# Patient Record
Sex: Male | Born: 1945 | Race: White | Hispanic: No | Marital: Married | Smoking: Current every day smoker
Health system: Southern US, Community
[De-identification: ages and names within clinical notes are randomized; demographics above are authoritative.]

## PROBLEM LIST (undated history)

## (undated) DIAGNOSIS — M1990 Unspecified osteoarthritis, unspecified site: Secondary | ICD-10-CM

## (undated) DIAGNOSIS — M519 Unspecified thoracic, thoracolumbar and lumbosacral intervertebral disc disorder: Secondary | ICD-10-CM

## (undated) DIAGNOSIS — E7800 Pure hypercholesterolemia, unspecified: Secondary | ICD-10-CM

## (undated) DIAGNOSIS — N529 Male erectile dysfunction, unspecified: Secondary | ICD-10-CM

## (undated) DIAGNOSIS — J449 Chronic obstructive pulmonary disease, unspecified: Secondary | ICD-10-CM

## (undated) DIAGNOSIS — E119 Type 2 diabetes mellitus without complications: Secondary | ICD-10-CM

## (undated) DIAGNOSIS — F329 Major depressive disorder, single episode, unspecified: Secondary | ICD-10-CM

## (undated) DIAGNOSIS — I10 Essential (primary) hypertension: Secondary | ICD-10-CM

## (undated) DIAGNOSIS — F32A Depression, unspecified: Secondary | ICD-10-CM

## (undated) DIAGNOSIS — R0600 Dyspnea, unspecified: Secondary | ICD-10-CM

## (undated) DIAGNOSIS — G629 Polyneuropathy, unspecified: Secondary | ICD-10-CM

## (undated) HISTORY — DX: Polyneuropathy, unspecified: G62.9

## (undated) HISTORY — DX: Unspecified thoracic, thoracolumbar and lumbosacral intervertebral disc disorder: M51.9

## (undated) HISTORY — DX: Pure hypercholesterolemia, unspecified: E78.00

## (undated) HISTORY — PX: KNEE SURGERY: SHX244

## (undated) HISTORY — DX: Essential (primary) hypertension: I10

## (undated) HISTORY — PX: ANTERIOR CERVICAL DECOMP/DISCECTOMY FUSION: SHX1161

## (undated) HISTORY — PX: HEMORRHOID SURGERY: SHX153

## (undated) HISTORY — PX: JOINT REPLACEMENT: SHX530

## (undated) HISTORY — PX: APPENDECTOMY: SHX54

## (undated) HISTORY — PX: TOTAL KNEE ARTHROPLASTY: SHX125

## (undated) HISTORY — DX: Male erectile dysfunction, unspecified: N52.9

---

## 1990-07-22 HISTORY — PX: OTHER SURGICAL HISTORY: SHX169

## 1998-02-10 ENCOUNTER — Inpatient Hospital Stay (HOSPITAL_COMMUNITY): Admission: RE | Admit: 1998-02-10 | Discharge: 1998-02-12 | Payer: Self-pay | Admitting: Specialist

## 1999-01-30 ENCOUNTER — Encounter: Payer: Self-pay | Admitting: Specialist

## 1999-02-02 ENCOUNTER — Inpatient Hospital Stay (HOSPITAL_COMMUNITY): Admission: RE | Admit: 1999-02-02 | Discharge: 1999-02-07 | Payer: Self-pay | Admitting: Specialist

## 1999-02-02 ENCOUNTER — Encounter: Payer: Self-pay | Admitting: Specialist

## 1999-02-09 ENCOUNTER — Ambulatory Visit: Admission: RE | Admit: 1999-02-09 | Discharge: 1999-02-09 | Payer: Self-pay | Admitting: Specialist

## 1999-02-28 ENCOUNTER — Encounter: Admission: RE | Admit: 1999-02-28 | Discharge: 1999-05-29 | Payer: Self-pay | Admitting: Specialist

## 1999-03-05 ENCOUNTER — Ambulatory Visit (HOSPITAL_COMMUNITY): Admission: RE | Admit: 1999-03-05 | Discharge: 1999-03-05 | Payer: Self-pay | Admitting: Specialist

## 2000-01-09 ENCOUNTER — Encounter: Payer: Self-pay | Admitting: Specialist

## 2000-01-09 ENCOUNTER — Ambulatory Visit (HOSPITAL_COMMUNITY): Admission: RE | Admit: 2000-01-09 | Discharge: 2000-01-09 | Payer: Self-pay | Admitting: Specialist

## 2000-01-14 ENCOUNTER — Ambulatory Visit (HOSPITAL_COMMUNITY): Admission: RE | Admit: 2000-01-14 | Discharge: 2000-01-14 | Payer: Self-pay | Admitting: Specialist

## 2000-01-14 ENCOUNTER — Encounter: Payer: Self-pay | Admitting: Specialist

## 2001-01-02 ENCOUNTER — Encounter: Admission: RE | Admit: 2001-01-02 | Discharge: 2001-01-29 | Payer: Self-pay | Admitting: Orthopaedic Surgery

## 2002-11-15 ENCOUNTER — Emergency Department (HOSPITAL_COMMUNITY): Admission: EM | Admit: 2002-11-15 | Discharge: 2002-11-15 | Payer: Self-pay | Admitting: Emergency Medicine

## 2002-11-15 ENCOUNTER — Encounter: Payer: Self-pay | Admitting: Emergency Medicine

## 2002-12-30 ENCOUNTER — Ambulatory Visit (HOSPITAL_COMMUNITY): Admission: RE | Admit: 2002-12-30 | Discharge: 2002-12-30 | Payer: Self-pay | Admitting: General Surgery

## 2002-12-30 ENCOUNTER — Encounter: Payer: Self-pay | Admitting: General Surgery

## 2003-08-24 ENCOUNTER — Ambulatory Visit (HOSPITAL_COMMUNITY): Admission: RE | Admit: 2003-08-24 | Discharge: 2003-08-24 | Payer: Self-pay | Admitting: Neurology

## 2006-05-16 ENCOUNTER — Emergency Department (HOSPITAL_COMMUNITY): Admission: EM | Admit: 2006-05-16 | Discharge: 2006-05-16 | Payer: Self-pay | Admitting: Family Medicine

## 2006-11-23 ENCOUNTER — Inpatient Hospital Stay (HOSPITAL_COMMUNITY): Admission: EM | Admit: 2006-11-23 | Discharge: 2006-11-26 | Payer: Self-pay | Admitting: Emergency Medicine

## 2006-11-24 ENCOUNTER — Encounter (INDEPENDENT_AMBULATORY_CARE_PROVIDER_SITE_OTHER): Payer: Self-pay | Admitting: Specialist

## 2007-08-24 ENCOUNTER — Encounter: Admission: RE | Admit: 2007-08-24 | Discharge: 2007-08-24 | Payer: Self-pay | Admitting: Cardiology

## 2008-07-22 DIAGNOSIS — E119 Type 2 diabetes mellitus without complications: Secondary | ICD-10-CM

## 2008-07-22 HISTORY — DX: Type 2 diabetes mellitus without complications: E11.9

## 2009-12-29 ENCOUNTER — Encounter: Admission: RE | Admit: 2009-12-29 | Discharge: 2009-12-29 | Payer: Self-pay | Admitting: Cardiology

## 2010-01-02 ENCOUNTER — Encounter: Admission: RE | Admit: 2010-01-02 | Discharge: 2010-01-02 | Payer: Self-pay | Admitting: Cardiology

## 2010-01-20 ENCOUNTER — Encounter: Admission: RE | Admit: 2010-01-20 | Discharge: 2010-01-20 | Payer: Self-pay | Admitting: Cardiology

## 2010-04-20 ENCOUNTER — Ambulatory Visit: Payer: Self-pay | Admitting: Cardiology

## 2010-04-30 ENCOUNTER — Ambulatory Visit: Payer: Self-pay | Admitting: Cardiology

## 2010-09-07 ENCOUNTER — Ambulatory Visit (INDEPENDENT_AMBULATORY_CARE_PROVIDER_SITE_OTHER): Payer: Medicare Other | Admitting: Cardiology

## 2010-09-07 DIAGNOSIS — Z79899 Other long term (current) drug therapy: Secondary | ICD-10-CM

## 2010-09-07 DIAGNOSIS — E119 Type 2 diabetes mellitus without complications: Secondary | ICD-10-CM

## 2010-09-07 DIAGNOSIS — E7800 Pure hypercholesterolemia, unspecified: Secondary | ICD-10-CM

## 2010-11-16 ENCOUNTER — Telehealth: Payer: Self-pay | Admitting: Cardiology

## 2010-11-16 NOTE — Telephone Encounter (Signed)
Pt called wants refill Digoxin  Furosemide and Hydrocodone 90 day supply faxed to [facility] [phone]

## 2010-11-20 ENCOUNTER — Other Ambulatory Visit: Payer: Self-pay | Admitting: *Deleted

## 2010-11-20 DIAGNOSIS — R52 Pain, unspecified: Secondary | ICD-10-CM

## 2010-11-20 DIAGNOSIS — R002 Palpitations: Secondary | ICD-10-CM

## 2010-11-20 DIAGNOSIS — I10 Essential (primary) hypertension: Secondary | ICD-10-CM

## 2010-11-20 MED ORDER — FUROSEMIDE 40 MG PO TABS: 40.0000 mg | ORAL_TABLET | Freq: Every day | ORAL | Status: DC | PRN

## 2010-11-20 MED ORDER — DIGOXIN 250 MCG PO TABS: 250.0000 ug | ORAL_TABLET | Freq: Every day | ORAL | Status: DC

## 2010-11-20 MED ORDER — HYDROCODONE-ACETAMINOPHEN 5-500 MG PO TABS: 1.0000 | ORAL_TABLET | Freq: Two times a day (BID) | ORAL | Status: DC | PRN

## 2010-11-20 NOTE — Telephone Encounter (Signed)
Patient called requesting rx's be phone into [facility] for digoxin, furosemide, and hydrocodne.  Faxed rxs to [facility]

## 2010-12-07 NOTE — Op Note (Signed)
NAMELUNDY, COZART             ACCOUNT NO.:  [id number]   MEDICAL RECORD NO.:  [id number]          PATIENT TYPE:  INP   LOCATION:  5727                         FACILITY:  [facility]   PHYSICIAN:  Ardeth Sportsman, MD     DATE OF BIRTH:  [birth year]   DATE OF PROCEDURE:  DATE OF DISCHARGE:                               OPERATIVE REPORT   PRIMARY CARE PHYSICIAN:  Dr. Cassell Clement.   SURGEON:  Dr. Karie Soda.   PREOPERATIVE DIAGNOSIS:  Acute appendicitis.   POSTOPERATIVE DIAGNOSES:  Perforated appendicitis with some gangrene,  fecalith and fecal contamination.   PROCEDURE PERFORMED:  1. Laparoscopic lysis of adhesions x30 minutes.  2. Laparoscopic appendectomy.   SPECIMENS:  Appendix.   DRAINS:  A 15-French Blake drain rests along the right pericolic gutter  into the pelvis.   ESTIMATED BLOOD LOSS:  Less than 20 mL.   COMPLICATIONS:  No major complications.   ANESTHESIA:  1. General anesthesia.  2. Local anesthetic and a field block around all port sites.   INDICATIONS:  The patient is a 65 year old male with a 48-hour history  of worsening abdominal pain and anorexia and nausea with history, exam &  CT scan strongly concerning for appendicitis.   The anatomy and physiology of the digestive tract was explained and the  pathophysiology of appendicitis with its natural history risks was  discussed.  Options were discussed, and the recommendation was made for  diagnostic laparoscopy with appendectomy.  The risks such as stroke,  heart attack, deep venous thrombosis, pulmonary embolism, and death were  discussed.  The risks such as bleeding, need for transfusion, wound  infection, abscess, injury to other organs, leak, enterocutaneous  fistula, other diagnoses, prolonged pain, prolonged ileus, incisional  hernia, and other risks were discussed.  Questions were answered, and he  agreed to proceed.   OPERATIVE FINDINGS:  He had a very thickened, inflamed appendix that  had  a perforation at the tip with necrosis near the base shredded off, but  the lowest base of the cecum seemed to be only mildly inflamed.  He had  purulent fluid collections down in his pelvis and his right lower  quadrant around his appendix and very small bowel interloop abscesses as  well.   DESCRIPTION OF THE PROCEDURE PERFORMED:  Consent was confirmed.  The  patient received IV Zosyn just prior to induction.  He had sequential  compression devices active during the entire case.  He had a Foley  catheter placed.  He was positioned supine with the left arm tucked.  He  underwent general anesthesia without difficulty.  His abdomen was  prepped and draped in a sterile fashion.   Entry was gained in the abdomen.  With the patient in steep reverse  Trendelenburg, right side up, using a 5-mm, 0-degree scope using optical  entry.  Capnoperitoneum 15 mm of mercury for good abdominal  insufflation.  Under direct visualization, a 5-mm port was placed in the  umbilicus and a 12-mm port was placed in the left lower quadrant.   Camera inspection findings consistent with the above with frank  pus in  the right upper quadrant, pelvis, and interloops.  Lysis of adhesions  was done to help break up the small bowel interloop abscesses and help  reduce the small bowel out of the pelvis to better irrigate.  The  appendix was densely adherent onto the right pelvic brim and anterior  abdominal wall.  The proximal right colon and cecum and appendix were  mobilized in a lateral-to-medial fashion using a Harmonic scalpel at the  line of Toldt and doing mostly careful blunt dissection to help mobilize  the cecum, terminal ileum, and appendix off the pelvic brim.  Care was  taken to stay superficial and avoid any injury to the ureter on that  side.  The appendix was densely inflamed with a fold of Aileen Pilot trying to  wall the area off along with some intestine.  Ultimately I was able to  free off the loops  of intestine and get them off the bowel.  A Harmonic  scalpel was used to help isolate and ligate the appendiceal mesentery.  In trying to elevate the appendix, about 3/4 of it evulsed.  I was able  to use the Harmonic to help skeletonize the remaining appendiceal base.  At the very proximal appendix as it exited from the cecum, it actually  did not seem too involved.  I was able to get a laparoscopic stapler  across this with good result with a nice, healthy staple line.   The appendix, appendiceal stump, fat, and some obvious balls of stool  and a probable fecalith were all placed in an EndoCatch bag and removed,  and on first attempt the bag ruptured.  On the second attempt after some  extra dilation, the bag was able to be brought out very easily.  Copious  irrigation of over 4 liters was done between the small bowel loops,  pelvis, right pericolic gutter, as well as Morison pouch in the right  liver to nice, clear return at the end.  Staple line was inspected and  there was no bleeding and no leaking of bile, and the staples seemed  nice and intact.  The 15-French Harrison Mons drain was placed as noted above.  Capnoperitoneum was evacuated.  The facial defect in the left lower  quadrant was closed using a 0-Vicryl in a figure-of-eight fashion with  good result.  Copious irrigation was done at all port sites, especially  in the left lower quadrant where the appendix was removed.  The skin was  reapproximated using 4-0 Monocryl stitch.  A sterile dressing was  applied.  The patient was extubated and sent to the recovery room in  stable condition.   I explained the operative findings to the patient's family.  Questions  were answered and they expressed their understanding and appreciation.      Ardeth Sportsman, MD  Electronically Signed     SCG/MEDQ  D:  11/24/2006  T:  11/24/2006  Job:  [id number]   cc:   Cassell Clement, M.D.

## 2010-12-07 NOTE — H&P (Signed)
NAMEELEX, MAINWARING NO.:  [id number]   MEDICAL RECORD NO.:  [id number]          PATIENT TYPE:  EMS   LOCATION:  MAJO                         FACILITY:  [facility]   PHYSICIAN:  Ardeth Sportsman, MD     DATE OF BIRTH:  January 27, 1946   DATE OF ADMISSION:  DATE OF DISCHARGE:                              HISTORY & PHYSICAL   PRIMARY CARE PHYSICIAN:  Dr. Cassell Clement, MD.   SURGEON:  Ardeth Sportsman, MD.   REASON FOR CONSULTATION:  Probable appendicitis.   HISTORY OF PRESENT ILLNESS:  Mr. Pheasant is a 65 year old gentleman  without any prior history of any abdominal complaints.  He has a 2-day  history of worsening abdominal pain.  He noted it tended to be focused  in the right lower quadrant, and got more intense today.  Somewhat in  the suprapubic region as well.  He has had decreased appetite and some  nausea, but no actual emesis.  No sick contacts or travel history.  He  normally has a bowel movement about every other day, with no bouts of  hematochezia, melena, or hematemesis.  He actually had a colonoscopy  done by [facility] about 3 weeks ago that was completely  normal.  He did not have any problems after that.   There is some question of him having some abdominal discomfort about 4  weeks ago, but it was mild and self-resolving, and it was not related to  eating or any other activity.  He came to the emergency room  tonight,  and he has been having some fevers and chills, and obvious sweats.  He  had a temperature of up around 101 at home.  He has a temperature of  101.1 here in the ER.  Emergency saw him for 10/10 pain, and no surgical  consultation was made, for concern of appendicitis.   PAST MEDICAL HISTORY:  1. Hypertension.  2. Dementia, with some short-term memory loss, improved on Aricept.  3. Hypercholesterolemia.  4. Question of seizure disorder in the past.  5. Colon polyps in the distant past, but he had a colonoscopy done 3   weeks ago.  That is completely normal.   SURGICAL HISTORY:  He has had knee surgery, but no abdominal surgeries.   SOCIAL HISTORY:  Positive for tobacco abuse, probably about a 30 pack-  year history of tobacco.  No alcohol or illicit drug use.  He is here  with his family at the bedside.   ALLERGIES:  CODEINE.   MEDICATION:  1. Aricept 10 mg daily.  2. Depakote extended release.  3. Valium 5 mg daily.  4. Vytorin 10/40 one p.o. daily.  5. Digoxin 250 mcg daily.  6. Hydrochlorothiazide 25 mg daily.  7. Trazodone nightly p.r.n.  Dose is not exactly recalled at this      time.   REVIEW OF SYSTEMS:  As noted per HPI.  GENERAL:  With fevers, chills and  sweats.  PSYCH:  He has had some short-term memory loss, but no  dementia,  psychosis, or paranoia.  NEUROLOGICAL:  No resting or  intention tremors, otherwise negative.  CARDIAC:  He has had some  dyspnea on exertion after walking a couple blocks and up a flight of  stairs, but no exertional chest pain or claudication or significant jaw  or neck pain or soreness with exertion.  GI:  As noted above.  Otherwise  negative.  GU, testicular, hepatic, renal, endocrine, musculoskeletal,  otherwise negative.  Neurologic, heme, lymph and allergic otherwise  negative.   PHYSICAL EXAMINATION:  VITAL SIGNS:  Temperature 101.1, pulse has been  in the 80s, respirations 16.  Systolic blood pressure is 120s-140s.  GENERAL:  He is a well-developed, well-nourished male, sweaty and  uncomfortable but not frankly toxic.  PSYCH:  He is pleasant and interactive.  He has some issues of short-  term memory.  Some evidence of mild dementia, but no evidence of any  psychosis, delirium, or paranoia.  HEENT:  His pupils are equal, round, and reactive to light.  Extraocular  movements are intact.  Sclerae are anicteric or injected.  He is wearing  glasses.  ENT:  He is normocephalic, but no facial asymmetry.  Mucous  membranes are dry, but nasopharynx and  oropharynx are clear.  NECK:  His neck is supple without any masses.  Trachea is midline.  Thyroid appears to be normal.  HEART:  Regular rate and rhythm.  No murmurs, clicks or rubs.  CHEST:  Clear to auscultation bilaterally.  No wheezes, rales, or  rhonchi.  No pain on rib or sternal compression.  ABDOMEN:  The abdomen is overweight, but soft.  He is moderately  distended.  He has exquisite tenderness over McBurney's point in the  right lower quadrant, as well as tenderness to the suprapubic region.  He does not seem particularly tender in his right upper quadrant or left  side of his abdomen.  GENITAL:  Normal external male genitalia.  Testes descended bilaterally.  No evidence of any hernias.  RECTAL:  Deferred, per patient request and especially since it is  negative colostomy, I did not push the issue.  MUSCULOSKELETAL:  Full range of motion of shoulders as well as wrists,  as well as hips, knees and ankles.  EXTREMITIES:  No clubbing or cyanosis.  No significant edema.  VASCULAR:  No carotid bruits.  Normal radial and dorsalis pedis pulses.  No spider angiomata or telangiectasia changes.  SKIN:  No sores or lesions.  LYMPHS:  No head, neck, axillary or groin lymphadenopathy.   STUDIES:  He has a white count of 10.6, with a left shift.  Hemoglobin  of 15, INR is 1, albumin of 3.1.  LFTs are otherwise negative.  Lipase  is normal.   CHEMISTRIES:  His sodium is 133, potassium 4, creatinine is 1.   RADIOLOGY:  Chest x-ray is completely normal.  CT scan of the abdomen  and pelvis shows a thickened blind loop structure leaving the cecum,  consistent with appendicitis.  The cecum appears to be normal, although  it does have some complex material (?stool) in it.  This may be a small  dot of perforation, but no major loculated fluid collection.  He does  have a little bit of  pelvic fluid and some stranding going down the pelvis.  The tip does go down in toward the pelvis.    ASSESSMENT AND PLAN:  41. A 65 year old male with negative colonoscopy and classic history of      physical exam and CT scan concerning for appendicitis.  2. The anatomy and  physiology of the GI tract was explained.      Pathophysiology of appendicitis explained.  Natural history was      explained.  Options were discussed and recommendation was made for      a diagnostic laparoscopy with appendectomy.  3. Risks, such as stroke or heart attack, deep vein thrombosis,      pulmonary embolism and death discussed.  Risk such as bleeding,      need for transfusion, wound infection, abscess, injury to other      organs, prolonged pain, prolonged hospitalization with ileus and      drainage, and other risks were discussed in detail.  Questions      answered.  He and his family agreed to proceed.      Ardeth Sportsman, MD  Electronically Signed     SCG/MEDQ  D:  11/23/2006  T:  11/24/2006  Job:  [id number]

## 2010-12-07 NOTE — Discharge Summary (Signed)
NAMETAKEEM, KROTZER             ACCOUNT NO.:  [id number]   MEDICAL RECORD NO.:  [id number]          PATIENT TYPE:  INP   LOCATION:  5727                         FACILITY:  [facility]   PHYSICIAN:  Leonie Man, M.D.   DATE OF BIRTH:  05/29/1946   DATE OF ADMISSION:  11/23/2006  DATE OF DISCHARGE:  11/26/2006                               DISCHARGE SUMMARY   CHIEF COMPLAINT AND REASON FOR ADMISSION:  Mr. Walth is a 65-year-  old male patient with history of hypertension and dementia.  No prior  abdominal surgeries.  He has developed worsening abdominal pain over the  past 2 days prior to admission, lower abdomen, suprapubic region; now  more focal to the right lower quadrant.  He has had anorexia and nausea  and vomiting.  He had not had a BM by the time he presented to the ER.  He had a normal colonoscopy 3 weeks ago.  In the ER, his initial white  count was elevated at 10,600 with neutrophil count of 86%.  A CT of the  abdomen and pelvis was performed and was consistent with appendicitis  with possible small perf; no definite abscess seen on CT.   The patient was admitted with the following diagnosis:  Acute appendicitis and perforation.   HOSPITAL COURSE:  The patient was taken directly from the emergency  department to the OR where he underwent diagnostic laparoscopy with  laparoscopic appendectomy for perforated acute appendicitis with fecal  contamination and suppuration.  He also had extensive adhesions that  took 30 minutes to achieve enterolysis.  The patient was stable in the  PACU and sent to the general surgical floor to recover.  He was started  on empiric Zosyn for antibiotics.   On postop day #1/2, the patient's white count had normalized to 4500,  hemoglobin was 12.4, platelets had dropped from 147,000 to 108,000.  The  patient's blood sugar was also somewhat elevated at 236.  His blood  pressure was also low for him; his normal blood pressure is high in the  130-160's.  Before normal blood pressure medications, his pressure was  running in the 80's to 90's.  Based on these findings, it was felt that  the patient had SIRS early sepsis and therefore was kept on IV fluid  hydration, IV antibiotics and close monitoring.   In regards to his abdomen, his abdomen was soft and distended with no  bowel sounds, but he was hungry, so therefore he was allowed sips of  clear liquid diet.   By postop day #1, the patient's abdomen was soft, still distended.  He  was passing bowel movements and he had taken in 1320 mL of sips of clear  liquids.  His JP initially had a large volume of serous fluid out 295  mL.  His platelet count had further dropped to 102,000.  His white count  was stable at 6900.  His blood pressure was still low in the 90-100  range and his sugar had come down to 114.  A hemoglobin A1c was checked  and this was slightly elevated at 6.5, but  it is felt that this time  that the patient's elevated glucose was more of a stress response due to  pre-septic condition.   On postop day #1, the patient was started on a full liquid diet with  plans to advance to a regular diet that afternoon if tolerated.   By postop day #2, the patient's condition had improved greatly.  He  continued to be able to ambulate.  His blood pressure at this point had  returned into the normal ranges with systolic blood pressure being 841-  140.  He is tolerating a solid diet.  He only had about 20 mL out of his  JP.  His platelet count had increased up to 156,000.  His hemoglobin was  12.2 and white count was 8500.  His dressings were removed.  JP drain  was left in place.  A left lower quadrant stab wound incision had been  packed in the intraoperative period.  This packing was removed with  removal of the dressing.  A volume of serous fluid was returned without  purulence.  There was no peri-wound redness and after the packing was  removed, I was unable to open the  wound enough to reintroduce packing,  therefore, a plain dressing was applied above the wound.  The patient  otherwise was deemed appropriate for discharge home pending Dr. Danella Penton  evaluation.   Final pathology report demonstrated no malignancy.  He had acute  supparative appendicitis with associated periappendiceal fat necrosis  and an infarcted appendix epiploica.   FINAL DISCHARGE DIAGNOSES:  1. Acute appendicitis with perforation, fecal contamination and      suppuration.  2. Hyperglycemia, thrombocytopenia and hypotension secondary to      systemic infection response syndrome (SIRS), now resolved.  3. Hypertension, controlled.  4. Dementia, stable this hospitalization.   DISCHARGE MEDICATIONS:  1. Augmentin 875 mg b.i.d. for 7 days.  2. Percocet 5/325 one to two tabs every 4 hours as needed for pain.  3. Over-the-counter Motrin and ibuprofen as needed.  4. Aricept 10 mg daily.  5. Depakote 500 mg 1-1/2 tabs daily.  6. Trazodone.  Dose as before to help sleep.  7. Hydrochlorothiazide 25 mg daily.  Hold for the next 2 days unless      the top number on your blood pressure is greater than 170 or your      bottom number is greater than 95.  8. Digoxin 250 mcg daily.  9. Vytorin 10/40 daily.  10.Valium 5 mg daily.   DISCHARGE DIET:  Low sodium, heart healthy.   ACTIVITY:  Increase activity slowly.  May walk up steps.  French bathe  until JP drain is removed, then may shower.  No lifting greater than 15  pounds for the next 2 weeks.  No driving while taking Percocet.   WOUND CARE:  Empty JP drain daily and record amount.  Bring this amount  recorded to followup appointments with physician.  Change the dressing  at the JP drain daily or as needed.  Cover the left abdominal wound with  a dry dressing; change as needed if area continues to drain.   ADDITIONAL INSTRUCTIONS: 1. Call the surgeon of any fever greater than or equal to 101 degrees      Fahrenheit.  2. New or  increased belly pain.  3. Redness or change in drainage from the wounds or the JP drain.  4. Nausea, vomiting or diarrhea.   FOLLOWUP APPOINTMENTS:  You are to see Dr. Michaell Cowing  on May 15 at 2:30 p.m.  Home Health RN will be following the patient at home regarding assisting  the family with management of the JP drain and watching the left lower  quadrant wound which is currently draining serous fluid.      Allison L. Rennis Harding, N.P.      Leonie Man, M.D.  Electronically Signed    ALE/MEDQ  D:  11/26/2006  T:  11/26/2006  Job:  [id number]   cc:   Ardeth Sportsman, MD  Cassell Clement, M.D.  [facility]

## 2011-02-04 ENCOUNTER — Other Ambulatory Visit: Payer: Self-pay | Admitting: Cardiology

## 2011-02-04 DIAGNOSIS — I119 Hypertensive heart disease without heart failure: Secondary | ICD-10-CM

## 2011-02-04 DIAGNOSIS — E785 Hyperlipidemia, unspecified: Secondary | ICD-10-CM

## 2011-02-04 DIAGNOSIS — E119 Type 2 diabetes mellitus without complications: Secondary | ICD-10-CM

## 2011-02-27 ENCOUNTER — Encounter: Payer: Self-pay | Admitting: Cardiology

## 2011-04-09 ENCOUNTER — Other Ambulatory Visit: Payer: Self-pay | Admitting: Cardiology

## 2011-04-09 DIAGNOSIS — E119 Type 2 diabetes mellitus without complications: Secondary | ICD-10-CM

## 2011-04-12 ENCOUNTER — Encounter: Payer: Self-pay | Admitting: Cardiology

## 2011-04-16 ENCOUNTER — Other Ambulatory Visit: Payer: Self-pay | Admitting: Cardiology

## 2011-04-16 DIAGNOSIS — M62838 Other muscle spasm: Secondary | ICD-10-CM

## 2011-04-16 MED ORDER — CYCLOBENZAPRINE HCL 10 MG PO TABS: 10.0000 mg | ORAL_TABLET | Freq: Three times a day (TID) | ORAL | Status: DC | PRN

## 2011-04-16 NOTE — Telephone Encounter (Signed)
done

## 2011-04-16 NOTE — Telephone Encounter (Signed)
Pt wants refill cyclobenzaprine 10mg 

## 2011-04-23 ENCOUNTER — Ambulatory Visit (HOSPITAL_COMMUNITY)
Admission: RE | Admit: 2011-04-23 | Discharge: 2011-04-23 | Disposition: A | Discharge status: Home / Self Care | Payer: Medicare Other | Source: Ambulatory Visit | Attending: Neurological Surgery | Admitting: Neurological Surgery

## 2011-04-23 ENCOUNTER — Other Ambulatory Visit (HOSPITAL_COMMUNITY): Payer: Self-pay | Admitting: Neurological Surgery

## 2011-04-23 ENCOUNTER — Encounter (HOSPITAL_COMMUNITY)
Admission: RE | Admit: 2011-04-23 | Discharge: 2011-04-23 | Disposition: A | Discharge status: Home / Self Care | Payer: Medicare Other | Source: Ambulatory Visit | Attending: Neurological Surgery | Admitting: Neurological Surgery

## 2011-04-23 DIAGNOSIS — Z01818 Encounter for other preprocedural examination: Secondary | ICD-10-CM | POA: Insufficient documentation

## 2011-04-23 DIAGNOSIS — M4712 Other spondylosis with myelopathy, cervical region: Secondary | ICD-10-CM

## 2011-04-23 DIAGNOSIS — Z01810 Encounter for preprocedural cardiovascular examination: Secondary | ICD-10-CM | POA: Insufficient documentation

## 2011-04-23 DIAGNOSIS — Z01812 Encounter for preprocedural laboratory examination: Secondary | ICD-10-CM | POA: Insufficient documentation

## 2011-04-23 LAB — BASIC METABOLIC PANEL
BUN: 7 mg/dL (ref 6–23)
Creatinine, Ser: 0.91 mg/dL (ref 0.50–1.35)
GFR calc Af Amer: >90 mL/min (ref >90)
GFR calc non Af Amer: 86 mL/min — ABNORMAL LOW (ref >90)

## 2011-04-23 LAB — CBC
HCT: 41.6 % (ref 39.0–52.0)
MCHC: 35.8 g/dL (ref 30.0–36.0)
MCV: 94.5 fL (ref 78.0–100.0)
RDW: 12.2 % (ref 11.5–15.5)

## 2011-04-25 ENCOUNTER — Other Ambulatory Visit: Payer: Medicare Other | Admitting: *Deleted

## 2011-04-25 ENCOUNTER — Inpatient Hospital Stay (HOSPITAL_COMMUNITY)
Admission: RE | Admit: 2011-04-25 | Discharge: 2011-04-26 | DRG: 473 | Disposition: A | Discharge status: Home / Self Care | Payer: Medicare Other | Source: Ambulatory Visit | Attending: Neurological Surgery | Admitting: Neurological Surgery

## 2011-04-25 ENCOUNTER — Observation Stay (HOSPITAL_COMMUNITY): Payer: Medicare Other

## 2011-04-25 DIAGNOSIS — F17200 Nicotine dependence, unspecified, uncomplicated: Secondary | ICD-10-CM | POA: Diagnosis present

## 2011-04-25 DIAGNOSIS — F0390 Unspecified dementia without behavioral disturbance: Secondary | ICD-10-CM | POA: Diagnosis present

## 2011-04-25 DIAGNOSIS — Z01810 Encounter for preprocedural cardiovascular examination: Secondary | ICD-10-CM

## 2011-04-25 DIAGNOSIS — E119 Type 2 diabetes mellitus without complications: Secondary | ICD-10-CM | POA: Diagnosis present

## 2011-04-25 DIAGNOSIS — I10 Essential (primary) hypertension: Secondary | ICD-10-CM | POA: Diagnosis present

## 2011-04-25 DIAGNOSIS — M4712 Other spondylosis with myelopathy, cervical region: Principal | ICD-10-CM | POA: Diagnosis present

## 2011-04-25 DIAGNOSIS — Z01812 Encounter for preprocedural laboratory examination: Secondary | ICD-10-CM

## 2011-04-25 DIAGNOSIS — Z01818 Encounter for other preprocedural examination: Secondary | ICD-10-CM

## 2011-04-25 DIAGNOSIS — Z981 Arthrodesis status: Secondary | ICD-10-CM

## 2011-04-25 LAB — GLUCOSE, CAPILLARY

## 2011-04-26 ENCOUNTER — Ambulatory Visit: Payer: Medicare Other | Admitting: Cardiology

## 2011-04-26 LAB — GLUCOSE, CAPILLARY
Glucose-Capillary: 106 mg/dL — ABNORMAL HIGH (ref 70–99)
Glucose-Capillary: 233 mg/dL — ABNORMAL HIGH (ref 70–99)

## 2011-05-22 NOTE — Op Note (Signed)
NAMEWESTLEE, DEVITA             ACCOUNT NO.:  [id number]  MEDICAL RECORD NO.:  [id number]  LOCATION:  3535                         FACILITY:  [facility]  PHYSICIAN:  Stefani Dama, M.D.  DATE OF BIRTH:  04-20-1946  DATE OF PROCEDURE:  04/25/2011 DATE OF DISCHARGE:                              OPERATIVE REPORT   PREOPERATIVE DIAGNOSES: 1. 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 2. Status post anterior cervical decompression arthrodesis C5-C6 in     1995.  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  OPERATIONS:  Anterior cervical decompression C3-4 and C4-5, arthrodesis with structural allograft C3-4 and C4-5, Alphatec plate fixation with Trestle plate Q6-V7.  SURGEON:  Stefani Dama, MD  FIRST ASSISTANT:  Cristi Loron, MD  ANESTHESIA:  General endotracheal.  INDICATIONS:  Alexander Booth is a 65 year old individual who has had progressive problems with neck, shoulder, and left arm pain.  He notes that the pain in his neck will radiate into his left shoulder and notes it radiates across the trapezius region of the supraspinatus muscle into the proximal aspect of the arm.  Does not have any dysesthesias, numbness, tingling, or burning into the hand.  He has been dealing with this problem, it has been unrelenting.  He had his shoulder evaluated, it was felt that it might be a rotator cuff tear, but this was not worn out or found to be the case.  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  He is now taken to the operating room to undergo surgical decompression.  PROCEDURE IN DETAIL:  The patient was brought to the operating room, placed on a table in supine position after smooth induction of  general endotracheal anesthesia, he was placed 5 pounds of halter traction. Neck was prepped with alcohol and DuraPrep and draped in a sterile fashion.  Transverse incision was created in the neck and this was carried down through the platysma plane between sternocleidomastoid and strap muscles was dissected bluntly until the prevertebral space was reached.  The first identifiable disk space was noted to be C4-C5. Large ventral osteophytes were then cleared.  The longus coli muscle was stripped off either side of midline and self-retaining Caspar-type retractor was placed in the wound.  The C4-5 disk space was then opened widely.  It was removed with a combination of Kerrison rongeurs removing a significant quantity of severely degenerating desiccated disk material from within the space.  As the region of posterior longitudinal ligament was reached, there was noted to be a substantial osteophyte from the inferior margin body of C4.  This was drilled down and the dissection was carried into the large uncinate process spurs.  We then drilled out and removed.  This allowed for good decompression to the region of C5 nerve roots which were amply freed on both sides.  Hemostasis was then established with some bipolar cautery and some small pledgets of Gelfoam soaked in thrombin.  A 7-mm transgraft was then  sized and shaped to the appropriate configuration, placed into the interspace after being filled with demineralized bone matrix.  Attention was then turned to the C3-4 where a similar process was carried out, here large osteophytic overgrowth particularly on left side were encountered.  These were drilled down and allowed for good decompression of the common dural tube in the left C4 nerve root.  Right C4 nerve root was decompressed in a similar fashion.  A bone graft was placed into the individual bone graft spaces between C3-4 and C4-5 under fluoroscopic guidance and then a 34- mm standard  size Alphatec plate was applied to the ventral aspect of the vertebral bodies and secured with 4 locking 4 x 14 mm screws.  The surgical construct looked good on x-ray and the wound was irrigated copiously with antibiotic irrigating solution.  Once dryness and surgical site was established, no bleeding was noted.  Then the platysma was closed with 3-0 Vicryl in interrupted fashion, 3-0 Vicryl using subcuticular tissues.  Dermabond was placed on the skin.  The patient was taken to recovery room for further postoperative care.     Stefani Dama, M.D.     Merla Riches  D:  04/25/2011  T:  04/26/2011  Job:  [id number]  Electronically Signed by Barnett Abu M.D. on 05/22/2011 07:03:48 AM

## 2011-05-23 ENCOUNTER — Other Ambulatory Visit: Payer: Self-pay | Admitting: *Deleted

## 2011-05-23 DIAGNOSIS — M62838 Other muscle spasm: Secondary | ICD-10-CM

## 2011-05-23 MED ORDER — CYCLOBENZAPRINE HCL 10 MG PO TABS: 10.0000 mg | ORAL_TABLET | Freq: Three times a day (TID) | ORAL | Status: DC | PRN

## 2011-06-12 ENCOUNTER — Other Ambulatory Visit (INDEPENDENT_AMBULATORY_CARE_PROVIDER_SITE_OTHER): Payer: Medicare Other | Admitting: *Deleted

## 2011-06-12 ENCOUNTER — Other Ambulatory Visit: Payer: Self-pay | Admitting: Cardiology

## 2011-06-12 DIAGNOSIS — E119 Type 2 diabetes mellitus without complications: Secondary | ICD-10-CM

## 2011-06-12 DIAGNOSIS — E785 Hyperlipidemia, unspecified: Secondary | ICD-10-CM

## 2011-06-12 DIAGNOSIS — I119 Hypertensive heart disease without heart failure: Secondary | ICD-10-CM

## 2011-06-12 DIAGNOSIS — Z125 Encounter for screening for malignant neoplasm of prostate: Secondary | ICD-10-CM

## 2011-06-12 LAB — HEPATIC FUNCTION PANEL
AST: 16 U/L (ref 0–37)
Alkaline Phosphatase: 66 U/L (ref 39–117)
Bilirubin, Direct: 0.1 mg/dL (ref 0.0–0.3)
Total Bilirubin: 0.6 mg/dL (ref 0.3–1.2)

## 2011-06-12 LAB — LIPID PANEL
Cholesterol: 153 mg/dL (ref 0–200)
Total CHOL/HDL Ratio: 5

## 2011-06-12 LAB — BASIC METABOLIC PANEL
BUN: 7 mg/dL (ref 6–23)
CO2: 30 mEq/L (ref 19–32)
Calcium: 9.3 mg/dL (ref 8.4–10.5)
Creatinine, Ser: 0.8 mg/dL (ref 0.4–1.5)
Glucose, Bld: 94 mg/dL (ref 70–99)

## 2011-06-18 ENCOUNTER — Ambulatory Visit (INDEPENDENT_AMBULATORY_CARE_PROVIDER_SITE_OTHER): Payer: Medicare Other | Admitting: Cardiology

## 2011-06-18 ENCOUNTER — Encounter: Payer: Self-pay | Admitting: Cardiology

## 2011-06-18 VITALS — BP 127/76 | HR 68 | Ht 72.0 in | Wt 192.4 lb

## 2011-06-18 DIAGNOSIS — M62838 Other muscle spasm: Secondary | ICD-10-CM

## 2011-06-18 DIAGNOSIS — E119 Type 2 diabetes mellitus without complications: Secondary | ICD-10-CM

## 2011-06-18 DIAGNOSIS — R52 Pain, unspecified: Secondary | ICD-10-CM

## 2011-06-18 DIAGNOSIS — F0390 Unspecified dementia without behavioral disturbance: Secondary | ICD-10-CM

## 2011-06-18 DIAGNOSIS — N529 Male erectile dysfunction, unspecified: Secondary | ICD-10-CM

## 2011-06-18 DIAGNOSIS — B356 Tinea cruris: Secondary | ICD-10-CM

## 2011-06-18 DIAGNOSIS — E7800 Pure hypercholesterolemia, unspecified: Secondary | ICD-10-CM | POA: Insufficient documentation

## 2011-06-18 DIAGNOSIS — E118 Type 2 diabetes mellitus with unspecified complications: Secondary | ICD-10-CM | POA: Insufficient documentation

## 2011-06-18 DIAGNOSIS — I119 Hypertensive heart disease without heart failure: Secondary | ICD-10-CM

## 2011-06-18 DIAGNOSIS — M1990 Unspecified osteoarthritis, unspecified site: Secondary | ICD-10-CM

## 2011-06-18 MED ORDER — SIMVASTATIN 80 MG PO TABS: 80.0000 mg | ORAL_TABLET | Freq: Every day | ORAL | Status: DC

## 2011-06-18 MED ORDER — HYDROCODONE-ACETAMINOPHEN 5-500 MG PO TABS: 1.0000 | ORAL_TABLET | Freq: Two times a day (BID) | ORAL | Status: DC | PRN

## 2011-06-18 MED ORDER — SILDENAFIL CITRATE 50 MG PO TABS: 50.0000 mg | ORAL_TABLET | ORAL | Status: DC | PRN

## 2011-06-18 MED ORDER — CYCLOBENZAPRINE HCL 10 MG PO TABS: 10.0000 mg | ORAL_TABLET | Freq: Three times a day (TID) | ORAL | Status: DC | PRN

## 2011-06-18 MED ORDER — ECONAZOLE NITRATE 1 % EX CREA: TOPICAL_CREAM | Freq: Every day | CUTANEOUS | Status: AC

## 2011-06-18 NOTE — Patient Instructions (Signed)
Your physician recommends that you continue on your current medications as directed. Please refer to the Current Medication list given to you today.  Your physician recommends that you schedule a follow-up appointment in: 4 months with fasting labs (lp/bmet/hfp)  

## 2011-06-18 NOTE — Assessment & Plan Note (Signed)
The patient denies any recent chest pain or shortness of breath.  No symptoms of congestive heart failure.  No dizzy spells or syncope.

## 2011-06-18 NOTE — Progress Notes (Signed)
Alexander Booth Date of Birth:  1945/08/26 [facility] / [facility] [address]   [address] [place], [place]  [address] [phone]           Fax   [phone]  History of Present Illness: This pleasant 65 year old gentleman is seen for a scheduled followup office visit.  He has a past history of essential hypertension and a history of high cholesterol.  He also has presenile dementia.  He has had a lot of orthopedic problems.  Since we last saw him he has had cervical disc surgery by Dr. Danielle Dess.  The surgery has improved his symptoms but not limited been entirely.  Patient is not expressing any chest pain or shortness of breath.  Current Outpatient Prescriptions  Medication Sig Dispense Refill  . cyclobenzaprine (FLEXERIL) 10 MG tablet Take 1 tablet (10 mg total) by mouth 3 (three) times daily as needed for muscle spasms.  30 tablet  1  . digoxin (LANOXIN) 0.25 MG tablet Take 1 tablet (250 mcg total) by mouth daily.  90 tablet  3  . divalproex (DEPAKOTE) 500 MG EC tablet Take 500 mg by mouth at bedtime.        . donepezil (ARICEPT) 10 MG tablet Take 10 mg by mouth at bedtime.        . furosemide (LASIX) 40 MG tablet Take 1 tablet (40 mg total) by mouth daily as needed.  90 tablet  3  . hydrochlorothiazide (HYDRODIURIL) 25 MG tablet Take 12.5 mg by mouth daily.        Marland Kitchen HYDROcodone-acetaminophen (VICODIN) 5-500 MG per tablet Take 1 tablet by mouth 2 (two) times daily as needed for pain.  180 tablet  1  . metFORMIN (GLUCOPHAGE) 500 MG tablet TAKE 2 TABLETS BY MOUTH TWICE DAILY  120 tablet  5  . nitroGLYCERIN (NITROSTAT) 0.4 MG SL tablet Place 0.4 mg under the tongue as needed.        . sildenafil (VIAGRA) 50 MG tablet Take 1 tablet (50 mg total) by mouth as needed.  5 tablet  3  . TRAZODONE HCL PO Take by mouth at bedtime.        Marland Kitchen econazole nitrate 1 % cream Apply topically daily.  15 g  11  . simvastatin (ZOCOR) 80 MG tablet Take 1 tablet (80 mg total) by mouth at  bedtime.  90 tablet  3    Allergies  Allergen Reactions  . Codeine     Itch     Patient Active Problem List  Diagnoses  . Benign hypertensive heart disease without heart failure  . Diabetes mellitus without mention of complication  . Pure hypercholesterolemia  . Osteoarthritis  . Dementia, presenile    History  Smoking status  . Current Everyday Smoker  Smokeless tobacco  . Not on file    History  Alcohol Use No    Family History  Problem Relation Age of Onset  . Multiple myeloma Mother     Review of Systems: Constitutional: no fever chills diaphoresis or fatigue or change in weight.  Head and neck: no hearing loss, no epistaxis, no photophobia or visual disturbance. Respiratory: No cough, shortness of breath or wheezing. Cardiovascular: No chest pain peripheral edema, palpitations. Gastrointestinal: No abdominal distention, no abdominal pain, no change in bowel habits hematochezia or melena. Genitourinary: No dysuria, no frequency, no urgency, no nocturia. Musculoskeletal:No arthralgias, no back pain, no gait disturbance or myalgias. Neurological: No dizziness, no headaches, no numbness, no seizures, no syncope,  no weakness, no tremors. Hematologic: No lymphadenopathy, no easy bruising. Psychiatric: No confusion, no hallucinations, no sleep disturbance.    Physical Exam: Filed Vitals:   06/18/11 0935  BP: 127/76  Pulse: 68   The general appearance reveals a well-developed well-nourished gentleman in no distress.The head and neck exam reveals pupils equal and reactive.  Extraocular movements are full.  There is no scleral icterus.  The mouth and pharynx are normal.  The neck is supple.  The carotids reveal no bruits.  The jugular venous pressure is normal.  The  thyroid is not enlarged.  There is no lymphadenopathy.  The chest is clear to percussion and auscultation.  There are no rales or rhonchi.  Expansion of the chest is symmetrical.  The precordium is quiet.   The first heart sound is normal.  The second heart sound is physiologically split.  There is no murmur gallop rub or click.  There is no abnormal lift or heave.  The abdomen is soft and nontender.  The bowel sounds are normal.  The liver and spleen are not enlarged.  There are no abdominal masses.  There are no abdominal bruits.  Extremities reveal good pedal pulses.  There is no phlebitis or edema.  There is no cyanosis or clubbing.  Strength is normal and symmetrical in all extremities.  There is no lateralizing weakness.  There are no sensory deficits.  The skin is warm and dry.  There is no rash.    Assessment / Plan: Continue same medication the refill some of his medicines including Viagra and also a fungal cream that he uses for inguinal rash.  Recheck in 4 months for followup office visit in fasting lab.

## 2011-06-18 NOTE — Assessment & Plan Note (Signed)
The patient has had high cholesterol.  He is on simvastatin.  Is having no side effects from the statin therapy.

## 2011-06-18 NOTE — Assessment & Plan Note (Signed)
The patient has not been having any hypoglycemic spells relative to his diabetes

## 2011-07-04 ENCOUNTER — Telehealth: Payer: Self-pay | Admitting: Cardiology

## 2011-07-04 NOTE — Telephone Encounter (Signed)
New Msg: Pharmacy calling wanting to check if Dr. Patty Sermons is approving request of refills for flexiril, zocor, viagra, hydrocodone, and econazole nitrate. Please call back to discuss further.  Ref # [id number]

## 2011-07-04 NOTE — Telephone Encounter (Signed)
Spoke with pharmacist and verified Rx's came from this office

## 2011-07-08 ENCOUNTER — Telehealth: Payer: Self-pay | Admitting: Cardiology

## 2011-07-08 NOTE — Telephone Encounter (Signed)
Spoke with the pharmacy and the FDA recommends not starting at this dose and they had no record of him taking it.  Spoke with wife and he has been taking, for more than the six months they require calling on.  Advised ok to fill since he has been taking more than 6 months

## 2011-07-08 NOTE — Telephone Encounter (Signed)
Fu call Pt was calling back about simvastatin please call pt's wife back

## 2011-07-08 NOTE — Telephone Encounter (Signed)
New message:  Please call phar regarding the medication Simvastatin 80 mg.  FDA recommends 5-40 qd. Ref number is [id number].

## 2011-07-09 NOTE — Telephone Encounter (Signed)
Agree 

## 2011-10-15 ENCOUNTER — Encounter: Payer: Self-pay | Admitting: Cardiology

## 2011-10-15 ENCOUNTER — Ambulatory Visit (INDEPENDENT_AMBULATORY_CARE_PROVIDER_SITE_OTHER): Payer: Medicare Other | Admitting: Cardiology

## 2011-10-15 ENCOUNTER — Other Ambulatory Visit: Payer: Medicare Other

## 2011-10-15 VITALS — BP 128/70 | HR 64 | Ht 73.0 in | Wt 194.0 lb

## 2011-10-15 DIAGNOSIS — M1990 Unspecified osteoarthritis, unspecified site: Secondary | ICD-10-CM

## 2011-10-15 DIAGNOSIS — E7800 Pure hypercholesterolemia, unspecified: Secondary | ICD-10-CM

## 2011-10-15 DIAGNOSIS — I119 Hypertensive heart disease without heart failure: Secondary | ICD-10-CM

## 2011-10-15 DIAGNOSIS — E119 Type 2 diabetes mellitus without complications: Secondary | ICD-10-CM

## 2011-10-15 LAB — LIPID PANEL
HDL: 36.6 mg/dL — ABNORMAL LOW (ref >39.00)
Total CHOL/HDL Ratio: 4
VLDL: 50.4 mg/dL — ABNORMAL HIGH (ref 0.0–40.0)

## 2011-10-15 LAB — HEPATIC FUNCTION PANEL
Alkaline Phosphatase: 52 U/L (ref 39–117)
Bilirubin, Direct: 0.1 mg/dL (ref 0.0–0.3)

## 2011-10-15 LAB — BASIC METABOLIC PANEL
BUN: 7 mg/dL (ref 6–23)
CO2: 28 mEq/L (ref 19–32)
Chloride: 101 mEq/L (ref 96–112)
Creatinine, Ser: 0.8 mg/dL (ref 0.4–1.5)
Potassium: 3.9 mEq/L (ref 3.5–5.1)

## 2011-10-15 NOTE — Progress Notes (Signed)
Alexander Booth Date of Birth:  [birth year] [facility] [address] [place], [place]  [address] [phone]         Fax   [phone]  History of Present Illness: This pleasant 66 year old gentleman is seen for a scheduled four-month followup office visit.  He has a history of hypertension and hypercholesterolemia.  The patient also has a history of dementia and a history of diabetes mellitus.  His last visit he has not had any new cardiac symptoms.  He denies any chest pain or shortness of breath.  Patient has had occasional dizzy spells.  He sometimes gets lightheaded if he stands up quickly.  We checked his pressure today sitting and standing and found no orthostatic change.  Current Outpatient Prescriptions  Medication Sig Dispense Refill  . cyclobenzaprine (FLEXERIL) 10 MG tablet Take 1 tablet (10 mg total) by mouth 3 (three) times daily as needed for muscle spasms.  60 tablet  1  . digoxin (LANOXIN) 0.25 MG tablet Take 1 tablet (250 mcg total) by mouth daily.  90 tablet  3  . divalproex (DEPAKOTE) 500 MG EC tablet Take 500 mg by mouth at bedtime.        . donepezil (ARICEPT) 10 MG tablet Take 10 mg by mouth at bedtime.        Marland Kitchen econazole nitrate 1 % cream Apply topically daily.  15 g  11  . furosemide (LASIX) 40 MG tablet Take 1 tablet (40 mg total) by mouth daily as needed.  90 tablet  3  . HYDROcodone-acetaminophen (VICODIN) 5-500 MG per tablet Take 1 tablet by mouth 2 (two) times daily as needed for pain.  180 tablet  1  . metFORMIN (GLUCOPHAGE) 500 MG tablet TAKE 2 TABLETS BY MOUTH TWICE DAILY  120 tablet  5  . simvastatin (ZOCOR) 80 MG tablet Take 1 tablet (80 mg total) by mouth at bedtime.  90 tablet  3  . TRAZODONE HCL PO Take by mouth at bedtime.        . hydrochlorothiazide (HYDRODIURIL) 25 MG tablet Take 12.5 mg by mouth daily.        . nitroGLYCERIN (NITROSTAT) 0.4 MG SL tablet Place 0.4 mg under the tongue as needed.          Allergies    Allergen Reactions  . Codeine     Itch     Patient Active Problem List  Diagnoses  . Benign hypertensive heart disease without heart failure  . Diabetes mellitus without mention of complication  . Pure hypercholesterolemia  . Osteoarthritis  . Dementia, presenile    History  Smoking status  . Current Everyday Smoker  Smokeless tobacco  . Not on file    History  Alcohol Use No    Family History  Problem Relation Age of Onset  . Multiple myeloma Mother     Review of Systems: Constitutional: no fever chills diaphoresis or fatigue or change in weight.  Head and neck: no hearing loss, no epistaxis, no photophobia or visual disturbance. Respiratory: No cough, shortness of breath or wheezing. Cardiovascular: No chest pain peripheral edema, palpitations. Gastrointestinal: No abdominal distention, no abdominal pain, no change in bowel habits hematochezia or melena. Genitourinary: No dysuria, no frequency, no urgency, no nocturia. Musculoskeletal:No arthralgias, no back pain, no gait disturbance or myalgias. Neurological: No dizziness, no headaches, no numbness, no seizures, no syncope, no weakness, no tremors. Hematologic: No lymphadenopathy, no easy bruising. Psychiatric: No confusion, no hallucinations, no sleep disturbance.  Physical Exam: Filed Vitals:   10/15/11 0843  BP: 128/70  Pulse: 64   the general appearance reveals a well-developed well-nourished elderly gentleman in no distress.Pupils equal and reactive.   Extraocular Movements are full.  There is no scleral icterus.  The mouth and pharynx are normal.  The neck is supple.  The carotids reveal no bruits.  The jugular venous pressure is normal.  The thyroid is not enlarged.  There is no lymphadenopathy.  The chest is clear to percussion and auscultation. There are no rales or rhonchi. Expansion of the chest is symmetrical.  The precordium is quiet.  The first heart sound is normal.  The second heart sound is  physiologically split.  There is no murmur gallop rub or click.  There is no abnormal lift or heave.  The abdomen is soft and nontender. Bowel sounds are normal. The liver and spleen are not enlarged. There Are no abdominal masses. There are no bruits.  The pedal pulses are good.  There is no phlebitis or edema.  There is no cyanosis or clubbing. Strength is normal and symmetrical in all extremities.  There is no lateralizing weakness.  There are no sensory deficits.  The patient does have evidence of decreased memory and dementia.     Assessment / Plan: The patient is to continue same medication.  Recheck in 4 months for followup office visit lipid panel hepatic function panel nasal metabolic panel and hemoglobin N8G.  I have encouraged patient to try to do more walking exercise.  He also needs to lose weight.

## 2011-10-15 NOTE — Progress Notes (Signed)
Quick Note:  Please report to patient. The recent labs are stable. Continue same medication and careful diet.TGs too high. Watch sweets ______

## 2011-10-15 NOTE — Assessment & Plan Note (Signed)
The patient has not been expressing any hypoglycemic episodes. 

## 2011-10-15 NOTE — Assessment & Plan Note (Signed)
The patient continues to have a lot of problems with arthritis.  He has chronic pain in his back.  He has had previous back surgery.  He also has a lot of neck pain

## 2011-10-15 NOTE — Patient Instructions (Signed)
Will obtain labs today and call you with the results (lp,bmet.hfp)  Your physician recommends that you continue on your current medications as directed. Please refer to the Current Medication list given to you today.  Your physician wants you to follow-up in: 4 months You will receive a reminder letter in the mail two months in advance. If you don't receive a letter, please call our office to schedule the follow-up appointment.

## 2011-10-15 NOTE — Assessment & Plan Note (Signed)
The patient previously had been on Vytorin.  Because of the cost he is now on plain simvastatin 80 mg daily.  Her checking lab work today.  The patient is not having any myalgias from his simvastatin.

## 2011-10-17 ENCOUNTER — Telehealth: Payer: Self-pay | Admitting: *Deleted

## 2011-10-17 NOTE — Telephone Encounter (Signed)
Message copied by Burnell Blanks on Thu Oct 17, 2011  4:57 PM ------      Message from: Cassell Clement      Created: Tue Oct 15, 2011  9:03 PM       Please report to patient.  The recent labs are stable. Continue same medication and careful diet.TGs too high.  Watch sweets

## 2011-10-17 NOTE — Telephone Encounter (Signed)
Advised wife of labs 

## 2011-10-29 ENCOUNTER — Other Ambulatory Visit: Payer: Self-pay | Admitting: Cardiology

## 2011-11-04 ENCOUNTER — Other Ambulatory Visit: Payer: Self-pay | Admitting: Cardiology

## 2011-11-04 DIAGNOSIS — M1990 Unspecified osteoarthritis, unspecified site: Secondary | ICD-10-CM

## 2011-11-04 DIAGNOSIS — R52 Pain, unspecified: Secondary | ICD-10-CM

## 2011-11-04 DIAGNOSIS — M62838 Other muscle spasm: Secondary | ICD-10-CM

## 2011-11-04 DIAGNOSIS — R002 Palpitations: Secondary | ICD-10-CM

## 2011-11-04 MED ORDER — CYCLOBENZAPRINE HCL 10 MG PO TABS: 10.0000 mg | ORAL_TABLET | Freq: Three times a day (TID) | ORAL | Status: DC | PRN

## 2011-11-04 MED ORDER — SIMVASTATIN 80 MG PO TABS: 80.0000 mg | ORAL_TABLET | Freq: Every day | ORAL | Status: DC

## 2011-11-04 MED ORDER — HYDROCODONE-ACETAMINOPHEN 5-500 MG PO TABS: 1.0000 | ORAL_TABLET | Freq: Two times a day (BID) | ORAL | Status: DC | PRN

## 2011-11-04 MED ORDER — DIGOXIN 250 MCG PO TABS: 250.0000 ug | ORAL_TABLET | Freq: Every day | ORAL | Status: DC

## 2011-11-04 NOTE — Telephone Encounter (Signed)
Pt was in last month, simvastatin, cycloenzaprine, digoxin, and hydrocodone were to be called into [facility] during office visit end of last month, and as of today still not there, needs refilled asap

## 2011-11-11 ENCOUNTER — Other Ambulatory Visit: Payer: Self-pay | Admitting: *Deleted

## 2011-11-11 MED ORDER — TIZANIDINE HCL 2 MG PO CAPS: 2.0000 mg | ORAL_CAPSULE | Freq: Three times a day (TID) | ORAL | Status: DC

## 2011-11-11 NOTE — Telephone Encounter (Signed)
[facility] called spoke Barnabas Harries a pharmacist with [facility] to changed Flexeril 10 mg t.i.d  to Tizanidine 2 or 4 mg due to cost of the Flexeril.  Spoke with Dr. Patty Sermons he approved the Tizanidine 2 mg -- t.i.d, filled 90 day supply with 1 refill.

## 2011-11-11 NOTE — Telephone Encounter (Signed)
Agree with switching medication as noted.

## 2011-11-15 ENCOUNTER — Telehealth: Payer: Self-pay | Admitting: Cardiology

## 2011-11-15 DIAGNOSIS — R002 Palpitations: Secondary | ICD-10-CM

## 2011-11-15 MED ORDER — DIGOXIN 250 MCG PO TABS: 250.0000 ug | ORAL_TABLET | Freq: Every day | ORAL | Status: DC

## 2011-11-15 MED ORDER — NITROGLYCERIN 0.4 MG SL SUBL: 0.4000 mg | SUBLINGUAL_TABLET | SUBLINGUAL | Status: DC | PRN

## 2011-11-15 MED ORDER — SIMVASTATIN 80 MG PO TABS: 80.0000 mg | ORAL_TABLET | Freq: Every day | ORAL | Status: DC

## 2011-11-15 NOTE — Telephone Encounter (Signed)
Patient has 2 infected teeth and unable to get in with dentist who will pull until Monday.  Per daughter a dentist told him infected but wouldn't pull teeth.  Patient wanted to know if  Dr. Patty Sermons would Rx an antibiotic until he could get this taken care of on Monday. Will forward to  Dr. Patty Sermons for reveiw

## 2011-11-15 NOTE — Telephone Encounter (Signed)
Pt's dtr calling re question on meds

## 2011-11-15 NOTE — Telephone Encounter (Signed)
Advised daughter 

## 2011-11-15 NOTE — Telephone Encounter (Signed)
No I cannot do that.  There is an emergency dentist for the town that he should contact.  Otherwise go to urgent care.

## 2011-11-22 ENCOUNTER — Other Ambulatory Visit: Payer: Self-pay | Admitting: Cardiology

## 2011-11-22 MED ORDER — SIMVASTATIN 80 MG PO TABS: 80.0000 mg | ORAL_TABLET | Freq: Every day | ORAL | Status: DC

## 2011-11-22 NOTE — Telephone Encounter (Signed)
New msg Pt said request for simvastatin did not get to [facility] please send to fax number [phone]

## 2011-11-22 NOTE — Telephone Encounter (Signed)
Refilled simvastatin and notified patient

## 2011-11-25 ENCOUNTER — Telehealth: Payer: Self-pay

## 2011-11-25 NOTE — Telephone Encounter (Signed)
Received phone call from [facility] wanting to clarify simvastatin prescription.Pharmacist was told vytorin 10/80 was stopped due to cost and simvastatin 80 mg prescribed.

## 2011-12-25 ENCOUNTER — Other Ambulatory Visit: Payer: Self-pay | Admitting: *Deleted

## 2011-12-25 DIAGNOSIS — E119 Type 2 diabetes mellitus without complications: Secondary | ICD-10-CM

## 2011-12-25 MED ORDER — METFORMIN HCL 500 MG PO TABS: ORAL_TABLET | ORAL | Status: DC

## 2012-04-08 ENCOUNTER — Telehealth: Payer: Self-pay | Admitting: Cardiology

## 2012-04-08 NOTE — Telephone Encounter (Signed)
Pt calling requesting written rx for orthopedic shoes , call when ready

## 2012-04-08 NOTE — Telephone Encounter (Signed)
Per wife patient having a lot of burning in feet from his diabetes and  Dr. Patty Sermons is his PCP.  Advised wife would check with  Dr. Patty Sermons next week when he is back to see if ok to write Rx

## 2012-04-12 NOTE — Telephone Encounter (Signed)
Okay to write Rx for orthopedic shoes for his neuropathy.

## 2012-04-13 NOTE — Telephone Encounter (Signed)
Advised wife Rx up front

## 2012-05-06 ENCOUNTER — Telehealth: Payer: Self-pay | Admitting: *Deleted

## 2012-05-06 NOTE — Telephone Encounter (Signed)
Agree with plan 

## 2012-05-06 NOTE — Telephone Encounter (Signed)
Left message to call back about appointment

## 2012-05-08 NOTE — Telephone Encounter (Signed)
Left message to call back  

## 2012-05-08 NOTE — Telephone Encounter (Signed)
Patient called and received message, will be here next week

## 2012-05-14 ENCOUNTER — Encounter: Payer: Self-pay | Admitting: Cardiology

## 2012-05-14 ENCOUNTER — Telehealth: Payer: Self-pay | Admitting: *Deleted

## 2012-05-14 ENCOUNTER — Ambulatory Visit (INDEPENDENT_AMBULATORY_CARE_PROVIDER_SITE_OTHER)
Admission: RE | Admit: 2012-05-14 | Discharge: 2012-05-14 | Disposition: A | Discharge status: Home / Self Care | Payer: Medicare Other | Source: Ambulatory Visit | Attending: Cardiology | Admitting: Cardiology

## 2012-05-14 ENCOUNTER — Ambulatory Visit (INDEPENDENT_AMBULATORY_CARE_PROVIDER_SITE_OTHER): Payer: Medicare Other | Admitting: Cardiology

## 2012-05-14 VITALS — BP 118/74 | HR 56 | Ht 73.0 in | Wt 173.6 lb

## 2012-05-14 DIAGNOSIS — E13610 Other specified diabetes mellitus with diabetic neuropathic arthropathy: Secondary | ICD-10-CM

## 2012-05-14 DIAGNOSIS — R634 Abnormal weight loss: Secondary | ICD-10-CM | POA: Insufficient documentation

## 2012-05-14 DIAGNOSIS — E1349 Other specified diabetes mellitus with other diabetic neurological complication: Secondary | ICD-10-CM

## 2012-05-14 DIAGNOSIS — E119 Type 2 diabetes mellitus without complications: Secondary | ICD-10-CM

## 2012-05-14 DIAGNOSIS — E7800 Pure hypercholesterolemia, unspecified: Secondary | ICD-10-CM

## 2012-05-14 DIAGNOSIS — F0390 Unspecified dementia without behavioral disturbance: Secondary | ICD-10-CM

## 2012-05-14 DIAGNOSIS — I119 Hypertensive heart disease without heart failure: Secondary | ICD-10-CM

## 2012-05-14 NOTE — Assessment & Plan Note (Signed)
He denies any change in bowel habits.  Not aware of any hematochezia or melena.  No cough or sputum production.  His weight is down 21 pounds since March.  He states that Dr. love recently drew a lot of blood work on him.  He has not had a chest x-ray in the last year and we will get one to look for causes of weight loss

## 2012-05-14 NOTE — Assessment & Plan Note (Signed)
No chest pain or shortness of breath. 

## 2012-05-14 NOTE — Telephone Encounter (Signed)
Left message if results and asked that patient call back to let us know received

## 2012-05-14 NOTE — Telephone Encounter (Signed)
Message copied by Burnell Blanks on Thu May 14, 2012  3:24 PM ------      Message from: Cassell Clement      Created: Thu May 14, 2012  2:14 PM       Please report.  Chest x-ray is okay.  Okay to get shingles vaccine.

## 2012-05-14 NOTE — Assessment & Plan Note (Signed)
His dementia is worse and he is no longer able to drive because he gets lost.  His wife drove him to the visit today

## 2012-05-14 NOTE — Patient Instructions (Signed)
Will have you go for a chest xray and call with the results ([facility] across from [facility])  Your physician recommends that you continue on your current medications as directed. Please refer to the Current Medication list given to you today.  Your physician recommends that you schedule a follow-up appointment in: 4 months with fasting labs (lp/bmet/hfp/a1v)

## 2012-05-14 NOTE — Progress Notes (Signed)
Alexander Booth Date of Birth:  1945/10/22 [facility] [address] [place], [place]  [address] [phone]         Fax   [phone]  History of Present Illness: This pleasant 66 year old gentleman is seen for a scheduled four-month followup office visit.  He came in particularly today to discuss his problem with diabetic neuropathy and painful feet.  We are filling out a form for him to be able to get diabetic shoes.  He has diabetes with diabetic neuropathy and painful calluses. He has a history of hypertension and hypercholesterolemia. The patient also has a history of dementia and a history of diabetes mellitus. His last visit he has not had any new cardiac symptoms. He denies any chest pain or shortness of breath. Patient has had occasional dizzy spells. He sometimes gets lightheaded if he stands up quickly. We checked his pressure today sitting and standing and found no orthostatic change.  Since last visit he has also lost a lot of weight.  He is not eating much.  He has a poor appetite.  Weight is down 21 pounds since March   Current Outpatient Prescriptions  Medication Sig Dispense Refill  . digoxin (LANOXIN) 0.25 MG tablet Take 1 tablet (250 mcg total) by mouth daily.  90 tablet  3  . divalproex (DEPAKOTE) 500 MG EC tablet Take 500 mg by mouth at bedtime.        . donepezil (ARICEPT) 10 MG tablet Take 10 mg by mouth at bedtime.        Marland Kitchen econazole nitrate 1 % cream Apply topically daily.  15 g  11  . FLUZONE injection       . hydrochlorothiazide (HYDRODIURIL) 25 MG tablet Take 12.5 mg by mouth daily.        Marland Kitchen HYDROcodone-acetaminophen (VICODIN) 5-500 MG per tablet Take 1 tablet by mouth 2 (two) times daily as needed for pain.  180 tablet  1  . metFORMIN (GLUCOPHAGE) 500 MG tablet 2 tablets by mouth twice a day  120 tablet  5  . nitroGLYCERIN (NITROSTAT) 0.4 MG SL tablet Place 1 tablet (0.4 mg total) under the tongue as needed.  100 tablet  3  . simvastatin  (ZOCOR) 80 MG tablet Take 1 tablet (80 mg total) by mouth at bedtime.  90 tablet  3  . tizanidine (ZANAFLEX) 2 MG capsule Take 1 capsule (2 mg total) by mouth 3 (three) times daily.  270 capsule  1  . TRAZODONE HCL PO Take by mouth at bedtime.        Marland Kitchen DISCONTD: sildenafil (VIAGRA) 50 MG tablet Take 1 tablet (50 mg total) by mouth as needed.  5 tablet  3    Allergies  Allergen Reactions  . Codeine     Itch     Patient Active Problem List  Diagnosis  . Benign hypertensive heart disease without heart failure  . Diabetes mellitus without mention of complication  . Pure hypercholesterolemia  . Osteoarthritis  . Dementia, presenile  . Weight loss, unintentional  . 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 Current Every Day Smoker  Smokeless tobacco  . Not on file    History  Alcohol Use No    Family History  Problem Relation Age of Onset  . Multiple myeloma Mother     Review of Systems: Constitutional: no fever chills diaphoresis or fatigue or change in weight.  Head and neck: no hearing  loss, no epistaxis, no photophobia or visual disturbance. Respiratory: No cough, shortness of breath or wheezing. Cardiovascular: No chest pain peripheral edema, palpitations. Gastrointestinal: No abdominal distention, no abdominal pain, no change in bowel habits hematochezia or melena. Genitourinary: No dysuria, no frequency, no urgency, no nocturia. Musculoskeletal:No arthralgias, no back pain, no gait disturbance or myalgias. Neurological: No dizziness, no headaches, no numbness, no seizures, no syncope, no weakness, no tremors. Hematologic: No lymphadenopathy, no easy bruising. Psychiatric: No confusion, no hallucinations, no sleep disturbance.    Physical Exam: Filed Vitals:   05/14/12 1051  BP: 118/74  Pulse: 56   the general appearance reveals a thin tall gentleman in no distress.The head and neck exam reveals pupils equal and reactive.   Extraocular movements are full.  There is no scleral icterus.  The mouth and pharynx are normal.  The neck is supple.  The carotids reveal no bruits.  The jugular venous pressure is normal.  The  thyroid is not enlarged.  There is no lymphadenopathy.  The chest is clear to percussion and auscultation.  There are no rales or rhonchi.  Expansion of the chest is symmetrical.  The precordium is quiet.  The first heart sound is normal.  The second heart sound is physiologically split.  There is no murmur gallop rub or click.  There is no abnormal lift or heave.  The abdomen is soft and nontender.  The bowel sounds are normal.  The liver and spleen are not enlarged.  There are no abdominal masses.  There are no abdominal bruits.  Extremities reveal weak pedal pulses.  There is no phlebitis or edema.  There is no cyanosis or clubbing.  Strength is normal and symmetrical in all extremities.  There is no lateralizing weakness.  There is decreased sensation over the feet.  There are calluses on the bottom of his feet.  The skin is warm and dry.  There is no rash.     Assessment / Plan: Continue present medication.  Get chest x-ray.  If chest x-ray is okay I told him that he could get the shingles vaccine and I gave him a prescription for that. Recheck here in 4 months for followup office visit lipid panel basal metabolic panel hepatic function panel and A1c

## 2012-06-08 ENCOUNTER — Other Ambulatory Visit: Payer: Self-pay | Admitting: Cardiology

## 2012-06-08 DIAGNOSIS — M1990 Unspecified osteoarthritis, unspecified site: Secondary | ICD-10-CM

## 2012-06-08 DIAGNOSIS — R52 Pain, unspecified: Secondary | ICD-10-CM

## 2012-06-08 NOTE — Telephone Encounter (Signed)
Pt needs refill asap

## 2012-06-16 MED ORDER — HYDROCODONE-ACETAMINOPHEN 5-500 MG PO TABS: 1.0000 | ORAL_TABLET | Freq: Two times a day (BID) | ORAL | Status: DC | PRN

## 2012-06-16 NOTE — Telephone Encounter (Signed)
Will fax to [facility]

## 2012-07-16 ENCOUNTER — Other Ambulatory Visit: Payer: Self-pay

## 2012-07-16 DIAGNOSIS — M549 Dorsalgia, unspecified: Secondary | ICD-10-CM

## 2012-07-16 MED ORDER — TIZANIDINE HCL 2 MG PO CAPS: 2.0000 mg | ORAL_CAPSULE | Freq: Three times a day (TID) | ORAL | Status: DC

## 2012-08-27 ENCOUNTER — Other Ambulatory Visit: Payer: Self-pay | Admitting: Cardiology

## 2012-08-27 DIAGNOSIS — R52 Pain, unspecified: Secondary | ICD-10-CM

## 2012-08-27 DIAGNOSIS — M1990 Unspecified osteoarthritis, unspecified site: Secondary | ICD-10-CM

## 2012-08-27 NOTE — Telephone Encounter (Signed)
New Problem   Refill Request Pharmacy called stating Hydrocodone-acetaminophen 5/500 mg is discontinued and to call her for the alternative medication.

## 2012-08-28 MED ORDER — HYDROCODONE-ACETAMINOPHEN 5-325 MG PO TABS: 1.0000 | ORAL_TABLET | Freq: Two times a day (BID) | ORAL | Status: DC | PRN

## 2012-08-28 NOTE — Telephone Encounter (Signed)
Will forward to  Dr. Brackbill for review 

## 2012-08-28 NOTE — Telephone Encounter (Signed)
Called to mail order pharmacy

## 2012-08-28 NOTE — Telephone Encounter (Signed)
Switch to hydrocodone 5/325

## 2012-09-14 ENCOUNTER — Encounter: Payer: Self-pay | Admitting: Cardiology

## 2012-09-14 ENCOUNTER — Ambulatory Visit (INDEPENDENT_AMBULATORY_CARE_PROVIDER_SITE_OTHER): Payer: Medicare Other | Admitting: Cardiology

## 2012-09-14 ENCOUNTER — Ambulatory Visit (INDEPENDENT_AMBULATORY_CARE_PROVIDER_SITE_OTHER): Payer: Medicare Other | Admitting: *Deleted

## 2012-09-14 VITALS — BP 126/72 | HR 54 | Ht 72.0 in | Wt 179.6 lb

## 2012-09-14 DIAGNOSIS — E7800 Pure hypercholesterolemia, unspecified: Secondary | ICD-10-CM

## 2012-09-14 DIAGNOSIS — N401 Enlarged prostate with lower urinary tract symptoms: Secondary | ICD-10-CM

## 2012-09-14 DIAGNOSIS — M1990 Unspecified osteoarthritis, unspecified site: Secondary | ICD-10-CM

## 2012-09-14 DIAGNOSIS — I119 Hypertensive heart disease without heart failure: Secondary | ICD-10-CM

## 2012-09-14 DIAGNOSIS — F0390 Unspecified dementia without behavioral disturbance: Secondary | ICD-10-CM

## 2012-09-14 DIAGNOSIS — E119 Type 2 diabetes mellitus without complications: Secondary | ICD-10-CM

## 2012-09-14 LAB — LIPID PANEL
HDL: 34.6 mg/dL — ABNORMAL LOW (ref >39.00)
LDL Cholesterol: 55 mg/dL (ref 0–99)
Total CHOL/HDL Ratio: 3
Triglycerides: 141 mg/dL (ref 0.0–149.0)

## 2012-09-14 LAB — HEPATIC FUNCTION PANEL
Bilirubin, Direct: 0.1 mg/dL (ref 0.0–0.3)
Total Bilirubin: 0.6 mg/dL (ref 0.3–1.2)

## 2012-09-14 LAB — HEMOGLOBIN A1C: Hgb A1c MFr Bld: 5.7 % (ref 4.6–6.5)

## 2012-09-14 LAB — PSA: PSA: 0.45 ng/mL (ref 0.10–4.00)

## 2012-09-14 LAB — BASIC METABOLIC PANEL
BUN: 14 mg/dL (ref 6–23)
Calcium: 9.3 mg/dL (ref 8.4–10.5)
Creatinine, Ser: 0.9 mg/dL (ref 0.4–1.5)
GFR: 93.74 mL/min (ref >60.00)

## 2012-09-14 NOTE — Assessment & Plan Note (Signed)
Patient continues to have disabling back pain.  He tends to lose his balance easily.  He states that he is unable to walk much for exercise because of his foot pain and his back pain.

## 2012-09-14 NOTE — Assessment & Plan Note (Signed)
No chest pain or increased shortness of breath.  He does not get regular exercise.  He continues to smoke a half a pack of cigarettes a day against advice.

## 2012-09-14 NOTE — Progress Notes (Signed)
Alexander Booth Date of Birth:  07/05/1946 [facility] [address] [place], [place]  [address] [phone]         Fax   [phone]  History of Present Illness: This pleasant 67 year old gentleman is seen for a scheduled four-month followup office visit. Marland Kitchen He has a history of hypertension and hypercholesterolemia. The patient also has a history of dementia and a history of diabetes mellitus. His last visit he has not had any new cardiac symptoms. He denies any chest pain or shortness of breath. Patient has had occasional dizzy spells. He sometimes gets lightheaded if he stands up quickly.  The patient previously had had problems with unexplained weight loss but since last visit has actually been eating better and his weight is up 6 pounds.   Current Outpatient Prescriptions  Medication Sig Dispense Refill  . digoxin (LANOXIN) 0.25 MG tablet Take 1 tablet (250 mcg total) by mouth daily.  90 tablet  3  . divalproex (DEPAKOTE) 500 MG EC tablet Take 500 mg by mouth at bedtime.        . donepezil (ARICEPT) 10 MG tablet Take 10 mg by mouth at bedtime.        Marland Kitchen FLUoxetine (PROZAC) 10 MG capsule Take 10 mg by mouth daily.      Marland Kitchen FLUZONE injection       . hydrochlorothiazide (HYDRODIURIL) 25 MG tablet Take 12.5 mg by mouth daily.        Marland Kitchen HYDROcodone-acetaminophen (NORCO/VICODIN) 5-325 MG per tablet Take 1 tablet by mouth 2 (two) times daily as needed for pain.  180 tablet  1  . metFORMIN (GLUCOPHAGE) 500 MG tablet 2 tablets by mouth twice a day  120 tablet  5  . nitroGLYCERIN (NITROSTAT) 0.4 MG SL tablet Place 1 tablet (0.4 mg total) under the tongue as needed.  100 tablet  3  . simvastatin (ZOCOR) 80 MG tablet Take 1 tablet (80 mg total) by mouth at bedtime.  90 tablet  3  . tizanidine (ZANAFLEX) 2 MG capsule Take 1 capsule (2 mg total) by mouth 3 (three) times daily.  270 capsule  1  . TRAZODONE HCL PO Take by mouth at bedtime.        . [DISCONTINUED] sildenafil  (VIAGRA) 50 MG tablet Take 1 tablet (50 mg total) by mouth as needed.  5 tablet  3   No current facility-administered medications for this visit.    Allergies  Allergen Reactions  . Codeine     Itch     Patient Active Problem List  Diagnosis  . Benign hypertensive heart disease without heart failure  . Diabetes mellitus without mention of complication  . Pure hypercholesterolemia  . Osteoarthritis  . Dementia, presenile  . Weight loss, unintentional  . 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 Current Every Day Smoker  Smokeless tobacco  . Not on file    History  Alcohol Use No    Family History  Problem Relation Age of Onset  . Multiple myeloma Mother     Review of Systems: Constitutional: no fever chills diaphoresis or fatigue or change in weight.  Head and neck: no hearing loss, no epistaxis, no photophobia or visual disturbance. Respiratory: No cough, shortness of breath or wheezing. Cardiovascular: No chest pain peripheral edema, palpitations. Gastrointestinal: No abdominal distention, no abdominal pain, no change in bowel habits hematochezia or melena. Genitourinary: No dysuria, no frequency, no urgency, no nocturia. Musculoskeletal:No  arthralgias, no back pain, no gait disturbance or myalgias. Neurological: No dizziness, no headaches, no numbness, no seizures, no syncope, no weakness, no tremors. Hematologic: No lymphadenopathy, no easy bruising. Psychiatric: No confusion, no hallucinations, no sleep disturbance.    Physical Exam: Filed Vitals:   09/14/12 0856  BP: 126/72  Pulse: 54   the general appearance reveals a tall well-developed gentleman in no distress.The head and neck exam reveals pupils equal and reactive.  Extraocular movements are full.  There is no scleral icterus.  The mouth and pharynx are normal.  The neck is supple.  The carotids reveal no bruits.  The jugular venous pressure is normal.  The   thyroid is not enlarged.  There is no lymphadenopathy.  The chest is clear to percussion and auscultation.  There are no rales or rhonchi.  Expansion of the chest is symmetrical.  The precordium is quiet.  The first heart sound is normal.  The second heart sound is physiologically split.  There is no murmur gallop rub or click.  There is no abnormal lift or heave.  The abdomen is soft and nontender.  The bowel sounds are normal.  The liver and spleen are not enlarged.  There are no abdominal masses.  There are no abdominal bruits.  Extremities reveal weak pedal pulses.  There is no phlebitis or edema.  There is no cyanosis or clubbing.  Strength is normal and symmetrical in all extremities.  There is no lateralizing weakness.  There are no sensory deficits.  The skin is warm and dry.  There is no rash.  EKG shows sinus bradycardia and no acute changes and is unchanged from previous EKG of 04/23/11  Assessment / Plan: Continue same medication.  I urged him to quit smoking.  He has problems with decreased hearing as well as tinnitus and he will call his ENT physician Dr. Haroldine Laws. Recheck here in 4 months for followup office visit and lab work cleaning A1c

## 2012-09-14 NOTE — Assessment & Plan Note (Signed)
The patient has not been having any hypoglycemic episodes 

## 2012-09-14 NOTE — Progress Notes (Signed)
Quick Note:  Please report to patient. The recent labs are stable. Continue same medication and careful diet. A1C better. ______

## 2012-09-14 NOTE — Assessment & Plan Note (Signed)
His dementia is unchanged

## 2012-09-14 NOTE — Patient Instructions (Addendum)
Will obtain labs today and call you with the results (lp./bmet/hfp/a1c)  Your physician recommends that you continue on your current medications as directed. Please refer to the Current Medication list given to you today.  Your physician recommends that you schedule a follow-up appointment in: 4 months with fasting labs (lp/bmet/hfp/a1c)

## 2012-09-16 ENCOUNTER — Ambulatory Visit: Payer: Medicare Other | Attending: Otolaryngology | Admitting: Audiology

## 2012-09-16 ENCOUNTER — Telehealth: Payer: Self-pay | Admitting: *Deleted

## 2012-09-16 DIAGNOSIS — H918X9 Other specified hearing loss, unspecified ear: Secondary | ICD-10-CM | POA: Insufficient documentation

## 2012-09-16 DIAGNOSIS — H905 Unspecified sensorineural hearing loss: Secondary | ICD-10-CM | POA: Insufficient documentation

## 2012-09-16 NOTE — Telephone Encounter (Signed)
Message copied by Burnell Blanks on Wed Sep 16, 2012  9:53 AM ------      Message from: Cassell Clement      Created: Mon Sep 14, 2012  8:05 PM       PSA still normal. ------

## 2012-09-16 NOTE — Telephone Encounter (Signed)
Advised patient of lab results  

## 2012-09-16 NOTE — Telephone Encounter (Signed)
Message copied by Burnell Blanks on Wed Sep 16, 2012  9:53 AM ------      Message from: Cassell Clement      Created: Mon Sep 14, 2012  8:05 PM       Please report to patient.  The recent labs are stable. Continue same medication and careful diet. A1C better. ------

## 2012-10-13 ENCOUNTER — Telehealth: Payer: Self-pay | Admitting: Cardiology

## 2012-10-13 MED ORDER — AZITHROMYCIN 250 MG PO TABS: ORAL_TABLET | ORAL | Status: DC

## 2012-10-13 NOTE — Telephone Encounter (Signed)
Sent to pharmacy as requested 

## 2012-10-13 NOTE — Telephone Encounter (Signed)
Sore throat and coughing. Has been using Mucinex and is getting no better. Would like Rx sent to pharmacy. Will forward to  Dr. Patty Sermons for review

## 2012-10-13 NOTE — Telephone Encounter (Signed)
New problem   Pt wants to know if he can have dr Patty Sermons to call him in a zpk

## 2012-10-13 NOTE — Telephone Encounter (Signed)
Add a Z-Pak as directed

## 2012-11-24 ENCOUNTER — Other Ambulatory Visit: Payer: Self-pay | Admitting: Neurology

## 2012-11-24 DIAGNOSIS — R9409 Abnormal results of other function studies of central nervous system: Secondary | ICD-10-CM

## 2012-11-24 DIAGNOSIS — F329 Major depressive disorder, single episode, unspecified: Secondary | ICD-10-CM

## 2012-11-24 DIAGNOSIS — R269 Unspecified abnormalities of gait and mobility: Secondary | ICD-10-CM

## 2012-11-24 DIAGNOSIS — M79609 Pain in unspecified limb: Secondary | ICD-10-CM

## 2012-11-24 DIAGNOSIS — F0280 Dementia in other diseases classified elsewhere without behavioral disturbance: Secondary | ICD-10-CM

## 2012-11-24 DIAGNOSIS — E1142 Type 2 diabetes mellitus with diabetic polyneuropathy: Secondary | ICD-10-CM

## 2012-11-24 DIAGNOSIS — F3289 Other specified depressive episodes: Secondary | ICD-10-CM

## 2012-12-03 ENCOUNTER — Ambulatory Visit
Admission: RE | Admit: 2012-12-03 | Discharge: 2012-12-03 | Disposition: A | Discharge status: Home / Self Care | Payer: Medicare Other | Source: Ambulatory Visit | Attending: Neurology | Admitting: Neurology

## 2012-12-03 DIAGNOSIS — R9409 Abnormal results of other function studies of central nervous system: Secondary | ICD-10-CM

## 2012-12-03 DIAGNOSIS — R269 Unspecified abnormalities of gait and mobility: Secondary | ICD-10-CM

## 2012-12-03 DIAGNOSIS — M79609 Pain in unspecified limb: Secondary | ICD-10-CM

## 2012-12-03 DIAGNOSIS — E1142 Type 2 diabetes mellitus with diabetic polyneuropathy: Secondary | ICD-10-CM

## 2012-12-03 DIAGNOSIS — F0280 Dementia in other diseases classified elsewhere without behavioral disturbance: Secondary | ICD-10-CM

## 2012-12-03 DIAGNOSIS — F329 Major depressive disorder, single episode, unspecified: Secondary | ICD-10-CM

## 2012-12-03 MED ORDER — GADOBENATE DIMEGLUMINE 529 MG/ML IV SOLN
15.0000 mL | Freq: Once | INTRAVENOUS | Status: AC | PRN
  Administered 2012-12-03: 15 mL via INTRAVENOUS

## 2012-12-04 ENCOUNTER — Other Ambulatory Visit: Payer: Self-pay | Admitting: Neurological Surgery

## 2012-12-04 ENCOUNTER — Ambulatory Visit
Admission: RE | Admit: 2012-12-04 | Discharge: 2012-12-04 | Disposition: A | Discharge status: Home / Self Care | Payer: Medicare Other | Source: Ambulatory Visit | Attending: Neurological Surgery | Admitting: Neurological Surgery

## 2012-12-07 ENCOUNTER — Telehealth: Payer: Self-pay | Admitting: *Deleted

## 2012-12-07 NOTE — Progress Notes (Signed)
Quick Note:  Please call and advise the patient that the recent scan we did was without acute findings. We did a brain MRI with and without contrast, which showed a normal structure of the brain and no significant volume loss which we call atrophy. There were changes in the deeper structures of the brain, which we call white matter changes or microvascular changes. These are tiny white spots, that occur with time and are seen in a variety of conditions, including with normal aging, chronic hypertension, chronic headaches, chronic diabetes, chronic hyperlipidemia. These are not strokes and no mass or lesion or contrast enhancement was seen which is reassuring. Again, there were no acute findings, such as a stroke, or mass or blood products. No further action is required on this test at this time, other than re-enforcing the importance of good blood pressure control, good cholesterol control, good blood sugar control, and weight management. Please remind patient to keep any upcoming appointments or tests and to call us with any interim questions, concerns, problems or updates. Thanks,  Huston Foley, MD, PhD   ______

## 2012-12-07 NOTE — Telephone Encounter (Signed)
Message copied by Avie Echevaria on Mon Dec 07, 2012  4:38 PM ------      Message from: Huston Foley      Created: Mon Dec 07, 2012 11:35 AM       Please call and advise the patient that the recent scan we did was without acute findings. We did a brain MRI with and without contrast, which showed a normal structure of the brain and no significant volume loss which we call atrophy. There were changes in the deeper structures of the brain, which we call white matter changes or microvascular changes. These are tiny white spots, that occur with time and are seen in a variety of conditions, including with normal aging, chronic hypertension, chronic headaches, chronic diabetes, chronic hyperlipidemia. These are not strokes and no mass or lesion or contrast enhancement was seen which is reassuring. Again, there were no acute findings, such as a stroke, or mass or blood products. No further action is required on this test at this time, other than re-enforcing the importance of good blood pressure control, good cholesterol control, good blood sugar control, and weight management. Please remind patient to keep any upcoming appointments      or tests and to call us with any interim questions, concerns, problems or updates. Thanks,       Huston Foley, MD, PhD             ------

## 2012-12-07 NOTE — Telephone Encounter (Signed)
Busy signal

## 2012-12-08 ENCOUNTER — Encounter (HOSPITAL_COMMUNITY): Payer: Self-pay | Admitting: Pharmacy Technician

## 2012-12-08 ENCOUNTER — Other Ambulatory Visit: Payer: Self-pay | Admitting: Neurological Surgery

## 2012-12-09 ENCOUNTER — Encounter (HOSPITAL_COMMUNITY): Payer: Self-pay

## 2012-12-09 ENCOUNTER — Encounter (HOSPITAL_COMMUNITY)
Admission: RE | Admit: 2012-12-09 | Discharge: 2012-12-09 | Disposition: A | Discharge status: Home / Self Care | Payer: Medicare Other | Source: Ambulatory Visit | Attending: Neurological Surgery | Admitting: Neurological Surgery

## 2012-12-09 HISTORY — DX: Major depressive disorder, single episode, unspecified: F32.9

## 2012-12-09 HISTORY — DX: Depression, unspecified: F32.A

## 2012-12-09 HISTORY — DX: Type 2 diabetes mellitus without complications: E11.9

## 2012-12-09 LAB — CBC
MCH: 33.6 pg (ref 26.0–34.0)
MCV: 94.8 fL (ref 78.0–100.0)
Platelets: 131 10*3/uL — ABNORMAL LOW (ref 150–400)
RBC: 4.02 MIL/uL — ABNORMAL LOW (ref 4.22–5.81)

## 2012-12-09 LAB — BASIC METABOLIC PANEL
CO2: 28 mEq/L (ref 19–32)
Calcium: 9.1 mg/dL (ref 8.4–10.5)
Glucose, Bld: 87 mg/dL (ref 70–99)
Sodium: 137 mEq/L (ref 135–145)

## 2012-12-09 MED ORDER — CEFAZOLIN SODIUM-DEXTROSE 2-3 GM-% IV SOLR
2.0000 g | INTRAVENOUS | Status: AC
  Administered 2012-12-10: 2 g via INTRAVENOUS
  Filled 2012-12-09: qty 50

## 2012-12-09 NOTE — Pre-Procedure Instructions (Signed)
Alexander Booth [facility]  12/09/2012   Your procedure is scheduled on:  Thursday, May 22  Report to [facility] at 1:00 pM.  Call this number if you have problems the morning of surgery: [phone]   Remember:   Do not eat food or drink liquids after midnight. tonight   Take these medicines the morning of surgery with A SIP OF WATER: Digoxin,Aricept,Fluoxetine,Tizanidine, Pain medication if needed   Do not wear jewelry, make-up or nail polish.  Do not wear lotions, powders, or perfumes. You may wear deodorant.  Do not shave 48 hours prior to surgery. Men may shave face and neck.  Do not bring valuables to the hospital.  Contacts, dentures or bridgework may not be worn into surgery.  Leave suitcase in the car. After surgery it may be brought to your room.  For patients admitted to the hospital, checkout time is 11:00 AM the day of  discharge.   Patients discharged the day of surgery will not be allowed to drive  home.  Name and phone number of your driver:   Special Instructions: Shower using CHG 2 nights before surgery and the night before surgery.  If you shower the day of surgery use CHG.  Use special wash - you have one bottle of CHG for all showers.  You should use approximately 1/3 of the bottle for each shower.   Please read over the following fact sheets that you were given: Pain Booklet, Coughing and Deep Breathing and Surgical Site Infection Prevention

## 2012-12-10 ENCOUNTER — Encounter (HOSPITAL_COMMUNITY): Payer: Self-pay | Admitting: Anesthesiology

## 2012-12-10 ENCOUNTER — Ambulatory Visit (HOSPITAL_COMMUNITY): Payer: Medicare Other

## 2012-12-10 ENCOUNTER — Ambulatory Visit (HOSPITAL_COMMUNITY): Payer: Medicare Other | Admitting: Anesthesiology

## 2012-12-10 ENCOUNTER — Encounter (HOSPITAL_COMMUNITY)
Admission: RE | Disposition: A | Discharge status: Home / Self Care | Payer: Self-pay | Source: Ambulatory Visit | Attending: Neurological Surgery

## 2012-12-10 ENCOUNTER — Ambulatory Visit (HOSPITAL_COMMUNITY)
Admission: RE | Admit: 2012-12-10 | Discharge: 2012-12-10 | Disposition: A | Discharge status: Home / Self Care | Payer: Medicare Other | Source: Ambulatory Visit | Attending: Neurological Surgery | Admitting: Neurological Surgery

## 2012-12-10 DIAGNOSIS — F0393 Unspecified dementia, unspecified severity, with mood disturbance: Secondary | ICD-10-CM | POA: Insufficient documentation

## 2012-12-10 DIAGNOSIS — W11XXXA Fall on and from ladder, initial encounter: Secondary | ICD-10-CM | POA: Insufficient documentation

## 2012-12-10 DIAGNOSIS — I10 Essential (primary) hypertension: Secondary | ICD-10-CM | POA: Insufficient documentation

## 2012-12-10 DIAGNOSIS — E7800 Pure hypercholesterolemia, unspecified: Secondary | ICD-10-CM | POA: Insufficient documentation

## 2012-12-10 DIAGNOSIS — F0390 Unspecified dementia without behavioral disturbance: Secondary | ICD-10-CM | POA: Insufficient documentation

## 2012-12-10 DIAGNOSIS — S32019A Unspecified fracture of first lumbar vertebra, initial encounter for closed fracture: Secondary | ICD-10-CM

## 2012-12-10 DIAGNOSIS — S32009A Unspecified fracture of unspecified lumbar vertebra, initial encounter for closed fracture: Secondary | ICD-10-CM | POA: Insufficient documentation

## 2012-12-10 DIAGNOSIS — E119 Type 2 diabetes mellitus without complications: Secondary | ICD-10-CM | POA: Insufficient documentation

## 2012-12-10 HISTORY — PX: KYPHOPLASTY: SHX5884

## 2012-12-10 LAB — GLUCOSE, CAPILLARY: Glucose-Capillary: 71 mg/dL (ref 70–99)

## 2012-12-10 SURGERY — KYPHOPLASTY
Anesthesia: General | Site: Back | Wound class: Clean

## 2012-12-10 MED ORDER — OXYCODONE HCL 5 MG PO TABS
ORAL_TABLET | ORAL | Status: AC
  Administered 2012-12-10: 5 mg
  Filled 2012-12-10: qty 1

## 2012-12-10 MED ORDER — BUPIVACAINE HCL (PF) 0.25 % IJ SOLN
INTRAMUSCULAR | Status: DC | PRN
  Administered 2012-12-10: 1 mL

## 2012-12-10 MED ORDER — PROPOFOL 10 MG/ML IV BOLUS
INTRAVENOUS | Status: DC | PRN
  Administered 2012-12-10: 50 mg via INTRAVENOUS

## 2012-12-10 MED ORDER — ARTIFICIAL TEARS OP OINT
TOPICAL_OINTMENT | OPHTHALMIC | Status: DC | PRN
  Administered 2012-12-10: 1 via OPHTHALMIC

## 2012-12-10 MED ORDER — ACETAMINOPHEN 10 MG/ML IV SOLN
INTRAVENOUS | Status: AC
  Administered 2012-12-10: 1000 mg
  Filled 2012-12-10: qty 100

## 2012-12-10 MED ORDER — DEXTROSE 5 % IV SOLN
INTRAVENOUS | Status: DC | PRN
  Administered 2012-12-10: 17:00:00 via INTRAVENOUS

## 2012-12-10 MED ORDER — ROCURONIUM BROMIDE 100 MG/10ML IV SOLN
INTRAVENOUS | Status: DC | PRN
  Administered 2012-12-10: 25 mg via INTRAVENOUS
  Administered 2012-12-10: 15 mg via INTRAVENOUS

## 2012-12-10 MED ORDER — FENTANYL CITRATE 0.05 MG/ML IJ SOLN
25.0000 ug | INTRAMUSCULAR | Status: DC | PRN
  Administered 2012-12-10: 50 ug via INTRAVENOUS
  Administered 2012-12-10: 25 ug via INTRAVENOUS
  Administered 2012-12-10: 50 ug via INTRAVENOUS
  Administered 2012-12-10: 25 ug via INTRAVENOUS

## 2012-12-10 MED ORDER — FENTANYL CITRATE 0.05 MG/ML IJ SOLN
INTRAMUSCULAR | Status: DC | PRN
  Administered 2012-12-10 (×3): 100 ug via INTRAVENOUS

## 2012-12-10 MED ORDER — FENTANYL CITRATE 0.05 MG/ML IJ SOLN
INTRAMUSCULAR | Status: AC
  Filled 2012-12-10: qty 2

## 2012-12-10 MED ORDER — LIDOCAINE-EPINEPHRINE 1 %-1:100000 IJ SOLN
INTRAMUSCULAR | Status: DC | PRN
  Administered 2012-12-10: 2 mL
  Administered 2012-12-10: 1 mL

## 2012-12-10 MED ORDER — GLYCOPYRROLATE 0.2 MG/ML IJ SOLN
INTRAMUSCULAR | Status: DC | PRN
  Administered 2012-12-10: 0.4 mg via INTRAVENOUS
  Administered 2012-12-10: 0.6 mg via INTRAVENOUS

## 2012-12-10 MED ORDER — OXYCODONE HCL 5 MG/5ML PO SOLN: 5.0000 mg | Freq: Once | ORAL | Status: DC | PRN

## 2012-12-10 MED ORDER — LIDOCAINE HCL (CARDIAC) 20 MG/ML IV SOLN
INTRAVENOUS | Status: DC | PRN
  Administered 2012-12-10: 80 mg via INTRAVENOUS
  Administered 2012-12-10: 20 mg via INTRAVENOUS

## 2012-12-10 MED ORDER — ONDANSETRON HCL 4 MG/2ML IJ SOLN: 4.0000 mg | Freq: Four times a day (QID) | INTRAMUSCULAR | Status: DC | PRN

## 2012-12-10 MED ORDER — IOHEXOL 300 MG/ML  SOLN
INTRAMUSCULAR | Status: DC | PRN
  Administered 2012-12-10: 50 mL

## 2012-12-10 MED ORDER — LACTATED RINGERS IV SOLN
INTRAVENOUS | Status: DC | PRN
  Administered 2012-12-10 (×2): via INTRAVENOUS

## 2012-12-10 MED ORDER — KETOROLAC TROMETHAMINE 30 MG/ML IJ SOLN
INTRAMUSCULAR | Status: AC
  Administered 2012-12-10: 15 mg
  Filled 2012-12-10: qty 1

## 2012-12-10 MED ORDER — DEXAMETHASONE SODIUM PHOSPHATE 4 MG/ML IJ SOLN
INTRAMUSCULAR | Status: DC | PRN
  Administered 2012-12-10: 8 mg via INTRAVENOUS

## 2012-12-10 MED ORDER — OXYCODONE HCL 5 MG PO TABS: 5.0000 mg | ORAL_TABLET | Freq: Once | ORAL | Status: DC | PRN

## 2012-12-10 MED ORDER — ONDANSETRON HCL 4 MG/2ML IJ SOLN
INTRAMUSCULAR | Status: DC | PRN
  Administered 2012-12-10: 4 mg via INTRAVENOUS

## 2012-12-10 MED ORDER — KETOROLAC TROMETHAMINE 15 MG/ML IJ SOLN: 15.0000 mg | Freq: Four times a day (QID) | INTRAMUSCULAR | Status: DC

## 2012-12-10 MED ORDER — NEOSTIGMINE METHYLSULFATE 1 MG/ML IJ SOLN
INTRAMUSCULAR | Status: DC | PRN
  Administered 2012-12-10: 5 mg via INTRAVENOUS

## 2012-12-10 SURGICAL SUPPLY — 48 items
ADH SKN CLS APL DERMABOND .7 (GAUZE/BANDAGES/DRESSINGS) ×1
BANDAGE ADHESIVE 1X3 (GAUZE/BANDAGES/DRESSINGS) ×2 IMPLANT
BLADE SURG 11 STRL SS (BLADE) ×2 IMPLANT
BLADE SURG ROTATE 9660 (MISCELLANEOUS) IMPLANT
CEMENT BONE KYPHX HV R (Orthopedic Implant) ×1 IMPLANT
CEMENT KYPHON C01A KIT/MIXER (Cement) ×1 IMPLANT
CLOTH BEACON ORANGE TIMEOUT ST (SAFETY) ×2 IMPLANT
CONT SPEC 4OZ CLIKSEAL STRL BL (MISCELLANEOUS) ×3 IMPLANT
DECANTER SPIKE VIAL GLASS SM (MISCELLANEOUS) ×2 IMPLANT
DERMABOND ADVANCED (GAUZE/BANDAGES/DRESSINGS) ×1
DERMABOND ADVANCED .7 DNX12 (GAUZE/BANDAGES/DRESSINGS) IMPLANT
DRAPE C-ARM 42X72 X-RAY (DRAPES) ×2 IMPLANT
DRAPE INCISE IOBAN 66X45 STRL (DRAPES) ×2 IMPLANT
DRAPE LAPAROTOMY 100X72X124 (DRAPES) ×2 IMPLANT
DRAPE PROXIMA HALF (DRAPES) ×1 IMPLANT
DURAPREP 26ML APPLICATOR (WOUND CARE) ×2 IMPLANT
GAUZE SPONGE 4X4 16PLY XRAY LF (GAUZE/BANDAGES/DRESSINGS) ×2 IMPLANT
GLOVE BIO SURGEON STRL SZ 6.5 (GLOVE) ×4 IMPLANT
GLOVE BIO SURGEON STRL SZ7.5 (GLOVE) IMPLANT
GLOVE BIOGEL PI IND STRL 6.5 (GLOVE) IMPLANT
GLOVE BIOGEL PI IND STRL 7.5 (GLOVE) IMPLANT
GLOVE BIOGEL PI IND STRL 8.5 (GLOVE) ×1 IMPLANT
GLOVE BIOGEL PI INDICATOR 6.5 (GLOVE) ×3
GLOVE BIOGEL PI INDICATOR 7.5 (GLOVE)
GLOVE BIOGEL PI INDICATOR 8.5 (GLOVE) ×1
GLOVE ECLIPSE 8.5 STRL (GLOVE) ×2 IMPLANT
GLOVE EXAM NITRILE LRG STRL (GLOVE) IMPLANT
GLOVE EXAM NITRILE MD LF STRL (GLOVE) IMPLANT
GLOVE EXAM NITRILE XL STR (GLOVE) IMPLANT
GLOVE EXAM NITRILE XS STR PU (GLOVE) IMPLANT
GOWN BRE IMP SLV AUR LG STRL (GOWN DISPOSABLE) ×2 IMPLANT
GOWN BRE IMP SLV AUR XL STRL (GOWN DISPOSABLE) ×1 IMPLANT
GOWN STRL REIN 2XL LVL4 (GOWN DISPOSABLE) ×2 IMPLANT
KIT BASIN OR (CUSTOM PROCEDURE TRAY) ×2 IMPLANT
KIT ROOM TURNOVER OR (KITS) ×2 IMPLANT
NDL HYPO 25X1 1.5 SAFETY (NEEDLE) ×1 IMPLANT
NEEDLE HYPO 25X1 1.5 SAFETY (NEEDLE) ×2 IMPLANT
NS IRRIG 1000ML POUR BTL (IV SOLUTION) ×2 IMPLANT
PACK SURGICAL SETUP 50X90 (CUSTOM PROCEDURE TRAY) ×2 IMPLANT
PAD ARMBOARD 7.5X6 YLW CONV (MISCELLANEOUS) ×6 IMPLANT
SPECIMEN JAR SMALL (MISCELLANEOUS) IMPLANT
SUT VIC AB 3-0 SH 8-18 (SUTURE) ×2 IMPLANT
SUT VIC AB 4-0 P-3 18X BRD (SUTURE) IMPLANT
SUT VIC AB 4-0 P3 18 (SUTURE)
SYR CONTROL 10ML LL (SYRINGE) ×3 IMPLANT
TOWEL OR 17X24 6PK STRL BLUE (TOWEL DISPOSABLE) ×2 IMPLANT
TOWEL OR 17X26 10 PK STRL BLUE (TOWEL DISPOSABLE) ×2 IMPLANT
TRAY KYPHOPAK 20/3 ONESTEP 1ST (MISCELLANEOUS) ×2 IMPLANT

## 2012-12-10 NOTE — Anesthesia Postprocedure Evaluation (Signed)
  Anesthesia Post-op Note  Patient: Alexander Booth [facility]  Procedure(s) Performed: Procedure(s) with comments: Lumbar one Kyphoplasty (N/A) - Lumbar One Kyphoplasty  Patient Location: PACU  Anesthesia Type:General  Level of Consciousness: awake, alert  and oriented  Airway and Oxygen Therapy: Patient Spontanous Breathing  Post-op Pain: none  Post-op Assessment: Post-op Vital signs reviewed, Patient's Cardiovascular Status Stable, Respiratory Function Stable, Patent Airway and Pain level controlled  Post-op Vital Signs: stable  Complications: No apparent anesthesia complications

## 2012-12-10 NOTE — Discharge Summary (Signed)
Physician Discharge Summary  Patient ID: Alexander Booth MRN: [id number] DOB/AGE: 1946/07/03 67 y.o.  Admit date: 12/10/2012 Discharge date: 12/10/2012  Admission Diagnoses: L1 vertebral fracture closed Discharge Diagnoses: L1 vertebral fracture, closed Principal Problem:   L1 vertebral fracture   Discharged Condition: good  Hospital Course: Patient was admitted to undergo acrylic balloon kyphoplasty which he tolerated well. The procedure was done as an outpatient.  Consults: None  Significant Diagnostic Studies: None  Treatments: surgery: Acrylic balloon kyphoplasty L1 vertebrae  Discharge Exam: Blood pressure 172/68, pulse 52, temperature 97 F (36.1 C), temperature source Oral, resp. rate 15, SpO2 99.00%. Incision clean and dry motor function intact  Disposition: 01-Home or Self Care  Discharge Orders   Future Appointments Provider Department Dept Phone   12/22/2012 3:30 PM Huston Foley, MD [facility] [phone]   01/05/2013 9:00 AM Cassell Clement, MD [facility] Main Office [place]) [phone]   01/05/2013 9:15 AM [facility] Main Office [place]) [phone]   Future Orders Complete By Expires     Call MD for:  redness, tenderness, or signs of infection (pain, swelling, redness, odor or green/yellow discharge around incision site)  As directed     Call MD for:  severe uncontrolled pain  As directed     Call MD for:  temperature >100.4  As directed     Diet - low sodium heart healthy  As directed     Increase activity slowly  As directed         Medication List    TAKE these medications       digoxin 0.25 MG tablet  Commonly known as:  LANOXIN  Take 0.25 mg by mouth daily.     divalproex 500 MG DR tablet  Commonly known as:  DEPAKOTE  Take 1,000 mg by mouth at bedtime.     donepezil 10 MG tablet  Commonly known as:  ARICEPT  Take 10 mg by mouth every morning.     FLUoxetine 10 MG capsule  Commonly known  as:  PROZAC  Take 10 mg by mouth daily.     hydrochlorothiazide 25 MG tablet  Commonly known as:  HYDRODIURIL  Take 12.5 mg by mouth daily.     HYDROcodone-acetaminophen 5-325 MG per tablet  Commonly known as:  NORCO/VICODIN  Take 1 tablet by mouth 2 (two) times daily as needed for pain.     metFORMIN 500 MG tablet  Commonly known as:  GLUCOPHAGE  Take 1,000 mg by mouth 2 (two) times daily with a meal.     nitroGLYCERIN 0.4 MG SL tablet  Commonly known as:  NITROSTAT  Place 0.4 mg under the tongue every 5 (five) minutes as needed for chest pain.     PARoxetine 20 MG tablet  Commonly known as:  PAXIL  Take 20 mg by mouth at bedtime.     simvastatin 80 MG tablet  Commonly known as:  ZOCOR  Take 80 mg by mouth every morning.     tizanidine 2 MG capsule  Commonly known as:  ZANAFLEX  Take 1 capsule (2 mg total) by mouth 3 (three) times daily.     traZODone 100 MG tablet  Commonly known as:  DESYREL  Take 200 mg by mouth at bedtime.         SignedStefani Dama 12/10/2012, 7:48 PM

## 2012-12-10 NOTE — Transfer of Care (Signed)
Immediate Anesthesia Transfer of Care Note  Patient: Alexander Booth [facility]  Procedure(s) Performed: Procedure(s) with comments: Lumbar one Kyphoplasty (N/A) - Lumbar One Kyphoplasty  Patient Location: PACU  Anesthesia Type:General  Level of Consciousness: sedated, patient cooperative and responds to stimulation  Airway & Oxygen Therapy: Patient Spontanous Breathing and Patient connected to nasal cannula oxygen  Post-op Assessment: Report given to PACU RN, Post -op Vital signs reviewed and stable, Patient moving all extremities and Patient moving all extremities X 4  Post vital signs: Reviewed and stable  Complications: No apparent anesthesia complications

## 2012-12-10 NOTE — Progress Notes (Signed)
Dr Noreene Larsson here to see pt. Aware SBP still in 190's, pain better per pt after IV Ofirmev. Will cont to monitor.

## 2012-12-10 NOTE — H&P (Signed)
Alexander Booth is an 67 y.o. male.   Chief Complaint: Back pain HPI: Patient is a 67 year old individual's had a fall from a ladder approximately 4 feet in height. Plan of his buttocks and side. He had immediate and severe centralized back pain. Plain x-rays demonstrated that he has evidence of significant spondylitic changes in lumbar spine but there is a superior endplate fracture of L1. Is not tolerated pain management very well over the past week and had a period of time. He denies regarding acrylic balloon kyphoplasty.  Past Medical History  Diagnosis Date  . Hypertension     Essential  . Presenile dementia   . Hypercholesterolemia   . Lumbosacral disc disease     with chronic low back pain  . Peripheral neuropathy   . Bronchitis     Subacute and hx of ongoing cigarette smoking one half pack daily  . Diabetes mellitus     He's also had a hx of high cholesterol   . Erectile dysfunction     He's had a hx of   . FHx: heart disease     Positive for   . Dementia     He has a hx of the   . Dyslipidemia     He also has a past hx of   . Peripheral neuropathy     He also has   . Depression   . Type 2 diabetes mellitus 2010    takes oral meds  . Fracture of lumbar vertebra 11/25/2012  . Hard of hearing     very little hearing on left side;better in right ear    Past Surgical History  Procedure Laterality Date  . Knee surgery      Previous left total replacement  . Hemorrhoid surgery      x2  . Other surgical history  1992    Bone Fusion in Right Foot  . Anterior cervical decomp/discectomy fusion    . Appendectomy    . Joint replacement    . Total knee arthroplasty Left     Family History  Problem Relation Age of Onset  . Multiple myeloma Mother    Social History:  reports that he has been smoking.  He has never used smokeless tobacco. He reports that he does not drink alcohol or use illicit drugs.  Allergies:  Allergies  Allergen Reactions  . Codeine     Itch      Medications Prior to Admission  Medication Sig Dispense Refill  . digoxin (LANOXIN) 0.25 MG tablet Take 0.25 mg by mouth daily.      . divalproex (DEPAKOTE) 500 MG EC tablet Take 1,000 mg by mouth at bedtime.       . donepezil (ARICEPT) 10 MG tablet Take 10 mg by mouth every morning.       Marland Kitchen FLUoxetine (PROZAC) 10 MG capsule Take 10 mg by mouth daily.      . hydrochlorothiazide (HYDRODIURIL) 25 MG tablet Take 12.5 mg by mouth daily.        Marland Kitchen HYDROcodone-acetaminophen (NORCO/VICODIN) 5-325 MG per tablet Take 1 tablet by mouth 2 (two) times daily as needed for pain.      . metFORMIN (GLUCOPHAGE) 500 MG tablet Take 1,000 mg by mouth 2 (two) times daily with a meal.      . nitroGLYCERIN (NITROSTAT) 0.4 MG SL tablet Place 0.4 mg under the tongue every 5 (five) minutes as needed for chest pain.      Marland Kitchen PARoxetine (PAXIL) 20  MG tablet Take 20 mg by mouth at bedtime.      . simvastatin (ZOCOR) 80 MG tablet Take 80 mg by mouth every morning.      . tizanidine (ZANAFLEX) 2 MG capsule Take 1 capsule (2 mg total) by mouth 3 (three) times daily.  270 capsule  1  . traZODone (DESYREL) 100 MG tablet Take 200 mg by mouth at bedtime.        Results for orders placed during the hospital encounter of 12/09/12 (from the past 48 hour(s))  SURGICAL PCR SCREEN     Status: None   Collection Time    12/09/12 11:51 AM      Result Value Range   MRSA, PCR NEGATIVE  NEGATIVE   Staphylococcus aureus NEGATIVE  NEGATIVE   Comment:            The Xpert SA Assay (FDA     approved for NASAL specimens     in patients over 77 years of age),     is one component of     a comprehensive surveillance     program.  Test performance has     been validated by [employer] for patients greater     than or equal to 18 year old.     It is not intended     to diagnose infection nor to     guide or monitor treatment.  BASIC METABOLIC PANEL     Status: Abnormal   Collection Time    12/09/12 11:53 AM      Result Value  Range   Sodium 137  135 - 145 mEq/L   Potassium 4.5  3.5 - 5.1 mEq/L   Chloride 99  96 - 112 mEq/L   CO2 28  19 - 32 mEq/L   Glucose, Bld 87  70 - 99 mg/dL   BUN 10  6 - 23 mg/dL   Creatinine, Ser 1.61  0.50 - 1.35 mg/dL   Calcium 9.1  8.4 - 09.6 mg/dL   GFR calc non Af Amer 86 (*) >90 mL/min   GFR calc Af Amer >90  >90 mL/min   Comment:            The eGFR has been calculated     using the CKD EPI equation.     This calculation has not been     validated in all clinical     situations.     eGFR's persistently     <90 mL/min signify     possible Chronic Kidney Disease.  CBC     Status: Abnormal   Collection Time    12/09/12 11:53 AM      Result Value Range   WBC 5.6  4.0 - 10.5 K/uL   RBC 4.02 (*) 4.22 - 5.81 MIL/uL   Hemoglobin 13.5  13.0 - 17.0 g/dL   HCT 04.5 (*) 40.9 - 81.1 %   MCV 94.8  78.0 - 100.0 fL   MCH 33.6  26.0 - 34.0 pg   MCHC 35.4  30.0 - 36.0 g/dL   RDW 91.4  78.2 - 95.6 %   Platelets 131 (*) 150 - 400 K/uL   No results found.  Review of Systems  HENT: Negative.   Eyes: Negative.   Respiratory: Negative.   Cardiovascular: Negative.   Gastrointestinal: Negative.   Genitourinary: Negative.   Musculoskeletal: Positive for back pain.  Neurological: Positive for sensory change, focal weakness and weakness.  Endo/Heme/Allergies:  Negative.   Psychiatric/Behavioral: Negative.     There were no vitals taken for this visit. Physical Exam  Constitutional: He is oriented to person, place, and time. He appears well-developed and well-nourished.  HENT:  Head: Normocephalic and atraumatic.  Eyes: Conjunctivae and EOM are normal. Pupils are equal, round, and reactive to light.  Neck: Neck supple.  Cardiovascular: Normal rate and regular rhythm.   Respiratory: Effort normal and breath sounds normal.  GI: Bowel sounds are normal.  Musculoskeletal: Normal range of motion.  Neurological: He is alert and oriented to person, place, and time.  Motor function is  normal both lower extremities. Stationing gait is normal. Patient wants with slight forward stooped. He has difficulty standing. Palpation and percussion across lumbar spine reproduces central eyes low back pain.  Skin: Skin is warm and dry.  Psychiatric: He has a normal mood and affect. His behavior is normal. Judgment and thought content normal.     Assessment/Plan L1 traumatic and pathologic fracture.  Acrylic balloon kyphoplasty L1  Kari Kerth J 12/10/2012, 1:04 PM

## 2012-12-10 NOTE — Anesthesia Preprocedure Evaluation (Signed)
Anesthesia Evaluation  Patient identified by MRN, date of birth, ID band Patient awake    Reviewed: Allergy & Precautions, H&P , NPO status , Patient's Chart, lab work & pertinent test results  Airway Mallampati: II  Neck ROM: full    Dental   Pulmonary Current Smoker,          Cardiovascular hypertension,     Neuro/Psych Depression  Neuromuscular disease    GI/Hepatic   Endo/Other  diabetes, Type 2  Renal/GU      Musculoskeletal   Abdominal   Peds  Hematology   Anesthesia Other Findings   Reproductive/Obstetrics                           Anesthesia Physical Anesthesia Plan  ASA: III  Anesthesia Plan: General   Post-op Pain Management:    Induction: Intravenous  Airway Management Planned: Oral ETT  Additional Equipment:   Intra-op Plan:   Post-operative Plan: Extubation in OR  Informed Consent: I have reviewed the patients History and Physical, chart, labs and discussed the procedure including the risks, benefits and alternatives for the proposed anesthesia with the patient or authorized representative who has indicated his/her understanding and acceptance.     Plan Discussed with: CRNA, Anesthesiologist and Surgeon  Anesthesia Plan Comments:         Anesthesia Quick Evaluation

## 2012-12-10 NOTE — Transfer of Care (Signed)
Immediate Anesthesia Transfer of Care Note  Patient: Alexander Booth [facility]  Procedure(s) Performed: Procedure(s) with comments: Lumbar one Kyphoplasty (N/A) - Lumbar One Kyphoplasty  Patient Location: PACU  Anesthesia Type:General  Level of Consciousness: awake, oriented, patient cooperative and responds to stimulation  Airway & Oxygen Therapy: Patient Spontanous Breathing and Patient connected to nasal cannula oxygen  Post-op Assessment: Report given to PACU RN, Post -op Vital signs reviewed and stable, Patient moving all extremities and Patient moving all extremities X 4  Post vital signs: Reviewed and stable  Complications: No apparent anesthesia complications

## 2012-12-10 NOTE — Anesthesia Procedure Notes (Signed)
Procedure Name: Intubation Date/Time: 12/10/2012 5:22 PM Performed by: Wray Kearns A Pre-anesthesia Checklist: Patient identified, Emergency Drugs available, Suction available, Patient being monitored and Timeout performed Patient Re-evaluated:Patient Re-evaluated prior to inductionOxygen Delivery Method: Circle system utilized Preoxygenation: Pre-oxygenation with 100% oxygen Intubation Type: IV induction and Cricoid Pressure applied Ventilation: Mask ventilation without difficulty and Oral airway inserted - appropriate to patient size Laryngoscope Size: Mac and 4 Grade View: Grade I Tube type: Oral Tube size: 8.0 mm Number of attempts: 1 Airway Equipment and Method: Stylet Placement Confirmation: ETT inserted through vocal cords under direct vision,  positive ETCO2 and breath sounds checked- equal and bilateral Secured at: 23 cm Tube secured with: Tape Dental Injury: Teeth and Oropharynx as per pre-operative assessment

## 2012-12-10 NOTE — Op Note (Signed)
Date of surgery: 12/10/2012 Preoperative diagnosis: Traumatic vertebral fracture L1 Post operative diagnosis: Same Procedure: L1 acrylic balloon kyphoplasty Surgeon: Sherilyn Cooter Harry Shuck,MD Indications: Patient is a 67 year old individual who had a 4 foot fall from a ladder he landed onto his buttocks and his side he has an L1 compression fracture pain has been intractable.  Procedure: The patient was brought to the operating room supine on a stretcher. After the smooth induction of general endotracheal anesthesia the patient was carefully positioned in the prone position on the operating table with the bony prominences appropriately padded and protected. Overalls were used under the patient's chest back was prepped with alcohol and DuraPrep and draped in a sterile fashion and then biplane fluoroscopy was used to isolate the L1 vertebrae. A pedicle entry site for this her vertebrae was chosen 4 cm lateral to the pedicle at the 9:00 position. A Jamshidi needle was inserted into the pedicle via the transfer radicular transvertebral approach. Biplane fluoroscopy was used during this procedure. Then the cannula was in the vertebral body the inner cannula was removed and a bone drill was used to create a space for the balloon. Balloon was then inflated 250 mmHg and was noted to deteriorate to 120 millimeters of pressure. 3 Cc of dye was injected into the balloon. The med was mixed to appropriate consistency and then injected into the vertebral body under biplane fluoroscopy until complete filling was achieved. A total of 4-1/2 cc of cement was injected.  Final fluoroscopic images were obtained the Jamshidi needle was removed the incision was closed with a singular 3-0 Vicryl stitch and the patient was then returned to the recovery room in stable condition.

## 2012-12-10 NOTE — Progress Notes (Signed)
After 150 mcg Fentanyl & po OXY IR, pt rates his pain 7/10. SBP running 190's  to 209. Dr Noreene Larsson notified. IV Tylenol infusing as ord. Will cont to monitor.

## 2012-12-10 NOTE — Preoperative (Signed)
Beta Blockers   Reason not to administer Beta Blockers:Not Applicable 

## 2012-12-11 ENCOUNTER — Encounter (HOSPITAL_COMMUNITY): Payer: Self-pay | Admitting: Neurological Surgery

## 2012-12-11 NOTE — Progress Notes (Signed)
Quick Note:  Left a message on the pt's home voice mail (vm was in the patient's voice and he stated his name) regarding his recent MRI being within normal limits. Contact information was given so that he may call with any questions or concerns.   ______

## 2012-12-16 ENCOUNTER — Telehealth: Payer: Self-pay | Admitting: *Deleted

## 2012-12-16 NOTE — Telephone Encounter (Signed)
error 

## 2012-12-16 NOTE — Telephone Encounter (Signed)
Spoke with patient and relayed the results of their MRI as well as Dr Athar's advise or recommendations.  The patient was also reminded of any future appointments.  The patient understood and had no questions.  

## 2012-12-22 ENCOUNTER — Encounter: Payer: Self-pay | Admitting: Neurology

## 2012-12-22 ENCOUNTER — Ambulatory Visit (INDEPENDENT_AMBULATORY_CARE_PROVIDER_SITE_OTHER): Payer: Medicare Other | Admitting: Neurology

## 2012-12-22 VITALS — BP 128/69 | HR 59 | Temp 97.4°F | Ht 73.0 in | Wt 174.0 lb

## 2012-12-22 DIAGNOSIS — S32010S Wedge compression fracture of first lumbar vertebra, sequela: Secondary | ICD-10-CM

## 2012-12-22 DIAGNOSIS — F0391 Unspecified dementia with behavioral disturbance: Secondary | ICD-10-CM

## 2012-12-22 DIAGNOSIS — R269 Unspecified abnormalities of gait and mobility: Secondary | ICD-10-CM

## 2012-12-22 DIAGNOSIS — G609 Hereditary and idiopathic neuropathy, unspecified: Secondary | ICD-10-CM

## 2012-12-22 DIAGNOSIS — IMO0002 Reserved for concepts with insufficient information to code with codable children: Secondary | ICD-10-CM

## 2012-12-22 MED ORDER — FLUOXETINE HCL 10 MG PO CAPS: 10.0000 mg | ORAL_CAPSULE | Freq: Every day | ORAL | Status: DC

## 2012-12-22 MED ORDER — TRAZODONE HCL 100 MG PO TABS: 200.0000 mg | ORAL_TABLET | Freq: Every day | ORAL | Status: DC

## 2012-12-22 MED ORDER — DIVALPROEX SODIUM 500 MG PO DR TAB: 1000.0000 mg | DELAYED_RELEASE_TABLET | Freq: Every day | ORAL | Status: DC

## 2012-12-22 MED ORDER — DONEPEZIL HCL 10 MG PO TABS: 10.0000 mg | ORAL_TABLET | Freq: Every morning | ORAL | Status: DC

## 2012-12-22 NOTE — Progress Notes (Signed)
Subjective:    Patient ID: Alexander Booth is a 67 y.o. male.  HPI  Interim history:   Mr. Puthoff is a very pleasant 67 year old gentleman who presents for followup consultation of his Memory loss and gait disorder. He is accompanied by his wife and daughter today. He has followed with Dr. Avie Echevaria since 2004. I first met him on 09/29/2012 at which time I kept him on the same medications. He was advised to get a repeat brain MRI. He has an underlying medical history of hypertension, diabetes, hyperlipidemia and has progressive memory loss with some interval changes. Neuropsychological testing was done in December 2004 which showed full-scale IQ of 80, verbal IQ of 80, a performance IQ of 78. MMSE was in the 28 range. MRI brain in 2004 was normal a PET scan was positive for Alzheimer's, with normal TSH, RPR, HIV, B12 and folate. He did have a borderline low B12. He had a normal methylmalonic acid level. Sleep study showed mild to moderate OSA but he could not tolerate CPAP. He has been on Aricept since March 2005. He has had burning foot pain for which gabapentin and Lyrica were tried without help. He had neck surgery in October 2012 with anterior cervical discectomy and fusion. In March 2013 his MMSE was 24, clock drawing was 4, and normal fluency was 11. In January his MMSE was 23, clock drawing was 4, animal fluency was 9. His recent brain MRI showed a nonspecific posterior right parietal lobe lesion without contrast uptake or edema. A followup MRI was recommended. He had a brain MRI with and without contrast on 12/03/12, which showed nonspecific WM changes.   He fell in May 2014 through the roof of his home and fell on his back and sustained a compression Fx of L1 for which he recently had kyphoplasty under Dr. Danielle Dess. We had specifically talked about not climbing on ladders or be at heights and I had asked him to not use heavy equipment. He still has considerable pain and has been using a cane.  He has been driving, although his wife and daughter feel he should not be driving. I reviewed the L spine MRI report from 12/03/12.   His Past Medical History Is Significant For: Past Medical History  Diagnosis Date  . Hypertension     Essential  . Presenile dementia   . Hypercholesterolemia   . Lumbosacral disc disease     with chronic low back pain  . Peripheral neuropathy   . Bronchitis     Subacute and hx of ongoing cigarette smoking one half pack daily  . Diabetes mellitus     He's also had a hx of high cholesterol   . Erectile dysfunction     He's had a hx of   . FHx: heart disease     Positive for   . Dementia     He has a hx of the   . Dyslipidemia     He also has a past hx of   . Peripheral neuropathy     He also has   . Depression   . Type 2 diabetes mellitus 2010    takes oral meds  . Fracture of lumbar vertebra 11/25/2012  . Hard of hearing     very little hearing on left side;better in right ear    His Past Surgical History Is Significant For: Past Surgical History  Procedure Laterality Date  . Knee surgery      Previous left total  replacement  . Hemorrhoid surgery      x2  . Other surgical history  1992    Bone Fusion in Right Foot  . Anterior cervical decomp/discectomy fusion    . Appendectomy    . Joint replacement    . Total knee arthroplasty Left   . Kyphoplasty N/A 12/10/2012    Procedure: Lumbar one Kyphoplasty;  Surgeon: Barnett Abu, MD;  Location: [facility];  Service: Neurosurgery;  Laterality: N/A;  Lumbar One Kyphoplasty    His Family History Is Significant For: Family History  Problem Relation Age of Onset  . Multiple myeloma Mother     His Social History Is Significant For: History   Social History  . Marital Status: Married    Spouse Name: N/A    Number of Children: N/A  . Years of Education: N/A   Social History Main Topics  . Smoking status: Current Every Day Smoker -- 1.00 packs/day for 60 years  . Smokeless tobacco:  Never Used  . Alcohol Use: No  . Drug Use: No  . Sexually Active: None   Other Topics Concern  . None   Social History Narrative  . None    His Allergies Are:  Allergies  Allergen Reactions  . Codeine     Itch   :   His Current Medications Are:  Outpatient Encounter Prescriptions as of 12/22/2012  Medication Sig Dispense Refill  . digoxin (LANOXIN) 0.25 MG tablet Take 0.25 mg by mouth daily.      . divalproex (DEPAKOTE) 500 MG DR tablet Take 2 tablets (1,000 mg total) by mouth at bedtime.  180 tablet  3  . donepezil (ARICEPT) 10 MG tablet Take 1 tablet (10 mg total) by mouth every morning.  90 tablet  3  . FLUoxetine (PROZAC) 10 MG capsule Take 1 capsule (10 mg total) by mouth daily.  90 capsule  3  . hydrochlorothiazide (HYDRODIURIL) 25 MG tablet Take 12.5 mg by mouth daily.        Marland Kitchen HYDROcodone-acetaminophen (NORCO/VICODIN) 5-325 MG per tablet Take 1 tablet by mouth 2 (two) times daily as needed for pain.      . meloxicam (MOBIC) 7.5 MG tablet       . metFORMIN (GLUCOPHAGE) 500 MG tablet Take 1,000 mg by mouth 2 (two) times daily with a meal.      . nitroGLYCERIN (NITROSTAT) 0.4 MG SL tablet Place 0.4 mg under the tongue every 5 (five) minutes as needed for chest pain.      Marland Kitchen oxyCODONE-acetaminophen (PERCOCET) 10-325 MG per tablet       . simvastatin (ZOCOR) 80 MG tablet Take 80 mg by mouth every morning.      . tizanidine (ZANAFLEX) 2 MG capsule Take 1 capsule (2 mg total) by mouth 3 (three) times daily.  270 capsule  1  . traZODone (DESYREL) 100 MG tablet Take 2 tablets (200 mg total) by mouth at bedtime.  180 tablet  3  . [DISCONTINUED] divalproex (DEPAKOTE) 500 MG EC tablet Take 1,000 mg by mouth at bedtime.       . [DISCONTINUED] donepezil (ARICEPT) 10 MG tablet Take 10 mg by mouth every morning.       . [DISCONTINUED] FLUoxetine (PROZAC) 10 MG capsule Take 10 mg by mouth daily.      . [DISCONTINUED] PARoxetine (PAXIL) 20 MG tablet Take 20 mg by mouth at bedtime.      .  [DISCONTINUED] traZODone (DESYREL) 100 MG tablet Take 200 mg by  mouth at bedtime.       No facility-administered encounter medications on file as of 12/22/2012.    Review of Systems  HENT: Positive for hearing loss and tinnitus.   Neurological:       Memory loss    Objective:  Neurologic Exam  Physical Exam Physical Examination:   Filed Vitals:   12/22/12 1548  BP: 128/69  Pulse: 59  Temp: 97.4 F (36.3 C)    General Examination: The patient is a very pleasant 67 y.o. male in no acute distress. He appears well-developed and well-nourished and adequately groomed.   HEENT exam: Normocephalic, atraumatic, pupils are equal, round and reactive to light and accommodation. Extraocular tracking is mildly impaired. He is hard of hearing. Neck is supple but there is decrease in range of motion passively. He is status post 2 neck surgeries. He has no facial masking and speech is clear but scant. Oropharynx exam shows mild mouth dryness. Tongue protrudes centrally and palate elevates symmetrically.  Chest is clear to auscultation without wheezing, rhonchi or crackles noted. Heart sounds are normal without murmurs, rubs or gallops. Abdomen is soft, nontender with normal bowel sounds appreciated. Skin is dry. He does not have any obvious joint deformities but complains of lower back pain. He had left knee replacement surgery. Neurologically: Mental status: The patient is awake, alert and oriented to self, circumstance. I did not repeat the MMSE today. His last score was 23/30 in January. Cranial nerves are as described above. Motor exam reveals normal bulk and tone. He has no drift, tremor or rebound. Reflexes are 1+ in the upper extremities 2+ in the right knee and absent in the left knee. He has absent ankle jerks. Cerebellar testing shows no dysmetria or intention tremor. Sensory exam is intact to light touch, but decrease to pinprick and the upper extremities distally. He also has decreased sensation  to pinprick, vibration and temperature in the distal lower extremities to mid shin areas bilaterally. Romberg testing shows significant swaying. His gait shows decreased stride length and pace and decrease in arm swing. He turns in 3 steps and is a little wobbly while turning. His balance is mildly impaired. He walks with a single prong cane and has LBP.    Assessment and Plan:   In summary, Mr. Mcclenahan is a very pleasant 67 year old gentleman with a history of degenerative joint disease, neuropathy, chronic back pain and dementia most likely of the Alzheimer's type but also possible vascular dementia. His recent brain MRI and L spine MRI were discussed. He has chronic WM changes and we talked about prevention of cardiovascular d/s. He has stopped his metformin about 2-3 weeks ago on his own, but has not checked his CBG recently at home. He is advised to continue to track his CBG as he was labeled diabetic before. He has a gait disorder with balance problems most likely due to a combination of things namely chronic back pain, neuropathy, and atherosclerotic changes. Unfortunately, he had a fall with injury recently, while working on his roof, despite my advice to not climb on the roof, when I met him last time. I had specifically asked him to refrain from being at heights, climbing on ladders, using heavy machinery and re-iterated these cautions today. He is advised that he is at risk for falling and should be using a cane. He has significant hearing loss for which he has seen an ENT specialist and is not a candidate for hearing aids. His exam is stable and  renewed his VPA, donepezil, Prozac and Trazodone today. I will see him back in 3 months and we will talk about doing a round of outpt PT in about a month from now; his wife is advised to call. He may need to see Dr. Danielle Dess back as well. They were in agreement. He was advised to stay well-hydrated. He was advised to use a cane and not climb on any ladders  or use heavy equipment or dangerous yard equipment. They were advised to call with any interim questions, concerns or problems.

## 2012-12-22 NOTE — Patient Instructions (Addendum)
Please do not drive. No climbing on ladders or handling heavy machinery or yard equipment.   Please call in about 1 month, so we can initiate PT for gait and balance.   You may need to see Dr. Danielle Dess if the pain is not considerably better in one month.   Follow up in 3 months.

## 2013-01-01 ENCOUNTER — Other Ambulatory Visit: Payer: Self-pay | Admitting: Cardiology

## 2013-01-01 ENCOUNTER — Other Ambulatory Visit: Payer: Self-pay

## 2013-01-01 MED ORDER — DIVALPROEX SODIUM ER 500 MG PO TB24: 1000.0000 mg | ORAL_TABLET | Freq: Every day | ORAL | Status: DC

## 2013-01-01 NOTE — Telephone Encounter (Signed)
Pharmacy called questioning the Rx for Depakote.  Patient has always been on ER (per [facility]).  It appears Rx was entered as DR in [employer].  I have updated and resent the corrected Rx.

## 2013-01-05 ENCOUNTER — Other Ambulatory Visit: Payer: Medicare Other

## 2013-01-05 ENCOUNTER — Ambulatory Visit: Payer: Medicare Other | Admitting: Cardiology

## 2013-02-05 ENCOUNTER — Telehealth: Payer: Self-pay | Admitting: *Deleted

## 2013-02-05 DIAGNOSIS — F32A Depression, unspecified: Secondary | ICD-10-CM

## 2013-02-05 DIAGNOSIS — F329 Major depressive disorder, single episode, unspecified: Secondary | ICD-10-CM

## 2013-02-05 MED ORDER — FLUOXETINE HCL 20 MG PO CAPS: 20.0000 mg | ORAL_CAPSULE | Freq: Every day | ORAL | Status: DC

## 2013-02-05 NOTE — Telephone Encounter (Signed)
Patient wife called and stated her husband script was sent in for Prozac 10MG   But he needs a 20 mg prescription. This what he has always been on since dr. Sandria Manly has  giving him Prozac. Patient wife states she will be out of this med soon please advise .

## 2013-02-05 NOTE — Telephone Encounter (Signed)
[facility]

## 2013-02-08 NOTE — Telephone Encounter (Signed)
Dr. Frances Furbish has corrected the RX

## 2013-02-23 ENCOUNTER — Other Ambulatory Visit: Payer: Self-pay | Admitting: Cardiology

## 2013-02-23 NOTE — Telephone Encounter (Signed)
Rx for metformin

## 2013-02-24 ENCOUNTER — Other Ambulatory Visit: Payer: Self-pay

## 2013-02-25 NOTE — Telephone Encounter (Signed)
Called patient's home to schedule an appointment.  Patient states I need to talk to his wife about an appointment and she is not there; patient took message for his wife to call me back.

## 2013-03-09 ENCOUNTER — Telehealth: Payer: Self-pay | Admitting: *Deleted

## 2013-03-09 NOTE — Telephone Encounter (Signed)
Walk in pt. Pt is aware that Dr. Yevonne Pax nurse will be in tomorrow 03/10/13 she will able to refill metformin 500 mg twice a day. Pt verbalized understanding.

## 2013-03-10 NOTE — Telephone Encounter (Signed)
Called local to [facility] and [facility]

## 2013-03-23 ENCOUNTER — Other Ambulatory Visit: Payer: Self-pay | Admitting: Neurological Surgery

## 2013-03-23 DIAGNOSIS — M545 Low back pain, unspecified: Secondary | ICD-10-CM

## 2013-03-23 DIAGNOSIS — M546 Pain in thoracic spine: Secondary | ICD-10-CM

## 2013-03-26 ENCOUNTER — Encounter: Payer: Self-pay | Admitting: Cardiology

## 2013-03-26 ENCOUNTER — Ambulatory Visit (INDEPENDENT_AMBULATORY_CARE_PROVIDER_SITE_OTHER): Payer: Medicare Other | Admitting: Cardiology

## 2013-03-26 VITALS — BP 140/58 | HR 60 | Ht 73.0 in | Wt 169.0 lb

## 2013-03-26 DIAGNOSIS — E7800 Pure hypercholesterolemia, unspecified: Secondary | ICD-10-CM

## 2013-03-26 DIAGNOSIS — M545 Low back pain, unspecified: Secondary | ICD-10-CM

## 2013-03-26 DIAGNOSIS — R634 Abnormal weight loss: Secondary | ICD-10-CM

## 2013-03-26 DIAGNOSIS — I119 Hypertensive heart disease without heart failure: Secondary | ICD-10-CM

## 2013-03-26 MED ORDER — HYDROCHLOROTHIAZIDE 25 MG PO TABS: 12.5000 mg | ORAL_TABLET | Freq: Every day | ORAL | Status: DC

## 2013-03-26 MED ORDER — CYCLOBENZAPRINE HCL 5 MG PO TABS: 5.0000 mg | ORAL_TABLET | Freq: Three times a day (TID) | ORAL | Status: DC | PRN

## 2013-03-26 NOTE — Assessment & Plan Note (Signed)
The patient continues to smoke about one pack a day.  This is a contributing factor in his weight loss and poor appetite for food.  I emphasized the importance of smoking cessation particularly if he is anticipating possible future additional back surgery.

## 2013-03-26 NOTE — Assessment & Plan Note (Signed)
Blood pressure was remaining stable on current therapy.  No syncope.  No palpitations.

## 2013-03-26 NOTE — Assessment & Plan Note (Signed)
The patient has a history of hypercholesterolemia.  He presently is on simvastatin 80 mg daily.  Based on the recommendations we will decrease his dose to 40 mg at bedtime.  We are checking lab work today which will serve as a baseline and then we will recheck him again in 4 months

## 2013-03-26 NOTE — Progress Notes (Signed)
Alexander Booth Date of Birth:  07-10-1946 [facility] [address] [place], [place]  [address] [phone]  Fax   [phone]  HPI: This pleasant 67 year old gentleman is seen for a scheduled four-month followup office visit. Marland Kitchen He has a history of hypertension and hypercholesterolemia. The patient also has a history of dementia and a history of diabetes mellitus. His last visit he has not had any new cardiac symptoms. He denies any chest pain or shortness of breath. Patient has had occasional dizzy spells. He sometimes gets lightheaded if he stands up quickly.  The patient has had a poor appetite and his weight is down 10 pounds since last visit.  He continues to have intractable low back pain 24/7 and continues to followup with his neurosurgeon Dr. Danielle Dess.  He is scheduled to have an MRI of his back in the near future.   Current Outpatient Prescriptions  Medication Sig Dispense Refill  . DIGOX 250 MCG tablet TAKE 1 TABLET (250 MCG TOTAL) DAILY  90 tablet  2  . divalproex (DEPAKOTE ER) 500 MG 24 hr tablet Take 2 tablets (1,000 mg total) by mouth daily.  180 tablet  3  . donepezil (ARICEPT) 10 MG tablet Take 1 tablet (10 mg total) by mouth every morning.  90 tablet  3  . FLUoxetine (PROZAC) 20 MG capsule Take 1 capsule (20 mg total) by mouth daily.  90 capsule  3  . hydrochlorothiazide (HYDRODIURIL) 25 MG tablet Take 0.5 tablets (12.5 mg total) by mouth daily.  45 tablet  3  . HYDROcodone-acetaminophen (NORCO/VICODIN) 5-325 MG per tablet Take 1 tablet by mouth 2 (two) times daily as needed for pain.      . meloxicam (MOBIC) 7.5 MG tablet       . metFORMIN (GLUCOPHAGE) 500 MG tablet TAKE 2 TABLETS TWICE A DAY  120 tablet  4  . nitroGLYCERIN (NITROSTAT) 0.4 MG SL tablet Place 0.4 mg under the tongue every 5 (five) minutes as needed for chest pain.      . simvastatin (ZOCOR) 80 MG tablet       . traZODone (DESYREL) 100 MG tablet Take 2 tablets (200 mg total) by mouth  at bedtime.  180 tablet  3  . cyclobenzaprine (FLEXERIL) 5 MG tablet Take 1 tablet (5 mg total) by mouth 3 (three) times daily as needed for muscle spasms.  60 tablet  3  . [DISCONTINUED] sildenafil (VIAGRA) 50 MG tablet Take 1 tablet (50 mg total) by mouth as needed.  5 tablet  3   No current facility-administered medications for this visit.    Allergies  Allergen Reactions  . Codeine     Itch     Patient Active Problem List   Diagnosis Date Noted  . L1 vertebral fracture 12/10/2012  . Weight loss, unintentional 05/14/2012  . 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 Benign hypertensive heart disease without heart failure 06/18/2011  . Diabetes mellitus without mention of complication 06/18/2011  . Pure hypercholesterolemia 06/18/2011  . Osteoarthritis 06/18/2011  . 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 Current Every Day Smoker -- 1.00 packs/day for 60 years  Smokeless tobacco  . Never Used    History  Alcohol Use No    Family History  Problem Relation Age of Onset  . Multiple myeloma Mother     Review of Systems: The patient denies any heat or cold intolerance.  No weight gain or weight loss.  The patient denies headaches or blurry vision.  There is no cough or sputum production.  The patient denies dizziness.  There is no hematuria or hematochezia.  The patient denies any muscle aches or arthritis.  The patient denies any rash.  The patient denies frequent falling or instability.  There is no history of depression or anxiety.  All other systems were reviewed and are negative.   Physical Exam: Filed Vitals:   03/26/13 1535  BP: 140/58  Pulse: 60   the general appearance reveals a chronically ill-appearing gentleman.The head and neck exam reveals pupils equal and reactive.  Extraocular movements are full.  There is no scleral icterus.  The mouth and pharynx are normal.  The neck is supple.  The carotids reveal no bruits.   The jugular venous pressure is normal.  The  thyroid is not enlarged.  There is no lymphadenopathy.  The chest is clear to percussion and auscultation.  There are no rales or rhonchi.  Expansion of the chest is symmetrical.  The precordium is quiet.  The first heart sound is normal.  The second heart sound is physiologically split.  There is no murmur gallop rub or click.  There is no abnormal lift or heave.  The abdomen is soft and nontender.  The bowel sounds are normal.  The liver and spleen are not enlarged.  There are no abdominal masses.  There are no abdominal bruits.  Extremities reveal good pedal pulses.  There is no phlebitis or edema.  There is no cyanosis or clubbing.  Strength is normal and symmetrical in all extremities.  There is no lateralizing weakness.  There are no sensory deficits.  The skin is warm and dry.  There is no rash.      Assessment / Plan: Continue same medication except decrease simvastatin to 40 mg at bedtime. Also he is requesting a change in his muscle relaxant.  He had been on Zanaflex But prefers generic Flexeril.  We've prescribed Flexeril generic 5 mg 3 times a day when necessary. Recheck in 4 months for followup office visit lipid panel hepatic function panel and nasal metabolic panel

## 2013-03-26 NOTE — Patient Instructions (Addendum)
Will obtain labs today and call you with the results (LP/BMET/HFP)  STOP ZANAFLEX  START FLEXERIL 5 MG THREE TIMES A DAY AS NEEDED, [facility]  DECREASE SIMVASTATIN TO 40 MG (1/2 80 MG) DAILY  Your physician wants you to follow-up in: 4 months with fasting labs (lp/bmet/hfp)  You will receive a reminder letter in the mail two months in advance. If you don't receive a letter, please call our office to schedule the follow-up appointment.

## 2013-03-27 LAB — HEPATIC FUNCTION PANEL
AST: 16 U/L (ref 0–37)
Albumin: 4 g/dL (ref 3.5–5.2)
Alkaline Phosphatase: 34 U/L — ABNORMAL LOW (ref 39–117)
Total Bilirubin: 0.4 mg/dL (ref 0.3–1.2)
Total Protein: 6.4 g/dL (ref 6.0–8.3)

## 2013-03-27 LAB — LIPID PANEL
HDL: 31 mg/dL — ABNORMAL LOW (ref >39)
Total CHOL/HDL Ratio: 3.9 Ratio
Triglycerides: 259 mg/dL — ABNORMAL HIGH (ref <150)

## 2013-03-27 LAB — BASIC METABOLIC PANEL
Calcium: 9.4 mg/dL (ref 8.4–10.5)
Creat: 0.72 mg/dL (ref 0.50–1.35)
Sodium: 138 mEq/L (ref 135–145)

## 2013-03-28 NOTE — Progress Notes (Signed)
Quick Note:  Please report to patient. The recent labs are stable. Continue same medication and careful diet. ______ 

## 2013-03-29 ENCOUNTER — Other Ambulatory Visit: Payer: Self-pay | Admitting: *Deleted

## 2013-03-29 ENCOUNTER — Telehealth: Payer: Self-pay | Admitting: *Deleted

## 2013-03-29 DIAGNOSIS — E119 Type 2 diabetes mellitus without complications: Secondary | ICD-10-CM

## 2013-03-29 NOTE — Telephone Encounter (Signed)
Message copied by Burnell Blanks on Mon Mar 29, 2013  4:58 PM ------      Message from: Cassell Clement      Created: Sun Mar 28, 2013  2:46 PM       Please report to patient.  The recent labs are stable. Continue same medication and careful diet. ------

## 2013-03-29 NOTE — Telephone Encounter (Signed)
Advised wife of labs. Wife states she checks patients blood sugars at home at times and blood sugars are usually in 70's before medication. Per wife patient does have times where he feels dizzy, she was wondering if she should decrease Metformin. She will bring patient by tomorrow for a Hgb A1C.

## 2013-03-30 ENCOUNTER — Other Ambulatory Visit (INDEPENDENT_AMBULATORY_CARE_PROVIDER_SITE_OTHER): Payer: Medicare Other

## 2013-03-30 ENCOUNTER — Encounter: Payer: Self-pay | Admitting: Neurology

## 2013-03-30 ENCOUNTER — Ambulatory Visit (INDEPENDENT_AMBULATORY_CARE_PROVIDER_SITE_OTHER): Payer: Medicare Other | Admitting: Neurology

## 2013-03-30 VITALS — BP 144/73 | HR 59 | Temp 97.9°F | Ht 73.0 in | Wt 170.0 lb

## 2013-03-30 DIAGNOSIS — E119 Type 2 diabetes mellitus without complications: Secondary | ICD-10-CM

## 2013-03-30 DIAGNOSIS — S32010S Wedge compression fracture of first lumbar vertebra, sequela: Secondary | ICD-10-CM

## 2013-03-30 DIAGNOSIS — IMO0002 Reserved for concepts with insufficient information to code with codable children: Secondary | ICD-10-CM

## 2013-03-30 DIAGNOSIS — R251 Tremor, unspecified: Secondary | ICD-10-CM

## 2013-03-30 DIAGNOSIS — M545 Low back pain, unspecified: Secondary | ICD-10-CM

## 2013-03-30 DIAGNOSIS — R269 Unspecified abnormalities of gait and mobility: Secondary | ICD-10-CM

## 2013-03-30 DIAGNOSIS — G609 Hereditary and idiopathic neuropathy, unspecified: Secondary | ICD-10-CM

## 2013-03-30 DIAGNOSIS — F0391 Unspecified dementia with behavioral disturbance: Secondary | ICD-10-CM

## 2013-03-30 DIAGNOSIS — G250 Essential tremor: Secondary | ICD-10-CM

## 2013-03-30 LAB — HEMOGLOBIN A1C: Hgb A1c MFr Bld: 5.7 % (ref 4.6–6.5)

## 2013-03-30 NOTE — Patient Instructions (Addendum)
I think overall you are doing fairly well but I do want to suggest a few things today:  Remember to drink plenty of fluid, eat healthy meals and do not skip any meals. Try to eat protein with a every meal and eat a healthy snack such as fruit or nuts in between meals. Try to keep a regular sleep-wake schedule and try to exercise daily, particularly in the form of walking, 20-30 minutes a day, if you can.   As far as your medications are concerned, I would like to suggest no changes, I will prescribe a 2 wheeled walker to you. Talk to Dr. Danielle Dess on Thursday about doing PT.  As far as diagnostic testing: no test from my side, keep MRI appointments tomorrow.  I would like to see you back in 4 months, sooner if we need to. Please call us with any interim questions, concerns, problems, updates or refill requests.  Brett Canales is my clinical assistant and will answer any of your questions and relay your messages to me and also relay most of my messages to you.  Our phone number is [phone]. We also have an after hours call service for urgent matters and there is a physician on-call for urgent questions. For any emergencies you know to call 911 or go to the nearest emergency room.

## 2013-03-30 NOTE — Progress Notes (Signed)
Subjective:    Patient ID: Alexander Booth is a 67 y.o. male.  HPI  Interim history:  Alexander Booth is a very pleasant 67 year old gentleman who presents for followup consultation of his Memory loss and gait disorder. He is accompanied by his wife and daughter again today. 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 I discussed his recent brain MRI and lumbar spine MRI results at the time. He has evidence of chronic white matter changes and we talked about prevention of cardiovascular disease at the time. He had stopped taking his metformin about 2-3 weeks prior to his last visit with me. He was advised to continue to measure his capillary blood glucose levels. I felt that his gait disorder and balance problems for most likely multifactorial in nature, due to chronic back pain, neuropathy and atherosclerotic sclerotic changes. He had a fall with injury recently and had climbed on the roof despite my advice not to climb on any heights. He was reeducated in that regard and also advised not to use any heavy machinery. He also has a history of significant hearing loss. I felt that his exam was stable. He was about to start another round of physical therapy. He has followed with Dr. Avie Echevaria since 2004. I first met him on 09/29/2012 at which time I kept him on the same medications. He was advised to get a repeat brain MRI. He has an underlying medical history of hypertension, diabetes, hyperlipidemia and has progressive memory loss with some interval changes. Neuropsychological testing was done in December 2004 which showed full-scale IQ of 80, verbal IQ of 83, a performance IQ of 78. MMSE was in the 28 range. MRI brain in 2004 was normal a PET scan was positive for Alzheimer's, with normal TSH, RPR, HIV, B12 and folate. He did have a borderline low B12. He had a normal methylmalonic acid level. Sleep study showed mild to  moderate OSA but he could not tolerate CPAP. He has been on Aricept since March 2005. He has had burning foot pain for which gabapentin and Lyrica were tried without help. He had neck surgery in October 2012 with anterior cervical discectomy and fusion. In March 2013 his MMSE was 24, clock drawing was 4, and normal fluency was 11. In January his MMSE was 23, clock drawing was 4, animal fluency was 9. His recent brain MRI showed a nonspecific posterior right parietal lobe lesion without contrast uptake or edema. A followup MRI was recommended. He had a brain MRI with and without contrast on 12/03/12, which showed nonspecific WM changes.  He has ongoing back pain, radiating to the back of both legs, and is more unstable walking, he uses his single prong cane and while he has not fallen, his wife and daughter are worried, that he will fall. He is taking Norco 1 pill bid, and has had 2 epidural injections, which did not help. He is scheduled for a T- and L-spine MRI tomorrow. He is also on Flexeril. He saw Dr. Patty Sermons for routine check up on 03/26/13. He needs to get A1C checked today.  His Past Medical History Is Significant For: Past Medical History  Diagnosis Date  . Hypertension     Essential  . Presenile dementia   . Hypercholesterolemia   . Lumbosacral disc disease     with chronic low back pain  . Peripheral neuropathy   . Bronchitis  Subacute and hx of ongoing cigarette smoking one half pack daily  . Diabetes mellitus     He's also had a hx of high cholesterol   . Erectile dysfunction     He's had a hx of   . FHx: heart disease     Positive for   . Dementia     He has a hx of the   . Dyslipidemia     He also has a past hx of   . Peripheral neuropathy     He also has   . Depression   . Type 2 diabetes mellitus 2010    takes oral meds  . Fracture of lumbar vertebra 11/25/2012  . Hard of hearing     very little hearing on left side;better in right ear    His Past Surgical History  Is Significant For: Past Surgical History  Procedure Laterality Date  . Knee surgery      Previous left total replacement  . Hemorrhoid surgery      x2  . Other surgical history  1992    Bone Fusion in Right Foot  . Anterior cervical decomp/discectomy fusion    . Appendectomy    . Joint replacement    . Total knee arthroplasty Left   . Kyphoplasty N/A 12/10/2012    Procedure: Lumbar one Kyphoplasty;  Surgeon: Barnett Abu, MD;  Location: [facility];  Service: Neurosurgery;  Laterality: N/A;  Lumbar One Kyphoplasty    His Family History Is Significant For: Family History  Problem Relation Age of Onset  . Multiple myeloma Mother     His Social History Is Significant For: History   Social History  . Marital Status: Married    Spouse Name: N/A    Number of Children: N/A  . Years of Education: N/A   Social History Main Topics  . Smoking status: Current Every Day Smoker -- 1.00 packs/day for 60 years  . Smokeless tobacco: Never Used  . Alcohol Use: No  . Drug Use: No  . Sexual Activity: None   Other Topics Concern  . None   Social History Narrative  . None    His Allergies Are:  Allergies  Allergen Reactions  . Codeine     Itch   :   His Current Medications Are:  Outpatient Encounter Prescriptions as of 03/30/2013  Medication Sig Dispense Refill  . cyclobenzaprine (FLEXERIL) 5 MG tablet Take 1 tablet (5 mg total) by mouth 3 (three) times daily as needed for muscle spasms.  60 tablet  3  . DIGOX 250 MCG tablet TAKE 1 TABLET (250 MCG TOTAL) DAILY  90 tablet  2  . divalproex (DEPAKOTE ER) 500 MG 24 hr tablet Take 2 tablets (1,000 mg total) by mouth daily.  180 tablet  3  . donepezil (ARICEPT) 10 MG tablet Take 1 tablet (10 mg total) by mouth every morning.  90 tablet  3  . FLUoxetine (PROZAC) 20 MG capsule Take 1 capsule (20 mg total) by mouth daily.  90 capsule  3  . hydrochlorothiazide (HYDRODIURIL) 25 MG tablet Take 0.5 tablets (12.5 mg total) by mouth daily.  45  tablet  3  . HYDROcodone-acetaminophen (NORCO/VICODIN) 5-325 MG per tablet Take 1 tablet by mouth 2 (two) times daily as needed for pain.      . meloxicam (MOBIC) 7.5 MG tablet       . metFORMIN (GLUCOPHAGE) 500 MG tablet TAKE 2 TABLETS TWICE A DAY  120 tablet  4  .  nitroGLYCERIN (NITROSTAT) 0.4 MG SL tablet Place 0.4 mg under the tongue every 5 (five) minutes as needed for chest pain.      . simvastatin (ZOCOR) 40 MG tablet Take 40 mg by mouth every evening.      . traZODone (DESYREL) 100 MG tablet Take 2 tablets (200 mg total) by mouth at bedtime.  180 tablet  3  . [DISCONTINUED] simvastatin (ZOCOR) 80 MG tablet        No facility-administered encounter medications on file as of 03/30/2013.   Review of Systems  Constitutional: Positive for unexpected weight change.  HENT: Positive for hearing loss and tinnitus.   Endocrine: Positive for cold intolerance.  Musculoskeletal: Positive for myalgias and arthralgias.  Neurological: Positive for dizziness.    Objective:  Neurologic Exam  Physical Exam Physical Examination:   Filed Vitals:   03/30/13 1128  BP: 144/73  Pulse: 59  Temp: 97.9 F (36.6 C)    General Examination: The patient is a very pleasant 67 y.o. male in no acute distress. He appears frail and thinner than before and is adequately groomed. He is somewhat anxious appearing today.  HEENT exam: Normocephalic, atraumatic, pupils are equal, round and reactive to light and accommodation. Extraocular tracking is mildly impaired. He is hard of hearing. Neck is supple but there is decrease in range of motion passively. He is status post 2 neck surgeries. He has no facial masking and speech is clear but scant. He has a slight head tremor. Oropharynx exam shows mild mouth dryness. Tongue protrudes centrally and palate elevates symmetrically.  Chest is clear to auscultation without wheezing, rhonchi or crackles noted. Heart sounds are normal without murmurs, rubs or gallops. Abdomen  is soft, nontender with normal bowel sounds appreciated. Skin is dry. He does not have any obvious joint deformities but complains of lower back pain. He had left knee replacement surgery. Neurologically: Mental status: The patient is awake, alert and oriented to self, circumstance. I did not repeat the MMSE today. His last score was 23/30 in January. Cranial nerves are as described above. Motor exam reveals normal bulk and tone. He has no drift, or rebound. He is a very mild postural and action tremor in both upper extremities. Reflexes are 1+ in the upper extremities 2+ in the right knee and absent in the left knee. He has absent ankle jerks. Cerebellar testing shows no dysmetria or intention tremor. Sensory exam is intact to light touch, but decrease to pinprick and the upper extremities, but he has decreased sensation to pinprick, vibration and temperature in the distal lower extremities to mid shin areas bilaterally. Romberg testing shows significant swaying. His gait shows decreased stride length and pace and decrease in arm swing and he is more insecure with his gait today, also c/o radiating LBP while standing and walking. His posture is more stooped today. He turns in 3 steps and is a little wobbly while turning. His balance is mildly impaired. He walks with a single prong cane.   Assessment and Plan:   In summary, Alexander Booth is a very pleasant 67 year old gentleman with a history of degenerative joint disease, neuropathy, chronic back pain and dementia most likely of the Alzheimer's type but also possible vascular dementia. His brain MRI in May of this year showed chronic WM changes and we talked about prevention of cardiovascular d/s. He has stopped his metformin about 2-3 weeks ago on his own, but has not checked his CBG recently at home. He will get a hemoglobin  A1c checked today per his cardiologist. He has a recent diagnosis of L1 compression fracture and had surgery under Dr. Danielle Dess for this.  He has ongoing lower back pain which has caused him to have a worse posture and he walks more insecurely today. He certainly had a setback in his gait disorder and balance problems since his recent fall. Unfortunately, he had a fall with injury recently, while working on his roof, despite my advice to not climb on the roof, when I met him last time. I had specifically asked him to refrain from being at heights, climbing on ladders, using heavy machinery and re-iterated these cautions very clearly to him last time. Today he is advised to start using a 2 wheeled rolling walker with sliders in the back because of risk for falling and worsening gait. He is scheduled for MRI of his lumbar and thoracic spine tomorrow per Dr. Danielle Dess. He is going to see Dr. Danielle Dess back the day after his MRIs. Unless he has a structural lesion he may be a good candidate for physical therapy and I've advised him to bring this up with his neurosurgeon. I did not make any changes to his medications. I advised his family that history of her is most likely a combination of drug-induced tremor and physiological and hands tremor do to being in pain and being nervous. Depakote can cause tremors. I will see him back in 4 months.

## 2013-03-31 ENCOUNTER — Ambulatory Visit
Admission: RE | Admit: 2013-03-31 | Discharge: 2013-03-31 | Disposition: A | Discharge status: Home / Self Care | Payer: Medicare Other | Source: Ambulatory Visit | Attending: Neurological Surgery | Admitting: Neurological Surgery

## 2013-03-31 DIAGNOSIS — M545 Low back pain, unspecified: Secondary | ICD-10-CM

## 2013-03-31 DIAGNOSIS — M546 Pain in thoracic spine: Secondary | ICD-10-CM

## 2013-03-31 MED ORDER — GADOBENATE DIMEGLUMINE 529 MG/ML IV SOLN
15.0000 mL | Freq: Once | INTRAVENOUS | Status: AC | PRN
  Administered 2013-03-31: 15 mL via INTRAVENOUS

## 2013-03-31 NOTE — Progress Notes (Signed)
Quick Note:  Please report to patient. The recent labs are stable. Continue same medication and careful diet. A1C 5.7 ______

## 2013-04-02 ENCOUNTER — Telehealth: Payer: Self-pay | Admitting: *Deleted

## 2013-04-02 ENCOUNTER — Other Ambulatory Visit: Payer: Self-pay | Admitting: Neurological Surgery

## 2013-04-02 NOTE — Telephone Encounter (Signed)
Message copied by Burnell Blanks on Fri Apr 02, 2013  5:33 PM ------      Message from: Alexander Booth      Created: Wed Mar 31, 2013  5:24 AM       Please report to patient.  The recent labs are stable. Continue same medication and careful diet. A1C 5.7 ------

## 2013-04-02 NOTE — Telephone Encounter (Signed)
Discussed with Dr. Patty Sermons decreasing the Metformin to 1 daily secondary to feeling dizzy and lightheaded at times. Advised wife and she will start checking blood sugars more frequently

## 2013-04-09 ENCOUNTER — Encounter (HOSPITAL_COMMUNITY): Payer: Self-pay | Admitting: Pharmacist

## 2013-04-14 ENCOUNTER — Encounter (HOSPITAL_COMMUNITY): Payer: Self-pay

## 2013-04-14 ENCOUNTER — Encounter (HOSPITAL_COMMUNITY)
Admission: RE | Admit: 2013-04-14 | Discharge: 2013-04-14 | Disposition: A | Discharge status: Home / Self Care | Payer: Medicare Other | Source: Ambulatory Visit | Attending: Neurological Surgery | Admitting: Neurological Surgery

## 2013-04-14 DIAGNOSIS — Z01818 Encounter for other preprocedural examination: Secondary | ICD-10-CM | POA: Insufficient documentation

## 2013-04-14 DIAGNOSIS — Z01812 Encounter for preprocedural laboratory examination: Secondary | ICD-10-CM | POA: Insufficient documentation

## 2013-04-14 LAB — CBC
HCT: 42 % (ref 39.0–52.0)
Hemoglobin: 14.7 g/dL (ref 13.0–17.0)
MCH: 34.1 pg — ABNORMAL HIGH (ref 26.0–34.0)
Platelets: 143 10*3/uL — ABNORMAL LOW (ref 150–400)
RBC: 4.31 MIL/uL (ref 4.22–5.81)
WBC: 7.8 10*3/uL (ref 4.0–10.5)

## 2013-04-14 LAB — TYPE AND SCREEN
ABO/RH(D): A POS
Antibody Screen: NEGATIVE

## 2013-04-14 LAB — BASIC METABOLIC PANEL
CO2: 30 mEq/L (ref 19–32)
Calcium: 9.4 mg/dL (ref 8.4–10.5)
Glucose, Bld: 94 mg/dL (ref 70–99)
Potassium: 4 mEq/L (ref 3.5–5.1)
Sodium: 137 mEq/L (ref 135–145)

## 2013-04-14 LAB — SURGICAL PCR SCREEN: Staphylococcus aureus: NEGATIVE

## 2013-04-14 NOTE — Pre-Procedure Instructions (Signed)
Alexander Booth [facility]  04/14/2013   Your procedure is scheduled on:  Tuesday  04/20/13   Report to [facility]  2 * 3 at 845  AM.  Call this number if you have problems the morning of surgery: [phone]   Remember:   Do not eat food or drink liquids after midnight.   Take these medicines the morning of surgery with A SIP OF WATER:  DIGOXIN(LANOXIN), DEPAKOTE, FLUOXETINE(PROZAC), HYDROCODONE   Do not wear jewelry, make-up or nail polish.  Do not wear lotions, powders, or perfumes. You may wear deodorant.  Do not shave 48 hours prior to surgery. Men may shave face and neck.  Do not bring valuables to the hospital.  [facility] is not responsible                  for any belongings or valuables.               Contacts, dentures or bridgework may not be worn into surgery.  Leave suitcase in the car. After surgery it may be brought to your room.  For patients admitted to the hospital, discharge time is determined by your                treatment team.               Patients discharged the day of surgery will not be allowed to drive  home.  Name and phone number of your driver:   Special Instructions: Shower using CHG 2 nights before surgery and the night before surgery.  If you shower the day of surgery use CHG.  Use special wash - you have one bottle of CHG for all showers.  You should use approximately 1/3 of the bottle for each shower.   Please read over the following fact sheets that you were given: Pain Booklet, Coughing and Deep Breathing, Blood Transfusion Information, MRSA Information and Surgical Site Infection Prevention

## 2013-04-15 ENCOUNTER — Telehealth: Payer: Self-pay | Admitting: *Deleted

## 2013-04-15 NOTE — Progress Notes (Signed)
Anesthesia Chart Review:  Patient is a 67 year old male scheduled for L1-2 PLIF on 04/20/13 by Dr. Danielle Dess.  History includes HTN, hypercholesterolemia, dementia, DM2 with peripheral neuropathy, hard of hearing, ED, smoking, C3-5 ACDF '12, left TKA, L1 kyphoplasty 12/10/12.  PCP/Cardiologist is Dr. Patty Sermons, last visit 03/26/13.  EKG on 09/14/12 showed SB, septal infarct (age undetermined).  Dr Patty Sermons felt it was stable since a prior 2012 EKG.  CXR on 05/14/12 showed no active disease. Mild hyperinflation.  Preoperative labs noted.  Anticipate that he can proceed as planned.  Velna Ochs [facility]/Anesthesiology Phone [phone] 04/15/2013 11:44 AM

## 2013-04-15 NOTE — Telephone Encounter (Signed)
Please call me today after 4:00. My  dad has to have surgery on Tuesday, and I have some to concerns to address. [phone]

## 2013-04-19 MED ORDER — CEFAZOLIN SODIUM-DEXTROSE 2-3 GM-% IV SOLR
2.0000 g | INTRAVENOUS | Status: AC
  Administered 2013-04-20: 2 g via INTRAVENOUS
  Filled 2013-04-19: qty 50

## 2013-04-20 ENCOUNTER — Inpatient Hospital Stay (HOSPITAL_COMMUNITY): Payer: Medicare Other

## 2013-04-20 ENCOUNTER — Inpatient Hospital Stay (HOSPITAL_COMMUNITY): Payer: Medicare Other | Admitting: Anesthesiology

## 2013-04-20 ENCOUNTER — Encounter (HOSPITAL_COMMUNITY): Payer: Self-pay | Admitting: Vascular Surgery

## 2013-04-20 ENCOUNTER — Encounter (HOSPITAL_COMMUNITY)
Admission: RE | Disposition: A | Discharge status: Home / Self Care | Payer: Medicare Other | Source: Ambulatory Visit | Attending: Neurological Surgery

## 2013-04-20 ENCOUNTER — Encounter (HOSPITAL_COMMUNITY): Payer: Self-pay | Admitting: Anesthesiology

## 2013-04-20 ENCOUNTER — Inpatient Hospital Stay (HOSPITAL_COMMUNITY)
Admission: RE | Admit: 2013-04-20 | Discharge: 2013-04-22 | DRG: 460 | Disposition: A | Discharge status: Home / Self Care | Payer: Medicare Other | Source: Ambulatory Visit | Attending: Neurological Surgery | Admitting: Neurological Surgery

## 2013-04-20 DIAGNOSIS — M5137 Other intervertebral disc degeneration, lumbosacral region: Secondary | ICD-10-CM | POA: Diagnosis present

## 2013-04-20 DIAGNOSIS — H9190 Unspecified hearing loss, unspecified ear: Secondary | ICD-10-CM | POA: Diagnosis present

## 2013-04-20 DIAGNOSIS — Z981 Arthrodesis status: Secondary | ICD-10-CM

## 2013-04-20 DIAGNOSIS — E785 Hyperlipidemia, unspecified: Secondary | ICD-10-CM | POA: Diagnosis present

## 2013-04-20 DIAGNOSIS — M51379 Other intervertebral disc degeneration, lumbosacral region without mention of lumbar back pain or lower extremity pain: Secondary | ICD-10-CM | POA: Diagnosis present

## 2013-04-20 DIAGNOSIS — Z96659 Presence of unspecified artificial knee joint: Secondary | ICD-10-CM

## 2013-04-20 DIAGNOSIS — G609 Hereditary and idiopathic neuropathy, unspecified: Secondary | ICD-10-CM | POA: Diagnosis present

## 2013-04-20 DIAGNOSIS — Z79899 Other long term (current) drug therapy: Secondary | ICD-10-CM

## 2013-04-20 DIAGNOSIS — M47817 Spondylosis without myelopathy or radiculopathy, lumbosacral region: Secondary | ICD-10-CM | POA: Diagnosis present

## 2013-04-20 DIAGNOSIS — M5126 Other intervertebral disc displacement, lumbar region: Principal | ICD-10-CM | POA: Diagnosis present

## 2013-04-20 DIAGNOSIS — E119 Type 2 diabetes mellitus without complications: Secondary | ICD-10-CM | POA: Diagnosis present

## 2013-04-20 DIAGNOSIS — F329 Major depressive disorder, single episode, unspecified: Secondary | ICD-10-CM | POA: Diagnosis present

## 2013-04-20 DIAGNOSIS — I10 Essential (primary) hypertension: Secondary | ICD-10-CM | POA: Diagnosis present

## 2013-04-20 DIAGNOSIS — F0390 Unspecified dementia without behavioral disturbance: Secondary | ICD-10-CM | POA: Diagnosis present

## 2013-04-20 DIAGNOSIS — F17200 Nicotine dependence, unspecified, uncomplicated: Secondary | ICD-10-CM | POA: Diagnosis present

## 2013-04-20 DIAGNOSIS — F3289 Other specified depressive episodes: Secondary | ICD-10-CM | POA: Diagnosis present

## 2013-04-20 LAB — GLUCOSE, CAPILLARY
Glucose-Capillary: 93 mg/dL (ref 70–99)
Glucose-Capillary: 106 mg/dL — ABNORMAL HIGH (ref 70–99)

## 2013-04-20 SURGERY — POSTERIOR LUMBAR FUSION 1 LEVEL
Anesthesia: General | Site: Back | Wound class: Clean

## 2013-04-20 MED ORDER — ALBUMIN HUMAN 5 % IV SOLN
INTRAVENOUS | Status: DC | PRN
  Administered 2013-04-20: 15:00:00 via INTRAVENOUS

## 2013-04-20 MED ORDER — SODIUM CHLORIDE 0.9 % IV SOLN: 250.0000 mL | INTRAVENOUS | Status: DC

## 2013-04-20 MED ORDER — MENTHOL 3 MG MT LOZG: 1.0000 | LOZENGE | OROMUCOSAL | Status: DC | PRN

## 2013-04-20 MED ORDER — ARTIFICIAL TEARS OP OINT
TOPICAL_OINTMENT | OPHTHALMIC | Status: DC | PRN
  Administered 2013-04-20: 1 via OPHTHALMIC

## 2013-04-20 MED ORDER — DOCUSATE SODIUM 100 MG PO CAPS
100.0000 mg | ORAL_CAPSULE | Freq: Two times a day (BID) | ORAL | Status: DC
  Administered 2013-04-20 – 2013-04-21 (×3): 100 mg via ORAL
  Filled 2013-04-20 (×3): qty 1

## 2013-04-20 MED ORDER — LACTATED RINGERS IV SOLN
INTRAVENOUS | Status: DC
  Administered 2013-04-20: 50 mL/h via INTRAVENOUS

## 2013-04-20 MED ORDER — LIDOCAINE HCL (CARDIAC) 20 MG/ML IV SOLN
INTRAVENOUS | Status: DC | PRN
  Administered 2013-04-20: 100 mg via INTRAVENOUS

## 2013-04-20 MED ORDER — PHENOL 1.4 % MT LIQD: 1.0000 | OROMUCOSAL | Status: DC | PRN

## 2013-04-20 MED ORDER — GLYCOPYRROLATE 0.2 MG/ML IJ SOLN
INTRAMUSCULAR | Status: DC | PRN
  Administered 2013-04-20: .6 mg via INTRAVENOUS

## 2013-04-20 MED ORDER — ACETAMINOPHEN 325 MG PO TABS: 650.0000 mg | ORAL_TABLET | ORAL | Status: DC | PRN

## 2013-04-20 MED ORDER — ROCURONIUM BROMIDE 100 MG/10ML IV SOLN
INTRAVENOUS | Status: DC | PRN
  Administered 2013-04-20: 50 mg via INTRAVENOUS

## 2013-04-20 MED ORDER — OXYCODONE-ACETAMINOPHEN 5-325 MG PO TABS
ORAL_TABLET | ORAL | Status: AC
  Filled 2013-04-20: qty 2

## 2013-04-20 MED ORDER — LIDOCAINE-EPINEPHRINE 1 %-1:100000 IJ SOLN
INTRAMUSCULAR | Status: DC | PRN
  Administered 2013-04-20: 4 mL

## 2013-04-20 MED ORDER — ONDANSETRON HCL 4 MG/2ML IJ SOLN: 4.0000 mg | INTRAMUSCULAR | Status: DC | PRN

## 2013-04-20 MED ORDER — 0.9 % SODIUM CHLORIDE (POUR BTL) OPTIME
TOPICAL | Status: DC | PRN
  Administered 2013-04-20: 1000 mL

## 2013-04-20 MED ORDER — HYDROMORPHONE HCL PF 1 MG/ML IJ SOLN
0.2500 mg | INTRAMUSCULAR | Status: DC | PRN
  Administered 2013-04-20 (×4): 0.5 mg via INTRAVENOUS

## 2013-04-20 MED ORDER — NITROGLYCERIN 0.4 MG SL SUBL: 0.4000 mg | SUBLINGUAL_TABLET | SUBLINGUAL | Status: DC | PRN

## 2013-04-20 MED ORDER — SENNA 8.6 MG PO TABS
1.0000 | ORAL_TABLET | Freq: Two times a day (BID) | ORAL | Status: DC
  Administered 2013-04-20 – 2013-04-21 (×3): 8.6 mg via ORAL
  Filled 2013-04-20 (×6): qty 1

## 2013-04-20 MED ORDER — ONDANSETRON HCL 4 MG/2ML IJ SOLN
INTRAMUSCULAR | Status: DC | PRN
  Administered 2013-04-20: 4 mg via INTRAVENOUS

## 2013-04-20 MED ORDER — METHOCARBAMOL 500 MG PO TABS
500.0000 mg | ORAL_TABLET | Freq: Four times a day (QID) | ORAL | Status: DC | PRN
  Administered 2013-04-20 – 2013-04-21 (×2): 500 mg via ORAL
  Filled 2013-04-20: qty 1

## 2013-04-20 MED ORDER — HYDROCHLOROTHIAZIDE 25 MG PO TABS
12.5000 mg | ORAL_TABLET | Freq: Every day | ORAL | Status: DC | PRN
  Filled 2013-04-20: qty 0.5

## 2013-04-20 MED ORDER — HYDROMORPHONE HCL PF 1 MG/ML IJ SOLN
INTRAMUSCULAR | Status: AC
  Filled 2013-04-20: qty 1

## 2013-04-20 MED ORDER — METFORMIN HCL 500 MG PO TABS
500.0000 mg | ORAL_TABLET | Freq: Every day | ORAL | Status: DC
  Administered 2013-04-21: 500 mg via ORAL
  Filled 2013-04-20 (×4): qty 1

## 2013-04-20 MED ORDER — DIGOXIN 250 MCG PO TABS
0.2500 mg | ORAL_TABLET | Freq: Every day | ORAL | Status: DC
  Administered 2013-04-21: 0.25 mg via ORAL
  Filled 2013-04-20 (×2): qty 1

## 2013-04-20 MED ORDER — SIMVASTATIN 40 MG PO TABS
40.0000 mg | ORAL_TABLET | Freq: Every day | ORAL | Status: DC
  Administered 2013-04-20 – 2013-04-21 (×2): 40 mg via ORAL
  Filled 2013-04-20 (×3): qty 1

## 2013-04-20 MED ORDER — THROMBIN 20000 UNITS EX SOLR
CUTANEOUS | Status: DC | PRN
  Administered 2013-04-20: 14:00:00 via TOPICAL

## 2013-04-20 MED ORDER — BISACODYL 10 MG RE SUPP: 10.0000 mg | Freq: Every day | RECTAL | Status: DC | PRN

## 2013-04-20 MED ORDER — FENTANYL CITRATE 0.05 MG/ML IJ SOLN
INTRAMUSCULAR | Status: DC | PRN
  Administered 2013-04-20 (×3): 50 ug via INTRAVENOUS
  Administered 2013-04-20: 100 ug via INTRAVENOUS
  Administered 2013-04-20 (×5): 50 ug via INTRAVENOUS

## 2013-04-20 MED ORDER — OXYCODONE HCL 5 MG/5ML PO SOLN: 5.0000 mg | Freq: Once | ORAL | Status: DC | PRN

## 2013-04-20 MED ORDER — ACETAMINOPHEN 650 MG RE SUPP: 650.0000 mg | RECTAL | Status: DC | PRN

## 2013-04-20 MED ORDER — OXYCODONE HCL 5 MG PO TABS: 5.0000 mg | ORAL_TABLET | Freq: Once | ORAL | Status: DC | PRN

## 2013-04-20 MED ORDER — DIVALPROEX SODIUM ER 500 MG PO TB24
1000.0000 mg | ORAL_TABLET | Freq: Every day | ORAL | Status: DC
  Administered 2013-04-20: 1000 mg via ORAL
  Filled 2013-04-20 (×3): qty 2

## 2013-04-20 MED ORDER — HYDROMORPHONE HCL PF 1 MG/ML IJ SOLN
0.5000 mg | INTRAMUSCULAR | Status: DC | PRN
  Administered 2013-04-20: 1 mg via INTRAVENOUS
  Filled 2013-04-20: qty 1

## 2013-04-20 MED ORDER — ALUM & MAG HYDROXIDE-SIMETH 200-200-20 MG/5ML PO SUSP: 30.0000 mL | Freq: Four times a day (QID) | ORAL | Status: DC | PRN

## 2013-04-20 MED ORDER — HYDROCODONE-ACETAMINOPHEN 5-325 MG PO TABS
1.0000 | ORAL_TABLET | Freq: Two times a day (BID) | ORAL | Status: DC | PRN
  Administered 2013-04-21 (×2): 2 via ORAL
  Filled 2013-04-20 (×2): qty 2

## 2013-04-20 MED ORDER — DONEPEZIL HCL 10 MG PO TABS
10.0000 mg | ORAL_TABLET | Freq: Every day | ORAL | Status: DC
  Administered 2013-04-21: 10 mg via ORAL
  Filled 2013-04-20 (×2): qty 1

## 2013-04-20 MED ORDER — MELOXICAM 7.5 MG PO TABS
7.5000 mg | ORAL_TABLET | Freq: Every day | ORAL | Status: DC
  Administered 2013-04-20 – 2013-04-21 (×2): 7.5 mg via ORAL
  Filled 2013-04-20 (×3): qty 1

## 2013-04-20 MED ORDER — FLUOXETINE HCL 20 MG PO CAPS
20.0000 mg | ORAL_CAPSULE | Freq: Every day | ORAL | Status: DC
  Administered 2013-04-20 – 2013-04-21 (×2): 20 mg via ORAL
  Filled 2013-04-20 (×3): qty 1

## 2013-04-20 MED ORDER — BUPIVACAINE HCL (PF) 0.5 % IJ SOLN
INTRAMUSCULAR | Status: DC | PRN
  Administered 2013-04-20: 4 mL
  Administered 2013-04-20: 20 mL

## 2013-04-20 MED ORDER — VECURONIUM BROMIDE 10 MG IV SOLR
INTRAVENOUS | Status: DC | PRN
  Administered 2013-04-20 (×3): 2 mg via INTRAVENOUS

## 2013-04-20 MED ORDER — METOCLOPRAMIDE HCL 5 MG/ML IJ SOLN: 10.0000 mg | Freq: Once | INTRAMUSCULAR | Status: DC | PRN

## 2013-04-20 MED ORDER — SODIUM CHLORIDE 0.9 % IR SOLN
Status: DC | PRN
  Administered 2013-04-20: 14:00:00

## 2013-04-20 MED ORDER — NEOSTIGMINE METHYLSULFATE 1 MG/ML IJ SOLN
INTRAMUSCULAR | Status: DC | PRN
  Administered 2013-04-20: 4 mg via INTRAVENOUS

## 2013-04-20 MED ORDER — SODIUM CHLORIDE 0.9 % IJ SOLN: 3.0000 mL | INTRAMUSCULAR | Status: DC | PRN

## 2013-04-20 MED ORDER — METHOCARBAMOL 100 MG/ML IJ SOLN
500.0000 mg | Freq: Four times a day (QID) | INTRAVENOUS | Status: DC | PRN
  Filled 2013-04-20: qty 5

## 2013-04-20 MED ORDER — SODIUM CHLORIDE 0.9 % IJ SOLN
3.0000 mL | Freq: Two times a day (BID) | INTRAMUSCULAR | Status: DC
  Administered 2013-04-20 – 2013-04-21 (×3): 3 mL via INTRAVENOUS

## 2013-04-20 MED ORDER — METHOCARBAMOL 500 MG PO TABS
ORAL_TABLET | ORAL | Status: AC
  Filled 2013-04-20: qty 1

## 2013-04-20 MED ORDER — POLYETHYLENE GLYCOL 3350 17 G PO PACK
17.0000 g | PACK | Freq: Every day | ORAL | Status: DC | PRN
  Filled 2013-04-20: qty 1

## 2013-04-20 MED ORDER — LACTATED RINGERS IV SOLN
INTRAVENOUS | Status: DC | PRN
  Administered 2013-04-20 (×2): via INTRAVENOUS

## 2013-04-20 MED ORDER — OXYCODONE-ACETAMINOPHEN 5-325 MG PO TABS
1.0000 | ORAL_TABLET | ORAL | Status: DC | PRN
  Administered 2013-04-20 – 2013-04-22 (×7): 2 via ORAL
  Filled 2013-04-20 (×6): qty 2

## 2013-04-20 MED ORDER — SODIUM CHLORIDE 0.9 % IV SOLN
INTRAVENOUS | Status: DC
  Administered 2013-04-20: 100 mL/h via INTRAVENOUS

## 2013-04-20 MED ORDER — TRAZODONE HCL 100 MG PO TABS
200.0000 mg | ORAL_TABLET | Freq: Every day | ORAL | Status: DC
  Administered 2013-04-20 – 2013-04-21 (×2): 200 mg via ORAL
  Filled 2013-04-20 (×3): qty 2

## 2013-04-20 MED ORDER — CEFAZOLIN SODIUM 1-5 GM-% IV SOLN
1.0000 g | Freq: Three times a day (TID) | INTRAVENOUS | Status: AC
  Administered 2013-04-20 – 2013-04-21 (×2): 1 g via INTRAVENOUS
  Filled 2013-04-20 (×2): qty 50

## 2013-04-20 MED ORDER — FLEET ENEMA 7-19 GM/118ML RE ENEM: 1.0000 | ENEMA | Freq: Once | RECTAL | Status: AC | PRN

## 2013-04-20 MED ORDER — PROPOFOL 10 MG/ML IV BOLUS
INTRAVENOUS | Status: DC | PRN
  Administered 2013-04-20: 200 mg via INTRAVENOUS

## 2013-04-20 SURGICAL SUPPLY — 68 items
ADH SKN CLS APL DERMABOND .7 (GAUZE/BANDAGES/DRESSINGS) ×1
ADH SKN CLS LQ APL DERMABOND (GAUZE/BANDAGES/DRESSINGS) ×1
BAG DECANTER FOR FLEXI CONT (MISCELLANEOUS) ×2 IMPLANT
BLADE SURG ROTATE 9660 (MISCELLANEOUS) ×1 IMPLANT
BUR MATCHSTICK NEURO 3.0 LAGG (BURR) ×2 IMPLANT
CANISTER SUCTION 2500CC (MISCELLANEOUS) ×2 IMPLANT
CLOTH BEACON ORANGE TIMEOUT ST (SAFETY) ×2 IMPLANT
CONT SPEC 4OZ CLIKSEAL STRL BL (MISCELLANEOUS) ×4 IMPLANT
COVER BACK TABLE 24X17X13 BIG (DRAPES) IMPLANT
COVER TABLE BACK 60X90 (DRAPES) ×2 IMPLANT
DECANTER SPIKE VIAL GLASS SM (MISCELLANEOUS) ×2 IMPLANT
DERMABOND ADHESIVE PROPEN (GAUZE/BANDAGES/DRESSINGS) ×1
DERMABOND ADVANCED (GAUZE/BANDAGES/DRESSINGS) ×1
DERMABOND ADVANCED .7 DNX12 (GAUZE/BANDAGES/DRESSINGS) ×1 IMPLANT
DERMABOND ADVANCED .7 DNX6 (GAUZE/BANDAGES/DRESSINGS) IMPLANT
DRAPE C-ARM 42X72 X-RAY (DRAPES) ×4 IMPLANT
DRAPE LAPAROTOMY 100X72X124 (DRAPES) ×2 IMPLANT
DRAPE POUCH INSTRU U-SHP 10X18 (DRAPES) ×2 IMPLANT
DRAPE PROXIMA HALF (DRAPES) IMPLANT
DRSG OPSITE POSTOP 4X6 (GAUZE/BANDAGES/DRESSINGS) ×1 IMPLANT
DRSG OPSITE POSTOP 4X8 (GAUZE/BANDAGES/DRESSINGS) ×2 IMPLANT
DURAPREP 26ML APPLICATOR (WOUND CARE) ×2 IMPLANT
ELECT REM PT RETURN 9FT ADLT (ELECTROSURGICAL) ×2
ELECTRODE REM PT RTRN 9FT ADLT (ELECTROSURGICAL) ×1 IMPLANT
GAUZE SPONGE 4X4 16PLY XRAY LF (GAUZE/BANDAGES/DRESSINGS) IMPLANT
GLOVE BIOGEL PI IND STRL 7.5 (GLOVE) IMPLANT
GLOVE BIOGEL PI IND STRL 8.5 (GLOVE) ×2 IMPLANT
GLOVE BIOGEL PI INDICATOR 7.5 (GLOVE) ×1
GLOVE BIOGEL PI INDICATOR 8.5 (GLOVE) ×2
GLOVE ECLIPSE 8.5 STRL (GLOVE) ×4 IMPLANT
GLOVE EXAM NITRILE LRG STRL (GLOVE) IMPLANT
GLOVE EXAM NITRILE MD LF STRL (GLOVE) ×1 IMPLANT
GLOVE EXAM NITRILE XL STR (GLOVE) IMPLANT
GLOVE EXAM NITRILE XS STR PU (GLOVE) IMPLANT
GLOVE SURG SS PI 7.0 STRL IVOR (GLOVE) ×2 IMPLANT
GOWN BRE IMP SLV AUR LG STRL (GOWN DISPOSABLE) IMPLANT
GOWN BRE IMP SLV AUR XL STRL (GOWN DISPOSABLE) ×1 IMPLANT
GOWN STRL REIN 2XL LVL4 (GOWN DISPOSABLE) ×4 IMPLANT
HEMOSTAT POWDER KIT SURGIFOAM (HEMOSTASIS) IMPLANT
KIT BASIN OR (CUSTOM PROCEDURE TRAY) ×2 IMPLANT
KIT ROOM TURNOVER OR (KITS) ×2 IMPLANT
NDL SPNL 18GX3.5 QUINCKE PK (NEEDLE) IMPLANT
NEEDLE HYPO 22GX1.5 SAFETY (NEEDLE) ×2 IMPLANT
NEEDLE SPNL 18GX3.5 QUINCKE PK (NEEDLE) ×2 IMPLANT
NS IRRIG 1000ML POUR BTL (IV SOLUTION) ×2 IMPLANT
PACK FOAM VITOSS 10CC (Orthopedic Implant) ×1 IMPLANT
PACK LAMINECTOMY NEURO (CUSTOM PROCEDURE TRAY) ×2 IMPLANT
PAD ARMBOARD 7.5X6 YLW CONV (MISCELLANEOUS) ×6 IMPLANT
PATTIES SURGICAL .5 X1 (DISPOSABLE) ×2 IMPLANT
PEEK PLIF NOVEL 9X25X12 (Peek) ×2 IMPLANT
ROD 35MM (Rod) ×2 IMPLANT
SCREW 40MM (Screw) ×4 IMPLANT
SCREW SET SPINAL STD HEXALOBE (Screw) ×4 IMPLANT
SPONGE GAUZE 4X4 12PLY (GAUZE/BANDAGES/DRESSINGS) ×2 IMPLANT
SPONGE LAP 4X18 X RAY DECT (DISPOSABLE) IMPLANT
SPONGE SURGIFOAM ABS GEL 100 (HEMOSTASIS) ×2 IMPLANT
SUT VIC AB 1 CT1 18XBRD ANBCTR (SUTURE) ×1 IMPLANT
SUT VIC AB 1 CT1 8-18 (SUTURE) ×4
SUT VIC AB 2-0 CP2 18 (SUTURE) ×2 IMPLANT
SUT VIC AB 3-0 SH 8-18 (SUTURE) ×3 IMPLANT
SYR 20ML ECCENTRIC (SYRINGE) ×2 IMPLANT
SYR 3ML LL SCALE MARK (SYRINGE) ×8 IMPLANT
TOWEL OR 17X24 6PK STRL BLUE (TOWEL DISPOSABLE) ×2 IMPLANT
TOWEL OR 17X26 10 PK STRL BLUE (TOWEL DISPOSABLE) ×2 IMPLANT
TRAP SPECIMEN MUCOUS 40CC (MISCELLANEOUS) ×2 IMPLANT
TRAY FOLEY CATH 14FRSI W/METER (CATHETERS) ×1 IMPLANT
TRAY FOLEY CATH 16FRSI W/METER (SET/KITS/TRAYS/PACK) ×2 IMPLANT
WATER STERILE IRR 1000ML POUR (IV SOLUTION) ×2 IMPLANT

## 2013-04-20 NOTE — Anesthesia Procedure Notes (Signed)
Procedure Name: Intubation Date/Time: 04/20/2013 1:08 PM Performed by: Luster Landsberg Pre-anesthesia Checklist: Patient identified, Emergency Drugs available, Suction available and Patient being monitored Patient Re-evaluated:Patient Re-evaluated prior to inductionOxygen Delivery Method: Circle system utilized Preoxygenation: Pre-oxygenation with 100% oxygen Intubation Type: IV induction Ventilation: Mask ventilation without difficulty and Oral airway inserted - appropriate to patient size Laryngoscope Size: Mac and 3 Grade View: Grade I Tube type: Oral Tube size: 7.5 mm Number of attempts: 1 Airway Equipment and Method: Stylet Placement Confirmation: ETT inserted through vocal cords under direct vision and breath sounds checked- equal and bilateral Secured at: 21 cm Tube secured with: Tape Dental Injury: Teeth and Oropharynx as per pre-operative assessment

## 2013-04-20 NOTE — Preoperative (Signed)
Beta Blockers   Reason not to administer Beta Blockers:Not Applicable 

## 2013-04-20 NOTE — Transfer of Care (Signed)
Immediate Anesthesia Transfer of Care Note  Patient: Alexander Booth  Procedure(s) Performed: Procedure(s) with comments: Posterior Lumbar One-Two Interbody and Fusion (N/A) - Posterior Lumbar One-Two Interbody and Fusion  Patient Location: PACU  Anesthesia Type:General  Level of Consciousness: awake, alert , patient cooperative and confused  Airway & Oxygen Therapy: Patient Spontanous Breathing and Patient connected to nasal cannula oxygen  Post-op Assessment: Report given to PACU RN, Post -op Vital signs reviewed and stable and Patient moving all extremities X 4  Post vital signs: Reviewed and stable  Complications: No apparent anesthesia complications

## 2013-04-20 NOTE — Anesthesia Preprocedure Evaluation (Addendum)
Anesthesia Evaluation  Patient identified by MRN, date of birth, ID band Patient awake    Reviewed: Allergy & Precautions, H&P , NPO status , Patient's Chart, lab work & pertinent test results, reviewed documented beta blocker date and time   Airway Mallampati: II TM Distance: >3 FB Neck ROM: full    Dental  (+) Dental Advisory Given   Pulmonary neg pulmonary ROS,  breath sounds clear to auscultation        Cardiovascular hypertension, On Medications Rhythm:regular     Neuro/Psych PSYCHIATRIC DISORDERS  Neuromuscular disease    GI/Hepatic negative GI ROS, Neg liver ROS,   Endo/Other  negative endocrine ROSdiabetes  Renal/GU negative Renal ROS  negative genitourinary   Musculoskeletal   Abdominal   Peds  Hematology negative hematology ROS (+)   Anesthesia Other Findings See surgeon's H&P   Reproductive/Obstetrics negative OB ROS                          Anesthesia Physical Anesthesia Plan  ASA: III  Anesthesia Plan: General   Post-op Pain Management:    Induction: Intravenous  Airway Management Planned: Oral ETT  Additional Equipment:   Intra-op Plan:   Post-operative Plan: Extubation in OR  Informed Consent: I have reviewed the patients History and Physical, chart, labs and discussed the procedure including the risks, benefits and alternatives for the proposed anesthesia with the patient or authorized representative who has indicated his/her understanding and acceptance.   Dental Advisory Given  Plan Discussed with: CRNA and Surgeon  Anesthesia Plan Comments:         Anesthesia Quick Evaluation

## 2013-04-20 NOTE — Op Note (Signed)
Date of surgery: 04/20/2013 Preoperative diagnosis: Herniated nucleus pulposus and degenerative disc disease with stenosis L1-L2, status post L1 compression fracture with kyphoplasty Postoperative diagnosis: Herniated nucleus pulposus and degenerative disc disease with stenosis L1-L2, status post L1 compression fracture with kyphoplasty Procedure: L1-L2 discectomy on the left with decompression of central canal and L1 and L2 nerve roots bilaterally for stenosis with work greater than require for simple posterior interbody arthrodesis, posterior lumbar interbody arthrodesis using peek spacers local autograft and allograft. Pedicle screw fixation L1-L2, posterior lateral arthrodesis L1-L2 with local autograft and allograft.  Surgeon: Barnett Abu Assistant: Lelon Perla Anesthesia: General endotracheal Indications: Alexander Booth is a 67 year old individual who sustained an L1 compression fracture about 8 months ago. He underwent an acrylic balloon kyphoplasty however after surgery he continued to have significant back pain with bilateral leg pain worse on left than on the right. Ultimately an MRI demonstrated a presence of a herniated nucleus pulposus in addition to significant degenerative changes with stenosis at L1-L2 is advised regarding the need for surgical decompression and stabilization.  Procedure: Patient was brought to the operating room supine on a stretcher. After the smooth induction of general endotracheal anesthesia, he was turned prone and the bony prominences were properly padded and protected. A midline incision was created over the region of L1 and L2 which was localized radiographically. The dissection was carried out over the transverse processes to expose the intertransverse space between L1 and L2. When hemostasis in these regions were secured the transverse processes were decorticated and packed away for later use as grafting. A laminotomy was then created removing the inferior  margin lamina of L1 out to and including the entirety of the facet at the L1-L2 complex. Redundant ligament in this region was then taken up and this left for good decompression of the central canal. We then decompressed the L1 nerve root a significant stenosis and lateral recess. This was done with 2 and 3 mm Kerrison punch a high-speed drill. The L II nerve root was then decompressed inferiorly using a similar technique. The disc space was explored. Underlying, neural tube at the area just below the disc space at L2 there was found to be several large fragments of disc material causing compression of the central canal. These were resected. Then by mobilizing the common dural tube medially I examined the disc space and noted that there was a significant tear from the inferior margin of the disc space at the L1-L2 space. This tear was opened with a 15 blade. A combination of curettes and rongeurs was then used to evacuate a significant quantity of severely degenerated and desiccated disc material. Gross total discectomy was then performed using a combination of some disc space shavers to remove endplate material into the depths of the disc space. The dissection was carried both medially and laterally. The disc space was completely evacuated. A similar dissection was then carried out on the opposite side. Again the L1 and the L2 nerve roots were prepped laterally secondary to some lateral recess stenosis. These were decompressed. A complete discectomy was then carried out on the side.  The interbody space was incised and distracted to allow it to stand in neutral position and it was felt that 12 mm spacers would fit well into the interspace. A 10 mm box cutter was used to square the ends of the endplates and to allow for good decortication and the place where the spacers were going to be placed. Once the endplates were decorticated with  the use of a toothed curette which was used both medially and laterally on both  sides to decorticate the endplates we packed autologous bone graft that was harvested from laminectomies into the interspace. A total of 12 cc of bone that was harvested was placed into the interspace. The 2 cages were then placed also. With the interspace incompletely filled. Then turned our attention to placing pedicle screws  Fluoroscopic guidance was used to place 6.5 x 40 mm pedicle screws in L1 and 6.5 x 40 mm pedicle screws in L2 each hole was first localized radiographically then performed with him all then tapped sounded and then the screw placed in each of the holes. 35mm precontoured rods were then placed between the screw heads. Prior to doing this the intertransverse spaces which had been previously decorticated were packed with the remainder of autologous bone graft in addition to some PTOT strips. The 35 mm screws were placed and locked in a neutral position. Final radiographs were obtained.  Next the wound is inspected carefully for good decompression of both the L1 and L2 nerve roots the placement of bone graft and when hemostasis and the soft tissues was verified the lumbar dorsal fascia was closed with #1 Vicryl in interrupted fashion 2-0 Vicryl was used in the subcutaneous tissues and 3-0 Vicryl was used to close the subtalar skin prior to closure of the subcutaneous tissues 20 cc of half percent Marcaine was injected into the paraspinous musculature. Dermabond was placed on the skin and then a dry sterile dressing was placed over that. She tolerated procedure well was returned to recovery room in stable condition. Blood loss was estimated at 250 cc.

## 2013-04-20 NOTE — H&P (Signed)
Alexander Booth is an 67 y.o. male.   Chief Complaint: Back pain bilateral lower extremity pain status post L1 compression fracture HPI: Patient is a 67 year old individual is had significant back pain and bilateral lower extremity pain he had an acrylic balloon kyphoplasty for L1 compression fracture. This was about 6 months ago. He's had persistent back pain an MRI now demonstrates that he is herniated the disc at L1-L2. He is failed efforts at conservative management including epidural steroid injections. Is being admitted to undergo surgical decompression and stabilization at L1-L2.  Past Medical History  Diagnosis Date  . Hypertension     Essential  . Hypercholesterolemia   . Lumbosacral disc disease     with chronic low back pain  . Bronchitis     Subacute and hx of ongoing cigarette smoking one half pack daily  . Diabetes mellitus     He's also had a hx of high cholesterol   . FHx: heart disease     Positive for   . Dyslipidemia     He also has a past hx of   . Type 2 diabetes mellitus 2010    takes oral meds  . Fracture of lumbar vertebra 11/25/2012  . Hard of hearing     very little hearing on left side;better in right ear  . Erectile dysfunction     He's had a hx of   . Depression   . Presenile dementia   . Dementia     He has a hx of the   . Peripheral neuropathy   . Peripheral neuropathy     He also has     Past Surgical History  Procedure Laterality Date  . Knee surgery      Previous left total replacement  . Hemorrhoid surgery      x2  . Other surgical history  1992    Bone Fusion in Right Foot  . Anterior cervical decomp/discectomy fusion    . Appendectomy    . Joint replacement    . Total knee arthroplasty Left   . Kyphoplasty N/A 12/10/2012    Procedure: Lumbar one Kyphoplasty;  Surgeon: Barnett Abu, MD;  Location: [facility];  Service: Neurosurgery;  Laterality: N/A;  Lumbar One Kyphoplasty    Family History  Problem Relation Age of Onset  .  Multiple myeloma Mother    Social History:  reports that he has been smoking.  He has never used smokeless tobacco. He reports that he does not drink alcohol or use illicit drugs.  Allergies:  Allergies  Allergen Reactions  . Codeine     Itch     Medications Prior to Admission  Medication Sig Dispense Refill  . digoxin (LANOXIN) 0.25 MG tablet Take 0.25 mg by mouth daily.      . divalproex (DEPAKOTE ER) 500 MG 24 hr tablet Take 2 tablets (1,000 mg total) by mouth daily.  180 tablet  3  . donepezil (ARICEPT) 10 MG tablet Take 1 tablet (10 mg total) by mouth every morning.  90 tablet  3  . FLUoxetine (PROZAC) 20 MG capsule Take 1 capsule (20 mg total) by mouth daily.  90 capsule  3  . HYDROcodone-acetaminophen (NORCO/VICODIN) 5-325 MG per tablet Take 1 tablet by mouth 2 (two) times daily as needed for pain.      . meloxicam (MOBIC) 7.5 MG tablet Take 7.5 mg by mouth daily.       . metFORMIN (GLUCOPHAGE) 500 MG tablet Take 500 mg  by mouth daily.       . nitroGLYCERIN (NITROSTAT) 0.4 MG SL tablet Place 0.4 mg under the tongue every 5 (five) minutes as needed for chest pain.      . simvastatin (ZOCOR) 40 MG tablet Take 40 mg by mouth every evening.      . traZODone (DESYREL) 100 MG tablet Take 2 tablets (200 mg total) by mouth at bedtime.  180 tablet  3  . hydrochlorothiazide (HYDRODIURIL) 25 MG tablet Take 12.5 mg by mouth daily as needed (fluid).        Results for orders placed during the hospital encounter of 04/20/13 (from the past 48 hour(s))  GLUCOSE, CAPILLARY     Status: None   Collection Time    04/20/13  9:12 AM      Result Value Range   Glucose-Capillary 96  70 - 99 mg/dL  GLUCOSE, CAPILLARY     Status: None   Collection Time    04/20/13 11:16 AM      Result Value Range   Glucose-Capillary 93  70 - 99 mg/dL   No results found.  Review of Systems  Constitutional: Negative.   HENT: Negative.   Eyes: Negative.   Respiratory: Negative.   Cardiovascular: Negative.    Gastrointestinal: Negative.   Genitourinary: Negative.   Musculoskeletal: Positive for back pain.  Skin: Negative.   Neurological: Negative.   Endo/Heme/Allergies: Negative.   Psychiatric/Behavioral: Negative.     Blood pressure 133/63, pulse 74, temperature 97.1 F (36.2 C), temperature source Oral, resp. rate 18, SpO2 98.00%. Physical Exam  Constitutional: He is oriented to person, place, and time. He appears well-developed and well-nourished.  HENT:  Head: Normocephalic and atraumatic.  Eyes: EOM are normal. Pupils are equal, round, and reactive to light.  Neck: Normal range of motion. Neck supple.  GI: Soft. Bowel sounds are normal.  Musculoskeletal:  Centralized the rectal lumbar back pain with radiation to both lower extremity  Neurological: He is alert and oriented to person, place, and time. He has normal reflexes.  Skin: Skin is warm and dry.  Psychiatric: He has a normal mood and affect. His behavior is normal. Judgment and thought content normal.     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  Decompression and arthrodesis L1-L2 with pedicle screw fixation and posterior lateral arthrodesis with local autograft and allograft.  Alexander Booth J 04/20/2013, 12:52 PM

## 2013-04-20 NOTE — Progress Notes (Signed)
Patient ID: Alexander Booth, male   DOB: 07-02-1946, 67 y.o.   MRN: [id number] Vital signs are stable. Patient feels comfortable with back.  motor function is intact in both lower extremities. Dressing is clean and dry.  They will postop

## 2013-04-21 NOTE — Progress Notes (Signed)
Subjective: Patient reports No generalized complaints back feels better than expected.  Objective: Vital signs in last 24 hours: Temp:  [97.5 F (36.4 C)-98.2 F (36.8 C)] 98 F (36.7 C) (10/01 1447) Pulse Rate:  [64-85] 80 (10/01 1447) Resp:  [17-18] 17 (10/01 1447) BP: (117-154)/(48-56) 127/48 mmHg (10/01 1447) SpO2:  [95 %-100 %] 95 % (10/01 1447)  Intake/Output from previous day: 09/30 0701 - 10/01 0700 In: 2050 [I.V.:1800; IV Piggyback:250] Out: 1540 [Urine:1340; Blood:200] Intake/Output this shift: Total I/O In: 320 [P.O.:320] Out: 300 [Urine:300]  Incision is clean and dry. Motor function is intact in lower extremities. Station and gait are intact.  Lab Results: No results found for this basename: WBC, HGB, HCT, PLT,  in the last 72 hours BMET No results found for this basename: NA, K, CL, CO2, GLUCOSE, BUN, CREATININE, CALCIUM,  in the last 72 hours  Studies/Results: Dg Lumbar Spine 2-3 Views  04/20/2013   CLINICAL DATA:  L1-2 PLIF in patient with prior history of L1 vertebroplasty.  EXAM: OPERATIVE LUMBAR SPINE 2 VIEW(S)  COMPARISON:  Intraoperative localizing lumbar spine x-rays earlier same day.  FINDINGS: 2 spot images from the C-arm fluoroscopic device, AP and lateral views of the upper lumbar spine, were submitted for interpretation post-operatively. Posterolateral fusion with hardware and interbody fusion with plugs at L1-2. Prior L1 vertebroplasty. Anatomic alignment on the lateral image. No acute complicating features.  IMPRESSION: Anatomic alignment post L1-2 PLIF and interbody fusion with plugs.   Electronically Signed   By: Hulan Saas   On: 04/20/2013 17:14   Dg Lumbar Spine 2-3 Views  04/20/2013   CLINICAL DATA:  Herniated lumbar disc.  EXAM: LUMBAR SPINE - 2-3 VIEW  COMPARISON:  MRI dated 03/31/2013  FINDINGS: Radiograph 1 demonstrates a needle at the L2-3 level. Radiograph 2 demonstrates a needle at L1-2. Radiograph 3 demonstrates an instrument at the  L1-2 level.  IMPRESSION: Instrument at L1-2.   Electronically Signed   By: Geanie Cooley   On: 04/20/2013 16:02   Dg C-arm 1-60 Min  04/20/2013   CLINICAL DATA:  L1-2 PLIF in patient with prior history of L1 vertebroplasty.  EXAM: OPERATIVE LUMBAR SPINE 2 VIEW(S)  COMPARISON:  Intraoperative localizing lumbar spine x-rays earlier same day.  FINDINGS: 2 spot images from the C-arm fluoroscopic device, AP and lateral views of the upper lumbar spine, were submitted for interpretation post-operatively. Posterolateral fusion with hardware and interbody fusion with plugs at L1-2. Prior L1 vertebroplasty. Anatomic alignment on the lateral image. No acute complicating features.  IMPRESSION: Anatomic alignment post L1-2 PLIF and interbody fusion with plugs.   Electronically Signed   By: Hulan Saas   On: 04/20/2013 17:14    Assessment/Plan: Stable postop day 1  LOS: 1 day  Mobilize as tolerated.   Almina Schul J 04/21/2013, 6:27 PM

## 2013-04-21 NOTE — Clinical Social Work Note (Signed)
CSW received consult for SNF placement. At this time pt does not have PT/OT recommendations. CSW signing off. ° °Please reconsult CSW if needed. ° °Emily [place], MSW, LCSWA °Clinical Social Work °[phone] °

## 2013-04-21 NOTE — Evaluation (Signed)
Physical Therapy Evaluation Patient Details Name: Alexander Booth MRN: [id number] DOB: 1946-04-01 Today's Date: 04/21/2013 Time: 1125-1201 PT Time Calculation (min): 36 min  PT Assessment / Plan / Recommendation History of Present Illness    67 yo presented 04/20/2013 with Herniated nucleus pulposus and degenerative disc disease with stenosis L1-L2, status post L1 compression fracture with kyphoplasty for L1-L2 discectomy on the left with decompression of central canal and L1 and L2 nerve roots bilaterally, posterior lumbar interbody arthrodesis, pedicle screw fixation L1-L2, posterior lateral arthrodesis L1-L2 with local autograft and allograft.   Clinical Impression  Pt presents at supervision level or better for all functional mobility, has good grasp of post op care and has family assist for d/c.  Ambulating without difficulty using cane with no leg pain and min-mod surgical pain (4/10).  Education completed, no indication for continued PT acutely.  Pt has baseline balance difficulty compensated for with cane and advised to discuss OPPT with surgeon to address balance function once back surgery is healed.  No other needs at this time.    PT Assessment  Patient needs continued PT services;All further PT needs can be met in the next venue of care    Follow Up Recommendations  No PT follow up (pursue OPPT for balance once MD clears postoperatively)    Does the patient have the potential to tolerate intense rehabilitation      Barriers to Discharge        Equipment Recommendations  None recommended by PT    Recommendations for Other Services     Frequency      Precautions / Restrictions Precautions Precautions: Back Precaution Booklet Issued: Yes (comment) Precaution Comments: handout for activity, movemnt precautions Required Braces or Orthoses: Spinal Brace Spinal Brace: Lumbar corset;Applied in sitting position   Pertinent Vitals/Pain 4/10 pain in back      Mobility  Bed Mobility Bed Mobility: Right Sidelying to Sit;Sit to Sidelying Right Right Sidelying to Sit: 4: Min assist;HOB flat;With rails Sit to Sidelying Right: 4: Min guard;HOB flat Details for Bed Mobility Assistance: cue for technique especially back to supine, pt able to perform without assistance once understands technique Transfers Transfers: Stand to Sit;Sit to Stand Sit to Stand: 5: Supervision Stand to Sit: 5: Supervision Details for Transfer Assistance: standby for safety Ambulation/Gait Ambulation/Gait Assistance: 5: Supervision Ambulation Distance (Feet): 100 Feet Assistive device: Straight cane Ambulation/Gait Assistance Details: standby for safety with no overt LOB noted Gait Pattern: Step-through pattern Stairs: Yes Stairs Assistance: 5: Supervision Stairs Assistance Details (indicate cue type and reason): standby, verbal cues for descent leading leg for safety due to eccentric weakness Stair Management Technique: One rail Right;With cane Number of Stairs: 5 Wheelchair Mobility Wheelchair Mobility: No    Exercises     PT Diagnosis: Difficulty walking  PT Problem List: Decreased balance;Decreased mobility;Pain PT Treatment Interventions: Balance training;Therapeutic activities;Functional mobility training     PT Goals(Current goals can be found in the care plan section) Acute Rehab PT Goals Patient Stated Goal: did not PT Goal Formulation: No goals set, d/c therapy  Visit Information  Last PT Received On: 04/21/13 Assistance Needed: +1       Prior Functioning  Home Living Family/patient expects to be discharged to:: Private residence Living Arrangements: Spouse/significant other Available Help at Discharge: Family Type of Home: House Home Access: Stairs to enter Secretary/administrator of Steps: 4 Entrance Stairs-Rails: Can reach both Home Layout: One level Home Equipment: Cane - single point Prior Function Level of Independence: Independent  with assistive  device(s) Comments: cane, history of balance problems  Communication Communication: HOH Dominant Hand: Right    Cognition  Cognition Arousal/Alertness: Awake/alert Behavior During Therapy: WFL for tasks assessed/performed Overall Cognitive Status: Within Functional Limits for tasks assessed    Extremity/Trunk Assessment Upper Extremity Assessment Upper Extremity Assessment: Overall WFL for tasks assessed Lower Extremity Assessment Lower Extremity Assessment: Overall WFL for tasks assessed   Balance Balance Balance Assessed: No High Level Balance High Level Balance Comments: Pt reports balance issues, states 'MD's been following me".  Discussed w/ pt and daughter the role and benefit for PT to address balance and defer to MD to clear for OPPT to begin rehabilitation.    End of Session PT - End of Session Equipment Utilized During Treatment: Back brace Activity Tolerance: Patient tolerated treatment well Patient left: in bed;with call bell/phone within reach;with family/visitor present Nurse Communication: Mobility status  GP     Dennis Bast 04/21/2013, 12:04 PM

## 2013-04-21 NOTE — Progress Notes (Signed)
Foley removed early this morning by HS nurse. Pt voided around 1130a today. Output = . No complaints or discomfort reported. Will continue to monitor.

## 2013-04-21 NOTE — Progress Notes (Signed)
Orthopedic Tech Progress Note Patient Details:  Rodricus Candelaria [facility] 14-Jul-1946 [id number]  Patient ID: Anne Shutter, male   DOB: March 16, 1946, 67 y.o.   MRN: [id number]   Shawnie Pons 04/21/2013, 8:46 [facility] bio-tech for lumbar fusion brace.

## 2013-04-21 NOTE — Anesthesia Postprocedure Evaluation (Signed)
Anesthesia Post Note  Patient: Alexander Booth [facility]  Procedure(s) Performed: Procedure(s) (LRB): Posterior Lumbar One-Two Interbody and Fusion (N/A)  Anesthesia type: General  Patient location: PACU  Post pain: Pain level controlled  Post assessment: Patient's Cardiovascular Status Stable  Last Vitals:  Filed Vitals:   04/21/13 0537  BP: 130/56  Pulse: 82  Temp: 36.8 C  Resp: 18    Post vital signs: Reviewed and stable  Level of consciousness: alert  Complications: No apparent anesthesia complications

## 2013-04-21 NOTE — Evaluation (Signed)
Occupational Therapy Evaluation Patient Details Name: Alexander Booth MRN: [id number] DOB: [birth year] Today's Date: 04/21/2013 Time: 1191-4782 OT Time Calculation (min): 30 min  OT Assessment / Plan / Recommendation History of present illness 67 yo male s/p L1-2 PLIF with back brace   Clinical Impression   Patient evaluated by Occupational Therapy with no further acute OT needs identified. All education has been completed and the patient has no further questions. See below for any follow-up Occupational Therapy or equipment needs. OT to sign off. Thank you for referral.      OT Assessment  Patient does not need any further OT services    Follow Up Recommendations  No OT follow up    Barriers to Discharge      Equipment Recommendations  None recommended by OT    Recommendations for Other Services    Frequency       Precautions / Restrictions Precautions Precautions: Back Precaution Booklet Issued: Yes (comment) Precaution Comments: handout provided and reviewed. Pt recalling 2 out 3 precautions Required Braces or Orthoses: Spinal Brace Spinal Brace: Lumbar corset;Applied in sitting position   Pertinent Vitals/Pain No pain reported fatigued    ADL  Eating/Feeding: Modified independent Where Assessed - Eating/Feeding: Wheelchair Lower Body Dressing: Modified independent Where Assessed - Lower Body Dressing: Unsupported sit to stand (pt fully dressed on arrival) Toilet Transfer: Modified independent Toilet Transfer Method: Sit to Barista: Regular height toilet (educated on twisting and bending with regular toilet) Transfers/Ambulation Related to ADLs: pt observed with daughter assistance to ambulate to bed and complete bed mobiity supervision level ADL Comments: Pt with ADL education with back precautions. PT and daughter educated on: positioning in car, car transfer, change of position every 45 minutes, medication schedule, positioning with pillows  for bed mobility, lb dressing to avoid precautions, and hygiene . Pt encouraged to purchase wet wipes for hygiene and avoid twisting. Pt without further questions and all education complete    OT Diagnosis:    OT Problem List:   OT Treatment Interventions:     OT Goals(Current goals can be found in the care plan section) Acute Rehab OT Goals Patient Stated Goal: to return to fishing- educated patient okay to cane pole fish soon  Visit Information  Last OT Received On: 04/21/13 Assistance Needed: +1 History of Present Illness: 67 yo male s/p L1-2 PLIF with back brace       Prior Functioning     Home Living Family/patient expects to be discharged to:: Private residence Living Arrangements: Spouse/significant other Available Help at Discharge: Family Type of Home: House Home Access: Stairs to enter Secretary/administrator of Steps: 4 Entrance Stairs-Rails: Can reach both Home Layout: One level Home Equipment: Cane - single point Prior Function Level of Independence: Independent with assistive device(s) Comments: cane, history of balance problems  Communication Communication: HOH Dominant Hand: Right         Vision/Perception Vision - History Baseline Vision: Wears glasses all the time   Cognition  Cognition Arousal/Alertness: Awake/alert Behavior During Therapy: WFL for tasks assessed/performed Overall Cognitive Status: Within Functional Limits for tasks assessed    Extremity/Trunk Assessment Upper Extremity Assessment Upper Extremity Assessment: Overall WFL for tasks assessed Lower Extremity Assessment Lower Extremity Assessment: Defer to PT evaluation Cervical / Trunk Assessment Cervical / Trunk Assessment: Normal     Mobility Bed Mobility Bed Mobility: Sit to Supine Sit to Supine: 5: Supervision;With rail Details for Bed Mobility Assistance: cues for safety with back precautions avoid twisting  Transfers Sit to Stand: 6: Modified independent  (Device/Increase time) Stand to Sit: 6: Modified independent (Device/Increase time) Details for Transfer Assistance: standby for safety     Exercise     Balance Balance Balance Assessed: No   End of Session OT - End of Session Activity Tolerance: Patient tolerated treatment well Patient left: in bed;with call bell/phone within reach Nurse Communication: Mobility status;Precautions  GO     Harolyn Rutherford 04/21/2013, 3:03 PM Pager: [phone]

## 2013-04-21 NOTE — Progress Notes (Signed)
Pt ambulated 45 ft with brace and cane and 1 assist from staff. Gait steady. Foley d/ced at this time. Will continue to monitor.

## 2013-04-22 MED ORDER — METHOCARBAMOL 500 MG PO TABS: 500.0000 mg | ORAL_TABLET | Freq: Four times a day (QID) | ORAL | Status: DC | PRN

## 2013-04-22 MED ORDER — HYDROCODONE-ACETAMINOPHEN 5-325 MG PO TABS: 1.0000 | ORAL_TABLET | Freq: Four times a day (QID) | ORAL | Status: DC | PRN

## 2013-04-22 MED FILL — Heparin Sodium (Porcine) Inj 1000 Unit/ML: INTRAMUSCULAR | Qty: 30 | Status: AC

## 2013-04-22 MED FILL — Sodium Chloride IV Soln 0.9%: INTRAVENOUS | Qty: 1000 | Status: AC

## 2013-04-22 NOTE — Plan of Care (Signed)
D/c instructions given to pt. Med script given to pt. Pt is stable to be d/c. 

## 2013-04-22 NOTE — Discharge Summary (Signed)
Physician Discharge Summary  Patient ID: Alexander Booth MRN: [id number] DOB/AGE: 69-Dec-1947 69 y.o.  Admit date: 04/20/2013 Discharge date: 04/22/2013  Admission Diagnoses: Herniated nucleus pulposus L1-L2, chronic disc degeneration with stenosis L1-L2, lumbar radiculopathy. Status post compression fracture L1 with kyphoplasty  Discharge Diagnoses: Nucleus pulposus L1-L2, chronic disc degeneration with stenosis L1-L2, lumbar radiculopathy status post compression fracture L1 with kyphoplasty. Active Problems:   * No active hospital problems. *   Discharged Condition: good  Hospital Course: Patient was admitted to undergo surgical decompression at L1-L2. He tolerated the surgery well. His incision is clean and dry. He is neurologically intact.  Consults: None  Significant Diagnostic Studies: None  Treatments: surgery: Decompression L1-L2 with total discectomy decompression of L1 and L2 nerve roots posterior interbody arthrodesis with peek spacers local autograft and allograft pedicle screw fixation L1-L2.  Discharge Exam: Blood pressure 135/80, pulse 83, temperature 97.9 F (36.6 C), temperature source Oral, resp. rate 20, height 6' (1.829 m), weight 78.38 kg (172 lb 12.7 oz), SpO2 92.00%. Incision is clean and dry station and gait are normal.  Disposition: 01-Home or Self Care  Discharge Orders   Future Appointments Provider Department Dept Phone   07/27/2013 9:30 AM Huston Foley, MD [facility] [phone]   Future Orders Complete By Expires   Call MD for:  redness, tenderness, or signs of infection (pain, swelling, redness, odor or green/yellow discharge around incision site)  As directed    Call MD for:  severe uncontrolled pain  As directed    Call MD for:  temperature >100.4  As directed    Diet - low sodium heart healthy  As directed    Discharge instructions  As directed    Comments:     Okay to shower. Do not apply salves or appointments to incision.  No heavy lifting with the upper extremities greater than 15 pounds. May resume driving when not requiring pain medication and patient feels comfortable with doing so.   Increase activity slowly  As directed        Medication List         digoxin 0.25 MG tablet  Commonly known as:  LANOXIN  Take 0.25 mg by mouth daily.     divalproex 500 MG 24 hr tablet  Commonly known as:  DEPAKOTE ER  Take 2 tablets (1,000 mg total) by mouth daily.     donepezil 10 MG tablet  Commonly known as:  ARICEPT  Take 1 tablet (10 mg total) by mouth every morning.     FLUoxetine 20 MG capsule  Commonly known as:  PROZAC  Take 1 capsule (20 mg total) by mouth daily.     hydrochlorothiazide 25 MG tablet  Commonly known as:  HYDRODIURIL  Take 12.5 mg by mouth daily as needed (fluid).     HYDROcodone-acetaminophen 5-325 MG per tablet  Commonly known as:  NORCO/VICODIN  Take 1 tablet by mouth 2 (two) times daily as needed for pain.     HYDROcodone-acetaminophen 5-325 MG per tablet  Commonly known as:  NORCO/VICODIN  Take 1-2 tablets by mouth every 6 (six) hours as needed for pain.     meloxicam 7.5 MG tablet  Commonly known as:  MOBIC  Take 7.5 mg by mouth daily.     metFORMIN 500 MG tablet  Commonly known as:  GLUCOPHAGE  Take 500 mg by mouth daily.     methocarbamol 500 MG tablet  Commonly known as:  ROBAXIN  Take 1 tablet (500 mg  total) by mouth every 6 (six) hours as needed.     nitroGLYCERIN 0.4 MG SL tablet  Commonly known as:  NITROSTAT  Place 0.4 mg under the tongue every 5 (five) minutes as needed for chest pain.     simvastatin 40 MG tablet  Commonly known as:  ZOCOR  Take 40 mg by mouth every evening.     traZODone 100 MG tablet  Commonly known as:  DESYREL  Take 2 tablets (200 mg total) by mouth at bedtime.         SignedStefani Dama 04/22/2013, 9:20 AM

## 2013-05-20 ENCOUNTER — Telehealth: Payer: Self-pay | Admitting: Cardiology

## 2013-05-20 MED ORDER — AZITHROMYCIN 250 MG PO TABS: ORAL_TABLET | ORAL | Status: DC

## 2013-05-20 NOTE — Telephone Encounter (Signed)
Will forward to  Dr. Brackbill for review 

## 2013-05-20 NOTE — Telephone Encounter (Signed)
Sent to pharmacy, tried to call patient no answer

## 2013-05-20 NOTE — Telephone Encounter (Signed)
New message    C/o sore throat, ears stopped up, coughing and chills..the patient want a Zpack called in.   [facility].  He said Dr Emmie Niemann was his primary care dr.

## 2013-05-20 NOTE — Telephone Encounter (Signed)
Okay to call in a Z-Pak

## 2013-05-21 NOTE — Telephone Encounter (Signed)
Left message Rx sent in. 

## 2013-05-27 ENCOUNTER — Other Ambulatory Visit: Payer: Self-pay

## 2013-06-13 ENCOUNTER — Other Ambulatory Visit: Payer: Self-pay

## 2013-06-13 MED ORDER — DONEPEZIL HCL 10 MG PO TABS: 10.0000 mg | ORAL_TABLET | Freq: Every morning | ORAL | Status: DC

## 2013-07-13 ENCOUNTER — Other Ambulatory Visit: Payer: Self-pay | Admitting: Neurological Surgery

## 2013-07-13 DIAGNOSIS — M545 Low back pain, unspecified: Secondary | ICD-10-CM

## 2013-07-17 ENCOUNTER — Ambulatory Visit
Admission: RE | Admit: 2013-07-17 | Discharge: 2013-07-17 | Disposition: A | Discharge status: Home / Self Care | Payer: Medicare Other | Source: Ambulatory Visit | Attending: Neurological Surgery | Admitting: Neurological Surgery

## 2013-07-17 DIAGNOSIS — M545 Low back pain, unspecified: Secondary | ICD-10-CM

## 2013-07-27 ENCOUNTER — Encounter: Payer: Self-pay | Admitting: Neurology

## 2013-07-27 ENCOUNTER — Ambulatory Visit (INDEPENDENT_AMBULATORY_CARE_PROVIDER_SITE_OTHER): Payer: Medicare PPO | Admitting: Neurology

## 2013-07-27 VITALS — BP 143/72 | HR 64 | Temp 98.1°F | Ht 73.0 in | Wt 175.0 lb

## 2013-07-27 DIAGNOSIS — S32010S Wedge compression fracture of first lumbar vertebra, sequela: Secondary | ICD-10-CM

## 2013-07-27 DIAGNOSIS — F0391 Unspecified dementia with behavioral disturbance: Secondary | ICD-10-CM

## 2013-07-27 DIAGNOSIS — F329 Major depressive disorder, single episode, unspecified: Secondary | ICD-10-CM

## 2013-07-27 DIAGNOSIS — G4700 Insomnia, unspecified: Secondary | ICD-10-CM

## 2013-07-27 DIAGNOSIS — F3289 Other specified depressive episodes: Secondary | ICD-10-CM

## 2013-07-27 DIAGNOSIS — M545 Low back pain, unspecified: Secondary | ICD-10-CM

## 2013-07-27 DIAGNOSIS — R269 Unspecified abnormalities of gait and mobility: Secondary | ICD-10-CM

## 2013-07-27 DIAGNOSIS — F03918 Unspecified dementia, unspecified severity, with other behavioral disturbance: Secondary | ICD-10-CM

## 2013-07-27 DIAGNOSIS — G609 Hereditary and idiopathic neuropathy, unspecified: Secondary | ICD-10-CM

## 2013-07-27 DIAGNOSIS — F32A Depression, unspecified: Secondary | ICD-10-CM

## 2013-07-27 MED ORDER — FLUOXETINE HCL 20 MG PO CAPS: 20.0000 mg | ORAL_CAPSULE | Freq: Every day | ORAL | Status: DC

## 2013-07-27 MED ORDER — DONEPEZIL HCL 10 MG PO TABS: 10.0000 mg | ORAL_TABLET | Freq: Every morning | ORAL | Status: DC

## 2013-07-27 MED ORDER — DIVALPROEX SODIUM ER 500 MG PO TB24: 1000.0000 mg | ORAL_TABLET | Freq: Every day | ORAL | Status: DC

## 2013-07-27 MED ORDER — TRAZODONE HCL 100 MG PO TABS: 200.0000 mg | ORAL_TABLET | Freq: Every day | ORAL | Status: DC

## 2013-07-27 NOTE — Patient Instructions (Addendum)
I think overall you are doing fairly well but I do want to suggest a few things today:  Remember to drink plenty of fluid, eat healthy meals and do not skip any meals. Try to eat protein with a every meal and eat a healthy snack such as fruit or nuts in between meals. Try to keep a regular sleep-wake schedule and try to exercise daily, particularly in the form of walking, even as little as 5-10 minutes at a time. Use your cane at all times. Do not use the tub for showering or bathing, just use the shower with a seat. Try to get a bed rail for your bed.   Engage in social activities in your community and with your family and try to keep up with current events by reading the newspaper or watching the news.   As far as your medications are concerned, I would like to suggest reducing sedation medications: please reduce your sleep medicine, trazodone 100 mg pills to 1 1/2 pills each night from 2 pills. Also, please reduce your Depakote ER from 2 pills to 1 pill each night. Please call if there is more agitation or behavioral changes, in which case we may go back up on those meds. Regarding your back pain, please discuss with Dr. Ellene Route further options for pain control.    As far as diagnostic testing: no new test from my end of things.   I would like to see you back in 3 months, sooner if we need to. Please call us with any interim questions, concerns, problems, updates or refill requests.  Our nursing staff will answer any of your questions and relay your messages to me and also relay most of my messages to you.  Our phone number is [phone]. We also have an after hours call service for urgent matters and there is a physician on-call for urgent questions. For any emergencies you know to call 911 or go to the nearest emergency room.

## 2013-07-27 NOTE — Progress Notes (Signed)
Subjective:    Patient ID: Alexander Booth is a 68 y.o. male.  HPI  Interim history:   Alexander Booth is a very pleasant 68 year old gentleman who presents for followup consultation of his memory loss and gait disorder. He is accompanied by his daughter, [name], again today. 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 He had a recent L1 compression fracture in had to have surgery for this. He had a fall with injury recently while working on his roof despite my advice not to climb on roofs or climb on ladders. I advised him to start using a rolling walker with sliders in the back because of risk for falling and worsening of his gait which certainly took a setback after his fall.  Today, his daughter reports that he fell 2 times about 2 months ago. At one time he fell in the bathtub and landed on his back and another time he fell out of bed while asleep. He has been taking trazodone 200 mg strength for a long time as well as Depakote 1000 mg each night. He is also on Prozac 20 mg once daily and continues to take the Aricept generic once daily in the morning 10 mg strength. He ran out of his Vicodin. Since his 2 falls his back pain has increased REM sleep. Thankfully he has an appointment with his neurosurgeon in 2 days from now. He had a repeat lumbar spine MRI on 07/17/2013 and I reviewed the test results with him and his daughter today: Interval L1-2 fusion. The central canal and foramina are open. No evidence of complication from surgery. The patient's examination is otherwise unchanged. No change in extra foraminal disc bulging on the left at L4-5 contacting the exited left L4 root.  No change in a right lateral recess narrowing at L5-S1 with mild encroachment on the descending right S1 root. There is also mild  right foraminal narrowing at this level. I personally reviewed the images  through the PACS system; thankfully, there seemed to be no complications after the surgery and after his recent falls. Nevertheless, he is in tremendous amount of back pain and cannot sit still or stand or walk for any period of time. He sleeps a lot during the day and he finds most relief while lying down. His memory and his walking is stable otherwise. He walks with a single prong cane.  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 I discussed his recent brain MRI and lumbar spine MRI results at the time. He had evidence of chronic white matter changes and we talked about prevention of cardiovascular disease at the time. He had stopped taking his metformin about 2-3 weeks prior to his June visit with me. He was advised to continue to measure his capillary blood glucose levels. I felt that his gait disorder and balance problems for most likely multifactorial in nature, due to chronic back pain, neuropathy and atherosclerotic changes. He had a fall with injury when he had climbed on the roof despite my advice not to climb on any heights. He was re-educated in that regard and also advised not to use any heavy machinery. He also has a history of significant hearing loss. He previously followed with Dr. Morene Antu since 2004. I first met him on 09/29/2012 at which time I kept him  on the same medications. He was advised to get a repeat brain MRI.  He has an underlying medical history of hypertension, diabetes, hyperlipidemia and has progressive memory loss with some interval changes. Neuropsychological testing was done in December 2004 which showed full-scale IQ of 29, verbal IQ of 17, a performance IQ of 6. MMSE was in the 28 range. MRI brain in 2004 was normal a PET scan was positive for Alzheimer's, with normal TSH, RPR, HIV, B12 and folate. He did have a borderline low B12. He had a normal methylmalonic acid level. Sleep study  showed mild to moderate OSA but he could not tolerate CPAP. He has been on Aricept since March 2005. He has had burning foot pain for which gabapentin and Lyrica were tried without help. He had neck surgery in October 2012 with anterior cervical discectomy and fusion. In March 2013 his MMSE was 24, clock drawing was 4, and normal fluency was 11. In January his MMSE was 23, clock drawing was 4, animal fluency was 9. His recent brain MRI showed a nonspecific posterior right parietal lobe lesion without contrast uptake or edema. A followup MRI was recommended. He had a brain MRI with and without contrast on 12/03/12, which showed nonspecific WM changes.    His Past Medical History Is Significant For: Past Medical History  Diagnosis Date  . Hypertension     Essential  . Hypercholesterolemia   . Lumbosacral disc disease     with chronic low back pain  . Bronchitis     Subacute and hx of ongoing cigarette smoking one half pack daily  . Diabetes mellitus     He's also had a hx of high cholesterol   . FHx: heart disease     Positive for   . Dyslipidemia     He also has a past hx of   . Type 2 diabetes mellitus 2010    takes oral meds  . Fracture of lumbar vertebra 11/25/2012  . Hard of hearing     very little hearing on left side;better in right ear  . Erectile dysfunction     He's had a hx of   . Depression   . Presenile dementia   . Dementia     He has a hx of the   . Peripheral neuropathy   . Peripheral neuropathy     He also has     His Past Surgical History Is Significant For: Past Surgical History  Procedure Laterality Date  . Knee surgery      Previous left total replacement  . Hemorrhoid surgery      x2  . Other surgical history  1992    Bone Fusion in Right Foot  . Anterior cervical decomp/discectomy fusion    . Appendectomy    . Joint replacement    . Total knee arthroplasty Left   . Kyphoplasty N/A 12/10/2012    Procedure: Lumbar one Kyphoplasty;  Surgeon: Kristeen Miss, MD;  Location: [place] [facility];  Service: Neurosurgery;  Laterality: N/A;  Lumbar One Kyphoplasty    His Family History Is Significant For: Family History  Problem Relation Age of Onset  . Multiple myeloma Mother     His Social History Is Significant For: History   Social History  . Marital Status: Married    Spouse Name: N/A    Number of Children: N/A  . Years of Education: N/A   Social History Main Topics  . Smoking status: Current Every Day Smoker --  1.00 packs/day for 60 years  . Smokeless tobacco: Never Used  . Alcohol Use: No  . Drug Use: No  . Sexual Activity: None   Other Topics Concern  . None   Social History Narrative  . None    His Allergies Are:  Allergies  Allergen Reactions  . Codeine     Itch   :   His Current Medications Are:  Outpatient Encounter Prescriptions as of 07/27/2013  Medication Sig  . azithromycin (ZITHROMAX) 250 MG tablet 2 tablets on day 1 and then 1 daily until finished  . digoxin (LANOXIN) 0.25 MG tablet Take 0.25 mg by mouth daily.  . divalproex (DEPAKOTE ER) 500 MG 24 hr tablet Take 2 tablets (1,000 mg total) by mouth daily.  Marland Kitchen donepezil (ARICEPT) 10 MG tablet Take 1 tablet (10 mg total) by mouth every morning.  Marland Kitchen FLUoxetine (PROZAC) 20 MG capsule Take 1 capsule (20 mg total) by mouth daily.  . hydrochlorothiazide (HYDRODIURIL) 25 MG tablet Take 12.5 mg by mouth daily as needed (fluid).  Marland Kitchen HYDROcodone-acetaminophen (NORCO/VICODIN) 5-325 MG per tablet Take 1 tablet by mouth 2 (two) times daily as needed for pain.  Marland Kitchen HYDROcodone-acetaminophen (NORCO/VICODIN) 5-325 MG per tablet Take 1-2 tablets by mouth every 6 (six) hours as needed for pain.  . meloxicam (MOBIC) 7.5 MG tablet Take 7.5 mg by mouth daily.   . metFORMIN (GLUCOPHAGE) 500 MG tablet Take 500 mg by mouth daily.   . methocarbamol (ROBAXIN) 500 MG tablet Take 1 tablet (500 mg total) by mouth every 6 (six) hours as needed.  . nitroGLYCERIN (NITROSTAT) 0.4 MG SL tablet  Place 0.4 mg under the tongue every 5 (five) minutes as needed for chest pain.  . simvastatin (ZOCOR) 40 MG tablet Take 40 mg by mouth every evening.  . traZODone (DESYREL) 100 MG tablet Take 2 tablets (200 mg total) by mouth at bedtime.  :  Review of Systems:  Out of a complete 14 point review of systems, all are reviewed and negative with the exception of these symptoms as listed below:   Review of Systems  Constitutional: Positive for chills, diaphoresis, activity change, appetite change and fatigue.  HENT: Positive for hearing loss and tinnitus.   Eyes: Positive for pain and itching.  Respiratory: Negative.   Cardiovascular: Negative.   Gastrointestinal: Positive for abdominal pain.  Musculoskeletal: Positive for arthralgias, back pain, gait problem, myalgias and neck pain.  Skin: Negative.   Allergic/Immunologic: Negative.   Neurological: Negative.   Hematological: Negative.   Psychiatric/Behavioral: Positive for suicidal ideas, confusion, sleep disturbance (waking at 2 AM; going to bed at 8 or 9.) and agitation. The patient is nervous/anxious.     Objective:  Neurologic Exam  Physical Exam Physical Examination:   Filed Vitals:   07/27/13 0916  BP: 143/72  Pulse: 64  Temp: 98.1 F (36.7 C)    General Examination: The patient is a very pleasant 68 y.o. male in no acute distress. He appears frail and thinner than before and is adequately groomed. He is somewhat anxious appearing today.  HEENT exam: Normocephalic, atraumatic, pupils are equal, round and reactive to light and accommodation. Extraocular tracking is mildly impaired. He is hard of hearing, which is unchanged. Funduscopic exam is unremarkable. Neck is supple but there is decrease in range of motion passively. He is status post 2 neck surgeries. He has no facial masking and speech is clear but scant. He has a slight head tremor. Oropharynx exam shows mild to moderate mouth  dryness. Tongue protrudes centrally and  palate elevates symmetrically.  Chest is clear to auscultation without wheezing, rhonchi or crackles noted. Heart sounds are normal without murmurs, rubs or gallops. Abdomen is soft, nontender with normal bowel sounds appreciated. Skin is dry. He does not have any obvious joint deformities but complains of lower back pain. He had left knee replacement surgery. Neurologically: Mental status: The patient is awake, alert and oriented to self, circumstance. I did not repeat the MMSE today. His last score was 23/30 in January. Cranial nerves are as described above. Motor exam reveals normal bulk and tone. He has no drift, or rebound. He is a very mild postural and action tremor in both upper extremities. Reflexes are 1+ in the upper extremities 2+ in the right knee and absent in the left knee. He has absent ankle jerks. Cerebellar testing shows no dysmetria or intention tremor. Sensory exam is intact to light touch, but decrease to pinprick and the upper extremities, but he has decreased sensation to pinprick, vibration and temperature in the distal lower extremities to mid shin areas bilaterally, unchanged. Romberg testing shows significant swaying. His gait shows decreased stride length and pace and decrease in arm swing and he is more insecure with his gait today, also c/o radiating LBP while standing and walking. His posture is more stooped today. He turns in 3 steps and is a little wobbly while turning. His balance is mildly impaired. He walks with a single prong cane, and maneuvers it well.  Assessment and Plan:   In summary, Mr. Harrower is a very pleasant 68 year old gentleman with a history of degenerative joint disease, neuropathy, chronic back pain and dementia most likely of the Alzheimer's type but also possible superimposed vascular dementia with behavioral changes in the past. His brain MRI in May of this year showed chronic white matter changes and we talked about prevention of cardiovascular d/s  and modifying risk factors before. He is back on his metformin and his recent CBGs are in the 70s to 100s per daughter. He complains of significant back pain and that is his  Major complaint at this time had surgery for L1 compression fracture and has an appointment with Dr. Ellene Route this week.  I had a long chat with the patient and his daughter again today. I have advised him not to use the top for bathing or showering and just use the stand and shower but use a seat. He has an inbuilt seat in the shower. He is advised to get a bed rail which should be short enough to give a physical resistance but not too long to make sure he can still swing his legs around to get out of bed. I worry about his sedating medications especially since he is very sleepy during the day at times and sleeps a lot according to the daughter. I would like to scale back on his trazodone to 1-1/2 pills for a total of 150 mg as well as his Depakote ER from 1000 mg to 500 mg. If he has escalation in agitation or behavioral changes we can go back on the Depakote but I really would like to stay below or at 150 with the trazodone if possible. In particular, if he gets another prescription for Norco or Vicodin he may be more sedated and off-balance because of medication side effect. I certainly understand that he is in significant amount of pain and they will discuss this with Dr. Ellene Route this week. Perhaps he is a candidate for  another round of physical therapy and may be a candidate for a TENS unit, I wonder. I  renewed his prescriptions for Aricept, Prozac, trazodone and Depakote today for 90 day supply with 3 refills. He is advised to return in 3 months, sooner if the need arises. They were in agreement. Most of my 30 minute visit today was spent in counseling and coordination of care, reviewing test results and reviewing medication.

## 2013-09-01 ENCOUNTER — Other Ambulatory Visit: Payer: Self-pay | Admitting: Cardiology

## 2013-09-01 DIAGNOSIS — E119 Type 2 diabetes mellitus without complications: Secondary | ICD-10-CM

## 2013-09-13 ENCOUNTER — Other Ambulatory Visit: Payer: Self-pay | Admitting: *Deleted

## 2013-09-13 MED ORDER — DIGOXIN 250 MCG PO TABS: 0.2500 mg | ORAL_TABLET | Freq: Every day | ORAL | Status: DC

## 2013-09-13 MED ORDER — SIMVASTATIN 40 MG PO TABS: 40.0000 mg | ORAL_TABLET | Freq: Every evening | ORAL | Status: DC

## 2013-09-15 ENCOUNTER — Ambulatory Visit: Payer: Medicare Other | Admitting: Cardiology

## 2013-10-26 ENCOUNTER — Ambulatory Visit (INDEPENDENT_AMBULATORY_CARE_PROVIDER_SITE_OTHER): Payer: Commercial Managed Care - HMO | Admitting: Internal Medicine

## 2013-10-26 ENCOUNTER — Telehealth: Payer: Self-pay | Admitting: Internal Medicine

## 2013-10-26 ENCOUNTER — Encounter: Payer: Self-pay | Admitting: Internal Medicine

## 2013-10-26 VITALS — BP 106/62 | HR 58 | Temp 97.9°F | Ht 72.5 in | Wt 176.2 lb

## 2013-10-26 DIAGNOSIS — E785 Hyperlipidemia, unspecified: Secondary | ICD-10-CM

## 2013-10-26 DIAGNOSIS — G8929 Other chronic pain: Secondary | ICD-10-CM | POA: Insufficient documentation

## 2013-10-26 DIAGNOSIS — Z125 Encounter for screening for malignant neoplasm of prostate: Secondary | ICD-10-CM

## 2013-10-26 DIAGNOSIS — F0390 Unspecified dementia without behavioral disturbance: Secondary | ICD-10-CM

## 2013-10-26 DIAGNOSIS — M549 Dorsalgia, unspecified: Secondary | ICD-10-CM

## 2013-10-26 DIAGNOSIS — I10 Essential (primary) hypertension: Secondary | ICD-10-CM

## 2013-10-26 DIAGNOSIS — E119 Type 2 diabetes mellitus without complications: Secondary | ICD-10-CM

## 2013-10-26 LAB — LIPID PANEL
CHOL/HDL RATIO: 5
Cholesterol: 157 mg/dL (ref 0–200)
HDL: 32.7 mg/dL — AB (ref >39.00)
LDL Cholesterol: 66 mg/dL (ref 0–99)
Triglycerides: 292 mg/dL — ABNORMAL HIGH (ref 0.0–149.0)
VLDL: 58.4 mg/dL — AB (ref 0.0–40.0)

## 2013-10-26 LAB — COMPREHENSIVE METABOLIC PANEL
ALK PHOS: 46 U/L (ref 39–117)
ALT: 9 U/L (ref 0–53)
AST: 15 U/L (ref 0–37)
Albumin: 3.9 g/dL (ref 3.5–5.2)
BILIRUBIN TOTAL: 0.4 mg/dL (ref 0.3–1.2)
BUN: 11 mg/dL (ref 6–23)
CO2: 31 meq/L (ref 19–32)
CREATININE: 0.9 mg/dL (ref 0.4–1.5)
Calcium: 9.7 mg/dL (ref 8.4–10.5)
Chloride: 102 mEq/L (ref 96–112)
GFR: 87.53 mL/min (ref >60.00)
Glucose, Bld: 84 mg/dL (ref 70–99)
Potassium: 4.7 mEq/L (ref 3.5–5.1)
SODIUM: 139 meq/L (ref 135–145)
TOTAL PROTEIN: 6.4 g/dL (ref 6.0–8.3)

## 2013-10-26 LAB — CBC
HEMATOCRIT: 41.9 % (ref 39.0–52.0)
HEMOGLOBIN: 14.7 g/dL (ref 13.0–17.0)
MCHC: 35 g/dL (ref 30.0–36.0)
MCV: 97.4 fl (ref 78.0–100.0)
Platelets: 141 10*3/uL — ABNORMAL LOW (ref 150.0–400.0)
RBC: 4.3 Mil/uL (ref 4.22–5.81)
RDW: 12.6 % (ref 11.5–14.6)
WBC: 6.6 10*3/uL (ref 4.5–10.5)

## 2013-10-26 LAB — PSA, MEDICARE: PSA: 0.61 ng/ml (ref 0.10–4.00)

## 2013-10-26 LAB — HEMOGLOBIN A1C: Hgb A1c MFr Bld: 5.9 % (ref 4.6–6.5)

## 2013-10-26 NOTE — Assessment & Plan Note (Signed)
Will recheck lipid profile and LFT's today He does meet with Dr. Mare Ferrari next week

## 2013-10-26 NOTE — Telephone Encounter (Signed)
Relevant patient education assigned to patient using Emmi. ° °

## 2013-10-26 NOTE — Patient Instructions (Addendum)

## 2013-10-26 NOTE — Assessment & Plan Note (Signed)
On aricept No major concerns at this time

## 2013-10-26 NOTE — Progress Notes (Signed)
Pre visit review using our clinic review tool, if applicable. No additional management support is needed unless otherwise documented below in the visit note. 

## 2013-10-26 NOTE — Assessment & Plan Note (Signed)
Will recheck A1C today Will continue Metformin at this time

## 2013-10-26 NOTE — Assessment & Plan Note (Signed)
Well controlled Will follow up with Dr. Mare Ferrari

## 2013-10-26 NOTE — Progress Notes (Signed)
HPI Pt presents to the clinic today to establish care. He does not have a PCP. He does seen cardiology and neurology on a regular basis. He has no concerns today.  Flu: 2014 Tetanus: 2014 Zostovax: 2014 Pneumovax: 2014 Eye Doctor: yearly Dentist: biannually Colonsocopy: 2013 PSA screening: yearly  Past Medical History  Diagnosis Date  . Hypertension     Essential  . Hypercholesterolemia   . Lumbosacral disc disease     with chronic low back pain  . Bronchitis     Subacute and hx of ongoing cigarette smoking one half pack daily  . Diabetes mellitus     He's also had a hx of high cholesterol   . FHx: heart disease     Positive for   . Dyslipidemia     He also has a past hx of   . Type 2 diabetes mellitus 2010    takes oral meds  . Fracture of lumbar vertebra 11/25/2012  . Hard of hearing     very little hearing on left side;better in right ear  . Erectile dysfunction     He's had a hx of   . Depression   . Presenile dementia   . Dementia     He has a hx of the   . Peripheral neuropathy   . Peripheral neuropathy     He also has     Current Outpatient Prescriptions  Medication Sig Dispense Refill  . digoxin (LANOXIN) 0.25 MG tablet Take 1 tablet (0.25 mg total) by mouth daily.  90 tablet  0  . divalproex (DEPAKOTE ER) 500 MG 24 hr tablet Take 2 tablets (1,000 mg total) by mouth daily.  180 tablet  3  . donepezil (ARICEPT) 10 MG tablet Take 1 tablet (10 mg total) by mouth every morning.  90 tablet  3  . FLUoxetine (PROZAC) 20 MG capsule Take 1 capsule (20 mg total) by mouth daily.  90 capsule  3  . meloxicam (MOBIC) 7.5 MG tablet Take 7.5 mg by mouth daily.       . metFORMIN (GLUCOPHAGE) 500 MG tablet TAKE 1 TABLETS BY MOUTH TWICE DAILY      . methocarbamol (ROBAXIN) 500 MG tablet Take 1 tablet (500 mg total) by mouth every 6 (six) hours as needed.  60 tablet  3  . nitroGLYCERIN (NITROSTAT) 0.4 MG SL tablet Place 0.4 mg under the tongue every 5 (five) minutes as needed  for chest pain.      . simvastatin (ZOCOR) 40 MG tablet Take 1 tablet (40 mg total) by mouth every evening.  90 tablet  0  . traZODone (DESYREL) 100 MG tablet Take 2 tablets (200 mg total) by mouth at bedtime.  180 tablet  3  . [DISCONTINUED] sildenafil (VIAGRA) 50 MG tablet Take 1 tablet (50 mg total) by mouth as needed.  5 tablet  3   No current facility-administered medications for this visit.    Allergies  Allergen Reactions  . Codeine     Itch     Family History  Problem Relation Age of Onset  . Multiple myeloma Mother   . Diabetes Mother   . Cancer Sister     lymphoma  . Diabetes Sister   . Stroke Neg Hx     History   Social History  . Marital Status: Married    Spouse Name: N/A    Number of Children: N/A  . Years of Education: N/A   Occupational History  . Not on  file.   Social History Main Topics  . Smoking status: Current Every Day Smoker -- 1.00 packs/day for 60 years  . Smokeless tobacco: Never Used  . Alcohol Use: No  . Drug Use: No  . Sexual Activity: Not on file   Other Topics Concern  . Not on file   Social History Narrative  . No narrative on file    ROS:  Constitutional: Denies fever, malaise, fatigue, headache or abrupt weight changes.  Respiratory: Denies difficulty breathing, shortness of breath, cough or sputum production.   Cardiovascular: Denies chest pain, chest tightness, palpitations or swelling in the hands or feet.   No other specific complaints in a complete review of systems (except as listed in HPI above).  PE:  BP 106/62  Pulse 58  Temp(Src) 97.9 F (36.6 C) (Oral)  Ht 6' 0.5" (1.842 m)  Wt 176 lb 4 oz (79.946 kg)  BMI 23.56 kg/m2  SpO2 98% Wt Readings from Last 3 Encounters:  10/26/13 176 lb 4 oz (79.946 kg)  07/27/13 175 lb (79.379 kg)  04/21/13 172 lb 12.7 oz (78.38 kg)    General: Appears his stated age, chronically ill appearing in NAD. Cardiovascular: Normal rate and rhythm. S1,S2 noted.  No murmur, rubs  or gallops noted. No JVD or BLE edema. No carotid bruits noted. Pulmonary/Chest: Normal effort and positive vesicular breath sounds. No respiratory distress. No wheezes, rales or ronchi noted.   BMET    Component Value Date/Time   NA 137 04/14/2013 1450   K 4.0 04/14/2013 1450   CL 101 04/14/2013 1450   CO2 30 04/14/2013 1450   GLUCOSE 94 04/14/2013 1450   BUN 6 04/14/2013 1450   CREATININE 0.78 04/14/2013 1450   CREATININE 0.72 03/26/2013 1608   CALCIUM 9.4 04/14/2013 1450   GFRNONAA >90 04/14/2013 1450   GFRAA >90 04/14/2013 1450    Lipid Panel     Component Value Date/Time   CHOL 122 03/26/2013 1608   TRIG 259* 03/26/2013 1608   HDL 31* 03/26/2013 1608   CHOLHDL 3.9 03/26/2013 1608   VLDL 52* 03/26/2013 1608   LDLCALC 39 03/26/2013 1608    CBC    Component Value Date/Time   WBC 7.8 04/14/2013 1450   RBC 4.31 04/14/2013 1450   HGB 14.7 04/14/2013 1450   HCT 42.0 04/14/2013 1450   PLT 143* 04/14/2013 1450   MCV 97.4 04/14/2013 1450   MCH 34.1* 04/14/2013 1450   MCHC 35.0 04/14/2013 1450   RDW 12.2 04/14/2013 1450    Hgb A1C Lab Results  Component Value Date   HGBA1C 5.7 03/30/2013     Assessment and Plan:

## 2013-10-27 ENCOUNTER — Telehealth: Payer: Self-pay | Admitting: Internal Medicine

## 2013-10-27 NOTE — Telephone Encounter (Signed)
Relevant patient education assigned to patient using Emmi. ° °

## 2013-11-08 ENCOUNTER — Telehealth: Payer: Self-pay

## 2013-11-08 NOTE — Telephone Encounter (Signed)
Relevant patient education assigned to patient using Emmi. ° °

## 2013-11-09 DIAGNOSIS — M5136 Other intervertebral disc degeneration, lumbar region: Secondary | ICD-10-CM | POA: Insufficient documentation

## 2013-11-15 ENCOUNTER — Ambulatory Visit (INDEPENDENT_AMBULATORY_CARE_PROVIDER_SITE_OTHER): Payer: Commercial Managed Care - HMO | Admitting: Cardiology

## 2013-11-15 ENCOUNTER — Encounter: Payer: Self-pay | Admitting: Cardiology

## 2013-11-15 VITALS — BP 136/75 | HR 60 | Ht 72.0 in | Wt 179.4 lb

## 2013-11-15 DIAGNOSIS — E13610 Other specified diabetes mellitus with diabetic neuropathic arthropathy: Secondary | ICD-10-CM

## 2013-11-15 DIAGNOSIS — E1349 Other specified diabetes mellitus with other diabetic neurological complication: Secondary | ICD-10-CM

## 2013-11-15 DIAGNOSIS — F0390 Unspecified dementia without behavioral disturbance: Secondary | ICD-10-CM

## 2013-11-15 DIAGNOSIS — I119 Hypertensive heart disease without heart failure: Secondary | ICD-10-CM

## 2013-11-15 DIAGNOSIS — M545 Low back pain, unspecified: Secondary | ICD-10-CM

## 2013-11-15 DIAGNOSIS — E7800 Pure hypercholesterolemia, unspecified: Secondary | ICD-10-CM

## 2013-11-15 DIAGNOSIS — E119 Type 2 diabetes mellitus without complications: Secondary | ICD-10-CM

## 2013-11-15 MED ORDER — NITROGLYCERIN 0.4 MG SL SUBL: 0.4000 mg | SUBLINGUAL_TABLET | SUBLINGUAL | Status: DC | PRN

## 2013-11-15 MED ORDER — DIGOXIN 125 MCG PO TABS: 0.1250 mg | ORAL_TABLET | Freq: Every day | ORAL | Status: DC

## 2013-11-15 NOTE — Assessment & Plan Note (Signed)
His dementia appears to be worse.  He lives with his wife.  She is his caregiver as is his daughter. The patient continues to smoke about a pack of cigarettes a day against advice.

## 2013-11-15 NOTE — Progress Notes (Signed)
[place] Date of Birth:  09/02/1945 [address] [place], [place]  [address] [phone]  Fax   [phone]  HPI: This pleasant 68 year old gentleman is seen for a scheduled four-month followup office visit. Marland Kitchen He has a history of hypertension and hypercholesterolemia. The patient also has a history of dementia and a history of diabetes mellitus. His last visit he has not had any new cardiac symptoms. He denies any chest pain or shortness of breath. Patient has had occasional dizzy spells. He sometimes gets lightheaded if he stands up quickly.  The patient has had an improved appetite and his weight is up 10 pounds since last visit.  He continues to have intractable low back pain 24/7 and continues to followup with his neurosurgeon Dr. Ellene Route and his pain clinic.  The patient is now starting water therapy at the [facility] for his back.   Current Outpatient Prescriptions  Medication Sig Dispense Refill  . digoxin (LANOXIN) 0.125 MG tablet Take 1 tablet (0.125 mg total) by mouth daily.  90 tablet  3  . divalproex (DEPAKOTE ER) 500 MG 24 hr tablet Take 2 tablets (1,000 mg total) by mouth daily.  180 tablet  3  . donepezil (ARICEPT) 10 MG tablet Take 1 tablet (10 mg total) by mouth every morning.  90 tablet  3  . FLUoxetine (PROZAC) 20 MG capsule Take 1 capsule (20 mg total) by mouth daily.  90 capsule  3  . meloxicam (MOBIC) 7.5 MG tablet Take 7.5 mg by mouth daily.       . metFORMIN (GLUCOPHAGE) 500 MG tablet TAKE 1 TABLETS BY MOUTH TWICE DAILY      . methocarbamol (ROBAXIN) 500 MG tablet Take 1 tablet (500 mg total) by mouth every 6 (six) hours as needed.  60 tablet  3  . nitroGLYCERIN (NITROSTAT) 0.4 MG SL tablet Place 1 tablet (0.4 mg total) under the tongue every 5 (five) minutes as needed for chest pain.  25 tablet  prn  . simvastatin (ZOCOR) 40 MG tablet Take 1 tablet (40 mg total) by mouth every evening.  90 tablet  0  . traZODone (DESYREL) 100 MG tablet Take 2  tablets (200 mg total) by mouth at bedtime.  180 tablet  3  . [DISCONTINUED] sildenafil (VIAGRA) 50 MG tablet Take 1 tablet (50 mg total) by mouth as needed.  5 tablet  3   No current facility-administered medications for this visit.    Allergies  Allergen Reactions  . Codeine     Itch     Patient Active Problem List   Diagnosis Date Noted  . Chronic back pain 10/26/2013  . L1 vertebral fracture 12/10/2012  . Weight loss, unintentional 05/14/2012  . 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 Benign hypertensive heart disease without heart failure 06/18/2011  . DM2 (diabetes mellitus, type 2) 06/18/2011  . Pure hypercholesterolemia 06/18/2011  . Osteoarthritis 06/18/2011  . 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 Current Every Day Smoker -- 1.00 packs/day for 60 years  Smokeless tobacco  . Never Used    History  Alcohol Use No    Family History  Problem Relation Age of Onset  . Multiple myeloma Mother   . Diabetes Mother   . Cancer Sister     lymphoma  . Diabetes Sister   . Stroke Neg Hx     Review of Systems: The patient denies any heat or cold intolerance.  No weight  gain or weight loss.  The patient denies headaches or blurry vision.  There is no cough or sputum production.  The patient denies dizziness.  There is no hematuria or hematochezia.  The patient denies any muscle aches or arthritis.  The patient denies any rash.  The patient denies frequent falling or instability.  There is no history of depression or anxiety.  All other systems were reviewed and are negative.   Physical Exam: Filed Vitals:   11/15/13 0903  BP: 136/75  Pulse: 60   the general appearance reveals a chronically ill-appearing gentleman.The head and neck exam reveals pupils equal and reactive.  Extraocular movements are full.  There is no scleral icterus.  The mouth and pharynx are normal.  The neck is supple.  The carotids reveal no bruits.  The  jugular venous pressure is normal.  The  thyroid is not enlarged.  There is no lymphadenopathy.  The chest is clear to percussion and auscultation.  There are no rales or rhonchi.  Expansion of the chest is symmetrical.  The precordium is quiet.  The first heart sound is normal.  The second heart sound is physiologically split.  There is no murmur gallop rub or click.  There is no abnormal lift or heave.  The abdomen is soft and nontender.  The bowel sounds are normal.  The liver and spleen are not enlarged.  There are no abdominal masses.  There are no abdominal bruits.  Extremities reveal good pedal pulses.  There is no phlebitis or edema.  There is no cyanosis or clubbing.  Strength is normal and symmetrical in all extremities.  There is no lateralizing weakness.  There are no sensory deficits.  The skin is warm and dry.  There is no rash.      Assessment / Plan: Continue on same medication except decrease digoxin to 0 0.125 mg daily. We refilled his sublingual nitroglycerin. He was again urged to quit smoking.  This might slow down his dementia as well. Continue under the care of Dr. Ellene Route office for his back pain. Recheck in 6 months for office visit EKG lipid panel hepatic function panel and basal metabolic panel. He now has a PCP Webb Silversmith, NP

## 2013-11-15 NOTE — Assessment & Plan Note (Signed)
No chest pain.  No shortness of breath.  No palpitations.  We are cutting back on his digoxin to just 0.125 mg daily which he previously was on for his symptomatic PVCs

## 2013-11-15 NOTE — Patient Instructions (Signed)
DECREASE YOUR LANOXIN (DIGOXIN) TO 0.125 MG DAILY, new Rx sent to pharmacy   Your physician wants you to follow-up in: 6 months with fasting labs (lp/bmet/hfp) and ekg  You will receive a reminder letter in the mail two months in advance. If you don't receive a letter, please call our office to schedule the follow-up appointment.

## 2013-11-15 NOTE — Assessment & Plan Note (Signed)
The patient has very poor balance.  He has constant pain in his back and his legs.

## 2013-11-15 NOTE — Assessment & Plan Note (Signed)
The patient does not appear to be having any myalgias from his simvastatin.  He had recent labs with his PCP.

## 2013-12-09 ENCOUNTER — Telehealth: Payer: Self-pay | Admitting: Internal Medicine

## 2013-12-09 ENCOUNTER — Ambulatory Visit (INDEPENDENT_AMBULATORY_CARE_PROVIDER_SITE_OTHER): Payer: Medicare PPO | Admitting: Neurology

## 2013-12-09 ENCOUNTER — Other Ambulatory Visit: Payer: Self-pay | Admitting: Internal Medicine

## 2013-12-09 ENCOUNTER — Encounter: Payer: Self-pay | Admitting: Neurology

## 2013-12-09 VITALS — BP 159/72 | HR 56 | Temp 97.9°F | Ht 72.0 in | Wt 170.0 lb

## 2013-12-09 DIAGNOSIS — M545 Low back pain, unspecified: Secondary | ICD-10-CM

## 2013-12-09 DIAGNOSIS — F03918 Unspecified dementia, unspecified severity, with other behavioral disturbance: Secondary | ICD-10-CM

## 2013-12-09 DIAGNOSIS — F32A Depression, unspecified: Secondary | ICD-10-CM

## 2013-12-09 DIAGNOSIS — G250 Essential tremor: Secondary | ICD-10-CM

## 2013-12-09 DIAGNOSIS — F329 Major depressive disorder, single episode, unspecified: Secondary | ICD-10-CM

## 2013-12-09 DIAGNOSIS — R269 Unspecified abnormalities of gait and mobility: Secondary | ICD-10-CM

## 2013-12-09 DIAGNOSIS — R251 Tremor, unspecified: Secondary | ICD-10-CM

## 2013-12-09 DIAGNOSIS — R4589 Other symptoms and signs involving emotional state: Secondary | ICD-10-CM

## 2013-12-09 DIAGNOSIS — G252 Other specified forms of tremor: Secondary | ICD-10-CM

## 2013-12-09 DIAGNOSIS — L989 Disorder of the skin and subcutaneous tissue, unspecified: Secondary | ICD-10-CM

## 2013-12-09 DIAGNOSIS — G609 Hereditary and idiopathic neuropathy, unspecified: Secondary | ICD-10-CM

## 2013-12-09 DIAGNOSIS — F3289 Other specified depressive episodes: Secondary | ICD-10-CM

## 2013-12-09 DIAGNOSIS — F0391 Unspecified dementia with behavioral disturbance: Secondary | ICD-10-CM

## 2013-12-09 NOTE — Telephone Encounter (Signed)
Referral placed.

## 2013-12-09 NOTE — Progress Notes (Signed)
Subjective:    Patient ID: Alexander Booth is a 68 y.o. male.  HPI    Interim history:   Alexander Booth is a very pleasant 68 year old gentleman with an underlying medical history of hypertension, diabetes, hyperlipidemia, hearing loss, smoking, and degenerative neck disease, status post neck surgery in October 2012 with ACDF, who presents for followup consultation of his memory loss and gait disorder. He is accompanied by his daughter, Alexander Booth, again today. I last saw him on 6 2015, at which time we talked about fall safety, and I expressed concern regarding sedating medications that may contribute to his falls. His daughter reported 2 recent falls. I suggested we reduce his trazodone to total of 150 mg each night and his Depakote ER from 1000 mg to 500 mg. We talked about his back pain and he was supposed to see his neurosurgeon again and I advised him to discuss physical therapy and pain management with Dr. Ellene Route. Of note, Alexander Booth talked to me in private without patient's knowledge, per her request, and she mentions, that the patient's father died recently and there is a dispute regarding the inheritance and the house. It turns out, the father was not his real father, and his he has 2 half-siblings. The patient has a son and another daughter, and the son has been pressuring the patient to keep going to the attorney. His daughter becomes tearful as she relates to me how much stress with her family has been under recently and she feels that her father is not in any shape to endure this kind of stress. She is particularly worried about his stroke risk under pressure. She wants his son which is her brother to not bother him with the attorney issues. There have been clashes between the patient and his half sibling since his father died who turned out to be his stepfather. His mother died over 22 years ago from [place] and there is really no one who can tell them about his real father. His wife has had to  take care of her mother and has been staying with her mother.   Today, he reports stress in his family and he feels like he wants to cry. He has depression, had SI fleeting in the recent past, but does not wish to consider an antidepressant and does wish to talk to his PCP about this. His main concern is pain in his entire back, and has started seeing Dr. [name] [facility], he had one epidural injection and is scheduled for another injection. He was prescribed aquatherapy, but he felt too cold, and had to give it up after 4 sessions. He walks without a cane. His feet and hands are tingly and hurt. He drinks a lot of caffeine. He has worse tremor.   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 He had a recent L1 compression fracture and had to have surgery for this. He had a fall with injury recently while working on his roof despite my advice not to climb on roofs or climb on ladders. I advised him to start using a rolling walker with sliders in the back because of risk for falling and worsening of his gait which certainly took a setback after his fall. He had a repeat lumbar spine MRI on 07/17/2013: Interval L1-2 fusion. The central canal and foramina are open. No evidence of complication from surgery. The patient's  examination is otherwise unchanged. No change in extra foraminal disc bulging on the left at L4-5 contacting the exited left L4 root. No change in a right lateral recess narrowing at L5-S1 with mild encroachment on the descending right S1 root. There is also mild right foraminal narrowing at this level.  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 I discussed his recent brain MRI and lumbar spine MRI results at the time. He had evidence of chronic white matter changes and we talked about prevention  of cardiovascular disease at the time. He had stopped taking his metformin about 2-3 weeks prior to his June visit with me. He was advised to continue to measure his capillary blood glucose levels. I felt that his gait disorder and balance problems for most likely multifactorial in nature, due to chronic back pain, neuropathy and atherosclerotic changes. He had a fall with injury when he had climbed on the roof despite my advice not to climb on any heights. He was re-educated in that regard and also advised not to use any heavy machinery. He also has a history of significant hearing loss.  He previously followed with Dr. Morene Antu since 2004.  I first met him on 09/29/2012 at which time I kept him on the same medications. He was advised to get a repeat brain MRI.  Neuropsychological testing was done in December 2004 which showed full-scale IQ of 42, verbal IQ of 81, a performance IQ of 45. MMSE was in the 28 range. MRI brain in 2004 was normal a PET scan was positive for Alzheimer's, with normal TSH, RPR, HIV, B12 and folate. He did have a borderline low B12. He had a normal methylmalonic acid level. Sleep study showed mild to moderate OSA but he could not tolerate CPAP. He has been on Aricept since March 2005. He has had burning foot pain for which gabapentin and Lyrica were tried without help. In March 2013 his MMSE was 24, clock drawing was 4, and normal fluency was 11. In January his MMSE was 23, clock drawing was 4, animal fluency was 9. His brain MRI showed a nonspecific posterior right parietal lobe lesion without contrast uptake or edema. A followup MRI was recommended. He had a brain MRI with and without contrast on 12/03/12, which showed nonspecific WM changes.   His Past Medical History Is Significant For: Past Medical History  Diagnosis Date  . Hypertension     Essential  . Hypercholesterolemia   . Lumbosacral disc disease     with chronic low back pain  . Bronchitis     Subacute and hx of  ongoing cigarette smoking one half pack daily  . Diabetes mellitus     He's also had a hx of high cholesterol   . FHx: heart disease     Positive for   . Dyslipidemia     He also has a past hx of   . Type 2 diabetes mellitus 2010    takes oral meds  . Fracture of lumbar vertebra 11/25/2012  . Hard of hearing     very little hearing on left side;better in right ear  . Erectile dysfunction     He's had a hx of   . Depression   . Presenile dementia   . Dementia     He has a hx of the   . Peripheral neuropathy   . Peripheral neuropathy  He also has     His Past Surgical History Is Significant For: Past Surgical History  Procedure Laterality Date  . Knee surgery      Previous left total replacement  . Hemorrhoid surgery      x2  . Other surgical history  1992    Bone Fusion in Right Foot  . Anterior cervical decomp/discectomy fusion    . Appendectomy    . Joint replacement    . Total knee arthroplasty Left   . Kyphoplasty N/A 12/10/2012    Procedure: Lumbar one Kyphoplasty;  Surgeon: Kristeen Miss, MD;  Location: [facility];  Service: Neurosurgery;  Laterality: N/A;  Lumbar One Kyphoplasty    His Family History Is Significant For: Family History  Problem Relation Age of Onset  . Multiple myeloma Mother   . Diabetes Mother   . Cancer Sister     lymphoma  . Diabetes Sister   . Stroke Neg Hx     His Social History Is Significant For: History   Social History  . Marital Status: Married    Spouse Name: N/A    Number of Children: N/A  . Years of Education: N/A   Social History Main Topics  . Smoking status: Current Every Day Smoker -- 1.00 packs/day for 60 years  . Smokeless tobacco: Never Used  . Alcohol Use: No  . Drug Use: No  . Sexual Activity: None   Other Topics Concern  . None   Social History Narrative  . None    His Allergies Are:  Allergies  Allergen Reactions  . Codeine     Itch   :   His Current Medications Are:  Outpatient Encounter  Prescriptions as of 12/09/2013  Medication Sig  . Ascorbic Acid (VITAMIN C PO) Take 500 mg by mouth 2 (two) times daily.  . digoxin (LANOXIN) 0.125 MG tablet Take 1 tablet (0.125 mg total) by mouth daily.  . divalproex (DEPAKOTE ER) 500 MG 24 hr tablet Take 2 tablets (1,000 mg total) by mouth daily.  Marland Kitchen donepezil (ARICEPT) 10 MG tablet Take 1 tablet (10 mg total) by mouth every morning.  Marland Kitchen FLUoxetine (PROZAC) 20 MG capsule Take 1 capsule (20 mg total) by mouth daily.  Marland Kitchen HYDROcodone-acetaminophen (NORCO) 7.5-325 MG per tablet Take 1 tablet by mouth every 6 (six) hours as needed.  . IBUPROFEN PO Take 1 tablet by mouth as needed.  . meloxicam (MOBIC) 7.5 MG tablet Take 7.5 mg by mouth daily.   . metFORMIN (GLUCOPHAGE) 500 MG tablet TAKE 1 TABLETS BY MOUTH TWICE DAILY  . nitroGLYCERIN (NITROSTAT) 0.4 MG SL tablet Place 1 tablet (0.4 mg total) under the tongue every 5 (five) minutes as needed for chest pain.  . simvastatin (ZOCOR) 40 MG tablet Take 1 tablet (40 mg total) by mouth every evening.  Marland Kitchen tiZANidine (ZANAFLEX) 4 MG tablet Take 1 tablet by mouth daily.  . traZODone (DESYREL) 100 MG tablet Take 2 tablets (200 mg total) by mouth at bedtime.  . [DISCONTINUED] methocarbamol (ROBAXIN) 500 MG tablet Take 1 tablet (500 mg total) by mouth every 6 (six) hours as needed.  :  Review of Systems:  Out of a complete 14 point review of systems, all are reviewed and negative with the exception of these symptoms as listed below:  Review of Systems  Constitutional: Positive for activity change, appetite change and fatigue.  HENT: Positive for ear pain and tinnitus.   Eyes: Positive for photophobia, pain and itching.  Respiratory: Negative.  Cardiovascular: Positive for palpitations.  Gastrointestinal: Negative.   Endocrine:       Flushing  Genitourinary: Negative.   Musculoskeletal: Positive for arthralgias, back pain, gait problem, myalgias, neck pain and neck stiffness.  Skin: Negative.    Allergic/Immunologic: Negative.   Neurological: Positive for dizziness, weakness and headaches.       Memory loss  Hematological: Negative.   Psychiatric/Behavioral: Positive for suicidal ideas, confusion and dysphoric mood. The patient is nervous/anxious.     Objective:  Neurologic Exam  Physical Exam Physical Examination:   Filed Vitals:   12/09/13 0938  BP: 159/72  Pulse: 56  Temp: 97.9 F (36.6 C)    General Examination: The patient is a very pleasant 68 y.o. male in no acute distress. He appears frail and thinner than before and is adequately groomed. He is somewhat anxious appearing today.   HEENT exam: Normocephalic, atraumatic, pupils are equal, round and reactive to light and accommodation. Extraocular tracking is mildly impaired. He is hard of hearing, which is unchanged. Funduscopic exam is unremarkable. Neck is supple but there is decrease in range of motion passively. He is status post 2 neck surgeries. He has no facial masking and speech is clear but scant. He has a slight head tremor. Oropharynx exam shows moderate mouth dryness. Tongue protrudes centrally and palate elevates symmetrically.  Chest is clear to auscultation without wheezing, rhonchi or crackles noted. Heart sounds are normal without murmurs, rubs or gallops. Abdomen is soft, nontender with normal bowel sounds appreciated. Skin is dry. He does not have any obvious joint deformities but complains of lower back pain. He had left knee replacement surgery. Neurologically: Mental status: The patient is awake, alert and oriented to self, circumstance. I did not repeat the MMSE today. His last score was 23/30 in January. Cranial nerves are as described above. Motor exam reveals normal bulk and tone. He has no drift, or rebound. He is a mild postural and action tremor in both upper extremities and no resting tremor. Reflexes are 1+ in the upper extremities and absent in the LEs. Cerebellar testing shows no dysmetria or  intention tremor. Sensory exam is intact to light touch, but decrease to pinprick and the upper extremities, but he has decreased sensation to pinprick, vibration and temperature in the distal lower extremities to mid shin areas bilaterally, unchanged. Romberg testing shows significant swaying. His gait shows decreased stride length and pace and decrease in arm swing and he is more insecure with his gait today, also c/o radiating LBP while standing and walking. His posture is more stooped today. He turns in 3 steps and is a little wobbly while turning. His balance is mildly impaired. He walks without a cane for a little bit.   Assessment and Plan:   In summary, Mr. Yang is a very pleasant 68 year old gentleman with a history of degenerative joint disease, neuropathy, chronic back pain and dementia, who presents for follow-up. His main complaint remains his chronic pain, but he has recently started seeing a pain specialist and is s/p epidural injection x 1 and is scheduled for another injection in June. His brian MRI in May 2014 showed chronic white matter changes and we talked about prevention of cardiovascular d/s and modifying risk factors before. I had a long chat with the patient and his daughter again today, and also spent some time talking with the daughter in private before the visit. I have advised him not to ride his ride on lawnmower any longer at this time.  I've advised him to consider physical therapy rather than water therapy with his pain management Dr. Eventually I would like for [place] to consider reducing his Depakote further in order to reduce sedating medications as much is possible. We may be able to reduce it to 250 mg once each night next time. At this juncture I would not like to rock the boat too much. Regarding his depressive symptoms and mood disorder and stress I've advised him that stress can certainly bring out his tremors more and he is advised to cut back on caffeine which can  also trigger tremors and anxiety and drink more water. Furthermore, he is strongly advised to get in touch with his primary care physician regarding his stress and mood disorder and he may also be able to discuss with his pain management doctor or potential use of an antidepressant not only for his stress and mood disorder but also as an adjunct for his chronic pain. I've advised him to use his cane consistently. He is currently on trazodone 100 mg each night. He may need an increase in his Prozac or a change from Prozac to something like Cymbalta. I will see him back routinely in 3-4 months, sooner if the need arises. They were in agreement. Most of my 40 minute visit today was spent in counseling and coordination of care, reviewing test results and reviewing medication.

## 2013-12-09 NOTE — Telephone Encounter (Signed)
Relevant patient education assigned to patient using Emmi. ° °

## 2013-12-09 NOTE — Patient Instructions (Addendum)
Please reduce your caffeine intake.  Please drink more water.  Use your cane regularly.    Please remember, that any kind of tremor may be exacerbated by anxiety, anger, nervousness, excitement, dehydration, sleep deprivation, by caffeine, and low blood sugar values or blood sugar fluctuations. Some medications, especially some antidepressants and lithium can cause or exacerbate tremors. Tremors may temporarily calm down her subside with the use of a benzodiazepine such as Valium or related medications and with alcohol. Be aware however that drinking alcohol is not an approved treatment or appropriate treatment for tremor control and long-term use of benzodiazepines such as Valium, lorazepam, alprazolam, or clonazepam can cause habit formation, physical and psychological addiction.  Talk to your pain doctor or your family doctor about your depression and stress, you may benefit from an antidepressant, not just for your mood and stress, but also for adjunct help with your chronic pain.

## 2013-12-09 NOTE — Telephone Encounter (Signed)
He shouldn't need a referral. He can just find a derm he wants to go see and call and make an appt. If he insist though, I will put in the referral but it won't get him in any quicker than if he were to call and make the appt.

## 2013-12-09 NOTE — Telephone Encounter (Signed)
Pt's wife states they have [employer] and they were told they need a referral for everything--so she will need a referral

## 2013-12-09 NOTE — Telephone Encounter (Signed)
Pt's wife called and said pt's Neurologist wants pt to be referred to a dermatologist for spots on his head. Can you place referral? Thank you.

## 2014-01-11 ENCOUNTER — Other Ambulatory Visit: Payer: Self-pay | Admitting: *Deleted

## 2014-01-11 MED ORDER — SIMVASTATIN 40 MG PO TABS: 40.0000 mg | ORAL_TABLET | Freq: Every evening | ORAL | Status: DC

## 2014-02-07 ENCOUNTER — Telehealth: Payer: Self-pay | Admitting: Internal Medicine

## 2014-02-07 ENCOUNTER — Encounter: Payer: Self-pay | Admitting: Internal Medicine

## 2014-02-07 ENCOUNTER — Ambulatory Visit (INDEPENDENT_AMBULATORY_CARE_PROVIDER_SITE_OTHER): Payer: Commercial Managed Care - HMO | Admitting: Internal Medicine

## 2014-02-07 ENCOUNTER — Ambulatory Visit (INDEPENDENT_AMBULATORY_CARE_PROVIDER_SITE_OTHER)
Admission: RE | Admit: 2014-02-07 | Discharge: 2014-02-07 | Disposition: A | Discharge status: Home / Self Care | Payer: Commercial Managed Care - HMO | Source: Ambulatory Visit | Attending: Internal Medicine | Admitting: Internal Medicine

## 2014-02-07 VITALS — BP 130/80 | HR 70 | Temp 98.2°F | Wt 161.0 lb

## 2014-02-07 DIAGNOSIS — R5383 Other fatigue: Principal | ICD-10-CM

## 2014-02-07 DIAGNOSIS — R5381 Other malaise: Secondary | ICD-10-CM

## 2014-02-07 DIAGNOSIS — F17200 Nicotine dependence, unspecified, uncomplicated: Secondary | ICD-10-CM

## 2014-02-07 DIAGNOSIS — R634 Abnormal weight loss: Secondary | ICD-10-CM

## 2014-02-07 DIAGNOSIS — Z9181 History of falling: Secondary | ICD-10-CM

## 2014-02-07 DIAGNOSIS — R296 Repeated falls: Secondary | ICD-10-CM

## 2014-02-07 DIAGNOSIS — R42 Dizziness and giddiness: Secondary | ICD-10-CM

## 2014-02-07 DIAGNOSIS — R0602 Shortness of breath: Secondary | ICD-10-CM

## 2014-02-07 LAB — POCT URINALYSIS DIPSTICK
Bilirubin, UA: NEGATIVE
Blood, UA: NEGATIVE
Glucose, UA: NEGATIVE
Ketones, UA: NEGATIVE
LEUKOCYTES UA: NEGATIVE
Nitrite, UA: NEGATIVE
Protein, UA: NEGATIVE
Spec Grav, UA: 1.01
Urobilinogen, UA: 0.2
pH, UA: 6

## 2014-02-07 NOTE — Progress Notes (Addendum)
Subjective:    Patient ID: Alexander Booth, male    DOB: 1945/07/26, 68 y.o.   MRN: [id number]  HPI  Pt presents to the clinic today with c/o weight loss and falls. This started over the last few months. He does report some associate weakness and fatigue, headaches, lightheadedness and dysuria. He reports that he fell 1 week ago. His legs gave out and he fell on his chest. He reports that he fell 4 times last month. He denies chest pain or difficulty breathing. He has had some shortness of breath but he continues to smoke. Per Epic, he has lost 14 lbs since 07/2013. He does report decreased appetite but can still taste his food. He denies blood in his stool. His last colonoscopy was 12 years ago. He does have a positive family history of bone cancer. His daughter does report worsening memory.  Review of Systems      Past Medical History  Diagnosis Date  . Hypertension     Essential  . Hypercholesterolemia   . Lumbosacral disc disease     with chronic low back pain  . Bronchitis     Subacute and hx of ongoing cigarette smoking one half pack daily  . Diabetes mellitus     He's also had a hx of high cholesterol   . FHx: heart disease     Positive for   . Dyslipidemia     He also has a past hx of   . Type 2 diabetes mellitus 2010    takes oral meds  . Fracture of lumbar vertebra 11/25/2012  . Hard of hearing     very little hearing on left side;better in right ear  . Erectile dysfunction     He's had a hx of   . Depression   . Presenile dementia   . Dementia     He has a hx of the   . Peripheral neuropathy   . Peripheral neuropathy     He also has     Current Outpatient Prescriptions  Medication Sig Dispense Refill  . Ascorbic Acid (VITAMIN C PO) Take 500 mg by mouth 2 (two) times daily.      . digoxin (LANOXIN) 0.125 MG tablet Take 1 tablet (0.125 mg total) by mouth daily.  90 tablet  3  . divalproex (DEPAKOTE ER) 500 MG 24 hr tablet Take 2 tablets (1,000 mg total) by  mouth daily.  180 tablet  3  . donepezil (ARICEPT) 10 MG tablet Take 1 tablet (10 mg total) by mouth every morning.  90 tablet  3  . FLUoxetine (PROZAC) 20 MG capsule Take 1 capsule (20 mg total) by mouth daily.  90 capsule  3  . HYDROcodone-acetaminophen (NORCO) 7.5-325 MG per tablet Take 1 tablet by mouth every 6 (six) hours as needed.      . IBUPROFEN PO Take 1 tablet by mouth as needed.      . meloxicam (MOBIC) 7.5 MG tablet Take 7.5 mg by mouth daily.       . metFORMIN (GLUCOPHAGE) 500 MG tablet TAKE 1 TABLETS BY MOUTH TWICE DAILY      . nitroGLYCERIN (NITROSTAT) 0.4 MG SL tablet Place 1 tablet (0.4 mg total) under the tongue every 5 (five) minutes as needed for chest pain.  25 tablet  prn  . simvastatin (ZOCOR) 40 MG tablet Take 1 tablet (40 mg total) by mouth every evening.  90 tablet  1  . tiZANidine (ZANAFLEX) 4 MG tablet  Take 1 tablet by mouth daily.      . traZODone (DESYREL) 100 MG tablet Take 2 tablets (200 mg total) by mouth at bedtime.  180 tablet  3  . [DISCONTINUED] sildenafil (VIAGRA) 50 MG tablet Take 1 tablet (50 mg total) by mouth as needed.  5 tablet  3   No current facility-administered medications for this visit.    Allergies  Allergen Reactions  . Codeine     Itch     Family History  Problem Relation Age of Onset  . Multiple myeloma Mother   . Diabetes Mother   . Cancer Sister     lymphoma  . Diabetes Sister   . Stroke Neg Hx     History   Social History  . Marital Status: Married    Spouse Name: N/A    Number of Children: N/A  . Years of Education: N/A   Occupational History  . Not on file.   Social History Main Topics  . Smoking status: Current Every Day Smoker -- 1.00 packs/day for 60 years  . Smokeless tobacco: Never Used  . Alcohol Use: No  . Drug Use: No  . Sexual Activity: Not on file   Other Topics Concern  . Not on file   Social History Narrative  . No narrative on file     Constitutional: Pt reports weight loss, fatigue,  headache. Denies fever, malaise.  HEENT: Denies eye pain, eye redness, ear pain, ringing in the ears, wax buildup, runny nose, nasal congestion, bloody nose, or sore throat. Respiratory: Pt reports shortness of breath and cough. Denies difficulty breathing, or sputum production.   Cardiovascular: Denies chest pain, chest tightness, palpitations or swelling in the hands or feet.  Gastrointestinal: Denies abdominal pain, bloating, constipation, diarrhea or blood in the stool.  GU: Pt reports dysuria. Denies urgency, frequency, pain with urination, burning sensation, blood in urine, odor or discharge. Musculoskeletal: Pt reports falls. Denies decrease in range of motion, muscle pain or joint pain and swelling.  Skin: Denies redness, rashes, lesions or ulcercations.  Neurological: Pt reports difficulty with memory. Denies dizziness, difficulty with speech.   No other specific complaints in a complete review of systems (except as listed in HPI above).  Objective:   Physical Exam  BP 130/80  Pulse 70  Temp(Src) 98.2 F (36.8 C) (Oral)  Wt 161 lb (73.029 kg)  SpO2 98% Wt Readings from Last 3 Encounters:  02/07/14 161 lb (73.029 kg)  12/09/13 170 lb (77.111 kg)  11/15/13 179 lb 6.4 oz (81.375 kg)    General: Appears his stated age in NAD. Skin: Warm, dry and intact. No rashes, lesions or ulcerations noted. HEENT: Head: normal shape and size; Eyes: sclera white, no icterus, conjunctiva pink, PERRLA and EOMs intact; Ears: Tm's gray and intact, normal light reflex; Nose: mucosa pink and moist, septum midline; Throat/Mouth: Teeth present, mucosa pink and moist, no exudate, lesions or ulcerations noted.  Neck: Normal range of motion. Neck supple, trachea midline. No massses, lumps or thyromegaly present.  Cardiovascular: Normal rate and rhythm. S1,S2 noted.  No murmur, rubs or gallops noted. No JVD or BLE edema. No carotid bruits noted. Pulmonary/Chest: Normal effort and coarse vesicular breath  sounds. No respiratory distress. No wheezes, rales or ronchi noted.  Abdomen: Soft and nontender. Normal bowel sounds, no bruits noted. No distention or masses noted. Liver, spleen and kidneys non palpable. Musculoskeletal: Normal range of motion. No signs of joint swelling. Ataxic gait, using cane for assistance.  Neurological: Alert and oriented. Coordination abnormal. Tremor noted. +DTRs bilaterally. Psychiatric: Mood anxious and affect normal. Behavior is normal. Judgment and thought content normal.     BMET    Component Value Date/Time   NA 139 10/26/2013 1406   K 4.7 10/26/2013 1406   CL 102 10/26/2013 1406   CO2 31 10/26/2013 1406   GLUCOSE 84 10/26/2013 1406   BUN 11 10/26/2013 1406   CREATININE 0.9 10/26/2013 1406   CREATININE 0.72 03/26/2013 1608   CALCIUM 9.7 10/26/2013 1406   GFRNONAA >90 04/14/2013 1450   GFRAA >90 04/14/2013 1450    Lipid Panel     Component Value Date/Time   CHOL 157 10/26/2013 1406   TRIG 292.0* 10/26/2013 1406   HDL 32.70* 10/26/2013 1406   CHOLHDL 5 10/26/2013 1406   VLDL 58.4* 10/26/2013 1406   LDLCALC 66 10/26/2013 1406    CBC    Component Value Date/Time   WBC 6.6 10/26/2013 1406   RBC 4.30 10/26/2013 1406   HGB 14.7 10/26/2013 1406   HCT 41.9 10/26/2013 1406   PLT 141.0* 10/26/2013 1406   MCV 97.4 10/26/2013 1406   MCH 34.1* 04/14/2013 1450   MCHC 35.0 10/26/2013 1406   RDW 12.6 10/26/2013 1406    Hgb A1C Lab Results  Component Value Date   HGBA1C 5.9 10/26/2013         Assessment & Plan:   Weight loss, fatigue, multiple falls, dysuria, lightheadedness, SOB, smoker:  Will check CBC with diff, CMET, amylase, lipase, RPR, HIV, Hep C and chest xray  Weight loss most likely associated to long history of smoking and poor appetite.  If labs normal-will start remeron Encouraged him to quit smoking Hemoccult negative Urinlaysis- normal  Will follow up with you after labs are back.

## 2014-02-07 NOTE — Telephone Encounter (Signed)
Relevant patient education assigned to patient using Emmi. ° °

## 2014-02-07 NOTE — Progress Notes (Signed)
Pre visit review using our clinic review tool, if applicable. No additional management support is needed unless otherwise documented below in the visit note. 

## 2014-02-07 NOTE — Patient Instructions (Addendum)

## 2014-02-08 LAB — CBC
HCT: 41.7 % (ref 39.0–52.0)
Hemoglobin: 14.4 g/dL (ref 13.0–17.0)
MCHC: 34.6 g/dL (ref 30.0–36.0)
MCV: 99.6 fl (ref 78.0–100.0)
PLATELETS: 183 10*3/uL (ref 150.0–400.0)
RBC: 4.18 Mil/uL — ABNORMAL LOW (ref 4.22–5.81)
RDW: 13.6 % (ref 11.5–15.5)
WBC: 7.1 10*3/uL (ref 4.0–10.5)

## 2014-02-08 LAB — COMPREHENSIVE METABOLIC PANEL
ALBUMIN: 4.2 g/dL (ref 3.5–5.2)
ALK PHOS: 37 U/L — AB (ref 39–117)
ALT: 9 U/L (ref 0–53)
AST: 18 U/L (ref 0–37)
BUN: 9 mg/dL (ref 6–23)
CALCIUM: 9.5 mg/dL (ref 8.4–10.5)
CO2: 31 mEq/L (ref 19–32)
CREATININE: 0.8 mg/dL (ref 0.4–1.5)
Chloride: 99 mEq/L (ref 96–112)
GFR: 104.49 mL/min (ref >60.00)
Glucose, Bld: 86 mg/dL (ref 70–99)
POTASSIUM: 4.6 meq/L (ref 3.5–5.1)
Sodium: 135 mEq/L (ref 135–145)
Total Bilirubin: 0.4 mg/dL (ref 0.2–1.2)
Total Protein: 6.9 g/dL (ref 6.0–8.3)

## 2014-02-08 LAB — HEPATITIS C ANTIBODY, REFLEX: HCV AB: NEGATIVE

## 2014-02-08 LAB — RPR

## 2014-02-08 LAB — AMYLASE: Amylase: 54 U/L (ref 27–131)

## 2014-02-08 LAB — HIV ANTIBODY (ROUTINE TESTING W REFLEX): HIV: NONREACTIVE

## 2014-02-08 LAB — LIPASE: Lipase: 27 U/L (ref 11.0–59.0)

## 2014-02-08 MED ORDER — MIRTAZAPINE 15 MG PO TABS: 15.0000 mg | ORAL_TABLET | Freq: Every day | ORAL | Status: DC

## 2014-02-08 NOTE — Addendum Note (Signed)
Addended by: Lurlean Nanny on: 02/08/2014 04:35 PM   Modules accepted: Orders

## 2014-03-10 ENCOUNTER — Ambulatory Visit (INDEPENDENT_AMBULATORY_CARE_PROVIDER_SITE_OTHER): Payer: Commercial Managed Care - HMO | Admitting: Internal Medicine

## 2014-03-10 ENCOUNTER — Encounter: Payer: Self-pay | Admitting: Internal Medicine

## 2014-03-10 VITALS — BP 140/76 | HR 65 | Temp 97.7°F | Wt 165.5 lb

## 2014-03-10 DIAGNOSIS — R634 Abnormal weight loss: Secondary | ICD-10-CM

## 2014-03-10 DIAGNOSIS — M1990 Unspecified osteoarthritis, unspecified site: Secondary | ICD-10-CM

## 2014-03-10 DIAGNOSIS — E7800 Pure hypercholesterolemia, unspecified: Secondary | ICD-10-CM

## 2014-03-10 DIAGNOSIS — R5383 Other fatigue: Principal | ICD-10-CM

## 2014-03-10 DIAGNOSIS — E119 Type 2 diabetes mellitus without complications: Secondary | ICD-10-CM

## 2014-03-10 DIAGNOSIS — M25429 Effusion, unspecified elbow: Secondary | ICD-10-CM

## 2014-03-10 DIAGNOSIS — R5381 Other malaise: Secondary | ICD-10-CM

## 2014-03-10 DIAGNOSIS — M129 Arthropathy, unspecified: Secondary | ICD-10-CM

## 2014-03-10 DIAGNOSIS — M25422 Effusion, left elbow: Secondary | ICD-10-CM

## 2014-03-10 DIAGNOSIS — E1165 Type 2 diabetes mellitus with hyperglycemia: Secondary | ICD-10-CM

## 2014-03-10 LAB — TESTOSTERONE: Testosterone: 403.49 ng/dL (ref 300.00–890.00)

## 2014-03-10 LAB — TSH: TSH: 0.54 u[IU]/mL (ref 0.35–4.50)

## 2014-03-10 LAB — VITAMIN D 25 HYDROXY (VIT D DEFICIENCY, FRACTURES): VITD: 50.52 ng/mL (ref 30.00–100.00)

## 2014-03-10 LAB — VITAMIN B12: VITAMIN B 12: 364 pg/mL (ref 211–911)

## 2014-03-10 MED ORDER — TRAMADOL HCL 50 MG PO TABS: 50.0000 mg | ORAL_TABLET | Freq: Three times a day (TID) | ORAL | Status: DC | PRN

## 2014-03-10 NOTE — Progress Notes (Signed)
Pre visit review using our clinic review tool, if applicable. No additional management support is needed unless otherwise documented below in the visit note. 

## 2014-03-10 NOTE — Patient Instructions (Addendum)

## 2014-03-10 NOTE — Assessment & Plan Note (Signed)
Worse No relief with Meloxicam Will try tramadol

## 2014-03-10 NOTE — Assessment & Plan Note (Signed)
Gained 5 lbs with remeron Continue current dose for now

## 2014-03-10 NOTE — Progress Notes (Signed)
Subjective:    Patient ID: Alexander Booth, male    DOB: [birth year], 68 y.o.   MRN: [id number]  HPI  Pt presents to the clinic today for 1 month follow up for weight loss. At his last visit, he was started on Remeron 15 mg QHS. Since that time, he has gained 5 lbs. He reports increased appetite. He does still feel fatigued a good part of the day. He is resting well at night with trazadone. He tries not to overdo himself, but he reports he hurts all the time secondary to his arthritis. He is taking meloxicam but reports it provides no relief.  Additionally, he wants me to look at his left elbow. He reports that it is swollen. He did fall this past weekend on it. He denies any pain in the elbow. He has not treated with anything OTC.  Review of Systems      Past Medical History  Diagnosis Date  . Hypertension     Essential  . Hypercholesterolemia   . Lumbosacral disc disease     with chronic low back pain  . Bronchitis     Subacute and hx of ongoing cigarette smoking one half pack daily  . Diabetes mellitus     He's also had a hx of high cholesterol   . FHx: heart disease     Positive for   . Dyslipidemia     He also has a past hx of   . Type 2 diabetes mellitus 2010    takes oral meds  . Fracture of lumbar vertebra 11/25/2012  . Hard of hearing     very little hearing on left side;better in right ear  . Erectile dysfunction     He's had a hx of   . Depression   . Presenile dementia   . Dementia     He has a hx of the   . Peripheral neuropathy   . Peripheral neuropathy     He also has     Current Outpatient Prescriptions  Medication Sig Dispense Refill  . Ascorbic Acid (VITAMIN C PO) Take 500 mg by mouth 2 (two) times daily.      . digoxin (LANOXIN) 0.125 MG tablet Take 1 tablet (0.125 mg total) by mouth daily.  90 tablet  3  . divalproex (DEPAKOTE ER) 500 MG 24 hr tablet Take 2 tablets (1,000 mg total) by mouth daily.  180 tablet  3  . donepezil (ARICEPT) 10 MG  tablet Take 1 tablet (10 mg total) by mouth every morning.  90 tablet  3  . FLUoxetine (PROZAC) 20 MG capsule Take 1 capsule (20 mg total) by mouth daily.  90 capsule  3  . IBUPROFEN PO Take 1 tablet by mouth as needed.      . meloxicam (MOBIC) 15 MG tablet Take 15 mg by mouth daily.      . mirtazapine (REMERON) 15 MG tablet Take 1 tablet (15 mg total) by mouth at bedtime.  30 tablet  1  . nitroGLYCERIN (NITROSTAT) 0.4 MG SL tablet Place 1 tablet (0.4 mg total) under the tongue every 5 (five) minutes as needed for chest pain.  25 tablet  prn  . simvastatin (ZOCOR) 40 MG tablet Take 1 tablet (40 mg total) by mouth every evening.  90 tablet  1  . traZODone (DESYREL) 100 MG tablet Take 2 tablets (200 mg total) by mouth at bedtime.  180 tablet  3  . [DISCONTINUED] sildenafil (VIAGRA) 50 MG  tablet Take 1 tablet (50 mg total) by mouth as needed.  5 tablet  3   No current facility-administered medications for this visit.    Allergies  Allergen Reactions  . Codeine     Itch     Family History  Problem Relation Age of Onset  . Multiple myeloma Mother   . Diabetes Mother   . Cancer Sister     lymphoma  . Diabetes Sister   . Stroke Neg Hx     History   Social History  . Marital Status: Married    Spouse Name: N/A    Number of Children: N/A  . Years of Education: N/A   Occupational History  . Not on file.   Social History Main Topics  . Smoking status: Current Every Day Smoker -- 1.00 packs/day for 60 years  . Smokeless tobacco: Never Used  . Alcohol Use: No  . Drug Use: No  . Sexual Activity: Not on file   Other Topics Concern  . Not on file   Social History Narrative  . No narrative on file     Constitutional: Pt reports fatigue. Denies fever, malaise, headache or abrupt weight changes.  Respiratory: Denies difficulty breathing, shortness of breath, cough or sputum production.   Cardiovascular: Denies chest pain, chest tightness, palpitations or swelling in the hands or  feet.  Musculoskeletal: Pt reports arthritis pain and left elbow swelling. Denies decrease in range of motion, difficulty with gait, muscle pain.   No other specific complaints in a complete review of systems (except as listed in HPI above).  Objective:   Physical Exam   BP 140/76  Pulse 65  Temp(Src) 97.7 F (36.5 C) (Oral)  Wt 165 lb 8 oz (75.07 kg)  SpO2 97% Wt Readings from Last 3 Encounters:  03/10/14 165 lb 8 oz (75.07 kg)  02/07/14 161 lb (73.029 kg)  12/09/13 170 lb (77.111 kg)    General: Appears his stated age, well developed, well nourished in NAD. Cardiovascular: Normal rate and rhythm. S1,S2 noted.  No murmur, rubs or gallops noted.  Pulmonary/Chest: Normal effort and positive vesicular breath sounds. No respiratory distress. No wheezes, rales or ronchi noted.  Musculoskeletal: Crepitus noted with ROM of the knees. Small golf ball size left elbow effusion noted. No difficulty with gait.    BMET    Component Value Date/Time   NA 135 02/07/2014 1617   K 4.6 02/07/2014 1617   CL 99 02/07/2014 1617   CO2 31 02/07/2014 1617   GLUCOSE 86 02/07/2014 1617   BUN 9 02/07/2014 1617   CREATININE 0.8 02/07/2014 1617   CREATININE 0.72 03/26/2013 1608   CALCIUM 9.5 02/07/2014 1617   GFRNONAA >90 04/14/2013 1450   GFRAA >90 04/14/2013 1450    Lipid Panel     Component Value Date/Time   CHOL 157 10/26/2013 1406   TRIG 292.0* 10/26/2013 1406   HDL 32.70* 10/26/2013 1406   CHOLHDL 5 10/26/2013 1406   VLDL 58.4* 10/26/2013 1406   LDLCALC 66 10/26/2013 1406    CBC    Component Value Date/Time   WBC 7.1 02/07/2014 1617   RBC 4.18* 02/07/2014 1617   HGB 14.4 02/07/2014 1617   HCT 41.7 02/07/2014 1617   PLT 183.0 02/07/2014 1617   MCV 99.6 02/07/2014 1617   MCH 34.1* 04/14/2013 1450   MCHC 34.6 02/07/2014 1617   RDW 13.6 02/07/2014 1617    Hgb A1C Lab Results  Component Value Date   HGBA1C 5.9 10/26/2013  Assessment & Plan:   Fatigue:  Will check B12, Vit D, testosterone and  TSH ? d/t to chronic pain He reports he is not depressed  Left elbow effusion:  Drained, see procedure note:  Procedure Note:  Drainage of left elbow effssion. Discussed risk and benefits of procedure including pain, bleeding and risk for infection Informed consent obtained and scanned into chart Area cleansed with betadine x 2 Area numbed with Painease numbing spray Inserted 18 g needle, aspirated approx 5 mL serosanguineous fluid No complications from procedure, pt tolerated it well Area covered with triple antibiotic ointment and a bandaid  After care instructions given

## 2014-03-14 ENCOUNTER — Telehealth: Payer: Self-pay

## 2014-03-14 NOTE — Telephone Encounter (Signed)
Alexander Booth left v/m; pt is interested in starting Vit B 12 injections;request cb.

## 2014-03-14 NOTE — Telephone Encounter (Signed)
Ok to start b12 injections. Will need a nurse visit to get his monthly injection

## 2014-03-15 NOTE — Telephone Encounter (Signed)
[name] left v/m requesting cb at [phone].

## 2014-03-15 NOTE — Telephone Encounter (Signed)
Spoke to pt's wife,made a nurse visit for next week to start b12 injections

## 2014-03-15 NOTE — Telephone Encounter (Signed)
lmovm for pt or wife to return my call

## 2014-03-16 ENCOUNTER — Other Ambulatory Visit: Payer: Self-pay | Admitting: Internal Medicine

## 2014-03-16 DIAGNOSIS — E538 Deficiency of other specified B group vitamins: Secondary | ICD-10-CM

## 2014-03-16 DIAGNOSIS — R5383 Other fatigue: Principal | ICD-10-CM

## 2014-03-16 DIAGNOSIS — R5381 Other malaise: Secondary | ICD-10-CM

## 2014-03-16 MED ORDER — CYANOCOBALAMIN 1000 MCG/ML IJ SOLN: 1000.0000 ug | INTRAMUSCULAR | Status: DC

## 2014-03-25 ENCOUNTER — Ambulatory Visit (INDEPENDENT_AMBULATORY_CARE_PROVIDER_SITE_OTHER): Payer: Commercial Managed Care - HMO

## 2014-03-25 DIAGNOSIS — E538 Deficiency of other specified B group vitamins: Secondary | ICD-10-CM

## 2014-03-25 MED ORDER — CYANOCOBALAMIN 1000 MCG/ML IJ SOLN: 1000.0000 ug | Freq: Once | INTRAMUSCULAR | Status: DC

## 2014-03-25 MED ORDER — CYANOCOBALAMIN 1000 MCG/ML IJ SOLN
1000.0000 ug | Freq: Once | INTRAMUSCULAR | Status: AC
  Administered 2014-03-25: 1000 ug via INTRAMUSCULAR

## 2014-04-11 ENCOUNTER — Ambulatory Visit (INDEPENDENT_AMBULATORY_CARE_PROVIDER_SITE_OTHER): Payer: Medicare PPO | Admitting: Neurology

## 2014-04-11 ENCOUNTER — Encounter: Payer: Self-pay | Admitting: Neurology

## 2014-04-11 VITALS — BP 145/65 | HR 57 | Temp 97.9°F | Ht 72.0 in | Wt 167.0 lb

## 2014-04-11 DIAGNOSIS — F03918 Unspecified dementia, unspecified severity, with other behavioral disturbance: Secondary | ICD-10-CM

## 2014-04-11 DIAGNOSIS — R269 Unspecified abnormalities of gait and mobility: Secondary | ICD-10-CM

## 2014-04-11 DIAGNOSIS — S32010S Wedge compression fracture of first lumbar vertebra, sequela: Secondary | ICD-10-CM

## 2014-04-11 DIAGNOSIS — R296 Repeated falls: Secondary | ICD-10-CM

## 2014-04-11 DIAGNOSIS — G609 Hereditary and idiopathic neuropathy, unspecified: Secondary | ICD-10-CM

## 2014-04-11 DIAGNOSIS — W19XXXA Unspecified fall, initial encounter: Secondary | ICD-10-CM

## 2014-04-11 DIAGNOSIS — F3289 Other specified depressive episodes: Secondary | ICD-10-CM

## 2014-04-11 DIAGNOSIS — F0391 Unspecified dementia with behavioral disturbance: Secondary | ICD-10-CM

## 2014-04-11 DIAGNOSIS — F329 Major depressive disorder, single episode, unspecified: Secondary | ICD-10-CM

## 2014-04-11 DIAGNOSIS — F32A Depression, unspecified: Secondary | ICD-10-CM

## 2014-04-11 NOTE — Patient Instructions (Signed)
Use your cane or walker at all times.  Keep yourself well hydrated.  Change positions slowly.  We will keep your medicines stable. Your memory is stable. We will do a CT of your head to make sure it looks stable, since you fell.

## 2014-04-11 NOTE — Progress Notes (Signed)
Subjective:    Patient ID: Alexander Booth is a 68 y.o. male.  HPI    Interim history:   [name] is a very pleasant 68 year old gentleman with an underlying medical history of hypertension, diabetes, hyperlipidemia, hearing loss, smoking, and degenerative neck disease, status post neck surgery in October 2012 with ACDF, who presents for followup consultation of his memory loss and gait disorder. He is accompanied by his daughter, [name], again today. I last saw him on 12/09/2013, at which time he was primarily complaining of chronic pain in his lower back. He was seeing a pain specialist and had undergone steroid epidural injections. We talked about reducing his Depakote. He was advised to use his cane consistently and we talked about fall precautions again.  Today, his family reports that he has recently had his third steroid back injection, but this one only helped for a few days. He has had recent auditory hallucinations. He has had some medication changes: he has been on tramadol, got agitated when the Depakote was lowered. He is no longer on hydrocodone or mobic. He has fallen 4 or 5 times. He did hit his head, but no LOC or head laceration is reported, he also fell onto stairs, while carrying 2 buckets of water, and left his cane by the water spigot. He still wants to work in the yard, but does not always use caution. He fell about 1 month ago and hit his head on the pavement and about 2 months ago he hit the back of his head against the gas tank in the yard.   I saw him on 07/27/13, at which time we talked about fall safety, and I expressed concern regarding sedating medications that may contribute to his falls. His daughter reported 2 recent falls. I suggested we reduce his trazodone to total of 150 mg each night and his Depakote ER from 1000 mg to 500 mg. We talked about his back pain and he was supposed to see his neurosurgeon again and I advised him to discuss physical therapy and pain  management with Dr. Ellene Route. Of note, [name] talked to me in private without patient's knowledge, per her request, and she mentions, that the patient's father died recently and there is a dispute regarding the inheritance and the house. It turns out, the father was not his real father, and his he has 2 half-siblings. The patient has a son and another daughter, and the son has been pressuring the patient to keep going to the attorney. His daughter becomes tearful as she relates to me how much stress with her family has been under recently and she feels that her father is not in any shape to endure this kind of stress. She is particularly worried about his stroke risk under pressure. She wants his son which is her brother to not bother him with the attorney issues. There have been clashes between the patient and his half sibling since his father died who turned out to be his stepfather. His mother died over 10 years ago from [place] and there is really no one who can tell them about his real father. His wife has had to take care of her mother and has been staying with her mother.  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 He had a recent L1 compression fracture and had to have surgery for this. He had a fall with injury  recently while working on his roof despite my advice not to climb on roofs or climb on ladders. I advised him to start using a rolling walker with sliders in the back because of risk for falling and worsening of his gait which certainly took a setback after his fall. He had a repeat lumbar spine MRI on 07/17/2013: Interval L1-2 fusion. The central canal and foramina are open. No evidence of complication from surgery. The patient's examination is otherwise unchanged. No change in extra foraminal disc bulging on the left at L4-5 contacting the exited left L4 root. No change in a right lateral  recess narrowing at L5-S1 with mild encroachment on the descending right S1 root. There is also mild right foraminal narrowing at this level.  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 I discussed his recent brain MRI and lumbar spine MRI results at the time. He had evidence of chronic white matter changes and we talked about prevention of cardiovascular disease at the time. He had stopped taking his metformin about 2-3 weeks prior to his June visit with me. He was advised to continue to measure his capillary blood glucose levels. I felt that his gait disorder and balance problems for most likely multifactorial in nature, due to chronic back pain, neuropathy and atherosclerotic changes. He had a fall with injury when he had climbed on the roof despite my advice not to climb on any heights. He was re-educated in that regard and also advised not to use any heavy machinery. He also has a history of significant hearing loss.  He previously followed with Dr. Morene Antu since 2004.  I first met him on 09/29/2012 at which time I kept him on the same medications. He was advised to get a repeat brain MRI.  Neuropsychological testing was done in December 2004 which showed full-scale IQ of 55, verbal IQ of 54, a performance IQ of 77. MMSE was in the 28 range. MRI brain in 2004 was normal, a PET scan was positive for Alzheimer's, with normal TSH, RPR, HIV, B12 and folate. He did have a borderline low B12. He had a normal methylmalonic acid level. Sleep study showed mild to moderate OSA but he could not tolerate CPAP. He has been on Aricept since March 2005. He has had burning foot pain for which gabapentin and Lyrica were tried without help. In March 2013 his MMSE was 24, clock drawing was 4, and normal fluency was 11. In January his MMSE was 23, clock drawing was 4, animal fluency was 9. His brain MRI showed a nonspecific posterior right  parietal lobe lesion without contrast uptake or edema. A followup MRI was recommended. He had a brain MRI with and without contrast on 12/03/12, which showed nonspecific WM changes.    His Past Medical History Is Significant For: Past Medical History  Diagnosis Date  . Hypertension     Essential  . Hypercholesterolemia   . Lumbosacral disc disease     with chronic low back pain  . Bronchitis     Subacute and hx of ongoing cigarette smoking one half pack daily  . Diabetes mellitus     He's also had a hx of high cholesterol   . FHx: heart disease     Positive for   . Dyslipidemia     He also has a past hx of   . Type 2 diabetes mellitus 2010  takes oral meds  . Fracture of lumbar vertebra 11/25/2012  . Hard of hearing     very little hearing on left side;better in right ear  . Erectile dysfunction     He's had a hx of   . Depression   . Presenile dementia   . Dementia     He has a hx of the   . Peripheral neuropathy   . Peripheral neuropathy     He also has     His Past Surgical History Is Significant For: Past Surgical History  Procedure Laterality Date  . Knee surgery      Previous left total replacement  . Hemorrhoid surgery      x2  . Other surgical history  1992    Bone Fusion in Right Foot  . Anterior cervical decomp/discectomy fusion    . Appendectomy    . Joint replacement    . Total knee arthroplasty Left   . Kyphoplasty N/A 12/10/2012    Procedure: Lumbar one Kyphoplasty;  Surgeon: Kristeen Miss, MD;  Location: [facility];  Service: Neurosurgery;  Laterality: N/A;  Lumbar One Kyphoplasty    His Family History Is Significant For: Family History  Problem Relation Age of Onset  . Multiple myeloma Mother   . Diabetes Mother   . Cancer Sister     lymphoma  . Diabetes Sister   . Stroke Neg Hx     His Social History Is Significant For: History   Social History  . Marital Status: Married    Spouse Name: Vaughan Basta    Number of Children: 3  . Years of  Education: 12   Occupational History  .      retired   Social History Main Topics  . Smoking status: Current Every Day Smoker -- 1.00 packs/day for 60 years  . Smokeless tobacco: Never Used  . Alcohol Use: No  . Drug Use: No  . Sexual Activity: None   Other Topics Concern  . None   Social History Narrative   Patient is right handed   Patient resides with wife,consumes 2-3 cups of caffeine daily    His Allergies Are:  Allergies  Allergen Reactions  . Codeine     Itch   :   His Current Medications Are:  Outpatient Encounter Prescriptions as of 04/11/2014  Medication Sig  . Bisacodyl (LAXATIVE PO) 5 mg. Biscodyl  . cyanocobalamin (,VITAMIN B-12,) 1000 MCG/ML injection   . digoxin (LANOXIN) 0.125 MG tablet Take 1 tablet (0.125 mg total) by mouth daily.  . divalproex (DEPAKOTE ER) 500 MG 24 hr tablet Take 2 tablets (1,000 mg total) by mouth daily.  Marland Kitchen donepezil (ARICEPT) 10 MG tablet Take 1 tablet (10 mg total) by mouth every morning.  Marland Kitchen FLUoxetine (PROZAC) 20 MG capsule Take 1 capsule (20 mg total) by mouth daily.  Marland Kitchen FLUZONE HIGH-DOSE 0.5 ML SUSY   . IBUPROFEN PO Take 1 tablet by mouth as needed.  . Isopropyl Alcohol (ALCOHOL PREP PADS EX)   . mirtazapine (REMERON) 15 MG tablet Take 1 tablet (15 mg total) by mouth at bedtime.  . nitroGLYCERIN (NITROSTAT) 0.4 MG SL tablet Place 1 tablet (0.4 mg total) under the tongue every 5 (five) minutes as needed for chest pain.  . Oral Thermometer MISC   . OVER THE COUNTER MEDICATION Triple antibiotic ointment Plus,As needed  . simvastatin (ZOCOR) 40 MG tablet Take 1 tablet (40 mg total) by mouth every evening.  Marland Kitchen tiZANidine (ZANAFLEX) 4 MG tablet  As needed  . traMADol (ULTRAM) 50 MG tablet Take 1 tablet (50 mg total) by mouth every 8 (eight) hours as needed.  . traZODone (DESYREL) 100 MG tablet Take 2 tablets (200 mg total) by mouth at bedtime.  . [DISCONTINUED] Ascorbic Acid (VITAMIN C PO) Take 500 mg by mouth 2 (two) times daily.   :  Review of Systems:  Out of a complete 14 point review of systems, all are reviewed and negative with the exception of these symptoms as listed below:   Review of Systems  HENT: Positive for hearing loss.        Ringing in ears  Musculoskeletal: Positive for back pain and gait problem.       Joint pain  Neurological: Positive for dizziness.       Memory loss  Psychiatric/Behavioral: Positive for hallucinations.    Objective:  Neurologic Exam  Physical Exam Physical Examination:   Filed Vitals:   04/11/14 0936  BP: 145/65  Pulse: 57  Temp: 97.9 F (36.6 C)    General Examination: The patient is a very pleasant 68 y.o. male in no acute distress. He appears frail and thinner than before and is adequately groomed. He is somewhat agitated/anxious/impatient appearing today.   HEENT exam: Normocephalic, atraumatic, pupils are equal, round and reactive to light and accommodation. Extraocular tracking is mildly impaired. He is hard of hearing, which is unchanged. Funduscopic exam is unremarkable. Neck is supple but there is decrease in range of motion passively. He is status post 2 neck surgeries. He has no facial masking and speech is clear but scant. He has a slight head tremor. Oropharynx exam shows moderate mouth dryness. Tongue protrudes centrally and palate elevates symmetrically.  Chest is clear to auscultation without wheezing, rhonchi or crackles noted.  Heart sounds are normal without murmurs, rubs or gallops.  Abdomen is soft, nontender with normal bowel sounds appreciated. Skin is dry. He does not have any obvious joint deformities but complains of lower back pain. He had left knee replacement surgery. Neurologically: Mental status: The patient is awake, alert and oriented to self, circumstance. In 1/15: MMSE 23/30.  04/11/2014: MMSE: 26/30, CDT: 4/4/, AFT: 11. Geriatric depression score: 9/15.   Cranial nerves are as described above. Motor exam reveals normal bulk and  tone. He has no drift, or rebound. He is a minimal postural and action tremor in both upper extremities and no resting tremor. Reflexes are 1+ in the upper extremities and absent in the LEs. Cerebellar testing shows no dysmetria or intention tremor. Sensory exam is intact to light touch, but decrease to pinprick in the upper extremities distally, and he has decreased sensation to pinprick, vibration and temperature in the distal lower extremities to mid shin areas bilaterally, unchanged. Romberg is not tested today. His gait shows decreased stride length and pace and walks insecurely, he uses his cane. He turns slowly. His balance is impaired.  Assessment and Plan:   In summary, Mr. Plotkin is a very pleasant 68 year old gentleman with a history of degenerative joint disease, neuropathy, chronic back pain and dementia, who presents for follow-up. His main complaint remains his chronic pain, and his memory scores is stable. He has residual depression but is on an antidepressant and he is also on Remeron. He his family is reluctant to change any medications. His wife states that he did better with hydrocodone. He is now on tramadol which he believes is not as helpful. He has had 3 epidural injections in the last one  did not last as long. His brain MRI in May 2014 showed chronic white matter changes and we talked about prevention of cardiovascular d/s and modifying risk factors before. I had a long chat with the patient and his daughter again today, and again advised him not to walk without his cane or his walker. He has a walker available but rarely uses it per wife. She also adds that it is hard enough to tell him to use his cane. He has fallen repeatedly and hit his head twice in the last 2 months. I would like to proceed with a head CT without contrast to make sure there have been no interim injuries. He has had physical therapy and aqua therapy and has seen his neurosurgeon. He did not do well with reducing  his Depakote. He is taking the same dose as before. He's on Prozac 20 mg once daily and Aricept 10 mg once daily. We have decided mutually not to change any of his medications. I have advised him to be better hydrated. I have asked him to change positions slowly and he is advised not to carry any heavy load on either side but also not to carry to things in both hands at once for fear of falling. He has to use at least his cane when walking. He has had difficulty complying with this in the past but unfortunately has fallen repeatedly. We will call his daughter Clarene Critchley with his head CT results. I would like for him to see our nurse practitioner, Charlott Holler in 3 months and I will see him back after that. They were in agreement. I answered all the questions today.

## 2014-04-19 ENCOUNTER — Ambulatory Visit
Admission: RE | Admit: 2014-04-19 | Discharge: 2014-04-19 | Disposition: A | Discharge status: Home / Self Care | Payer: Commercial Managed Care - HMO | Source: Ambulatory Visit | Attending: Neurology | Admitting: Neurology

## 2014-04-19 DIAGNOSIS — G609 Hereditary and idiopathic neuropathy, unspecified: Secondary | ICD-10-CM

## 2014-04-19 DIAGNOSIS — S32010S Wedge compression fracture of first lumbar vertebra, sequela: Secondary | ICD-10-CM

## 2014-04-19 DIAGNOSIS — F03918 Unspecified dementia, unspecified severity, with other behavioral disturbance: Secondary | ICD-10-CM

## 2014-04-19 DIAGNOSIS — F0391 Unspecified dementia with behavioral disturbance: Secondary | ICD-10-CM

## 2014-04-19 DIAGNOSIS — R296 Repeated falls: Secondary | ICD-10-CM

## 2014-04-19 DIAGNOSIS — R269 Unspecified abnormalities of gait and mobility: Secondary | ICD-10-CM

## 2014-04-19 DIAGNOSIS — F32A Depression, unspecified: Secondary | ICD-10-CM

## 2014-04-19 DIAGNOSIS — W19XXXA Unspecified fall, initial encounter: Secondary | ICD-10-CM

## 2014-04-19 DIAGNOSIS — F329 Major depressive disorder, single episode, unspecified: Secondary | ICD-10-CM

## 2014-04-20 ENCOUNTER — Telehealth: Payer: Self-pay | Admitting: Neurology

## 2014-04-20 NOTE — Progress Notes (Signed)
Quick Note:  Please call patient's wife or daughter about his CT head result. No acute changes were found. In fact, no significant changes from May of last year which is reassuring. Star Age, MD, PhD [facility] ([facility]  ______

## 2014-04-20 NOTE — Telephone Encounter (Signed)
Patient's wife calling on behalf of patient requesting CT scan results, please return call and advise.

## 2014-04-21 NOTE — Telephone Encounter (Signed)
IMPRESSION:  Mildly abnormal CT head (without) demonstrating:  1. Mild chronic small vessel ischemic disease, in the right caudate, right frontal juxtacortical, and left frontal periventricular regions.  2. No acute findings.  3. No change from MRI on 12/03/12.   Patient wife notified of CT head findings.

## 2014-04-25 NOTE — Progress Notes (Signed)
Quick Note:  Shared CT head results with daughter, she verbalized understanding but had some questions concerning patient diagnosis, is it dementia or alzheimer? Will discuss with Dr Rexene Alberts at patient's next visit ______

## 2014-04-26 ENCOUNTER — Ambulatory Visit (INDEPENDENT_AMBULATORY_CARE_PROVIDER_SITE_OTHER): Payer: Commercial Managed Care - HMO

## 2014-04-26 DIAGNOSIS — E538 Deficiency of other specified B group vitamins: Secondary | ICD-10-CM

## 2014-04-26 MED ORDER — CYANOCOBALAMIN 1000 MCG/ML IJ SOLN
1000.0000 ug | Freq: Once | INTRAMUSCULAR | Status: AC
  Administered 2014-04-26: 1000 ug via INTRAMUSCULAR

## 2014-07-05 ENCOUNTER — Other Ambulatory Visit: Payer: Self-pay | Admitting: Cardiology

## 2014-07-11 ENCOUNTER — Ambulatory Visit: Payer: Medicare PPO | Admitting: Adult Health

## 2014-07-12 DIAGNOSIS — M542 Cervicalgia: Secondary | ICD-10-CM | POA: Insufficient documentation

## 2014-07-12 DIAGNOSIS — M25519 Pain in unspecified shoulder: Secondary | ICD-10-CM | POA: Insufficient documentation

## 2014-07-30 DIAGNOSIS — M545 Low back pain: Secondary | ICD-10-CM | POA: Diagnosis not present

## 2014-07-30 DIAGNOSIS — M542 Cervicalgia: Secondary | ICD-10-CM | POA: Diagnosis not present

## 2014-07-30 DIAGNOSIS — M4806 Spinal stenosis, lumbar region: Secondary | ICD-10-CM | POA: Diagnosis not present

## 2014-07-30 DIAGNOSIS — M4804 Spinal stenosis, thoracic region: Secondary | ICD-10-CM | POA: Diagnosis not present

## 2014-07-30 DIAGNOSIS — M4802 Spinal stenosis, cervical region: Secondary | ICD-10-CM | POA: Diagnosis not present

## 2014-07-30 DIAGNOSIS — S32009A Unspecified fracture of unspecified lumbar vertebra, initial encounter for closed fracture: Secondary | ICD-10-CM | POA: Diagnosis not present

## 2014-08-16 DIAGNOSIS — F4542 Pain disorder with related psychological factors: Secondary | ICD-10-CM | POA: Diagnosis not present

## 2014-08-22 DIAGNOSIS — F4542 Pain disorder with related psychological factors: Secondary | ICD-10-CM | POA: Diagnosis not present

## 2014-09-03 ENCOUNTER — Other Ambulatory Visit: Payer: Self-pay | Admitting: Cardiology

## 2014-09-08 DIAGNOSIS — M5136 Other intervertebral disc degeneration, lumbar region: Secondary | ICD-10-CM | POA: Diagnosis not present

## 2014-09-08 DIAGNOSIS — M545 Low back pain: Secondary | ICD-10-CM | POA: Diagnosis not present

## 2014-09-15 DIAGNOSIS — M545 Low back pain: Secondary | ICD-10-CM | POA: Diagnosis not present

## 2014-09-15 DIAGNOSIS — M542 Cervicalgia: Secondary | ICD-10-CM | POA: Diagnosis not present

## 2014-09-15 DIAGNOSIS — M5136 Other intervertebral disc degeneration, lumbar region: Secondary | ICD-10-CM | POA: Diagnosis not present

## 2014-09-15 DIAGNOSIS — M546 Pain in thoracic spine: Secondary | ICD-10-CM | POA: Diagnosis not present

## 2014-09-15 DIAGNOSIS — M5416 Radiculopathy, lumbar region: Secondary | ICD-10-CM | POA: Diagnosis not present

## 2014-09-15 DIAGNOSIS — M5412 Radiculopathy, cervical region: Secondary | ICD-10-CM | POA: Diagnosis not present

## 2014-09-16 DIAGNOSIS — M7582 Other shoulder lesions, left shoulder: Secondary | ICD-10-CM | POA: Diagnosis not present

## 2014-09-22 ENCOUNTER — Telehealth: Payer: Self-pay | Admitting: Neurology

## 2014-09-22 NOTE — Telephone Encounter (Signed)
Patient has rescheduled and confirmed new appointment time change on 10/11/14

## 2014-10-11 ENCOUNTER — Encounter: Payer: Self-pay | Admitting: Neurology

## 2014-10-11 ENCOUNTER — Ambulatory Visit (INDEPENDENT_AMBULATORY_CARE_PROVIDER_SITE_OTHER): Payer: Commercial Managed Care - HMO | Admitting: Neurology

## 2014-10-11 VITALS — BP 139/53 | HR 64 | Resp 18 | Ht 72.0 in | Wt 181.0 lb

## 2014-10-11 DIAGNOSIS — R296 Repeated falls: Secondary | ICD-10-CM

## 2014-10-11 DIAGNOSIS — R269 Unspecified abnormalities of gait and mobility: Secondary | ICD-10-CM

## 2014-10-11 DIAGNOSIS — G4700 Insomnia, unspecified: Secondary | ICD-10-CM | POA: Diagnosis not present

## 2014-10-11 DIAGNOSIS — M5440 Lumbago with sciatica, unspecified side: Secondary | ICD-10-CM

## 2014-10-11 DIAGNOSIS — F329 Major depressive disorder, single episode, unspecified: Secondary | ICD-10-CM

## 2014-10-11 DIAGNOSIS — F0391 Unspecified dementia with behavioral disturbance: Secondary | ICD-10-CM

## 2014-10-11 DIAGNOSIS — Z9181 History of falling: Secondary | ICD-10-CM | POA: Diagnosis not present

## 2014-10-11 DIAGNOSIS — F03918 Unspecified dementia, unspecified severity, with other behavioral disturbance: Secondary | ICD-10-CM

## 2014-10-11 DIAGNOSIS — F32A Depression, unspecified: Secondary | ICD-10-CM

## 2014-10-11 DIAGNOSIS — M5430 Sciatica, unspecified side: Secondary | ICD-10-CM | POA: Diagnosis not present

## 2014-10-11 MED ORDER — DIVALPROEX SODIUM ER 500 MG PO TB24
1000.0000 mg | ORAL_TABLET | Freq: Every day | ORAL | Status: DC
Start: 2014-10-11 — End: 2015-08-23

## 2014-10-11 MED ORDER — TRAZODONE HCL 100 MG PO TABS: 200.0000 mg | ORAL_TABLET | Freq: Every day | ORAL | Status: DC

## 2014-10-11 MED ORDER — DONEPEZIL HCL 10 MG PO TABS: 10.0000 mg | ORAL_TABLET | Freq: Every morning | ORAL | Status: DC

## 2014-10-11 NOTE — Patient Instructions (Addendum)
Taking Mobic daily can be harsh on the stomach. Please take over the counter pepcid in the least or ask Dr. Garnette Gunner about how to protect your stomach lining.  Drink more water and reduce your tea and soda intake! Use your cane at all times. Do not work on the curbs or edges, as you can fall of the curb.  We will continue with the current meds from my end: Trazodone 200 mg each night is HIGH dose and my goal is to be able to reduce it some.  We will continue with Aricept 10 mg daily - your memory is thankfully stable. We will continue with Depakote 1000 mg each night, again a higher dose, and we will revisit this in the near future. You did not do well with dose reduction in the recent past.   We will do blood work today and call [name] with the results.

## 2014-10-11 NOTE — Progress Notes (Signed)
Subjective:    Patient ID: Alexander Booth is a 69 y.o. male.  HPI     Interim history:  Alexander Booth is a very pleasant 69 year old Alexander Booth with an underlying medical history of hypertension, diabetes, hyperlipidemia, hearing loss, smoking, and degenerative neck disease, status post neck surgery in October 2012 with ACDF, who presents for followup consultation of his memory loss and gait disorder. Alexander Booth is accompanied by his daughter, Alexander Booth, again today. I last saw him on 04/11/2014, at which time Alexander Booth had recently undergone a steroid injection into his lower back which helped for a few days. Alexander Booth had some medication changes. Alexander Booth had been on tramadol, and got agitated when his Depakote was reduced. Alexander Booth was no longer on hydrocodone or mobic. Alexander Booth had fallen 4 or 5 times. No loss of consciousness or head laceration was reported. Alexander Booth was carrying buckets of water and was still trying to be active in the yard. Most of his falls occurred outside. I ordered a Loop wo contrast due to recurrent falls. Alexander Booth had this on 04/19/2014: Mildly abnormal CT head (without) demonstrating:  1. Mild chronic small vessel ischemic disease, in the right caudate, right frontal juxtacortical, and left frontal periventricular regions. 2. No acute findings. 3. No change from MRI on 12/03/12. In addition, personally reviewed the images through the PACS system. We called his family with his test results.  Today, 10/11/2014: Alexander Booth provides some additional history. She states that patient fell about 2-3 weeks ago when Alexander Booth was using the agger and Alexander Booth was working around a curb and slipped off a curb. Alexander Booth did not hit his head. Alexander Booth had a trial of a spinal cord stimulator in February and it helped his low back pain but did not help his upper back pain. Alexander Booth has not fallen otherwise. Alexander Booth is more depressed. In the past, we tried to reduce his Depakote but it did not go well. Alexander Booth is not taking Remeron as I understand. Alexander Booth is still on Aricept 10 mg once  daily, Depakote thousand milligrams once daily, trazodone 200 mg at night. I have expressed my concern repeatedly in the past that Alexander Booth is on high-dose trazodone but Alexander Booth is not able to sleep otherwise. Chronic pain and severe arthritis pain is still the biggest issue for him. Alexander Booth is on hydrocodone, 7.5 mg twice daily. Alexander Booth is back on Mobic 15 mg daily. Alexander Booth says his stomach hurts when Alexander Booth eats. Alexander Booth is not taking anything for acid reduction or PPI. Alexander Booth has not seen his primary care physician since April last year. No appointment is pending. Alexander Booth had some blood work in April which I reviewed and also some blood work in August 2015 which I reviewed. Alexander Booth has not had blood work in the last 6 months. Alexander Booth has an appointment with Dr. Vertell Limber in neurosurgery to talk about permanent spinal cord stimulator placement. Alexander Booth does not drink enough water, perhaps half a cup per day and usually drinks sodas and sweet tea.    Previously:   I saw him on 12/09/2013, at which time Alexander Booth was primarily complaining of chronic pain in his lower back. Alexander Booth was seeing a pain specialist and had undergone steroid epidural injections. We talked about reducing his Depakote. Alexander Booth was advised to use his cane consistently and we talked about fall precautions again.  I saw him on 07/27/13, at which time we talked about fall safety, and I expressed concern regarding sedating medications that may contribute to his falls. His daughter reported 2 recent  falls. I suggested we reduce his trazodone to total of 150 mg each night and his Depakote ER from 1000 mg to 500 mg. We talked about his back pain and Alexander Booth was supposed to see his neurosurgeon again and I advised him to discuss physical therapy and pain management with Dr. Ellene Route. Of note, Alexander Booth talked to me in private without patient's knowledge, per her request, and she mentions, that the patient's father died recently and there is a dispute regarding the inheritance and the house. It turns out, the father was not his real  father, and his Alexander Booth has 2 half-siblings. The patient has a son and another daughter, and the son has been pressuring the patient to keep going to the attorney. His daughter becomes tearful as she relates to me how much stress with her family has been under recently and she feels that her father is not in any shape to endure this kind of stress. She is particularly worried about his stroke risk under pressure. She wants his son which is her brother to not bother him with the attorney issues. There have been clashes between the patient and his half sibling since his father died who turned out to be his stepfather. His mother died over 47 years ago from [place] and there is really no one who can tell them about his real father. His wife has had to take care of her mother and has been staying with her mother.    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 Alexander Booth had a recent L1 compression fracture and had to have surgery for this. Alexander Booth had a fall with injury recently while working on his roof despite my advice not to climb on roofs or climb on ladders. I advised him to start using a rolling walker with sliders in the back because of risk for falling and worsening of his gait which certainly took a setback after his fall. Alexander Booth had a repeat lumbar spine MRI on 07/17/2013: Interval L1-2 fusion. The central canal and foramina are open. No evidence of complication from surgery. The patient's examination is otherwise unchanged. No change in extra foraminal disc bulging on the left at L4-5 contacting the exited left L4 root. No change in a right lateral recess narrowing at L5-S1 with mild encroachment on the descending right S1 root. There is also mild right foraminal narrowing at this level.   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 I  discussed his recent brain MRI and lumbar spine MRI results at the time. Alexander Booth had evidence of chronic white matter changes and we talked about prevention of cardiovascular disease at the time. Alexander Booth had stopped taking his metformin about 2-3 weeks prior to his June visit with me. Alexander Booth was advised to continue to measure his capillary blood glucose levels. I felt that his gait disorder and balance problems for most likely multifactorial in nature, due to chronic back pain, neuropathy and atherosclerotic changes. Alexander Booth had a fall with injury when Alexander Booth had climbed on the roof despite my advice not to climb on any heights. Alexander Booth was re-educated in that regard and also advised not to use any heavy machinery. Alexander Booth also has a history of significant hearing loss.   Alexander Booth previously followed with Dr. Morene Antu since 2004.    I first met him on  09/29/2012 at which time I kept him on the same medications. Alexander Booth was advised to get a repeat brain MRI.   Neuropsychological testing was done in December 2004 which showed full-scale IQ of 2, verbal IQ of 44, a performance IQ of 49. MMSE was in the 28 range. MRI brain in 2004 was normal, a PET scan was positive for Alzheimer's, with normal TSH, RPR, HIV, B12 and folate. Alexander Booth did have a borderline low B12. Alexander Booth had a normal methylmalonic acid level. Sleep study showed mild to moderate OSA but Alexander Booth could not tolerate CPAP. Alexander Booth has been on Aricept since March 2005. Alexander Booth has had burning foot pain for which gabapentin and Lyrica were tried without help. In March 2013 his MMSE was 24, clock drawing was 4, and normal fluency was 11. In January his MMSE was 23, clock drawing was 4, animal fluency was 9. His brain MRI showed a nonspecific posterior right parietal lobe lesion without contrast uptake or edema. A followup MRI was recommended. Alexander Booth had a brain MRI with and without contrast on 12/03/12, which showed nonspecific WM changes.    His Past Medical History Is Significant For: Past Medical History  Diagnosis Date   . Hypertension     Essential  . Hypercholesterolemia   . Lumbosacral disc disease     with chronic low back pain  . Bronchitis     Subacute and hx of ongoing cigarette smoking one half pack daily  . Diabetes mellitus     Alexander Booth's also had a hx of high cholesterol   . FHx: heart disease     Positive for   . Dyslipidemia     Alexander Booth also has a past hx of   . Type 2 diabetes mellitus 2010    takes oral meds  . Fracture of lumbar vertebra 11/25/2012  . Hard of hearing     very little hearing on left side;better in right ear  . Erectile dysfunction     Alexander Booth's had a hx of   . Depression   . Presenile dementia   . Dementia     Alexander Booth has a hx of the   . Peripheral neuropathy   . Peripheral neuropathy     Alexander Booth also has     His Past Surgical History Is Significant For: Past Surgical History  Procedure Laterality Date  . Knee surgery      Previous left total replacement  . Hemorrhoid surgery      x2  . Other surgical history  1992    Bone Fusion in Right Foot  . Anterior cervical decomp/discectomy fusion    . Appendectomy    . Joint replacement    . Total knee arthroplasty Left   . Kyphoplasty N/A 12/10/2012    Procedure: Lumbar one Kyphoplasty;  Surgeon: Kristeen Miss, MD;  Location: [facility];  Service: Neurosurgery;  Laterality: N/A;  Lumbar One Kyphoplasty    His Family History Is Significant For: Family History  Problem Relation Age of Onset  . Multiple myeloma Mother   . Diabetes Mother   . Cancer Sister     lymphoma  . Diabetes Sister   . Stroke Neg Hx     His Social History Is Significant For: History   Social History  . Marital Status: Married    Spouse Name: Vaughan Basta  . Number of Children: 3  . Years of Education: 12   Occupational History  .      retired   Social History Main  Topics  . Smoking status: Current Every Day Smoker -- 1.00 packs/day for 60 years  . Smokeless tobacco: Never Used  . Alcohol Use: No  . Drug Use: No  . Sexual Activity: Not on file    Other Topics Concern  . None   Social History Narrative   Patient is right handed   Patient resides with wife,consumes 2-3 cups of caffeine daily    His Allergies Are:  Allergies  Allergen Reactions  . Codeine     Itch   :   His Current Medications Are:  Outpatient Encounter Prescriptions as of 10/11/2014  Medication Sig  . Bisacodyl (LAXATIVE PO) 5 mg. Biscodyl  . cyanocobalamin (,VITAMIN B-12,) 1000 MCG/ML injection   . digoxin (LANOXIN) 0.125 MG tablet Take 1 tablet (0.125 mg total) by mouth daily.  . divalproex (DEPAKOTE ER) 500 MG 24 hr tablet Take 2 tablets (1,000 mg total) by mouth daily.  Marland Kitchen donepezil (ARICEPT) 10 MG tablet Take 1 tablet (10 mg total) by mouth every morning.  Marland Kitchen FLUoxetine (PROZAC) 20 MG capsule Take 1 capsule (20 mg total) by mouth daily.  Marland Kitchen FLUZONE HIGH-DOSE 0.5 ML SUSY   . IBUPROFEN PO Take 1 tablet by mouth as needed.  . Isopropyl Alcohol (ALCOHOL PREP PADS EX)   . mirtazapine (REMERON) 15 MG tablet Take 1 tablet (15 mg total) by mouth at bedtime.  . nitroGLYCERIN (NITROSTAT) 0.4 MG SL tablet Place 1 tablet (0.4 mg total) under the tongue every 5 (five) minutes as needed for chest pain.  . Oral Thermometer MISC   . OVER THE COUNTER MEDICATION Triple antibiotic ointment Plus,As needed  . simvastatin (ZOCOR) 40 MG tablet TAKE 1 TABLET EVERY EVENING (NEED APPOINTMENT)  . tiZANidine (ZANAFLEX) 4 MG tablet As needed  . traMADol (ULTRAM) 50 MG tablet Take 1 tablet (50 mg total) by mouth every 8 (eight) hours as needed.  . traZODone (DESYREL) 100 MG tablet Take 2 tablets (200 mg total) by mouth at bedtime.  :  Review of Systems:  Out of a complete 14 point review of systems, all are reviewed and negative with the exception of these symptoms as listed below:   Review of Systems  Constitutional: Positive for fatigue.    Objective:  Neurologic Exam  Physical Exam Physical Examination:   Filed Vitals:   10/11/14 0958  BP: 139/53  Pulse: 64   Resp: 18    General Examination: The patient is a very pleasant 69 y.o. male in no acute distress. Alexander Booth appears frail and thinner than before and is adequately groomed. Alexander Booth is somewhat agitated/anxious/impatient appearing today.   HEENT exam: Normocephalic, atraumatic, pupils are equal, round and reactive to light and accommodation. Extraocular tracking is mildly impaired. Alexander Booth is hard of hearing, which is unchanged. Funduscopic exam is unremarkable. Neck is supple but there is decrease in range of motion passively. Alexander Booth is status post 2 neck surgeries. Alexander Booth has no facial masking and speech is clear but scant. Alexander Booth has a slight head tremor. Oropharynx exam shows moderate mouth dryness. Tongue protrudes centrally and palate elevates symmetrically.  Chest is clear to auscultation without wheezing, rhonchi or crackles noted.  Heart sounds are normal without murmurs, rubs or gallops.  Abdomen is soft, nontender with normal bowel sounds appreciated. Skin is dry. Alexander Booth does not have any obvious joint deformities but complains of lower back pain. Alexander Booth had left knee replacement surgery. Neurologically: Mental status: The patient is awake, alert and oriented to self, circumstance.  In 1/15: MMSE  23/30.   On 04/11/2014: MMSE: 26/30, CDT: 4/4/, AFT: 11. Geriatric depression score: 9/15.    On 10/11/2014: MMSE: 26/30, CDT: 4/4, AFT: 12, GDS: 12/15.  Cranial nerves are as described above. Motor exam reveals normal bulk and tone. Alexander Booth has no drift, or rebound. Alexander Booth is a minimal postural and action tremor in both upper extremities and no resting tremor. Reflexes are 1+ in the upper extremities and absent in the LEs. Cerebellar testing shows no dysmetria or intention tremor. Sensory exam is intact to light touch, but decrease to pinprick in the upper extremities distally, and Alexander Booth has decreased sensation to pinprick, vibration and temperature in the distal lower extremities to mid shin areas bilaterally, unchanged. Romberg is not tested  today. His gait shows decreased stride length and pace and walks insecurely, Alexander Booth uses his cane. Alexander Booth turns slowly. His balance is impaired.  Assessment and Plan:   In summary, Mr. Raudenbush is a very pleasant 69 year old Alexander Booth with a history of degenerative joint disease, neuropathy, chronic back pain and dementia, who presents for follow-up of his gait d/o and memory loss. His memory has been stable, which is reassuring. Alexander Booth has chronic back pain, andis being evaluated for spinal cord stimulator placement. Alexander Booth had a trial placement in February 2016. Alexander Booth has residual depression. Alexander Booth has been on generic Prozac. Alexander Booth is no longer on Remeron as I understand. Alexander Booth is back on hydrocodone. Alexander Booth still on Mobile every day. I worry about mopey and his stomach pain. Alexander Booth is advised to at least start a Pepcid over-the-counter. Alexander Booth is advised to talk to his primary care provider about depression and stomach protection. Alexander Booth is advised to drink more water and reduces soda and tea intake. We will do blood work to monitor his kidney, liver function, CBC and Depakote level. We will keep him on Aricept for his memory, Depakote for his mood stabilization, and I will maintain trazodone 200 mg at night for now. Hopefully, in the near future, if his pain is under better control after the pain stimulator placement, my hope is that Alexander Booth will start sleeping a little bit better and we can start thinking about medication reductions far as the Depakote and trazodone go. Alexander Booth is not on tramadol at this time. I will see him back routinely in 6 months. I have advised him to use his cane at all times and no longer work at the curbs with an Metallurgist as Alexander Booth can slip off the curb. Alexander Booth is advised about his recent CT head results.  I have asked him to change positions slowly and Alexander Booth is advised not to carry any heavy load on either side but also not to carry to things in both hands at once for fear of falling. Alexander Booth has to use at least his cane when walking. Alexander Booth has had  difficulty complying with this in the past and has unfortunately fallen repeatedly. We will call his daughter Alexander Booth with his blood test results. I answered all their questions today and the patient and his daughter were in agreement.  I spent 30 minutes in total face-to-face time with the patient, more than 50% of which was spent in counseling and coordination of care, reviewing test results, reviewing medication and discussing or reviewing the diagnosis of gait disorder, chronic pain, memory loss, insomnia, the prognosis and treatment options interconnection of all of his symptoms.

## 2014-10-12 LAB — COMPREHENSIVE METABOLIC PANEL
ALK PHOS: 43 IU/L (ref 39–117)
ALT: 11 IU/L (ref 0–44)
AST: 16 IU/L (ref 0–40)
Albumin/Globulin Ratio: 2.1 (ref 1.1–2.5)
Albumin: 4.1 g/dL (ref 3.5–4.8)
BUN/Creatinine Ratio: 13 (ref 10–22)
BUN: 11 mg/dL (ref 8–27)
Bilirubin Total: 0.4 mg/dL (ref 0.0–1.2)
CALCIUM: 9.4 mg/dL (ref 8.6–10.2)
CHLORIDE: 101 mmol/L (ref 97–108)
CO2: 27 mmol/L (ref 18–29)
CREATININE: 0.82 mg/dL (ref 0.76–1.27)
GFR calc Af Amer: 104 mL/min/{1.73_m2} (ref >59)
GFR calc non Af Amer: 90 mL/min/{1.73_m2} (ref >59)
GLOBULIN, TOTAL: 2 g/dL (ref 1.5–4.5)
Glucose: 86 mg/dL (ref 65–99)
POTASSIUM: 4.7 mmol/L (ref 3.5–5.2)
SODIUM: 141 mmol/L (ref 134–144)
Total Protein: 6.1 g/dL (ref 6.0–8.5)

## 2014-10-12 LAB — CBC WITH DIFFERENTIAL/PLATELET
Basophils Absolute: 0 10*3/uL (ref 0.0–0.2)
Basos: 1 %
Eos: 1 %
Eosinophils Absolute: 0 10*3/uL (ref 0.0–0.4)
HEMATOCRIT: 42.2 % (ref 37.5–51.0)
Hemoglobin: 14.6 g/dL (ref 12.6–17.7)
Immature Grans (Abs): 0 10*3/uL (ref 0.0–0.1)
Immature Granulocytes: 0 %
Lymphocytes Absolute: 1.3 10*3/uL (ref 0.7–3.1)
Lymphs: 21 %
MCH: 33.2 pg — ABNORMAL HIGH (ref 26.6–33.0)
MCHC: 34.6 g/dL (ref 31.5–35.7)
MCV: 96 fL (ref 79–97)
MONOCYTES: 7 %
Monocytes Absolute: 0.4 10*3/uL (ref 0.1–0.9)
Neutrophils Absolute: 4.4 10*3/uL (ref 1.4–7.0)
Neutrophils Relative %: 70 %
Platelets: 126 10*3/uL — ABNORMAL LOW (ref 150–379)
RBC: 4.4 x10E6/uL (ref 4.14–5.80)
RDW: 13.5 % (ref 12.3–15.4)
WBC: 6.2 10*3/uL (ref 3.4–10.8)

## 2014-10-12 LAB — VALPROIC ACID LEVEL: Valproic Acid Lvl: 61 ug/mL (ref 50–100)

## 2014-10-12 NOTE — Progress Notes (Signed)
Quick Note:  Please call patient or probably better to call his daughter who was here with him yesterday, [name]. Blood work is fine with the exception of low platelets. These are not critically low by any means and he has been having low platelets all along. In fact 3 years ago they were lower than what they are now. He should probably be followed for this at least every 6 months with a CBC. Primary care provider can monitor blood work. I cannot explain the low platelets. It may be related to Depakote but in essence just needs to be followed with blood tests. No further action is required at this time. I do want to remind his daughter to find out if he is on Remeron or not. Star Age, MD, PhD [facility] ([facility]  ______

## 2014-10-25 ENCOUNTER — Ambulatory Visit (INDEPENDENT_AMBULATORY_CARE_PROVIDER_SITE_OTHER): Payer: Commercial Managed Care - HMO | Admitting: Internal Medicine

## 2014-10-25 ENCOUNTER — Encounter: Payer: Self-pay | Admitting: Internal Medicine

## 2014-10-25 VITALS — BP 134/66 | HR 64 | Temp 97.8°F | Ht 72.0 in | Wt 182.0 lb

## 2014-10-25 DIAGNOSIS — Z0000 Encounter for general adult medical examination without abnormal findings: Secondary | ICD-10-CM | POA: Diagnosis not present

## 2014-10-25 DIAGNOSIS — N508 Other specified disorders of male genital organs: Secondary | ICD-10-CM

## 2014-10-25 DIAGNOSIS — Z125 Encounter for screening for malignant neoplasm of prostate: Secondary | ICD-10-CM

## 2014-10-25 DIAGNOSIS — I10 Essential (primary) hypertension: Secondary | ICD-10-CM | POA: Diagnosis not present

## 2014-10-25 DIAGNOSIS — E1140 Type 2 diabetes mellitus with diabetic neuropathy, unspecified: Secondary | ICD-10-CM

## 2014-10-25 DIAGNOSIS — M549 Dorsalgia, unspecified: Secondary | ICD-10-CM

## 2014-10-25 DIAGNOSIS — E785 Hyperlipidemia, unspecified: Secondary | ICD-10-CM

## 2014-10-25 DIAGNOSIS — F0391 Unspecified dementia with behavioral disturbance: Secondary | ICD-10-CM

## 2014-10-25 DIAGNOSIS — J438 Other emphysema: Secondary | ICD-10-CM

## 2014-10-25 DIAGNOSIS — G479 Sleep disorder, unspecified: Secondary | ICD-10-CM | POA: Diagnosis not present

## 2014-10-25 DIAGNOSIS — N50819 Testicular pain, unspecified: Secondary | ICD-10-CM

## 2014-10-25 DIAGNOSIS — M1990 Unspecified osteoarthritis, unspecified site: Secondary | ICD-10-CM

## 2014-10-25 DIAGNOSIS — J439 Emphysema, unspecified: Secondary | ICD-10-CM

## 2014-10-25 DIAGNOSIS — F03918 Unspecified dementia, unspecified severity, with other behavioral disturbance: Secondary | ICD-10-CM

## 2014-10-25 DIAGNOSIS — J449 Chronic obstructive pulmonary disease, unspecified: Secondary | ICD-10-CM | POA: Insufficient documentation

## 2014-10-25 DIAGNOSIS — G8929 Other chronic pain: Secondary | ICD-10-CM

## 2014-10-25 LAB — COMPREHENSIVE METABOLIC PANEL
ALK PHOS: 42 U/L (ref 39–117)
ALT: 11 U/L (ref 0–53)
AST: 15 U/L (ref 0–37)
Albumin: 3.9 g/dL (ref 3.5–5.2)
BILIRUBIN TOTAL: 0.5 mg/dL (ref 0.2–1.2)
BUN: 14 mg/dL (ref 6–23)
CALCIUM: 9.7 mg/dL (ref 8.4–10.5)
CHLORIDE: 100 meq/L (ref 96–112)
CO2: 34 mEq/L — ABNORMAL HIGH (ref 19–32)
Creatinine, Ser: 0.95 mg/dL (ref 0.40–1.50)
GFR: 83.06 mL/min (ref >60.00)
Glucose, Bld: 170 mg/dL — ABNORMAL HIGH (ref 70–99)
POTASSIUM: 4.5 meq/L (ref 3.5–5.1)
Sodium: 135 mEq/L (ref 135–145)
Total Protein: 6.2 g/dL (ref 6.0–8.3)

## 2014-10-25 LAB — CBC
HEMATOCRIT: 40.5 % (ref 39.0–52.0)
HEMOGLOBIN: 14.1 g/dL (ref 13.0–17.0)
MCHC: 34.8 g/dL (ref 30.0–36.0)
MCV: 96.7 fl (ref 78.0–100.0)
PLATELETS: 135 10*3/uL — AB (ref 150.0–400.0)
RBC: 4.2 Mil/uL — AB (ref 4.22–5.81)
RDW: 13.4 % (ref 11.5–15.5)
WBC: 6.9 10*3/uL (ref 4.0–10.5)

## 2014-10-25 LAB — LIPID PANEL
Cholesterol: 164 mg/dL (ref 0–200)
HDL: 31.9 mg/dL — ABNORMAL LOW (ref >39.00)
NonHDL: 132.1
TRIGLYCERIDES: 226 mg/dL — AB (ref 0.0–149.0)
Total CHOL/HDL Ratio: 5
VLDL: 45.2 mg/dL — AB (ref 0.0–40.0)

## 2014-10-25 LAB — PSA, MEDICARE: PSA: 0.68 ng/ml (ref 0.10–4.00)

## 2014-10-25 LAB — MICROALBUMIN / CREATININE URINE RATIO
Creatinine,U: 46.4 mg/dL
Microalb Creat Ratio: 1.5 mg/g (ref 0.0–30.0)
Microalb, Ur: <0.7 mg/dL (ref 0.0–1.9)

## 2014-10-25 LAB — HEMOGLOBIN A1C: Hgb A1c MFr Bld: 5.8 % (ref 4.6–6.5)

## 2014-10-25 LAB — LDL CHOLESTEROL, DIRECT: LDL DIRECT: 85 mg/dL

## 2014-10-25 NOTE — Assessment & Plan Note (Signed)
Not medicated Foot exam today Will check A1C and microalbumin today

## 2014-10-25 NOTE — Assessment & Plan Note (Signed)
Discussed smoking cessation He is not interested in quitting at this time

## 2014-10-25 NOTE — Progress Notes (Signed)
HPI:  Pt presents to the clinic today for his medicare wellness exam. He is also due for follow up of chronic conditions, see separate note.  Past Medical History  Diagnosis Date  . Hypertension     Essential  . Hypercholesterolemia   . Lumbosacral disc disease     with chronic low back pain  . Bronchitis     Subacute and hx of ongoing cigarette smoking one half pack daily  . Diabetes mellitus     He's also had a hx of high cholesterol   . FHx: heart disease     Positive for   . Dyslipidemia     He also has a past hx of   . Type 2 diabetes mellitus 2010    takes oral meds  . Fracture of lumbar vertebra 11/25/2012  . Hard of hearing     very little hearing on left side;better in right ear  . Erectile dysfunction     He's had a hx of   . Depression   . Presenile dementia   . Dementia     He has a hx of the   . Peripheral neuropathy   . Peripheral neuropathy     He also has     Current Outpatient Prescriptions  Medication Sig Dispense Refill  . Bisacodyl (LAXATIVE PO) 5 mg. Biscodyl    . cyanocobalamin (,VITAMIN B-12,) 1000 MCG/ML injection     . digoxin (LANOXIN) 0.125 MG tablet Take 1 tablet (0.125 mg total) by mouth daily. 90 tablet 3  . divalproex (DEPAKOTE ER) 500 MG 24 hr tablet Take 2 tablets (1,000 mg total) by mouth daily. 180 tablet 3  . donepezil (ARICEPT) 10 MG tablet Take 1 tablet (10 mg total) by mouth every morning. 90 tablet 3  . FLUoxetine (PROZAC) 20 MG capsule Take 1 capsule (20 mg total) by mouth daily. 90 capsule 3  . FLUZONE HIGH-DOSE 0.5 ML SUSY     . IBUPROFEN PO Take 1 tablet by mouth as needed.    . Isopropyl Alcohol (ALCOHOL PREP PADS EX)     . mirtazapine (REMERON) 15 MG tablet Take 1 tablet (15 mg total) by mouth at bedtime. 30 tablet 1  . nitroGLYCERIN (NITROSTAT) 0.4 MG SL tablet Place 1 tablet (0.4 mg total) under the tongue every 5 (five) minutes as needed for chest pain. 25 tablet prn  . Oral Thermometer MISC     . OVER THE COUNTER  MEDICATION Triple antibiotic ointment Plus,As needed    . simvastatin (ZOCOR) 40 MG tablet TAKE 1 TABLET EVERY EVENING (NEED APPOINTMENT) 90 tablet 0  . tiZANidine (ZANAFLEX) 4 MG tablet As needed    . traMADol (ULTRAM) 50 MG tablet Take 1 tablet (50 mg total) by mouth every 8 (eight) hours as needed. 90 tablet 0  . traZODone (DESYREL) 100 MG tablet Take 2 tablets (200 mg total) by mouth at bedtime. 180 tablet 3  . [DISCONTINUED] sildenafil (VIAGRA) 50 MG tablet Take 1 tablet (50 mg total) by mouth as needed. 5 tablet 3   No current facility-administered medications for this visit.    Allergies  Allergen Reactions  . Codeine     Itch     Family History  Problem Relation Age of Onset  . Multiple myeloma Mother   . Diabetes Mother   . Cancer Sister     lymphoma  . Diabetes Sister   . Stroke Neg Hx     History   Social History  .  Marital Status: Married    Spouse Name: Vaughan Basta  . Number of Children: 3  . Years of Education: 12   Occupational History  .      retired   Social History Main Topics  . Smoking status: Current Every Day Smoker -- 1.00 packs/day for 60 years  . Smokeless tobacco: Never Used  . Alcohol Use: No  . Drug Use: No  . Sexual Activity: Not on file   Other Topics Concern  . Not on file   Social History Narrative   Patient is right handed   Patient resides with wife,consumes 2-3 cups of caffeine daily    Hospitiliaztions: None  Health Maintenance:    Flu: 03/2014  Tetanus: 2014  Pneumovax: 2014  Prevnar: unsure  Zostavax: never  PSA Screening: 10/26/2013  Colonoscopy: many years ago, no longer want to screen  Eye Doctor: 04/2014?  Dental Exam: biannually  Providers:    PCP: Webb Silversmith, NP-C  Neurology: Dr. Rexene Alberts  Cardiology: Dr. Mare Ferrari  Neurosurgeon: Dr. [name]  I have personally reviewed and have noted:  1. The patient's medical and social history 2. Their use of alcohol, tobacco or illicit drugs 3. Their current medications  and supplements 4. The patient's functional ability including ADL's, fall risks, home  safety risks and hearing or visual impairment. 5. Diet and physical activities 6. Evidence for depression or mood disorder  Subjective:   Review of Systems:   Constitutional: Pt reports fatigue. Denies fever, malaise, headache or abrupt weight changes.  HEENT: Denies eye pain, eye redness, ear pain, ringing in the ears, wax buildup, runny nose, nasal congestion, bloody nose, or sore throat. Respiratory: Denies difficulty breathing, shortness of breath, cough or sputum production.   Cardiovascular: Denies chest pain, chest tightness, palpitations or swelling in the hands or feet.  Gastrointestinal: Denies abdominal pain, bloating, constipation, diarrhea or blood in the stool.  GU: Pt reports testicle pain. Denies urgency, frequency, pain with urination, burning sensation, blood in urine, odor or discharge. Musculoskeletal: Pt reports back pain. Denies muscle pain or joint pain and swelling.  Skin: Pt reports skin lesions on face. Denies redness, rashes, or ulcercations.  Neurological: Pt reports difficulty with memory and trouble with balance. Denies dizziness, difficulty with speech. Psych: Pt reports anxiety, depression, SI/HI.  No other specific complaints in a complete review of systems (except as listed in HPI above).  Objective:  PE:   BP 134/66 mmHg  Pulse 64  Temp(Src) 97.8 F (36.6 C) (Oral)  Ht 6' (1.829 m)  Wt 182 lb (82.555 kg)  BMI 24.68 kg/m2  SpO2 98%  Wt Readings from Last 3 Encounters:  10/11/14 181 lb (82.101 kg)  04/11/14 167 lb (75.751 kg)  03/10/14 165 lb 8 oz (75.07 kg)    General: Appears his stated age, well developed, well nourished in NAD. Cardiovascular: Normal rate and rhythm. S1,S2 noted.  No murmur, rubs or gallops noted. No JVD or BLE edema. No carotid bruits noted. Pulmonary/Chest: Normal effort and positive vesicular breath sounds. No respiratory distress.  No wheezes, rales or ronchi noted.  Abdomen: Soft and nontender. Normal bowel sounds, no bruits noted. No distention or masses noted. Liver, spleen and kidneys non palpable.   BMET    Component Value Date/Time   NA 141 10/11/2014 1122   NA 135 02/07/2014 1617   K 4.7 10/11/2014 1122   CL 101 10/11/2014 1122   CO2 27 10/11/2014 1122   GLUCOSE 86 10/11/2014 1122   GLUCOSE 86 02/07/2014 1617  BUN 11 10/11/2014 1122   BUN 9 02/07/2014 1617   CREATININE 0.82 10/11/2014 1122   CREATININE 0.72 03/26/2013 1608   CALCIUM 9.4 10/11/2014 1122   GFRNONAA 90 10/11/2014 1122   GFRAA 104 10/11/2014 1122    Lipid Panel     Component Value Date/Time   CHOL 157 10/26/2013 1406   TRIG 292.0* 10/26/2013 1406   HDL 32.70* 10/26/2013 1406   CHOLHDL 5 10/26/2013 1406   VLDL 58.4* 10/26/2013 1406   LDLCALC 66 10/26/2013 1406    CBC    Component Value Date/Time   WBC 6.2 10/11/2014 1122   WBC 7.1 02/07/2014 1617   RBC 4.40 10/11/2014 1122   RBC 4.18* 02/07/2014 1617   HGB 14.6 10/11/2014 1122   HCT 42.2 10/11/2014 1122   PLT 126* 10/11/2014 1122   MCV 96 10/11/2014 1122   MCH 33.2* 10/11/2014 1122   MCH 34.1* 04/14/2013 1450   MCHC 34.6 10/11/2014 1122   MCHC 34.6 02/07/2014 1617   RDW 13.5 10/11/2014 1122   RDW 13.6 02/07/2014 1617   LYMPHSABS 1.3 10/11/2014 1122   EOSABS 0.0 10/11/2014 1122   BASOSABS 0.0 10/11/2014 1122    Hgb A1C Lab Results  Component Value Date   HGBA1C 5.9 10/26/2013      Assessment and Plan:   Medicare Annual Wellness Visit:  Diet: He does not adhere to any specific diet Physical activity: Sedentary, he does work in the yard and [employer]. Depression/mood screen: Negative Hearing: Decreased Visual acuity: Grossly normal, performs annual eye exam  ADLs: Capable Fall risk: High Home safety: Good Cognitive evaluation: Intact to orientation, naming, recall and repetition EOL planning: Adv directives, DNR/I agree  Preventative Medicine: He is not  interested in getting the Prevnar or screening for colon cancer. Will check PSA today.  Next appointment: 6 month follow up of chronic conditions    HPI:  Pt presents to the clinic today for 6 month follow up of chronic conditions.  HTN: BP has been stable. He is following with Dr. Mare Ferrari. He is on Digoxin but does not know why. He denies history of heart failure or arrhythmia.  HLD: he denies myalgias on Zocor. He does try to consume a low fat diet.  Dementia with behavioral disturbance: Seems slightly worse. He does follow with neurology for this. He is taking Aricept, Prozac and Depakote. He no longer drives. He is capable of all ADL's at this time.  Chronic back pain: He reports he is about to get a spinal cord stimulator. He is being referred to Dr. Donald Pore to discuss having this done. He reports his back hurts him all the time. He takes Ibuprofen and Zanaflex for pain.  DM 2 with Neuropathy: He is not checking his sugars. He reports he does feel shaky throughout the day, but eats candy and feels better. He is getting yearly eye exam. Flu and pneumonia shots are UTD. He does have issues with neuropathy in his feet but is not currently taking any medication for it.  OA: Mostly in his back. Has chronic back pain. Takes Ibuprofen and Zanaflex.  Sleep Disturbance: Controlled with trazadone at night.  Emphysema: He continues to smoke. He denies cough or shortness of breath.   Review of Systems:  Past Medical History  Diagnosis Date  . Hypertension     Essential  . Hypercholesterolemia   . Lumbosacral disc disease     with chronic low back pain  . Bronchitis     Subacute and hx  of ongoing cigarette smoking one half pack daily  . Diabetes mellitus     He's also had a hx of high cholesterol   . FHx: heart disease     Positive for   . Dyslipidemia     He also has a past hx of   . Type 2 diabetes mellitus 2010    takes oral meds  . Fracture of lumbar vertebra 11/25/2012  .  Hard of hearing     very little hearing on left side;better in right ear  . Erectile dysfunction     He's had a hx of   . Depression   . Presenile dementia   . Dementia     He has a hx of the   . Peripheral neuropathy   . Peripheral neuropathy     He also has     Current Outpatient Prescriptions  Medication Sig Dispense Refill  . Bisacodyl (LAXATIVE PO) 5 mg. Biscodyl    . cyanocobalamin (,VITAMIN B-12,) 1000 MCG/ML injection     . digoxin (LANOXIN) 0.125 MG tablet Take 1 tablet (0.125 mg total) by mouth daily. 90 tablet 3  . divalproex (DEPAKOTE ER) 500 MG 24 hr tablet Take 2 tablets (1,000 mg total) by mouth daily. 180 tablet 3  . donepezil (ARICEPT) 10 MG tablet Take 1 tablet (10 mg total) by mouth every morning. 90 tablet 3  . FLUoxetine (PROZAC) 20 MG capsule Take 1 capsule (20 mg total) by mouth daily. 90 capsule 3  . FLUZONE HIGH-DOSE 0.5 ML SUSY     . IBUPROFEN PO Take 1 tablet by mouth as needed.    . Isopropyl Alcohol (ALCOHOL PREP PADS EX)     . mirtazapine (REMERON) 15 MG tablet Take 1 tablet (15 mg total) by mouth at bedtime. 30 tablet 1  . nitroGLYCERIN (NITROSTAT) 0.4 MG SL tablet Place 1 tablet (0.4 mg total) under the tongue every 5 (five) minutes as needed for chest pain. 25 tablet prn  . Oral Thermometer MISC     . OVER THE COUNTER MEDICATION Triple antibiotic ointment Plus,As needed    . simvastatin (ZOCOR) 40 MG tablet TAKE 1 TABLET EVERY EVENING (NEED APPOINTMENT) 90 tablet 0  . tiZANidine (ZANAFLEX) 4 MG tablet As needed    . traMADol (ULTRAM) 50 MG tablet Take 1 tablet (50 mg total) by mouth every 8 (eight) hours as needed. 90 tablet 0  . traZODone (DESYREL) 100 MG tablet Take 2 tablets (200 mg total) by mouth at bedtime. 180 tablet 3  . [DISCONTINUED] sildenafil (VIAGRA) 50 MG tablet Take 1 tablet (50 mg total) by mouth as needed. 5 tablet 3   No current facility-administered medications for this visit.    Allergies  Allergen Reactions  . Codeine      Itch     Family History  Problem Relation Age of Onset  . Multiple myeloma Mother   . Diabetes Mother   . Cancer Sister     lymphoma  . Diabetes Sister   . Stroke Neg Hx     History   Social History  . Marital Status: Married    Spouse Name: Vaughan Basta  . Number of Children: 3  . Years of Education: 12   Occupational History  .      retired   Social History Main Topics  . Smoking status: Current Every Day Smoker -- 1.00 packs/day for 60 years  . Smokeless tobacco: Never Used  . Alcohol Use: No  . Drug Use:  No  . Sexual Activity: Not on file   Other Topics Concern  . Not on file   Social History Narrative   Patient is right handed   Patient resides with wife,consumes 2-3 cups of caffeine daily     Constitutional: Pt reports fatigue. Denies fever, malaise, headache or abrupt weight changes.  HEENT: Denies eye pain, eye redness, ear pain, ringing in the ears, wax buildup, runny nose, nasal congestion, bloody nose, or sore throat. Respiratory: Denies difficulty breathing, shortness of breath, cough or sputum production.   Cardiovascular: Denies chest pain, chest tightness, palpitations or swelling in the hands or feet.  Gastrointestinal: Denies abdominal pain, bloating, constipation, diarrhea or blood in the stool.  GU: Pt reports testicle pain. Denies urgency, frequency, pain with urination, burning sensation, blood in urine, odor or discharge. Musculoskeletal: Pt reports back pain. Denies muscle pain or joint pain and swelling.  Skin: Pt reports skin lesions on face. Denies redness, rashes, or ulcercations.  Neurological: Pt reports difficulty with memory and trouble with balance. Denies dizziness, difficulty with speech. Psych: Pt reports anxiety, depression, SI/HI.  No other specific complaints in a complete review of systems (except as listed in HPI above).  Objective:  BP 134/66 mmHg  Pulse 64  Temp(Src) 97.8 F (36.6 C) (Oral)  Ht 6' (1.829 m)  Wt 182 lb  (82.555 kg)  BMI 24.68 kg/m2  SpO2 98%  Wt Readings from Last 3 Encounters:  10/11/14 181 lb (82.101 kg)  04/11/14 167 lb (75.751 kg)  03/10/14 165 lb 8 oz (75.07 kg)    General: Appears his stated age, chronically ill appearing, in NAD. Skin: Warm, dry and intact. 2 distinct scaly lesions noted on forehead. Scattered seborrheic keratosis noted on back. HEENT: Head: normal shape and size; Eyes: sclera white, no icterus, conjunctiva pink, PERRLA and EOMs intact; Ears: Tm's gray and intact, normal light reflex, mild wax buildup; Throat/Mouth: Teeth absent, mucosa pink and moist, no exudate, lesions or ulcerations noted.  Neck: Neck supple, trachea midline. No massses, lumps or thyromegaly present.  Cardiovascular: Normal rate and rhythm. S1,S2 noted.  No murmur, rubs or gallops noted. No JVD or BLE edema. No carotid bruits noted. Pulmonary/Chest: Normal effort and coarse vesicular breath sounds. No respiratory distress. No wheezes, rales or ronchi noted.  GU: Normal male anatomy, uncircumcised. Testicles without swelling or mass. No tenderness with palpation. Abdomen: Soft and nontender. Normal bowel sounds, no bruits noted.  Musculoskeletal: Strength 5/5 BUE/BLE. Mildly ataxic gait.  Neurological: Alert and oriented. Coordination normal, but slow and with a slight tremor.    BMET    Component Value Date/Time   NA 141 10/11/2014 1122   NA 135 02/07/2014 1617   K 4.7 10/11/2014 1122   CL 101 10/11/2014 1122   CO2 27 10/11/2014 1122   GLUCOSE 86 10/11/2014 1122   GLUCOSE 86 02/07/2014 1617   BUN 11 10/11/2014 1122   BUN 9 02/07/2014 1617   CREATININE 0.82 10/11/2014 1122   CREATININE 0.72 03/26/2013 1608   CALCIUM 9.4 10/11/2014 1122   GFRNONAA 90 10/11/2014 1122   GFRAA 104 10/11/2014 1122    Lipid Panel     Component Value Date/Time   CHOL 157 10/26/2013 1406   TRIG 292.0* 10/26/2013 1406   HDL 32.70* 10/26/2013 1406   CHOLHDL 5 10/26/2013 1406   VLDL 58.4* 10/26/2013  1406   LDLCALC 66 10/26/2013 1406    CBC    Component Value Date/Time   WBC 6.2 10/11/2014 1122  WBC 7.1 02/07/2014 1617   RBC 4.40 10/11/2014 1122   RBC 4.18* 02/07/2014 1617   HGB 14.6 10/11/2014 1122   HCT 42.2 10/11/2014 1122   PLT 126* 10/11/2014 1122   MCV 96 10/11/2014 1122   MCH 33.2* 10/11/2014 1122   MCH 34.1* 04/14/2013 1450   MCHC 34.6 10/11/2014 1122   MCHC 34.6 02/07/2014 1617   RDW 13.5 10/11/2014 1122   RDW 13.6 02/07/2014 1617   LYMPHSABS 1.3 10/11/2014 1122   EOSABS 0.0 10/11/2014 1122   BASOSABS 0.0 10/11/2014 1122    Hgb A1C Lab Results  Component Value Date   HGBA1C 5.9 10/26/2013   Assessment and Plan:

## 2014-10-25 NOTE — Patient Instructions (Signed)

## 2014-10-25 NOTE — Assessment & Plan Note (Signed)
Is about to get a referral to neurosurgeon to discuss spinal cord stimulator

## 2014-10-25 NOTE — Assessment & Plan Note (Signed)
Seems slightly worse to me Continue current medications Will check CMET today Continue to follow with neurology

## 2014-10-25 NOTE — Assessment & Plan Note (Signed)
Will repeat lipid profile and CMET today Continue to consume a low fat diet

## 2014-10-25 NOTE — Assessment & Plan Note (Signed)
Controlled with trazadone

## 2014-10-25 NOTE — Assessment & Plan Note (Signed)
Mainly in back Continue ibuprofen and zanaflex

## 2014-10-25 NOTE — Assessment & Plan Note (Addendum)
Well controlled Continue current medications for now Continue to follow with Dr. Mare Ferrari Will check CBC and CMET today

## 2014-10-25 NOTE — Progress Notes (Signed)
Pre visit review using our clinic review tool, if applicable. No additional management support is needed unless otherwise documented below in the visit note. 

## 2014-11-01 ENCOUNTER — Other Ambulatory Visit: Payer: Self-pay | Admitting: Adult Health

## 2014-11-07 DIAGNOSIS — M546 Pain in thoracic spine: Secondary | ICD-10-CM | POA: Diagnosis not present

## 2014-11-07 DIAGNOSIS — M5416 Radiculopathy, lumbar region: Secondary | ICD-10-CM | POA: Diagnosis not present

## 2014-11-07 DIAGNOSIS — M25551 Pain in right hip: Secondary | ICD-10-CM | POA: Diagnosis not present

## 2014-11-07 DIAGNOSIS — M545 Low back pain: Secondary | ICD-10-CM | POA: Diagnosis not present

## 2014-11-10 ENCOUNTER — Other Ambulatory Visit: Payer: Self-pay | Admitting: Neurosurgery

## 2014-11-14 ENCOUNTER — Ambulatory Visit (INDEPENDENT_AMBULATORY_CARE_PROVIDER_SITE_OTHER): Payer: Medicare HMO | Admitting: Internal Medicine

## 2014-11-14 ENCOUNTER — Encounter: Payer: Self-pay | Admitting: Internal Medicine

## 2014-11-14 VITALS — BP 126/82 | HR 76 | Temp 97.9°F | Wt 177.0 lb

## 2014-11-14 DIAGNOSIS — J029 Acute pharyngitis, unspecified: Secondary | ICD-10-CM

## 2014-11-14 MED ORDER — LIDOCAINE VISCOUS 2 % MT SOLN: 20.0000 mL | OROMUCOSAL | Status: DC | PRN

## 2014-11-14 NOTE — Progress Notes (Signed)
Pre visit review using our clinic review tool, if applicable. No additional management support is needed unless otherwise documented below in the visit note. 

## 2014-11-14 NOTE — Patient Instructions (Signed)

## 2014-11-14 NOTE — Progress Notes (Signed)
Subjective:    Patient ID: Alexander Booth, male    DOB: 1946-04-12, 69 y.o.   MRN: [id number]  HPI  Pt presents to the clinic today with c/o sore throat and ear pressure. He reports this started 2 weeks ago. He also feels like his glands are swollen. He denies difficulty swallowing. He did go to the dentist right before his symptoms started, and had 8 teeth pulled. He was not sure if this is related, but at his follow up appt, his dentist advised him it was not related. He denies runny nose, nasal congestion, cough, fever, chills or body aches. He has not taken anything OTC.  Review of Systems      Past Medical History  Diagnosis Date  . Hypertension     Essential  . Hypercholesterolemia   . Lumbosacral disc disease     with chronic low back pain  . Bronchitis     Subacute and hx of ongoing cigarette smoking one half pack daily  . Diabetes mellitus     He's also had a hx of high cholesterol   . FHx: heart disease     Positive for   . Dyslipidemia     He also has a past hx of   . Type 2 diabetes mellitus 2010    takes oral meds  . Fracture of lumbar vertebra 11/25/2012  . Hard of hearing     very little hearing on left side;better in right ear  . Erectile dysfunction     He's had a hx of   . Depression   . Presenile dementia   . Dementia     He has a hx of the   . Peripheral neuropathy   . Peripheral neuropathy     He also has     Current Outpatient Prescriptions  Medication Sig Dispense Refill  . Bisacodyl (LAXATIVE PO) 5 mg. Biscodyl    . digoxin (LANOXIN) 0.125 MG tablet Take 1 tablet (0.125 mg total) by mouth daily. 90 tablet 3  . divalproex (DEPAKOTE ER) 500 MG 24 hr tablet Take 2 tablets (1,000 mg total) by mouth daily. 180 tablet 3  . donepezil (ARICEPT) 10 MG tablet Take 1 tablet (10 mg total) by mouth every morning. 90 tablet 3  . FLUoxetine (PROZAC) 20 MG capsule Take 1 capsule (20 mg total) by mouth daily. 90 capsule 3  . FLUZONE HIGH-DOSE 0.5 ML SUSY      . IBUPROFEN PO Take 1 tablet by mouth as needed.    . Isopropyl Alcohol (ALCOHOL PREP PADS EX)     . nitroGLYCERIN (NITROSTAT) 0.4 MG SL tablet Place 1 tablet (0.4 mg total) under the tongue every 5 (five) minutes as needed for chest pain. 25 tablet prn  . Oral Thermometer MISC     . OVER THE COUNTER MEDICATION Triple antibiotic ointment Plus,As needed    . simvastatin (ZOCOR) 40 MG tablet TAKE 1 TABLET EVERY EVENING (NEED APPOINTMENT) 30 tablet 0  . tiZANidine (ZANAFLEX) 4 MG tablet As needed    . traZODone (DESYREL) 100 MG tablet Take 2 tablets (200 mg total) by mouth at bedtime. 180 tablet 3  . [DISCONTINUED] sildenafil (VIAGRA) 50 MG tablet Take 1 tablet (50 mg total) by mouth as needed. 5 tablet 3   No current facility-administered medications for this visit.    Allergies  Allergen Reactions  . Codeine     Itch     Family History  Problem Relation Age of Onset  .  Multiple myeloma Mother   . Diabetes Mother   . Cancer Sister     lymphoma  . Diabetes Sister   . Stroke Neg Hx     History   Social History  . Marital Status: Married    Spouse Name: Vaughan Basta  . Number of Children: 3  . Years of Education: 12   Occupational History  .      retired   Social History Main Topics  . Smoking status: Current Every Day Smoker -- 1.00 packs/day for 60 years  . Smokeless tobacco: Never Used  . Alcohol Use: No  . Drug Use: No  . Sexual Activity: Not on file   Other Topics Concern  . Not on file   Social History Narrative   Patient is right handed   Patient resides with wife,consumes 2-3 cups of caffeine daily     Constitutional: Denies fever, malaise, fatigue, headache or abrupt weight changes.  HEENT: Pt reports ear fullness and sore throat. Denies eye pain, eye redness, ear pain, ringing in the ears, wax buildup, runny nose, nasal congestion, bloody nose. Respiratory: Denies difficulty breathing, shortness of breath, cough or sputum production.   Cardiovascular:  Denies chest pain, chest tightness, palpitations or swelling in the hands or feet.   No other specific complaints in a complete review of systems (except as listed in HPI above).  Objective:   Physical Exam  BP 126/82 mmHg  Pulse 76  Temp(Src) 97.9 F (36.6 C) (Oral)  Wt 177 lb (80.287 kg)  SpO2 98% Wt Readings from Last 3 Encounters:  11/14/14 177 lb (80.287 kg)  10/25/14 182 lb (82.555 kg)  10/11/14 181 lb (82.101 kg)    General: Appears his stated age, well developed, well nourished in NAD. HEENT: Head: normal shape and size; Eyes: sclera white, no icterus, conjunctiva pink, PERRLA and EOMs intact; Ears: Tm's gray and intact, normal light reflex, mild cerumen impaction; Throat/Mouth: mucosa mildly erythematous and moist, no exudate, lesions or ulcerations noted.  Neck: Adenopathy noted R>L. Cardiovascular: Normal rate and rhythm. S1,S2 noted.  No murmur, rubs or gallops noted.  Pulmonary/Chest: Normal effort and positive vesicular breath sounds. No respiratory distress. No wheezes, rales or ronchi noted.  Neurological: Alert and oriented.   BMET    Component Value Date/Time   NA 135 10/25/2014 1515   NA 141 10/11/2014 1122   K 4.5 10/25/2014 1515   CL 100 10/25/2014 1515   CO2 34* 10/25/2014 1515   GLUCOSE 170* 10/25/2014 1515   GLUCOSE 86 10/11/2014 1122   BUN 14 10/25/2014 1515   BUN 11 10/11/2014 1122   CREATININE 0.95 10/25/2014 1515   CREATININE 0.72 03/26/2013 1608   CALCIUM 9.7 10/25/2014 1515   GFRNONAA 90 10/11/2014 1122   GFRAA 104 10/11/2014 1122    Lipid Panel     Component Value Date/Time   CHOL 164 10/25/2014 1515   TRIG 226.0* 10/25/2014 1515   HDL 31.90* 10/25/2014 1515   CHOLHDL 5 10/25/2014 1515   VLDL 45.2* 10/25/2014 1515   LDLCALC 66 10/26/2013 1406    CBC    Component Value Date/Time   WBC 6.9 10/25/2014 1515   WBC 6.2 10/11/2014 1122   RBC 4.20* 10/25/2014 1515   RBC 4.40 10/11/2014 1122   HGB 14.1 10/25/2014 1515   HCT 40.5  10/25/2014 1515   PLT 135.0* 10/25/2014 1515   MCV 96.7 10/25/2014 1515   MCH 33.2* 10/11/2014 1122   MCH 34.1* 04/14/2013 1450   MCHC 34.8  10/25/2014 1515   MCHC 34.6 10/11/2014 1122   RDW 13.4 10/25/2014 1515   RDW 13.5 10/11/2014 1122   LYMPHSABS 1.3 10/11/2014 1122   EOSABS 0.0 10/11/2014 1122   BASOSABS 0.0 10/11/2014 1122    Hgb A1C Lab Results  Component Value Date   HGBA1C 5.8 10/25/2014         Assessment & Plan:   Sore throat:  No exudates or fever, will not do RST Does not appear to be allergies RX for viscous lidocaine He will monitor symptoms and let me know if no improvement by the end of the week Ibuprofen as needed for inflammation  RTC as needed or if symptoms persist or worsen

## 2014-11-22 NOTE — Pre-Procedure Instructions (Signed)
Austan Nicholl [facility]  11/22/2014   Your procedure is scheduled on:  Thursday, may 12.  Report to [facility] Admitting at 11:45 AM.   Call this number if you have problems the morning of surgery: [phone]               For any other questions, please call [phone], Monday - Friday 8 AM - 4 PM.   Remember:   Do not eat food or drink liquids after midnight Wednesday, May 11.   Take these medicines the morning of surgery with A SIP OF WATER: digoxin (LANOXIN),  divalproex (DEPAKOTE ER),  donepezil (ARICEPT), FLUoxetine (PROZAC).               Take if needed:tiZANidine (ZANAFLEX), nitroGLYCERIN (NITROSTAT).               Stop taking Vitamins, Ibuprofen.   Do not wear jewelry, make-up or nail polish.  Do not wear lotions, powders, or perfumes.   Men may shave face and neck.  Do not bring valuables to the hospital.  [facility] is not responsible for any belongings or valuables.               Contacts, dentures or bridgework may not be worn into surgery.  Leave suitcase in the car. After surgery it may be brought to your room.  For patients admitted to the hospital, discharge time is determined by your treatment team.               Patients discharged the day of surgery will not be allowed to drive home.  Name and phone number of your driver: -   Special Instructions: Review  [place] - Preparing For Surgery.   Please read over the following fact sheets that you were given: Pain Booklet, Coughing and Deep Breathing and Surgical Site Infection Prevention

## 2014-11-23 ENCOUNTER — Encounter (HOSPITAL_COMMUNITY): Payer: Self-pay

## 2014-11-23 ENCOUNTER — Encounter (HOSPITAL_COMMUNITY)
Admission: RE | Admit: 2014-11-23 | Discharge: 2014-11-23 | Disposition: A | Discharge status: Home / Self Care | Payer: Commercial Managed Care - HMO | Source: Ambulatory Visit | Attending: Neurosurgery | Admitting: Neurosurgery

## 2014-11-23 DIAGNOSIS — Z9889 Other specified postprocedural states: Secondary | ICD-10-CM | POA: Diagnosis not present

## 2014-11-23 DIAGNOSIS — Z981 Arthrodesis status: Secondary | ICD-10-CM | POA: Diagnosis not present

## 2014-11-23 DIAGNOSIS — M5414 Radiculopathy, thoracic region: Secondary | ICD-10-CM | POA: Diagnosis not present

## 2014-11-23 DIAGNOSIS — I119 Hypertensive heart disease without heart failure: Secondary | ICD-10-CM | POA: Diagnosis not present

## 2014-11-23 DIAGNOSIS — F0390 Unspecified dementia without behavioral disturbance: Secondary | ICD-10-CM | POA: Diagnosis not present

## 2014-11-23 DIAGNOSIS — E780 Pure hypercholesterolemia: Secondary | ICD-10-CM | POA: Diagnosis not present

## 2014-11-23 DIAGNOSIS — M549 Dorsalgia, unspecified: Secondary | ICD-10-CM | POA: Diagnosis present

## 2014-11-23 DIAGNOSIS — M5416 Radiculopathy, lumbar region: Secondary | ICD-10-CM | POA: Diagnosis not present

## 2014-11-23 DIAGNOSIS — G8929 Other chronic pain: Secondary | ICD-10-CM | POA: Diagnosis not present

## 2014-11-23 DIAGNOSIS — Z885 Allergy status to narcotic agent status: Secondary | ICD-10-CM | POA: Diagnosis not present

## 2014-11-23 LAB — CBC
HCT: 40.9 % (ref 39.0–52.0)
Hemoglobin: 13.7 g/dL (ref 13.0–17.0)
MCH: 33 pg (ref 26.0–34.0)
MCHC: 33.5 g/dL (ref 30.0–36.0)
MCV: 98.6 fL (ref 78.0–100.0)
Platelets: 147 10*3/uL — ABNORMAL LOW (ref 150–400)
RBC: 4.15 MIL/uL — ABNORMAL LOW (ref 4.22–5.81)
RDW: 12.4 % (ref 11.5–15.5)
WBC: 6.1 10*3/uL (ref 4.0–10.5)

## 2014-11-23 LAB — SURGICAL PCR SCREEN
MRSA, PCR: NEGATIVE
Staphylococcus aureus: NEGATIVE

## 2014-11-23 LAB — BASIC METABOLIC PANEL
ANION GAP: 8 (ref 5–15)
BUN: 8 mg/dL (ref 6–20)
CO2: 29 mmol/L (ref 22–32)
Calcium: 9.3 mg/dL (ref 8.9–10.3)
Chloride: 102 mmol/L (ref 101–111)
Creatinine, Ser: 0.92 mg/dL (ref 0.61–1.24)
GFR calc non Af Amer: >60 mL/min (ref >60)
Glucose, Bld: 162 mg/dL — ABNORMAL HIGH (ref 70–99)
Potassium: 4.1 mmol/L (ref 3.5–5.1)
Sodium: 139 mmol/L (ref 135–145)

## 2014-11-23 NOTE — Progress Notes (Signed)
Alexander Booth has history of DM, not on medication, does not check CBG's, last A1C was 5.8 drawn 45/16.  Alexander Booth reports that he has experienced some dull chest pain, "not in a while" (could not narrow down 'a while'). Patient points to mid sternum.  Reports that pain last 10- 15 mins, does not experience shortness of breath or diaphoresis.  (has history of lightheadedness).  Patient does not notice any thing that he is doing when pain begins. Patient does not take NTG or any medication for the pain

## 2014-11-24 NOTE — Progress Notes (Addendum)
Anesthesia Chart Review:  Pt is 69 year old male scheduled for lumbar spinal cord stimulator with laminectomy on 12/01/2014 with Dr. Vertell Limber.   Cardiologist is Dr. Mare Ferrari, last office visit 11/15/2013.   PMH includes: HTN, DM, dyslipidemia, dementia (followed by neurology), hard of hearing. Current smoker. BMI 24. S/p PLIF 01/18/13. S/p L1 kyphoplasty 12/10/12.   Medications include: digoxin, depakote, aricept.   Preoperative labs reviewed.    Chest x-ray 02/07/2014 reviewed. Stable. Emphysema without acute cardiopulmonary findings.   EKG: Sinus bradycardia with PACs.   Pt reported chest pain in PAT but was vague about when last sx occurred. Discussed with Dr. Linna Caprice. Pt will need cardiac clearance prior to surgery. Notified Jessica in Dr. Melven Sartorius office.   Willeen Cass, FNP-BC [facility]/Anesthesiology Phone: [phone] 11/24/2014 4:56 PM  Addendum: Patient seen by Dr. Mare Ferrari on 12/01/14 for preoperative evaluation. He cleared patient for planned procedure.  George Hugh [facility]/Anesthesiology Phone [phone] 12/01/2014 10:52 AM

## 2014-11-29 ENCOUNTER — Other Ambulatory Visit: Payer: Self-pay | Admitting: Cardiology

## 2014-11-30 ENCOUNTER — Telehealth: Payer: Self-pay | Admitting: *Deleted

## 2014-11-30 NOTE — Telephone Encounter (Signed)
Received surgical clearance from Neurosurgery [facility] Spine, Dr Vertell Limber  Dr. Mare Ferrari reviewed information and patient needs to be seen prior  Scheduled ov for 12/01/14 am, daughter aware

## 2014-12-01 ENCOUNTER — Ambulatory Visit (INDEPENDENT_AMBULATORY_CARE_PROVIDER_SITE_OTHER): Payer: Commercial Managed Care - HMO | Admitting: Cardiology

## 2014-12-01 ENCOUNTER — Encounter: Payer: Self-pay | Admitting: Cardiology

## 2014-12-01 VITALS — BP 144/78 | HR 52 | Ht 73.0 in | Wt 179.8 lb

## 2014-12-01 DIAGNOSIS — E7800 Pure hypercholesterolemia, unspecified: Secondary | ICD-10-CM

## 2014-12-01 DIAGNOSIS — F0390 Unspecified dementia without behavioral disturbance: Secondary | ICD-10-CM

## 2014-12-01 DIAGNOSIS — I119 Hypertensive heart disease without heart failure: Secondary | ICD-10-CM

## 2014-12-01 MED ORDER — CEFAZOLIN SODIUM-DEXTROSE 2-3 GM-% IV SOLR
2.0000 g | INTRAVENOUS | Status: AC
  Administered 2014-12-02: 2 g via INTRAVENOUS
  Filled 2014-12-01: qty 50

## 2014-12-01 NOTE — Patient Instructions (Signed)
Medication Instructions:  Your physician recommends that you continue on your current medications as directed. Please refer to the Current Medication list given to you today.  Labwork: none  Testing/Procedures: none  Follow-Up: Your physician wants you to follow-up in: 6 month ov You will receive a reminder letter in the mail two months in advance. If you don't receive a letter, please call our office to schedule the follow-up appointment.   Any Other Special Instructions Will Be Listed Below (If Applicable). YOU ARE CLEARED FROM PROCEDURE TOMORROW

## 2014-12-01 NOTE — Progress Notes (Signed)
Cardiology Office Note   Date:  12/01/2014   ID:  [name], [name] [birth year], MRN [id number]  PCP:  Webb Silversmith, NP  Cardiologist: Darlin Coco MD  No chief complaint on file.     History of Present Illness: Alexander Booth is a 69 y.o. male who presents for preoperative clearance.  He is scheduled to have a spinal cord stimulator implant tomorrow by Dr. Vertell Limber.  This pleasant 69 year old gentleman has a history of hypertension and hypercholesterolemia. The patient also has a history of dementia and a history of diabetes mellitus. His last visit he has not had any new cardiac symptoms. He denies any chest pain or shortness of breath.  He has a history of COPD.  He continues to smoke a pack of cigarettes a day.  Past Medical History  Diagnosis Date  . Hypertension     Essential  . Hypercholesterolemia   . Lumbosacral disc disease     with chronic low back pain  . Bronchitis     Subacute and hx of ongoing cigarette smoking one half pack daily  . Diabetes mellitus     He's also had a hx of high cholesterol   . FHx: heart disease     Positive for   . Dyslipidemia     He also has a past hx of   . Type 2 diabetes mellitus 2010    takes oral meds  . Fracture of lumbar vertebra 11/25/2012  . Hard of hearing     very little hearing on left side;better in right ear  . Erectile dysfunction     He's had a hx of   . Depression   . Presenile dementia   . Dementia     He has a hx of the   . Peripheral neuropathy   . Peripheral neuropathy     He also has   . Numbness and tingling in hands     and feet  . Anginal pain     Past Surgical History  Procedure Laterality Date  . Knee surgery      Previous left total replacement  . Hemorrhoid surgery      x2  . Other surgical history  1992    Bone Fusion in Right Foot  . Anterior cervical decomp/discectomy fusion    . Appendectomy    . Joint replacement Left     2nd one was to change siZe  . Total knee  arthroplasty Left   . Kyphoplasty N/A 12/10/2012    Procedure: Lumbar one Kyphoplasty;  Surgeon: Kristeen Miss, MD;  Location: [place] [facility];  Service: Neurosurgery;  Laterality: N/A;  Lumbar One Kyphoplasty     Current Outpatient Prescriptions  Medication Sig Dispense Refill  . Bisacodyl (LAXATIVE PO) Take 5 mg by mouth daily as needed (constipation). Biscodyl    . digoxin (LANOXIN) 0.125 MG tablet Take 1 tablet (0.125 mg total) by mouth daily. 90 tablet 3  . divalproex (DEPAKOTE ER) 500 MG 24 hr tablet Take 2 tablets (1,000 mg total) by mouth daily. 180 tablet 3  . donepezil (ARICEPT) 10 MG tablet Take 1 tablet (10 mg total) by mouth every morning. 90 tablet 3  . FLUoxetine (PROZAC) 20 MG capsule Take 1 capsule (20 mg total) by mouth daily. 90 capsule 3  . HYDROcodone-acetaminophen (NORCO) 7.5-325 MG per tablet Take 1 tablet by mouth every 6 (six) hours as needed for moderate pain.    Marland Kitchen neomycin-bacitracin-polymyxin (NEOSPORIN) [phone] ointment Apply 1  application topically as needed (wound).    . nitroGLYCERIN (NITROSTAT) 0.4 MG SL tablet Place 1 tablet (0.4 mg total) under the tongue every 5 (five) minutes as needed for chest pain. 25 tablet prn  . simvastatin (ZOCOR) 40 MG tablet Take 1 tablet (40 mg total) by mouth daily at 6 PM. 30 tablet 6  . tiZANidine (ZANAFLEX) 4 MG tablet Take 4 mg by mouth every 6 (six) hours as needed for muscle spasms. As needed    . traZODone (DESYREL) 100 MG tablet Take 2 tablets (200 mg total) by mouth at bedtime. 180 tablet 3  . [DISCONTINUED] sildenafil (VIAGRA) 50 MG tablet Take 1 tablet (50 mg total) by mouth as needed. 5 tablet 3   No current facility-administered medications for this visit.    Allergies:   Codeine    Social History:  The patient  reports that he has been smoking.  He has never used smokeless tobacco. He reports that he does not drink alcohol or use illicit drugs.   Family History:  The patient's family history includes Cancer in  his sister; Diabetes in his mother and sister; Multiple myeloma in his mother. There is no history of Stroke.    ROS:  Please see the history of present illness.   Otherwise, review of systems are positive for none.   All other systems are reviewed and negative.    PHYSICAL EXAM: VS:  BP 144/78 mmHg  Pulse 52  Ht '6\' 1"'  (1.854 m)  Wt 179 lb 12.8 oz (81.557 kg)  BMI 23.73 kg/m2 , BMI Body mass index is 23.73 kg/(m^2). GEN: Well nourished, well developed, in no acute distress HEENT: normal Neck: no JVD, carotid bruits, or masses Cardiac: RRR; no murmurs, rubs, or gallops,no edema  Respiratory:  clear to auscultation bilaterally, normal work of breathing GI: soft, nontender, nondistended, + BS MS: no deformity or atrophy Skin: warm and dry, no rash Neuro:  Strength and sensation are intact Psych: euthymic mood, full affect   EKG:  EKG is ordered today. The ekg ordered today demonstrates sinus bradycardia.  Heart rate 52 bpm.  Otherwise normal EKG.  No ischemic changes.   Recent Labs: 03/10/2014: TSH 0.54 10/25/2014: ALT 11 11/23/2014: BUN 8; Creatinine 0.92; Hemoglobin 13.7; Platelets 147*; Potassium 4.1; Sodium 139    Lipid Panel    Component Value Date/Time   CHOL 164 10/25/2014 1515   TRIG 226.0* 10/25/2014 1515   HDL 31.90* 10/25/2014 1515   CHOLHDL 5 10/25/2014 1515   VLDL 45.2* 10/25/2014 1515   LDLCALC 66 10/26/2013 1406   LDLDIRECT 85.0 10/25/2014 1515      Wt Readings from Last 3 Encounters:  12/01/14 179 lb 12.8 oz (81.557 kg)  11/14/14 177 lb (80.287 kg)  10/25/14 182 lb (82.555 kg)        ASSESSMENT AND PLAN:  1.  Essential hypertension 2.  Hypercholesterolemia followed by his PCP 3.  COPD and ongoing tobacco abuse 4.  Dementia 5.  Intractable low back pain  Plan: The patient is cleared for lumbar spinal cord stimulator insertion via laminectomy on 12/01/14 by Dr. Vertell Limber. The patient will continue current medication.  He was again urged to quit  smoking cigarettes.  We looked at his recent chest x-ray dated 67/20/15 together.  It does show a normal heart size with hyperinflation of the lungs consistent with COPD.   Current medicines are reviewed at length with the patient today.  The patient does not have concerns regarding medicines.  The  following changes have been made:  no change  Labs/ tests ordered today include: None   Orders Placed This Encounter  Procedures  . EKG 12-Lead     Plan: Proceed with surgery.  Recheck here in 6 months for follow-up office visit.  Berna Spare MD 12/01/2014 10:34 AM    [address], [place], [place]  [address] Phone: [phone]; Fax: [phone]

## 2014-12-02 ENCOUNTER — Encounter (HOSPITAL_COMMUNITY)
Admission: RE | Disposition: A | Discharge status: Home / Self Care | Payer: Self-pay | Source: Ambulatory Visit | Attending: Neurosurgery

## 2014-12-02 ENCOUNTER — Ambulatory Visit (HOSPITAL_COMMUNITY): Payer: Commercial Managed Care - HMO | Admitting: Certified Registered Nurse Anesthetist

## 2014-12-02 ENCOUNTER — Encounter (HOSPITAL_COMMUNITY): Payer: Self-pay | Admitting: *Deleted

## 2014-12-02 ENCOUNTER — Ambulatory Visit (HOSPITAL_COMMUNITY): Payer: Commercial Managed Care - HMO | Admitting: Emergency Medicine

## 2014-12-02 ENCOUNTER — Inpatient Hospital Stay (HOSPITAL_COMMUNITY)
Admission: RE | Admit: 2014-12-02 | Discharge: 2014-12-03 | DRG: 518 | Disposition: A | Discharge status: Home / Self Care | Payer: Commercial Managed Care - HMO | Source: Ambulatory Visit | Attending: Neurosurgery | Admitting: Neurosurgery

## 2014-12-02 ENCOUNTER — Ambulatory Visit (HOSPITAL_COMMUNITY): Payer: Commercial Managed Care - HMO

## 2014-12-02 DIAGNOSIS — Z981 Arthrodesis status: Secondary | ICD-10-CM

## 2014-12-02 DIAGNOSIS — G8929 Other chronic pain: Secondary | ICD-10-CM | POA: Diagnosis present

## 2014-12-02 DIAGNOSIS — M5416 Radiculopathy, lumbar region: Secondary | ICD-10-CM | POA: Diagnosis present

## 2014-12-02 DIAGNOSIS — Z885 Allergy status to narcotic agent status: Secondary | ICD-10-CM

## 2014-12-02 DIAGNOSIS — M549 Dorsalgia, unspecified: Secondary | ICD-10-CM | POA: Diagnosis present

## 2014-12-02 DIAGNOSIS — Z9889 Other specified postprocedural states: Secondary | ICD-10-CM | POA: Diagnosis not present

## 2014-12-02 HISTORY — PX: SPINAL CORD STIMULATOR INSERTION: SHX5378

## 2014-12-02 LAB — GLUCOSE, CAPILLARY: Glucose-Capillary: 98 mg/dL (ref 65–99)

## 2014-12-02 SURGERY — INSERTION, SPINAL CORD STIMULATOR, LUMBAR
Anesthesia: General | Site: Spine Thoracic

## 2014-12-02 MED ORDER — HYDROMORPHONE HCL 1 MG/ML IJ SOLN
INTRAMUSCULAR | Status: AC
  Filled 2014-12-02: qty 1

## 2014-12-02 MED ORDER — ROCURONIUM BROMIDE 100 MG/10ML IV SOLN
INTRAVENOUS | Status: DC | PRN
  Administered 2014-12-02: 40 mg via INTRAVENOUS

## 2014-12-02 MED ORDER — BISACODYL 10 MG RE SUPP: 10.0000 mg | Freq: Every day | RECTAL | Status: DC | PRN

## 2014-12-02 MED ORDER — DOCUSATE SODIUM 100 MG PO CAPS
100.0000 mg | ORAL_CAPSULE | Freq: Two times a day (BID) | ORAL | Status: DC
  Administered 2014-12-02: 100 mg via ORAL
  Filled 2014-12-02: qty 1

## 2014-12-02 MED ORDER — ONDANSETRON HCL 4 MG/2ML IJ SOLN
INTRAMUSCULAR | Status: DC | PRN
  Administered 2014-12-02: 4 mg via INTRAVENOUS

## 2014-12-02 MED ORDER — THROMBIN 5000 UNITS EX SOLR
CUTANEOUS | Status: DC | PRN
  Administered 2014-12-02 (×2): 5000 [IU] via TOPICAL

## 2014-12-02 MED ORDER — METHOCARBAMOL 500 MG PO TABS
500.0000 mg | ORAL_TABLET | Freq: Four times a day (QID) | ORAL | Status: DC | PRN
  Administered 2014-12-02: 500 mg via ORAL

## 2014-12-02 MED ORDER — HEMOSTATIC AGENTS (NO CHARGE) OPTIME
TOPICAL | Status: DC | PRN
  Administered 2014-12-02: 1 via TOPICAL

## 2014-12-02 MED ORDER — ONDANSETRON HCL 4 MG/2ML IJ SOLN
INTRAMUSCULAR | Status: AC
  Filled 2014-12-02: qty 2

## 2014-12-02 MED ORDER — PROPOFOL 10 MG/ML IV BOLUS
INTRAVENOUS | Status: DC | PRN
  Administered 2014-12-02: 100 mg via INTRAVENOUS

## 2014-12-02 MED ORDER — EPHEDRINE SULFATE 50 MG/ML IJ SOLN
INTRAMUSCULAR | Status: DC | PRN
  Administered 2014-12-02 (×2): 5 mg via INTRAVENOUS

## 2014-12-02 MED ORDER — MEPERIDINE HCL 25 MG/ML IJ SOLN: 6.2500 mg | INTRAMUSCULAR | Status: DC | PRN

## 2014-12-02 MED ORDER — LIDOCAINE HCL (CARDIAC) 20 MG/ML IV SOLN
INTRAVENOUS | Status: AC
  Filled 2014-12-02: qty 5

## 2014-12-02 MED ORDER — FLUOXETINE HCL 20 MG PO CAPS
20.0000 mg | ORAL_CAPSULE | Freq: Every day | ORAL | Status: DC
  Administered 2014-12-02: 20 mg via ORAL
  Filled 2014-12-02: qty 1

## 2014-12-02 MED ORDER — ACETAMINOPHEN 325 MG PO TABS: 650.0000 mg | ORAL_TABLET | ORAL | Status: DC | PRN

## 2014-12-02 MED ORDER — ZOLPIDEM TARTRATE 5 MG PO TABS
5.0000 mg | ORAL_TABLET | Freq: Every evening | ORAL | Status: DC | PRN
  Administered 2014-12-03: 5 mg via ORAL
  Filled 2014-12-02: qty 1

## 2014-12-02 MED ORDER — HYDROMORPHONE HCL 1 MG/ML IJ SOLN: 0.2500 mg | INTRAMUSCULAR | Status: DC | PRN

## 2014-12-02 MED ORDER — PANTOPRAZOLE SODIUM 40 MG IV SOLR
40.0000 mg | Freq: Every day | INTRAVENOUS | Status: DC
  Administered 2014-12-02: 40 mg via INTRAVENOUS
  Filled 2014-12-02: qty 40

## 2014-12-02 MED ORDER — FENTANYL CITRATE (PF) 250 MCG/5ML IJ SOLN
INTRAMUSCULAR | Status: AC
  Filled 2014-12-02: qty 5

## 2014-12-02 MED ORDER — LACTATED RINGERS IV SOLN
INTRAVENOUS | Status: DC
  Administered 2014-12-02: 12:00:00 via INTRAVENOUS

## 2014-12-02 MED ORDER — BISACODYL 5 MG PO TBEC: 5.0000 mg | DELAYED_RELEASE_TABLET | Freq: Every day | ORAL | Status: DC | PRN

## 2014-12-02 MED ORDER — ROCURONIUM BROMIDE 50 MG/5ML IV SOLN
INTRAVENOUS | Status: AC
  Filled 2014-12-02: qty 1

## 2014-12-02 MED ORDER — CEFAZOLIN SODIUM 1-5 GM-% IV SOLN
1.0000 g | Freq: Three times a day (TID) | INTRAVENOUS | Status: AC
  Administered 2014-12-02 – 2014-12-03 (×2): 1 g via INTRAVENOUS
  Filled 2014-12-02 (×2): qty 50

## 2014-12-02 MED ORDER — FENTANYL CITRATE (PF) 100 MCG/2ML IJ SOLN
INTRAMUSCULAR | Status: DC | PRN
  Administered 2014-12-02: 50 ug via INTRAVENOUS
  Administered 2014-12-02: 150 ug via INTRAVENOUS

## 2014-12-02 MED ORDER — ACETAMINOPHEN 650 MG RE SUPP: 650.0000 mg | RECTAL | Status: DC | PRN

## 2014-12-02 MED ORDER — PROPOFOL 10 MG/ML IV BOLUS
INTRAVENOUS | Status: AC
  Filled 2014-12-02: qty 20

## 2014-12-02 MED ORDER — METHOCARBAMOL 500 MG PO TABS
ORAL_TABLET | ORAL | Status: AC
  Filled 2014-12-02: qty 1

## 2014-12-02 MED ORDER — GLYCOPYRROLATE 0.2 MG/ML IJ SOLN
INTRAMUSCULAR | Status: DC | PRN
  Administered 2014-12-02: 0.2 mg via INTRAVENOUS
  Administered 2014-12-02: 0.4 mg via INTRAVENOUS

## 2014-12-02 MED ORDER — 0.9 % SODIUM CHLORIDE (POUR BTL) OPTIME
TOPICAL | Status: DC | PRN
  Administered 2014-12-02: 1000 mL

## 2014-12-02 MED ORDER — ARTIFICIAL TEARS OP OINT
TOPICAL_OINTMENT | OPHTHALMIC | Status: DC | PRN
Start: 2014-12-02 — End: 2014-12-02
  Administered 2014-12-02: 1 via OPHTHALMIC

## 2014-12-02 MED ORDER — TRAZODONE HCL 100 MG PO TABS
200.0000 mg | ORAL_TABLET | Freq: Every day | ORAL | Status: DC
Start: 2014-12-02 — End: 2014-12-03
  Administered 2014-12-02: 200 mg via ORAL
  Filled 2014-12-02: qty 2

## 2014-12-02 MED ORDER — NITROGLYCERIN 0.4 MG SL SUBL: 0.4000 mg | SUBLINGUAL_TABLET | SUBLINGUAL | Status: DC | PRN

## 2014-12-02 MED ORDER — GLYCOPYRROLATE 0.2 MG/ML IJ SOLN
INTRAMUSCULAR | Status: AC
  Filled 2014-12-02: qty 2

## 2014-12-02 MED ORDER — HYDROCODONE-ACETAMINOPHEN 7.5-325 MG PO TABS: 1.0000 | ORAL_TABLET | Freq: Four times a day (QID) | ORAL | Status: DC | PRN

## 2014-12-02 MED ORDER — NEOSTIGMINE METHYLSULFATE 10 MG/10ML IV SOLN
INTRAVENOUS | Status: AC
  Filled 2014-12-02: qty 1

## 2014-12-02 MED ORDER — BUPIVACAINE HCL (PF) 0.5 % IJ SOLN
INTRAMUSCULAR | Status: DC | PRN
  Administered 2014-12-02: 5 mL

## 2014-12-02 MED ORDER — DONEPEZIL HCL 10 MG PO TABS: 10.0000 mg | ORAL_TABLET | Freq: Every morning | ORAL | Status: DC

## 2014-12-02 MED ORDER — DEXTROSE 5 % IV SOLN
500.0000 mg | Freq: Four times a day (QID) | INTRAVENOUS | Status: DC | PRN
  Filled 2014-12-02: qty 5

## 2014-12-02 MED ORDER — HYDROMORPHONE HCL 1 MG/ML IJ SOLN
0.5000 mg | INTRAMUSCULAR | Status: DC | PRN
  Filled 2014-12-02: qty 1

## 2014-12-02 MED ORDER — KCL IN DEXTROSE-NACL 20-5-0.45 MEQ/L-%-% IV SOLN
INTRAVENOUS | Status: DC
  Administered 2014-12-02: 19:00:00 via INTRAVENOUS
  Filled 2014-12-02: qty 1000

## 2014-12-02 MED ORDER — EPHEDRINE SULFATE 50 MG/ML IJ SOLN
INTRAMUSCULAR | Status: AC
  Filled 2014-12-02: qty 1

## 2014-12-02 MED ORDER — OXYCODONE-ACETAMINOPHEN 5-325 MG PO TABS
1.0000 | ORAL_TABLET | ORAL | Status: DC | PRN
  Administered 2014-12-02: 2 via ORAL

## 2014-12-02 MED ORDER — LACTATED RINGERS IV SOLN
INTRAVENOUS | Status: DC | PRN
  Administered 2014-12-02 (×2): via INTRAVENOUS

## 2014-12-02 MED ORDER — MIDAZOLAM HCL 2 MG/2ML IJ SOLN: 0.5000 mg | Freq: Once | INTRAMUSCULAR | Status: DC | PRN

## 2014-12-02 MED ORDER — MENTHOL 3 MG MT LOZG: 1.0000 | LOZENGE | OROMUCOSAL | Status: DC | PRN

## 2014-12-02 MED ORDER — SODIUM CHLORIDE 0.9 % IV SOLN: 250.0000 mL | INTRAVENOUS | Status: DC

## 2014-12-02 MED ORDER — TIZANIDINE HCL 4 MG PO TABS
4.0000 mg | ORAL_TABLET | Freq: Four times a day (QID) | ORAL | Status: DC | PRN
  Filled 2014-12-02: qty 1

## 2014-12-02 MED ORDER — DIGOXIN 125 MCG PO TABS: 0.1250 mg | ORAL_TABLET | Freq: Every day | ORAL | Status: DC

## 2014-12-02 MED ORDER — SODIUM CHLORIDE 0.9 % IJ SOLN: 3.0000 mL | Freq: Two times a day (BID) | INTRAMUSCULAR | Status: DC

## 2014-12-02 MED ORDER — FLEET ENEMA 7-19 GM/118ML RE ENEM: 1.0000 | ENEMA | Freq: Once | RECTAL | Status: AC | PRN

## 2014-12-02 MED ORDER — DIVALPROEX SODIUM ER 500 MG PO TB24
1000.0000 mg | ORAL_TABLET | Freq: Every day | ORAL | Status: DC
  Administered 2014-12-02: 1000 mg via ORAL
  Filled 2014-12-02 (×3): qty 2

## 2014-12-02 MED ORDER — ALUM & MAG HYDROXIDE-SIMETH 200-200-20 MG/5ML PO SUSP
30.0000 mL | Freq: Four times a day (QID) | ORAL | Status: DC | PRN
Start: 2014-12-02 — End: 2014-12-03

## 2014-12-02 MED ORDER — NEOSTIGMINE METHYLSULFATE 10 MG/10ML IV SOLN
INTRAVENOUS | Status: DC | PRN
  Administered 2014-12-02: 3 mg via INTRAVENOUS

## 2014-12-02 MED ORDER — ONDANSETRON HCL 4 MG/2ML IJ SOLN: 4.0000 mg | INTRAMUSCULAR | Status: DC | PRN

## 2014-12-02 MED ORDER — OXYCODONE-ACETAMINOPHEN 5-325 MG PO TABS
ORAL_TABLET | ORAL | Status: AC
  Filled 2014-12-02: qty 2

## 2014-12-02 MED ORDER — SODIUM CHLORIDE 0.9 % IJ SOLN: 3.0000 mL | INTRAMUSCULAR | Status: DC | PRN

## 2014-12-02 MED ORDER — HYDROCODONE-ACETAMINOPHEN 5-325 MG PO TABS
1.0000 | ORAL_TABLET | ORAL | Status: DC | PRN
  Administered 2014-12-02 – 2014-12-03 (×3): 2 via ORAL
  Filled 2014-12-02 (×3): qty 2

## 2014-12-02 MED ORDER — GLYCOPYRROLATE 0.2 MG/ML IJ SOLN
INTRAMUSCULAR | Status: AC
  Filled 2014-12-02: qty 1

## 2014-12-02 MED ORDER — SODIUM CHLORIDE 0.9 % IJ SOLN
INTRAMUSCULAR | Status: AC
  Filled 2014-12-02: qty 10

## 2014-12-02 MED ORDER — PROMETHAZINE HCL 25 MG/ML IJ SOLN: 6.2500 mg | INTRAMUSCULAR | Status: DC | PRN

## 2014-12-02 MED ORDER — LIDOCAINE HCL (CARDIAC) 20 MG/ML IV SOLN
INTRAVENOUS | Status: DC | PRN
  Administered 2014-12-02: 20 mg via INTRAVENOUS

## 2014-12-02 MED ORDER — PHENOL 1.4 % MT LIQD: 1.0000 | OROMUCOSAL | Status: DC | PRN

## 2014-12-02 MED ORDER — SIMVASTATIN 40 MG PO TABS
40.0000 mg | ORAL_TABLET | Freq: Every day | ORAL | Status: DC
  Administered 2014-12-02: 40 mg via ORAL
  Filled 2014-12-02: qty 1

## 2014-12-02 MED ORDER — STERILE WATER FOR INJECTION IJ SOLN
INTRAMUSCULAR | Status: AC
  Filled 2014-12-02: qty 20

## 2014-12-02 MED ORDER — LIDOCAINE-EPINEPHRINE 1 %-1:100000 IJ SOLN
INTRAMUSCULAR | Status: DC | PRN
  Administered 2014-12-02: 5 mL

## 2014-12-02 MED ORDER — POLYETHYLENE GLYCOL 3350 17 G PO PACK: 17.0000 g | PACK | Freq: Every day | ORAL | Status: DC | PRN

## 2014-12-02 SURGICAL SUPPLY — 66 items
ADH SKN CLS APL DERMABOND .7 (GAUZE/BANDAGES/DRESSINGS) ×2
APL SKNCLS STERI-STRIP NONHPOA (GAUZE/BANDAGES/DRESSINGS)
BENZOIN TINCTURE PRP APPL 2/3 (GAUZE/BANDAGES/DRESSINGS) IMPLANT
BLADE CLIPPER SURG (BLADE) IMPLANT
BRUSH SCRUB EZ PLAIN DRY (MISCELLANEOUS) ×2 IMPLANT
BUR MATCHSTICK NEURO 3.0 LAGG (BURR) IMPLANT
BUR PRECISION FLUTE 5.0 (BURR) ×2 IMPLANT
CONT SPEC 4OZ CLIKSEAL STRL BL (MISCELLANEOUS) ×2 IMPLANT
DECANTER SPIKE VIAL GLASS SM (MISCELLANEOUS) ×2 IMPLANT
DERMABOND ADVANCED (GAUZE/BANDAGES/DRESSINGS) ×2
DERMABOND ADVANCED .7 DNX12 (GAUZE/BANDAGES/DRESSINGS) IMPLANT
DRAPE C-ARM 42X72 X-RAY (DRAPES) ×2 IMPLANT
DRAPE INCISE IOBAN 85X60 (DRAPES) IMPLANT
DRAPE LAPAROTOMY 100X72X124 (DRAPES) ×2 IMPLANT
DRAPE POUCH INSTRU U-SHP 10X18 (DRAPES) ×2 IMPLANT
DRAPE SURG 17X23 STRL (DRAPES) ×2 IMPLANT
DRSG OPSITE POSTOP 4X6 (GAUZE/BANDAGES/DRESSINGS) ×2 IMPLANT
DRSG TELFA 3X8 NADH (GAUZE/BANDAGES/DRESSINGS) ×2 IMPLANT
ELECT REM PT RETURN 9FT ADLT (ELECTROSURGICAL) ×2
ELECTRODE REM PT RTRN 9FT ADLT (ELECTROSURGICAL) ×1 IMPLANT
ELEVATER PASSER (SPINAL CORD STIMULATOR) ×2
GAUZE SPONGE 4X4 16PLY XRAY LF (GAUZE/BANDAGES/DRESSINGS) IMPLANT
GLOVE BIO SURGEON STRL SZ8 (GLOVE) ×2 IMPLANT
GLOVE BIOGEL PI IND STRL 8 (GLOVE) ×1 IMPLANT
GLOVE BIOGEL PI IND STRL 8.5 (GLOVE) ×1 IMPLANT
GLOVE BIOGEL PI INDICATOR 8 (GLOVE) ×1
GLOVE BIOGEL PI INDICATOR 8.5 (GLOVE) ×1
GLOVE ECLIPSE 7.5 STRL STRAW (GLOVE) ×2 IMPLANT
GLOVE EXAM NITRILE LRG STRL (GLOVE) IMPLANT
GLOVE EXAM NITRILE MD LF STRL (GLOVE) IMPLANT
GLOVE EXAM NITRILE XL STR (GLOVE) IMPLANT
GLOVE EXAM NITRILE XS STR PU (GLOVE) IMPLANT
GOWN STRL REUS W/ TWL LRG LVL3 (GOWN DISPOSABLE) IMPLANT
GOWN STRL REUS W/ TWL XL LVL3 (GOWN DISPOSABLE) IMPLANT
GOWN STRL REUS W/TWL 2XL LVL3 (GOWN DISPOSABLE) IMPLANT
GOWN STRL REUS W/TWL LRG LVL3 (GOWN DISPOSABLE)
GOWN STRL REUS W/TWL XL LVL3 (GOWN DISPOSABLE)
IPG PRECISION SPECTRA (Stimulator) ×2 IMPLANT
KIT BASIN OR (CUSTOM PROCEDURE TRAY) ×2 IMPLANT
KIT CHARGING (KITS) ×1
KIT CHARGING PRECISION NEURO (KITS) IMPLANT
KIT REMOTE CONTROL PRECISION (KITS) ×1 IMPLANT
KIT ROOM TURNOVER OR (KITS) ×2 IMPLANT
NDL HYPO 25X1 1.5 SAFETY (NEEDLE) ×1 IMPLANT
NEEDLE HYPO 25X1 1.5 SAFETY (NEEDLE) ×2 IMPLANT
NS IRRIG 1000ML POUR BTL (IV SOLUTION) ×2 IMPLANT
PACK LAMINECTOMY NEURO (CUSTOM PROCEDURE TRAY) ×2 IMPLANT
PAD ARMBOARD 7.5X6 YLW CONV (MISCELLANEOUS) ×2 IMPLANT
PADDLE BLANK (MISCELLANEOUS) ×2 IMPLANT
PADDLE LEAD IPG (Lead) ×2 IMPLANT
PASSER ELEVATOR (SPINAL CORD STIMULATOR) ×1 IMPLANT
SPONGE LAP 4X18 X RAY DECT (DISPOSABLE) IMPLANT
SPONGE SURGIFOAM ABS GEL SZ50 (HEMOSTASIS) ×2 IMPLANT
STAPLER SKIN PROX WIDE 3.9 (STAPLE) ×2 IMPLANT
STRIP CLOSURE SKIN 1/2X4 (GAUZE/BANDAGES/DRESSINGS) IMPLANT
SUT SILK 0 TIES 10X30 (SUTURE) IMPLANT
SUT SILK 2 0 FS (SUTURE) ×6 IMPLANT
SUT SILK 2 0 TIES 10X30 (SUTURE) ×2 IMPLANT
SUT VIC AB 0 CT1 18XCR BRD8 (SUTURE) ×1 IMPLANT
SUT VIC AB 0 CT1 8-18 (SUTURE) ×2
SUT VIC AB 2-0 CP2 18 (SUTURE) ×2 IMPLANT
SUT VIC AB 3-0 SH 8-18 (SUTURE) ×2 IMPLANT
TOOL LONG TUNNEL (SPINAL CORD STIMULATOR) ×1 IMPLANT
TOWEL OR 17X24 6PK STRL BLUE (TOWEL DISPOSABLE) ×2 IMPLANT
TOWEL OR 17X26 10 PK STRL BLUE (TOWEL DISPOSABLE) ×2 IMPLANT
WATER STERILE IRR 1000ML POUR (IV SOLUTION) ×2 IMPLANT

## 2014-12-02 NOTE — H&P (Signed)
Patient ID:   [id number] Patient: Alexander Booth  Date of Birth: [birth year] Visit Type: Office Visit   Date: 11/07/2014 11:15 AM Provider: Marchia Meiers. Vertell Limber MD   This 69 year old male presents for back pain.  History of Present Illness: 1.  back pain  Patient visits to discuss his options for spinal cord stimulator implant.  I met with the patient and later met with Dr. [name] and together we discussed his situation.  Mr. Cogdell is complaining of pain radiating into the parascapular region which is very intense.  Dr. [name] was not able to get a lead high enough to cover that area.  He wishes for me to place a 16 contact lead with the bottom of the lead at T8 and going up as high as possible into his thoracic spine.  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  We will tentatively plan on proceeding with surgery on 12 May.  Risks and benefits were discussed in detail with the patient.  His examination reveals full strength in both lower extremities.      Medical/Surgical/Interim History Reviewed, no change.   PAST MEDICAL HISTORY, SURGICAL HISTORY, FAMILY HISTORY, SOCIAL HISTORY AND REVIEW OF SYSTEMS I have reviewed the patient's past medical, surgical, family and social history as well as the comprehensive review of systems as included on the [facility] history form dated 11/09/2013, which I have signed.  Family History: Reviewed, no changes.    Social History: Tobacco use reviewed. Reviewed, no changes.     MEDICATIONS(added, continued or stopped this visit): Started Medication Directions Instruction Stopped   Aricept 10 mg tablet      divalproex ER 500 mg tablet,extended release 24 hr      fluoxetine 20 mg capsule     10/11/2014 MELOXICAM 15 MG TABLET TAKE 1 TABLET BY MOUTH EVERY DAY     metformin 500 mg tablet     09/08/2014 Norco 7.5 mg-325 mg tablet take 1 tablet by  oral route  every 6 hours as needed for pain, DNF 09/10/14     simvastatin 40 mg tablet     07/12/2014 tizanidine 4 mg capsule take 1 capsule by oral route 4 times every day    10/18/2014 TIZANIDINE HCL 4 MG TABLET TAKE 1 TABLET BY MOUTH 4 TIMES EVERY DAY     trazodone 100 mg tablet        ALLERGIES: Ingredient Reaction Medication Name Comment  CODEINE Rash     Reviewed, no changes.    Vitals Date Temp F BP Pulse Ht In Wt Lb BMI BSA Pain Score  11/07/2014  136/75 60 72 176 23.87  6/10     DIAGNOSTIC RESULTS Thoracic MRI demonstrates a capacious spinal canal without compressive pathology apparent.    IMPRESSION Proceed with 16 contact spinal cord stimulator lead placement with the bottom of the contacts at the T8 level  Assessment/Plan # Detail Type Description   1. Assessment Radiculopathy, lumbar region (M54.16).       2. Assessment Pain in thoracic spine (M54.6).       3. Assessment Chronic low back pain (M54.5).       4. Assessment Bilateral hip pain (M25.551).         Pain Assessment/Treatment Pain Scale: 6/10. Method: Numeric Pain Intensity Scale. Location: back. Onset: 11/06/2013. Duration: varies. Quality: discomforting. Pain Assessment/Treatment follow-up plan of care: Patient is taking medications as prescribed.Marland Kitchen  Fall Risk Plan The patient has not fallen in the last year.  Surgery planned for mid May 2016.             Provider:  Marchia Meiers. Vertell Limber MD  11/07/2014 12:02 PM Dictation edited by: Marchia Meiers. Vertell Limber    CC Providers: [facility] [address] [place], [place] [address]-              Electronically signed by Marchia Meiers. Vertell Limber MD on 11/07/2014 12:03 PM

## 2014-12-02 NOTE — Brief Op Note (Signed)
12/02/2014  5:15 PM  PATIENT:  Alexander Booth  69 y.o. male  PRE-OPERATIVE DIAGNOSIS:  Radiculopathy, Lumbar region with chronic pain  POST-OPERATIVE DIAGNOSIS:  Radiculopathy, Lumbar region with chronic pain   PROCEDURE:  Procedure(s) with comments: THORACIC SPINAL CORD STIMULATOR INSERTION INFINION [employer] 16 LEAD WITH LAMINECTOMY (N/A) - THORACIC SPINAL CORD STIMULATOR INSERTION INFINION [employer] 16 LEAD WITH LAMINECTOMY  SURGEON:  Surgeon(s) and Role:    * Erline Levine, MD - Primary  PHYSICIAN ASSISTANT:   ASSISTANTS: Poteat, RN   ANESTHESIA:   general  EBL:  Total I/O In: 1400 [I.V.:1400] Out: 50 [Blood:50]  BLOOD ADMINISTERED:none  DRAINS: none   LOCAL MEDICATIONS USED:  LIDOCAINE   SPECIMEN:  No Specimen  DISPOSITION OF SPECIMEN:  N/A  COUNTS:  YES  TOURNIQUET:  * No tourniquets in log *  DICTATION: Patient is 69 year old man with previous  fusion surgery of the L 12spine who has chronic pain. Dr. [name] attempted to pass percutaneous leads, but was unable to do so.  Therefore, the presents for laminectomy and placement of spinal cord stimulator.  Procedure: Following smooth and uncomplicated induction of general endotracheal anesthesia the patient was placed in a prone position on chest rolls. C-arm was used to mark the T9 level. His right flank was also marked for placement of implantable pulse generator. His back was then prepped and draped in the usual sterile fashion with Betadine scrub and Duraprep. Area of planned incision was infiltrated with local lidocaine. An incision was made carried to the thoracic fascia which was incised on the left side of midline and a subperiosteal dissection was performed exposing the T9 10 interlaminar space. After confirming correct orientation with C-arm a thoracic laminectomy was performed a high-speed drill and Kerrison rongeurs exposing the spinal cord dura. A 4 x8 [employer] paddle electrode was  then inserted in the epidural space and was found to be well positioned within the midline up to the superior aspect of T7. Anchors were then placed in the fascia was closed. A subcutaneous pocket was created to the right of midline. The tunneler was used to create a subcutaneous tract for the electrodes and these were then passed, anchored to the implantable pulse generator and connections were torqued appropriately. Redundant electrode was circularized beneath the Precision Spectra IPG and a strain release loop was placed in the laminectomy incision site. The wounds were irrigated and closed with 20 and 3-0 Vicryl stitches and the wounds were dressed with Dermabond. Impedances were assessed and found to be correct. Final radiograph demonstrated that the lead was in the midline from the upper half of T7 to the superior aspect of T 9.  Patient was extubated and was taken to recovery having tolerated procedure well counts were correct at the end of the case.   PLAN OF CARE: Admit for overnight observation  PATIENT DISPOSITION:  PACU - hemodynamically stable.   Delay start of Pharmacological VTE agent (>24hrs) due to surgical blood loss or risk of bleeding: yes

## 2014-12-02 NOTE — Op Note (Signed)
12/02/2014  5:15 PM  PATIENT:  Alexander Booth  69 y.o. male  PRE-OPERATIVE DIAGNOSIS:  Radiculopathy, Lumbar region with chronic pain  POST-OPERATIVE DIAGNOSIS:  Radiculopathy, Lumbar region with chronic pain   PROCEDURE:  Procedure(s) with comments: THORACIC SPINAL CORD STIMULATOR INSERTION INFINION [employer] 16 LEAD WITH LAMINECTOMY (N/A) - THORACIC SPINAL CORD STIMULATOR INSERTION INFINION [employer] 16 LEAD WITH LAMINECTOMY  SURGEON:  Surgeon(s) and Role:    * Erline Levine, MD - Primary  PHYSICIAN ASSISTANT:   ASSISTANTS: Poteat, RN   ANESTHESIA:   general  EBL:  Total I/O In: 1400 [I.V.:1400] Out: 50 [Blood:50]  BLOOD ADMINISTERED:none  DRAINS: none   LOCAL MEDICATIONS USED:  LIDOCAINE   SPECIMEN:  No Specimen  DISPOSITION OF SPECIMEN:  N/A  COUNTS:  YES  TOURNIQUET:  * No tourniquets in log *  DICTATION: Patient is 69 year old man with previous  fusion surgery of the L 12spine who has chronic pain. Dr. [name] attempted to pass percutaneous leads, but was unable to do so.  Therefore, the presents for laminectomy and placement of spinal cord stimulator.  Procedure: Following smooth and uncomplicated induction of general endotracheal anesthesia the patient was placed in a prone position on chest rolls. C-arm was used to mark the T9 level. His right flank was also marked for placement of implantable pulse generator. His back was then prepped and draped in the usual sterile fashion with Betadine scrub and Duraprep. Area of planned incision was infiltrated with local lidocaine. An incision was made carried to the thoracic fascia which was incised on the left side of midline and a subperiosteal dissection was performed exposing the T9 10 interlaminar space. After confirming correct orientation with C-arm a thoracic laminectomy was performed a high-speed drill and Kerrison rongeurs exposing the spinal cord dura. A 4 x8 [employer] paddle electrode was  then inserted in the epidural space and was found to be well positioned within the midline up to the superior aspect of T7. Anchors were then placed in the fascia was closed. A subcutaneous pocket was created to the right of midline. The tunneler was used to create a subcutaneous tract for the electrodes and these were then passed, anchored to the implantable pulse generator and connections were torqued appropriately. Redundant electrode was circularized beneath the Precision Spectra IPG and a strain release loop was placed in the laminectomy incision site. The wounds were irrigated and closed with 20 and 3-0 Vicryl stitches and the wounds were dressed with Dermabond. Impedances were assessed and found to be correct. Final radiograph demonstrated that the lead was in the midline from the upper half of T7 to the superior aspect of T 9.  Patient was extubated and was taken to recovery having tolerated procedure well counts were correct at the end of the case.   PLAN OF CARE: Admit for overnight observation  PATIENT DISPOSITION:  PACU - hemodynamically stable.   Delay start of Pharmacological VTE agent (>24hrs) due to surgical blood loss or risk of bleeding: yes

## 2014-12-02 NOTE — Anesthesia Procedure Notes (Signed)
Procedure Name: Intubation Date/Time: 12/02/2014 3:39 PM Performed by: Garrison Columbus T Pre-anesthesia Checklist: Patient identified, Emergency Drugs available, Patient being monitored and Suction available Patient Re-evaluated:Patient Re-evaluated prior to inductionOxygen Delivery Method: Circle system utilized Preoxygenation: Pre-oxygenation with 100% oxygen Intubation Type: IV induction Ventilation: Mask ventilation without difficulty Laryngoscope Size: Miller and 2 Grade View: Grade I Tube type: Oral Tube size: 7.5 mm Number of attempts: 1 Airway Equipment and Method: Stylet Placement Confirmation: ETT inserted through vocal cords under direct vision,  positive ETCO2 and breath sounds checked- equal and bilateral Secured at: 23 cm Tube secured with: Tape Dental Injury: Teeth and Oropharynx as per pre-operative assessment

## 2014-12-02 NOTE — Progress Notes (Signed)
Patient admitted via PACU. Patient alert and oriented x 3. Patient oriented to room and made comfortable. Will continue to monitor.

## 2014-12-02 NOTE — Anesthesia Preprocedure Evaluation (Addendum)
Anesthesia Evaluation  Patient identified by MRN, date of birth, ID band Patient awake    Reviewed: Allergy & Precautions, NPO status , Patient's Chart, lab work & pertinent test results  History of Anesthesia Complications Negative for: history of anesthetic complications  Airway Mallampati: II  TM Distance: >3 FB Neck ROM: Full    Dental  (+) Edentulous Upper, Edentulous Lower   Pulmonary COPDCurrent Smoker,  breath sounds clear to auscultation        Cardiovascular hypertension, Pt. on medications - anginaRhythm:Regular Rate:Normal     Neuro/Psych PSYCHIATRIC DISORDERS Depression Chronic back pain    GI/Hepatic negative GI ROS, Neg liver ROS,   Endo/Other  diabetes (glu 98, diet conrtrolled)  Renal/GU      Musculoskeletal  (+) Arthritis -,   Abdominal   Peds  Hematology negative hematology ROS (+)   Anesthesia Other Findings   Reproductive/Obstetrics                          Anesthesia Physical Anesthesia Plan  ASA: III  Anesthesia Plan: General   Post-op Pain Management:    Induction: Intravenous  Airway Management Planned: Oral ETT  Additional Equipment:   Intra-op Plan:   Post-operative Plan: Extubation in OR  Informed Consent: I have reviewed the patients History and Physical, chart, labs and discussed the procedure including the risks, benefits and alternatives for the proposed anesthesia with the patient or authorized representative who has indicated his/her understanding and acceptance.   Dental advisory given  Plan Discussed with: CRNA, Anesthesiologist and Surgeon  Anesthesia Plan Comments: (Plan routine monitors, GETA)       Anesthesia Quick Evaluation

## 2014-12-02 NOTE — Progress Notes (Signed)
Awake, alert, conversant.  MAEW.  Doing well.  

## 2014-12-02 NOTE — Interval H&P Note (Signed)
History and Physical Interval Note:  12/02/2014 3:02 PM  [place]  has presented today for surgery, with the diagnosis of Radiculopathy, Lumbar region  The various methods of treatment have been discussed with the patient and family. After consideration of risks, benefits and other options for treatment, the patient has consented to  Procedure(s) with comments: LUMBAR SPINAL CORD STIMULATOR INSERTION INFINION [employer] 16 LEAD WITH LAMINECTOMY (N/A) - LUMBAR SPINAL CORD STIMULATOR INSERTION INFINION [employer] 16 LEAD WITH LAMINECTOMY as a surgical intervention .  The patient's history has been reviewed, patient examined, no change in status, stable for surgery.  I have reviewed the patient's chart and labs.  Questions were answered to the patient's satisfaction.     Janiyla Long D

## 2014-12-02 NOTE — Transfer of Care (Signed)
Immediate Anesthesia Transfer of Care Note  Patient: Alexander Booth [facility]  Procedure(s) Performed: Procedure(s) with comments: THORACIC SPINAL CORD STIMULATOR INSERTION INFINION [employer] 16 LEAD WITH LAMINECTOMY (N/A) - THORACIC SPINAL CORD STIMULATOR INSERTION INFINION [employer] 16 LEAD WITH LAMINECTOMY  Patient Location: PACU  Anesthesia Type:General  Level of Consciousness: awake, alert  and oriented  Airway & Oxygen Therapy: Patient Spontanous Breathing and Patient connected to nasal cannula oxygen  Post-op Assessment: Report given to RN, Post -op Vital signs reviewed and stable and Patient moving all extremities X 4  Post vital signs: Reviewed and stable  Last Vitals:  Filed Vitals:   12/02/14 1159  BP: 144/63  Pulse: 58  Temp: 36.6 C  Resp: 18    Complications: No apparent anesthesia complications

## 2014-12-02 NOTE — Anesthesia Postprocedure Evaluation (Signed)
  Anesthesia Post-op Note  Patient: Alexander Booth [facility]  Procedure(s) Performed: Procedure(s) with comments: THORACIC SPINAL CORD STIMULATOR INSERTION INF[employer] 16 LEAD WITH LAMINECTOMY (N/A) - THORACIC SPINAL CORD STIMULATOR INSERTION IN[employer] 16 LEAD WITH LAMINECTOMY  Patient Location: PACU  Anesthesia Type:General  Level of Consciousness: awake, alert , oriented and patient cooperative  Airway and Oxygen Therapy: Patient Spontanous Breathing  Post-op Pain: none  Post-op Assessment: Post-op Vital signs reviewed, Patient's Cardiovascular Status Stable, Respiratory Function Stable, Patent Airway, No signs of Nausea or vomiting and Pain level controlled  Post-op Vital Signs: Reviewed and stable  Last Vitals:  Filed Vitals:   12/02/14 1736  BP: 147/59  Pulse: 68  Temp:   Resp: 12    Complications: No apparent anesthesia complications

## 2014-12-03 MED ORDER — HYDROCODONE-ACETAMINOPHEN 7.5-325 MG PO TABS: 1.0000 | ORAL_TABLET | Freq: Four times a day (QID) | ORAL | Status: DC | PRN

## 2014-12-03 NOTE — Evaluation (Signed)
Occupational Therapy Evaluation Patient Details Name: Alexander Booth MRN: [id number] DOB: Aug 07, 1945 Today's Date: 12/03/2014    History of Present Illness 69 y.o. male s/p Spinal cord stimulator placement.   Clinical Impression   Pt presents with above diagnosis and seen for acute OT evaluation. Pt completed ADLs as detailed below. ADL and home setup education provided to pt and family. Educated on back precautions. No further OT needs indicated at this time.   Follow Up Recommendations  Supervision/Assistance - 24 hour;No OT follow up    Equipment Recommendations  None recommended by OT    Recommendations for Other Services       Precautions / Restrictions Precautions Precautions: Back Precaution Booklet Issued: Yes (comment) Precaution Comments: reviewed back precautions Restrictions Weight Bearing Restrictions: No      Mobility Bed Mobility   General bed mobility comments: Pt practiced with PT earlier. Pt and family indicated they feel comfortable with logrolling technique.  Transfers Overall transfer level: Needs assistance Equipment used: None Transfers: Sit to/from Stand Sit to Stand: Supervision         General transfer comment: supervision for safety. Cues for no bending during sit<>stand toilet transfer.     Balance Overall balance assessment: Needs assistance Sitting-balance support: Feet supported Sitting balance-Leahy Scale: Fair     Standing balance support: During functional activity;No upper extremity supported Standing balance-Leahy Scale: Fair Standing balance comment: uses cane at home. family indicated he may use rw during recovery.                            ADL Overall ADL's : Needs assistance/impaired Eating/Feeding: Set up;Sitting   Grooming: Set up;Sitting;Standing   Upper Body Bathing: Sitting;Set up;Minimal assitance;With adaptive equipment   Lower Body Bathing: Min guard;With adaptive equipment;Sit to/from  stand   Upper Body Dressing : Set up;Minimal assistance;Sitting   Lower Body Dressing: Min guard;With adaptive equipment;Sit to/from stand   Toilet Transfer: Supervision/safety;Ambulation;Regular Toilet;Grab bars   Toileting- Clothing Manipulation and Hygiene: Min guard;Sit to/from stand   Tub/ Shower Transfer: Min guard;Ambulation;Shower seat   Functional mobility during ADLs: Supervision/safety General ADL Comments: Educated pt and family on compensatory techniques and AE for ADLs with back precautions. Discussed using sink next to toilet at home along with cane to steady self during toilet transfers. Pt's daughter indicated pt may switch to rw at home during recovery. Cued pt on upright posture during toillet transfer to maintain no bending precaution - educated family on this as well. Discussed safety with home setup.      Vision     Perception     Praxis      Pertinent Vitals/Pain Pain Assessment: 0-10 Pain Score: 8  Pain Location: back Pain Descriptors / Indicators:  (hurts) Pain Intervention(s): Limited activity within patient's tolerance;Monitored during session;Repositioned;Other (comment) (Pt declined nofitifying nurse for pain meds.)     Hand Dominance Right   Extremity/Trunk Assessment Upper Extremity Assessment Upper Extremity Assessment: Overall WFL for tasks assessed   Lower Extremity Assessment Lower Extremity Assessment: Defer to PT evaluation       Communication Communication Communication: HOH   Cognition Arousal/Alertness: Awake/alert Behavior During Therapy: WFL for tasks assessed/performed Overall Cognitive Status: Within Functional Limits for tasks assessed                     General Comments       Exercises       Shoulder Instructions  Home Living Family/patient expects to be discharged to:: Private residence Living Arrangements: Spouse/significant other Available Help at Discharge: Family;Available 24 hours/day Type  of Home: House Home Access: Stairs to enter [employer] of Steps: 4 Entrance Stairs-Rails: None Home Layout: One level     Bathroom Shower/Tub: Occupational psychologist: Handicapped height     Home Equipment: [place] - single point;Walker - 2 wheels;Shower seat - built in;Grab bars - toilet;Adaptive equipment Adaptive Equipment: Reacher        Prior Functioning/Environment Level of Independence: Independent with assistive device(s)        Comments: cane for mobility    OT Diagnosis: Acute pain   OT Problem List:     OT Treatment/Interventions:      OT Goals(Current goals can be found in the care plan section) Acute Rehab OT Goals Patient Stated Goal: Sleep  OT Frequency:     Barriers to D/C:            Co-evaluation              End of Session    Activity Tolerance: Patient tolerated treatment well Patient left: in bed;with call bell/phone within reach;with family/visitor present   Time: 6440-3474 OT Time Calculation (min): 9 min Charges:  OT General Charges $OT Visit: 1 Procedure OT Evaluation $Initial OT Evaluation Tier I: 1 Procedure G-Codes: OT G-codes **NOT FOR INPATIENT CLASS** Functional Assessment Tool Used: clinical judgement Functional Limitation: Self care Self Care Current Status (Q5956): At least 1 percent but less than 20 percent impaired, limited or restricted Self Care Goal Status (L8756): At least 1 percent but less than 20 percent impaired, limited or restricted Self Care Discharge Status [phone]): At least 1 percent but less than 20 percent impaired, limited or restricted  Alexander Booth 12/03/2014, 10:14 AM

## 2014-12-03 NOTE — Discharge Summary (Signed)
Physician Discharge Summary  Patient ID: Alexander Booth MRN: [id number] DOB/AGE: 25-Nov-1945 69 y.o.  Admit date: 12/02/2014 Discharge date: 12/03/2014  Admission Diagnoses: Chronic back pain   Discharge Diagnoses: Same   Discharged Condition: good  Hospital Course: The patient was admitted on 12/02/2014 and taken to the operating room where the patient underwent spinal cord stimulator placement. The patient tolerated the procedure well and was taken to the recovery room and then to the floor in stable condition. The hospital course was routine. There were no complications. The wound remained clean dry and intact. Pt had appropriate back soreness. No complaints of leg pain or new N/T/W. The patient remained afebrile with stable vital signs, and tolerated a regular diet. The patient continued to increase activities, and pain was well controlled with oral pain medications.   Consults: None  Significant Diagnostic Studies:  Results for orders placed or performed during the hospital encounter of 12/02/14  Glucose, capillary  Result Value Ref Range   Glucose-Capillary 98 65 - 99 mg/dL    Dg Thoracic Spine 1 View  12/02/2014   CLINICAL DATA:  Spinal stimulator placed that T9.  EXAM: DG C-ARM 61-120 MIN; THORACIC SPINE - 1 VIEW  FLUOROSCOPY TIME:  Radiation Exposure Index (as provided by the fluoroscopic device):  If the device does not provide the exposure index:  Fluoroscopy Time (in minutes and seconds):  0 minutes 16 seconds  Number of Acquired Images:  2  COMPARISON:  04/20/2013 and MRI 07/30/2014  FINDINGS: Examination demonstrates evidence of patient's spinal stimulator device with inferior extent at the T9 level extending superiorly off the field-of-view. There is evidence of patient's posterior fusion hardware at the L1-2 level with previous kyphoplasty L1. Mild degenerate change of the spine.  IMPRESSION: Evidence of patient's spinal stimulator device with the inferior extent at the T9  level.   Electronically Signed   By: Marin Olp M.D.   On: 12/02/2014 17:37   Dg C-arm 1-60 Min  12/02/2014   CLINICAL DATA:  Spinal stimulator placed that T9.  EXAM: DG C-ARM 61-120 MIN; THORACIC SPINE - 1 VIEW  FLUOROSCOPY TIME:  Radiation Exposure Index (as provided by the fluoroscopic device):  If the device does not provide the exposure index:  Fluoroscopy Time (in minutes and seconds):  0 minutes 16 seconds  Number of Acquired Images:  2  COMPARISON:  04/20/2013 and MRI 07/30/2014  FINDINGS: Examination demonstrates evidence of patient's spinal stimulator device with inferior extent at the T9 level extending superiorly off the field-of-view. There is evidence of patient's posterior fusion hardware at the L1-2 level with previous kyphoplasty L1. Mild degenerate change of the spine.  IMPRESSION: Evidence of patient's spinal stimulator device with the inferior extent at the T9 level.   Electronically Signed   By: Marin Olp M.D.   On: 12/02/2014 17:37    Antibiotics:  Anti-infectives    Start     Dose/Rate Route Frequency Ordered Stop   12/02/14 2300  ceFAZolin (ANCEF) IVPB 1 g/50 mL premix     1 g 100 mL/hr over 30 Minutes Intravenous Every 8 hours 12/02/14 1815 12/03/14 0635   12/02/14 0600  ceFAZolin (ANCEF) IVPB 2 g/50 mL premix     2 g 100 mL/hr over 30 Minutes Intravenous On call to O.R. 12/01/14 1312 12/02/14 1545      Discharge Exam: Blood pressure 124/59, pulse 51, temperature 98.1 F (36.7 C), temperature source Oral, resp. rate 16, height 6\' 1"  (1.854 m), weight 183 lb  1.6 oz (83.054 kg), SpO2 95 %. Neurologic: Grossly normal Dressings dry  Discharge Medications:     Medication List    TAKE these medications        digoxin 0.125 MG tablet  Commonly known as:  LANOXIN  Take 1 tablet (0.125 mg total) by mouth daily.     divalproex 500 MG 24 hr tablet  Commonly known as:  DEPAKOTE ER  Take 2 tablets (1,000 mg total) by mouth daily.     donepezil 10 MG tablet   Commonly known as:  ARICEPT  Take 1 tablet (10 mg total) by mouth every morning.     FLUoxetine 20 MG capsule  Commonly known as:  PROZAC  Take 1 capsule (20 mg total) by mouth daily.     HYDROcodone-acetaminophen 7.5-325 MG per tablet  Commonly known as:  NORCO  Take 1 tablet by mouth every 6 (six) hours as needed for moderate pain.     LAXATIVE PO  Take 5 mg by mouth daily as needed (constipation). Biscodyl     neomycin-bacitracin-polymyxin [phone] ointment  Apply 1 application topically as needed (wound).     nitroGLYCERIN 0.4 MG SL tablet  Commonly known as:  NITROSTAT  Place 1 tablet (0.4 mg total) under the tongue every 5 (five) minutes as needed for chest pain.     simvastatin 40 MG tablet  Commonly known as:  ZOCOR  Take 1 tablet (40 mg total) by mouth daily at 6 PM.     tiZANidine 4 MG tablet  Commonly known as:  ZANAFLEX  Take 4 mg by mouth every 6 (six) hours as needed for muscle spasms. As needed     traZODone 100 MG tablet  Commonly known as:  DESYREL  Take 2 tablets (200 mg total) by mouth at bedtime.        Disposition: Home   Final Dx: Spinal cord stimulator placement      Discharge Instructions     Remove dressing in 72 hours    Complete by:  As directed      Call MD for:  difficulty breathing, headache or visual disturbances    Complete by:  As directed      Call MD for:  persistant nausea and vomiting    Complete by:  As directed      Call MD for:  redness, tenderness, or signs of infection (pain, swelling, redness, odor or green/yellow discharge around incision site)    Complete by:  As directed      Call MD for:  severe uncontrolled pain    Complete by:  As directed      Call MD for:  temperature >100.4    Complete by:  As directed      Diet - low sodium heart healthy    Complete by:  As directed      Discharge instructions    Complete by:  As directed   No driving, no strenuous activity, no bending twisting or lifting     Increase  activity slowly    Complete by:  As directed            Follow-up Information    Follow up with STERN,JOSEPH D, MD In 2 weeks.   Specialty:  Neurosurgery   Contact information:   [address] [place] [address] [place] [address] [phone]        Signed: Eustace Moore 12/03/2014, 7:58 AM

## 2014-12-03 NOTE — Progress Notes (Signed)
Patient is discharged from room 4N20 at this time. Alert and in stable condition. IV site d/c'd and instructions read to patient and family with understanding verbalized. Left unit via wheelchair with all belongings at side.

## 2014-12-03 NOTE — Evaluation (Addendum)
Physical Therapy Evaluation and Discharge Patient Details Name: Alexander Booth MRN: [id number] DOB: [birth year] Today's Date: 12/03/2014   History of Present Illness  69 y.o. male s/p Spinal cord stimulator placement.  Clinical Impression  Patient evaluated by Physical Therapy with no further acute PT needs identified. All education has been completed and the patient has no further questions. Ambulates generally well, recommend cane use at home which pt uses at baseline. Safely completed stair training with min/hand held assist. 2/4 dyspnea on exertion, obviously deconditioned stating he rarely gets out of his house due to back pain. SpO2 92-94% on room air, HR in 50s. Outpatient PT for balance and conditioning when cleared by MD. Good family support at home 24/7. See below for any follow-up Physial Therapy or equipment needs. PT is signing off. Thank you for this referral.     Follow Up Recommendations Outpatient PT (after follow-up appointment with MD)    Equipment Recommendations  None recommended by PT    Recommendations for Other Services       Precautions / Restrictions Precautions Precautions: Back Precaution Booklet Issued: Yes (comment) Precaution Comments: reviewed back precautions Restrictions Weight Bearing Restrictions: No      Mobility  Bed Mobility Overal bed mobility: Needs Assistance Bed Mobility: Rolling;Sidelying to Sit;Sit to Sidelying Rolling: Supervision Sidelying to sit: Supervision     Sit to sidelying: Supervision General bed mobility comments: Educated on log roll technique. VC for back precautions. use of rail but not physical assist.  Transfers Overall transfer level: Needs assistance Equipment used: None Transfers: Sit to/from Stand Sit to Stand: Supervision         General transfer comment: supervision for safety. VC for neutral back alignment to rise and hand placement. No assist needed.  Ambulation/Gait Ambulation/Gait assistance:  Supervision Ambulation Distance (Feet): 125 Feet Assistive device: None Gait Pattern/deviations: Step-through pattern;Decreased stride length;Drifts right/left Gait velocity: decreased   General Gait Details: Rigid gait,slow with minor drift left and right at times but no loss of balance requiring assist. VC for stability awareness. Reaches for rail in hallway at times. 2/4 dyspnea with SpO2 in lower to mid 90s on room air. Obviously deconditioned.  Stairs Stairs: Yes Stairs assistance: Min assist Stair Management: No rails;Step to pattern;Alternating pattern;Forwards Number of Stairs: 8 General stair comments: Min hand held assist for balance. Instructed for step-to pattern as pt was slightly more unstable when he initially attempted alternating steps. Reports mild dizziness.  Wheelchair Mobility    Modified Rankin (Stroke Patients Only)       Balance Overall balance assessment: Needs assistance Sitting-balance support: No upper extremity supported;Feet supported Sitting balance-Leahy Scale: Fair     Standing balance support: No upper extremity supported Standing balance-Leahy Scale: Fair                               Pertinent Vitals/Pain Pain Assessment: 0-10 Pain Score: 7  Pain Location: back Pain Descriptors / Indicators:  (hurts) Pain Intervention(s): Monitored during session;Repositioned    Home Living Family/patient expects to be discharged to:: Private residence Living Arrangements: Spouse/significant other Available Help at Discharge: Family;Available 24 hours/day Type of Home: House Home Access: Stairs to enter Entrance Stairs-Rails: None Entrance Stairs-Number of Steps: 4 Home Layout: One level Home Equipment: Cane - single point;Walker - 2 wheels      Prior Function Level of Independence: Independent with assistive device(s)         Comments: cane for  mobility     Hand Dominance   Dominant Hand: Right    Extremity/Trunk  Assessment   Upper Extremity Assessment: Defer to OT evaluation           Lower Extremity Assessment: Overall WFL for tasks assessed         Communication   Communication: HOH  Cognition Arousal/Alertness: Awake/alert Behavior During Therapy: WFL for tasks assessed/performed Overall Cognitive Status: Within Functional Limits for tasks assessed                      General Comments General comments (skin integrity, edema, etc.): Reports dizziness at times. SpO2 92-94% on room air. HR in 50s.    Exercises        Assessment/Plan    PT Assessment Patent does not need any further PT services  PT Diagnosis Abnormality of gait;Acute pain   PT Problem List    PT Treatment Interventions     PT Goals (Current goals can be found in the Care Plan section) Acute Rehab PT Goals Patient Stated Goal: Sleep PT Goal Formulation: All assessment and education complete, DC therapy    Frequency     Barriers to discharge        Co-evaluation               End of Session   Activity Tolerance: Patient tolerated treatment well Patient left: in chair;with call bell/phone within reach;with family/visitor present Nurse Communication: Mobility status    Functional Assessment Tool Used: clinical observation Functional Limitation: Mobility: Walking and moving around Mobility: Walking and Moving Around Current Status (P3825): At least 1 percent but less than 20 percent impaired, limited or restricted Mobility: Walking and Moving Around Goal Status [phone]): At least 1 percent but less than 20 percent impaired, limited or restricted Mobility: Walking and Moving Around Discharge Status [phone]): At least 1 percent but less than 20 percent impaired, limited or restricted    Time: 0843-0901 PT Time Calculation (min) (ACUTE ONLY): 18 min   Charges:   PT Evaluation $Initial PT Evaluation Tier I: 1 Procedure     PT G Codes:   PT G-Codes **NOT FOR INPATIENT CLASS** Functional  Assessment Tool Used: clinical observation Functional Limitation: Mobility: Walking and moving around Mobility: Walking and Moving Around Current Status (P3790): At least 1 percent but less than 20 percent impaired, limited or restricted Mobility: Walking and Moving Around Goal Status [phone]): At least 1 percent but less than 20 percent impaired, limited or restricted Mobility: Walking and Moving Around Discharge Status [phone]): At least 1 percent but less than 20 percent impaired, limited or restricted    Ellouise Newer 12/03/2014, 9:33 AM Elayne Snare, [place]

## 2014-12-05 ENCOUNTER — Encounter (HOSPITAL_COMMUNITY): Payer: Self-pay | Admitting: Neurosurgery

## 2014-12-05 NOTE — Progress Notes (Signed)
RETRO UR COMPLETED

## 2015-01-16 ENCOUNTER — Other Ambulatory Visit: Payer: Self-pay

## 2015-02-01 DIAGNOSIS — E1142 Type 2 diabetes mellitus with diabetic polyneuropathy: Secondary | ICD-10-CM | POA: Insufficient documentation

## 2015-02-01 DIAGNOSIS — M545 Low back pain: Secondary | ICD-10-CM | POA: Diagnosis not present

## 2015-02-06 DIAGNOSIS — M7582 Other shoulder lesions, left shoulder: Secondary | ICD-10-CM | POA: Diagnosis not present

## 2015-02-10 DIAGNOSIS — M7582 Other shoulder lesions, left shoulder: Secondary | ICD-10-CM | POA: Diagnosis not present

## 2015-02-10 DIAGNOSIS — M25512 Pain in left shoulder: Secondary | ICD-10-CM | POA: Diagnosis not present

## 2015-02-15 DIAGNOSIS — M25512 Pain in left shoulder: Secondary | ICD-10-CM | POA: Diagnosis not present

## 2015-02-21 ENCOUNTER — Other Ambulatory Visit: Payer: Self-pay | Admitting: Cardiology

## 2015-03-13 ENCOUNTER — Other Ambulatory Visit: Payer: Self-pay | Admitting: Gastroenterology

## 2015-03-13 DIAGNOSIS — K625 Hemorrhage of anus and rectum: Secondary | ICD-10-CM | POA: Diagnosis not present

## 2015-03-13 DIAGNOSIS — D126 Benign neoplasm of colon, unspecified: Secondary | ICD-10-CM | POA: Diagnosis not present

## 2015-03-13 DIAGNOSIS — D123 Benign neoplasm of transverse colon: Secondary | ICD-10-CM | POA: Diagnosis not present

## 2015-03-13 DIAGNOSIS — D125 Benign neoplasm of sigmoid colon: Secondary | ICD-10-CM | POA: Diagnosis not present

## 2015-03-13 DIAGNOSIS — Z09 Encounter for follow-up examination after completed treatment for conditions other than malignant neoplasm: Secondary | ICD-10-CM | POA: Diagnosis not present

## 2015-03-13 DIAGNOSIS — K5730 Diverticulosis of large intestine without perforation or abscess without bleeding: Secondary | ICD-10-CM | POA: Diagnosis not present

## 2015-03-13 DIAGNOSIS — K635 Polyp of colon: Secondary | ICD-10-CM | POA: Diagnosis not present

## 2015-03-13 DIAGNOSIS — Z86010 Personal history of colonic polyps: Secondary | ICD-10-CM | POA: Diagnosis not present

## 2015-03-14 ENCOUNTER — Telehealth: Payer: Self-pay

## 2015-03-14 NOTE — Telephone Encounter (Signed)
pts wife said pt dropped his glucose meter and it is broken; pt is out of test strips as well and [name] does not know name of meter. [name] will ck with ins co to see which meter, test strips and lancets are covered and will cb with info for request to be sent to [facility] mail order pharmacy.

## 2015-03-15 MED ORDER — ACCU-CHEK FASTCLIX LANCETS MISC: Status: DC

## 2015-03-15 MED ORDER — ACCU-CHEK NANO SMARTVIEW W/DEVICE KIT: PACK | Status: DC

## 2015-03-15 MED ORDER — GLUCOSE BLOOD VI STRP: ORAL_STRIP | Status: DC

## 2015-03-15 NOTE — Addendum Note (Signed)
Addended by: Helene Shoe on: 03/15/2015 08:21 AM   Modules accepted: Orders, Medications

## 2015-03-15 NOTE — Telephone Encounter (Addendum)
Mrs Hokenson left v/m; pt needs accuchek nano meter, accuchek test strips  And fast click lancets to [facility] order pharmacy. Advised pt done.

## 2015-03-21 DIAGNOSIS — M546 Pain in thoracic spine: Secondary | ICD-10-CM | POA: Diagnosis not present

## 2015-03-21 DIAGNOSIS — E1142 Type 2 diabetes mellitus with diabetic polyneuropathy: Secondary | ICD-10-CM | POA: Diagnosis not present

## 2015-03-21 DIAGNOSIS — M545 Low back pain: Secondary | ICD-10-CM | POA: Diagnosis not present

## 2015-04-13 ENCOUNTER — Encounter: Payer: Self-pay | Admitting: Neurology

## 2015-04-13 ENCOUNTER — Ambulatory Visit (INDEPENDENT_AMBULATORY_CARE_PROVIDER_SITE_OTHER): Payer: Commercial Managed Care - HMO | Admitting: Neurology

## 2015-04-13 VITALS — BP 120/62 | HR 62 | Resp 18 | Ht 73.0 in | Wt 178.0 lb

## 2015-04-13 DIAGNOSIS — M25562 Pain in left knee: Secondary | ICD-10-CM

## 2015-04-13 DIAGNOSIS — M25561 Pain in right knee: Secondary | ICD-10-CM

## 2015-04-13 DIAGNOSIS — R296 Repeated falls: Secondary | ICD-10-CM

## 2015-04-13 DIAGNOSIS — F329 Major depressive disorder, single episode, unspecified: Secondary | ICD-10-CM | POA: Diagnosis not present

## 2015-04-13 DIAGNOSIS — F0391 Unspecified dementia with behavioral disturbance: Secondary | ICD-10-CM

## 2015-04-13 DIAGNOSIS — F03918 Unspecified dementia, unspecified severity, with other behavioral disturbance: Secondary | ICD-10-CM

## 2015-04-13 DIAGNOSIS — Z9689 Presence of other specified functional implants: Secondary | ICD-10-CM

## 2015-04-13 DIAGNOSIS — F32A Depression, unspecified: Secondary | ICD-10-CM

## 2015-04-13 DIAGNOSIS — Z9889 Other specified postprocedural states: Secondary | ICD-10-CM

## 2015-04-13 NOTE — Patient Instructions (Addendum)
We will continue with current medications. Try to drink enough water, 6-8 glasses per day and mobilize in the form of walking if you can regularly. You may be able to use a stationary pedaling device.

## 2015-04-13 NOTE — Progress Notes (Signed)
Subjective:    Patient ID: Alexander Booth is a 69 y.o. male.  HPI     Interim history:   Alexander Booth is a very pleasant 68 year old gentleman with an underlying medical history of hypertension, diabetes, hyperlipidemia, hearing loss, smoking, and degenerative neck disease, status post neck surgery in October 2012 with ACDF, who presents for followup consultation of his memory loss and gait disorder. He is accompanied by his daughter, Alexander Booth, and his wife again today. I last saw him on 10/11/2014, at which time he reported a recent fall outside, he slipped off a curb. He did not hit his head. He had a spinal cord stimulator trial in February 2016 and felt it was helpful for his low back pain but not in his upper back pain. He had more depression. We had tried to reduce his Depakote in the past but he had some problems with it. He was on Aricept generic 10 mg daily. He was still on trazodone 200 mg daily. Chronic pain and severe arthritis pain were still the biggest issue for him. He was on hydrocodone, 7.5 mg twice daily and Mobic 15 mg daily. He had stomach pain when eating.   Today, 04/13/2015: He reports pain in both legs. He has pain in both feet. He has pain in the upper back. He had a spinal cord stimulator placed under Dr. Vertell Limber on 12/02/2014. I reviewed the operative note and discharge summary. he reports muscle spasms in his hamstrings. He uses a cane around the house and usually also outside. He has a walker available. He has lost some weight. He tries to drink enough water. He takes trazodone 100 mg at night now. He ran out of his Zanaflex. His wife reports that he takes less hydrocodone. He is off of meloxicam.  Previously:   I saw him on 04/11/2014, at which time he had recently undergone a steroid injection into his lower back which helped for a few days. He had some medication changes. He had been on tramadol, and got agitated when his Depakote was reduced. He was no longer on  hydrocodone or mobic. He had fallen 4 or 5 times. No loss of consciousness or head laceration was reported. He was carrying buckets of water and was still trying to be active in the yard. Most of his falls occurred outside. I ordered a [place] wo contrast due to recurrent falls. He had this on 04/19/2014: Mildly abnormal CT head (without) demonstrating:  1. Mild chronic small vessel ischemic disease, in the right caudate, right frontal juxtacortical, and left frontal periventricular regions. 2. No acute findings. 3. No change from MRI on 12/03/12. In addition, personally reviewed the images through the PACS system. We called his family with his test results.  I saw him on 12/09/2013, at which time he was primarily complaining of chronic pain in his lower back. He was seeing a pain specialist and had undergone steroid epidural injections. We talked about reducing his Depakote. He was advised to use his cane consistently and we talked about fall precautions again.  I saw him on 07/27/13, at which time we talked about fall safety, and I expressed concern regarding sedating medications that may contribute to his falls. His daughter reported 2 recent falls. I suggested we reduce his trazodone to total of 150 mg each night and his Depakote ER from 1000 mg to 500 mg. We talked about his back pain and he was supposed to see his neurosurgeon again and I advised him to  discuss physical therapy and pain management with Dr. Ellene Route. Of note, Alexander Booth talked to me in private without patient's knowledge, per her request, and she mentions, that the patient's father died recently and there is a dispute regarding the inheritance and the house. It turns out, the father was not his real father, and his he has 2 half-siblings. The patient has a son and another daughter, and the son has been pressuring the patient to keep going to the attorney. His daughter becomes tearful as she relates to me how much stress with her family has been  under recently and she feels that her father is not in any shape to endure this kind of stress. She is particularly worried about his stroke risk under pressure. She wants his son which is her brother to not bother him with the attorney issues. There have been clashes between the patient and his half sibling since his father died who turned out to be his stepfather. His mother died over 6 years ago from [place] and there is really no one who can tell them about his real father. His wife has had to take care of her mother and has been staying with her mother.    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 He had a recent L1 compression fracture and had to have surgery for this. He had a fall with injury recently while working on his roof despite my advice not to climb on roofs or climb on ladders. I advised him to start using a rolling walker with sliders in the back because of risk for falling and worsening of his gait which certainly took a setback after his fall. He had a repeat lumbar spine MRI on 07/17/2013: Interval L1-2 fusion. The central canal and foramina are open. No evidence of complication from surgery. The patient's examination is otherwise unchanged. No change in extra foraminal disc bulging on the left at L4-5 contacting the exited left L4 root. No change in a right lateral recess narrowing at L5-S1 with mild encroachment on the descending right S1 root. There is also mild right foraminal narrowing at this level.   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 I discussed his recent brain MRI and lumbar spine MRI results at the time. He had evidence of chronic white matter changes and we talked about prevention of cardiovascular disease at the time. He had stopped taking his metformin about 2-3 weeks prior to  his June visit with me. He was advised to continue to measure his capillary blood glucose levels. I felt that his gait disorder and balance problems for most likely multifactorial in nature, due to chronic back pain, neuropathy and atherosclerotic changes. He had a fall with injury when he had climbed on the roof despite my advice not to climb on any heights. He was re-educated in that regard and also advised not to use any heavy machinery. He also has a history of significant hearing loss.   He previously followed with Dr. Morene Antu since 2004.    I first met him on 09/29/2012 at which time I kept him on the same medications. He was advised to get a repeat brain MRI.   Neuropsychological testing was done in December 2004 which showed full-scale IQ of 46, verbal IQ of 64, a performance IQ of 70.  MMSE was in the 28 range. MRI brain in 2004 was normal, a PET scan was positive for Alzheimer's, with normal TSH, RPR, HIV, B12 and folate. He did have a borderline low B12. He had a normal methylmalonic acid level. Sleep study showed mild to moderate OSA but he could not tolerate CPAP. He has been on Aricept since March 2005. He has had burning foot pain for which gabapentin and Lyrica were tried without help. In March 2013 his MMSE was 24, clock drawing was 4, and normal fluency was 11. In January his MMSE was 23, clock drawing was 4, animal fluency was 9. His brain MRI showed a nonspecific posterior right parietal lobe lesion without contrast uptake or edema. A followup MRI was recommended. He had a brain MRI with and without contrast on 12/03/12, which showed nonspecific WM changes.    His Past Medical History Is Significant For: Past Medical History  Diagnosis Date  . Hypertension     Essential  . Hypercholesterolemia   . Lumbosacral disc disease     with chronic low back pain  . Bronchitis     Subacute and hx of ongoing cigarette smoking one half pack daily  . Diabetes mellitus     He's also had a hx  of high cholesterol   . FHx: heart disease     Positive for   . Dyslipidemia     He also has a past hx of   . Type 2 diabetes mellitus 2010    takes oral meds  . Fracture of lumbar vertebra 11/25/2012  . Hard of hearing     very little hearing on left side;better in right ear  . Erectile dysfunction     He's had a hx of   . Depression   . Presenile dementia   . Dementia     He has a hx of the   . Peripheral neuropathy   . Peripheral neuropathy     He also has   . Numbness and tingling in hands     and feet  . Anginal pain     His Past Surgical History Is Significant For: Past Surgical History  Procedure Laterality Date  . Knee surgery      Previous left total replacement  . Hemorrhoid surgery      x2  . Other surgical history  1992    Bone Fusion in Right Foot  . Anterior cervical decomp/discectomy fusion    . Appendectomy    . Joint replacement Left     2nd one was to change siZe  . Total knee arthroplasty Left   . Kyphoplasty N/A 12/10/2012    Procedure: Lumbar one Kyphoplasty;  Surgeon: Kristeen Miss, MD;  Location: [facility];  Service: Neurosurgery;  Laterality: N/A;  Lumbar One Kyphoplasty  . Spinal cord stimulator insertion N/A 12/02/2014    Procedure: THORACIC SPINAL CORD STIMULATOR INSERTION INFINION BOSTON SCIENTIFIC 16 LEAD WITH LAMINECTOMY;  Surgeon: Erline Levine, MD;  Location: [facility];  Service: Neurosurgery;  Laterality: N/A;  THORACIC SPINAL CORD STIMULATOR INSERTION INFINION BOSTON SCIENTIFIC 16 LEAD WITH LAMINECTOMY    His Family History Is Significant For: Family History  Problem Relation Age of Onset  . Multiple myeloma Mother   . Diabetes Mother   . Cancer Sister     lymphoma  . Diabetes Sister   . Stroke Neg Hx     His Social History Is Significant For: Social History   Social History  . Marital  Status: Married    Spouse Name: Vaughan Basta  . Number of Children: 3  . Years of Education: 12   Occupational History  .      retired    Social History Main Topics  . Smoking status: Current Every Day Smoker -- 1.00 packs/day for 60 years  . Smokeless tobacco: Never Used  . Alcohol Use: No  . Drug Use: No  . Sexual Activity: Not Asked   Other Topics Concern  . None   Social History Narrative   Patient is right handed   Patient resides with wife,consumes 2-3 cups of caffeine daily    His Allergies Are:  Allergies  Allergen Reactions  . Codeine     Itch   :   His Current Medications Are:  Outpatient Encounter Prescriptions as of 04/13/2015  Medication Sig  . ACCU-CHEK FASTCLIX LANCETS MISC Check blood sugar once daily and as directed. Dx E11.9  . Bisacodyl (LAXATIVE PO) Take 5 mg by mouth daily as needed (constipation). Biscodyl  . Blood Glucose Monitoring Suppl (ACCU-CHEK NANO SMARTVIEW) W/DEVICE KIT Check blood sugar once daily and as directed. Dx E11.9  . digoxin (LANOXIN) 0.125 MG tablet TAKE 1 TABLET (0.125 MG TOTAL) BY MOUTH DAILY.  . divalproex (DEPAKOTE ER) 500 MG 24 hr tablet Take 2 tablets (1,000 mg total) by mouth daily.  Marland Kitchen donepezil (ARICEPT) 10 MG tablet Take 1 tablet (10 mg total) by mouth every morning.  Marland Kitchen FLUoxetine (PROZAC) 20 MG capsule Take 1 capsule (20 mg total) by mouth daily.  Marland Kitchen glucose blood (ACCU-CHEK SMARTVIEW) test strip Check blood sugar once daily and as directed. Dx E11.9  . HYDROcodone-acetaminophen (NORCO) 7.5-325 MG per tablet Take 1 tablet by mouth every 6 (six) hours as needed for moderate pain.  Marland Kitchen neomycin-bacitracin-polymyxin (NEOSPORIN) [phone] ointment Apply 1 application topically as needed (wound).  . nitroGLYCERIN (NITROSTAT) 0.4 MG SL tablet Place 1 tablet (0.4 mg total) under the tongue every 5 (five) minutes as needed for chest pain.  . simvastatin (ZOCOR) 40 MG tablet Take 1 tablet (40 mg total) by mouth daily at 6 PM.  . tiZANidine (ZANAFLEX) 4 MG tablet Take 4 mg by mouth every 6 (six) hours as needed for muscle spasms. As needed  . traZODone (DESYREL) 100 MG  tablet Take 2 tablets (200 mg total) by mouth at bedtime. (Patient taking differently: Take 100 mg by mouth at bedtime. )   No facility-administered encounter medications on file as of 04/13/2015.  :  Review of Systems:  Out of a complete 14 point review of systems, all are reviewed and negative with the exception of these symptoms as listed below:  Review of Systems  Neurological: Positive for headaches.       Patient reports having more headaches. Wife is concerned about patient being more unsteady and "stumbling around".     Objective:  Neurologic Exam  Physical Exam Physical Examination:   Filed Vitals:   04/13/15 1116  BP: 120/62  Pulse: 62  Resp: 18    General Examination: The patient is a very pleasant 69 y.o. male in no acute distress. He appears frail and thinner than before and is adequately groomed. He is pleasant and conversant, less frustrated appearing today.  HEENT exam: Normocephalic, atraumatic, pupils are equal, round and reactive to light and accommodation. Extraocular tracking is mildly impaired. He is hard of hearing, which is unchanged. Funduscopic exam is unremarkable. Neck is supple but there is decrease in range of motion passively. He is status  post 2 neck surgeries. He has no facial masking and speech is clear but scant. He has a slight head tremor. Oropharynx exam shows moderate mouth dryness. Tongue protrudes centrally and palate elevates symmetrically.  Chest is clear to auscultation without wheezing, rhonchi or crackles noted.  Heart sounds are normal without murmurs, rubs or gallops.  Abdomen is soft, nontender with normal bowel sounds appreciated. Skin is dry.  He does not have any obvious joint deformities but complains of  mid back pain and bilateral foot pain. He has a bony prominence across the right outer metatarsals. He says he had an injury in the past. He had left knee replacement surgery.  Neurologically: Mental status: The patient is awake,  alert and oriented to self, circumstance.   In 1/15: MMSE 23/30.   On 04/11/2014: MMSE: 26/30, CDT: 4/4/, AFT: 11. Geriatric depression score: 9/15.    On 10/11/2014: MMSE: 26/30, CDT: 4/4, AFT: 12, GDS: 12/15.  On 04/13/2015: MMSE: 25/30, CDT: 4/4, AFT: 11/min.  Cranial nerves are as described above. Motor exam reveals thinner global bulk and normal tone. He has no drift, or rebound. He is a minimal postural and action tremor in both upper extremities and no resting tremor. Reflexes are 1+ in the upper extremities and absent in the LEs. Cerebellar testing shows no dysmetria or intention tremor. Sensory exam is intact to light touch, but decrease to pinprick in the upper extremities distally, and he has decreased sensation to pinprick, vibration and temperature in the distal lower extremities to mid shin areas bilaterally, unchanged. Romberg is not tested today. His gait shows decreased stride length and pace and walks insecurely, he uses his cane. He turns slowly. His balance is impaired.  Assessment and Plan:   In summary, Mr. Sawin is a very pleasant 69 year old gentleman with a history of degenerative joint disease, neuropathy, chronic back pain and dementia, who presents for follow-up of his gait d/o and memory loss. His memory has been stable, which is reassuring. He has chronic back pain, and now is s/p spinal cord stimulator placement. He had a trial placement in February 2016. He has residual depression. He has been on generic Prozac. He is no longer on Remeron  and no longer on meloxicam. He is on less hydrocodone. He is on trazodone for sleep at night, now 100 mg each night as opposed to 200 mg each night. He still is on Depakote 1 g each night. We will continue with Aricept 10 mg daily generic. He did not need prescription refills today. I asked him to stay active and perhaps he could try a stationary pedaling device when sitting. He is advised to keep well hydrated. I answered all their  questions today and the patient and his family were in agreement.  I spent 20 minutes in total face-to-face time with the patient, more than 50% of which was spent in counseling and coordination of care, reviewing test results, reviewing medication and discussing or reviewing the diagnosis of gait disorder, chronic pain, memory loss, insomnia, the prognosis and treatment options interconnection of all of his symptoms.

## 2015-04-17 DIAGNOSIS — H26492 Other secondary cataract, left eye: Secondary | ICD-10-CM | POA: Diagnosis not present

## 2015-04-17 DIAGNOSIS — Z961 Presence of intraocular lens: Secondary | ICD-10-CM | POA: Diagnosis not present

## 2015-04-19 DIAGNOSIS — M546 Pain in thoracic spine: Secondary | ICD-10-CM | POA: Diagnosis not present

## 2015-04-19 DIAGNOSIS — E1142 Type 2 diabetes mellitus with diabetic polyneuropathy: Secondary | ICD-10-CM | POA: Diagnosis not present

## 2015-04-19 DIAGNOSIS — M545 Low back pain: Secondary | ICD-10-CM | POA: Diagnosis not present

## 2015-04-24 ENCOUNTER — Telehealth: Payer: Self-pay | Admitting: Internal Medicine

## 2015-04-24 NOTE — Telephone Encounter (Signed)
Yes he can get Prevnar

## 2015-04-24 NOTE — Telephone Encounter (Signed)
Spouse called to make flu shot appointment 04/25/15 and wanted to know if he could get new pneumonia shot

## 2015-04-25 ENCOUNTER — Ambulatory Visit (INDEPENDENT_AMBULATORY_CARE_PROVIDER_SITE_OTHER): Payer: Commercial Managed Care - HMO

## 2015-04-25 DIAGNOSIS — Z23 Encounter for immunization: Secondary | ICD-10-CM | POA: Diagnosis not present

## 2015-04-25 NOTE — Telephone Encounter (Signed)
Left detailed msg on VM per HIPAA letting them know a nurse visit can be made to get Prevnar

## 2015-04-26 DIAGNOSIS — H26492 Other secondary cataract, left eye: Secondary | ICD-10-CM | POA: Diagnosis not present

## 2015-04-26 DIAGNOSIS — H2640 Unspecified secondary cataract: Secondary | ICD-10-CM | POA: Diagnosis not present

## 2015-05-10 ENCOUNTER — Other Ambulatory Visit: Payer: Self-pay | Admitting: Internal Medicine

## 2015-05-17 DIAGNOSIS — Z6825 Body mass index (BMI) 25.0-25.9, adult: Secondary | ICD-10-CM | POA: Diagnosis not present

## 2015-05-17 DIAGNOSIS — M546 Pain in thoracic spine: Secondary | ICD-10-CM | POA: Diagnosis not present

## 2015-05-17 DIAGNOSIS — M545 Low back pain: Secondary | ICD-10-CM | POA: Diagnosis not present

## 2015-05-17 DIAGNOSIS — M542 Cervicalgia: Secondary | ICD-10-CM | POA: Diagnosis not present

## 2015-05-21 ENCOUNTER — Other Ambulatory Visit: Payer: Self-pay | Admitting: Cardiology

## 2015-06-09 ENCOUNTER — Encounter: Payer: Self-pay | Admitting: Cardiology

## 2015-06-17 ENCOUNTER — Other Ambulatory Visit: Payer: Self-pay | Admitting: Neurology

## 2015-06-19 DIAGNOSIS — Z85828 Personal history of other malignant neoplasm of skin: Secondary | ICD-10-CM | POA: Diagnosis not present

## 2015-06-19 DIAGNOSIS — H61002 Unspecified perichondritis of left external ear: Secondary | ICD-10-CM | POA: Diagnosis not present

## 2015-06-19 DIAGNOSIS — L820 Inflamed seborrheic keratosis: Secondary | ICD-10-CM | POA: Diagnosis not present

## 2015-06-19 DIAGNOSIS — L218 Other seborrheic dermatitis: Secondary | ICD-10-CM | POA: Diagnosis not present

## 2015-06-19 DIAGNOSIS — L219 Seborrheic dermatitis, unspecified: Secondary | ICD-10-CM | POA: Diagnosis not present

## 2015-06-29 ENCOUNTER — Ambulatory Visit: Payer: Commercial Managed Care - HMO | Admitting: Cardiology

## 2015-07-24 ENCOUNTER — Other Ambulatory Visit: Payer: Self-pay | Admitting: Internal Medicine

## 2015-08-09 DIAGNOSIS — M546 Pain in thoracic spine: Secondary | ICD-10-CM | POA: Diagnosis not present

## 2015-08-09 DIAGNOSIS — M545 Low back pain: Secondary | ICD-10-CM | POA: Diagnosis not present

## 2015-08-09 DIAGNOSIS — M542 Cervicalgia: Secondary | ICD-10-CM | POA: Diagnosis not present

## 2015-08-18 ENCOUNTER — Encounter: Payer: Self-pay | Admitting: Cardiology

## 2015-08-18 ENCOUNTER — Ambulatory Visit (INDEPENDENT_AMBULATORY_CARE_PROVIDER_SITE_OTHER): Payer: Commercial Managed Care - HMO | Admitting: Cardiology

## 2015-08-18 VITALS — BP 130/70 | HR 70 | Ht 72.0 in | Wt 180.4 lb

## 2015-08-18 DIAGNOSIS — E7800 Pure hypercholesterolemia, unspecified: Secondary | ICD-10-CM | POA: Diagnosis not present

## 2015-08-18 DIAGNOSIS — F0390 Unspecified dementia without behavioral disturbance: Secondary | ICD-10-CM

## 2015-08-18 NOTE — Patient Instructions (Addendum)
Medication Instructions:    STOP TAKING DIGOXIN   If you need a refill on your cardiac medications before your next appointment, please call your pharmacy.  Labwork:  NONE ORDER TODAY    Testing/Procedures:  NONE ORDER TODAY    Follow-Up:  AS NEEDED FOR  ANY CARDIAC RELATED SYMPTOMS    Any Other Special Instructions Will Be Listed Below (If Applicable).

## 2015-08-18 NOTE — Progress Notes (Signed)
Cardiology Office Note   Date:  08/18/2015   ID:  Alexander Booth, Alexander Booth [birth year], MRN [id number]  PCP:  Webb Silversmith, NP  Cardiologist: Darlin Coco MD  Chief Complaint  Patient presents with  . 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 male who presents for scheduled follow-up visit  This pleasant 70 year old gentleman has a history of hypertension and hypercholesterolemia. The patient also has a history of dementia and a history of diabetes mellitus. His last visit he has not had any new cardiac symptoms. He denies any chest pain or shortness of breath. He has a history of COPD. He continues to smoke a pack of cigarettes a day.  He has chronic back pain.  He has had an implanted spinal nerve stimulator from Dr. Vertell Limber.  However he states that the stimulator has not really helped his pain much.  He goes to a pain clinic.  He is on hydrocodone. The patient complains of urinary dribbling and incontinence and weak stream.  He has not seen a urologist.  I suggested that he be checked by a urologist. The patient has a history of hypercholesterolemia.  His lipids are followed by his primary care provider. He has been on long-term digoxin 0.125 mg daily for many years.  I believe it was initially given because of symptomatic PVCs and palpitations.  There is no clear cut indication to continue the digoxin and we will stop it at this time.   Past Medical History  Diagnosis Date  . Hypertension     Essential  . Hypercholesterolemia   . Lumbosacral disc disease     with chronic low back pain  . Bronchitis     Subacute and hx of ongoing cigarette smoking one half pack daily  . Diabetes mellitus     He's also had a hx of high cholesterol   . FHx: heart disease     Positive for   . Dyslipidemia     He also has a past hx of   . Type 2 diabetes mellitus ([place]) 2010    takes  oral meds  . Fracture of lumbar vertebra ([place]) 11/25/2012  . Hard of hearing     very little hearing on left side;better in right ear  . Erectile dysfunction     He's had a hx of   . Depression   . Presenile dementia   . Dementia     He has a hx of the   . Peripheral neuropathy ([place])   . Peripheral neuropathy [facility])     He also has   . Numbness and tingling in hands     and feet  . Anginal pain [facility])     Past Surgical History  Procedure Laterality Date  . Knee surgery      Previous left total replacement  . Hemorrhoid surgery      x2  . Other surgical history  1992    Bone Fusion in Right Foot  . Anterior cervical decomp/discectomy fusion    . Appendectomy    . Joint replacement Left     2nd one was to change siZe  . Total knee arthroplasty Left   . Kyphoplasty N/A 12/10/2012    Procedure: Lumbar one Kyphoplasty;  Surgeon: Kristeen Miss, MD;  Location: [facility];  Service: Neurosurgery;  Laterality: N/A;  Lumbar One Kyphoplasty  .  Spinal cord stimulator insertion N/A 12/02/2014    Procedure: THORACIC SPINAL CORD STIMULATOR INSERTION INFINION BOSTON [employer] 16 LEAD WITH LAMINECTOMY;  Surgeon: Erline Levine, MD;  Location: [facility];  Service: Neurosurgery;  Laterality: N/A;  THORACIC SPINAL CORD STIMULATOR INSERTION INFINION BOSTON [employer]ENT[employer] 16 LEAD WITH LAMINECTOMY     Current Outpatient Prescriptions  Medication Sig Dispense Refill  . ACCU-CHEK FASTCLIX LANCETS MISC CHECK BLOOD SUGAR ONCE DAILY AND AS DIRECTED 102 each 5  . ACCU-CHEK SMARTVIEW test strip CHECK BLOOD SUGAR ONCE DAILY AND AS DIRECTED 50 each 5  . bisacodyl (BISACODYL) 5 MG EC tablet Take 5 mg by mouth daily as needed for mild constipation or moderate constipation.    . Bisacodyl (LAXATIVE PO) Take 5 mg by mouth daily as needed (constipation). Biscodyl    . Blood Glucose Monitoring Suppl (ACCU-CHEK NANO SMARTVIEW) W/DEVICE KIT CHECK BLOOD SUGAR ONCE DAILY AND AS DIRECTED. 1 kit 0  . celecoxib (CELEBREX)  200 MG capsule Take 200 mg by mouth daily as needed. Back pain  2  . divalproex (DEPAKOTE ER) 500 MG 24 hr tablet Take 2 tablets (1,000 mg total) by mouth daily. 180 tablet 3  . donepezil (ARICEPT) 10 MG tablet Take 1 tablet (10 mg total) by mouth every morning. 90 tablet 3  . FLUoxetine (PROZAC) 20 MG capsule TAKE 1 CAPSULE EVERY DAY 90 capsule 1  . HYDROcodone-acetaminophen (NORCO) 7.5-325 MG per tablet Take 1 tablet by mouth every 6 (six) hours as needed for moderate pain. 30 tablet 0  . ketoconazole (NIZORAL) 2 % shampoo Apply 1 application topically 2 (two) times a week. scalp  2  . neomycin-bacitracin-polymyxin (NEOSPORIN) [phone] ointment Apply 1 application topically as needed (wound).    . nitroGLYCERIN (NITROSTAT) 0.4 MG SL tablet Place 1 tablet (0.4 mg total) under the tongue every 5 (five) minutes as needed for chest pain. 25 tablet prn  . simvastatin (ZOCOR) 40 MG tablet Take 1 tablet (40 mg total) by mouth daily at 6 PM. 30 tablet 6  . tiZANidine (ZANAFLEX) 4 MG tablet Take 4 mg by mouth every 6 (six) hours as needed for muscle spasms. As needed    . traZODone (DESYREL) 100 MG tablet Take 2 tablets (200 mg total) by mouth at bedtime. (Patient taking differently: Take 100 mg by mouth at bedtime. ) 180 tablet 3  . [DISCONTINUED] sildenafil (VIAGRA) 50 MG tablet Take 1 tablet (50 mg total) by mouth as needed. 5 tablet 3   No current facility-administered medications for this visit.    Allergies:   Codeine    Social History:  The patient  reports that he has been smoking.  He has never used smokeless tobacco. He reports that he does not drink alcohol or use illicit drugs.   Family History:  The patient's family history includes Cancer in his sister; Diabetes in his mother and sister; Multiple myeloma in his mother. There is no history of Stroke.    ROS:  Please see the history of present illness.   Otherwise, review of systems are positive for none.   All other systems are  reviewed and negative.    PHYSICAL EXAM: VS:  BP 130/70 mmHg  Pulse 70  Ht 6' (1.829 m)  Wt 180 lb 6.4 oz (81.829 kg)  BMI 24.46 kg/m2 , BMI Body mass index is 24.46 kg/(m^2). GEN: Well nourished, well developed, in no acute distress HEENT: normal Neck: no JVD, carotid bruits, or masses Cardiac: RRR; no murmurs, rubs, or gallops,no  edema  Respiratory:  clear to auscultation bilaterally, normal work of breathing GI: soft, nontender, nondistended, + BS MS: no deformity or atrophy Skin: warm and dry, no rash Neuro:  Strength and sensation are intact Psych: euthymic mood, full affect   EKG:  EKG is not ordered today.    Recent Labs: 10/25/2014: ALT 11 11/23/2014: BUN 8; Creatinine, Ser 0.92; Hemoglobin 13.7; Platelets 147*; Potassium 4.1; Sodium 139    Lipid Panel    Component Value Date/Time   CHOL 164 10/25/2014 1515   TRIG 226.0* 10/25/2014 1515   HDL 31.90* 10/25/2014 1515   CHOLHDL 5 10/25/2014 1515   VLDL 45.2* 10/25/2014 1515   LDLCALC 66 10/26/2013 1406   LDLDIRECT 85.0 10/25/2014 1515      Wt Readings from Last 3 Encounters:  08/18/15 180 lb 6.4 oz (81.829 kg)  04/13/15 178 lb (80.74 kg)  12/02/14 183 lb 1.6 oz (83.054 kg)         ASSESSMENT AND PLAN:  1. Essential hypertension 2. Hypercholesterolemia followed by his PCP 3. COPD and ongoing tobacco abuse 4. Dementia 5. Intractable low back pain 6.  Urinary incontinence.  He will consult neurology  Current medicines are reviewed at length with the patient today.  The patient does not have concerns regarding medicines.  The following changes have been made:  Stop digoxin  Labs/ tests ordered today include:  No orders of the defined types were placed in this encounter.     Disposition:   Stop digoxin. Following my retirement he will not need to be followed by cardiology.  Continue follow-up for his medical problems with his PCP.  Return here when necessary.  Berna Spare  MD 08/18/2015 5:01 PM    [facility] [address], [place], [place]  [address] Phone: [phone]; Fax: [phone]

## 2015-08-23 ENCOUNTER — Other Ambulatory Visit: Payer: Self-pay | Admitting: Neurology

## 2015-10-10 ENCOUNTER — Encounter: Payer: Self-pay | Admitting: Neurology

## 2015-10-10 ENCOUNTER — Ambulatory Visit (INDEPENDENT_AMBULATORY_CARE_PROVIDER_SITE_OTHER): Payer: Commercial Managed Care - HMO | Admitting: Neurology

## 2015-10-10 VITALS — BP 158/70 | HR 65 | Resp 22 | Ht 73.0 in | Wt 174.0 lb

## 2015-10-10 DIAGNOSIS — F0391 Unspecified dementia with behavioral disturbance: Secondary | ICD-10-CM | POA: Diagnosis not present

## 2015-10-10 DIAGNOSIS — G8929 Other chronic pain: Secondary | ICD-10-CM | POA: Diagnosis not present

## 2015-10-10 DIAGNOSIS — F419 Anxiety disorder, unspecified: Secondary | ICD-10-CM

## 2015-10-10 DIAGNOSIS — G4700 Insomnia, unspecified: Secondary | ICD-10-CM

## 2015-10-10 DIAGNOSIS — F03918 Unspecified dementia, unspecified severity, with other behavioral disturbance: Secondary | ICD-10-CM

## 2015-10-10 DIAGNOSIS — Z658 Other specified problems related to psychosocial circumstances: Secondary | ICD-10-CM

## 2015-10-10 DIAGNOSIS — F32A Depression, unspecified: Secondary | ICD-10-CM

## 2015-10-10 DIAGNOSIS — F439 Reaction to severe stress, unspecified: Secondary | ICD-10-CM

## 2015-10-10 DIAGNOSIS — F329 Major depressive disorder, single episode, unspecified: Secondary | ICD-10-CM

## 2015-10-10 MED ORDER — DONEPEZIL HCL 10 MG PO TABS: 10.0000 mg | ORAL_TABLET | Freq: Every morning | ORAL | Status: DC

## 2015-10-10 MED ORDER — FLUOXETINE HCL 20 MG PO CAPS: 20.0000 mg | ORAL_CAPSULE | Freq: Every day | ORAL | Status: DC

## 2015-10-10 MED ORDER — TRAZODONE HCL 100 MG PO TABS: 100.0000 mg | ORAL_TABLET | Freq: Every day | ORAL | Status: DC

## 2015-10-10 MED ORDER — MEMANTINE HCL ER 7 MG PO CP24: 7.0000 mg | ORAL_CAPSULE | Freq: Every morning | ORAL | Status: DC

## 2015-10-10 NOTE — Patient Instructions (Signed)
I would like to suggest starting you on a second memory medication, Namenda XR, starting at 7 mg once daily with gradual build up. Side effects include: nausea, confusion, hallucination, personality changes. If you are having mild side effects, try to stick with the treatment as these initial side effects may go away after the first 10-14 days.     We will continue with the donepezil.  We will continue with the Prozac and the Depakote, and trazodone.  I will make a referral to psychiatry.   Try over the counter ear drops to loosen the wax on the right side.

## 2015-10-10 NOTE — Progress Notes (Signed)
Subjective:    Patient ID: Alexander Booth is a 70 y.o. male.  HPI     Interim history:   Mr. Alexander Booth is a very pleasant 70 year old gentleman with an underlying medical history of hypertension, diabetes, hyperlipidemia, hearing loss, smoking, and degenerative neck disease, status post neck surgery in October 2012 with ACDF, who presents for followup consultation of his memory loss and gait disorder. He is accompanied by his daughter, Alexander Booth, today. I last saw him on 04/13/2015, at which time he reported bilateral leg pain and foot pain.Marland Kitchen He also had upper back and low back pain. He had a spinal cord stimulator placed under Dr. Vertell Limber on 12/02/2014. I had reviewed his operative note and discharge summary from that admission. He also complained of muscle spasms in his hamstrings. He was using a cane around the house and sometimes a walker outside but mostly cane outside. He had lost some weight. He was trying to drink enough water. Trazodone was added 100 mg each night. He was off of meloxicam and had been able to reduce his hydrocodone. His MMSE was 25/30, CDT: 4/4, AFT: 11/min at the time, I suggested we continue with Aricept generic 10 mg daily.  Today, 10/10/2015: He reports more stress, anxiety, depression, is tearful. His wife was involved in a car accident some 3 weeks ago and needs a brain scan. According to the daughter, since his wife's accident, he has not been eating and drinking well, he is not resting, he is not sleeping well. He reports hurting all over. He feels like nobody is able to help his pain. He has seen Dr. Vertell Limber, he also follows with pain management, Dr. [name]. Years ago, he has seen a psychiatrist.  Previously:   I saw him on 10/11/2014, at which time he reported a recent fall outside, he slipped off a curb. He did not hit his head. He had a spinal cord stimulator trial in February 2016 and felt it was helpful for his low back pain but not in his upper back pain. He had  more depression. We had tried to reduce his Depakote in the past but he had some problems with it. He was on Aricept generic 10 mg daily. He was still on trazodone 200 mg daily. Chronic pain and severe arthritis pain were still the biggest issue for him. He was on hydrocodone, 7.5 mg twice daily and Mobic 15 mg daily. He had stomach pain when eating.   I saw him on 04/11/2014, at which time he had recently undergone a steroid injection into his lower back which helped for a few days. He had some medication changes. He had been on tramadol, and got agitated when his Depakote was reduced. He was no longer on hydrocodone or mobic. He had fallen 4 or 5 times. No loss of consciousness or head laceration was reported. He was carrying buckets of water and was still trying to be active in the yard. Most of his falls occurred outside. I ordered a [place] wo contrast due to recurrent falls. He had this on 04/19/2014: Mildly abnormal CT head (without) demonstrating:  1. Mild chronic small vessel ischemic disease, in the right caudate, right frontal juxtacortical, and left frontal periventricular regions. 2. No acute findings. 3. No change from MRI on 12/03/12. In addition, personally reviewed the images through the PACS system. We called his family with his test results.  I saw him on 12/09/2013, at which time he was primarily complaining of chronic pain in his lower  back. He was seeing a pain specialist and had undergone steroid epidural injections. We talked about reducing his Depakote. He was advised to use his cane consistently and we talked about fall precautions again.  I saw him on 07/27/13, at which time we talked about fall safety, and I expressed concern regarding sedating medications that may contribute to his falls. His daughter reported 2 recent falls. I suggested we reduce his trazodone to total of 150 mg each night and his Depakote ER from 1000 mg to 500 mg. We talked about his back pain and he was supposed  to see his neurosurgeon again and I advised him to discuss physical therapy and pain management with Dr. Ellene Route. Of note, Alexander Booth talked to me in private without patient's knowledge, per her request, and she mentions, that the patient's father died recently and there is a dispute regarding the inheritance and the house. It turns out, the father was not his real father, and his he has 2 half-siblings. The patient has a son and another daughter, and the son has been pressuring the patient to keep going to the attorney. His daughter becomes tearful as she relates to me how much stress with her family has been under recently and she feels that her father is not in any shape to endure this kind of stress. She is particularly worried about his stroke risk under pressure. She wants his son which is her brother to not bother him with the attorney issues. There have been clashes between the patient and his half sibling since his father died who turned out to be his stepfather. His mother died over 70 years ago from [place] and there is really no one who can tell them about his real father. His wife has had to take care of her mother and has been staying with her mother.    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 He had a recent L1 compression fracture and had to have surgery for this. He had a fall with injury recently while working on his roof despite my advice not to climb on roofs or climb on ladders. I advised him to start using a rolling walker with sliders in the back because of risk for falling and worsening of his gait which certainly took a setback after his fall. He had a repeat lumbar spine MRI on 07/17/2013: Interval L1-2 fusion. The central canal and foramina are open. No evidence of complication from surgery. The patient's examination is otherwise unchanged. No change in extra foraminal disc bulging  on the left at L4-5 contacting the exited left L4 root. No change in a right lateral recess narrowing at L5-S1 with mild encroachment on the descending right S1 root. There is also mild right foraminal narrowing at this level.   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 I discussed his recent brain MRI and lumbar spine MRI results at the time. He had evidence of chronic white matter changes and we talked about prevention of cardiovascular disease at the time. He had stopped taking his metformin about 2-3 weeks prior to his June visit with me. He was advised to continue to measure his capillary blood glucose levels. I felt that his gait disorder and balance problems for most likely multifactorial in nature, due to chronic back pain, neuropathy and atherosclerotic  changes. He had a fall with injury when he had climbed on the roof despite my advice not to climb on any heights. He was re-educated in that regard and also advised not to use any heavy machinery. He also has a history of significant hearing loss.   He previously followed with Dr. Morene Antu since 2004.    I first met him on 09/29/2012 at which time I kept him on the same medications. He was advised to get a repeat brain MRI.   Neuropsychological testing was done in December 2004 which showed full-scale IQ of 37, verbal IQ of 62, a performance IQ of 17. MMSE was in the 28 range. MRI brain in 2004 was normal, a PET scan was positive for Alzheimer's, with normal TSH, RPR, HIV, B12 and folate. He did have a borderline low B12. He had a normal methylmalonic acid level. Sleep study showed mild to moderate OSA but he could not tolerate CPAP. He has been on Aricept since March 2005. He has had burning foot pain for which gabapentin and Lyrica were tried without help. In March 2013 his MMSE was 24, clock drawing was 4, and normal fluency was 11. In January his MMSE was 23,  clock drawing was 4, animal fluency was 9. His brain MRI showed a nonspecific posterior right parietal lobe lesion without contrast uptake or edema. A followup MRI was recommended. He had a brain MRI with and without contrast on 12/03/12, which showed nonspecific WM changes.    His Past Medical History Is Significant For: Past Medical History  Diagnosis Date  . Hypertension     Essential  . Hypercholesterolemia   . Lumbosacral disc disease     with chronic low back pain  . Bronchitis     Subacute and hx of ongoing cigarette smoking one half pack daily  . Diabetes mellitus     He's also had a hx of high cholesterol   . FHx: heart disease     Positive for   . Dyslipidemia     He also has a past hx of   . Type 2 diabetes mellitus ([place]) 2010    takes oral meds  . Fracture of lumbar vertebra ([place]) 11/25/2012  . Hard of hearing     very little hearing on left side;better in right ear  . Erectile dysfunction     He's had a hx of   . Depression   . Presenile dementia   . Dementia     He has a hx of the   . Peripheral neuropathy ([place])   . Peripheral neuropathy [facility])     He also has   . Numbness and tingling in hands     and feet  . Anginal pain ([place])     His Past Surgical History Is Significant For: Past Surgical History  Procedure Laterality Date  . Knee surgery      Previous left total replacement  . Hemorrhoid surgery      x2  . Other surgical history  1992    Bone Fusion in Right Foot  . Anterior cervical decomp/discectomy fusion    . Appendectomy    . Joint replacement Left     2nd one was to change siZe  . Total knee arthroplasty Left   . Kyphoplasty N/A 12/10/2012    Procedure: Lumbar one Kyphoplasty;  Surgeon: Kristeen Miss, MD;  Location: [facility];  Service: Neurosurgery;  Laterality: N/A;  Lumbar One Kyphoplasty  . Spinal cord  stimulator insertion N/A 12/02/2014    Procedure: THORACIC SPINAL CORD STIMULATOR INSERTION INFINION BOSTON SCIENTIFIC 16 LEAD WITH  LAMINECTOMY;  Surgeon: Erline Levine, MD;  Location: [facility];  Service: Neurosurgery;  Laterality: N/A;  THORACIC SPINAL CORD STIMULATOR INSERTION INFINION BOSTON SCIENTIFIC 16 LEAD WITH LAMINECTOMY    His Family History Is Significant For: Family History  Problem Relation Age of Onset  . Multiple myeloma Mother   . Diabetes Mother   . Cancer Sister     lymphoma  . Diabetes Sister   . Stroke Neg Hx     His Social History Is Significant For: Social History   Social History  . Marital Status: Married    Spouse Name: Vaughan Basta  . Number of Children: 3  . Years of Education: 12   Occupational History  .      retired   Social History Main Topics  . Smoking status: Current Every Day Smoker -- 1.00 packs/day for 60 years  . Smokeless tobacco: Never Used  . Alcohol Use: No  . Drug Use: No  . Sexual Activity: Not Asked   Other Topics Concern  . None   Social History Narrative   Patient is right handed   Patient resides with wife,consumes 2-3 cups of caffeine daily    His Allergies Are:  Allergies  Allergen Reactions  . Codeine     Itch   :   His Current Medications Are:  Outpatient Encounter Prescriptions as of 10/10/2015  Medication Sig  . ACCU-CHEK FASTCLIX LANCETS MISC CHECK BLOOD SUGAR ONCE DAILY AND AS DIRECTED  . ACCU-CHEK SMARTVIEW test strip CHECK BLOOD SUGAR ONCE DAILY AND AS DIRECTED  . bisacodyl (BISACODYL) 5 MG EC tablet Take 5 mg by mouth daily as needed for mild constipation or moderate constipation.  . Bisacodyl (LAXATIVE PO) Take 5 mg by mouth daily as needed (constipation). Biscodyl  . Blood Glucose Monitoring Suppl (ACCU-CHEK NANO SMARTVIEW) W/DEVICE KIT CHECK BLOOD SUGAR ONCE DAILY AND AS DIRECTED.  Marland Kitchen celecoxib (CELEBREX) 200 MG capsule Take 200 mg by mouth daily as needed. Back pain  . divalproex (DEPAKOTE ER) 500 MG 24 hr tablet TAKE 2 TABLETS EVERY DAY  . donepezil (ARICEPT) 10 MG tablet Take 1 tablet (10 mg total) by mouth every morning.  Marland Kitchen  FLUoxetine (PROZAC) 20 MG capsule TAKE 1 CAPSULE EVERY DAY  . HYDROcodone-acetaminophen (NORCO) 7.5-325 MG per tablet Take 1 tablet by mouth every 6 (six) hours as needed for moderate pain.  Marland Kitchen ketoconazole (NIZORAL) 2 % shampoo Apply 1 application topically 2 (two) times a week. scalp  . metFORMIN (GLUCOPHAGE) 500 MG tablet Take by mouth 2 (two) times daily with a meal.  . neomycin-bacitracin-polymyxin (NEOSPORIN) [phone] ointment Apply 1 application topically as needed (wound).  . nitroGLYCERIN (NITROSTAT) 0.4 MG SL tablet Place 1 tablet (0.4 mg total) under the tongue every 5 (five) minutes as needed for chest pain.  . simvastatin (ZOCOR) 40 MG tablet Take 1 tablet (40 mg total) by mouth daily at 6 PM.  . tiZANidine (ZANAFLEX) 4 MG tablet Take 4 mg by mouth every 6 (six) hours as needed for muscle spasms. As needed  . topiramate (TOPAMAX) 50 MG tablet Take 50 mg by mouth 2 (two) times daily.  . traZODone (DESYREL) 100 MG tablet Take 2 tablets (200 mg total) by mouth at bedtime. (Patient taking differently: Take 100 mg by mouth at bedtime. )   No facility-administered encounter medications on file as of 10/10/2015.  :  Review  of Systems:  Out of a complete 14 point review of systems, all are reviewed and negative with the exception of these symptoms as listed below:   Review of Systems  HENT:       Patient reports that he has had some ear pain.   Neurological:       Daughter reports that patient has been more anxious lately.  Needs refill of Prozac and Trazodone.     Objective:  Neurologic Exam  Physical Exam Physical Examination:   Filed Vitals:   10/10/15 0953  BP: 158/70  Pulse: 65  Resp: 22    General Examination: The patient is a very pleasant 70 y.o. male in no acute distress. He appears frail and thinner than before and is adequately groomed. He is tearful today, noise breathing, almost to the point of hyperventilating at times, better towards the end of appointment.  He complains of hurting all over. He complains that nobody can help his pain.   HEENT exam: Normocephalic, atraumatic, pupils are equal, round and reactive to light and accommodation. Extraocular tracking is mildly impaired. He is hard of hearing, which is unchanged. Funduscopic exam is unremarkable. Neck is supple but there is decrease in range of motion passively. He is status post 2 neck surgeries. He has no facial masking and speech is clear but scant. He has a slight head tremor. Oropharynx exam shows moderate mouth dryness. Tongue protrudes centrally and palate elevates symmetrically.   Chest is clear to auscultation without wheezing, rhonchi or crackles noted.   Heart sounds are normal without murmurs, rubs or gallops.   Abdomen is soft, nontender with normal bowel sounds appreciated. Skin is dry.   He does not have any obvious joint deformities but complains of  mid back pain and bilateral foot pain, neck pain. He has a bony prominence across the right outer metatarsals. He says he had an injury in the past. He had left knee replacement surgery.   Neurologically: Mental status: The patient is awake, alert and oriented to self, circumstance.   In 1/15: MMSE 23/30.   On 04/11/2014: MMSE: 26/30, CDT: 4/4/, AFT: 11. Geriatric depression score: 9/15.    On 10/11/2014: MMSE: 26/30, CDT: 4/4, AFT: 12, GDS: 12/15.  On 04/13/2015: MMSE: 25/30, CDT: 4/4, AFT: 11/min.  On 10/10/2015: MMSE: 18/30, CDT: 3/4, AFT: 8/min. He appears easily frustrated, not making full effort.  Cranial nerves are as described above. Motor exam reveals thinner global bulk and normal tone. He has no drift, or rebound. He is a minimal postural and action tremor in both upper extremities and no resting tremor. Reflexes are 1+ in the upper extremities and absent in the LEs. Cerebellar testing shows no dysmetria or intention tremor. Sensory exam is intact to light touch, but decrease to pinprick in the upper extremities  distally, and he has decreased sensation to pinprick, vibration and temperature in the distal lower extremities to mid shin areas bilaterally, unchanged. Romberg is not testable as he cannot stand unsupported. He stands up slowly has to push himself up. His posture is mildly stooped. He walks with a single-point cane, insecurely, turns slowly, balance is impaired. His balance is impaired.  Assessment and Plan:   In summary, Mr. Ballengee is a very pleasant 70 year old gentleman with a history of degenerative joint disease, neuropathy, chronic back pain and Memory loss, who presents for follow-up consultation of his gait disorder and dementia with prior history of behavioral disturbance. He has had issues with his sleep, and mood  related issues including depression and anxiety. He also has chronic back pain, now status post spinal cord stimulator after a trial in February 2016. He has been on Prozac for depression. However, his anxiety and depression have flared up, he is tearful today. he has had more stress. His wife was involved in a car accident was rear-ended, had some loss of consciousness according to the daughter and needs further workup, including brain scan. He worries a lot, he is under a lot of stress and has not been eating and drinking very well because of all of this.  He's no longer on Remeron. He has been able to reduce trazodone to 100 mg each night. He is still on Depakote, thousand milligrams each night. His memory scores have been stable for the past couple of visits, worse today, but he seems more frustrated and distracted and tearful today and am not sure that this is an actual decline in his memory versus lack of effort and more frustration, overshadowed by depression and anxiety. I renewed his prescription for trazodone, Prozac and Aricept today. However, at this juncture, I suggested we involved geriatric psychiatry for further management of his mood disorder. I discussed this with the  patient and his daughter and they're agreeable. To that end, I will make a referral. We will continue with generic donepezil 10 mg once daily for his dementia, but I think at this juncture, we should initiate a second medication as it does take time to be titrated, I would like for him to start Namenda long-acting 7 mg once daily.  we will titrate slowly as he is somewhat sensitive to medications.  I asked him to stay active physically, of course within his physical limitations. He is also advised to keep well-hydrated and well-nourished. I would like to see him back in 3 months, sooner if the need arises. I answered all their questions today and the patient and his daughter were in agreement.  I spent 40 minutes in total face-to-face time with the patient, more than 50% of which was spent in counseling and coordination of care, reviewing test results, reviewing medication and discussing or reviewing the diagnosis of gait disorder, chronic pain, memory loss, insomnia, the prognosis and treatment options interconnection of all of his symptoms.

## 2015-10-19 ENCOUNTER — Encounter: Payer: Self-pay | Admitting: Internal Medicine

## 2015-10-19 ENCOUNTER — Ambulatory Visit (INDEPENDENT_AMBULATORY_CARE_PROVIDER_SITE_OTHER): Payer: Commercial Managed Care - HMO | Admitting: Internal Medicine

## 2015-10-19 VITALS — BP 138/68 | HR 81 | Temp 98.2°F | Wt 175.0 lb

## 2015-10-19 DIAGNOSIS — H6121 Impacted cerumen, right ear: Secondary | ICD-10-CM

## 2015-10-19 DIAGNOSIS — Z23 Encounter for immunization: Secondary | ICD-10-CM | POA: Diagnosis not present

## 2015-10-19 DIAGNOSIS — J438 Other emphysema: Secondary | ICD-10-CM

## 2015-10-19 DIAGNOSIS — J309 Allergic rhinitis, unspecified: Secondary | ICD-10-CM

## 2015-10-19 DIAGNOSIS — M549 Dorsalgia, unspecified: Secondary | ICD-10-CM

## 2015-10-19 DIAGNOSIS — I10 Essential (primary) hypertension: Secondary | ICD-10-CM

## 2015-10-19 DIAGNOSIS — G479 Sleep disorder, unspecified: Secondary | ICD-10-CM

## 2015-10-19 DIAGNOSIS — E1142 Type 2 diabetes mellitus with diabetic polyneuropathy: Secondary | ICD-10-CM | POA: Diagnosis not present

## 2015-10-19 DIAGNOSIS — F0390 Unspecified dementia without behavioral disturbance: Secondary | ICD-10-CM

## 2015-10-19 DIAGNOSIS — G8929 Other chronic pain: Secondary | ICD-10-CM

## 2015-10-19 DIAGNOSIS — N50819 Testicular pain, unspecified: Secondary | ICD-10-CM

## 2015-10-19 DIAGNOSIS — M1990 Unspecified osteoarthritis, unspecified site: Secondary | ICD-10-CM

## 2015-10-19 DIAGNOSIS — E785 Hyperlipidemia, unspecified: Secondary | ICD-10-CM | POA: Diagnosis not present

## 2015-10-19 DIAGNOSIS — E7800 Pure hypercholesterolemia, unspecified: Secondary | ICD-10-CM

## 2015-10-19 LAB — COMPREHENSIVE METABOLIC PANEL
ALT: 15 U/L (ref 0–53)
AST: 18 U/L (ref 0–37)
Albumin: 4.1 g/dL (ref 3.5–5.2)
Alkaline Phosphatase: 42 U/L (ref 39–117)
BUN: 12 mg/dL (ref 6–23)
CHLORIDE: 105 meq/L (ref 96–112)
CO2: 29 meq/L (ref 19–32)
Calcium: 9.5 mg/dL (ref 8.4–10.5)
Creatinine, Ser: 0.98 mg/dL (ref 0.40–1.50)
GFR: 79.91 mL/min (ref >60.00)
GLUCOSE: 108 mg/dL — AB (ref 70–99)
POTASSIUM: 4.3 meq/L (ref 3.5–5.1)
Sodium: 139 mEq/L (ref 135–145)
Total Bilirubin: 0.4 mg/dL (ref 0.2–1.2)
Total Protein: 6.6 g/dL (ref 6.0–8.3)

## 2015-10-19 LAB — LIPID PANEL
CHOL/HDL RATIO: 5
Cholesterol: 155 mg/dL (ref 0–200)
HDL: 34 mg/dL — AB (ref >39.00)
LDL Cholesterol: 83 mg/dL (ref 0–99)
NONHDL: 121.25
Triglycerides: 190 mg/dL — ABNORMAL HIGH (ref 0.0–149.0)
VLDL: 38 mg/dL (ref 0.0–40.0)

## 2015-10-19 LAB — MICROALBUMIN / CREATININE URINE RATIO
CREATININE, U: 100.8 mg/dL
Microalb Creat Ratio: 0.7 mg/g (ref 0.0–30.0)

## 2015-10-19 LAB — CBC
HEMATOCRIT: 40.7 % (ref 39.0–52.0)
HEMOGLOBIN: 14 g/dL (ref 13.0–17.0)
MCHC: 34.4 g/dL (ref 30.0–36.0)
MCV: 97.4 fl (ref 78.0–100.0)
Platelets: 139 10*3/uL — ABNORMAL LOW (ref 150.0–400.0)
RBC: 4.18 Mil/uL — ABNORMAL LOW (ref 4.22–5.81)
RDW: 13.2 % (ref 11.5–15.5)
WBC: 6.2 10*3/uL (ref 4.0–10.5)

## 2015-10-19 LAB — HEMOGLOBIN A1C: Hgb A1c MFr Bld: 6.1 % (ref 4.6–6.5)

## 2015-10-19 NOTE — Assessment & Plan Note (Signed)
Will check CMET and Lipid Profile today Encouraged him to consume a low fat diet Continue Zocor Discuss taking baby ASA daily

## 2015-10-19 NOTE — Assessment & Plan Note (Signed)
Encouraged smoking cessation He declines at this time Consider LDCT scan of lungs, discuss at [facility]

## 2015-10-19 NOTE — Progress Notes (Signed)
Pre visit review using our clinic review tool, if applicable. No additional management support is needed unless otherwise documented below in the visit note. 

## 2015-10-19 NOTE — Assessment & Plan Note (Signed)
He will continue to follow with neurology Will let them know that he is not taking the Namenda Continue Aricept, Depakote and Prozac He is awaiting appt with Geriatric Psych

## 2015-10-19 NOTE — Assessment & Plan Note (Signed)
Continue to follow with Dr. Lovenia Shuck Continue current pain regimen at this time

## 2015-10-19 NOTE — Assessment & Plan Note (Signed)
Continue Trazadone as needed

## 2015-10-19 NOTE — Assessment & Plan Note (Signed)
Controlled off meds He will no longer follow with cardiology Will continue to monitor

## 2015-10-19 NOTE — Patient Instructions (Signed)
Allergic Rhinitis Allergic rhinitis is when the mucous membranes in the nose respond to allergens. Allergens are particles in the air that cause your body to have an allergic reaction. This causes you to release allergic antibodies. Through a chain of events, these eventually cause you to release histamine into the blood stream. Although meant to protect the body, it is this release of histamine that causes your discomfort, such as frequent sneezing, congestion, and an itchy, runny nose.  CAUSES Seasonal allergic rhinitis (hay fever) is caused by pollen allergens that may come from grasses, trees, and weeds. Year-round allergic rhinitis (perennial allergic rhinitis) is caused by allergens such as house dust mites, pet dander, and mold spores. SYMPTOMS  Nasal stuffiness (congestion).  Itchy, runny nose with sneezing and tearing of the eyes. DIAGNOSIS Your health care provider can help you determine the allergen or allergens that trigger your symptoms. If you and your health care provider are unable to determine the allergen, skin or blood testing may be used. Your health care provider will diagnose your condition after taking your health history and performing a physical exam. Your health care provider may assess you for other related conditions, such as asthma, pink eye, or an ear infection. TREATMENT Allergic rhinitis does not have a cure, but it can be controlled by:  Medicines that block allergy symptoms. These may include allergy shots, nasal sprays, and oral antihistamines.  Avoiding the allergen. Hay fever may often be treated with antihistamines in pill or nasal spray forms. Antihistamines block the effects of histamine. There are over-the-counter medicines that may help with nasal congestion and swelling around the eyes. Check with your health care provider before taking or giving this medicine. If avoiding the allergen or the medicine prescribed do not work, there are many new medicines  your health care provider can prescribe. Stronger medicine may be used if initial measures are ineffective. Desensitizing injections can be used if medicine and avoidance does not work. Desensitization is when a patient is given ongoing shots until the body becomes less sensitive to the allergen. Make sure you follow up with your health care provider if problems continue. HOME CARE INSTRUCTIONS It is not possible to completely avoid allergens, but you can reduce your symptoms by taking steps to limit your exposure to them. It helps to know exactly what you are allergic to so that you can avoid your specific triggers. SEEK MEDICAL CARE IF:  You have a fever.  You develop a cough that does not stop easily (persistent).  You have shortness of breath.  You start wheezing.  Symptoms interfere with normal daily activities.   This information is not intended to replace advice given to you by your health care provider. Make sure you discuss any questions you have with your health care provider.   Document Released: 04/02/2001 Document Revised: 07/29/2014 Document Reviewed: 03/15/2013 Elsevier Interactive Patient Education 2016 [employer]  

## 2015-10-19 NOTE — Progress Notes (Signed)
HPI:  Pt presents to the clinic today for follow up of chronic conditions.  HTN: BP today is 138/68. He was following with Dr. Mare Ferrari, who recently retired. Note from 07/2015 reviewed- Digoxin was stopped. ECG from 11/2014 reviewed.  HLD: His last LDL was 85. He denies myalgias on Zocor. He does try to consume a low fat diet.  Dementia without behavioral disturbance: He follows with Dr. Rexene Alberts- note from 10/10/15 He is taking Aricept, Prozac and Depakote. Namenda was added to his regimen, but he reports he is not taking it because he does not think he needs it. He was also referred to geriatric psychiatry for worsening anxiety and depression. He no longer drives. He is capable of all ADL's at this time.  Chronic back pain, secondary to OA: He got a spinal cord stimulator place by Dr. Vertell Limber 11/2014. He reports his back hurts him all the time. He follows with Dr.Harkins. He takes Celebrex, Norco and Zanaflex for pain.  DM 2 with Neuropathy: His last A1C was 5.8%. He is not checking his sugars. He is taking Metformin as prescribed.  He is getting yearly eye exam. Flu and pneumonia shots are UTD. He does have issues with neuropathy in his feet but is not currently taking any medication for it.  Sleep Disturbance: Controlled with Trazadone at night.  Emphysema: He continues to smoke. He denies cough but does have shortness of breath with exertion.   He also c/o headache, facial pressure, runny nose, ear pain and sore throat. This started 1 week ago. He is blowing clear mucous out of his nose. He has a slight cough but non productive. He denies fever, chills or body aches. He has not tried anything OTC for this. He has not had sick contacts that he is aware of.  He also c/o testicular pain. He reports this started 1 year ago. He describes the pain as sore and achy. He denies dysuria, urgency or frequency. He does have some dribbling. He denies redness or swelling of the testicles. He denies any injury to  the area. He has not tried anything OTC for this.  Past Medical History  Diagnosis Date  . Hypertension     Essential  . Hypercholesterolemia   . Lumbosacral disc disease     with chronic low back pain  . Bronchitis     Subacute and hx of ongoing cigarette smoking one half pack daily  . Diabetes mellitus     He's also had a hx of high cholesterol   . FHx: heart disease     Positive for   . Dyslipidemia     He also has a past hx of   . Type 2 diabetes mellitus ([place]) 2010    takes oral meds  . Fracture of lumbar vertebra ([place]) 11/25/2012  . Hard of hearing     very little hearing on left side;better in right ear  . Erectile dysfunction     He's had a hx of   . Depression   . Presenile dementia   . Dementia     He has a hx of the   . Peripheral neuropathy ([place])   . Peripheral neuropathy [facility])     He also has   . Numbness and tingling in hands     and feet  . Anginal pain [facility])     Current Outpatient Prescriptions  Medication Sig Dispense Refill  . ACCU-CHEK FASTCLIX LANCETS MISC CHECK BLOOD SUGAR ONCE DAILY AND AS DIRECTED 102  each 5  . ACCU-CHEK SMARTVIEW test strip CHECK BLOOD SUGAR ONCE DAILY AND AS DIRECTED 50 each 5  . bisacodyl (BISACODYL) 5 MG EC tablet Take 5 mg by mouth daily as needed for mild constipation or moderate constipation.    . Bisacodyl (LAXATIVE PO) Take 5 mg by mouth daily as needed (constipation). Biscodyl    . Blood Glucose Monitoring Suppl (ACCU-CHEK NANO SMARTVIEW) W/DEVICE KIT CHECK BLOOD SUGAR ONCE DAILY AND AS DIRECTED. 1 kit 0  . celecoxib (CELEBREX) 200 MG capsule Take 200 mg by mouth daily as needed. Back pain  2  . divalproex (DEPAKOTE ER) 500 MG 24 hr tablet TAKE 2 TABLETS EVERY DAY 180 tablet 3  . donepezil (ARICEPT) 10 MG tablet Take 1 tablet (10 mg total) by mouth every morning. 90 tablet 3  . FLUoxetine (PROZAC) 20 MG capsule Take 1 capsule (20 mg total) by mouth daily. 90 capsule 3  . HYDROcodone-acetaminophen (NORCO) 7.5-325 MG per  tablet Take 1 tablet by mouth every 6 (six) hours as needed for moderate pain. 30 tablet 0  . ketoconazole (NIZORAL) 2 % shampoo Apply 1 application topically 2 (two) times a week. scalp  2  . memantine (NAMENDA XR) 7 MG CP24 24 hr capsule Take 1 capsule (7 mg total) by mouth every morning. 30 capsule 5  . metFORMIN (GLUCOPHAGE) 500 MG tablet Take by mouth 2 (two) times daily with a meal.    . neomycin-bacitracin-polymyxin (NEOSPORIN) [phone] ointment Apply 1 application topically as needed (wound).    . nitroGLYCERIN (NITROSTAT) 0.4 MG SL tablet Place 1 tablet (0.4 mg total) under the tongue every 5 (five) minutes as needed for chest pain. 25 tablet prn  . simvastatin (ZOCOR) 40 MG tablet Take 1 tablet (40 mg total) by mouth daily at 6 PM. 30 tablet 6  . tiZANidine (ZANAFLEX) 4 MG tablet Take 4 mg by mouth every 6 (six) hours as needed for muscle spasms. As needed    . topiramate (TOPAMAX) 50 MG tablet Take 50 mg by mouth 2 (two) times daily.    . traZODone (DESYREL) 100 MG tablet Take 1 tablet (100 mg total) by mouth at bedtime. 90 tablet 3  . [DISCONTINUED] sildenafil (VIAGRA) 50 MG tablet Take 1 tablet (50 mg total) by mouth as needed. 5 tablet 3   No current facility-administered medications for this visit.    Allergies  Allergen Reactions  . Codeine     Itch     Family History  Problem Relation Age of Onset  . Multiple myeloma Mother   . Diabetes Mother   . Cancer Sister     lymphoma  . Diabetes Sister   . Stroke Neg Hx     Social History   Social History  . Marital Status: Married    Spouse Name: Vaughan Basta  . Number of Children: 3  . Years of Education: 12   Occupational History  .      retired   Social History Main Topics  . Smoking status: Current Every Day Smoker -- 1.00 packs/day for 60 years  . Smokeless tobacco: Never Used  . Alcohol Use: No  . Drug Use: No  . Sexual Activity: Not on file   Other Topics Concern  . Not on file   Social History Narrative    Patient is right handed   Patient resides with wife,consumes 2-3 cups of caffeine daily    Subjective:   Review of Systems:   Constitutional: Pt reports fatigue. Denies fever,  malaise, headache or abrupt weight changes.  HEENT: Pt reports runny nose and sore throat. Denies eye pain, eye redness, ear pain, ringing in the ears, wax buildup, nasal congestion, bloody nose. Respiratory: Pt reports shortness of breath. Denies difficulty breathing, cough or sputum production.   Cardiovascular: Denies chest pain, chest tightness, palpitations or swelling in the hands or feet.  Gastrointestinal: Denies abdominal pain, bloating, constipation, diarrhea or blood in the stool.  GU: Pt reports testicle pain. Denies urgency, frequency, pain with urination, burning sensation, blood in urine, odor or discharge. Musculoskeletal: Pt reports back pain. Denies muscle pain or joint pain and swelling.  Skin: Denies redness, rashes, or ulcercations.  Neurological: Pt reports difficulty with memory and trouble with balance. Denies dizziness, difficulty with speech. Psych: Pt reports anxiety and depression. Pt reports SI/HI.  No other specific complaints in a complete review of systems (except as listed in HPI above).  Objective:  PE:    BP 138/68 mmHg  Pulse 81  Temp(Src) 98.2 F (36.8 C) (Oral)  Wt 175 lb (79.379 kg)  SpO2 99% Wt Readings from Last 3 Encounters:  10/19/15 175 lb (79.379 kg)  10/10/15 174 lb (78.926 kg)  08/18/15 180 lb 6.4 oz (81.829 kg)    General: Appears his stated age, chronically in NAD. Skin: Warm, dry and intact. Fungal infection noted of bilateral thumbnail. HEENT: Head: normal shape and size, no sinus tenderness noted; Eyes: sclera white, no icterus, conjunctiva pink; Left Ear: Tm's gray and intact, normal light reflex; Right Ear: cerumen buildup; Nose: mucosa boggy and moist, septum midline; Throat/Mouth: Teeth present, mucosa pink and moist, no exudate, lesions or  ulcerations noted.  Neck:  Neck supple, trachea midline. No masses, lumps or thyromegaly present.  Cardiovascular: Normal rate and rhythm. S1,S2 noted.  No murmur, rubs or gallops noted. No JVD or BLE edema. No carotid bruits noted. Pulmonary/Chest: Normal effort and positive vesicular breath sounds. No respiratory distress. No wheezes, rales or ronchi noted.  Abdomen: Soft and nontender.  Scrotum: No pain with palpation of the testes. Normal spermatic cord. No mass noted. Musculoskeletal: Gait slow but steady. Neurological: Alert and oriented. Very HOH. Psychiatric: Mood and affect normal. Behavior is normal. Judgment and thought content normal.     BMET    Component Value Date/Time   NA 139 11/23/2014 0850   NA 141 10/11/2014 1122   K 4.1 11/23/2014 0850   CL 102 11/23/2014 0850   CO2 29 11/23/2014 0850   GLUCOSE 162* 11/23/2014 0850   GLUCOSE 86 10/11/2014 1122   BUN 8 11/23/2014 0850   BUN 11 10/11/2014 1122   CREATININE 0.92 11/23/2014 0850   CREATININE 0.72 03/26/2013 1608   CALCIUM 9.3 11/23/2014 0850   GFRNONAA >60 11/23/2014 0850   GFRAA >60 11/23/2014 0850    Lipid Panel     Component Value Date/Time   CHOL 164 10/25/2014 1515   TRIG 226.0* 10/25/2014 1515   HDL 31.90* 10/25/2014 1515   CHOLHDL 5 10/25/2014 1515   VLDL 45.2* 10/25/2014 1515   LDLCALC 66 10/26/2013 1406    CBC    Component Value Date/Time   WBC 6.1 11/23/2014 0850   WBC 6.2 10/11/2014 1122   RBC 4.15* 11/23/2014 0850   RBC 4.40 10/11/2014 1122   HGB 13.7 11/23/2014 0850   HCT 40.9 11/23/2014 0850   PLT 147* 11/23/2014 0850   MCV 98.6 11/23/2014 0850   MCH 33.0 11/23/2014 0850   MCH 33.2* 10/11/2014 1122   MCHC 33.5 11/23/2014  0850   MCHC 34.6 10/11/2014 1122   RDW 12.4 11/23/2014 0850   RDW 13.5 10/11/2014 1122   LYMPHSABS 1.3 10/11/2014 1122   EOSABS 0.0 10/11/2014 1122   BASOSABS 0.0 10/11/2014 1122    Hgb A1C Lab Results  Component Value Date   HGBA1C 5.8 10/25/2014      Assessment and Plan:    Allergic Rhinitis:  Try Flonase OTC daily x 1 week  Cerumen Impaction, right ear:  Manual lavage by CMA  Testicular Pain:  Exam benign Offered referral to urology, he declines  RTC in 6 months for Medicare Wellness Exam

## 2015-10-19 NOTE — Assessment & Plan Note (Signed)
Controlled Will check A1C and Microalbumin today Will check CMET today Continue Metformin Continue yearly eye exams Flu and Pneumovax UTD Prevnar today Foot exam today He is not interested in treatment for neuropathy at this time

## 2015-10-19 NOTE — Addendum Note (Signed)
Addended by: Lurlean Nanny on: 10/19/2015 11:11 AM   Modules accepted: Orders

## 2015-10-25 ENCOUNTER — Telehealth: Payer: Self-pay

## 2015-10-25 NOTE — Telephone Encounter (Signed)
This is not infectious. He needs to take Claritin and Flonase as discussed in the office visit.

## 2015-10-25 NOTE — Telephone Encounter (Signed)
Pt left v/m; pt was seen 10/19/15 with S/T and throat is no better and pt request zpak sent to [place]. Pt request cb.

## 2015-10-26 NOTE — Telephone Encounter (Signed)
Left detailed msg on VM per HIPAA  

## 2015-10-31 DIAGNOSIS — M542 Cervicalgia: Secondary | ICD-10-CM | POA: Diagnosis not present

## 2015-10-31 DIAGNOSIS — M545 Low back pain: Secondary | ICD-10-CM | POA: Diagnosis not present

## 2015-12-12 ENCOUNTER — Telehealth: Payer: Self-pay

## 2015-12-12 NOTE — Telephone Encounter (Signed)
Spoke to pt's daughter and pt has appt 12/14/15 for med eval

## 2015-12-12 NOTE — Telephone Encounter (Signed)
This is not an issue him and I have discussed. He needs to make an appt to discuss it and the treatment options. We do not send in these kinds of medications without seeing the pt

## 2015-12-12 NOTE — Telephone Encounter (Signed)
[name] pts daughter (DPR signed) left v/m requesting med for nerves; pt is very nervous due to being in pain all the time with his back; pt goes to pain mgt but not helping with pain. Pt does not want to come in for appt. [name] said needs med to "chill him out a little", [name] request cb from Avie Echevaria NP. [facility].pt last seen 10/19/15.

## 2015-12-14 ENCOUNTER — Ambulatory Visit (INDEPENDENT_AMBULATORY_CARE_PROVIDER_SITE_OTHER): Payer: Commercial Managed Care - HMO | Admitting: Internal Medicine

## 2015-12-14 ENCOUNTER — Encounter: Payer: Self-pay | Admitting: Internal Medicine

## 2015-12-14 VITALS — BP 122/70 | HR 76 | Temp 97.8°F | Wt 172.5 lb

## 2015-12-14 DIAGNOSIS — G629 Polyneuropathy, unspecified: Secondary | ICD-10-CM

## 2015-12-14 DIAGNOSIS — F419 Anxiety disorder, unspecified: Secondary | ICD-10-CM | POA: Diagnosis not present

## 2015-12-14 DIAGNOSIS — E118 Type 2 diabetes mellitus with unspecified complications: Secondary | ICD-10-CM

## 2015-12-14 DIAGNOSIS — F0391 Unspecified dementia with behavioral disturbance: Secondary | ICD-10-CM

## 2015-12-14 DIAGNOSIS — G4700 Insomnia, unspecified: Secondary | ICD-10-CM | POA: Diagnosis not present

## 2015-12-14 DIAGNOSIS — B356 Tinea cruris: Secondary | ICD-10-CM

## 2015-12-14 DIAGNOSIS — F329 Major depressive disorder, single episode, unspecified: Secondary | ICD-10-CM | POA: Diagnosis not present

## 2015-12-14 LAB — VITAMIN B12: Vitamin B-12: 492 pg/mL (ref 211–911)

## 2015-12-14 MED ORDER — ECONAZOLE NITRATE 1 % EX CREA: 1.0000 "application " | TOPICAL_CREAM | Freq: Every day | CUTANEOUS | Status: DC

## 2015-12-14 MED ORDER — GABAPENTIN 100 MG PO CAPS: 100.0000 mg | ORAL_CAPSULE | Freq: Three times a day (TID) | ORAL | Status: DC

## 2015-12-14 MED ORDER — TRAZODONE HCL 100 MG PO TABS: 150.0000 mg | ORAL_TABLET | Freq: Every day | ORAL | Status: DC

## 2015-12-14 NOTE — Progress Notes (Signed)
Pre visit review using our clinic review tool, if applicable. No additional management support is needed unless otherwise documented below in the visit note. 

## 2015-12-14 NOTE — Progress Notes (Signed)
Subjective:    Patient ID: Alexander Booth, male    DOB: 1945-10-25, 70 y.o.   MRN: [id number]  HPI  Pt is brought to the clinic today by his daughter, who is concerned about anxiety. She reports he is anxious all the time, because he is in so much pain. She is not able to get him to leave the house. He feesl like he worries all the time. He has trouble sleeping. He has no documented history of anxiety or depression, but he is on Prozac daily, and takes Trazadone at night to help him sleep.  He is also concerned about numbness in his feet and hands. This is constant and has been going on for years. Nothing seems to make it better or worse. He has not taken anything for this. He does have a history of DM 2, last A1C 6.1%.   He also wants a refill of his Econazole cream. He frequently gets jock itch, none at this time, but would like a refill in case he needs it.  Review of Systems      Past Medical History  Diagnosis Date  . Hypertension     Essential  . Hypercholesterolemia   . Lumbosacral disc disease     with chronic low back pain  . Bronchitis     Subacute and hx of ongoing cigarette smoking one half pack daily  . Diabetes mellitus     He's also had a hx of high cholesterol   . FHx: heart disease     Positive for   . Dyslipidemia     He also has a past hx of   . Type 2 diabetes mellitus ([place]) 2010    takes oral meds  . Fracture of lumbar vertebra ([place]) 11/25/2012  . Hard of hearing     very little hearing on left side;better in right ear  . Erectile dysfunction     He's had a hx of   . Depression   . Presenile dementia   . Dementia     He has a hx of the   . Peripheral neuropathy ([place])   . Peripheral neuropathy [facility])     He also has   . Numbness and tingling in hands     and feet  . Anginal pain [facility])     Current Outpatient Prescriptions  Medication Sig Dispense Refill  . ACCU-CHEK FASTCLIX LANCETS MISC CHECK BLOOD SUGAR ONCE DAILY AND AS DIRECTED 102 each 5    . ACCU-CHEK SMARTVIEW test strip CHECK BLOOD SUGAR ONCE DAILY AND AS DIRECTED 50 each 5  . bisacodyl (BISACODYL) 5 MG EC tablet Take 5 mg by mouth daily as needed for mild constipation or moderate constipation.    . Bisacodyl (LAXATIVE PO) Take 5 mg by mouth daily as needed (constipation). Biscodyl    . Blood Glucose Monitoring Suppl (ACCU-CHEK NANO SMARTVIEW) W/DEVICE KIT CHECK BLOOD SUGAR ONCE DAILY AND AS DIRECTED. 1 kit 0  . celecoxib (CELEBREX) 200 MG capsule Take 200 mg by mouth daily as needed. Back pain  2  . divalproex (DEPAKOTE ER) 500 MG 24 hr tablet TAKE 2 TABLETS EVERY DAY 180 tablet 3  . donepezil (ARICEPT) 10 MG tablet Take 1 tablet (10 mg total) by mouth every morning. 90 tablet 3  . FLUoxetine (PROZAC) 20 MG capsule Take 1 capsule (20 mg total) by mouth daily. 90 capsule 3  . HYDROcodone-acetaminophen (NORCO) 7.5-325 MG per tablet Take 1 tablet by mouth every 6 (six)  hours as needed for moderate pain. 30 tablet 0  . ketoconazole (NIZORAL) 2 % shampoo Apply 1 application topically 2 (two) times a week. scalp  2  . memantine (NAMENDA XR) 7 MG CP24 24 hr capsule Take 1 capsule (7 mg total) by mouth every morning. 30 capsule 5  . metFORMIN (GLUCOPHAGE) 500 MG tablet Take by mouth 2 (two) times daily with a meal.    . neomycin-bacitracin-polymyxin (NEOSPORIN) [phone] ointment Apply 1 application topically as needed (wound).    . nitroGLYCERIN (NITROSTAT) 0.4 MG SL tablet Place 1 tablet (0.4 mg total) under the tongue every 5 (five) minutes as needed for chest pain. 25 tablet prn  . simvastatin (ZOCOR) 40 MG tablet Take 1 tablet (40 mg total) by mouth daily at 6 PM. 30 tablet 6  . tiZANidine (ZANAFLEX) 4 MG tablet Take 4 mg by mouth every 6 (six) hours as needed for muscle spasms. As needed    . topiramate (TOPAMAX) 50 MG tablet Take 50 mg by mouth 2 (two) times daily.    . traZODone (DESYREL) 100 MG tablet Take 1 tablet (100 mg total) by mouth at bedtime. 90 tablet 3  .  [DISCONTINUED] sildenafil (VIAGRA) 50 MG tablet Take 1 tablet (50 mg total) by mouth as needed. 5 tablet 3   No current facility-administered medications for this visit.    Allergies  Allergen Reactions  . Codeine     Itch     Family History  Problem Relation Age of Onset  . Multiple myeloma Mother   . Diabetes Mother   . Cancer Sister     lymphoma  . Diabetes Sister   . Stroke Neg Hx     Social History   Social History  . Marital Status: Married    Spouse Name: Alexander Booth  . Number of Children: 3  . Years of Education: 12   Occupational History  .      retired   Social History Main Topics  . Smoking status: Current Every Day Smoker -- 1.00 packs/day for 60 years  . Smokeless tobacco: Never Used  . Alcohol Use: No  . Drug Use: No  . Sexual Activity: Not on file   Other Topics Concern  . Not on file   Social History Narrative   Patient is right handed   Patient resides with wife,consumes 2-3 cups of caffeine daily     Constitutional: Denies fever, malaise, fatigue, headache or abrupt weight changes.  Respiratory: Denies difficulty breathing, shortness of breath, cough or sputum production.   Cardiovascular: Denies chest pain, chest tightness, palpitations or swelling in the hands or feet.  Musculoskeletal: Pt reports chronic back pain. Denies difficulty with gait, muscle pain or joint swelling.  Neurological: Pt reports numbness in hands or feet. Denies dizziness, difficulty with speech or problems with balance and coordination.  Psych: Positive for anxiety. Denies depression, SI/HI.  No other specific complaints in a complete review of systems (except as listed in HPI above).  Objective:   Physical Exam   BP 122/70 mmHg  Pulse 76  Temp(Src) 97.8 F (36.6 C) (Oral)  Wt 172 lb 8 oz (78.245 kg)  SpO2 96% Wt Readings from Last 3 Encounters:  12/14/15 172 lb 8 oz (78.245 kg)  10/19/15 175 lb (79.379 kg)  10/10/15 174 lb (78.926 kg)    General: Appears  his stated age, in NAD. Cardiovascular: Normal rate and rhythm. Radial and pedal pulses 2+ bilaterally. Pulmonary/Chest: Normal effort and positive vesicular breath sounds. No  respiratory distress. No wheezes, rales or ronchi noted.  Musculoskeletal: No difficulty with gait.  Neurological: Alert and oriented.  Psychiatric: Mood and affect mildly flat. He does engage and make eye contact, but is not very talkative. He lets his daughter speak for him.  BMET    Component Value Date/Time   NA 139 10/19/2015 0914   NA 141 10/11/2014 1122   K 4.3 10/19/2015 0914   CL 105 10/19/2015 0914   CO2 29 10/19/2015 0914   GLUCOSE 108* 10/19/2015 0914   GLUCOSE 86 10/11/2014 1122   BUN 12 10/19/2015 0914   BUN 11 10/11/2014 1122   CREATININE 0.98 10/19/2015 0914   CREATININE 0.72 03/26/2013 1608   CALCIUM 9.5 10/19/2015 0914   GFRNONAA >60 11/23/2014 0850   GFRAA >60 11/23/2014 0850    Lipid Panel     Component Value Date/Time   CHOL 155 10/19/2015 0914   TRIG 190.0* 10/19/2015 0914   HDL 34.00* 10/19/2015 0914   CHOLHDL 5 10/19/2015 0914   VLDL 38.0 10/19/2015 0914   LDLCALC 83 10/19/2015 0914    CBC    Component Value Date/Time   WBC 6.2 10/19/2015 0914   WBC 6.2 10/11/2014 1122   RBC 4.18* 10/19/2015 0914   RBC 4.40 10/11/2014 1122   HGB 14.0 10/19/2015 0914   HCT 40.7 10/19/2015 0914   PLT 139.0* 10/19/2015 0914   MCV 97.4 10/19/2015 0914   MCH 33.0 11/23/2014 0850   MCH 33.2* 10/11/2014 1122   MCHC 34.4 10/19/2015 0914   MCHC 34.6 10/11/2014 1122   RDW 13.2 10/19/2015 0914   RDW 13.5 10/11/2014 1122   LYMPHSABS 1.3 10/11/2014 1122   EOSABS 0.0 10/11/2014 1122   BASOSABS 0.0 10/11/2014 1122    Hgb A1C Lab Results  Component Value Date   HGBA1C 6.1 10/19/2015        Assessment & Plan:   Jock itch:  Econazole cream refilled today  Anxiety and Insomnia:  His daughter does not want to go up on the Prozac Will increase Trazadone to 150 mg QHS She is  requesting RX for Xanax- advised I am hesitant to do this due to the fact that he is on narcotics and Trazadone and I don't want to increase his risk for falls  Neuropathic Pain:  Will check B12 today DM 2 is controlled- continue Metformin Neurontin 100 mg TID  Daughter will update me in 6 weeks via mychart to let me know how he is doing.  Webb Silversmith, NP

## 2015-12-14 NOTE — Patient Instructions (Signed)
Generalized Anxiety Disorder Generalized anxiety disorder (GAD) is a mental disorder. It interferes with life functions, including relationships, work, and school. GAD is different from normal anxiety, which everyone experiences at some point in their lives in response to specific life events and activities. Normal anxiety actually helps us prepare for and get through these life events and activities. Normal anxiety goes away after the event or activity is over.  GAD causes anxiety that is not necessarily related to specific events or activities. It also causes excess anxiety in proportion to specific events or activities. The anxiety associated with GAD is also difficult to control. GAD can vary from mild to severe. People with severe GAD can have intense waves of anxiety with physical symptoms (panic attacks).  SYMPTOMS The anxiety and worry associated with GAD are difficult to control. This anxiety and worry are related to many life events and activities and also occur more days than not for 6 months or longer. People with GAD also have three or more of the following symptoms (one or more in children):  Restlessness.   Fatigue.  Difficulty concentrating.   Irritability.  Muscle tension.  Difficulty sleeping or unsatisfying sleep. DIAGNOSIS GAD is diagnosed through an assessment by your health care provider. Your health care provider will ask you questions aboutyour mood,physical symptoms, and events in your life. Your health care provider may ask you about your medical history and use of alcohol or drugs, including prescription medicines. Your health care provider may also do a physical exam and blood tests. Certain medical conditions and the use of certain substances can cause symptoms similar to those associated with GAD. Your health care provider may refer you to a mental health specialist for further evaluation. TREATMENT The following therapies are usually used to treat GAD:    Medication. Antidepressant medication usually is prescribed for long-term daily control. Antianxiety medicines may be added in severe cases, especially when panic attacks occur.   Talk therapy (psychotherapy). Certain types of talk therapy can be helpful in treating GAD by providing support, education, and guidance. A form of talk therapy called cognitive behavioral therapy can teach you healthy ways to think about and react to daily life events and activities.  Stress managementtechniques. These include yoga, meditation, and exercise and can be very helpful when they are practiced regularly. A mental health specialist can help determine which treatment is best for you. Some people see improvement with one therapy. However, other people require a combination of therapies.   This information is not intended to replace advice given to you by your health care provider. Make sure you discuss any questions you have with your health care provider.   Document Released: 11/02/2012 Document Revised: 07/29/2014 Document Reviewed: 11/02/2012 Elsevier Interactive Patient Education 2016 [employer]  

## 2016-01-04 ENCOUNTER — Telehealth: Payer: Self-pay | Admitting: Neurology

## 2016-01-04 NOTE — Telephone Encounter (Signed)
I spoke to daughter and gave her information below. They never saw Dr. Casimiro Needle. I gave her the number to their office, stating that we sent referral to them in March and referral should still be good. I advised her why it is important that patient sees Dr. Casimiro Needle, daughter did not know why he should see him. I also advised that he may need to see PCP if she feels that patient needs to be seen sooner.

## 2016-01-04 NOTE — Telephone Encounter (Signed)
Error

## 2016-01-04 NOTE — Telephone Encounter (Addendum)
Daughter [name] called to advise, "for the past week and a half, patient is not shutting down, mind is constantly racing, stays on the go, was sleeping 12,14,16 hours a day now lucky if he sleeps 4 hours in a 24 hour period. States patient is on traZODone (DESYREL) 100 MG tablet and they have been giving 2 at night and it's not even touching it, she took him off of Gabapentin 1 week ago today, states after a day or 2 of taking the Gabapentin is when this all started." Please call [phone] or [phone].

## 2016-01-04 NOTE — Telephone Encounter (Signed)
[name] states that since starting the Gabapentin patient has been anxious, not sleeping, and wife and daughter are unable manage him. She states that her and her mother are "exhausted" trying to care for the patient. [name] seemed tearful and asked for something to "help slow the patient down". She stopped the patient's Gabapentin on 12/28/15 but there has been no change. He has an appt on 6/21 but is asking for help sooner.

## 2016-01-04 NOTE — Telephone Encounter (Signed)
I'm not sure who started the gabapentin. I don't think I prescribed this. Has he seen psychiatry yet? I made a referral last time. Please inquire and talk to daughter. We had similar issues last time during visit, he was anxious, depressed, tearful. It will be difficult to manage his issues and it will be hard for his caretakers. Please see that we get records from his psychiatrist as well.

## 2016-01-08 ENCOUNTER — Telehealth: Payer: Self-pay

## 2016-01-08 NOTE — Telephone Encounter (Signed)
Why is he off the gabapentin?

## 2016-01-08 NOTE — Telephone Encounter (Signed)
[name] pts daughter (DPR signed) left v/m dementia doctor referred pt to psychiatrist and psychiatrist cannot see pt until 02/2016. Pt last seen 12/14/15 by Avie Echevaria NP. [name] request cb. I spoke with [name]; for 2 weeks pts mind has been constantly racing; pt cannot sleep at night; usually sleeps 2 - 4 hours at night. [name] said dementia doctor did psychiatrist referral for pt to get med to calm pts mind down so he can sleep at night.  [name] scheduled appt with psychiatrist in 02/2016. Pt is taking trazodone 100 mg; over weekend [name] gave pt 3 trazodone but pt did not sleep more than one hour. [facility] [place]. Pt has been off gabapentin for 7-10 days. [name] request cb from Webb Silversmith NP.

## 2016-01-08 NOTE — Telephone Encounter (Signed)
Alexander Booth reports when pt started the Gabapentin took x 7 days---increase in anxiety Sx and not getting sleep---going outside in the middle of the night... Just will not settle down-- was advised to call PCP as pt does not have appt until 02/2016...daughter stated that she even tried 150 mg with no change in sleeping patterns---CB Alexander Booth [phone] advise

## 2016-01-09 ENCOUNTER — Emergency Department (HOSPITAL_COMMUNITY): Payer: Commercial Managed Care - HMO

## 2016-01-09 ENCOUNTER — Other Ambulatory Visit: Payer: Self-pay

## 2016-01-09 ENCOUNTER — Telehealth: Payer: Self-pay | Admitting: Internal Medicine

## 2016-01-09 ENCOUNTER — Telehealth: Payer: Self-pay | Admitting: Neurology

## 2016-01-09 ENCOUNTER — Encounter (HOSPITAL_COMMUNITY): Payer: Self-pay | Admitting: *Deleted

## 2016-01-09 ENCOUNTER — Emergency Department (HOSPITAL_COMMUNITY)
Admission: EM | Admit: 2016-01-09 | Discharge: 2016-01-12 | Disposition: A | Discharge status: Other Acute Inpatient Hospital | Payer: Commercial Managed Care - HMO | Attending: Emergency Medicine | Admitting: Emergency Medicine

## 2016-01-09 DIAGNOSIS — G4700 Insomnia, unspecified: Secondary | ICD-10-CM | POA: Diagnosis not present

## 2016-01-09 DIAGNOSIS — F17200 Nicotine dependence, unspecified, uncomplicated: Secondary | ICD-10-CM | POA: Diagnosis not present

## 2016-01-09 DIAGNOSIS — E1140 Type 2 diabetes mellitus with diabetic neuropathy, unspecified: Secondary | ICD-10-CM | POA: Insufficient documentation

## 2016-01-09 DIAGNOSIS — Y990 Civilian activity done for income or pay: Secondary | ICD-10-CM | POA: Insufficient documentation

## 2016-01-09 DIAGNOSIS — F419 Anxiety disorder, unspecified: Secondary | ICD-10-CM | POA: Diagnosis not present

## 2016-01-09 DIAGNOSIS — F0391 Unspecified dementia with behavioral disturbance: Secondary | ICD-10-CM

## 2016-01-09 DIAGNOSIS — F03918 Unspecified dementia, unspecified severity, with other behavioral disturbance: Secondary | ICD-10-CM | POA: Diagnosis present

## 2016-01-09 DIAGNOSIS — Z96652 Presence of left artificial knee joint: Secondary | ICD-10-CM | POA: Insufficient documentation

## 2016-01-09 DIAGNOSIS — W228XXA Striking against or struck by other objects, initial encounter: Secondary | ICD-10-CM | POA: Insufficient documentation

## 2016-01-09 DIAGNOSIS — F329 Major depressive disorder, single episode, unspecified: Secondary | ICD-10-CM | POA: Insufficient documentation

## 2016-01-09 DIAGNOSIS — Y939 Activity, unspecified: Secondary | ICD-10-CM | POA: Diagnosis not present

## 2016-01-09 DIAGNOSIS — S299XXA Unspecified injury of thorax, initial encounter: Secondary | ICD-10-CM | POA: Diagnosis not present

## 2016-01-09 DIAGNOSIS — S39012A Strain of muscle, fascia and tendon of lower back, initial encounter: Secondary | ICD-10-CM | POA: Diagnosis not present

## 2016-01-09 DIAGNOSIS — S199XXA Unspecified injury of neck, initial encounter: Secondary | ICD-10-CM | POA: Diagnosis not present

## 2016-01-09 DIAGNOSIS — S161XXA Strain of muscle, fascia and tendon at neck level, initial encounter: Secondary | ICD-10-CM

## 2016-01-09 DIAGNOSIS — Z049 Encounter for examination and observation for unspecified reason: Secondary | ICD-10-CM

## 2016-01-09 DIAGNOSIS — I10 Essential (primary) hypertension: Secondary | ICD-10-CM | POA: Diagnosis not present

## 2016-01-09 DIAGNOSIS — Y9259 Other trade areas as the place of occurrence of the external cause: Secondary | ICD-10-CM | POA: Diagnosis not present

## 2016-01-09 DIAGNOSIS — Z7984 Long term (current) use of oral hypoglycemic drugs: Secondary | ICD-10-CM | POA: Insufficient documentation

## 2016-01-09 DIAGNOSIS — W19XXXA Unspecified fall, initial encounter: Secondary | ICD-10-CM

## 2016-01-09 DIAGNOSIS — R296 Repeated falls: Secondary | ICD-10-CM | POA: Diagnosis not present

## 2016-01-09 DIAGNOSIS — S060X0A Concussion without loss of consciousness, initial encounter: Secondary | ICD-10-CM | POA: Insufficient documentation

## 2016-01-09 DIAGNOSIS — Z79899 Other long term (current) drug therapy: Secondary | ICD-10-CM | POA: Insufficient documentation

## 2016-01-09 DIAGNOSIS — S0990XA Unspecified injury of head, initial encounter: Secondary | ICD-10-CM | POA: Diagnosis not present

## 2016-01-09 LAB — URINALYSIS, ROUTINE W REFLEX MICROSCOPIC
BILIRUBIN URINE: NEGATIVE
Glucose, UA: NEGATIVE mg/dL
Hgb urine dipstick: NEGATIVE
Ketones, ur: NEGATIVE mg/dL
Leukocytes, UA: NEGATIVE
NITRITE: NEGATIVE
PH: 7 (ref 5.0–8.0)
Protein, ur: NEGATIVE mg/dL
SPECIFIC GRAVITY, URINE: 1.007 (ref 1.005–1.030)

## 2016-01-09 LAB — COMPREHENSIVE METABOLIC PANEL
ALK PHOS: 45 U/L (ref 38–126)
ALT: 16 U/L — AB (ref 17–63)
AST: 22 U/L (ref 15–41)
Albumin: 4.1 g/dL (ref 3.5–5.0)
Anion gap: 5 (ref 5–15)
BILIRUBIN TOTAL: 0.6 mg/dL (ref 0.3–1.2)
BUN: 9 mg/dL (ref 6–20)
CALCIUM: 9.1 mg/dL (ref 8.9–10.3)
CO2: 30 mmol/L (ref 22–32)
CREATININE: 0.91 mg/dL (ref 0.61–1.24)
Chloride: 104 mmol/L (ref 101–111)
GFR calc Af Amer: >60 mL/min (ref >60)
Glucose, Bld: 93 mg/dL (ref 65–99)
POTASSIUM: 3.9 mmol/L (ref 3.5–5.1)
Sodium: 139 mmol/L (ref 135–145)
TOTAL PROTEIN: 6.7 g/dL (ref 6.5–8.1)

## 2016-01-09 LAB — RAPID URINE DRUG SCREEN, HOSP PERFORMED
AMPHETAMINES: NOT DETECTED
Barbiturates: NOT DETECTED
Benzodiazepines: POSITIVE — AB
COCAINE: NOT DETECTED
OPIATES: POSITIVE — AB
Tetrahydrocannabinol: NOT DETECTED

## 2016-01-09 LAB — CBC
HCT: 39.4 % (ref 39.0–52.0)
Hemoglobin: 13.8 g/dL (ref 13.0–17.0)
MCH: 33.9 pg (ref 26.0–34.0)
MCHC: 35 g/dL (ref 30.0–36.0)
MCV: 96.8 fL (ref 78.0–100.0)
PLATELETS: 127 10*3/uL — AB (ref 150–400)
RBC: 4.07 MIL/uL — ABNORMAL LOW (ref 4.22–5.81)
RDW: 12.4 % (ref 11.5–15.5)
WBC: 6 10*3/uL (ref 4.0–10.5)

## 2016-01-09 LAB — VALPROIC ACID LEVEL: Valproic Acid Lvl: 55 ug/mL (ref 50.0–100.0)

## 2016-01-09 LAB — ETHANOL

## 2016-01-09 MED ORDER — DIVALPROEX SODIUM ER 500 MG PO TB24
1000.0000 mg | ORAL_TABLET | Freq: Every day | ORAL | Status: DC
  Administered 2016-01-09: 1000 mg via ORAL
  Filled 2016-01-09 (×2): qty 2

## 2016-01-09 MED ORDER — LORAZEPAM 1 MG PO TABS
1.0000 mg | ORAL_TABLET | Freq: Once | ORAL | Status: AC
  Administered 2016-01-09: 1 mg via ORAL
  Filled 2016-01-09: qty 1

## 2016-01-09 MED ORDER — LORAZEPAM 1 MG PO TABS
2.0000 mg | ORAL_TABLET | Freq: Once | ORAL | Status: AC
  Administered 2016-01-09: 2 mg via ORAL
  Filled 2016-01-09: qty 2

## 2016-01-09 MED ORDER — METFORMIN HCL 500 MG PO TABS: 500.0000 mg | ORAL_TABLET | Freq: Two times a day (BID) | ORAL | Status: DC

## 2016-01-09 MED ORDER — FLUOXETINE HCL 20 MG PO CAPS
20.0000 mg | ORAL_CAPSULE | Freq: Every day | ORAL | Status: DC
  Administered 2016-01-09 – 2016-01-12 (×4): 20 mg via ORAL
  Filled 2016-01-09 (×4): qty 1

## 2016-01-09 MED ORDER — TIZANIDINE HCL 4 MG PO TABS
4.0000 mg | ORAL_TABLET | Freq: Four times a day (QID) | ORAL | Status: DC | PRN
  Filled 2016-01-09: qty 1

## 2016-01-09 MED ORDER — HYDROCODONE-ACETAMINOPHEN 7.5-325 MG PO TABS
1.0000 | ORAL_TABLET | Freq: Four times a day (QID) | ORAL | Status: DC | PRN
  Administered 2016-01-10 (×2): 1 via ORAL
  Filled 2016-01-09 (×2): qty 1

## 2016-01-09 MED ORDER — LORAZEPAM 1 MG PO TABS: 1.0000 mg | ORAL_TABLET | ORAL | Status: DC | PRN

## 2016-01-09 MED ORDER — TRAZODONE HCL 50 MG PO TABS
150.0000 mg | ORAL_TABLET | Freq: Every day | ORAL | Status: DC
  Administered 2016-01-09 – 2016-01-11 (×3): 150 mg via ORAL
  Filled 2016-01-09 (×3): qty 1

## 2016-01-09 MED ORDER — ONDANSETRON HCL 4 MG PO TABS: 4.0000 mg | ORAL_TABLET | Freq: Three times a day (TID) | ORAL | Status: DC | PRN

## 2016-01-09 MED ORDER — HYDROCODONE-ACETAMINOPHEN 5-325 MG PO TABS
1.0000 | ORAL_TABLET | Freq: Once | ORAL | Status: AC
  Administered 2016-01-09: 1 via ORAL
  Filled 2016-01-09: qty 1

## 2016-01-09 MED ORDER — ALUM & MAG HYDROXIDE-SIMETH 200-200-20 MG/5ML PO SUSP: 30.0000 mL | ORAL | Status: DC | PRN

## 2016-01-09 MED ORDER — TOPIRAMATE 25 MG PO TABS
50.0000 mg | ORAL_TABLET | Freq: Two times a day (BID) | ORAL | Status: DC
  Administered 2016-01-09 – 2016-01-12 (×5): 50 mg via ORAL
  Filled 2016-01-09 (×6): qty 2

## 2016-01-09 MED ORDER — DONEPEZIL HCL 5 MG PO TABS
10.0000 mg | ORAL_TABLET | Freq: Every morning | ORAL | Status: DC
  Administered 2016-01-10 – 2016-01-12 (×3): 10 mg via ORAL
  Filled 2016-01-09 (×3): qty 2

## 2016-01-09 NOTE — Progress Notes (Signed)
Spouse is patient's POA.

## 2016-01-09 NOTE — Telephone Encounter (Addendum)
[name] left v/m requesting cb. Recording wireless customer not available and no v/m. Unable to contact on other phone lines. Per chart review tab pt is at [facility] ED.

## 2016-01-09 NOTE — Telephone Encounter (Signed)
Patient was taken to ED today.

## 2016-01-09 NOTE — ED Notes (Signed)
MD at bedside. 

## 2016-01-09 NOTE — Progress Notes (Signed)
This writer completed a chart review for disposition.    Vinita Prentiss, MSW, LCSW, LCAS [facility] Triage Specialist [phone] 

## 2016-01-09 NOTE — ED Notes (Signed)
Pt changed into scrubs and wanded by security  

## 2016-01-09 NOTE — ED Notes (Signed)
Pt's daughter and wife reports for the past 2 weeks, pt has had several falls, last fall was last night while working in the garage from 2300 last night until 0500 this am.  Daughter reports pt has Dementia and has not been sleeping for the past 2 weeks.  She reports pt would only sleep 2 hours at night and constantly do stuff that would cause him to fall.  Pt reports L rib and back pain.  Small bruising noted in his L mid-back and L rib area.  Pt is alert, calm and cooperative at this time.  His daughter is concerned that when a counselor assesses the pt that "he would fool them and not tell them what's going on."  She wants to be at bedside when psychiatrist or counselor evaluates pt.

## 2016-01-09 NOTE — Telephone Encounter (Signed)
[name] said that pt had fallen several times last night and wanted to bring pt to see Avie Echevaria NP today by car and then have pt taken to hospital by ambulance from [facility]; [name] said pt had agreed to see Avie Echevaria NP. Avie Echevaria NP said to tell pt she advised to take pt directly to ED by ambulance. [name] will advise pt but she is afraid he will not do that and [name] will cb if needed. FYI to Avie Echevaria NP.

## 2016-01-09 NOTE — Telephone Encounter (Signed)
We can stop Trazadone and start Restoril. Let me know if she wants to try this. Also, she needs to see if psychiatrist appt can be moved up.

## 2016-01-09 NOTE — Telephone Encounter (Signed)
See other phone note--called lmovm

## 2016-01-09 NOTE — Telephone Encounter (Addendum)
Daughter [name] [phone] called requesting to speak with Dr. Rexene Alberts regarding issues with Father, states Dr. Rexene Alberts referred to Psychiatrist, next available appointment is in August, states Father "may need to be put in hospital, major emotional issues". Wife Vaughan Basta came to the phone and advised that patient is staying up 24 hours and not sleeping, is hitting her and pushing her around.

## 2016-01-09 NOTE — Telephone Encounter (Signed)
Patient's daughter,[name],is asking for [name] to call her first thing this morning about patient.

## 2016-01-09 NOTE — Telephone Encounter (Signed)
Left message on voicemail.

## 2016-01-09 NOTE — Telephone Encounter (Signed)
If there is concern regarding safety, including patient's own safety or spouse's safety, the best they can do is to take him to the ER or call 911. I made the psych referral in March and he could not get in till August??? Please call [name] back.

## 2016-01-09 NOTE — Telephone Encounter (Signed)
Mel- You have a phone call already about this.

## 2016-01-09 NOTE — ED Provider Notes (Signed)
CSN: [id number]     Arrival date & time 01/09/16  1310 History   First MD Initiated Contact with Patient 01/09/16 1508     Chief Complaint  Patient presents with  . Medical Clearance  . Fall     Patient is a 70 y.o. male presenting with fall. The history is provided by the patient, a relative and the spouse.  Fall This is a new problem. The problem occurs constantly. The problem has been gradually worsening. Associated symptoms include headaches. Pertinent negatives include no abdominal pain. The symptoms are aggravated by walking. The symptoms are relieved by rest.   Patient presents after fall Apparently he was in his shed last night at 2am on a ladder and fell hitting his head and injuring his back/chest He reports continued HA and chest wall and back pain  He reports he has not been sleeping well recently He reports he is up most hours of the nights  He denies any complaints Past Medical History  Diagnosis Date  . Hypertension     Essential  . Hypercholesterolemia   . Lumbosacral disc disease     with chronic low back pain  . Bronchitis     Subacute and hx of ongoing cigarette smoking one half pack daily  . Diabetes mellitus     He's also had a hx of high cholesterol   . FHx: heart disease     Positive for   . Dyslipidemia     He also has a past hx of   . Type 2 diabetes mellitus ([place]) 2010    takes oral meds  . Fracture of lumbar vertebra ([place]) 11/25/2012  . Hard of hearing     very little hearing on left side;better in right ear  . Erectile dysfunction     He's had a hx of   . Depression   . Presenile dementia   . Dementia     He has a hx of the   . Peripheral neuropathy ([place])   . Peripheral neuropathy [facility])     He also has   . Numbness and tingling in hands     and feet  . Anginal pain [facility])    Past Surgical History  Procedure Laterality Date  . Knee surgery      Previous left total replacement  . Hemorrhoid surgery      x2  . Other surgical history   1992    Bone Fusion in Right Foot  . Anterior cervical decomp/discectomy fusion    . Appendectomy    . Joint replacement Left     2nd one was to change siZe  . Total knee arthroplasty Left   . Kyphoplasty N/A 12/10/2012    Procedure: Lumbar one Kyphoplasty;  Surgeon: Kristeen Miss, MD;  Location: [place] NEURO ORS;  Service: Neurosurgery;  Laterality: N/A;  Lumbar One Kyphoplasty  . Spinal cord stimulator insertion N/A 12/02/2014    Procedure: THORACIC SPINAL CORD STIMULATOR INSERTION INFINION BOSTON SCIENTIFIC 16 LEAD WITH LAMINECTOMY;  Surgeon: Erline Levine, MD;  Location: [place] NEURO ORS;  Service: Neurosurgery;  Laterality: N/A;  THORACIC SPINAL CORD STIMULATOR INSERTION INFINION BOSTON SCIENTIFIC 16 LEAD WITH LAMINECTOMY   Family History  Problem Relation Age of Onset  . Multiple myeloma Mother   . Diabetes Mother   . Cancer Sister     lymphoma  . Diabetes Sister   . Stroke Neg Hx    Social History  Substance Use Topics  . 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 Smokeless tobacco: Never Used  . Alcohol Use: No    Review of Systems  Constitutional: Positive for fatigue. Negative for fever.  Gastrointestinal: Negative for abdominal pain.  Musculoskeletal: Positive for back pain, arthralgias and neck pain.  Neurological: Positive for headaches.  Psychiatric/Behavioral: The patient is nervous/anxious.   All other systems reviewed and are negative.     Allergies  Codeine  Home Medications   Prior to Admission medications   Medication Sig Start Date End Date Taking? Authorizing Provider  ACCU-CHEK FASTCLIX LANCETS MISC CHECK BLOOD SUGAR ONCE DAILY AND AS DIRECTED 07/25/15  Yes [name], NP  ACCU-CHEK SMARTVIEW test strip CHECK BLOOD SUGAR ONCE DAILY AND AS DIRECTED 07/25/15  Yes [name], NP  bisacodyl (BISACODYL) 5 MG EC tablet Take 5 mg by mouth daily as needed for mild constipation or moderate constipation.   Yes Historical Provider,  MD  Blood Glucose Monitoring Suppl (ACCU-CHEK NANO SMARTVIEW) W/DEVICE KIT CHECK BLOOD SUGAR ONCE DAILY AND AS DIRECTED. 05/10/15  Yes [name], NP  divalproex (DEPAKOTE ER) 500 MG 24 hr tablet TAKE 2 TABLETS EVERY DAY Patient taking differently: TAKE 2 TABLETS every night at bedtime 08/24/15  Yes Star Age, MD  donepezil (ARICEPT) 10 MG tablet Take 1 tablet (10 mg total) by mouth every morning. 10/10/15  Yes Star Age, MD  FLUoxetine (PROZAC) 20 MG capsule Take 1 capsule (20 mg total) by mouth daily. 10/10/15  Yes Star Age, MD  HYDROcodone-acetaminophen (NORCO) 7.5-325 MG per tablet Take 1 tablet by mouth every 6 (six) hours as needed for moderate pain. 12/03/14  Yes Eustace Moore, MD  ketoconazole (NIZORAL) 2 % shampoo Apply 1 application topically 2 (two) times a week. scalp 06/19/15  Yes Historical Provider, MD  metFORMIN (GLUCOPHAGE) 500 MG tablet Take by mouth 2 (two) times daily with a meal.   Yes Historical Provider, MD  nitroGLYCERIN (NITROSTAT) 0.4 MG SL tablet Place 1 tablet (0.4 mg total) under the tongue every 5 (five) minutes as needed for chest pain. 11/15/13  Yes Darlin Coco, MD  simvastatin (ZOCOR) 40 MG tablet Take 1 tablet (40 mg total) by mouth daily at 6 PM. Patient taking differently: Take 40 mg by mouth every other day.  12/01/14  Yes Darlin Coco, MD  tiZANidine (ZANAFLEX) 4 MG tablet Take 4 mg by mouth every 6 (six) hours as needed for muscle spasms.  03/21/14  Yes Historical Provider, MD  topiramate (TOPAMAX) 50 MG tablet Take 50 mg by mouth 2 (two) times daily.   Yes Historical Provider, MD  traZODone (DESYREL) 100 MG tablet Take 1.5 tablets (150 mg total) by mouth at bedtime. Patient taking differently: Take 200 mg by mouth at bedtime.  12/14/15  Yes [name], NP  econazole nitrate 1 % cream Apply 1 application topically daily. Patient not taking: Reported on 01/09/2016 12/14/15   [name], NP  gabapentin (NEURONTIN) 100 MG capsule Take 1 capsule  (100 mg total) by mouth 3 (three) times daily. Patient not taking: Reported on 01/09/2016 12/14/15   [name], NP  memantine (NAMENDA XR) 7 MG CP24 24 hr capsule Take 1 capsule (7 mg total) by mouth every morning. Patient not taking: Reported on 01/09/2016 10/10/15   Star Age, MD   BP 149/69 mmHg  Pulse 62  Temp(Src) 97.4 F (36.3 C) (Oral)  Resp 16  SpO2 97% Physical Exam CONSTITUTIONAL: Elderly, frail, anxious HEAD: Normocephalic/atraumatic.  Diffuse tenderness to scalp EYES: EOMI/PERRL ENMT: Mucous membranes moist, no signs of trauma NECK: supple no meningeal signs SPINE/BACK:diffuse CTL tenderness noted CV: S1/S2 noted, no murmurs/rubs/gallops noted LUNGS: Lungs are clear to auscultation bilaterally, no apparent distress Chest - diffuse chest wall tenderness no crepitus noted ABDOMEN: soft, nontender NEURO: Pt is awake/alert/appropriate, moves all extremitiesx4.  No facial droop.  Steady gait noted.  Mild tremor noted to extremities EXTREMITIES: pulses normal/equal, full ROM, All extremities/joints palpated/ranged and nontender SKIN: warm, color normal PSYCH: anxious appearing  ED Course  Procedures   4:06 PM Pt with h/o chronic pain, sees pain specialist, h/o dementia presents with fall Apparently he does not sleep and fell from ladder last night Family is concerned about his insomnia - no sleep over past 2 weeks, he is very anxious and more combative She reports he is had manic episodes before but no diagnosis of bipolar No signs of acute injury by imaging  However he appears anxious, has not slept in 2 weeks and may have mania If all labs unremarkable he will need psych consult If he tries to leave he may need IVC Signed out to dr Dayna Barker to f/u on EKG/urinalysis/depakote level If negative will need psych consult   Labs Review Labs Reviewed  COMPREHENSIVE METABOLIC PANEL - Abnormal; Notable for the following:    ALT 16 (*)    All other components within  normal limits  CBC - Abnormal; Notable for the following:    RBC 4.07 (*)    Platelets 127 (*)    All other components within normal limits  ETHANOL  URINE RAPID DRUG SCREEN, HOSP PERFORMED  URINALYSIS, ROUTINE W REFLEX MICROSCOPIC (NOT AT [facility])  VALPROIC ACID LEVEL    Imaging Review Dg Ribs Unilateral W/chest Left  01/09/2016  CLINICAL DATA:  Recurrent falls over the past 2 weeks. Left rib and low back pain. Initial encounter. EXAM: LEFT RIBS AND CHEST - 3+ VIEW COMPARISON:  PA and lateral chest 02/07/2014. FINDINGS: The lungs are clear. Heart size is normal. No pneumothorax or pleural effusion. No fracture is identified. IMPRESSION: Negative exam. Electronically Signed   By: Inge Rise M.D.   On: 01/09/2016 14:27   Dg Lumbar Spine Complete  01/09/2016  CLINICAL DATA:  Falls. EXAM: LUMBAR SPINE - COMPLETE 4+ VIEW COMPARISON:  MRI 07/30/2014 . FINDINGS: Mild scoliosis concave left. Diffuse multilevel degenerative change. L1-L2 posterior interbody fusion. Prior L1 vertebroplasty. Neurostimulator.Aortoiliac atherosclerotic vascular calcification. IMPRESSION: 1.  L1-L2 posterior interbody fusion.  L1 vertebroplasty. 2. Lumbar spine scoliosis concave left. Diffuse degenerative change lumbar spine . 3.  Aortoiliac atherosclerotic vascular disease. Electronically Signed   By: Marcello Moores  Register   On: 01/09/2016 14:26   Ct Head Wo Contrast  01/09/2016  CLINICAL DATA:  Recurrent falls with the most recent occurring last night. Initial encounter. EXAM: CT HEAD WITHOUT CONTRAST CT CERVICAL SPINE WITHOUT CONTRAST TECHNIQUE: Multidetector CT imaging of the head and cervical spine was performed following the standard protocol without intravenous contrast. Multiplanar CT image reconstructions of the cervical spine were also generated. COMPARISON:  Head CT scan 04/19/2014. MRI cervical spine 07/30/2014. FINDINGS: CT HEAD FINDINGS Remote lacunar infarction right caudate head is identified. There is some  chronic microvascular ischemic change in the deep white matter structures. No evidence of acute intracranial abnormality including hemorrhage, infarct, mass lesion, mass effect, midline shift or abnormal extra-axial fluid collection. The calvarium is intact. No hydrocephalus or pneumocephalus. Imaged paranasal sinuses and mastoid air cells are clear. CT CERVICAL SPINE  FINDINGS The patient is status post C3-6 fusion. No fracture or malalignment is identified. The patient is status post C3-6 fusion. The levels are solidly fused and fusion hardware is intact. Lung apices demonstrate emphysematous change. IMPRESSION: No acute abnormality head or cervical spine. Chronic microvascular ischemic change and small remote lacunar infarction right caudate head. Status post cervical fusion. Electronically Signed   By: Inge Rise M.D.   On: 01/09/2016 15:58   Ct Cervical Spine Wo Contrast  01/09/2016  CLINICAL DATA:  Recurrent falls with the most recent occurring last night. Initial encounter. EXAM: CT HEAD WITHOUT CONTRAST CT CERVICAL SPINE WITHOUT CONTRAST TECHNIQUE: Multidetector CT imaging of the head and cervical spine was performed following the standard protocol without intravenous contrast. Multiplanar CT image reconstructions of the cervical spine were also generated. COMPARISON:  Head CT scan 04/19/2014. MRI cervical spine 07/30/2014. FINDINGS: CT HEAD FINDINGS Remote lacunar infarction right caudate head is identified. There is some chronic microvascular ischemic change in the deep white matter structures. No evidence of acute intracranial abnormality including hemorrhage, infarct, mass lesion, mass effect, midline shift or abnormal extra-axial fluid collection. The calvarium is intact. No hydrocephalus or pneumocephalus. Imaged paranasal sinuses and mastoid air cells are clear. CT CERVICAL SPINE FINDINGS The patient is status post C3-6 fusion. No fracture or malalignment is identified. The patient is status  post C3-6 fusion. The levels are solidly fused and fusion hardware is intact. Lung apices demonstrate emphysematous change. IMPRESSION: No acute abnormality head or cervical spine. Chronic microvascular ischemic change and small remote lacunar infarction right caudate head. Status post cervical fusion. Electronically Signed   By: Inge Rise M.D.   On: 01/09/2016 15:58   I have personally reviewed and evaluated these images and lab results as part of my medical decision-making.   EKG Interpretation None      MDM   Final diagnoses:  Fall, initial encounter  Concussion, without loss of consciousness, initial encounter  Cervical strain, initial encounter  Back strain, initial encounter  Anxiety  Insomnia  Nursing notes including past medical history and social history reviewed and considered in documentation xrays/imaging reviewed by myself and considered during evaluation Labs/vital reviewed myself and considered during evaluation     Ripley Fraise, MD 01/09/16 1621
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
## 2016-01-09 NOTE — Telephone Encounter (Signed)
Looks like patient has appt with you tomorrow. I also see that PCP has d/c'd trazodone and started Temazepam

## 2016-01-09 NOTE — Progress Notes (Signed)
Attempted to Secure placement at the following facilities:   Faxed To:  Bartholomew, MSW, Darlyn Read [facility] Triage Specialist [phone]

## 2016-01-09 NOTE — ED Notes (Signed)
EKG given to EDP,Jacubowitz,MD., for review. 

## 2016-01-10 ENCOUNTER — Ambulatory Visit: Payer: Commercial Managed Care - HMO | Admitting: Neurology

## 2016-01-10 ENCOUNTER — Telehealth: Payer: Self-pay

## 2016-01-10 DIAGNOSIS — F0391 Unspecified dementia with behavioral disturbance: Secondary | ICD-10-CM | POA: Diagnosis not present

## 2016-01-10 DIAGNOSIS — F03918 Unspecified dementia, unspecified severity, with other behavioral disturbance: Secondary | ICD-10-CM | POA: Diagnosis present

## 2016-01-10 MED ORDER — GABAPENTIN 300 MG PO CAPS
300.0000 mg | ORAL_CAPSULE | Freq: Three times a day (TID) | ORAL | Status: DC
  Administered 2016-01-10 – 2016-01-12 (×5): 300 mg via ORAL
  Filled 2016-01-10 (×6): qty 1

## 2016-01-10 MED ORDER — LORAZEPAM 0.5 MG PO TABS
0.5000 mg | ORAL_TABLET | Freq: Two times a day (BID) | ORAL | Status: DC
  Administered 2016-01-10 – 2016-01-12 (×5): 0.5 mg via ORAL
  Filled 2016-01-10 (×5): qty 1
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
## 2016-01-10 NOTE — Telephone Encounter (Signed)
Patient is currently in hospital, unable to come to appt today.

## 2016-01-10 NOTE — Telephone Encounter (Signed)
Dr. Rexene Alberts has been advised.

## 2016-01-10 NOTE — ED Notes (Signed)
Daughter requests that MD or PA call her to discuss her father's case. [phone] [name]) [phone] (cell-[name]), or son, Lesly Rubenstein [phone].

## 2016-01-10 NOTE — Telephone Encounter (Signed)
Daughter [name] [phone] called 8:14:36am to advise patient will not be here for 8:30am appointment this morning, patient fell several times and is currently in the hospital. [name] also states they may have to admit him under psychiatric care.

## 2016-01-10 NOTE — BH Assessment (Addendum)
[place] Assessment Progress Note  Per Corena Pilgrim, MD, this pt requires psychiatric hospitalization at this time.  The following facilities have been contacted to seek placement for this pt, with results as noted:  Beds available, information sent, decision pending:  [facility]:  [facility] (dementia is exclusionary) [facility] (dementia is exclusionary) [facility] (dementia is exclusionary) [facility] (not on pt's insurance panel)   At capacity:  [facility]   At 15:57 [facility] calls from [place].  Pt is under consideration there, but would need to be observed in the ED for 24 hours with a repeat head CT thereafter.   Jalene Mullet, [place] Triage Specialist [phone]

## 2016-01-11 ENCOUNTER — Encounter: Payer: Self-pay | Admitting: Neurology

## 2016-01-11 ENCOUNTER — Emergency Department (HOSPITAL_COMMUNITY): Payer: Commercial Managed Care - HMO

## 2016-01-11 DIAGNOSIS — F0391 Unspecified dementia with behavioral disturbance: Secondary | ICD-10-CM | POA: Diagnosis not present

## 2016-01-11 DIAGNOSIS — S0990XA Unspecified injury of head, initial encounter: Secondary | ICD-10-CM | POA: Diagnosis not present
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
## 2016-01-11 NOTE — Progress Notes (Signed)
Wife is aware that pt has been accepted to [facility].  Willette Brace [id number] ED CSW 01/11/2016 7:07 PM
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
## 2016-01-11 NOTE — Progress Notes (Signed)
IVC paperwork was faxed to magistrate. [place] office states that they have received document and it will be served. They state to put [place] in log book.  CSW put original IVC paper in IVC log book.  Willette Brace [id number] ED CSW 01/11/2016 6:59 PM

## 2016-01-11 NOTE — Progress Notes (Signed)
Shirlee Limerick of [place] states pt is welcomed to come to facility as soon as his IVC papers have been served. Also, she states nurse report can be called. CSW made nurse aware.  Nurse Report Number: [id number]  Willette Brace [id number] ED CSW 01/11/2016 7:10 PM

## 2016-01-11 NOTE — Progress Notes (Signed)
Alexander Booth, admissions of Alexander Booth states that pt has been accepted to facility. She states she just needs IVC paperwork.  Accepting physician: Dr.[place] Nurse Report Number: [phone]  Alexander Booth [phone] ED CSW 01/11/2016 5:54 PM

## 2016-01-12 DIAGNOSIS — F0281 Dementia in other diseases classified elsewhere with behavioral disturbance: Secondary | ICD-10-CM | POA: Diagnosis not present

## 2016-01-12 DIAGNOSIS — G4700 Insomnia, unspecified: Secondary | ICD-10-CM | POA: Diagnosis not present

## 2016-01-12 DIAGNOSIS — Z9183 Wandering in diseases classified elsewhere: Secondary | ICD-10-CM | POA: Diagnosis not present

## 2016-01-12 DIAGNOSIS — F419 Anxiety disorder, unspecified: Secondary | ICD-10-CM | POA: Diagnosis not present

## 2016-01-12 DIAGNOSIS — S161XXA Strain of muscle, fascia and tendon at neck level, initial encounter: Secondary | ICD-10-CM | POA: Diagnosis not present

## 2016-01-12 DIAGNOSIS — J439 Emphysema, unspecified: Secondary | ICD-10-CM | POA: Diagnosis not present

## 2016-01-12 DIAGNOSIS — M549 Dorsalgia, unspecified: Secondary | ICD-10-CM | POA: Diagnosis not present

## 2016-01-12 DIAGNOSIS — S060X0A Concussion without loss of consciousness, initial encounter: Secondary | ICD-10-CM | POA: Diagnosis not present

## 2016-01-12 DIAGNOSIS — I10 Essential (primary) hypertension: Secondary | ICD-10-CM | POA: Diagnosis not present

## 2016-01-12 DIAGNOSIS — F319 Bipolar disorder, unspecified: Secondary | ICD-10-CM | POA: Diagnosis not present

## 2016-01-12 DIAGNOSIS — S39012A Strain of muscle, fascia and tendon of lower back, initial encounter: Secondary | ICD-10-CM | POA: Diagnosis not present

## 2016-01-12 DIAGNOSIS — E7800 Pure hypercholesterolemia, unspecified: Secondary | ICD-10-CM | POA: Diagnosis not present

## 2016-01-12 DIAGNOSIS — G301 Alzheimer's disease with late onset: Secondary | ICD-10-CM | POA: Diagnosis not present

## 2016-01-12 DIAGNOSIS — G8929 Other chronic pain: Secondary | ICD-10-CM | POA: Diagnosis not present

## 2016-01-12 DIAGNOSIS — E1140 Type 2 diabetes mellitus with diabetic neuropathy, unspecified: Secondary | ICD-10-CM | POA: Diagnosis not present

## 2016-01-12 DIAGNOSIS — F329 Major depressive disorder, single episode, unspecified: Secondary | ICD-10-CM | POA: Diagnosis not present

## 2016-01-12 DIAGNOSIS — F0391 Unspecified dementia with behavioral disturbance: Secondary | ICD-10-CM | POA: Diagnosis not present

## 2016-01-12 DIAGNOSIS — F17200 Nicotine dependence, unspecified, uncomplicated: Secondary | ICD-10-CM | POA: Diagnosis not present

## 2016-01-12 DIAGNOSIS — E1121 Type 2 diabetes mellitus with diabetic nephropathy: Secondary | ICD-10-CM | POA: Diagnosis not present

## 2016-01-12 NOTE — ED Notes (Signed)
Family at bedside.daughter

## 2016-01-12 NOTE — ED Notes (Signed)
Pt showered

## 2016-01-12 NOTE — ED Notes (Signed)
Patient accepted to [facility].  Called Sheriff at [phone] to arrange transport.  Left message for call back.

## 2016-01-12 NOTE — ED Notes (Signed)
Awaiting sherriff for pick up to go to [facility]

## 2016-01-12 NOTE — ED Notes (Signed)
Dr. Ananias Pilgrim accepting MD at [place], [place]

## 2016-01-13 DIAGNOSIS — E785 Hyperlipidemia, unspecified: Secondary | ICD-10-CM | POA: Insufficient documentation

## 2016-01-13 DIAGNOSIS — G629 Polyneuropathy, unspecified: Secondary | ICD-10-CM | POA: Insufficient documentation

## 2016-01-13 DIAGNOSIS — I10 Essential (primary) hypertension: Secondary | ICD-10-CM | POA: Insufficient documentation

## 2016-01-17 ENCOUNTER — Ambulatory Visit (INDEPENDENT_AMBULATORY_CARE_PROVIDER_SITE_OTHER): Payer: Commercial Managed Care - HMO | Admitting: Internal Medicine

## 2016-01-17 ENCOUNTER — Encounter: Payer: Self-pay | Admitting: Internal Medicine

## 2016-01-17 VITALS — BP 130/64 | HR 66 | Temp 98.0°F | Wt 165.5 lb

## 2016-01-17 DIAGNOSIS — G479 Sleep disorder, unspecified: Secondary | ICD-10-CM | POA: Diagnosis not present

## 2016-01-17 DIAGNOSIS — F513 Sleepwalking [somnambulism]: Secondary | ICD-10-CM | POA: Diagnosis not present

## 2016-01-17 DIAGNOSIS — F0391 Unspecified dementia with behavioral disturbance: Secondary | ICD-10-CM | POA: Diagnosis not present

## 2016-01-17 DIAGNOSIS — F03918 Unspecified dementia, unspecified severity, with other behavioral disturbance: Secondary | ICD-10-CM

## 2016-01-17 NOTE — Progress Notes (Signed)
Pre visit review using our clinic review tool, if applicable. No additional management support is needed unless otherwise documented below in the visit note. 

## 2016-01-17 NOTE — Patient Instructions (Signed)
Dementia Dementia is a word that is used to describe problems with the brain and how it works. People with dementia have memory loss. They may also have problems with thinking, speaking, or solving problems. It can affect how they act around people, how they do their job, their mood, and their personality. These changes may not show up for a long time. Family or friends may not notice problems in the early part of this disease. HOME CARE The following tips are for the person living with, or caring for, the person with dementia. Make the home safe.  Remove locks on bathroom doors.  Use childproof locks on cabinets where alcohol, cleaning supplies, or chemicals are stored.  Put outlet covers in electrical outlets.  Put in childproof locks to keep doors and windows safe.  Remove stove knobs, or put in safety knobs that shut off on their own.  Lower the temperature on water heaters.  Label medicines. Lock them in a safe place.  Keep knives, lighters, matches, power tools, and guns out of reach or in a safe place.  Remove objects that might break or can hurt the person.  Make sure lighting is good inside and outside.  Put in grab bars if needed.  Use a device that detects falls or other needs for help. Lessen confusion.  Keep familiar objects and people around.  Use night lights or low lit (dim) lights at night.  Label objects or areas.  Use reminders, notes, or directions for daily activities or tasks.  Keep a simple routine that is the same for waking, meals, bathing, dressing, and bedtime.  Create a calm and quiet home.  Put up clocks and calendars.  Keep emergency numbers and the home address near all phones.  Help show the different times of day. Open the curtains during the day to let light in. Speak clearly and directly.  Choose simple words and short sentences.  Use a gentle, calm voice.  Do not interrupt.  If the person has a hard time finding a word to  use, give them the word or thought.  Ask 1 question at a time. Give enough time for the person to answer. Repeat the question if the person does not answer. Do things that lessen restlessness.  Provide a comfortable bed.  Have the same bedtime routine every night.  Have a regular walking and activity schedule.  Lessen naps during the day.  Do not let the person drink a lot of caffeine.  Go to events that are not overwhelming. Eat well and drink fluids.  Lessen distractions during meal times and snacks.  Avoid foods that are too hot or too cold.  Watch how the person chews and swallows. This is to make sure they do not choke. Other  Keep all vision, hearing, dental, and medical visits with the doctor.  Only give medicines as told by the doctor.  Watch the person's driving ability. Do not let the person drive if he or she cannot drive safely.  Use a program that helps find a person if they become missing. You may need to register with this program. GET HELP RIGHT AWAY IF:   A fever of 102 F (38.9 C) develops.  Confusion develops or gets worse.  Sleepiness develops or gets worse.  Staying awake is hard to do.  New behavior problems start like mood swings, aggression, and seeing things that are not there.  Problems with balance, speech, or falling develop.  Problems swallowing develop.  Any  problems of another sickness develop. MAKE SURE YOU:  Understand these instructions.  Will watch his or her condition.  Will get help right away if he or she is not doing well or gets worse.   This information is not intended to replace advice given to you by your health care provider. Make sure you discuss any questions you have with your health care provider.   Document Released: 06/20/2008 Document Revised: 09/30/2011 Document Reviewed: 12/03/2010 Elsevier Interactive Patient Education [employer].

## 2016-01-17 NOTE — Progress Notes (Signed)
 Subjective:    Patient ID: Alexander Booth, male    DOB: 09/30/1945, 70 y.o.   MRN: [id number]  HPI  Pt presents to the clinic today for follow up from admission to [facility]. He was involuntary committed for wandering, dementia with behavioral disturbance, and a threat of being harmful to others. While there, his Prozac was stopped and his Norco was decreased (family felt that he was taking too many narcotics). He was prescribed Remeron 45 mg QHS, Lidoderm patches and Neurontin was increased to 300 mg BID, 1200 mg QHS. They did not change his Aricept or Depakote. They discontinued his Topamax and Trazadone. He never took the Namenda prescribed by Dr. Athar.  Since he has been home, his behavior has improved. His daughter reports he is still not sleeping well. He follows with Dr. Athar, neurology but family no longer wants to see her. They have an appt with geriatric psychiatry in August.  Review of Systems      Past Medical History  Diagnosis Date  . Hypertension     Essential  . Hypercholesterolemia   . Lumbosacral disc disease     with chronic low back pain  . Bronchitis     Subacute and hx of ongoing cigarette smoking one half pack daily  . Diabetes mellitus     He's also had a hx of high cholesterol   . FHx: heart disease     Positive for   . Dyslipidemia     He also has a past hx of   . Type 2 diabetes mellitus (HCC) 2010    takes oral meds  . Fracture of lumbar vertebra (HCC) 11/25/2012  . Hard of hearing     very little hearing on left side;better in right ear  . Erectile dysfunction     He's had a hx of   . Depression   . Presenile dementia   . Dementia     He has a hx of the   . Peripheral neuropathy (HCC)   . Peripheral neuropathy (HCC)     He also has   . Numbness and tingling in hands     and feet  . Anginal pain (HCC)     Current Outpatient Prescriptions  Medication Sig Dispense Refill  . ACCU-CHEK FASTCLIX LANCETS MISC  CHECK BLOOD SUGAR ONCE DAILY AND AS DIRECTED 102 each 5  . ACCU-CHEK SMARTVIEW test strip CHECK BLOOD SUGAR ONCE DAILY AND AS DIRECTED 50 each 5  . bisacodyl (BISACODYL) 5 MG EC tablet Take 5 mg by mouth daily as needed for mild constipation or moderate constipation.    . bisacodyl (DULCOLAX) 5 MG EC tablet Take by mouth.    . Blood Glucose Monitoring Suppl (ACCU-CHEK NANO SMARTVIEW) W/DEVICE KIT CHECK BLOOD SUGAR ONCE DAILY AND AS DIRECTED. 1 kit 0  . divalproex (DEPAKOTE ER) 500 MG 24 hr tablet TAKE 2 TABLETS EVERY DAY (Patient taking differently: TAKE 2 TABLETS every night at bedtime) 180 tablet 3  . divalproex (DEPAKOTE) 500 MG DR tablet Take by mouth.    . donepezil (ARICEPT) 10 MG tablet Take 1 tablet (10 mg total) by mouth every morning. 90 tablet 3  . econazole nitrate 1 % cream Apply 1 application topically daily. 15 g 2  . FLUoxetine (PROZAC) 20 MG capsule Take 1 capsule (20 mg total) by mouth daily. 90 capsule 3  . gabapentin (NEURONTIN) 300 MG capsule Take 1 capsule by mouth 2 (two) times daily.    .   gabapentin (NEURONTIN) 400 MG capsule Take 3 capsules by mouth at bedtime and may repeat dose one time if needed.    Marland Kitchen HYDROcodone-acetaminophen (NORCO) 7.5-325 MG per tablet Take 1 tablet by mouth every 6 (six) hours as needed for moderate pain. 30 tablet 0  . HYDROcodone-acetaminophen (NORCO/VICODIN) 5-325 MG tablet Take 1 tablet by mouth 2 (two) times daily.    Marland Kitchen ketoconazole (NIZORAL) 2 % shampoo Apply 1 application topically 2 (two) times a week. scalp  2  . lidocaine (LIDODERM) 5 % Place 3 patches onto the skin daily.    . memantine (NAMENDA XR) 7 MG CP24 24 hr capsule Take 1 capsule (7 mg total) by mouth every morning. 30 capsule 5  . metFORMIN (GLUCOPHAGE) 500 MG tablet Take 1 tablet by mouth 2 (two) times daily.    . mirtazapine (REMERON) 45 MG tablet Take by mouth.    . nitroGLYCERIN (NITROSTAT) 0.4 MG SL tablet Place 1 tablet (0.4 mg total) under the tongue every 5 (five)  minutes as needed for chest pain. 25 tablet prn  . nitroGLYCERIN (NITROSTAT) 0.4 MG SL tablet Place under the tongue.    . simvastatin (ZOCOR) 40 MG tablet Take 1 tablet (40 mg total) by mouth daily at 6 PM. (Patient taking differently: Take 40 mg by mouth every other day. ) 30 tablet 6  . simvastatin (ZOCOR) 40 MG tablet Take by mouth.    Marland Kitchen tiZANidine (ZANAFLEX) 4 MG tablet Take 4 mg by mouth every 6 (six) hours as needed for muscle spasms.     Marland Kitchen topiramate (TOPAMAX) 50 MG tablet Take 50 mg by mouth 2 (two) times daily.    . traZODone (DESYREL) 100 MG tablet Take 1.5 tablets (150 mg total) by mouth at bedtime. (Patient taking differently: Take 200 mg by mouth at bedtime. ) 135 tablet 3  . [DISCONTINUED] sildenafil (VIAGRA) 50 MG tablet Take 1 tablet (50 mg total) by mouth as needed. 5 tablet 3   No current facility-administered medications for this visit.    Allergies  Allergen Reactions  . Codeine Hives and Itching    Family History  Problem Relation Age of Onset  . Multiple myeloma Mother   . Diabetes Mother   . Cancer Sister     lymphoma  . Diabetes Sister   . Stroke Neg Hx     Social History   Social History  . Marital Status: Married    Spouse Name: Vaughan Basta  . Number of Children: 3  . Years of Education: 12   Occupational History  .      retired   Social History Main Topics  . Smoking status: Current Every Day Smoker -- 1.00 packs/day for 60 years  . Smokeless tobacco: Never Used  . Alcohol Use: No  . Drug Use: No  . Sexual Activity: Not on file   Other Topics Concern  . Not on file   Social History Narrative   Patient is right handed   Patient resides with wife,consumes 2-3 cups of caffeine daily     Constitutional: Pt reports weight loss. Denies fever, malaise, fatigue, headache or abrupt weight changes.  Neurological: Pt reports insomnia. Denies dizziness, difficulty with memory, difficulty with speech or problems with balance and coordination.  Psych:  Pt reports intermittent agitation. Denies anxiety, depression, SI/HI.  No other specific complaints in a complete review of systems (except as listed in HPI above).  Objective:   Physical Exam    BP 130/64 mmHg  Pulse 66  Temp(Src) 98 F (36.7 C) (Oral)  Wt 165 lb 8 oz (75.07 kg)  SpO2 97% Wt Readings from Last 3 Encounters:  01/17/16 165 lb 8 oz (75.07 kg)  12/14/15 172 lb 8 oz (78.245 kg)  10/19/15 175 lb (79.379 kg)    General: Appears his stated age, in NAD. Cardiovascular: Normal rate and rhythm. S1,S2 noted.  No murmur, rubs or gallops noted.  Pulmonary/Chest: Normal effort and positive vesicular breath sounds. No respiratory distress. No wheezes, rales or ronchi noted.  Musculoskeletal: Gait slow but steady with assistance of cane. Neurological: Alert and oriented.  Psychiatric: He is getting slightly agitated during the interview. He does intermittently smile and make eye contact.  BMET    Component Value Date/Time   NA 139 01/09/2016 1432   NA 141 10/11/2014 1122   K 3.9 01/09/2016 1432   CL 104 01/09/2016 1432   CO2 30 01/09/2016 1432   GLUCOSE 93 01/09/2016 1432   GLUCOSE 86 10/11/2014 1122   BUN 9 01/09/2016 1432   BUN 11 10/11/2014 1122   CREATININE 0.91 01/09/2016 1432   CREATININE 0.72 03/26/2013 1608   CALCIUM 9.1 01/09/2016 1432   GFRNONAA >60 01/09/2016 1432   GFRAA >60 01/09/2016 1432    Lipid Panel     Component Value Date/Time   CHOL 155 10/19/2015 0914   TRIG 190.0* 10/19/2015 0914   HDL 34.00* 10/19/2015 0914   CHOLHDL 5 10/19/2015 0914   VLDL 38.0 10/19/2015 0914   LDLCALC 83 10/19/2015 0914    CBC    Component Value Date/Time   WBC 6.0 01/09/2016 1432   WBC 6.2 10/11/2014 1122   RBC 4.07* 01/09/2016 1432   RBC 4.40 10/11/2014 1122   HGB 13.8 01/09/2016 1432   HCT 39.4 01/09/2016 1432   PLT 127* 01/09/2016 1432   MCV 96.8 01/09/2016 1432   MCH 33.9 01/09/2016 1432   MCH 33.2* 10/11/2014 1122   MCHC 35.0 01/09/2016 1432    MCHC 34.6 10/11/2014 1122   RDW 12.4 01/09/2016 1432   RDW 13.5 10/11/2014 1122   LYMPHSABS 1.3 10/11/2014 1122   EOSABS 0.0 10/11/2014 1122   BASOSABS 0.0 10/11/2014 1122    Hgb A1C Lab Results  Component Value Date   HGBA1C 6.1 10/19/2015       Assessment & Plan:   Hospital follow up for wandering, dementia with behavioral disturbance and sleep disturbance:  Hospital notes, labs and imaging reviewed Thorough medication review No medication changes at this time Follow up with geriatric psych in August  Make an appt for your Medicare Wellness Exam 

## 2016-01-18 ENCOUNTER — Other Ambulatory Visit: Payer: Self-pay | Admitting: Internal Medicine

## 2016-01-18 ENCOUNTER — Telehealth: Payer: Self-pay

## 2016-01-18 MED ORDER — LORAZEPAM 0.5 MG PO TABS: 0.5000 mg | ORAL_TABLET | Freq: Every day | ORAL | Status: DC | PRN

## 2016-01-18 NOTE — Telephone Encounter (Signed)
Alexander Booth is aware---Rx called into pharmacy

## 2016-01-18 NOTE — Telephone Encounter (Signed)
Please phone in Ativan for him to take daily prn for episodes of agitation.

## 2016-01-18 NOTE — Telephone Encounter (Signed)
Pt's daughter [name] called----she reports she had been out with him all day helping at the house---when she told patient she needed to leave he got upset and "all hell broke loose"---pt does not want to leave mom there with him in this state---pt was given Ativan in the hospital and she wanted to know if he can take that or the Remeron--please advise

## 2016-01-29 ENCOUNTER — Other Ambulatory Visit: Payer: Self-pay

## 2016-01-29 MED ORDER — GABAPENTIN 400 MG PO CAPS: 1200.0000 mg | ORAL_CAPSULE | Freq: Every evening | ORAL | Status: DC | PRN

## 2016-01-29 MED ORDER — GABAPENTIN 300 MG PO CAPS: 300.0000 mg | ORAL_CAPSULE | Freq: Two times a day (BID) | ORAL | Status: DC

## 2016-01-29 MED ORDER — MIRTAZAPINE 45 MG PO TABS: 45.0000 mg | ORAL_TABLET | Freq: Every day | ORAL | Status: DC

## 2016-01-29 MED ORDER — LORAZEPAM 0.5 MG PO TABS: 0.5000 mg | ORAL_TABLET | Freq: Every day | ORAL | Status: DC | PRN

## 2016-01-29 NOTE — Telephone Encounter (Signed)
Alexander Booth pts daughter(DPR signed) request refill to [facility] for gabapentin 300 mg and 400 mg, lorazepam and mirtazapine. [name] request cb when done. Pt has enough med to last thru Sunday. [name] said has previously been discussed with Avie Echevaria NP that she would refill meds. Pt last seen 01/17/16.

## 2016-01-29 NOTE — Telephone Encounter (Signed)
Medications sent electronically. OK to phone in [place].

## 2016-01-30 NOTE — Telephone Encounter (Signed)
Called [facility] to [facility]---medication is for PRN per 30 day Rx--last Rx #20 was filled 01/18/16--pt should not be out of medication---will call daughter to verify how often pt is taking medication

## 2016-01-30 NOTE — Telephone Encounter (Signed)
I did not notice. Ativan can be refilled on 02/17/16

## 2016-01-31 NOTE — Telephone Encounter (Signed)
Its likely a side effect from the Neurontin. He needs to continue elevation. He can wear compression socks to see if that helps.

## 2016-01-31 NOTE — Telephone Encounter (Signed)
Spoke to pt's daughter Arnette Schaumann is aware that Rx sent to [facility] and Ativan cannot be called into pharmacy until on or after 02/17/2016---she expressed understanding and states that pt has been taking medication once every 2-3 days and seems to be helping  Additionally, she reports that pt has been experiencing intermittent bilateral lower leg edema---[name] states she had been soaking with with epsom salt and trying to have him elevate his legs when she can. What would you suggest pt try or would you prefer pt to come in to be evaluated? Please advise

## 2016-01-31 NOTE — Telephone Encounter (Signed)
Spoke to [place], pt's daughter---she is aware as instructed---will f/u call us if Sx worsen or no improvement

## 2016-02-08 NOTE — Telephone Encounter (Signed)
[name] returned your call She made pt appointment for 7/21 @ 1

## 2016-02-08 NOTE — Telephone Encounter (Signed)
[name] left v/m; due to neuropathy pt cannot wear compression socks; this morning feet are more swollen; [name] wants to know if there can be a medication change or does pt need to schedule appt. [name] request cb.

## 2016-02-08 NOTE — Telephone Encounter (Signed)
He would need an appt. 

## 2016-02-08 NOTE — Telephone Encounter (Signed)
Left detailed msg on VM per HIPAA  

## 2016-02-09 ENCOUNTER — Encounter: Payer: Self-pay | Admitting: Internal Medicine

## 2016-02-09 ENCOUNTER — Ambulatory Visit (INDEPENDENT_AMBULATORY_CARE_PROVIDER_SITE_OTHER): Payer: Commercial Managed Care - HMO | Admitting: Internal Medicine

## 2016-02-09 VITALS — BP 120/70 | HR 76 | Temp 98.1°F | Wt 177.0 lb

## 2016-02-09 DIAGNOSIS — R609 Edema, unspecified: Secondary | ICD-10-CM

## 2016-02-09 MED ORDER — HYDROCHLOROTHIAZIDE 25 MG PO TABS: 25.0000 mg | ORAL_TABLET | Freq: Every day | ORAL | Status: DC

## 2016-02-09 NOTE — Patient Instructions (Signed)
Edema °Edema is an abnormal buildup of fluids in your body tissues. Edema is somewhat dependent on gravity to pull the fluid to the lowest place in your body. That makes the condition more common in the legs and thighs (lower extremities). Painless swelling of the feet and ankles is common and becomes more likely as you get older. It is also common in looser tissues, like around your eyes.  °When the affected area is squeezed, the fluid may move out of that spot and leave a dent for a few moments. This dent is called pitting.  °CAUSES  °There are many possible causes of edema. Eating too much salt and being on your feet or sitting for a long time can cause edema in your legs and ankles. Hot weather may make edema worse. Common medical causes of edema include: °· Heart failure. °· Liver disease. °· Kidney disease. °· Weak blood vessels in your legs. °· Cancer. °· An injury. °· Pregnancy. °· Some medications. °· Obesity.  °SYMPTOMS  °Edema is usually painless. Your skin may look swollen or shiny.  °DIAGNOSIS  °Your health care provider may be able to diagnose edema by asking about your medical history and doing a physical exam. You may need to have tests such as X-rays, an electrocardiogram, or blood tests to check for medical conditions that may cause edema.  °TREATMENT  °Edema treatment depends on the cause. If you have heart, liver, or kidney disease, you need the treatment appropriate for these conditions. General treatment may include: °· Elevation of the affected body part above the level of your heart. °· Compression of the affected body part. Pressure from elastic bandages or support stockings squeezes the tissues and forces fluid back into the blood vessels. This keeps fluid from entering the tissues. °· Restriction of fluid and salt intake. °· Use of a water pill (diuretic). These medications are appropriate only for some types of edema. They pull fluid out of your body and make you urinate more often. This  gets rid of fluid and reduces swelling, but diuretics can have side effects. Only use diuretics as directed by your health care provider. °HOME CARE INSTRUCTIONS  °· Keep the affected body part above the level of your heart when you are lying down.   °· Do not sit still or stand for prolonged periods.   °· Do not put anything directly under your knees when lying down. °· Do not wear constricting clothing or garters on your upper legs.   °· Exercise your legs to work the fluid back into your blood vessels. This may help the swelling go down.   °· Wear elastic bandages or support stockings to reduce ankle swelling as directed by your health care provider.   °· Eat a low-salt diet to reduce fluid if your health care provider recommends it.   °· Only take medicines as directed by your health care provider.  °SEEK MEDICAL CARE IF:  °· Your edema is not responding to treatment. °· You have heart, liver, or kidney disease and notice symptoms of edema. °· You have edema in your legs that does not improve after elevating them.   °· You have sudden and unexplained weight gain. °SEEK IMMEDIATE MEDICAL CARE IF:  °· You develop shortness of breath or chest pain.   °· You cannot breathe when you lie down. °· You develop pain, redness, or warmth in the swollen areas.   °· You have heart, liver, or kidney disease and suddenly get edema. °· You have a fever and your symptoms suddenly get worse. °MAKE SURE YOU:  °·   Understand these instructions. °· Will watch your condition. °· Will get help right away if you are not doing well or get worse. °  °This information is not intended to replace advice given to you by your health care provider. Make sure you discuss any questions you have with your health care provider. °  °Document Released: 07/08/2005 Document Revised: 07/29/2014 Document Reviewed: 04/30/2013 °Elsevier Interactive Patient Education ©2016 [employer] Inc[employer] ° °

## 2016-02-09 NOTE — Progress Notes (Signed)
Subjective:    Patient ID: Alexander Booth, male    DOB: Dec 09, 1945, 70 y.o.   MRN: [id number]  HPI  Pt presents to the clinic today, brought in by his daughter, with concerns of swelling in his feet. They noticed this 2 weeks ago. The swelling starts in his toes and goes all the way up to his knees. The swelling is not painful but it is interfering with his ability to walk. He has tried elevation, and soaking his feet in Epsom salts without any relief. He is on Neurontin for peripheral neuropathy. They have compression hose but report he can not wear them because they make his peripheral neuropathy worse. His kidney function is normal. He has not PMH of heart failure, no ECHO in Epic to review.  Review of Systems  Past Medical History  Diagnosis Date  . Hypertension     Essential  . Hypercholesterolemia   . Lumbosacral disc disease     with chronic low back pain  . Bronchitis     Subacute and hx of ongoing cigarette smoking one half pack daily  . Diabetes mellitus     He's also had a hx of high cholesterol   . FHx: heart disease     Positive for   . Dyslipidemia     He also has a past hx of   . Type 2 diabetes mellitus ([place]) 2010    takes oral meds  . Fracture of lumbar vertebra ([place]) 11/25/2012  . Hard of hearing     very little hearing on left side;better in right ear  . Erectile dysfunction     He's had a hx of   . Depression   . Presenile dementia   . Dementia     He has a hx of the   . Peripheral neuropathy ([place])   . Peripheral neuropathy [facility])     He also has   . Numbness and tingling in hands     and feet  . Anginal pain [facility])     Current Outpatient Prescriptions  Medication Sig Dispense Refill  . ACCU-CHEK FASTCLIX LANCETS MISC CHECK BLOOD SUGAR ONCE DAILY AND AS DIRECTED 102 each 5  . ACCU-CHEK SMARTVIEW test strip CHECK BLOOD SUGAR ONCE DAILY AND AS DIRECTED 50 each 5  . bisacodyl (BISACODYL) 5 MG EC tablet Take 5 mg by mouth daily as needed for mild  constipation or moderate constipation.    . bisacodyl (DULCOLAX) 5 MG EC tablet Take by mouth.    . Blood Glucose Monitoring Suppl (ACCU-CHEK NANO SMARTVIEW) W/DEVICE KIT CHECK BLOOD SUGAR ONCE DAILY AND AS DIRECTED. 1 kit 0  . divalproex (DEPAKOTE ER) 500 MG 24 hr tablet TAKE 2 TABLETS EVERY DAY (Patient taking differently: TAKE 2 TABLETS every night at bedtime) 180 tablet 3  . divalproex (DEPAKOTE) 500 MG DR tablet Take by mouth.    . donepezil (ARICEPT) 10 MG tablet Take 1 tablet (10 mg total) by mouth every morning. 90 tablet 3  . econazole nitrate 1 % cream Apply 1 application topically daily. 15 g 2  . FLUoxetine (PROZAC) 20 MG capsule Take 1 capsule (20 mg total) by mouth daily. 90 capsule 3  . gabapentin (NEURONTIN) 300 MG capsule Take 1 capsule (300 mg total) by mouth 2 (two) times daily. 180 capsule 0  . gabapentin (NEURONTIN) 400 MG capsule Take 3 capsules (1,200 mg total) by mouth at bedtime and may repeat dose one time if needed. 360 capsule 0  .  HYDROcodone-acetaminophen (NORCO) 7.5-325 MG per tablet Take 1 tablet by mouth every 6 (six) hours as needed for moderate pain. 30 tablet 0  . ketoconazole (NIZORAL) 2 % shampoo Apply 1 application topically 2 (two) times a week. scalp  2  . lidocaine (LIDODERM) 5 % Place 3 patches onto the skin daily.    Marland Kitchen LORazepam (ATIVAN) 0.5 MG tablet Take 1 tablet (0.5 mg total) by mouth daily as needed for anxiety. 20 tablet 0  . memantine (NAMENDA XR) 7 MG CP24 24 hr capsule Take 1 capsule (7 mg total) by mouth every morning. 30 capsule 5  . metFORMIN (GLUCOPHAGE) 500 MG tablet Take 1 tablet by mouth 2 (two) times daily.    . mirtazapine (REMERON) 45 MG tablet Take 1 tablet (45 mg total) by mouth at bedtime. 90 tablet 0  . nitroGLYCERIN (NITROSTAT) 0.4 MG SL tablet Place 1 tablet (0.4 mg total) under the tongue every 5 (five) minutes as needed for chest pain. 25 tablet prn  . nitroGLYCERIN (NITROSTAT) 0.4 MG SL tablet Place under the tongue.    .  simvastatin (ZOCOR) 40 MG tablet Take 1 tablet (40 mg total) by mouth daily at 6 PM. (Patient taking differently: Take 40 mg by mouth every other day. ) 30 tablet 6  . simvastatin (ZOCOR) 40 MG tablet Take by mouth.    Marland Kitchen tiZANidine (ZANAFLEX) 4 MG tablet Take 4 mg by mouth every 6 (six) hours as needed for muscle spasms.     Marland Kitchen topiramate (TOPAMAX) 50 MG tablet Take 50 mg by mouth 2 (two) times daily.    . traZODone (DESYREL) 100 MG tablet Take 1.5 tablets (150 mg total) by mouth at bedtime. (Patient taking differently: Take 200 mg by mouth at bedtime. ) 135 tablet 3  . [DISCONTINUED] sildenafil (VIAGRA) 50 MG tablet Take 1 tablet (50 mg total) by mouth as needed. 5 tablet 3   No current facility-administered medications for this visit.    Allergies  Allergen Reactions  . Codeine Hives and Itching    Family History  Problem Relation Age of Onset  . Multiple myeloma Mother   . Diabetes Mother   . Cancer Sister     lymphoma  . Diabetes Sister   . Stroke Neg Hx     Social History   Social History  . Marital Status: Married    Spouse Name: Vaughan Basta  . Number of Children: 3  . Years of Education: 12   Occupational History  .      retired   Social History Main Topics  . Smoking status: Current Every Day Smoker -- 1.00 packs/day for 60 years  . Smokeless tobacco: Never Used  . Alcohol Use: No  . Drug Use: No  . Sexual Activity: Not on file   Other Topics Concern  . Not on file   Social History Narrative   Patient is right handed   Patient resides with wife,consumes 2-3 cups of caffeine daily     Constitutional: Denies fever, malaise, fatigue, headache or abrupt weight changes.  Respiratory: Denies difficulty breathing, shortness of breath, cough or sputum production.   Cardiovascular: Pt reports swelling of his feet. Denies chest pain, chest tightness, palpitations or swelling in the hands.  Skin: Denies redness, rashes, lesions or ulcercations.  Neurological: Pt reports  numbness and burning in his feet. Denies dizziness, difficulty with memory, difficulty with speech or problems with balance and coordination.    No other specific complaints in a complete review of  systems (except as listed in HPI above).     Objective:   Physical Exam  BP 120/70 mmHg  Pulse 76  Temp(Src) 98.1 F (36.7 C) (Oral)  Wt 177 lb (80.287 kg)  SpO2 94% Wt Readings from Last 3 Encounters:  02/09/16 177 lb (80.287 kg)  01/17/16 165 lb 8 oz (75.07 kg)  12/14/15 172 lb 8 oz (78.245 kg)    General: Appears his stated age, well developed, well nourished in NAD. Skin: Warm, dry and intact. Redness noted of bilateral feet, no warmth. Cardiovascular: Normal rate and rhythm. S1,S2 noted.  No murmur, rubs or gallops noted. 2+ pitting BLE edema. Pedal pulses 1+ bilaterally. Pulmonary/Chest: Normal effort and positive vesicular breath sounds. No respiratory distress. No wheezes, rales or ronchi noted.  Neurological: Alert and oriented.    BMET    Component Value Date/Time   NA 139 01/09/2016 1432   NA 141 10/11/2014 1122   K 3.9 01/09/2016 1432   CL 104 01/09/2016 1432   CO2 30 01/09/2016 1432   GLUCOSE 93 01/09/2016 1432   GLUCOSE 86 10/11/2014 1122   BUN 9 01/09/2016 1432   BUN 11 10/11/2014 1122   CREATININE 0.91 01/09/2016 1432   CREATININE 0.72 03/26/2013 1608   CALCIUM 9.1 01/09/2016 1432   GFRNONAA >60 01/09/2016 1432   GFRAA >60 01/09/2016 1432    Lipid Panel     Component Value Date/Time   CHOL 155 10/19/2015 0914   TRIG 190.0* 10/19/2015 0914   HDL 34.00* 10/19/2015 0914   CHOLHDL 5 10/19/2015 0914   VLDL 38.0 10/19/2015 0914   LDLCALC 83 10/19/2015 0914    CBC    Component Value Date/Time   WBC 6.0 01/09/2016 1432   WBC 6.2 10/11/2014 1122   RBC 4.07* 01/09/2016 1432   RBC 4.40 10/11/2014 1122   HGB 13.8 01/09/2016 1432   HCT 39.4 01/09/2016 1432   PLT 127* 01/09/2016 1432   MCV 96.8 01/09/2016 1432   MCH 33.9 01/09/2016 1432   MCH 33.2*  10/11/2014 1122   MCHC 35.0 01/09/2016 1432   MCHC 34.6 10/11/2014 1122   RDW 12.4 01/09/2016 1432   RDW 13.5 10/11/2014 1122   LYMPHSABS 1.3 10/11/2014 1122   EOSABS 0.0 10/11/2014 1122   BASOSABS 0.0 10/11/2014 1122    Hgb A1C Lab Results  Component Value Date   HGBA1C 6.1 10/19/2015         Assessment & Plan:   Peripheral edema:  No evidence of heart failure on exam Continue elevation eRx for HCTZ 25 mg daily Will check BMET in 2 weeks at [facility] [facility]  Exam  Return precautions given. RTC in 2 weeks

## 2016-02-09 NOTE — Progress Notes (Signed)
Pre visit review using our clinic review tool, if applicable. No additional management support is needed unless otherwise documented below in the visit note. 

## 2016-02-20 NOTE — Telephone Encounter (Signed)
Alexander Booth (DPR signed) left v/m wanting to verify that [employer] sent Ativan at end of July to [facility]. ?  Alexander Booth is giving pt at least one Ativan daily. Pt only has 3 pills left and request this month [facility] and then thereafter send to [facility]. Alexander Booth request cb. DO NOT CALL PT.

## 2016-02-20 NOTE — Addendum Note (Signed)
Addended by: Helene Shoe on: 02/20/2016 11:55 AM   Modules accepted: Orders

## 2016-02-21 NOTE — Telephone Encounter (Signed)
No, if he is having behavioral issues, he needs to go see the neuropsychologist to have them change medication around.

## 2016-02-21 NOTE — Telephone Encounter (Signed)
I called and lmovm on pharmacy Fri to have medication filled on or before 02/17/16---they told me they did not have any on file---called Rx into pharmacy---[facility] does not send controlled substances to mail order....spoke to daughter and she is aware that the medication would have to go to local pharmacy.... [name] states that this past week she has had to give pt pill 2 times in one day---mood swings are happening more frequent---she wants to know if the quantity can be increased a little to help with the days he has to take more than one--please advise--- I advised pt to hold off on picking up Rx as he has 2 left just in case there is an increase I will let her know if there will be any changes

## 2016-02-26 ENCOUNTER — Encounter: Payer: Self-pay | Admitting: Internal Medicine

## 2016-02-26 ENCOUNTER — Ambulatory Visit (INDEPENDENT_AMBULATORY_CARE_PROVIDER_SITE_OTHER): Payer: Commercial Managed Care - HMO | Admitting: Internal Medicine

## 2016-02-26 ENCOUNTER — Telehealth: Payer: Self-pay

## 2016-02-26 VITALS — BP 124/74 | HR 71 | Temp 98.1°F | Ht 73.0 in | Wt 174.8 lb

## 2016-02-26 DIAGNOSIS — Z0000 Encounter for general adult medical examination without abnormal findings: Secondary | ICD-10-CM | POA: Diagnosis not present

## 2016-02-26 DIAGNOSIS — R600 Localized edema: Secondary | ICD-10-CM

## 2016-02-26 DIAGNOSIS — R609 Edema, unspecified: Secondary | ICD-10-CM

## 2016-02-26 DIAGNOSIS — J438 Other emphysema: Secondary | ICD-10-CM | POA: Diagnosis not present

## 2016-02-26 DIAGNOSIS — Z125 Encounter for screening for malignant neoplasm of prostate: Secondary | ICD-10-CM | POA: Diagnosis not present

## 2016-02-26 DIAGNOSIS — G4700 Insomnia, unspecified: Secondary | ICD-10-CM

## 2016-02-26 DIAGNOSIS — F0391 Unspecified dementia with behavioral disturbance: Secondary | ICD-10-CM

## 2016-02-26 DIAGNOSIS — I119 Hypertensive heart disease without heart failure: Secondary | ICD-10-CM

## 2016-02-26 DIAGNOSIS — F03918 Unspecified dementia, unspecified severity, with other behavioral disturbance: Secondary | ICD-10-CM

## 2016-02-26 DIAGNOSIS — E1142 Type 2 diabetes mellitus with diabetic polyneuropathy: Secondary | ICD-10-CM | POA: Diagnosis not present

## 2016-02-26 DIAGNOSIS — M549 Dorsalgia, unspecified: Secondary | ICD-10-CM

## 2016-02-26 DIAGNOSIS — G8929 Other chronic pain: Secondary | ICD-10-CM

## 2016-02-26 DIAGNOSIS — E7800 Pure hypercholesterolemia, unspecified: Secondary | ICD-10-CM

## 2016-02-26 LAB — COMPREHENSIVE METABOLIC PANEL
ALBUMIN: 3.8 g/dL (ref 3.5–5.2)
ALK PHOS: 59 U/L (ref 39–117)
ALT: 5 U/L (ref 0–53)
AST: 14 U/L (ref 0–37)
BUN: 9 mg/dL (ref 6–23)
CO2: 33 mEq/L — ABNORMAL HIGH (ref 19–32)
Calcium: 9.5 mg/dL (ref 8.4–10.5)
Chloride: 102 mEq/L (ref 96–112)
Creatinine, Ser: 0.82 mg/dL (ref 0.40–1.50)
GFR: 98.06 mL/min (ref >60.00)
Glucose, Bld: 103 mg/dL — ABNORMAL HIGH (ref 70–99)
POTASSIUM: 4.8 meq/L (ref 3.5–5.1)
Sodium: 141 mEq/L (ref 135–145)
TOTAL PROTEIN: 6.3 g/dL (ref 6.0–8.3)
Total Bilirubin: 0.5 mg/dL (ref 0.2–1.2)

## 2016-02-26 LAB — MICROALBUMIN / CREATININE URINE RATIO
CREATININE, U: 90 mg/dL
MICROALB/CREAT RATIO: 0.8 mg/g (ref 0.0–30.0)

## 2016-02-26 LAB — LIPID PANEL
CHOLESTEROL: 176 mg/dL (ref 0–200)
HDL: 35.6 mg/dL — AB (ref >39.00)
LDL Cholesterol: 103 mg/dL — ABNORMAL HIGH (ref 0–99)
NonHDL: 140.06
Total CHOL/HDL Ratio: 5
Triglycerides: 184 mg/dL — ABNORMAL HIGH (ref 0.0–149.0)
VLDL: 36.8 mg/dL (ref 0.0–40.0)

## 2016-02-26 LAB — CBC
HEMATOCRIT: 38 % — AB (ref 39.0–52.0)
Hemoglobin: 12.9 g/dL — ABNORMAL LOW (ref 13.0–17.0)
MCHC: 33.9 g/dL (ref 30.0–36.0)
MCV: 99.7 fl (ref 78.0–100.0)
Platelets: 220 10*3/uL (ref 150.0–400.0)
RBC: 3.81 Mil/uL — AB (ref 4.22–5.81)
RDW: 12.9 % (ref 11.5–15.5)
WBC: 6 10*3/uL (ref 4.0–10.5)

## 2016-02-26 LAB — HEMOGLOBIN A1C: HEMOGLOBIN A1C: 5.9 % (ref 4.6–6.5)

## 2016-02-26 LAB — PSA, MEDICARE: PSA: 1.56 ng/ml (ref 0.10–4.00)

## 2016-02-26 MED ORDER — PREDNISONE 10 MG PO TABS
ORAL_TABLET | ORAL | 0 refills | Status: DC
Start: 2016-02-26 — End: 2016-03-12

## 2016-02-26 MED ORDER — BUDESONIDE 90 MCG/ACT IN AEPB: 1.0000 | INHALATION_SPRAY | Freq: Two times a day (BID) | RESPIRATORY_TRACT | 2 refills | Status: DC

## 2016-02-26 MED ORDER — AZITHROMYCIN 250 MG PO TABS: ORAL_TABLET | ORAL | 0 refills | Status: DC

## 2016-02-26 MED ORDER — MEMANTINE HCL ER 7 MG PO CP24: 7.0000 mg | ORAL_CAPSULE | Freq: Every morning | ORAL | 5 refills | Status: DC

## 2016-02-26 NOTE — Assessment & Plan Note (Signed)
Sleeping well with Remeron

## 2016-02-26 NOTE — Patient Instructions (Signed)

## 2016-02-26 NOTE — Telephone Encounter (Signed)
Pt was seen in office and has been addressed at [place]

## 2016-02-26 NOTE — Assessment & Plan Note (Signed)
Encouraged smoking cessation He is having an exacerbation today eRx for Pred Taper and Azithromax eRx for Pulmicort to start BID after Pred Taper finished

## 2016-02-26 NOTE — Progress Notes (Signed)
HPI:  Pt presents to the clinic today for his medicare wellness exam. He is also due for follow up of chronic conditions.  HTN: BP today is 138/68. He was following with Dr. Mare Ferrari, who recently retired. Note from 07/2015 reviewed- Digoxin was stopped. He is on HCTZ daily. He still c/o lower extremity edema and muscle cramps in his legs. ECG from 11/2014 reviewed.   HLD: His last LDL was 83. He denies myalgias on Zocor. He does try to consume a low fat diet.   Dementia without behavioral disturbance: He follows with Dr. Rexene Alberts- note from 10/10/15 reviewed. He is taking Aricept,and Depakote. Namenda was added to his regimen, but he reports he never started taking it. He was also referred to geriatric psychiatry for worsening moods, he has an appt August 14th . He no longer drives. He is capable of all ADL's at this time.   Chronic back pain, secondary to OA: He got a spinal cord stimulator place by Dr. Vertell Limber 11/2014. He reports his back hurts him all the time. He follows with Dr.Harkins. He takes Norco and Zanaflex for pain.   DM 2 with Neuropathy: His last A1C was 6.1%. He is not checking his sugars. He is taking Metformin as prescribed.  He is getting yearly eye exam. Flu and pneumonia shots are UTD. He does have issues with neuropathy in his feet and is taking Neurontin as prescribed, but he is not sure it helps.   Sleep Disturbance: Controlled with Remeron at night.   Emphysema: He continues to smoke. He reports worsening cough and shortness of breath. The cough is productive of yellow mucous. He denies fever but has had chills and body aches. He has not tried anything OTC.   Past Medical History:  Diagnosis Date  . Anginal pain ([place])   . Bronchitis    Subacute and hx of ongoing cigarette smoking one half pack daily  . Dementia    He has a hx of the   . Depression   . Diabetes mellitus    He's also had a hx of high cholesterol   . Dyslipidemia    He also has a past hx of   . Erectile  dysfunction    He's had a hx of   . FHx: heart disease    Positive for   . Fracture of lumbar vertebra ([place]) 11/25/2012  . Hard of hearing    very little hearing on left side;better in right ear  . Hypercholesterolemia   . Hypertension    Essential  . Lumbosacral disc disease    with chronic low back pain  . Numbness and tingling in hands    and feet  . Peripheral neuropathy ([place])   . Peripheral neuropathy [facility])    He also has   . Presenile dementia   . Type 2 diabetes mellitus ([place]) 2010   takes oral meds    Current Outpatient Prescriptions  Medication Sig Dispense Refill  . ACCU-CHEK FASTCLIX LANCETS MISC CHECK BLOOD SUGAR ONCE DAILY AND AS DIRECTED 102 each 5  . ACCU-CHEK SMARTVIEW test strip CHECK BLOOD SUGAR ONCE DAILY AND AS DIRECTED 50 each 5  . bisacodyl (BISACODYL) 5 MG EC tablet Take 5 mg by mouth daily as needed for mild constipation or moderate constipation.    . bisacodyl (DULCOLAX) 5 MG EC tablet Take by mouth.    . Blood Glucose Monitoring Suppl (ACCU-CHEK NANO SMARTVIEW) W/DEVICE KIT CHECK BLOOD SUGAR ONCE DAILY AND AS DIRECTED. 1 kit 0  .  divalproex (DEPAKOTE ER) 500 MG 24 hr tablet TAKE 2 TABLETS EVERY DAY (Patient taking differently: TAKE 2 TABLETS every night at bedtime) 180 tablet 3  . divalproex (DEPAKOTE) 500 MG DR tablet Take by mouth.    . donepezil (ARICEPT) 10 MG tablet Take 1 tablet (10 mg total) by mouth every morning. 90 tablet 3  . econazole nitrate 1 % cream Apply 1 application topically daily. 15 g 2  . gabapentin (NEURONTIN) 300 MG capsule Take 1 capsule (300 mg total) by mouth 2 (two) times daily. 180 capsule 0  . gabapentin (NEURONTIN) 400 MG capsule Take 3 capsules (1,200 mg total) by mouth at bedtime and may repeat dose one time if needed. 360 capsule 0  . hydrochlorothiazide (HYDRODIURIL) 25 MG tablet Take 1 tablet (25 mg total) by mouth daily. 30 tablet 2  . HYDROcodone-acetaminophen (NORCO) 7.5-325 MG per tablet Take 1 tablet by mouth every 6  (six) hours as needed for moderate pain. 30 tablet 0  . ketoconazole (NIZORAL) 2 % shampoo Apply 1 application topically 2 (two) times a week. scalp  2  . LORazepam (ATIVAN) 0.5 MG tablet Take 1 tablet (0.5 mg total) by mouth daily as needed for anxiety. 20 tablet 0  . memantine (NAMENDA XR) 7 MG CP24 24 hr capsule Take 1 capsule (7 mg total) by mouth every morning. 30 capsule 5  . metFORMIN (GLUCOPHAGE) 500 MG tablet Take 1 tablet by mouth 2 (two) times daily.    . mirtazapine (REMERON) 45 MG tablet Take 1 tablet (45 mg total) by mouth at bedtime. 90 tablet 0  . nitroGLYCERIN (NITROSTAT) 0.4 MG SL tablet Place 1 tablet (0.4 mg total) under the tongue every 5 (five) minutes as needed for chest pain. 25 tablet prn  . nitroGLYCERIN (NITROSTAT) 0.4 MG SL tablet Place under the tongue.    . simvastatin (ZOCOR) 40 MG tablet Take 1 tablet (40 mg total) by mouth daily at 6 PM. (Patient taking differently: Take 40 mg by mouth every other day. ) 30 tablet 6  . simvastatin (ZOCOR) 40 MG tablet Take by mouth.    Marland Kitchen tiZANidine (ZANAFLEX) 4 MG tablet Take 4 mg by mouth every 6 (six) hours as needed for muscle spasms.      No current facility-administered medications for this visit.     Allergies  Allergen Reactions  . Codeine Hives and Itching    Family History  Problem Relation Age of Onset  . Multiple myeloma Mother   . Diabetes Mother   . Cancer Sister     lymphoma  . Diabetes Sister   . Stroke Neg Hx     Social History   Social History  . Marital status: Married    Spouse name: Vaughan Basta  . Number of children: 3  . Years of education: 12   Occupational History  .      retired   Social History Main Topics  . Smoking status: Current Every Day Smoker    Packs/day: 1.00    Years: 60.00  . Smokeless tobacco: Never Used  . Alcohol use No  . Drug use: No  . Sexual activity: Not on file   Other Topics Concern  . Not on file   Social History Narrative   Patient is right handed    Patient resides with wife,consumes 2-3 cups of caffeine daily    Hospitiliaztions: None  Health Maintenance:    Flu: 04/2015  Tetanus: 08/2012  Pneumovax: 08/2012  Prevnar: 09/2015  Zostavax: 09/2012  PSA Screening: 10/2014  Colonoscopy: 02/2015  Eye Doctor: scheduled next month  Dental Exam: no, dentures  Providers:    PCP: Webb Silversmith, NP-C  Neurology: Dr. Rexene Alberts  Neurosurgeon: Dr. [name]  I have personally reviewed and have noted:  1. The patient's medical and social history 2. Their use of alcohol, tobacco or illicit drugs 3. Their current medications and supplements 4. The patient's functional ability including ADL's, fall risks, home  safety risks and hearing or visual impairment. 5. Diet and physical activities 6. Evidence for depression or mood disorder  Subjective:   Review of Systems:   Constitutional: Pt reports fatigue. Denies fever, malaise, headache or abrupt weight changes.  HEENT: Denies eye pain, eye redness, ear pain, ringing in the ears, wax buildup, runny nose, nasal congestion, bloody nose, or sore throat. Respiratory: Denies difficulty breathing, shortness of breath, cough or sputum production.   Cardiovascular: Pt reports lower extremity edema. Denies chest pain, chest tightness, palpitations or swelling in the hands.  Gastrointestinal: Denies abdominal pain, bloating, constipation, diarrhea or blood in the stool.  GU: Denies urgency, frequency, pain with urination, burning sensation, blood in urine, odor or discharge. Musculoskeletal: Pt reports back pain. Denies muscle pain or joint pain and swelling.  Skin: Denies redness, rashes, or ulcercations.  Neurological: Pt reports trouble with balance. Denies dizziness, difficulty with speech. Psych: Pt denies anxiety, depression, SI/HI.  No other specific complaints in a complete review of systems (except as listed in HPI above).  Objective:  PE:   BP 124/74   Pulse 71   Temp 98.1 F (36.7 C)  (Oral)   Ht '6\' 1"'  (1.854 m)   Wt 174 lb 12 oz (79.3 kg)   SpO2 95%   BMI 23.06 kg/m    Wt Readings from Last 3 Encounters:  02/09/16 177 lb (80.3 kg)  01/17/16 165 lb 8 oz (75.1 kg)  12/14/15 172 lb 8 oz (78.2 kg)    General: Appears his stated age, well developed, well nourished in NAD. Skin: Vascular changes to BLE. Cardiovascular: Normal rate and rhythm. S1,S2 noted.  No murmur, rubs or gallops noted. 1 + pitting BLE edema. No carotid bruits noted. 1+ pedal pulses bilaterally. Pulmonary/Chest: Normal effort. Bilateral expiratory wheezes and scattered rhonchi in the bases. No respiratory distress.  MSK: Normal flexion and extension of the knees. Joints enlarged but not swollen. He denies difficulty with gait. Neuro: Decreased sensation to bilateral feet. Alert and oriented. Memory is intact. Psych: Engages and pleasant.  BMET    Component Value Date/Time   NA 139 01/09/2016 1432   NA 141 10/11/2014 1122   K 3.9 01/09/2016 1432   CL 104 01/09/2016 1432   CO2 30 01/09/2016 1432   GLUCOSE 93 01/09/2016 1432   BUN 9 01/09/2016 1432   BUN 11 10/11/2014 1122   CREATININE 0.91 01/09/2016 1432   CREATININE 0.72 03/26/2013 1608   CALCIUM 9.1 01/09/2016 1432   GFRNONAA >60 01/09/2016 1432   GFRAA >60 01/09/2016 1432    Lipid Panel     Component Value Date/Time   CHOL 155 10/19/2015 0914   TRIG 190.0 (H) 10/19/2015 0914   HDL 34.00 (L) 10/19/2015 0914   CHOLHDL 5 10/19/2015 0914   VLDL 38.0 10/19/2015 0914   LDLCALC 83 10/19/2015 0914    CBC    Component Value Date/Time   WBC 6.0 01/09/2016 1432   RBC 4.07 (L) 01/09/2016 1432   HGB 13.8 01/09/2016 1432   HCT 39.4 01/09/2016 1432  PLT 127 (L) 01/09/2016 1432   MCV 96.8 01/09/2016 1432   MCH 33.9 01/09/2016 1432   MCHC 35.0 01/09/2016 1432   RDW 12.4 01/09/2016 1432   RDW 13.5 10/11/2014 1122   LYMPHSABS 1.3 10/11/2014 1122   EOSABS 0.0 10/11/2014 1122   BASOSABS 0.0 10/11/2014 1122    Hgb A1C Lab Results   Component Value Date   HGBA1C 6.1 10/19/2015      Assessment and Plan:   Medicare Annual Wellness Visit:  Diet: He does not eat meat. He does eat some salad. He does not consume fried foods. He does drink some water, but mostly soda, tea and coffee. Physical activity: Sedentary, he does work in the yard and [employer]. Depression/mood screen: Negative Hearing: Decreased Visual acuity: Blurred vision, performs annual eye exam  ADLs: Capable Fall risk: High Home safety: Good Cognitive evaluation: History of dementia EOL planning: Adv directives, DNR/I agree  Preventative Medicine: All HM UTD. Will check CBC, CMET, Lipid, A1C, microalbumin and PSA  Today. MOST form filled out, scanned into the chart.  Next appointment: 1 year annual exam.

## 2016-02-26 NOTE — Assessment & Plan Note (Addendum)
He has a nerve stimulator Continue Norco 2 x day Continue Zanaflex 4 x day

## 2016-02-26 NOTE — Assessment & Plan Note (Addendum)
Will check A1C and microalbumin today He has an eye exam scheduled Flu, pneumovax and prevnar UTD Foot exam today, decreased sensation bilaterally Encouraged him to consume a low fat, low carb diet Continue Metformin and Neurontin unless instructed otherwise

## 2016-02-26 NOTE — Assessment & Plan Note (Signed)
Encouraged him to consume a low fat diet Continue Zocor

## 2016-02-26 NOTE — Progress Notes (Signed)
Pre visit review using our clinic review tool, if applicable. No additional management support is needed unless otherwise documented below in the visit note. 

## 2016-02-26 NOTE — Assessment & Plan Note (Signed)
Mainly in his back and knees He has a nerve stimulator Continue Norco 2 x day

## 2016-02-26 NOTE — Assessment & Plan Note (Addendum)
?   Memory seems intact He has an appt to see a neuropsychiatrist He may not need Aricept but wait until your appt with neuropsychiatrist Continue Depakote and Ativan for now Advised him to go ahead and start the [place], may help with behavioral issues

## 2016-02-26 NOTE — Telephone Encounter (Signed)
Alexander Booth left v/m asking why inhaler was not called in. I spoke with Stanton Kidney at [facility] and did receive order but budesonide 90 mcg inhaler is not in stock; have ordered and should be in on 02/27/16 2-3 pm. Alexander Booth notified and voiced understanding and will ck with pharmacy on 02/27/16.

## 2016-02-26 NOTE — Assessment & Plan Note (Signed)
BP controlled on HCTZ He does still c/o lower extremity edema Will check CMET today If potassium low, start potassium supplement If kidney function normal, will increase HCTZ to 50 mg daily

## 2016-02-27 ENCOUNTER — Other Ambulatory Visit: Payer: Self-pay | Admitting: Internal Medicine

## 2016-02-27 MED ORDER — FLUTICASONE PROPIONATE HFA 110 MCG/ACT IN AERO: 1.0000 | INHALATION_SPRAY | Freq: Two times a day (BID) | RESPIRATORY_TRACT | 12 refills | Status: DC

## 2016-02-27 NOTE — Telephone Encounter (Addendum)
[name] left v/m that pharmacy notified pt that [facility] will not cover inhaler and request different inhaler sent to [place]. [name] request cb.

## 2016-02-27 NOTE — Telephone Encounter (Signed)
flovent sent to pharmacy

## 2016-02-28 MED ORDER — HYDROCHLOROTHIAZIDE 25 MG PO TABS: 50.0000 mg | ORAL_TABLET | Freq: Every day | ORAL | 1 refills | Status: DC

## 2016-02-28 NOTE — Addendum Note (Signed)
Addended by: Lurlean Nanny on: 02/28/2016 04:44 PM   Modules accepted: Orders

## 2016-03-04 ENCOUNTER — Telehealth: Payer: Self-pay | Admitting: Internal Medicine
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
## 2016-03-04 NOTE — Telephone Encounter (Signed)
Is this for the neuropathic pain or something else?

## 2016-03-04 NOTE — Telephone Encounter (Signed)
Daughter - called - would like referral to [facility] for foot problems  Am appt if possible  [phone] Thank you

## 2016-03-05 ENCOUNTER — Other Ambulatory Visit: Payer: Self-pay | Admitting: Internal Medicine

## 2016-03-05 DIAGNOSIS — M79672 Pain in left foot: Secondary | ICD-10-CM

## 2016-03-05 NOTE — Telephone Encounter (Signed)
Referral placed.

## 2016-03-06 ENCOUNTER — Other Ambulatory Visit: Payer: Self-pay | Admitting: Internal Medicine

## 2016-03-06 NOTE — Telephone Encounter (Signed)
[place] she is aware that the cough may continue and to watch out Sx to worsen and or fever--she is aware order has been placed and a referral coordinator should be giving them a call

## 2016-03-06 NOTE — Telephone Encounter (Signed)
Alexander Booth left v/m requesting cb about referral for [facility]; and pt was taking zpak and prednisone for cough; pt is still coughing and [name] does not want to refill prednisone because it makes pt nervous but [name] request the zpak refilled discussed at appt sent to [place]. [name] request cb.

## 2016-03-06 NOTE — Telephone Encounter (Signed)
The zpack and prednisone should have taking care of the infection. He has COPD, he is going to cough. I have already placed the referral to podiatry as requested. They will call him with an appt.

## 2016-03-12 ENCOUNTER — Ambulatory Visit (INDEPENDENT_AMBULATORY_CARE_PROVIDER_SITE_OTHER): Payer: Commercial Managed Care - HMO | Admitting: Internal Medicine

## 2016-03-12 ENCOUNTER — Encounter: Payer: Self-pay | Admitting: Internal Medicine

## 2016-03-12 ENCOUNTER — Ambulatory Visit (INDEPENDENT_AMBULATORY_CARE_PROVIDER_SITE_OTHER)
Admission: RE | Admit: 2016-03-12 | Discharge: 2016-03-12 | Disposition: A | Discharge status: Home / Self Care | Payer: Commercial Managed Care - HMO | Source: Ambulatory Visit | Attending: Internal Medicine | Admitting: Internal Medicine

## 2016-03-12 VITALS — BP 128/68 | HR 74 | Temp 97.9°F | Wt 176.8 lb

## 2016-03-12 DIAGNOSIS — R0602 Shortness of breath: Secondary | ICD-10-CM

## 2016-03-12 DIAGNOSIS — R05 Cough: Secondary | ICD-10-CM

## 2016-03-12 DIAGNOSIS — J441 Chronic obstructive pulmonary disease with (acute) exacerbation: Secondary | ICD-10-CM

## 2016-03-12 DIAGNOSIS — R059 Cough, unspecified: Secondary | ICD-10-CM

## 2016-03-12 MED ORDER — AMOXICILLIN 875 MG PO TABS: 875.0000 mg | ORAL_TABLET | Freq: Two times a day (BID) | ORAL | 0 refills | Status: DC

## 2016-03-12 MED ORDER — PREDNISONE 10 MG PO TABS: ORAL_TABLET | ORAL | 0 refills | Status: DC

## 2016-03-12 NOTE — Patient Instructions (Signed)
Bronchospasm, Adult A bronchospasm is when the tubes that carry air in and out of your lungs (airways) spasm or tighten. During a bronchospasm it is hard to breathe. This is because the airways get smaller. A bronchospasm can be triggered by:  Allergies. These may be to animals, pollen, food, or mold.  Infection. This is a common cause of bronchospasm.  Exercise.  Irritants. These include pollution, cigarette smoke, strong odors, aerosol sprays, and paint fumes.  Weather changes.  Stress.  Being emotional. HOME CARE   Always have a plan for getting help. Know when to call your doctor and local emergency services (911 in the [place] Know where you can get emergency care.  Only take medicines as told by your doctor.  If you were prescribed an inhaler or nebulizer machine, ask your doctor how to use it correctly. Always use a spacer with your inhaler if you were given one.  Stay calm during an attack. Try to relax and breathe more slowly.  Control your home environment:  Change your heating and air conditioning filter at least once a month.  Limit your use of fireplaces and wood stoves.  Do not  smoke. Do not  allow smoking in your home.  Avoid perfumes and fragrances.  Get rid of pests (such as roaches and mice) and their droppings.  Throw away plants if you see mold on them.  Keep your house clean and dust free.  Replace carpet with wood, tile, or vinyl flooring. Carpet can trap dander and dust.  Use allergy-proof pillows, mattress covers, and box spring covers.  Wash bed sheets and blankets every week in hot water. Dry them in a dryer.  Use blankets that are made of polyester or cotton.  Wash hands frequently. GET HELP IF:  You have muscle aches.  You have chest pain.  The thick spit you spit or cough up (sputum) changes from clear or white to yellow, green, gray, or bloody.  The thick spit you spit or cough up gets thicker.  There are problems that may be  related to the medicine you are given such as:  A rash.  Itching.  Swelling.  Trouble breathing. GET HELP RIGHT AWAY IF:  You feel you cannot breathe or catch your breath.  You cannot stop coughing.  Your treatment is not helping you breathe better.  You have very bad chest pain. MAKE SURE YOU:   Understand these instructions.  Will watch your condition.  Will get help right away if you are not doing well or get worse.   This information is not intended to replace advice given to you by your health care provider. Make sure you discuss any questions you have with your health care provider.   Document Released: 05/05/2009 Document Revised: 07/29/2014 Document Reviewed: 12/29/2012 Elsevier Interactive Patient Education 2016 Elsevier Inc.  

## 2016-03-12 NOTE — Progress Notes (Signed)
HPI  Pt presents to the clinic today with c/o ongoing cough, chest congestion, shortness of breath and chills. This has been going on for 2 weeks. The cough is productive of thick, white mucous. His denies runny nose, or nasal congestion, but has had ear pain, sore throat and hoarseness. He has not taken his temperature but has had chills and sweats. He has not taken anything OTC for this. He does have a history of COPD and was recently started on Flovent bu reports it has not helped. He has not had sick contacts that he is aware of.  Review of Systems      Past Medical History:  Diagnosis Date  . Anginal pain ([place])   . Bronchitis    Subacute and hx of ongoing cigarette smoking one half pack daily  . Dementia    He has a hx of the   . Depression   . Diabetes mellitus    He's also had a hx of high cholesterol   . Dyslipidemia    He also has a past hx of   . Erectile dysfunction    He's had a hx of   . FHx: heart disease    Positive for   . Fracture of lumbar vertebra ([place]) 11/25/2012  . Hard of hearing    very little hearing on left side;better in right ear  . Hypercholesterolemia   . Hypertension    Essential  . Lumbosacral disc disease    with chronic low back pain  . Numbness and tingling in hands    and feet  . Peripheral neuropathy ([place])   . Peripheral neuropathy [facility])    He also has   . Presenile dementia   . Type 2 diabetes mellitus ([place]) 2010   takes oral meds    Family History  Problem Relation Age of Onset  . Multiple myeloma Mother   . Diabetes Mother   . Cancer Sister     lymphoma  . Diabetes Sister   . Stroke Neg Hx     Social History   Social History  . Marital status: Married    Spouse name: Vaughan Basta  . Number of children: 3  . Years of education: 12   Occupational History  .      retired   Social History Main Topics  . Smoking status: Current Every Day Smoker    Packs/day: 1.00    Years: 60.00  . Smokeless tobacco: Never Used  . Alcohol  use No  . Drug use: No  . Sexual activity: Not on file   Other Topics Concern  . Not on file   Social History Narrative   Patient is right handed   Patient resides with wife,consumes 2-3 cups of caffeine daily    Allergies  Allergen Reactions  . Codeine Hives and Itching     Constitutional: Positive fatigue. Denies headache, fever or abrupt weight changes.  HEENT:  Positive ear pain, sore throat. Denies eye redness, eye pain, pressure behind the eyes, facial pain, nasal congestion, ringing in the ears, wax buildup, runny nose or bloody nose. Respiratory: Positive cough and shortness of breath. Denies difficulty breathing.  Cardiovascular: Denies chest pain, chest tightness, palpitations or swelling in the hands or feet.   No other specific complaints in a complete review of systems (except as listed in HPI above).  Objective:   BP 128/68   Pulse 74   Temp 97.9 F (36.6 C) (Oral)   Wt 176 lb 12 oz (  80.2 kg)   SpO2 95%   BMI 23.32 kg/m  Wt Readings from Last 3 Encounters:  03/12/16 176 lb 12 oz (80.2 kg)  02/26/16 174 lb 12 oz (79.3 kg)  02/09/16 177 lb (80.3 kg)     General: Appears his stated age, in NAD. HEENT: Head: normal shape and size, no sinus tenderness noted; Eyes: sclera white, no icterus, conjunctiva pink; Ears: Tm's gray and intact, normal light reflex; Throat/Mouth: Teeth present, mucosa erythematous and moist, no exudate noted, no lesions or ulcerations noted.  Neck: No cervical lymphadenopathy.  Cardiovascular: Normal rate and rhythm. S1,S2 noted.  No murmur, rubs or gallops noted.  Pulmonary/Chest: Normal effort and scattered rhonchi and wheezing throughout. No respiratory distress.       Assessment & Plan:  COPD exacerbation:  Get some rest and drink plenty of water Do salt water gargles for the sore throat Albuterol neb treatment if office Chest xray today eRx for Prednisone x 6 days eRx fof Amoxil BID x 10 days  RTC as needed or if symptoms  persist or worsen.   Webb Silversmith, NP

## 2016-03-13 MED ORDER — ALBUTEROL SULFATE (2.5 MG/3ML) 0.083% IN NEBU
2.5000 mg | INHALATION_SOLUTION | Freq: Once | RESPIRATORY_TRACT | Status: AC
  Administered 2016-03-12: 2.5 mg via RESPIRATORY_TRACT

## 2016-03-13 NOTE — Addendum Note (Signed)
Addended by: Lurlean Nanny on: 03/13/2016 09:39 AM   Modules accepted: Orders

## 2016-03-15 ENCOUNTER — Other Ambulatory Visit: Payer: Self-pay | Admitting: Internal Medicine

## 2016-03-15 MED ORDER — ALBUTEROL SULFATE HFA 108 (90 BASE) MCG/ACT IN AERS: 2.0000 | INHALATION_SPRAY | Freq: Four times a day (QID) | RESPIRATORY_TRACT | 0 refills | Status: DC | PRN

## 2016-03-15 NOTE — Telephone Encounter (Signed)
Pt daughter called. Said pt is not feeling any better. The breathing treatment really helpthe day and night of his appt on Tues.   Alexander Booth wants to know if he can come in today for another breathing treatment before the weekend?  [phone]

## 2016-03-15 NOTE — Addendum Note (Signed)
Addended by: Lurlean Nanny on: 03/15/2016 03:40 PM   Modules accepted: Orders

## 2016-03-15 NOTE — Telephone Encounter (Signed)
Patient's daughter called back.  Please call her back at [phone].

## 2016-03-15 NOTE — Telephone Encounter (Signed)
Daughter states she would like to have the inhaler---already ordered

## 2016-03-15 NOTE — Telephone Encounter (Signed)
We do not do nurse visits for that kind of thing. What about a Albuterol inhaler.

## 2016-03-21 ENCOUNTER — Ambulatory Visit (INDEPENDENT_AMBULATORY_CARE_PROVIDER_SITE_OTHER): Payer: Commercial Managed Care - HMO

## 2016-03-21 ENCOUNTER — Encounter: Payer: Self-pay | Admitting: Podiatry

## 2016-03-21 ENCOUNTER — Ambulatory Visit (INDEPENDENT_AMBULATORY_CARE_PROVIDER_SITE_OTHER): Payer: Commercial Managed Care - HMO | Admitting: Podiatry

## 2016-03-21 VITALS — BP 111/54 | HR 66 | Resp 16

## 2016-03-21 DIAGNOSIS — E119 Type 2 diabetes mellitus without complications: Secondary | ICD-10-CM

## 2016-03-21 DIAGNOSIS — E1142 Type 2 diabetes mellitus with diabetic polyneuropathy: Secondary | ICD-10-CM

## 2016-03-21 DIAGNOSIS — M779 Enthesopathy, unspecified: Secondary | ICD-10-CM

## 2016-03-21 DIAGNOSIS — E11621 Type 2 diabetes mellitus with foot ulcer: Secondary | ICD-10-CM

## 2016-03-21 DIAGNOSIS — L97519 Non-pressure chronic ulcer of other part of right foot with unspecified severity: Secondary | ICD-10-CM

## 2016-03-21 MED ORDER — NONFORMULARY OR COMPOUNDED ITEM: 3 refills | Status: DC

## 2016-03-21 NOTE — Progress Notes (Signed)
   Subjective:    Patient ID: Alexander Booth, male    DOB: May 02, 1946, 70 y.o.   MRN: [id number]  HPI: He presents today as a diabetic with a chief complaint of a painful lesion plantar aspect of the fifth metatarsal head of the right foot. States it has been present for quite some time. He has numbness and tingling in his feet that hurt and bother him all the time secondary to his diabetes. He also relates trauma to the right foot with a compression injury.    Review of Systems  Constitutional: Positive for diaphoresis and fatigue.  HENT: Positive for hearing loss, sinus pressure and sore throat.   Respiratory: Positive for apnea and cough.   Musculoskeletal: Positive for arthralgias and myalgias.  Neurological: Positive for dizziness and headaches.  Psychiatric/Behavioral: Positive for confusion. The patient is nervous/anxious.   All other systems reviewed and are negative.      Objective:   Physical Exam:: Vital signs are stable he is alert and oriented 3 pulses are palpable he has some discrepancy between sharp and dull sensation decreased sensorium per Semmes-Weinstein monofilament. Degenerative flexor intact muscle strength is normal bilateral. Orthopedic evaluation demonstrates DISTAL to the ankle for range of motion without crepitation. Mild hammertoe deformity G Reform is noted bilateral reactive hyperkeratosis of fifth metatarsal right foot. Once debrided demonstrates no erythema cellulitis drainage or odor. I did numb this area today with local anesthetic to debrided. I see no signs of infection. 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 Flattening of the foot.        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  Plan: Debrided the area today placed padding  and initiated paperwork for diabetic shoes.

## 2016-04-03 ENCOUNTER — Other Ambulatory Visit: Payer: Self-pay | Admitting: Internal Medicine

## 2016-04-03 DIAGNOSIS — F3340 Major depressive disorder, recurrent, in remission, unspecified: Secondary | ICD-10-CM | POA: Diagnosis not present

## 2016-04-04 ENCOUNTER — Ambulatory Visit (INDEPENDENT_AMBULATORY_CARE_PROVIDER_SITE_OTHER): Payer: Commercial Managed Care - HMO | Admitting: Podiatry

## 2016-04-04 ENCOUNTER — Encounter: Payer: Self-pay | Admitting: Podiatry

## 2016-04-04 DIAGNOSIS — L859 Epidermal thickening, unspecified: Secondary | ICD-10-CM | POA: Diagnosis not present

## 2016-04-04 DIAGNOSIS — M779 Enthesopathy, unspecified: Secondary | ICD-10-CM

## 2016-04-04 DIAGNOSIS — Q828 Other specified congenital malformations of skin: Secondary | ICD-10-CM

## 2016-04-04 DIAGNOSIS — L858 Other specified epidermal thickening: Secondary | ICD-10-CM | POA: Diagnosis not present

## 2016-04-04 DIAGNOSIS — L988 Other specified disorders of the skin and subcutaneous tissue: Secondary | ICD-10-CM | POA: Diagnosis not present

## 2016-04-04 NOTE — Patient Instructions (Signed)
Surgery-Directions for Home Care  You will need: Dial antibacterial hand soap, sterile gauze,  Band-aids  1. Keep the original bandage on until the following morning.  Bathe or shower with the bandage on allowing it to soak, so that when removed it won't stick to the wound. 2. After showering or bathing, remove the old bandage and cleanse the area with Dial soap and water.  Place a few drops of Dial soap and water on a piece of guaze and gently scrub the area.  Dry with a clean piece of gauze. 3. Apply antibiotic cream (polysporin, triple antibiotic or similar) to the area and place a clean square gauze bandage over and cover with a band-aid. 4. In the evening, add a few drops of Dial soap to a basin of lukewarm water and soak your foot for 15 minutes.  After soaking, follow the instructions above for cleaning the area. 5. Continue cleansing the area as described above two times a day, applying sterile gauze dressings until the doctor informs you that it is not needed. 6. The charge for the surgical procedure includes the follow-up visits after surgery.  Additional treatments (if necessary) are not included. 7. The time required to heal the surgical site will depend upon the size and location of the wart.  Lesions under bony prominences heal slower.  The average healing time is 2 to 4 weeks. 8. Take over the counter Ibuprofen or Tylenol as needed should you experience any discomfort 9. If you do experience discomfort after surgery, keep the foot elevated and apply an ice pack over your ankle, 30 minutes on, 30 minutes off each hour for the rest of the day. 10. If you have any questions , please do not hesitate to contact the office. 

## 2016-04-05 ENCOUNTER — Other Ambulatory Visit: Payer: Self-pay | Admitting: Internal Medicine

## 2016-04-05 DIAGNOSIS — F0391 Unspecified dementia with behavioral disturbance: Secondary | ICD-10-CM

## 2016-04-05 DIAGNOSIS — G4700 Insomnia, unspecified: Secondary | ICD-10-CM

## 2016-04-05 DIAGNOSIS — F03918 Unspecified dementia, unspecified severity, with other behavioral disturbance: Secondary | ICD-10-CM

## 2016-04-05 MED ORDER — MEMANTINE HCL ER 7 MG PO CP24: 7.0000 mg | ORAL_CAPSULE | Freq: Every morning | ORAL | 1 refills | Status: DC

## 2016-04-05 MED ORDER — METFORMIN HCL 500 MG PO TABS: 500.0000 mg | ORAL_TABLET | Freq: Two times a day (BID) | ORAL | 1 refills | Status: DC

## 2016-04-05 MED ORDER — LORAZEPAM 0.5 MG PO TABS
0.5000 mg | ORAL_TABLET | Freq: Every day | ORAL | 0 refills | Status: DC | PRN
Start: 2016-04-05 — End: 2016-05-16

## 2016-04-05 NOTE — Telephone Encounter (Signed)
Alexander Booth called to request a refill of the Lorazepam and also to see if Namenda and Remeron can be filled through [facility] instead of the [facility].  She can be reached at [phone]

## 2016-04-05 NOTE — Addendum Note (Signed)
Addended by: Lurlean Nanny on: 04/05/2016 04:39 PM   Modules accepted: Orders

## 2016-04-05 NOTE — Telephone Encounter (Signed)
Rx faxed to mail order 

## 2016-04-05 NOTE — Telephone Encounter (Signed)
Ok to phone in [place]. Ok to send Remeron and Namenda to mail order

## 2016-04-06 NOTE — Progress Notes (Signed)
He presents today still complaining of a painful lesion sub-fifth metatarsal head of the right foot. He states that it did improve somewhat but is still very painful. He states that his feet are burning and feel like they are on fire all the time because of the diabetic peripheral neuropathy. He states that his diabetes and that his A1c is doing very well.  Objective: Vital signs are stable he is alert and oriented 3. Pulses are palpable. Porokeratotic lesion sub-fifth metatarsal head of the right foot with its moderate to severe fat pad atrophy. Our only other option here is to curettage the wound.  Assessment: Diabetes mellitus with porokeratotic lesion  Plan: Surgical curettage was performed today after local anesthesia was injected sublesionally. He tolerated this procedure well and the lesion was sent for pathologic evaluation. 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 He was provided with both oral and written home-going instructions for care and soaking of his foot and I will follow-up with him in 1 week.

## 2016-04-11 ENCOUNTER — Ambulatory Visit (INDEPENDENT_AMBULATORY_CARE_PROVIDER_SITE_OTHER): Payer: Commercial Managed Care - HMO | Admitting: Podiatry

## 2016-04-11 ENCOUNTER — Encounter: Payer: Self-pay | Admitting: Podiatry

## 2016-04-11 VITALS — BP 118/67 | HR 74 | Resp 16

## 2016-04-11 DIAGNOSIS — E1142 Type 2 diabetes mellitus with diabetic polyneuropathy: Secondary | ICD-10-CM

## 2016-04-11 DIAGNOSIS — E11621 Type 2 diabetes mellitus with foot ulcer: Secondary | ICD-10-CM

## 2016-04-11 DIAGNOSIS — L97519 Non-pressure chronic ulcer of other part of right foot with unspecified severity: Secondary | ICD-10-CM | POA: Diagnosis not present

## 2016-04-11 DIAGNOSIS — Z9889 Other specified postprocedural states: Secondary | ICD-10-CM

## 2016-04-11 MED ORDER — CEPHALEXIN 500 MG PO CAPS: 500.0000 mg | ORAL_CAPSULE | Freq: Three times a day (TID) | ORAL | 0 refills | Status: DC

## 2016-04-11 MED ORDER — OXYCODONE-ACETAMINOPHEN 10-325 MG PO TABS: 1.0000 | ORAL_TABLET | ORAL | 0 refills | Status: DC | PRN

## 2016-04-11 NOTE — Progress Notes (Signed)
He presents today for follow-up of a excision of a lesion from the right plantar forefoot. He denies fever chills nausea and vomiting. He states that the pain has been severe to the right foot he's not complaining about the area that was surgically excised but also the medial aspect of the IP joint of the hallux right. He has a history of severe back pain and neuropathy associated with very high blood sugars.  Objective: Vital signs are stable he's alert and oriented 3. Pulses are strongly palpable. Neurologic sensorium is severe along the forefoot right. Particularly beneath the area of surgical excision. However this has gone on to heal uneventfully no signs of infection. No open wound here.  Assessment severe diabetic peripheral neuropathy well-healing wound fifth metatarsal plantar.  Plan: I placed him on antibiotic Keflex 500 mg 1 by mouth 3 times a day just in case there is a wound that I cannot see. I also placed him on Percocet to help break his pain cycle. I recommended him and his family that he should be treated by pain clinic. I he will follow up with Dr. Amalia Hailey in 1 week just to make sure that he is doing well. We will consider diabetic shoes once his wound has healed 100%.

## 2016-04-12 NOTE — Telephone Encounter (Addendum)
Alexander Booth (DPR signed) left v/m; Alexander Booth had spoken with Threasa Beards about Ativan refill; pt received med from mail order and Ativan was not in the shipment. Alexander Booth request cb with status of Ativan refill.

## 2016-04-12 NOTE — Telephone Encounter (Signed)
This Probation officer noted that R/X had been sent in by Lindalou Hose, CMA  to mail order pharmacy for the Lorazepam; however, patient still hadn't received it.  I contacted mail order company and verified they did receive and accept script for #20, 0 refills for patient and it was mailed out on 04/10/16.    I notified [name] by phone of information and to expect delivery within the next 3 days.

## 2016-04-13 ENCOUNTER — Other Ambulatory Visit: Payer: Self-pay | Admitting: Internal Medicine

## 2016-04-17 MED ORDER — MEMANTINE HCL ER 7 MG PO CP24: 7.0000 mg | ORAL_CAPSULE | Freq: Every morning | ORAL | 1 refills | Status: DC

## 2016-04-17 NOTE — Telephone Encounter (Signed)
Alexander Booth has not gotten ativan from mail order pharmacy. Alexander Booth will call  Mail order to see if they can track the rx. Also teresa said received the namenda from [facility] and cost to pt was $500.00 +. [name] sent med back to [place] and advised [facility] to disgard that rx. Alexander Booth request rx sent to [place]. Advised done.

## 2016-04-17 NOTE — Addendum Note (Signed)
Addended by: Helene Shoe on: 04/17/2016 03:56 PM   Modules accepted: Orders

## 2016-04-18 ENCOUNTER — Ambulatory Visit (INDEPENDENT_AMBULATORY_CARE_PROVIDER_SITE_OTHER): Payer: Commercial Managed Care - HMO

## 2016-04-18 ENCOUNTER — Ambulatory Visit (INDEPENDENT_AMBULATORY_CARE_PROVIDER_SITE_OTHER): Payer: Commercial Managed Care - HMO | Admitting: Podiatry

## 2016-04-18 DIAGNOSIS — R609 Edema, unspecified: Secondary | ICD-10-CM | POA: Diagnosis not present

## 2016-04-18 DIAGNOSIS — R600 Localized edema: Secondary | ICD-10-CM

## 2016-04-18 DIAGNOSIS — Q828 Other specified congenital malformations of skin: Secondary | ICD-10-CM | POA: Diagnosis not present

## 2016-04-18 DIAGNOSIS — M79674 Pain in right toe(s): Secondary | ICD-10-CM

## 2016-04-18 DIAGNOSIS — S99921A Unspecified injury of right foot, initial encounter: Secondary | ICD-10-CM

## 2016-04-18 DIAGNOSIS — L84 Corns and callosities: Secondary | ICD-10-CM
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
## 2016-04-23 ENCOUNTER — Telehealth: Payer: Self-pay

## 2016-04-23 NOTE — Telephone Encounter (Signed)
Alexander Booth left v/m; pt is in [place] and cost to pt per month for namenda is $160.00. [name] wants to know if can suggest something so med would not be as expensive.

## 2016-04-23 NOTE — Telephone Encounter (Signed)
She needs to call the neurologist, they ordered this

## 2016-04-24 ENCOUNTER — Ambulatory Visit (INDEPENDENT_AMBULATORY_CARE_PROVIDER_SITE_OTHER): Payer: Commercial Managed Care - HMO | Admitting: Podiatry

## 2016-04-24 DIAGNOSIS — M779 Enthesopathy, unspecified: Secondary | ICD-10-CM

## 2016-04-24 DIAGNOSIS — M79674 Pain in right toe(s): Secondary | ICD-10-CM

## 2016-04-24 DIAGNOSIS — E1142 Type 2 diabetes mellitus with diabetic polyneuropathy: Secondary | ICD-10-CM

## 2016-04-24 DIAGNOSIS — L84 Corns and callosities: Secondary | ICD-10-CM

## 2016-04-24 MED ORDER — NONFORMULARY OR COMPOUNDED ITEM: 1.0000 g | Freq: Four times a day (QID) | 2 refills | Status: DC

## 2016-04-24 NOTE — Telephone Encounter (Signed)
Pt's daughter states that she remembered you saying that you were taking over filling all of pt's medication.Alexander KitchenMarland KitchenPt has not seen Dr Rexene Alberts since 09/2015---please advise

## 2016-04-25 NOTE — Telephone Encounter (Signed)
I did not tell her that. His dementia, psych meds either need to come from the neurologist or the neuropsychiatrist.

## 2016-04-27 MED ORDER — BETAMETHASONE SOD PHOS & ACET 6 (3-3) MG/ML IJ SUSP: 12.0000 mg | Freq: Once | INTRAMUSCULAR | Status: DC
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
## 2016-05-01 ENCOUNTER — Other Ambulatory Visit: Payer: Self-pay | Admitting: Internal Medicine

## 2016-05-02 ENCOUNTER — Ambulatory Visit: Payer: Commercial Managed Care - HMO | Admitting: Podiatry

## 2016-05-10 DIAGNOSIS — M19012 Primary osteoarthritis, left shoulder: Secondary | ICD-10-CM | POA: Diagnosis not present

## 2016-05-16 ENCOUNTER — Telehealth: Payer: Self-pay

## 2016-05-16 NOTE — Telephone Encounter (Signed)
Alexander Booth (DPR signed)left v/m requesting refill ativan to [facility]. Last printed # 20 x 0 on 04/05/16. Last seen dementia on 01/17/16. Alexander Booth also noted pt not resting at night and request med to help pt sleep. Alexander Booth request cb.

## 2016-05-17 MED ORDER — LORAZEPAM 0.5 MG PO TABS
0.5000 mg | ORAL_TABLET | Freq: Every day | ORAL | 0 refills | Status: DC | PRN
Start: 2016-05-17 — End: 2016-06-21

## 2016-05-17 NOTE — Telephone Encounter (Signed)
Rx faxed to mail order pharmacy. 

## 2016-05-17 NOTE — Telephone Encounter (Signed)
RX printed and signed and placed in MYD box. She needs to call the neuropsychiatrist to request med to help sleep.

## 2016-05-21 DIAGNOSIS — I10 Essential (primary) hypertension: Secondary | ICD-10-CM | POA: Diagnosis not present

## 2016-05-21 DIAGNOSIS — M546 Pain in thoracic spine: Secondary | ICD-10-CM | POA: Diagnosis not present

## 2016-05-21 DIAGNOSIS — M545 Low back pain: Secondary | ICD-10-CM | POA: Diagnosis not present

## 2016-05-21 DIAGNOSIS — M542 Cervicalgia: Secondary | ICD-10-CM | POA: Diagnosis not present

## 2016-05-22 ENCOUNTER — Ambulatory Visit (INDEPENDENT_AMBULATORY_CARE_PROVIDER_SITE_OTHER): Payer: Commercial Managed Care - HMO | Admitting: Podiatry

## 2016-05-22 ENCOUNTER — Encounter: Payer: Self-pay | Admitting: Podiatry

## 2016-05-22 VITALS — BP 136/64 | HR 69 | Resp 16

## 2016-05-22 DIAGNOSIS — M79671 Pain in right foot: Secondary | ICD-10-CM | POA: Diagnosis not present

## 2016-05-22 DIAGNOSIS — Q828 Other specified congenital malformations of skin: Secondary | ICD-10-CM | POA: Diagnosis not present

## 2016-05-22 DIAGNOSIS — M898X9 Other specified disorders of bone, unspecified site: Secondary | ICD-10-CM | POA: Diagnosis not present

## 2016-05-22 DIAGNOSIS — M2011 Hallux valgus (acquired), right foot: Secondary | ICD-10-CM

## 2016-05-22 DIAGNOSIS — M21621 Bunionette of right foot: Secondary | ICD-10-CM | POA: Diagnosis not present

## 2016-05-22 DIAGNOSIS — M205X1 Other deformities of toe(s) (acquired), right foot: Secondary | ICD-10-CM | POA: Diagnosis not present

## 2016-05-22 NOTE — Telephone Encounter (Signed)
[name] left v/m requesting cb with status of ativan refill.

## 2016-05-22 NOTE — Telephone Encounter (Signed)
Spoke to daughter [name] and advise her Rx was faxed Fri
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
## 2016-06-06 ENCOUNTER — Telehealth: Payer: Self-pay | Admitting: *Deleted

## 2016-06-06 ENCOUNTER — Encounter: Payer: Self-pay | Admitting: Podiatry

## 2016-06-06 DIAGNOSIS — M2041 Other hammer toe(s) (acquired), right foot: Secondary | ICD-10-CM | POA: Diagnosis not present

## 2016-06-06 DIAGNOSIS — M2011 Hallux valgus (acquired), right foot: Secondary | ICD-10-CM | POA: Diagnosis not present

## 2016-06-06 DIAGNOSIS — M25571 Pain in right ankle and joints of right foot: Secondary | ICD-10-CM | POA: Diagnosis not present

## 2016-06-06 DIAGNOSIS — E7800 Pure hypercholesterolemia, unspecified: Secondary | ICD-10-CM | POA: Diagnosis not present

## 2016-06-06 DIAGNOSIS — M21621 Bunionette of right foot: Secondary | ICD-10-CM | POA: Diagnosis not present

## 2016-06-06 DIAGNOSIS — M25774 Osteophyte, right foot: Secondary | ICD-10-CM | POA: Diagnosis not present

## 2016-06-06 DIAGNOSIS — M21611 Bunion of right foot: Secondary | ICD-10-CM | POA: Diagnosis not present

## 2016-06-06 DIAGNOSIS — Q6622 Congenital metatarsus adductus: Secondary | ICD-10-CM | POA: Diagnosis not present

## 2016-06-06 DIAGNOSIS — D1631 Benign neoplasm of short bones of right lower limb: Secondary | ICD-10-CM | POA: Diagnosis not present

## 2016-06-06 NOTE — Telephone Encounter (Addendum)
Alexander [facility]one 7.5mg  #120 on 05/21/2016 and Dr. Amalia Hailey has prescribed the Hydrocodone 10/325mg  and Meloxicam 15mg  without instructions. I spoke with Dr. Amalia Hailey and he states stop all Hydrocodone except the Hydrocodone 10/325mg  prescribed to day. I told Alexander Booth and told her to have pt take the Meloxicam 15 mg 1 everyday. [name] -pt's dtr asked the same questions and I informed her of Dr. Amalia Hailey instructions. 06/07/2016-Pt's dtr, [name] asked for crutches for pt. I spoke with [name], and told her I felt for pt's age and moblility concerns he would benefit from the use of a walker. [name] agreed and states she has a walker and will help him get use to it. 06/07/2016-Received refill request for Duexis from [facility]. I left message informing pt's caregiver/dtr [name] not to give pt the Duexix and Meloxicam both were antiinflammatories, and to call me to confirm she had received the message. Return fax denied refill Deuxis. Pt called and thanked Dr. Amalia Hailey for calling and said he is doing good.

## 2016-06-10 NOTE — Progress Notes (Signed)
DOS 11.16.2017 Bunionectomy with Osteotomy Right, Tailors Bunionectomy Right, Hammertoe Correction Digits 4 & 5 Right, Exostectomy 5th Metatarsal Right, Exostectomy Cuboid Right Foot

## 2016-06-12 ENCOUNTER — Encounter: Payer: Self-pay | Admitting: Podiatry

## 2016-06-12 ENCOUNTER — Ambulatory Visit (INDEPENDENT_AMBULATORY_CARE_PROVIDER_SITE_OTHER): Payer: Commercial Managed Care - HMO

## 2016-06-12 ENCOUNTER — Ambulatory Visit (INDEPENDENT_AMBULATORY_CARE_PROVIDER_SITE_OTHER): Payer: Commercial Managed Care - HMO | Admitting: Podiatry

## 2016-06-12 VITALS — BP 130/69 | HR 79 | Resp 16

## 2016-06-12 DIAGNOSIS — M21621 Bunionette of right foot: Secondary | ICD-10-CM

## 2016-06-12 DIAGNOSIS — M2041 Other hammer toe(s) (acquired), right foot: Secondary | ICD-10-CM

## 2016-06-12 DIAGNOSIS — M2011 Hallux valgus (acquired), right foot: Secondary | ICD-10-CM | POA: Diagnosis not present

## 2016-06-12 DIAGNOSIS — Z9889 Other specified postprocedural states: Secondary | ICD-10-CM | POA: Diagnosis not present

## 2016-06-12 MED ORDER — SULFAMETHOXAZOLE-TRIMETHOPRIM 800-160 MG PO TABS: 1.0000 | ORAL_TABLET | Freq: Two times a day (BID) | ORAL | 0 refills | Status: DC

## 2016-06-21 ENCOUNTER — Other Ambulatory Visit: Payer: Self-pay

## 2016-06-21 MED ORDER — LORAZEPAM 0.5 MG PO TABS: 0.5000 mg | ORAL_TABLET | Freq: Every day | ORAL | 0 refills | Status: DC | PRN

## 2016-06-21 NOTE — Telephone Encounter (Signed)
rx faxed to mail order 

## 2016-06-21 NOTE — Telephone Encounter (Signed)
RX printed and signed and placed in MYD box 

## 2016-06-21 NOTE — Telephone Encounter (Signed)
Alexander Booth (DPR signed) left v/m requesting refill ativan to [facility]. Last refilled # 20 on 05/17/16. Last seen for f/u 01/17/16.

## 2016-06-24 ENCOUNTER — Ambulatory Visit: Payer: Commercial Managed Care - HMO

## 2016-06-24 ENCOUNTER — Ambulatory Visit (INDEPENDENT_AMBULATORY_CARE_PROVIDER_SITE_OTHER): Payer: Commercial Managed Care - HMO | Admitting: Podiatry

## 2016-06-24 DIAGNOSIS — Z9889 Other specified postprocedural states: Secondary | ICD-10-CM

## 2016-06-24 NOTE — Progress Notes (Signed)
Subjective: Patient presents today status post surgical reconstruction of the right foot. Patient states that he is doing very well. Date of surgery 06/06/2016. Patient is extremely satisfied and states that he does not have pain.  Objective: Skin incisions are well coapted with sutures and staples intact. No sign of infectious process. Minimal edema noted.  Assessment: Status post reconstructive surgery of the right foot. Date of surgery 06/06/2016. Next  Plan of care: Today the patient was evaluated. Sutures and staples were removed today. Patient was transitioned to a postoperative shoe. Postoperative shoe dispensed today. Patient can shower in 3 days. Patient can transition into normal shoe gear in 2 weeks.   Today the patient was fitted for diabetic shoes with Plastizote inserts with [facility].   Return to clinic in 4 weeks. X-rays next visit

## 2016-06-28 ENCOUNTER — Encounter: Payer: Self-pay | Admitting: Podiatry

## 2016-06-30 NOTE — Progress Notes (Signed)
Subjective: Patient presents today status post 1 week for bunionectomy and tailor's bunionectomy with repair of hammertoe and forefoot reconstruction to the right lower extremity. Patient states that he is not doing too bad. Date of surgery 06/06/2016.  Objective: Skin incisions are well coapted with staples intact. There is a minimal amount of diffuse erythema with edema noted to the right lower extremity midfoot possibly due to mild cellulitis. Percutaneous fixation pins are intact.  Assessment: Status post forefoot reconstruction right lower extremity. Date of surgery 06/06/2016. Possible cellulitis right lower extremity.  Plan of care: Today the patient was evaluated. Dressings were changed. Prescription for Bactrim DS 2 weeks. Return to clinic in 1 week

## 2016-07-08 ENCOUNTER — Other Ambulatory Visit: Payer: Self-pay | Admitting: Internal Medicine

## 2016-07-08 DIAGNOSIS — R609 Edema, unspecified: Secondary | ICD-10-CM

## 2016-07-08 MED ORDER — LORAZEPAM 0.5 MG PO TABS: 0.5000 mg | ORAL_TABLET | Freq: Every day | ORAL | 0 refills | Status: DC | PRN

## 2016-07-08 NOTE — Addendum Note (Signed)
Addended by: Helene Shoe on: 07/08/2016 05:19 PM   Modules accepted: Orders

## 2016-07-08 NOTE — Telephone Encounter (Addendum)
pts daughter said Alexander Booth does not have lorazepam rx sent on 06/21/16. I spoke with [place] and she transferred me to [place] pharmacist who said do not have fax and Medication phoned to [facility] at [place] as instructed. Chrissie said will take 7-10 days for delivery but pts daughter said to send thru [place]. Done.

## 2016-07-09 DIAGNOSIS — H26491 Other secondary cataract, right eye: Secondary | ICD-10-CM | POA: Diagnosis not present

## 2016-07-09 DIAGNOSIS — H35372 Puckering of macula, left eye: Secondary | ICD-10-CM | POA: Diagnosis not present

## 2016-07-09 DIAGNOSIS — H02831 Dermatochalasis of right upper eyelid: Secondary | ICD-10-CM | POA: Diagnosis not present

## 2016-07-09 DIAGNOSIS — Z961 Presence of intraocular lens: Secondary | ICD-10-CM | POA: Diagnosis not present

## 2016-07-09 DIAGNOSIS — H02834 Dermatochalasis of left upper eyelid: Secondary | ICD-10-CM | POA: Diagnosis not present

## 2016-07-09 DIAGNOSIS — E119 Type 2 diabetes mellitus without complications: Secondary | ICD-10-CM | POA: Diagnosis not present

## 2016-07-09 DIAGNOSIS — H43811 Vitreous degeneration, right eye: Secondary | ICD-10-CM | POA: Diagnosis not present

## 2016-07-09 DIAGNOSIS — H04123 Dry eye syndrome of bilateral lacrimal glands: Secondary | ICD-10-CM | POA: Diagnosis not present

## 2016-07-09 LAB — HM DIABETES EYE EXAM

## 2016-07-24 ENCOUNTER — Ambulatory Visit (INDEPENDENT_AMBULATORY_CARE_PROVIDER_SITE_OTHER): Payer: Commercial Managed Care - HMO

## 2016-07-24 ENCOUNTER — Ambulatory Visit (INDEPENDENT_AMBULATORY_CARE_PROVIDER_SITE_OTHER): Payer: Commercial Managed Care - HMO | Admitting: Podiatry

## 2016-07-24 DIAGNOSIS — Z9889 Other specified postprocedural states: Secondary | ICD-10-CM | POA: Diagnosis not present

## 2016-07-24 DIAGNOSIS — M2011 Hallux valgus (acquired), right foot: Secondary | ICD-10-CM | POA: Diagnosis not present

## 2016-07-24 DIAGNOSIS — R600 Localized edema: Secondary | ICD-10-CM | POA: Diagnosis not present

## 2016-07-29 ENCOUNTER — Ambulatory Visit: Payer: Commercial Managed Care - HMO

## 2016-07-29 NOTE — Progress Notes (Signed)
Subjective: Patient presents today status post reconstructive surgery of the right foot. Date of surgery 06/06/2016. Patient states that he is having pain and tenderness to the first MPJ of the right foot. Patient does notice that the calluses gone from the underlying fifth MPJ which was very symptomatic.  Objective: Skin incisions well coapted. No sign of dehiscence noted. No sign of infectious process. Minimal edema noted. Erythema and warmth noted to the first MPJ surgical site likely due to irritation and rubbing.  Assessment: Status post reconstructive surgery of the right foot. Date of surgery 06/06/2016.  Plan of care: #1 patient was evaluated. X-rays were reviewed which were consistent with a delayed union of the first metatarsal osteotomy site. #2 today were going to have the patient go back into a postoperative shoe 4 weeks #3 compression anklet dispensed #4 return to clinic in 4 weeks

## 2016-08-05 ENCOUNTER — Other Ambulatory Visit: Payer: Self-pay | Admitting: Neurology

## 2016-08-05 ENCOUNTER — Other Ambulatory Visit: Payer: Self-pay | Admitting: *Deleted

## 2016-08-05 NOTE — Telephone Encounter (Signed)
Patient's daughter called requesting a refill on Depokote. [name] stated that this was prescribed by another doctor previously and they need a 90 day supply . Pharmacy/ [facility]

## 2016-08-05 NOTE — Telephone Encounter (Signed)
This should be filled by Dr. Rexene Alberts

## 2016-08-06 ENCOUNTER — Other Ambulatory Visit: Payer: Self-pay | Admitting: Dermatology

## 2016-08-06 DIAGNOSIS — C44529 Squamous cell carcinoma of skin of other part of trunk: Secondary | ICD-10-CM | POA: Diagnosis not present

## 2016-08-06 MED ORDER — DIVALPROEX SODIUM ER 500 MG PO TB24: 1000.0000 mg | ORAL_TABLET | Freq: Every day | ORAL | 0 refills | Status: DC

## 2016-08-06 NOTE — Addendum Note (Signed)
Addended by: Star Age on: 08/06/2016 05:56 PM   Modules accepted: Orders

## 2016-08-06 NOTE — Addendum Note (Signed)
Addended by: Lurlean Nanny on: 08/06/2016 02:22 PM   Modules accepted: Orders

## 2016-08-12 ENCOUNTER — Other Ambulatory Visit: Payer: Self-pay | Admitting: Internal Medicine

## 2016-08-12 ENCOUNTER — Other Ambulatory Visit: Payer: Self-pay

## 2016-08-12 MED ORDER — LORAZEPAM 0.5 MG PO TABS: 0.5000 mg | ORAL_TABLET | Freq: Every day | ORAL | 2 refills | Status: DC | PRN

## 2016-08-12 NOTE — Telephone Encounter (Signed)
Rx faxed

## 2016-08-12 NOTE — Telephone Encounter (Signed)
Go ahead and send in with 2 refills

## 2016-08-12 NOTE — Telephone Encounter (Signed)
Alexander Booth left v/m requesting refill lorazepam to [facility]. Last refilled # 20 on 07/08/16.last seen 01/17/16.

## 2016-08-12 NOTE — Telephone Encounter (Signed)
Placed in your box for signature  

## 2016-08-12 NOTE — Telephone Encounter (Signed)
Signed, back in MYD box

## 2016-08-13 MED ORDER — MIRTAZAPINE 45 MG PO TABS: 45.0000 mg | ORAL_TABLET | Freq: Every day | ORAL | 1 refills | Status: DC

## 2016-08-15 ENCOUNTER — Telehealth: Payer: Self-pay

## 2016-08-15 NOTE — Telephone Encounter (Signed)
Alexander Booth left v/m and wants to know if Alexander Echevaria NP would start to prescribe Depakote; last refilled # 180 on 08/06/16 by Alexander Booth. Last seen for dementia 01/17/16.No future appt scheduled. Alexander Booth request cb.

## 2016-08-15 NOTE — Telephone Encounter (Signed)
Are they not planning on following with Dr Rexene Alberts anymore?

## 2016-08-16 NOTE — Telephone Encounter (Signed)
Why did he stop taking all the meds? Is our medication list UTD? Depakote is not something I fill for psychiatric issues so I think its important he continue to see neuropsych.

## 2016-08-16 NOTE — Telephone Encounter (Signed)
[name] states he does not see nero any longer---she reports pt had to be taken all of the medication she had him on earlier last year after he had the mental issues---she reports Dr did refill one more time but she does not plan on taking pt back there--please advise

## 2016-08-20 ENCOUNTER — Other Ambulatory Visit: Payer: Self-pay | Admitting: Internal Medicine

## 2016-08-20 NOTE — Telephone Encounter (Signed)
Alexander Booth (DPR signed) left v/m; Alexander Booth got notice from [facility] that they do not have rx for ativan. Appears was faxed on 08/12/16. Melanie CMA said also faxed back Dr Deborra Medina  Is supervising physician and Threasa Beards received notice by fax that both faxes were received by [facility]. I notified Alexander Booth and she said she does not doubt that the faxes were done but she talked with [facility] pharmacy today and they do not have the ativan rx. I advised Alexander Booth we would refax rx. Melanie CMA refaxed lorazepam rx to [facility].

## 2016-08-21 NOTE — Telephone Encounter (Signed)
Spoke to [place] about the response and she states that she will take over refilling medication but in the future she will not make any adjustments or changes to meds as pt will have to return to Neurology

## 2016-08-21 NOTE — Telephone Encounter (Signed)
Rx was faxed 08/12/16 and then refaxed 08/20/2016

## 2016-08-26 ENCOUNTER — Ambulatory Visit: Payer: Commercial Managed Care - HMO | Admitting: Podiatry

## 2016-09-02 ENCOUNTER — Ambulatory Visit (INDEPENDENT_AMBULATORY_CARE_PROVIDER_SITE_OTHER): Payer: Medicare HMO

## 2016-09-02 ENCOUNTER — Ambulatory Visit (INDEPENDENT_AMBULATORY_CARE_PROVIDER_SITE_OTHER): Payer: Medicare HMO | Admitting: Podiatry

## 2016-09-02 DIAGNOSIS — M216X9 Other acquired deformities of unspecified foot: Secondary | ICD-10-CM | POA: Diagnosis not present

## 2016-09-02 DIAGNOSIS — M2041 Other hammer toe(s) (acquired), right foot: Secondary | ICD-10-CM

## 2016-09-02 DIAGNOSIS — M216X2 Other acquired deformities of left foot: Secondary | ICD-10-CM | POA: Diagnosis not present

## 2016-09-02 DIAGNOSIS — E1142 Type 2 diabetes mellitus with diabetic polyneuropathy: Secondary | ICD-10-CM | POA: Diagnosis not present

## 2016-09-02 DIAGNOSIS — L84 Corns and callosities: Secondary | ICD-10-CM

## 2016-09-02 DIAGNOSIS — M2011 Hallux valgus (acquired), right foot: Secondary | ICD-10-CM

## 2016-09-02 DIAGNOSIS — Z9889 Other specified postprocedural states: Secondary | ICD-10-CM

## 2016-09-02 NOTE — Patient Instructions (Signed)

## 2016-09-02 NOTE — Progress Notes (Signed)
Subjective: Patient presents today status post forefoot reconstructive surgery of the right foot. Date of surgery 06/06/2016. Patient states that he is still having some dorsal foot pain. He does believe he is much better today than he was previously before surgery.  Objective: Skin incisions well coapted. There is moderate edema noted secondary to the surgery. Limited range of motion of the first MPJ likely due to scar tissue.  Assessment: Status post reconstructive surgery of the right foot. Date of surgery 06/06/2016  Plan of care: Today patient was evaluated. X-rays were reviewed. Continue conservative care and wearing good shoes. Work on range of motion of the first and fifth MPJs. Return to clinic in 3 months. Today diabetic shoes with Plastizote inserts were dispensed for the patient today. Break-in instructions were provided.  Edrick Kins, DPM [facility]  Dr. Edrick Kins, [place]                                        [place],  [address]                Office [phone]  Fax [phone]

## 2016-09-02 NOTE — Progress Notes (Signed)
Patient ID: Alexander Booth, male   DOB: 09/10/1945, 71 y.o.   MRN: [id number]   Patient presents for diabetic shoe pick up, shoes are tried on for good fit.  Patient received 1 pair Apex Y910M Ariya moc toe brown in men's size 11.5 extra wide and 3 pairs custom molded diabetic inserts.  Verbal and written break in and wear instructions given.  Patient will follow up for scheduled routine care.

## 2016-09-04 ENCOUNTER — Ambulatory Visit: Payer: Medicare HMO | Admitting: Podiatry

## 2016-09-14 ENCOUNTER — Other Ambulatory Visit: Payer: Self-pay | Admitting: Internal Medicine

## 2016-09-20 ENCOUNTER — Other Ambulatory Visit: Payer: Self-pay

## 2016-09-20 MED ORDER — LORAZEPAM 0.5 MG PO TABS: 0.5000 mg | ORAL_TABLET | Freq: Every day | ORAL | 2 refills | Status: DC | PRN

## 2016-09-20 NOTE — Telephone Encounter (Signed)
Alexander Booth (DPR signed) left v/m requesting refill ativan to [facility]. Last refilled # 20 x 2 on 08/12/16.last seen 01/17/16.

## 2016-09-20 NOTE — Telephone Encounter (Signed)
I advised Alexander Booth that the Rx was sent in with 2 refills--- she stated that she did not know there were refills---she will call mail order pharmacy and see if they can send refill and let me know if she changed her mind and wants to do local pharmacy

## 2016-09-20 NOTE — Telephone Encounter (Signed)
Ok to phone in [place] to be filled 3/22 or after.

## 2016-09-24 DIAGNOSIS — L814 Other melanin hyperpigmentation: Secondary | ICD-10-CM | POA: Diagnosis not present

## 2016-09-24 DIAGNOSIS — L219 Seborrheic dermatitis, unspecified: Secondary | ICD-10-CM | POA: Diagnosis not present

## 2016-09-24 DIAGNOSIS — L821 Other seborrheic keratosis: Secondary | ICD-10-CM | POA: Diagnosis not present

## 2016-09-24 DIAGNOSIS — Z85828 Personal history of other malignant neoplasm of skin: Secondary | ICD-10-CM | POA: Diagnosis not present

## 2016-09-24 DIAGNOSIS — L570 Actinic keratosis: Secondary | ICD-10-CM | POA: Diagnosis not present

## 2016-09-27 ENCOUNTER — Telehealth: Payer: Self-pay | Admitting: *Deleted

## 2016-09-27 MED ORDER — HYDROCODONE-ACETAMINOPHEN 5-325 MG PO TABS: 1.0000 | ORAL_TABLET | Freq: Four times a day (QID) | ORAL | 0 refills | Status: DC | PRN

## 2016-09-27 NOTE — Telephone Encounter (Signed)
Pt called for pain medication. Pt's dtr, [name] states pt needs pain medication his foot is swollen and painful. Dr. Jacqualyn Posey states get pt in next week, Hydrocodone 5mg  #20 one tablet every 6 hours prn foot pain. I informed [name] of orders and she is to pick up the rx in the new [place] office. [name] states she will check schedule and call Monday to set up appt.

## 2016-10-10 DIAGNOSIS — M546 Pain in thoracic spine: Secondary | ICD-10-CM | POA: Diagnosis not present

## 2016-10-10 DIAGNOSIS — M542 Cervicalgia: Secondary | ICD-10-CM | POA: Diagnosis not present

## 2016-10-10 DIAGNOSIS — I10 Essential (primary) hypertension: Secondary | ICD-10-CM | POA: Diagnosis not present

## 2016-10-10 DIAGNOSIS — M545 Low back pain: Secondary | ICD-10-CM | POA: Diagnosis not present

## 2016-10-13 ENCOUNTER — Other Ambulatory Visit: Payer: Self-pay | Admitting: Internal Medicine

## 2016-10-14 ENCOUNTER — Other Ambulatory Visit: Payer: Self-pay | Admitting: Neurology

## 2016-10-24 ENCOUNTER — Encounter: Payer: Self-pay | Admitting: Internal Medicine

## 2016-10-24 MED ORDER — DIVALPROEX SODIUM ER 500 MG PO TB24: 1000.0000 mg | ORAL_TABLET | Freq: Every day | ORAL | 1 refills | Status: DC

## 2016-11-04 ENCOUNTER — Encounter: Payer: Self-pay | Admitting: Internal Medicine

## 2016-11-04 ENCOUNTER — Telehealth: Payer: Self-pay | Admitting: Internal Medicine

## 2016-11-04 ENCOUNTER — Ambulatory Visit (INDEPENDENT_AMBULATORY_CARE_PROVIDER_SITE_OTHER): Payer: Medicare HMO | Admitting: Internal Medicine

## 2016-11-04 VITALS — BP 128/76 | HR 78 | Temp 97.6°F | Wt 184.8 lb

## 2016-11-04 DIAGNOSIS — G479 Sleep disorder, unspecified: Secondary | ICD-10-CM | POA: Diagnosis not present

## 2016-11-04 DIAGNOSIS — R5383 Other fatigue: Secondary | ICD-10-CM | POA: Diagnosis not present

## 2016-11-04 LAB — TSH: TSH: 1.66 u[IU]/mL (ref 0.35–4.50)

## 2016-11-04 LAB — COMPREHENSIVE METABOLIC PANEL
ALT: 8 U/L (ref 0–53)
AST: 14 U/L (ref 0–37)
Albumin: 4.3 g/dL (ref 3.5–5.2)
Alkaline Phosphatase: 52 U/L (ref 39–117)
BILIRUBIN TOTAL: 0.4 mg/dL (ref 0.2–1.2)
BUN: 13 mg/dL (ref 6–23)
CALCIUM: 9.8 mg/dL (ref 8.4–10.5)
CO2: 33 mEq/L — ABNORMAL HIGH (ref 19–32)
CREATININE: 0.98 mg/dL (ref 0.40–1.50)
Chloride: 99 mEq/L (ref 96–112)
GFR: 79.67 mL/min (ref >60.00)
Glucose, Bld: 123 mg/dL — ABNORMAL HIGH (ref 70–99)
Potassium: 4.7 mEq/L (ref 3.5–5.1)
Sodium: 136 mEq/L (ref 135–145)
Total Protein: 7.1 g/dL (ref 6.0–8.3)

## 2016-11-04 LAB — VITAMIN D 25 HYDROXY (VIT D DEFICIENCY, FRACTURES): VITD: 34.64 ng/mL (ref 30.00–100.00)

## 2016-11-04 LAB — CBC
HCT: 40.3 % (ref 39.0–52.0)
Hemoglobin: 14 g/dL (ref 13.0–17.0)
MCHC: 34.6 g/dL (ref 30.0–36.0)
MCV: 96.1 fl (ref 78.0–100.0)
PLATELETS: 186 10*3/uL (ref 150.0–400.0)
RBC: 4.2 Mil/uL — AB (ref 4.22–5.81)
RDW: 12.8 % (ref 11.5–15.5)
WBC: 7.1 10*3/uL (ref 4.0–10.5)

## 2016-11-04 LAB — VITAMIN B12: VITAMIN B 12: 360 pg/mL (ref 211–911)

## 2016-11-04 NOTE — Patient Instructions (Signed)
Insomnia Insomnia is a sleep disorder that makes it difficult to fall asleep or to stay asleep. Insomnia can cause tiredness (fatigue), low energy, difficulty concentrating, mood swings, and poor performance at work or school. There are three different ways to classify insomnia:  Difficulty falling asleep.  Difficulty staying asleep.  Waking up too early in the morning. Any type of insomnia can be long-term (chronic) or short-term (acute). Both are common. Short-term insomnia usually lasts for three months or less. Chronic insomnia occurs at least three times a week for longer than three months. What are the causes? Insomnia may be caused by another condition, situation, or substance, such as:  Anxiety.  Certain medicines.  Gastroesophageal reflux disease (GERD) or other gastrointestinal conditions.  Asthma or other breathing conditions.  Restless legs syndrome, sleep apnea, or other sleep disorders.  Chronic pain.  Menopause. This may include hot flashes.  Stroke.  Abuse of alcohol, tobacco, or illegal drugs.  Depression.  Caffeine.  Neurological disorders, such as Alzheimer disease.  An overactive thyroid (hyperthyroidism). The cause of insomnia may not be known. What increases the risk? Risk factors for insomnia include:  Gender. Women are more commonly affected than men.  Age. Insomnia is more common as you get older.  Stress. This may involve your professional or personal life.  Income. Insomnia is more common in people with lower income.  Lack of exercise.  Irregular work schedule or night shifts.  Traveling between different time zones. What are the signs or symptoms? If you have insomnia, trouble falling asleep or trouble staying asleep is the main symptom. This may lead to other symptoms, such as:  Feeling fatigued.  Feeling nervous about going to sleep.  Not feeling rested in the morning.  Having trouble concentrating.  Feeling irritable,  anxious, or depressed. How is this treated? Treatment for insomnia depends on the cause. If your insomnia is caused by an underlying condition, treatment will focus on addressing the condition. Treatment may also include:  Medicines to help you sleep.  Counseling or therapy.  Lifestyle adjustments. Follow these instructions at home:  Take medicines only as directed by your health care provider.  Keep regular sleeping and waking hours. Avoid naps.  Keep a sleep diary to help you and your health care provider figure out what could be causing your insomnia. Include:  When you sleep.  When you wake up during the night.  How well you sleep.  How rested you feel the next day.  Any side effects of medicines you are taking.  What you eat and drink.  Make your bedroom a comfortable place where it is easy to fall asleep:  Put up shades or special blackout curtains to block light from outside.  Use a white noise machine to block noise.  Keep the temperature cool.  Exercise regularly as directed by your health care provider. Avoid exercising right before bedtime.  Use relaxation techniques to manage stress. Ask your health care provider to suggest some techniques that may work well for you. These may include:  Breathing exercises.  Routines to release muscle tension.  Visualizing peaceful scenes.  Cut back on alcohol, caffeinated beverages, and cigarettes, especially close to bedtime. These can disrupt your sleep.  Do not overeat or eat spicy foods right before bedtime. This can lead to digestive discomfort that can make it hard for you to sleep.  Limit screen use before bedtime. This includes:  Watching TV.  Using your smartphone, tablet, and computer.  Stick to a   routine. This can help you fall asleep faster. Try to do a quiet activity, brush your teeth, and go to bed at the same time each night.  Get out of bed if you are still awake after 15 minutes of trying to  sleep. Keep the lights down, but try reading or doing a quiet activity. When you feel sleepy, go back to bed.  Make sure that you drive carefully. Avoid driving if you feel very sleepy.  Keep all follow-up appointments as directed by your health care provider. This is important. Contact a health care provider if:  You are tired throughout the day or have trouble in your daily routine due to sleepiness.  You continue to have sleep problems or your sleep problems get worse. Get help right away if:  You have serious thoughts about hurting yourself or someone else. This information is not intended to replace advice given to you by your health care provider. Make sure you discuss any questions you have with your health care provider. Document Released: 07/05/2000 Document Revised: 12/08/2015 Document Reviewed: 04/08/2014 Elsevier Interactive Patient Education  2017 Elsevier Inc.  

## 2016-11-04 NOTE — Progress Notes (Signed)
Subjective:    Patient ID: Alexander Booth, male    DOB: 1945-10-18, 71 y.o.   MRN: [id number]  HPI  Pt presents to the clinic today with c/o fatigue. He reports this has been going on for months. He reports he is not sleeping well at night. He is able to fall asleep but then wakes up 3-4 hours later and can not go back to sleep. He is tired during the day. He does not nap during the day. His appetite is fine. He denies any anxiety or increased depression. He reports he is taking a lot of medications but they are not helping. He has been on Trazadone in the past, but it was not very effective.   Review of Systems  Past Medical History:  Diagnosis Date  . Anginal pain ([place])   . Bronchitis    Subacute and hx of ongoing cigarette smoking one half pack daily  . Dementia    He has a hx of the   . Depression   . Diabetes mellitus    He's also had a hx of high cholesterol   . Dyslipidemia    He also has a past hx of   . Erectile dysfunction    He's had a hx of   . FHx: heart disease    Positive for   . Fracture of lumbar vertebra ([place]) 11/25/2012  . Hard of hearing    very little hearing on left side;better in right ear  . Hypercholesterolemia   . Hypertension    Essential  . Lumbosacral disc disease    with chronic low back pain  . Numbness and tingling in hands    and feet  . Peripheral neuropathy   . Peripheral neuropathy    He also has   . Presenile dementia   . Type 2 diabetes mellitus ([place]) 2010   takes oral meds    Current Outpatient Prescriptions  Medication Sig Dispense Refill  . aspirin 81 MG chewable tablet Chew by mouth daily.    . bisacodyl (DULCOLAX) 5 MG EC tablet Take by mouth.    . Blood Glucose Monitoring Suppl (ACCU-CHEK NANO SMARTVIEW) W/DEVICE KIT CHECK BLOOD SUGAR ONCE DAILY AND AS DIRECTED. 1 kit 0  . divalproex (DEPAKOTE ER) 500 MG 24 hr tablet Take 2 tablets (1,000 mg total) by mouth daily. 180 tablet 1  . donepezil (ARICEPT) 10 MG tablet Take 1  tablet (10 mg total) by mouth every morning. 90 tablet 3  . fluticasone (FLOVENT HFA) 110 MCG/ACT inhaler Inhale 1 puff into the lungs 2 (two) times daily. 1 Inhaler 12  . gabapentin (NEURONTIN) 300 MG capsule TAKE 1 CAPSULE TWICE DAILY 180 capsule 3  . gabapentin (NEURONTIN) 400 MG capsule TAKE 3 CAPSULES AT BEDTIME  AND  MAY  REPEAT DOSE  ONE TIME IF NEEDED 360 capsule 0  . hydrochlorothiazide (HYDRODIURIL) 25 MG tablet TAKE 2 TABLETS (50 MG TOTAL)  DAILY. 180 tablet 1  . HYDROcodone-acetaminophen (NORCO) 10-325 MG tablet Take 1 tablet by mouth every 6 (six) hours as needed.    Marland Kitchen HYDROcodone-acetaminophen (NORCO) 7.5-325 MG per tablet Take 1 tablet by mouth every 6 (six) hours as needed for moderate pain. 30 tablet 0  . HYDROcodone-acetaminophen (NORCO/VICODIN) 5-325 MG tablet Take 1 tablet by mouth every 6 (six) hours as needed for moderate pain. 20 tablet 0  . LORazepam (ATIVAN) 0.5 MG tablet Take 1 tablet (0.5 mg total) by mouth daily as needed for anxiety. 20 tablet 2  .  meloxicam (MOBIC) 15 MG tablet Take 15 mg by mouth daily.    . memantine (NAMENDA XR) 7 MG CP24 24 hr capsule Take 1 capsule (7 mg total) by mouth every morning. 90 capsule 1  . metFORMIN (GLUCOPHAGE) 500 MG tablet TAKE 1 TABLET TWICE DAILY 180 tablet 1  . mirtazapine (REMERON) 45 MG tablet Take 1 tablet (45 mg total) by mouth at bedtime. 90 tablet 1  . nitroGLYCERIN (NITROSTAT) 0.4 MG SL tablet Place under the tongue.    . NONFORMULARY OR COMPOUNDED ITEM [facility]:  Peripheral Neuropathy cream - Bupivacaine 1%, Doxepin 3%, Gabapentin 6%, Pentoxifylline 3%, Topiramate 1%, apply to affected area 1-2 grams 3-4 times daily 120 each 3  . NONFORMULARY OR COMPOUNDED ITEM Apply 1-2 g topically 4 (four) times daily. 120 each 2  . oxyCODONE-acetaminophen (PERCOCET) 10-325 MG tablet Take 1 tablet by mouth every 4 (four) hours as needed for pain. 30 tablet 0  . simvastatin (ZOCOR) 40 MG tablet Take 1 tablet (40 mg total) by mouth  daily at 6 PM. (Patient taking differently: Take 40 mg by mouth every other day. ) 30 tablet 6  . sulfamethoxazole-trimethoprim (BACTRIM DS,SEPTRA DS) 800-160 MG tablet Take 1 tablet by mouth 2 (two) times daily. 28 tablet 0  . tiZANidine (ZANAFLEX) 4 MG tablet Take 4 mg by mouth every 6 (six) hours as needed for muscle spasms.     . VENTOLIN HFA 108 (90 Base) MCG/ACT inhaler INHALE 2 PUFFS INTO THE LUNGS EVERY 6 (SIX) HOURS AS NEEDED FOR WHEEZING OR SHORTNESS OF BREATH. 18 Inhaler 0   Current Facility-Administered Medications  Medication Dose Route Frequency Provider Last Rate Last Dose  . betamethasone acetate-betamethasone sodium phosphate (CELESTONE) injection 12 mg  12 mg Intramuscular Once Edrick Kins, DPM        Allergies  Allergen Reactions  . Codeine Hives and Itching    Family History  Problem Relation Age of Onset  . Multiple myeloma Mother   . Diabetes Mother   . Cancer Sister     lymphoma  . Diabetes Sister   . Stroke Neg Hx     Social History   Social History  . Marital status: Married    Spouse name: Vaughan Basta  . Number of children: 3  . Years of education: 12   Occupational History  .      retired   Social History Main Topics  . Smoking status: Current Every Day Smoker    Packs/day: 1.00    Years: 60.00  . Smokeless tobacco: Never Used  . Alcohol use No  . Drug use: No  . Sexual activity: Not on file   Other Topics Concern  . Not on file   Social History Narrative   Patient is right handed   Patient resides with wife,consumes 2-3 cups of caffeine daily     Constitutional: Pt reports fatigue. Denies fever, malaise, headache or abrupt weight changes.  Neurological: Pt reports insomnia and difficulty with memory. Denies dizziness, difficulty with speech or problems with balance and coordination.  Psych: Pt has history of depression. Denies anxiety, SI/HI.  No other specific complaints in a complete review of systems (except as listed in HPI  above).     Objective:   Physical Exam   BP 128/76   Pulse 78   Temp 97.6 F (36.4 C) (Oral)   Wt 184 lb 12 oz (83.8 kg)   SpO2 97%   BMI 24.37 kg/m  Wt Readings from Last 3  Encounters:  11/04/16 184 lb 12 oz (83.8 kg)  03/12/16 176 lb 12 oz (80.2 kg)  02/26/16 174 lb 12 oz (79.3 kg)    General: Appears his stated age, chronically ill appearing, in NAD. Neurological: Alert oriented to person and place. Psychiatric: He is mildly anxious appearing today but he is smiling, and engaging in eye contact.    BMET    Component Value Date/Time   NA 141 02/26/2016 1115   NA 141 10/11/2014 1122   K 4.8 02/26/2016 1115   CL 102 02/26/2016 1115   CO2 33 (H) 02/26/2016 1115   GLUCOSE 103 (H) 02/26/2016 1115   BUN 9 02/26/2016 1115   BUN 11 10/11/2014 1122   CREATININE 0.82 02/26/2016 1115   CREATININE 0.72 03/26/2013 1608   CALCIUM 9.5 02/26/2016 1115   GFRNONAA >60 01/09/2016 1432   GFRAA >60 01/09/2016 1432    Lipid Panel     Component Value Date/Time   CHOL 176 02/26/2016 1115   TRIG 184.0 (H) 02/26/2016 1115   HDL 35.60 (L) 02/26/2016 1115   CHOLHDL 5 02/26/2016 1115   VLDL 36.8 02/26/2016 1115   LDLCALC 103 (H) 02/26/2016 1115    CBC    Component Value Date/Time   WBC 6.0 02/26/2016 1115   RBC 3.81 (L) 02/26/2016 1115   HGB 12.9 (L) 02/26/2016 1115   HCT 38.0 (L) 02/26/2016 1115   PLT 220.0 02/26/2016 1115   MCV 99.7 02/26/2016 1115   MCH 33.9 01/09/2016 1432   MCHC 33.9 02/26/2016 1115   RDW 12.9 02/26/2016 1115   RDW 13.5 10/11/2014 1122   LYMPHSABS 1.3 10/11/2014 1122   EOSABS 0.0 10/11/2014 1122   BASOSABS 0.0 10/11/2014 1122    Hgb A1C Lab Results  Component Value Date   HGBA1C 5.9 02/26/2016           Assessment & Plan:   Fatigue and Insomnia:  Will check CBC, CMET, TSH, B12 and Vit D If labs normal, consider starting Restoril at bedtime Advised him no to take Xanax or Zanaflex with Restoril  Will follow up after labs, RTC as  previously scheduled BAITY, REGINA, NP   I am concerned about polypharmacy in this situation

## 2016-11-04 NOTE — Telephone Encounter (Signed)
Pt daughter called (per DPR) to leave best phone # for call back regarding meds as discussed at his visit today. Her power is out so please call her cell [phone].

## 2016-11-05 ENCOUNTER — Ambulatory Visit: Payer: Self-pay | Admitting: Internal Medicine

## 2016-11-05 MED ORDER — TEMAZEPAM 7.5 MG PO CAPS: 7.5000 mg | ORAL_CAPSULE | Freq: Every evening | ORAL | 0 refills | Status: DC | PRN

## 2016-11-05 NOTE — Telephone Encounter (Signed)
Mel-FYI, you have a result note, daughter's number is below

## 2016-11-05 NOTE — Addendum Note (Signed)
Addended by: Lurlean Nanny on: 11/05/2016 05:10 PM   Modules accepted: Orders

## 2016-11-08 ENCOUNTER — Other Ambulatory Visit: Payer: Self-pay

## 2016-11-08 NOTE — Telephone Encounter (Signed)
Alexander Booth (DPR signed) left v/m requesting cb from [facility] about medication Alexander Booth spoke to her about.

## 2016-11-18 ENCOUNTER — Other Ambulatory Visit: Payer: Self-pay

## 2016-11-18 NOTE — Telephone Encounter (Signed)
Alexander Booth (DPR signed) left v/m requesting refill lorazepam to [facility]. Last printed # 20 x 2 on 09/20/16. Pt last seen 11/04/16. Appears pt taking daily rather than prn.Please advise.

## 2016-11-18 NOTE — Telephone Encounter (Signed)
I called [facility] and they stated that they did not have Rx from 09/20/2016 with 2 additional refills and pt did not currently have refills---Rx called in as originally instructed

## 2016-11-18 NOTE — Telephone Encounter (Signed)
His daughther knows this should be taken prn not daily. He only gets 20 in a 30 day period. He is not due for another refill until 6/2

## 2016-11-21 ENCOUNTER — Other Ambulatory Visit: Payer: Self-pay | Admitting: Internal Medicine

## 2016-11-21 DIAGNOSIS — R609 Edema, unspecified: Secondary | ICD-10-CM

## 2016-11-26 ENCOUNTER — Other Ambulatory Visit: Payer: Self-pay | Admitting: Internal Medicine

## 2016-12-02 ENCOUNTER — Ambulatory Visit (INDEPENDENT_AMBULATORY_CARE_PROVIDER_SITE_OTHER): Payer: Medicare HMO | Admitting: Podiatry

## 2016-12-02 DIAGNOSIS — M7751 Other enthesopathy of right foot: Secondary | ICD-10-CM | POA: Diagnosis not present

## 2016-12-02 DIAGNOSIS — E0842 Diabetes mellitus due to underlying condition with diabetic polyneuropathy: Secondary | ICD-10-CM

## 2016-12-02 MED ORDER — HYDROCODONE-ACETAMINOPHEN 7.5-300 MG PO TABS: 1.0000 | ORAL_TABLET | Freq: Three times a day (TID) | ORAL | 0 refills | Status: AC | PRN

## 2016-12-02 NOTE — Progress Notes (Signed)
   HPI: Patient is a 71 year old male presenting today for evaluation of pain in the plantar aspect of the right forefoot around the 3rd, 4th and 5th toes. Walking increases this pain. Resting without ambulation helps to alleviate the symptoms. He is here for further evaluation and treatment.     Physical Exam: General: The patient is alert and oriented x3 in no acute distress.  Dermatology: Skin is warm, dry and supple bilateral lower extremities. Negative for open lesions or macerations.  Vascular: Palpable pedal pulses bilaterally. No edema or erythema noted. Capillary refill within normal limits.  Neurological: Epicritic and protective threshold grossly intact bilaterally.   Musculoskeletal Exam: Tenderness with palpation to digits 3-5 of the right foot. Range of motion within normal limits to all pedal and ankle joints bilateral. Muscle strength 5/5 in all groups bilateral.   Radiographic Exam:  Normal osseous mineralization. Joint spaces preserved. No fracture/dislocation/boney destruction.  Assessment: 1. DM neuropathy right foot digits 3-5 2. Capsulitis 2nd MPJ-right   Plan of Care:  1. Patient was evaluated. 2. Injection of Celestone Soluspan 0.5 mLs injected into the 2nd MPJ of the right foot. 3. Recommended increasing dosage of Gabapentin through PCP. 4. Prescription for Vicodin 7.5/325mg  #30 given to patient. 5. Return to clinic when necessary.   Edrick Kins, DPM [facility]  Dr. Edrick Kins, [address]                                        [place], [place] [address]                Office [phone]  Fax [phone]

## 2016-12-06 MED ORDER — BETAMETHASONE SOD PHOS & ACET 6 (3-3) MG/ML IJ SUSP: 3.0000 mg | Freq: Once | INTRAMUSCULAR | Status: DC

## 2016-12-16 ENCOUNTER — Encounter (HOSPITAL_COMMUNITY): Payer: Self-pay | Admitting: Emergency Medicine

## 2016-12-16 ENCOUNTER — Emergency Department (HOSPITAL_COMMUNITY): Payer: Medicare Other

## 2016-12-16 ENCOUNTER — Inpatient Hospital Stay (HOSPITAL_COMMUNITY)
Admission: EM | Admit: 2016-12-16 | Discharge: 2016-12-18 | DRG: 194 | Disposition: A | Discharge status: Home / Self Care | Payer: Medicare Other | Attending: Nephrology | Admitting: Nephrology

## 2016-12-16 DIAGNOSIS — D696 Thrombocytopenia, unspecified: Secondary | ICD-10-CM | POA: Diagnosis present

## 2016-12-16 DIAGNOSIS — J189 Pneumonia, unspecified organism: Secondary | ICD-10-CM

## 2016-12-16 DIAGNOSIS — J449 Chronic obstructive pulmonary disease, unspecified: Secondary | ICD-10-CM | POA: Diagnosis not present

## 2016-12-16 DIAGNOSIS — F17210 Nicotine dependence, cigarettes, uncomplicated: Secondary | ICD-10-CM | POA: Diagnosis not present

## 2016-12-16 DIAGNOSIS — E871 Hypo-osmolality and hyponatremia: Secondary | ICD-10-CM | POA: Diagnosis present

## 2016-12-16 DIAGNOSIS — Z833 Family history of diabetes mellitus: Secondary | ICD-10-CM

## 2016-12-16 DIAGNOSIS — Z7984 Long term (current) use of oral hypoglycemic drugs: Secondary | ICD-10-CM

## 2016-12-16 DIAGNOSIS — E785 Hyperlipidemia, unspecified: Secondary | ICD-10-CM | POA: Diagnosis not present

## 2016-12-16 DIAGNOSIS — Z96652 Presence of left artificial knee joint: Secondary | ICD-10-CM | POA: Diagnosis not present

## 2016-12-16 DIAGNOSIS — F0391 Unspecified dementia with behavioral disturbance: Secondary | ICD-10-CM | POA: Diagnosis present

## 2016-12-16 DIAGNOSIS — F329 Major depressive disorder, single episode, unspecified: Secondary | ICD-10-CM | POA: Diagnosis present

## 2016-12-16 DIAGNOSIS — E7800 Pure hypercholesterolemia, unspecified: Secondary | ICD-10-CM | POA: Diagnosis present

## 2016-12-16 DIAGNOSIS — Z79899 Other long term (current) drug therapy: Secondary | ICD-10-CM | POA: Diagnosis not present

## 2016-12-16 DIAGNOSIS — E1121 Type 2 diabetes mellitus with diabetic nephropathy: Secondary | ICD-10-CM | POA: Diagnosis present

## 2016-12-16 DIAGNOSIS — R509 Fever, unspecified: Secondary | ICD-10-CM | POA: Diagnosis not present

## 2016-12-16 DIAGNOSIS — J479 Bronchiectasis, uncomplicated: Secondary | ICD-10-CM | POA: Diagnosis not present

## 2016-12-16 DIAGNOSIS — I10 Essential (primary) hypertension: Secondary | ICD-10-CM | POA: Diagnosis present

## 2016-12-16 DIAGNOSIS — F17200 Nicotine dependence, unspecified, uncomplicated: Secondary | ICD-10-CM

## 2016-12-16 DIAGNOSIS — J181 Lobar pneumonia, unspecified organism: Principal | ICD-10-CM | POA: Diagnosis present

## 2016-12-16 DIAGNOSIS — J439 Emphysema, unspecified: Secondary | ICD-10-CM

## 2016-12-16 DIAGNOSIS — E119 Type 2 diabetes mellitus without complications: Secondary | ICD-10-CM | POA: Diagnosis not present

## 2016-12-16 DIAGNOSIS — F0390 Unspecified dementia without behavioral disturbance: Secondary | ICD-10-CM | POA: Diagnosis present

## 2016-12-16 DIAGNOSIS — Z66 Do not resuscitate: Secondary | ICD-10-CM | POA: Diagnosis not present

## 2016-12-16 DIAGNOSIS — Z823 Family history of stroke: Secondary | ICD-10-CM

## 2016-12-16 DIAGNOSIS — E118 Type 2 diabetes mellitus with unspecified complications: Secondary | ICD-10-CM | POA: Diagnosis present

## 2016-12-16 DIAGNOSIS — Z7982 Long term (current) use of aspirin: Secondary | ICD-10-CM

## 2016-12-16 DIAGNOSIS — Z809 Family history of malignant neoplasm, unspecified: Secondary | ICD-10-CM

## 2016-12-16 DIAGNOSIS — Z791 Long term (current) use of non-steroidal anti-inflammatories (NSAID): Secondary | ICD-10-CM

## 2016-12-16 DIAGNOSIS — F03918 Unspecified dementia, unspecified severity, with other behavioral disturbance: Secondary | ICD-10-CM | POA: Diagnosis present

## 2016-12-16 HISTORY — DX: Chronic obstructive pulmonary disease, unspecified: J44.9

## 2016-12-16 LAB — COMPREHENSIVE METABOLIC PANEL
ALK PHOS: 51 U/L (ref 38–126)
ALT: 9 U/L — ABNORMAL LOW (ref 17–63)
ANION GAP: 11 (ref 5–15)
AST: 21 U/L (ref 15–41)
Albumin: 3.8 g/dL (ref 3.5–5.0)
BILIRUBIN TOTAL: 0.7 mg/dL (ref 0.3–1.2)
BUN: 15 mg/dL (ref 6–20)
CALCIUM: 8.8 mg/dL — AB (ref 8.9–10.3)
CO2: 27 mmol/L (ref 22–32)
Chloride: 92 mmol/L — ABNORMAL LOW (ref 101–111)
Creatinine, Ser: 1.16 mg/dL (ref 0.61–1.24)
GFR calc Af Amer: >60 mL/min (ref >60)
GFR calc non Af Amer: >60 mL/min (ref >60)
Glucose, Bld: 174 mg/dL — ABNORMAL HIGH (ref 65–99)
POTASSIUM: 3.4 mmol/L — AB (ref 3.5–5.1)
Sodium: 130 mmol/L — ABNORMAL LOW (ref 135–145)
TOTAL PROTEIN: 7.1 g/dL (ref 6.5–8.1)

## 2016-12-16 LAB — CBC WITH DIFFERENTIAL/PLATELET
BASOS ABS: 0 10*3/uL (ref 0.0–0.1)
BASOS PCT: 0 %
Eosinophils Absolute: 0 10*3/uL (ref 0.0–0.7)
Eosinophils Relative: 0 %
HEMATOCRIT: 40 % (ref 39.0–52.0)
HEMOGLOBIN: 13.4 g/dL (ref 13.0–17.0)
LYMPHS PCT: 9 %
Lymphs Abs: 0.5 10*3/uL — ABNORMAL LOW (ref 0.7–4.0)
MCH: 32.3 pg (ref 26.0–34.0)
MCHC: 33.5 g/dL (ref 30.0–36.0)
MCV: 96.4 fL (ref 78.0–100.0)
Monocytes Absolute: 0.3 10*3/uL (ref 0.1–1.0)
Monocytes Relative: 5 %
NEUTROS ABS: 4.8 10*3/uL (ref 1.7–7.7)
NEUTROS PCT: 86 %
Platelets: 120 10*3/uL — ABNORMAL LOW (ref 150–400)
RBC: 4.15 MIL/uL — ABNORMAL LOW (ref 4.22–5.81)
RDW: 12.8 % (ref 11.5–15.5)
WBC: 5.6 10*3/uL (ref 4.0–10.5)

## 2016-12-16 LAB — URINALYSIS, ROUTINE W REFLEX MICROSCOPIC
Bilirubin Urine: NEGATIVE
Glucose, UA: NEGATIVE mg/dL
Hgb urine dipstick: NEGATIVE
Ketones, ur: 5 mg/dL — AB
LEUKOCYTES UA: NEGATIVE
NITRITE: NEGATIVE
PH: 6 (ref 5.0–8.0)
PROTEIN: NEGATIVE mg/dL
SPECIFIC GRAVITY, URINE: 1.013 (ref 1.005–1.030)

## 2016-12-16 LAB — I-STAT CG4 LACTIC ACID, ED: Lactic Acid, Venous: 1.52 mmol/L (ref 0.5–1.9)

## 2016-12-16 MED ORDER — VANCOMYCIN HCL 10 G IV SOLR
1250.0000 mg | Freq: Once | INTRAVENOUS | Status: AC
  Administered 2016-12-16: 1250 mg via INTRAVENOUS
  Filled 2016-12-16: qty 1250

## 2016-12-16 MED ORDER — SODIUM CHLORIDE 0.9 % IV BOLUS (SEPSIS)
2000.0000 mL | Freq: Once | INTRAVENOUS | Status: AC
  Administered 2016-12-16: 2000 mL via INTRAVENOUS

## 2016-12-16 MED ORDER — PIPERACILLIN-TAZOBACTAM 3.375 G IVPB 30 MIN
3.3750 g | Freq: Once | INTRAVENOUS | Status: AC
  Administered 2016-12-16: 3.375 g via INTRAVENOUS
  Filled 2016-12-16: qty 50

## 2016-12-16 NOTE — ED Notes (Signed)
Patient transported to x-ray. ?

## 2016-12-16 NOTE — H&P (Signed)
History and Physical    Alexander Booth [facility] STM:[id number] DOB: 10-07-1945 DOA: 12/16/2016  PCP: Alexander Fenton, NP Consultants:  Pain management - [place] Patient coming from: Home - lives with wife; NOK: wife and daughter, [phone] [name], daughter)  Chief Complaint: Febrile illness  HPI: Alexander Booth is a 71 y.o. male with medical history significant of dementia, HTN, HLD, depression, and COPD (not on home O2) presenting because he "felt bad". He has been sick since Friday - chills, sleeping constantly, "sweating like everything". No appetite. +cough since today, dry cough. +wheezing (has h/o COPD).  He has been using inhalers with some improvement.  Wife with cough x 3 weeks. His temperature was 102.6 by EMS, has not checked it prior.  +SOB, intermittent.  He was very confused this afternoon, currently a whole lot better after IVF.   ED Course: Multilobar PNA on CT, not evident on CXR  Review of Systems: As per HPI; otherwise review of systems reviewed and negative.   Ambulatory Status:  Ambulates with a cane at times  Past Medical History:  Diagnosis Date  . Anginal pain ([place])   . Bronchitis    Subacute and hx of ongoing cigarette smoking one half pack daily  . COPD (chronic obstructive pulmonary disease) ([place])   . Dementia    He has a hx of the   . Depression   . Erectile dysfunction    He's had a hx of   . FHx: heart disease    Positive for   . Fracture of lumbar vertebra ([place]) 11/25/2012  . Hard of hearing    very little hearing on left side;better in right ear  . Hypercholesterolemia   . Hypertension    Essential  . Lumbosacral disc disease    with chronic low back pain, has implanted nerve stimulator  . Peripheral neuropathy   . Presenile dementia   . Type 2 diabetes mellitus ([place]) 2010   takes oral meds    Past Surgical History:  Procedure Laterality Date  . ANTERIOR CERVICAL DECOMP/DISCECTOMY FUSION    . APPENDECTOMY    . HEMORRHOID SURGERY     x2    . JOINT REPLACEMENT Left    2nd one was to change siZe  . KNEE SURGERY     Previous left total replacement  . KYPHOPLASTY N/A 12/10/2012   Procedure: Lumbar one Kyphoplasty;  Surgeon: Kristeen Miss, MD;  Location: [place] [facility];  Service: Neurosurgery;  Laterality: N/A;  Lumbar One Kyphoplasty  . OTHER SURGICAL HISTORY  1992   Bone Fusion in Right Foot  . SPINAL CORD STIMULATOR INSERTION N/A 12/02/2014   Procedure: THORACIC SPINAL CORD STIMULATOR INSERTION INFINION [employer] 16 LEAD WITH LAMINECTOMY;  Surgeon: Erline Levine, MD;  Location: [place] [facility];  Service: Neurosurgery;  Laterality: N/A;  THORACIC SPINAL CORD STIMULATOR INSERTION INFINION [employer] 16 LEAD WITH LAMINECTOMY  . TOTAL KNEE ARTHROPLASTY Left     Social History   Social History  . Marital status: Married    Spouse name: Vaughan Basta  . Number of children: 3  . Years of education: 12   Occupational History  . retired     retired   Social History Main Topics  . Smoking status: Current Every Day Smoker    Packs/day: 1.00    Years: 40.00  . Smokeless tobacco: Never Used  . Alcohol use No  . Drug use: No  . Sexual activity: Not on file   Other Topics Concern  . Not  on file   Social History Narrative   Patient is right handed   Patient resides with wife,consumes 2-3 cups of caffeine daily    Allergies  Allergen Reactions  . Codeine Hives and Itching    Family History  Problem Relation Age of Onset  . Multiple myeloma Mother   . Diabetes Mother   . Cancer Sister        lymphoma  . Diabetes Sister   . Stroke Neg Hx     Prior to Admission medications   Medication Sig Start Date End Date Taking? Authorizing Provider  aspirin 81 MG chewable tablet Chew by mouth daily.    [provider]  bisacodyl (DULCOLAX) 5 MG EC tablet Take by mouth. 01/15/16   [provider]  Blood Glucose Monitoring Suppl (ACCU-CHEK NANO SMARTVIEW) W/DEVICE KIT CHECK BLOOD SUGAR ONCE DAILY AND AS  DIRECTED. 05/10/15   Alexander Fenton, NP  divalproex (DEPAKOTE ER) 500 MG 24 hr tablet Take 2 tablets (1,000 mg total) by mouth daily. 10/24/16   Alexander Fenton, NP  donepezil (ARICEPT) 10 MG tablet Take 1 tablet (10 mg total) by mouth every morning. 10/10/15   Star Age, MD  fluticasone (FLOVENT HFA) 110 MCG/ACT inhaler Inhale 1 puff into the lungs 2 (two) times daily. 02/27/16   Alexander Fenton, NP  gabapentin (NEURONTIN) 300 MG capsule TAKE 1 CAPSULE TWICE DAILY 10/14/16   Viviana Simpler I, MD  hydrochlorothiazide (HYDRODIURIL) 25 MG tablet TAKE 2 TABLETS (50 MG TOTAL)  DAILY. 11/22/16   Alexander Fenton, NP  Hydrocodone-Acetaminophen (VICODIN ES) 7.5-300 MG TABS Take 1 tablet by mouth every 8 (eight) hours as needed. 12/02/16 01/01/17  Edrick Kins, DPM  LORazepam (ATIVAN) 0.5 MG tablet Take 1 tablet (0.5 mg total) by mouth daily as needed for anxiety. 09/20/16   Alexander Fenton, NP  meloxicam (MOBIC) 15 MG tablet Take 15 mg by mouth daily.    Edrick Kins, DPM  memantine (NAMENDA XR) 7 MG CP24 24 hr capsule Take 1 capsule (7 mg total) by mouth every morning. 04/17/16   Alexander Fenton, NP  metFORMIN (GLUCOPHAGE) 500 MG tablet TAKE 1 TABLET TWICE DAILY 08/13/16   Alexander Fenton, NP  mirtazapine (REMERON) 45 MG tablet Take 1 tablet (45 mg total) by mouth at bedtime. 08/13/16   Alexander Fenton, NP  nitroGLYCERIN (NITROSTAT) 0.4 MG SL tablet Place under the tongue. 01/15/16   [provider]  NONFORMULARY OR COMPOUNDED [place]:  Peripheral Neuropathy cream - Bupivacaine 1%, Doxepin 3%, Gabapentin 6%, Pentoxifylline 3%, Topiramate 1%, apply to affected area 1-2 grams 3-4 times daily 03/21/16   Hyatt, Max T, DPM  simvastatin (ZOCOR) 40 MG tablet Take 1 tablet (40 mg total) by mouth daily at 6 PM. Patient taking differently: Take 40 mg by mouth every other day.  12/01/14   Darlin Coco, MD  temazepam (RESTORIL) 7.5 MG capsule Take 1 capsule (7.5 mg total) by mouth at bedtime as  needed for sleep. 11/05/16   Alexander Fenton, NP  tiZANidine (ZANAFLEX) 4 MG tablet Take 4 mg by mouth every 6 (six) hours as needed for muscle spasms.  03/21/14   [provider]  VENTOLIN HFA 108 (90 Base) MCG/ACT inhaler INHALE 2 PUFFS INTO THE LUNGS EVERY 6 (SIX) HOURS AS NEEDED FOR WHEEZING OR SHORTNESS OF BREATH. 11/26/16   Alexander Fenton, NP    Physical Exam: Vitals:   12/17/16 0045 12/17/16 0100 12/17/16 0115 12/17/16  0119  BP: 136/61 (!) 132/59 (!) 134/55   Pulse: 67     Resp: 17 (!) 24 15   Temp:    98.2 F (36.8 C)  TempSrc:    Oral  SpO2: 92%        General: Appears calm and comfortable and is NAD Eyes:  PERRL, EOMI, normal lids, iris ENT:  grossly normal hearing, lips & tongue, mmm Neck:  no LAD, masses or thyromegaly Cardiovascular:  RRR, no m/r/g. No LE edema.  Respiratory:  Ronchorous breath sounds throughout most of the left lung, CTA on the right. Normal respiratory effort. Abdomen:  soft, ntnd, NABS Skin:  no rash or induration seen on limited exam Musculoskeletal:  grossly normal tone BUE/BLE, good ROM, no bony abnormality Psychiatric: grossly normal mood and affect, speech fluent and appropriate, AOx3 Neurologic:  CN 2-12 grossly intact, moves all extremities in coordinated fashion, sensation intact  Labs on Admission: I have personally reviewed following labs and imaging studies  CBC:  Recent Labs Lab 12/16/16 2140  WBC 5.6  NEUTROABS 4.8  HGB 13.4  HCT 40.0  MCV 96.4  PLT 564*   Basic Metabolic Panel:  Recent Labs Lab 12/16/16 2140  NA 130*  K 3.4*  CL 92*  CO2 27  GLUCOSE 174*  BUN 15  CREATININE 1.16  CALCIUM 8.8*   GFR: CrCl cannot be calculated (Unknown ideal weight.). Liver Function Tests:  Recent Labs Lab 12/16/16 2140  AST 21  ALT 9*  ALKPHOS 51  BILITOT 0.7  PROT 7.1  ALBUMIN 3.8   No results for input(s): LIPASE, AMYLASE in the last 168 hours. No results for input(s): AMMONIA in the last 168  hours. Coagulation Profile: No results for input(s): INR, PROTIME in the last 168 hours. Cardiac Enzymes: No results for input(s): CKTOTAL, CKMB, CKMBINDEX, TROPONINI in the last 168 hours. BNP (last 3 results) No results for input(s): PROBNP in the last 8760 hours. HbA1C: No results for input(s): HGBA1C in the last 72 hours. CBG: No results for input(s): GLUCAP in the last 168 hours. Lipid Profile: No results for input(s): CHOL, HDL, LDLCALC, TRIG, CHOLHDL, LDLDIRECT in the last 72 hours. Thyroid Function Tests: No results for input(s): TSH, T4TOTAL, FREET4, T3FREE, THYROIDAB in the last 72 hours. Anemia Panel: No results for input(s): VITAMINB12, FOLATE, FERRITIN, TIBC, IRON, RETICCTPCT in the last 72 hours. Urine analysis:    Component Value Date/Time   COLORURINE YELLOW 12/16/2016 [place] 12/16/2016 2343   LABSPEC 1.013 12/16/2016 2343   PHURINE 6.0 12/16/2016 2343   GLUCOSEU NEGATIVE 12/16/2016 2343   HGBUR NEGATIVE 12/16/2016 2343   BILIRUBINUR NEGATIVE 12/16/2016 2343   BILIRUBINUR neg 02/07/2014 1623   KETONESUR 5 (A) 12/16/2016 2343   PROTEINUR NEGATIVE 12/16/2016 2343   UROBILINOGEN 0.2 02/07/2014 1623   NITRITE NEGATIVE 12/16/2016 2343   LEUKOCYTESUR NEGATIVE 12/16/2016 2343    Creatinine Clearance: CrCl cannot be calculated (Unknown ideal weight.).  Sepsis Labs: _0 (procalcitonin:4,lacticidven:4) )No results found for this or any previous visit (from the past 240 hour(s)).   Radiological Exams on Admission: Dg Chest 2 View  Result Date: 12/16/2016 CLINICAL DATA:  Fever. EXAM: CHEST  2 VIEW COMPARISON:  March 12, 2016 FINDINGS: The heart size and mediastinal contours are within normal limits. Both lungs are clear. The visualized skeletal structures are unremarkable. IMPRESSION: No active cardiopulmonary disease. Electronically Signed   By: Dorise Bullion III M.D   On: 12/16/2016 21:32   Ct Chest Wo Contrast  Result  Date:  12/16/2016 CLINICAL DATA:  Fever and cough, tobacco use. EXAM: CT CHEST WITHOUT CONTRAST TECHNIQUE: Multidetector CT imaging of the chest was performed following the standard protocol without IV contrast. COMPARISON:  None. FINDINGS: Cardiovascular: The normal size cardiac chambers without pericardial effusion. Coronary arteriosclerosis is noted. There is aortic atherosclerosis as well without aneurysm. Mediastinum/Nodes: Small mediastinal lymph nodes are noted. Findings are likely reactive in etiology. The hilum is difficult to assess given lack of IV contrast. The trachea, mainstem bronchi and esophagus are unremarkable. Thyroid gland is normal in size without nodularity. Lungs/Pleura: Bilateral bronchiectasis especially to the lower lobes left greater than right. Patchy airspace opacities in the left lower lobe concerning for pneumonia. More faint ground-glass opacities are seen in the right middle lobe, lingula and superior segment of left lower lobe consistent with alveolitis or pneumonitis. Upper Abdomen: No acute abnormality. Musculoskeletal: Neural stimulating device is seen along the mid thoracic spine with evidence of upper lumbar spinal fusion hardware and kyphoplasty. IMPRESSION: 1. Bilateral bronchiectasis more so within the left lower lobe. 2. Patchy airspace opacities in the left lower lobe concerning for pneumonia with more faint ground-glass opacities in the right middle lobe and lingula as well superior segment left lower lobe. 3. Coronary arteriosclerosis and aortic atherosclerosis. Electronically Signed   By: Ashley Royalty M.D.   On: 12/16/2016 23:37    EKG: Not done  Assessment/Plan Principal Problem:   Multifocal pneumonia Active Problems:   Type 2 diabetes mellitus with complication (HCC)   Emphysema of lung (HCC)   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Dyslipidemia   Essential hypertension   Thrombocytopenia (HCC)   Tobacco dependence   PNA -Given SOB, cough, fever to 102,  and multifocal infiltrate on chest x-ray , most likely community-acquired pneumonia.  - Other etiologies include aspiration (if altered mental status was more severe) versus URI (most likely viral). -Influenza pending. -CURB-65 score is 1 - will admit the patient to Med Surg. -Will start Azithromycin 500 mg daily AND Rocephin due to no risk factors for MDR cause.  He was given Zosyn and Vanc in the ER but this will not be continued at this time. -NS @ 75cc/hr -Fever control -Repeat CBC in am -Sputum cultures -Blood cultures -Strep pneumo testing -Will order non-ICU procalcitonin levels.  >0.5 indicates infection and >>0.5 indicates more serious disease.  As the procalcitonin level normalizes, it will be reasonable to consider de-escalation of antibiotic coverage. -albuterol PRN -Standing Duonebs -Lactate 1.52  Dementia -He is A&O x 3 and very appropriate -Despite this, he is taking both Aricept and Namenda -He is also taking Depakote (?behavioral), will check level -Will also continue Ativan and Remeron  COPD -Uses Flovent and Albuterol MDIs at home -Continue Flovent -Use Duonebs standing and Albuterol prn as inpatient -Currently not thought to be a large contributor -Tobacco Dependence: encourage cessation.   -Patch ordered.  HTN -There is little utility in increasing HCTZ to BID but it does significantly increase the risk of side effects; will decrease to 25 mg PO daily -He does not appear to be taking ACE and would likely benefit from this medication  HLD -Continue Zocor  DM -Glucose 174 -Hold Glucophage -Check A1c -Cover with moderate scale SSI  Thrombocytopenia -Platelets 120, 186 on 4/16 -Will follow  DVT prophylaxis: Lovenox  Code Status: DNR - confirmed with patient/family Family Communication: Wife and daughter present throughout evaluation  Disposition Plan:  Home once clinically improved Consults called: None  Admission status: Admit - It  is my clinical  opinion that admission to INPATIENT is reasonable and necessary because this patient will require at least 2 midnights in the hospital to treat this condition based on the medical complexity of the problems presented.  Given the aforementioned information, the predictability of an adverse outcome is felt to be significant.    Karmen Bongo MD [facility]  If 7PM-7AM, please contact night-coverage [email or web address] Password [email or web address]  12/17/2016, 1:27 AM

## 2016-12-16 NOTE — ED Triage Notes (Addendum)
Per [facility]: Pt to ED from home c/o new onset fevers today along with non-productive cough since Friday. Per EMS, patient lives with family, in which upper respiratory symptoms have been going around. Fever as high as 102F at home, as well as with EMS. Given 1g. Tylenol PTA, temp now 98.83F. Hx dementia, however EMS states patient has exhibit more confusion since Friday. HxCOPD and emphysema - not on O2 at home, usually runs high 80s/low 90s, 91% RA with EMS. Patient denies SOB/CP, just increased fatigue. EMS VS: 130/60, HR 82 NSR, RR 20, CBG 140. Resp currently e/u, skin sweaty.

## 2016-12-16 NOTE — ED Provider Notes (Signed)
[facility] DEPT Provider Note   CSN: [id number] Arrival date & time: 12/16/16  2055     History   Chief Complaint Chief Complaint  Patient presents with  . Fever    HPI Alexander Booth is a 71 y.o. male.  HPI Pt presents with cough, fever and some SOB. No rash or abdominal pain. Denies n/v/d. Denies urinary symptoms. Symptoms are moderate in severity. Family reports some confusion today off from baseline. No other sick contacts.    Past Medical History:  Diagnosis Date  . Anginal pain ([place])   . Bronchitis    Subacute and hx of ongoing cigarette smoking one half pack daily  . Dementia    He has a hx of the   . Depression   . Diabetes mellitus    He's also had a hx of high cholesterol   . Dyslipidemia    He also has a past hx of   . Erectile dysfunction    He's had a hx of   . FHx: heart disease    Positive for   . Fracture of lumbar vertebra ([place]) 11/25/2012  . Hard of hearing    very little hearing on left side;better in right ear  . Hypercholesterolemia   . Hypertension    Essential  . Lumbosacral disc disease    with chronic low back pain  . Numbness and tingling in hands    and feet  . Peripheral neuropathy   . Peripheral neuropathy    He also has   . Presenile dementia   . Type 2 diabetes mellitus ([place]) 2010   takes oral meds    Patient Active Problem List   Diagnosis Date Noted  . Dyslipidemia 01/13/2016  . Essential hypertension 01/13/2016  . Peripheral neuropathy 01/13/2016  . 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01/10/2016  . Sleep disturbance 10/25/2014  . Emphysema of lung ([place]) 10/25/2014  . Chronic back pain 10/26/2013  . Lumbar vertebral fracture ([place]) 12/10/2012  . Benign hypertensive heart disease without heart failure 06/18/2011  . Type 2 diabetes mellitus with complication ([place]) 37/62/8315  . Pure hypercholesterolemia 06/18/2011  . Osteoarthritis 06/18/2011    Past Surgical History:  Procedure Laterality Date  . ANTERIOR  CERVICAL DECOMP/DISCECTOMY FUSION    . APPENDECTOMY    . HEMORRHOID SURGERY     x2  . JOINT REPLACEMENT Left    2nd one was to change siZe  . KNEE SURGERY     Previous left total replacement  . KYPHOPLASTY N/A 12/10/2012   Procedure: Lumbar one Kyphoplasty;  Surgeon: Kristeen Miss, MD;  Location: [facility];  Service: Neurosurgery;  Laterality: N/A;  Lumbar One Kyphoplasty  . OTHER SURGICAL HISTORY  1992   Bone Fusion in Right Foot  . SPINAL CORD STIMULATOR INSERTION N/A 12/02/2014   Procedure: THORACIC SPINAL CORD STIMULATOR INSERTION INFINION [employer] 16 LEAD WITH LAMINECTOMY;  Surgeon: Erline Levine, MD;  Location: [facility];  Service: Neurosurgery;  Laterality: N/A;  THORACIC SPINAL CORD STIMULATOR INSERTION INFINION [employer] 16 LEAD WITH LAMINECTOMY  . TOTAL KNEE ARTHROPLASTY Left        Home Medications    Prior to Admission medications   Medication Sig Start Date End Date Taking? Authorizing Provider  aspirin 81 MG chewable tablet Chew by mouth daily.    [provider]  bisacodyl (DULCOLAX) 5 MG EC tablet Take by mouth. 01/15/16   [provider]  Blood Glucose Monitoring Suppl (ACCU-CHEK NANO SMARTVIEW) W/DEVICE KIT CHECK BLOOD  SUGAR ONCE DAILY AND AS DIRECTED. 05/10/15   [name], NP  divalproex (DEPAKOTE ER) 500 MG 24 hr tablet Take 2 tablets (1,000 mg total) by mouth daily. 10/24/16   [name], NP  donepezil (ARICEPT) 10 MG tablet Take 1 tablet (10 mg total) by mouth every morning. 10/10/15   Star Age, MD  fluticasone (FLOVENT HFA) 110 MCG/ACT inhaler Inhale 1 puff into the lungs 2 (two) times daily. 02/27/16   [name], NP  gabapentin (NEURONTIN) 300 MG capsule TAKE 1 CAPSULE TWICE DAILY 10/14/16   Viviana Simpler I, MD  hydrochlorothiazide (HYDRODIURIL) 25 MG tablet TAKE 2 TABLETS (50 MG TOTAL)  DAILY. 11/22/16   [name], NP  Hydrocodone-Acetaminophen (VICODIN ES) 7.5-300 MG TABS Take 1 tablet by mouth every 8  (eight) hours as needed. 12/02/16 01/01/17  Edrick Kins, DPM  LORazepam (ATIVAN) 0.5 MG tablet Take 1 tablet (0.5 mg total) by mouth daily as needed for anxiety. 09/20/16   [name], NP  meloxicam (MOBIC) 15 MG tablet Take 15 mg by mouth daily.    Edrick Kins, DPM  memantine (NAMENDA XR) 7 MG CP24 24 hr capsule Take 1 capsule (7 mg total) by mouth every morning. 04/17/16   [name], NP  metFORMIN (GLUCOPHAGE) 500 MG tablet TAKE 1 TABLET TWICE DAILY 08/13/16   [name], NP  mirtazapine (REMERON) 45 MG tablet Take 1 tablet (45 mg total) by mouth at bedtime. 08/13/16   [name], NP  nitroGLYCERIN (NITROSTAT) 0.4 MG SL tablet Place under the tongue. 01/15/16   [provider]  NONFORMULARY OR COMPOUNDED [place]:  Peripheral Neuropathy cream - Bupivacaine 1%, Doxepin 3%, Gabapentin 6%, Pentoxifylline 3%, Topiramate 1%, apply to affected area 1-2 grams 3-4 times daily 03/21/16   Hyatt, Max T, DPM  simvastatin (ZOCOR) 40 MG tablet Take 1 tablet (40 mg total) by mouth daily at 6 PM. Patient taking differently: Take 40 mg by mouth every other day.  12/01/14   Darlin Coco, MD  temazepam (RESTORIL) 7.5 MG capsule Take 1 capsule (7.5 mg total) by mouth at bedtime as needed for sleep. 11/05/16   [name], NP  tiZANidine (ZANAFLEX) 4 MG tablet Take 4 mg by mouth every 6 (six) hours as needed for muscle spasms.  03/21/14   [provider]  VENTOLIN HFA 108 (90 Base) MCG/ACT inhaler INHALE 2 PUFFS INTO THE LUNGS EVERY 6 (SIX) HOURS AS NEEDED FOR WHEEZING OR SHORTNESS OF BREATH. 11/26/16   [name], NP    Family History Family History  Problem Relation Age of Onset  . Multiple myeloma Mother   . Diabetes Mother   . Cancer Sister        lymphoma  . Diabetes Sister   . Stroke Neg Hx     Social History Social History  Substance Use Topics  . Smoking status: Current Every Day Smoker    Packs/day: 1.00    Years: 60.00  . Smokeless  tobacco: Never Used  . Alcohol use No     Allergies   Codeine   Review of Systems Review of Systems  All other systems reviewed and are negative.    Physical Exam Updated Vital Signs BP 129/66   Pulse 72   Temp 98.4 F (36.9 C) (Oral)   Resp 18   SpO2 92%   Physical Exam  Constitutional: He is oriented to person, place, and time. He appears well-developed and  well-nourished.  HENT:  Head: Normocephalic and atraumatic.  Eyes: EOM are normal.  Neck: Normal range of motion.  Cardiovascular: Normal rate, regular rhythm, normal heart sounds and intact distal pulses.   Pulmonary/Chest: Effort normal and breath sounds normal. No respiratory distress.  Rhonchi left lower lobe  Abdominal: Soft. He exhibits no distension. There is no tenderness.  Musculoskeletal: Normal range of motion.  Neurological: He is alert and oriented to person, place, and time.  Skin: Skin is warm and dry.  Psychiatric: He has a normal mood and affect. Judgment normal.  Nursing note and vitals reviewed.    ED Treatments / Results  Labs (all labs ordered are listed, but only abnormal results are displayed) Labs Reviewed  COMPREHENSIVE METABOLIC PANEL - Abnormal; Notable for the following:       Result Value   Sodium 130 (*)    Potassium 3.4 (*)    Chloride 92 (*)    Glucose, Bld 174 (*)    Calcium 8.8 (*)    ALT 9 (*)    All other components within normal limits  CBC WITH DIFFERENTIAL/PLATELET - Abnormal; Notable for the following:    RBC 4.15 (*)    Platelets 120 (*)    Lymphs Abs 0.5 (*)    All other components within normal limits  CULTURE, BLOOD (ROUTINE X 2)  CULTURE, BLOOD (ROUTINE X 2)  URINALYSIS, ROUTINE W REFLEX MICROSCOPIC  INFLUENZA PANEL BY PCR (TYPE A & B)  I-STAT CG4 LACTIC ACID, ED  I-STAT CG4 LACTIC ACID, ED    EKG  EKG Interpretation None       Radiology Dg Chest 2 View  Result Date: 12/16/2016 CLINICAL DATA:  Fever. EXAM: CHEST  2 VIEW COMPARISON:  March 12, 2016 FINDINGS: The heart size and mediastinal contours are within normal limits. Both lungs are clear. The visualized skeletal structures are unremarkable. IMPRESSION: No active cardiopulmonary disease. Electronically Signed   By: Dorise Bullion III M.D   On: 12/16/2016 21:32   Ct Chest Wo Contrast  Result Date: 12/16/2016 CLINICAL DATA:  Fever and cough, tobacco use. EXAM: CT CHEST WITHOUT CONTRAST TECHNIQUE: Multidetector CT imaging of the chest was performed following the standard protocol without IV contrast. COMPARISON:  None. FINDINGS: Cardiovascular: The normal size cardiac chambers without pericardial effusion. Coronary arteriosclerosis is noted. There is aortic atherosclerosis as well without aneurysm. Mediastinum/Nodes: Small mediastinal lymph nodes are noted. Findings are likely reactive in etiology. The hilum is difficult to assess given lack of IV contrast. The trachea, mainstem bronchi and esophagus are unremarkable. Thyroid gland is normal in size without nodularity. Lungs/Pleura: Bilateral bronchiectasis especially to the lower lobes left greater than right. Patchy airspace opacities in the left lower lobe concerning for pneumonia. More faint ground-glass opacities are seen in the right middle lobe, lingula and superior segment of left lower lobe consistent with alveolitis or pneumonitis. Upper Abdomen: No acute abnormality. Musculoskeletal: Neural stimulating device is seen along the mid thoracic spine with evidence of upper lumbar spinal fusion hardware and kyphoplasty. IMPRESSION: 1. Bilateral bronchiectasis more so within the left lower lobe. 2. Patchy airspace opacities in the left lower lobe concerning for pneumonia with more faint ground-glass opacities in the right middle lobe and lingula as well superior segment left lower lobe. 3. Coronary arteriosclerosis and aortic atherosclerosis. Electronically Signed   By: Ashley Royalty M.D.   On: 12/16/2016 23:37    Procedures Procedures  (including critical care time)  Medications Ordered in ED Medications  vancomycin (  VANCOCIN) 1,250 mg in sodium chloride 0.9 % 250 mL IVPB (not administered)  piperacillin-tazobactam (ZOSYN) IVPB 3.375 g (not administered)  sodium chloride 0.9 % bolus 2,000 mL (not administered)     Initial Impression / Assessment and Plan / ED Course  I have reviewed the triage vital signs and the nursing notes.  Pertinent labs & imaging results that were available during my care of the patient were reviewed by me and considered in my medical decision making (see chart for details).     Fever on arrival. Broad spectrum abx. LLL PNA noted on CT imaging. Not evident on plain film  Final Clinical Impressions(s) / ED Diagnoses   Final diagnoses:  Community acquired pneumonia of left lower lobe of lung ([place])    New Prescriptions New Prescriptions   No medications on file     Jola Schmidt, MD 12/16/16 2354

## 2016-12-17 ENCOUNTER — Encounter (HOSPITAL_COMMUNITY): Payer: Self-pay | Admitting: Internal Medicine

## 2016-12-17 DIAGNOSIS — D696 Thrombocytopenia, unspecified: Secondary | ICD-10-CM | POA: Diagnosis not present

## 2016-12-17 DIAGNOSIS — E1121 Type 2 diabetes mellitus with diabetic nephropathy: Secondary | ICD-10-CM | POA: Diagnosis not present

## 2016-12-17 DIAGNOSIS — Z66 Do not resuscitate: Secondary | ICD-10-CM | POA: Diagnosis not present

## 2016-12-17 DIAGNOSIS — Z79899 Other long term (current) drug therapy: Secondary | ICD-10-CM | POA: Diagnosis not present

## 2016-12-17 DIAGNOSIS — J189 Pneumonia, unspecified organism: Secondary | ICD-10-CM | POA: Diagnosis present

## 2016-12-17 DIAGNOSIS — Z96652 Presence of left artificial knee joint: Secondary | ICD-10-CM | POA: Diagnosis not present

## 2016-12-17 DIAGNOSIS — F17200 Nicotine dependence, unspecified, uncomplicated: Secondary | ICD-10-CM

## 2016-12-17 DIAGNOSIS — E118 Type 2 diabetes mellitus with unspecified complications: Secondary | ICD-10-CM

## 2016-12-17 DIAGNOSIS — Z7984 Long term (current) use of oral hypoglycemic drugs: Secondary | ICD-10-CM | POA: Diagnosis not present

## 2016-12-17 DIAGNOSIS — E7800 Pure hypercholesterolemia, unspecified: Secondary | ICD-10-CM | POA: Diagnosis not present

## 2016-12-17 DIAGNOSIS — J449 Chronic obstructive pulmonary disease, unspecified: Secondary | ICD-10-CM | POA: Diagnosis not present

## 2016-12-17 DIAGNOSIS — F17210 Nicotine dependence, cigarettes, uncomplicated: Secondary | ICD-10-CM | POA: Diagnosis not present

## 2016-12-17 DIAGNOSIS — I10 Essential (primary) hypertension: Secondary | ICD-10-CM

## 2016-12-17 DIAGNOSIS — E785 Hyperlipidemia, unspecified: Secondary | ICD-10-CM | POA: Diagnosis not present

## 2016-12-17 DIAGNOSIS — Z7982 Long term (current) use of aspirin: Secondary | ICD-10-CM | POA: Diagnosis not present

## 2016-12-17 DIAGNOSIS — J181 Lobar pneumonia, unspecified organism: Secondary | ICD-10-CM | POA: Diagnosis not present

## 2016-12-17 DIAGNOSIS — F0390 Unspecified dementia without behavioral disturbance: Secondary | ICD-10-CM | POA: Diagnosis not present

## 2016-12-17 DIAGNOSIS — F329 Major depressive disorder, single episode, unspecified: Secondary | ICD-10-CM | POA: Diagnosis not present

## 2016-12-17 DIAGNOSIS — Z833 Family history of diabetes mellitus: Secondary | ICD-10-CM | POA: Diagnosis not present

## 2016-12-17 DIAGNOSIS — E871 Hypo-osmolality and hyponatremia: Secondary | ICD-10-CM | POA: Diagnosis not present

## 2016-12-17 DIAGNOSIS — E119 Type 2 diabetes mellitus without complications: Secondary | ICD-10-CM | POA: Diagnosis not present

## 2016-12-17 LAB — CBC WITH DIFFERENTIAL/PLATELET
Basophils Absolute: 0 10*3/uL (ref 0.0–0.1)
Basophils Relative: 0 %
Eosinophils Absolute: 0 10*3/uL (ref 0.0–0.7)
Eosinophils Relative: 0 %
HCT: 36.7 % — ABNORMAL LOW (ref 39.0–52.0)
HEMOGLOBIN: 12.2 g/dL — AB (ref 13.0–17.0)
LYMPHS PCT: 18 %
Lymphs Abs: 0.8 10*3/uL (ref 0.7–4.0)
MCH: 31.9 pg (ref 26.0–34.0)
MCHC: 33.2 g/dL (ref 30.0–36.0)
MCV: 96.1 fL (ref 78.0–100.0)
Monocytes Absolute: 0.3 10*3/uL (ref 0.1–1.0)
Monocytes Relative: 7 %
Neutro Abs: 3.3 10*3/uL (ref 1.7–7.7)
Neutrophils Relative %: 74 %
Platelets: 95 10*3/uL — ABNORMAL LOW (ref 150–400)
RBC: 3.82 MIL/uL — AB (ref 4.22–5.81)
RDW: 12.7 % (ref 11.5–15.5)
WBC: 4.5 10*3/uL (ref 4.0–10.5)

## 2016-12-17 LAB — INFLUENZA PANEL BY PCR (TYPE A & B)
INFLBPCR: NEGATIVE
Influenza A By PCR: NEGATIVE

## 2016-12-17 LAB — BASIC METABOLIC PANEL
Anion gap: 9 (ref 5–15)
BUN: 12 mg/dL (ref 6–20)
CHLORIDE: 101 mmol/L (ref 101–111)
CO2: 27 mmol/L (ref 22–32)
Calcium: 8.1 mg/dL — ABNORMAL LOW (ref 8.9–10.3)
Creatinine, Ser: 0.95 mg/dL (ref 0.61–1.24)
GFR calc non Af Amer: >60 mL/min (ref >60)
Glucose, Bld: 113 mg/dL — ABNORMAL HIGH (ref 65–99)
POTASSIUM: 3.8 mmol/L (ref 3.5–5.1)
SODIUM: 137 mmol/L (ref 135–145)

## 2016-12-17 LAB — GLUCOSE, CAPILLARY
GLUCOSE-CAPILLARY: 135 mg/dL — AB (ref 65–99)
GLUCOSE-CAPILLARY: 107 mg/dL — AB (ref 65–99)
GLUCOSE-CAPILLARY: 102 mg/dL — AB (ref 65–99)
Glucose-Capillary: 104 mg/dL — ABNORMAL HIGH (ref 65–99)
Glucose-Capillary: 114 mg/dL — ABNORMAL HIGH (ref 65–99)

## 2016-12-17 LAB — PROCALCITONIN: PROCALCITONIN: 0.32 ng/mL

## 2016-12-17 LAB — STREP PNEUMONIAE URINARY ANTIGEN: Strep Pneumo Urinary Antigen: NEGATIVE

## 2016-12-17 LAB — VALPROIC ACID LEVEL: Valproic Acid Lvl: 53 ug/mL (ref 50.0–100.0)

## 2016-12-17 MED ORDER — INSULIN ASPART 100 UNIT/ML ~~LOC~~ SOLN: 0.0000 [IU] | Freq: Every day | SUBCUTANEOUS | Status: DC

## 2016-12-17 MED ORDER — MEMANTINE HCL ER 7 MG PO CP24
7.0000 mg | ORAL_CAPSULE | Freq: Every morning | ORAL | Status: DC
  Administered 2016-12-17 – 2016-12-18 (×2): 7 mg via ORAL
  Filled 2016-12-17 (×2): qty 1

## 2016-12-17 MED ORDER — TEMAZEPAM 7.5 MG PO CAPS
7.5000 mg | ORAL_CAPSULE | Freq: Every evening | ORAL | Status: DC | PRN
  Administered 2016-12-17: 7.5 mg via ORAL
  Filled 2016-12-17: qty 1

## 2016-12-17 MED ORDER — DEXTROSE 5 % IV SOLN
500.0000 mg | INTRAVENOUS | Status: DC
  Administered 2016-12-17 (×2): 500 mg via INTRAVENOUS
  Filled 2016-12-17 (×2): qty 500

## 2016-12-17 MED ORDER — POTASSIUM CHLORIDE CRYS ER 20 MEQ PO TBCR
40.0000 meq | EXTENDED_RELEASE_TABLET | Freq: Once | ORAL | Status: AC
  Administered 2016-12-17: 40 meq via ORAL
  Filled 2016-12-17: qty 2

## 2016-12-17 MED ORDER — CEFTRIAXONE SODIUM 1 G IJ SOLR
1.0000 g | INTRAMUSCULAR | Status: DC
  Administered 2016-12-17 – 2016-12-18 (×2): 1 g via INTRAVENOUS
  Filled 2016-12-17 (×2): qty 10

## 2016-12-17 MED ORDER — BUDESONIDE 0.25 MG/2ML IN SUSP
0.2500 mg | Freq: Two times a day (BID) | RESPIRATORY_TRACT | Status: DC
  Administered 2016-12-17 – 2016-12-18 (×3): 0.25 mg via RESPIRATORY_TRACT
  Filled 2016-12-17 (×3): qty 2

## 2016-12-17 MED ORDER — NICOTINE 21 MG/24HR TD PT24
21.0000 mg | MEDICATED_PATCH | Freq: Every day | TRANSDERMAL | Status: DC
  Filled 2016-12-17: qty 1

## 2016-12-17 MED ORDER — GABAPENTIN 400 MG PO CAPS
1200.0000 mg | ORAL_CAPSULE | Freq: Every day | ORAL | Status: DC
  Administered 2016-12-17 (×2): 1200 mg via ORAL
  Filled 2016-12-17 (×2): qty 3

## 2016-12-17 MED ORDER — ASPIRIN 81 MG PO CHEW
81.0000 mg | CHEWABLE_TABLET | Freq: Every day | ORAL | Status: DC
  Administered 2016-12-17 – 2016-12-18 (×2): 81 mg via ORAL
  Filled 2016-12-17 (×2): qty 1

## 2016-12-17 MED ORDER — IPRATROPIUM-ALBUTEROL 0.5-2.5 (3) MG/3ML IN SOLN
3.0000 mL | Freq: Three times a day (TID) | RESPIRATORY_TRACT | Status: DC
  Administered 2016-12-17 – 2016-12-18 (×4): 3 mL via RESPIRATORY_TRACT
  Filled 2016-12-17 (×4): qty 3

## 2016-12-17 MED ORDER — SIMVASTATIN 40 MG PO TABS
40.0000 mg | ORAL_TABLET | ORAL | Status: DC
  Administered 2016-12-17: 40 mg via ORAL
  Filled 2016-12-17: qty 1

## 2016-12-17 MED ORDER — SODIUM CHLORIDE 0.9 % IV SOLN
INTRAVENOUS | Status: DC
  Administered 2016-12-17 – 2016-12-18 (×3): via INTRAVENOUS

## 2016-12-17 MED ORDER — INSULIN ASPART 100 UNIT/ML ~~LOC~~ SOLN
0.0000 [IU] | Freq: Three times a day (TID) | SUBCUTANEOUS | Status: DC
  Administered 2016-12-17 – 2016-12-18 (×2): 2 [IU] via SUBCUTANEOUS

## 2016-12-17 MED ORDER — FLUTICASONE PROPIONATE HFA 110 MCG/ACT IN AERO: 1.0000 | INHALATION_SPRAY | Freq: Two times a day (BID) | RESPIRATORY_TRACT | Status: DC

## 2016-12-17 MED ORDER — ENOXAPARIN SODIUM 40 MG/0.4ML ~~LOC~~ SOLN
40.0000 mg | SUBCUTANEOUS | Status: DC
  Administered 2016-12-17: 40 mg via SUBCUTANEOUS
  Filled 2016-12-17: qty 0.4

## 2016-12-17 MED ORDER — HYDROCHLOROTHIAZIDE 25 MG PO TABS
25.0000 mg | ORAL_TABLET | Freq: Every day | ORAL | Status: DC
  Administered 2016-12-17: 25 mg via ORAL
  Filled 2016-12-17: qty 1

## 2016-12-17 MED ORDER — ALBUTEROL SULFATE (2.5 MG/3ML) 0.083% IN NEBU: 2.5000 mg | INHALATION_SOLUTION | RESPIRATORY_TRACT | Status: DC | PRN

## 2016-12-17 MED ORDER — GABAPENTIN 300 MG PO CAPS
300.0000 mg | ORAL_CAPSULE | Freq: Two times a day (BID) | ORAL | Status: DC
  Administered 2016-12-17 – 2016-12-18 (×2): 300 mg via ORAL
  Filled 2016-12-17 (×2): qty 1

## 2016-12-17 MED ORDER — TIZANIDINE HCL 4 MG PO TABS
4.0000 mg | ORAL_TABLET | Freq: Four times a day (QID) | ORAL | Status: DC | PRN
  Administered 2016-12-17 (×2): 4 mg via ORAL
  Filled 2016-12-17: qty 2
  Filled 2016-12-17: qty 1
  Filled 2016-12-17: qty 2
  Filled 2016-12-17 (×2): qty 1

## 2016-12-17 MED ORDER — MIRTAZAPINE 45 MG PO TABS
45.0000 mg | ORAL_TABLET | Freq: Every day | ORAL | Status: DC
  Administered 2016-12-17 (×2): 45 mg via ORAL
  Filled 2016-12-17 (×2): qty 3
  Filled 2016-12-17: qty 1

## 2016-12-17 MED ORDER — HYDROCODONE-ACETAMINOPHEN 5-325 MG PO TABS
1.0000 | ORAL_TABLET | Freq: Four times a day (QID) | ORAL | Status: DC | PRN
Start: 2016-12-17 — End: 2016-12-18
  Administered 2016-12-17: 2 via ORAL
  Administered 2016-12-17: 1 via ORAL
  Filled 2016-12-17 (×2): qty 2

## 2016-12-17 MED ORDER — BISACODYL 5 MG PO TBEC: 5.0000 mg | DELAYED_RELEASE_TABLET | Freq: Every day | ORAL | Status: DC | PRN

## 2016-12-17 MED ORDER — DONEPEZIL HCL 10 MG PO TABS
10.0000 mg | ORAL_TABLET | Freq: Every morning | ORAL | Status: DC
  Administered 2016-12-17 – 2016-12-18 (×2): 10 mg via ORAL
  Filled 2016-12-17 (×2): qty 1

## 2016-12-17 MED ORDER — IPRATROPIUM-ALBUTEROL 0.5-2.5 (3) MG/3ML IN SOLN
3.0000 mL | Freq: Four times a day (QID) | RESPIRATORY_TRACT | Status: DC
  Administered 2016-12-17: 3 mL via RESPIRATORY_TRACT
  Filled 2016-12-17: qty 3

## 2016-12-17 MED ORDER — LORAZEPAM 1 MG PO TABS
0.5000 mg | ORAL_TABLET | Freq: Every day | ORAL | Status: DC | PRN
  Filled 2016-12-17: qty 1

## 2016-12-17 MED ORDER — DIVALPROEX SODIUM ER 500 MG PO TB24
1000.0000 mg | ORAL_TABLET | Freq: Every day | ORAL | Status: DC
  Administered 2016-12-17: 1000 mg via ORAL
  Filled 2016-12-17 (×2): qty 2

## 2016-12-17 NOTE — Progress Notes (Signed)
New Admission Note: 12/17/16  Arrival Method: stretcher Mental Orientation:alert and oriented x 4 Assessment: completed Skin: 2 RNs checked. No issues  IV: RAC NSL Pain: Denies pain at this time Safety Measures: Bed in low position, bed alarm on. Safety Fall Prevention Plan has been given, discussed and signed. Admission: Completed [facility] [place] Orientation: Patient has been orientated to the room, unit and staff.  Family: Wife at bedside  Orders have been reviewed and implemented. Will continue to monitor the patient. Call light has been placed within reach and bed alarm has been activated.

## 2016-12-17 NOTE — Progress Notes (Signed)
PROGRESS NOTE    Alexander Booth [facility]  HEN:[id number] DOB: [birth year] DOA: 12/16/2016 PCP: [name], NP   Brief Narrative: 71 year old male with history of dementia, hypertension, hyperlipidemia, depression, COPD not on home oxygen presented with because of not feeling well associated with chills, sweating, coughing. He has been using inhaler without significant improvement. Also had temperature 102.6 by EMS. In the ER CT scan consistent with multilobar pneumonia. Started on antibiotics and admitted for further evaluation.  Assessment & Plan:  # Multifocal pneumonia/community acquired pneumonia: Continue ceftriaxone and azithromycin. Follow up culture results. Monitor labs. Continue bronchodilators. -Influenza negative -Pending strep antigen, cultures.  #Thrombocytopenia likely in the setting of pneumonia: No sign of bleeding. His continue Lovenox subcutaneous tenderness. Monitor CBC.  #Hyponatremia: Serum sodium level improved. Continue normal 7. Encourage oral intake.  #COPD: Continue bronchodilators and nebulization.  #Hypertension: DC hydrochlorothiazide since patient is on IV fluid. Continue to monitor blood pressure.  #Hyperlipidemia: Continue Zocor  # type 2 diabetes: Continue sliding scale. Monitor blood sugar level. Follow-up A1c level  #Dementia without behavioral issue: Resume home medication. PT OT evaluation and supportive care.  Principal Problem:   Multifocal pneumonia Active Problems:   Type 2 diabetes mellitus with complication (HCC)   Emphysema of lung (HCC)   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Dyslipidemia   Essential hypertension   Thrombocytopenia ([facility])   Tobacco dependence  DVT prophylaxis: SCD. DC Lovenox because of some cytopenia Code Status: DO NOT RESUSCITATE Family Communication: Discussed with the patient's daughter at bedside Disposition Plan: Likely discharge home with home care in 1-2 days    Consultants:   None  Procedures:  None Antimicrobials: Azithromycin and ceftriaxone since 5/28 Subjective: Seen and examined at bedside. Reported feeling weak and fatigued associated with cough. Denied shortness of breath and chest pain. No nausea vomiting. Has decreased oral intake.  Objective: Vitals:   12/17/16 0225 12/17/16 0557 12/17/16 0721 12/17/16 0956  BP: (!) 144/60 (!) 157/55  (!) 146/62  Pulse: 62   72  Resp: 20 18  18   Temp: 98.5 F (36.9 C) 98.7 F (37.1 C)  98.7 F (37.1 C)  TempSrc: Oral Oral  Oral  SpO2: 92% 97%  94%  Weight: 79.6 kg (175 lb 8 oz)     Height:   6' (1.829 m)     Intake/Output Summary (Last 24 hours) at 12/17/16 1431 Last data filed at 12/17/16 1400  Gross per 24 hour  Intake          2871.25 ml  Output              700 ml  Net          2171.25 ml   Filed Weights   12/17/16 0225  Weight: 79.6 kg (175 lb 8 oz)    Examination:  General exam: Elderly male likely in bed comfortable, not in distress Respiratory system: Bibasal crackles, no wheezing. Respiratory effort normal..  Cardiovascular system: S1 & S2 heard, RRR.  No pedal edema. Gastrointestinal system: Abdomen is nondistended, soft and nontender. Normal bowel sounds heard. Central nervous system: Alert awake and following commands Extremities: Symmetric 5 x 5 power. Skin: No rashes, lesions or ulcers Psychiatry: Judgement and insight appear impaired  Data Reviewed: I have personally reviewed following labs and imaging studies  CBC:  Recent Labs Lab 12/16/16 2140 12/17/16 0455  WBC 5.6 4.5  NEUTROABS 4.8 3.3  HGB 13.4 12.2*  HCT 40.0 36.7*  MCV 96.4 96.1  PLT 120* 95*  Basic Metabolic Panel:  Recent Labs Lab 12/16/16 2140 12/17/16 0455  NA 130* 137  K 3.4* 3.8  CL 92* 101  CO2 27 27  GLUCOSE 174* 113*  BUN 15 12  CREATININE 1.16 0.95  CALCIUM 8.8* 8.1*   GFR: Estimated Creatinine Clearance: 76 mL/min (by C-G formula based on SCr of 0.95 mg/dL). Liver Function Tests:  Recent Labs Lab  12/16/16 2140  AST 21  ALT 9*  ALKPHOS 51  BILITOT 0.7  PROT 7.1  ALBUMIN 3.8   No results for input(s): LIPASE, AMYLASE in the last 168 hours. No results for input(s): AMMONIA in the last 168 hours. Coagulation Profile: No results for input(s): INR, PROTIME in the last 168 hours. Cardiac Enzymes: No results for input(s): CKTOTAL, CKMB, CKMBINDEX, TROPONINI in the last 168 hours. BNP (last 3 results) No results for input(s): PROBNP in the last 8760 hours. HbA1C: No results for input(s): HGBA1C in the last 72 hours. CBG:  Recent Labs Lab 12/17/16 0149 12/17/16 0820 12/17/16 1207  GLUCAP 104* 135* 114*   Lipid Profile: No results for input(s): CHOL, HDL, LDLCALC, TRIG, CHOLHDL, LDLDIRECT in the last 72 hours. Thyroid Function Tests: No results for input(s): TSH, T4TOTAL, FREET4, T3FREE, THYROIDAB in the last 72 hours. Anemia Panel: No results for input(s): VITAMINB12, FOLATE, FERRITIN, TIBC, IRON, RETICCTPCT in the last 72 hours. Sepsis Labs:  Recent Labs Lab 12/16/16 2157 12/17/16 0455  PROCALCITON  --  0.32  LATICACIDVEN 1.52  --     Recent Results (from the past 240 hour(s))  Blood culture (routine x 2)     Status: None (Preliminary result)   Collection Time: 12/16/16  9:40 PM  Result Value Ref Range Status   Specimen Description BLOOD RIGHT ANTECUBITAL  Final   Special Requests   Final    BOTTLES DRAWN AEROBIC AND ANAEROBIC Blood Culture adequate volume   Culture NO GROWTH < 24 HOURS  Final   Report Status PENDING  Incomplete  Blood culture (routine x 2)     Status: None (Preliminary result)   Collection Time: 12/16/16 10:20 PM  Result Value Ref Range Status   Specimen Description BLOOD LEFT ANTECUBITAL  Final   Special Requests   Final    BOTTLES DRAWN AEROBIC AND ANAEROBIC Blood Culture adequate volume   Culture NO GROWTH < 24 HOURS  Final   Report Status PENDING  Incomplete         Radiology Studies: Dg Chest 2 View  Result Date:  12/16/2016 CLINICAL DATA:  Fever. EXAM: CHEST  2 VIEW COMPARISON:  March 12, 2016 FINDINGS: The heart size and mediastinal contours are within normal limits. Both lungs are clear. The visualized skeletal structures are unremarkable. IMPRESSION: No active cardiopulmonary disease. Electronically Signed   By: Dorise Bullion III M.D   On: 12/16/2016 21:32   Ct Chest Wo Contrast  Result Date: 12/16/2016 CLINICAL DATA:  Fever and cough, tobacco use. EXAM: CT CHEST WITHOUT CONTRAST TECHNIQUE: Multidetector CT imaging of the chest was performed following the standard protocol without IV contrast. COMPARISON:  None. FINDINGS: Cardiovascular: The normal size cardiac chambers without pericardial effusion. Coronary arteriosclerosis is noted. There is aortic atherosclerosis as well without aneurysm. Mediastinum/Nodes: Small mediastinal lymph nodes are noted. Findings are likely reactive in etiology. The hilum is difficult to assess given lack of IV contrast. The trachea, mainstem bronchi and esophagus are unremarkable. Thyroid gland is normal in size without nodularity. Lungs/Pleura: Bilateral bronchiectasis especially to the lower lobes left greater than right.  Patchy airspace opacities in the left lower lobe concerning for pneumonia. More faint ground-glass opacities are seen in the right middle lobe, lingula and superior segment of left lower lobe consistent with alveolitis or pneumonitis. Upper Abdomen: No acute abnormality. Musculoskeletal: Neural stimulating device is seen along the mid thoracic spine with evidence of upper lumbar spinal fusion hardware and kyphoplasty. IMPRESSION: 1. Bilateral bronchiectasis more so within the left lower lobe. 2. Patchy airspace opacities in the left lower lobe concerning for pneumonia with more faint ground-glass opacities in the right middle lobe and lingula as well superior segment left lower lobe. 3. Coronary arteriosclerosis and aortic atherosclerosis. Electronically Signed    By: Ashley Royalty M.D.   On: 12/16/2016 23:37        Scheduled Meds: . aspirin  81 mg Oral Daily  . budesonide (PULMICORT) nebulizer solution  0.25 mg Nebulization BID  . divalproex  1,000 mg Oral Daily  . donepezil  10 mg Oral q morning - 10a  . enoxaparin (LOVENOX) injection  40 mg Subcutaneous Q24H  . gabapentin  1,200 mg Oral QHS  . gabapentin  300 mg Oral BID  . hydrochlorothiazide  25 mg Oral Daily  . insulin aspart  0-15 Units Subcutaneous TID WC  . insulin aspart  0-5 Units Subcutaneous QHS  . ipratropium-albuterol  3 mL Nebulization TID  . memantine  7 mg Oral q morning - 10a  . mirtazapine  45 mg Oral QHS  . nicotine  21 mg Transdermal Daily  . simvastatin  40 mg Oral QODAY   Continuous Infusions: . sodium chloride 75 mL/hr at 12/17/16 0249  . azithromycin Stopped (12/17/16 0340)  . cefTRIAXone (ROCEPHIN)  IV Stopped (12/17/16 0735)     LOS: 0 days    Emanuell Morina Tanna Furry, MD [facility] Pager [phone]  If 7PM-7AM, please contact night-coverage [email or web address] Password [facility] 12/17/2016, 2:31 PM

## 2016-12-18 ENCOUNTER — Telehealth: Payer: Self-pay | Admitting: *Deleted

## 2016-12-18 DIAGNOSIS — E118 Type 2 diabetes mellitus with unspecified complications: Secondary | ICD-10-CM | POA: Diagnosis not present

## 2016-12-18 DIAGNOSIS — F329 Major depressive disorder, single episode, unspecified: Secondary | ICD-10-CM | POA: Diagnosis not present

## 2016-12-18 DIAGNOSIS — E871 Hypo-osmolality and hyponatremia: Secondary | ICD-10-CM | POA: Diagnosis not present

## 2016-12-18 DIAGNOSIS — J181 Lobar pneumonia, unspecified organism: Secondary | ICD-10-CM | POA: Diagnosis not present

## 2016-12-18 DIAGNOSIS — E7800 Pure hypercholesterolemia, unspecified: Secondary | ICD-10-CM | POA: Diagnosis not present

## 2016-12-18 DIAGNOSIS — E119 Type 2 diabetes mellitus without complications: Secondary | ICD-10-CM | POA: Diagnosis not present

## 2016-12-18 DIAGNOSIS — D696 Thrombocytopenia, unspecified: Secondary | ICD-10-CM | POA: Diagnosis not present

## 2016-12-18 DIAGNOSIS — F0390 Unspecified dementia without behavioral disturbance: Secondary | ICD-10-CM | POA: Diagnosis not present

## 2016-12-18 DIAGNOSIS — J189 Pneumonia, unspecified organism: Secondary | ICD-10-CM | POA: Diagnosis not present

## 2016-12-18 DIAGNOSIS — E785 Hyperlipidemia, unspecified: Secondary | ICD-10-CM | POA: Diagnosis not present

## 2016-12-18 DIAGNOSIS — I10 Essential (primary) hypertension: Secondary | ICD-10-CM | POA: Diagnosis not present

## 2016-12-18 DIAGNOSIS — F17210 Nicotine dependence, cigarettes, uncomplicated: Secondary | ICD-10-CM | POA: Diagnosis not present

## 2016-12-18 LAB — CBC
HCT: 35.8 % — ABNORMAL LOW (ref 39.0–52.0)
Hemoglobin: 12 g/dL — ABNORMAL LOW (ref 13.0–17.0)
MCH: 32.2 pg (ref 26.0–34.0)
MCHC: 33.5 g/dL (ref 30.0–36.0)
MCV: 96 fL (ref 78.0–100.0)
Platelets: 92 10*3/uL — ABNORMAL LOW (ref 150–400)
RBC: 3.73 MIL/uL — ABNORMAL LOW (ref 4.22–5.81)
RDW: 12.9 % (ref 11.5–15.5)
WBC: 5.3 10*3/uL (ref 4.0–10.5)

## 2016-12-18 LAB — HEMOGLOBIN A1C
Hgb A1c MFr Bld: 6.1 % — ABNORMAL HIGH (ref 4.8–5.6)
MEAN PLASMA GLUCOSE: 128 mg/dL

## 2016-12-18 LAB — GLUCOSE, CAPILLARY: GLUCOSE-CAPILLARY: 127 mg/dL — AB (ref 65–99)

## 2016-12-18 MED ORDER — DIVALPROEX SODIUM ER 500 MG PO TB24: 500.0000 mg | ORAL_TABLET | Freq: Two times a day (BID) | ORAL | Status: DC

## 2016-12-18 MED ORDER — LEVOFLOXACIN 750 MG PO TABS: 750.0000 mg | ORAL_TABLET | Freq: Every day | ORAL | 0 refills | Status: AC

## 2016-12-18 NOTE — Care Management Obs Status (Signed)
[facility] NOTIFICATION   Patient Details  Name: RANFERI CLINGAN MRN: [id number] Date of Birth: [birth year]   Medicare Observation Status Notification Given:  Yes    Agnes Brightbill, Rory Percy, RN 12/18/2016, 10:32 AM

## 2016-12-18 NOTE — Progress Notes (Signed)
Discharge instructions and medications discussed with patient and daughter.  Prescription given to daughter,.  All questions answered.

## 2016-12-18 NOTE — Care Management Note (Signed)
Case Management Note  Patient Details  Name: Alexander Booth MRN: [id number] Date of Birth: 16-May-1946  Subjective/Objective:              CM following for progression and d/c planning.      Action/Plan: 12/18/2016 Met with pt and daughter, no HH or DME needs identified. Pt for d/c today.   Expected Discharge Date:  12/18/16               Expected Discharge Plan:  Home/Self Care  In-House Referral:  NA  Discharge planning Services  NA  Post Acute Care Choice:  NA Choice offered to:  NA  DME Arranged:  N/A DME Agency:  NA  HH Arranged:  NA HH Agency:  NA  Status of Service:  Completed, signed off  If discussed at [place] of Stay Meetings, dates discussed:    Additional Comments:  Adron Bene, RN 12/18/2016, 10:40 AM

## 2016-12-18 NOTE — Discharge Summary (Signed)
Physician Discharge Summary  Alexander Booth [facility] CNO:[id number] DOB: 1945-10-03 DOA: 12/16/2016  PCP: [name], NP  Admit date: 12/16/2016 Discharge date: 12/18/2016  Admitted From:home Disposition:home  Recommendations for Outpatient Follow-up:  1. Follow up with PCP in 1-2 weeks 2. Please obtain BMP/CBC in one week  Home Health:no, family decline Equipment/Devices:no Discharge Condition:stable CODE STATUS:full Diet recommendation:carb modified heart healthy  Brief/Interim Summary: 71 year old male with history of dementia, hypertension, hyperlipidemia, depression, COPD not on home oxygen presented with because of not feeling well associated with chills, sweating, coughing. He has been using inhaler without significant improvement. Also had temperature 102.6 by EMS. In the ER CT scan consistent with multilobar pneumonia. Started on antibiotics and admitted for further evaluation.  # Multifocal pneumonia/community acquired pneumonia: Treated with ceftriaxone and azithromycin. Clinically improved. Patient wanted to go home today and does not want to stay in the hospital for further treatment. He is not hypoxic and reported that her shortness of breath and cough is better. He has inhaler at home. Discussed with the patient and daughter at bedside. Daughter takes care of the patient very well. Plan to discharge with oral Levaquin for 7 days and follow-up with PCP. Recommended to follow up culture results with PCP. They verbalized understanding.  -Influenza negative - strep antigen negative.  #Thrombocytopenia likely in the setting of pneumonia: No sign of bleeding. Discussed with the patient and daughter. Recommended to repeat CBC next week with PCP.  #Hyponatremia: Serum sodium level improved. Encourage oral intake.  #COPD: Continue bronchodilators and nebulization.  #Hypertension: Resume home medication.  #Hyperlipidemia: Continue Zocor  # type 2 diabetes: Resume home  medication. Follow up with PCP.  #Dementia without behavioral issue: Supportive care. Patient's daughter reported that she takes care of her father and does not need home Services at this time. She knows that he will follow up with PCP if needed.  Marland Kitchen Discharge Diagnoses:  Principal Problem:   Multifocal pneumonia Active Problems:   Type 2 diabetes mellitus with complication (HCC)   Emphysema of lung (HCC)   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Dyslipidemia   Essential hypertension   Thrombocytopenia (HCC)   Tobacco dependence    Discharge Instructions  Discharge Instructions    Call MD for:  difficulty breathing, headache or visual disturbances    Complete by:  As directed    Call MD for:  extreme fatigue    Complete by:  As directed    Call MD for:  hives    Complete by:  As directed    Call MD for:  persistant dizziness or light-headedness    Complete by:  As directed    Call MD for:  persistant nausea and vomiting    Complete by:  As directed    Call MD for:  severe uncontrolled pain    Complete by:  As directed    Call MD for:  temperature >100.4    Complete by:  As directed    Diet - low sodium heart healthy    Complete by:  As directed    Diet Carb Modified    Complete by:  As directed    Discharge instructions    Complete by:  As directed    Please follow up with your PCP in 1 week. Recheck CBC in one week. Take yogurt or probiotics while on antibiotics. Follow-up blood culture results with  PCP.   Increase activity slowly    Complete by:  As directed  Allergies as of 12/18/2016      Reactions   Codeine Hives, Itching      Medication List    TAKE these medications   aspirin 81 MG chewable tablet Chew 81 mg by mouth daily.   bisacodyl 5 MG EC tablet Commonly known as:  DULCOLAX Take 5 mg by mouth daily as needed for mild constipation.   divalproex 500 MG 24 hr tablet Commonly known as:  DEPAKOTE ER Take 1 tablet (500 mg total) by mouth 2  (two) times daily.   donepezil 10 MG tablet Commonly known as:  ARICEPT Take 1 tablet (10 mg total) by mouth every morning.   fluticasone 110 MCG/ACT inhaler Commonly known as:  FLOVENT HFA Inhale 1 puff into the lungs 2 (two) times daily. What changed:  when to take this  reasons to take this   gabapentin 400 MG capsule Commonly known as:  NEURONTIN Take 1,200 mg by mouth at bedtime.   gabapentin 300 MG capsule Commonly known as:  NEURONTIN TAKE 1 CAPSULE TWICE DAILY   hydrochlorothiazide 25 MG tablet Commonly known as:  HYDRODIURIL TAKE 2 TABLETS (50 MG TOTAL)  DAILY. What changed:  See the new instructions.   Hydrocodone-Acetaminophen 7.5-300 MG Tabs Commonly known as:  VICODIN ES Take 1 tablet by mouth every 8 (eight) hours as needed. What changed:  when to take this  additional instructions   levofloxacin 750 MG tablet Commonly known as:  LEVAQUIN Take 1 tablet (750 mg total) by mouth daily.   LORazepam 0.5 MG tablet Commonly known as:  ATIVAN Take 1 tablet (0.5 mg total) by mouth daily as needed for anxiety. What changed:  when to take this  additional instructions   memantine 7 MG Cp24 24 hr capsule Commonly known as:  NAMENDA XR Take 1 capsule (7 mg total) by mouth every morning.   metFORMIN 500 MG tablet Commonly known as:  GLUCOPHAGE TAKE 1 TABLET TWICE DAILY   mirtazapine 45 MG tablet Commonly known as:  REMERON Take 1 tablet (45 mg total) by mouth at bedtime.   nitroGLYCERIN 0.4 MG SL tablet Commonly known as:  NITROSTAT Place 0.4 mg under the tongue every 5 (five) minutes as needed for chest pain.   NONFORMULARY OR COMPOUNDED [place]:  Peripheral Neuropathy cream - Bupivacaine 1%, Doxepin 3%, Gabapentin 6%, Pentoxifylline 3%, Topiramate 1%, apply to affected area 1-2 grams 3-4 times daily What changed:  how much to take  how to take this  when to take this  reasons to take this  additional instructions    simvastatin 40 MG tablet Commonly known as:  ZOCOR Take 1 tablet (40 mg total) by mouth daily at 6 PM. What changed:  when to take this  additional instructions   tiZANidine 4 MG tablet Commonly known as:  ZANAFLEX Take 4 mg by mouth 4 (four) times daily.   VENTOLIN HFA 108 (90 Base) MCG/ACT inhaler Generic drug:  albuterol INHALE 2 PUFFS INTO THE LUNGS EVERY 6 (SIX) HOURS AS NEEDED FOR WHEEZING OR SHORTNESS OF BREATH.      Follow-up Information    [name], NP. Schedule an appointment as soon as possible for a visit in 1 week(s).   Specialty:  Internal Medicine Contact information: [place] [phone] [phone]          Allergies  Allergen Reactions  . Codeine Hives and Itching    Consultations: None  Procedures/Studies: None  Subjective: Seen and examined  at bedside. Wanted to go home today. Reported that shortness of breath and cough is better. Lanoxin. Denied chest pain, nausea or vomiting. Daughter at bedside.  Discharge Exam: Vitals:   12/18/16 0552 12/18/16 0934  BP: (!) 153/63 (!) 146/68  Pulse: 78 77  Resp: 16 17  Temp: 98 F (36.7 C) 98.5 F (36.9 C)   Vitals:   12/17/16 1806 12/17/16 2048 12/18/16 0552 12/18/16 0934  BP: 138/60 (!) 158/67 (!) 153/63 (!) 146/68  Pulse: 68 84 78 77  Resp: 16 16 16 17   Temp: 99.2 F (37.3 C) 99.9 F (37.7 C) 98 F (36.7 C) 98.5 F (36.9 C)  TempSrc: Oral Oral Oral Oral  SpO2: 98% 97% 98% 90%  Weight:  81.7 kg (180 lb 1.6 oz)    Height:        General: Pt is alert, awake, not in acute distress Cardiovascular: RRR, S1/S2 +, no rubs, no gallops Respiratory: Bibasal mild crackle, better than yesterday, no wheezing. Abdominal: Soft, NT, ND, bowel sounds + Extremities: no edema, no cyanosis Neurology: Alert, awake, following commands, muscle strength 5 over 5 in all extremities.   The results of significant diagnostics from this hospitalization (including imaging,  microbiology, ancillary and laboratory) are listed below for reference.     Microbiology: Recent Results (from the past 240 hour(s))  Blood culture (routine x 2)     Status: None (Preliminary result)   Collection Time: 12/16/16  9:40 PM  Result Value Ref Range Status   Specimen Description BLOOD RIGHT ANTECUBITAL  Final   Special Requests   Final    BOTTLES DRAWN AEROBIC AND ANAEROBIC Blood Culture adequate volume   Culture NO GROWTH < 24 HOURS  Final   Report Status PENDING  Incomplete  Blood culture (routine x 2)     Status: None (Preliminary result)   Collection Time: 12/16/16 10:20 PM  Result Value Ref Range Status   Specimen Description BLOOD LEFT ANTECUBITAL  Final   Special Requests   Final    BOTTLES DRAWN AEROBIC AND ANAEROBIC Blood Culture adequate volume   Culture NO GROWTH < 24 HOURS  Final   Report Status PENDING  Incomplete     Labs: BNP (last 3 results) No results for input(s): BNP in the last 8760 hours. Basic Metabolic Panel:  Recent Labs Lab 12/16/16 2140 12/17/16 0455  NA 130* 137  K 3.4* 3.8  CL 92* 101  CO2 27 27  GLUCOSE 174* 113*  BUN 15 12  CREATININE 1.16 0.95  CALCIUM 8.8* 8.1*   Liver Function Tests:  Recent Labs Lab 12/16/16 2140  AST 21  ALT 9*  ALKPHOS 51  BILITOT 0.7  PROT 7.1  ALBUMIN 3.8   No results for input(s): LIPASE, AMYLASE in the last 168 hours. No results for input(s): AMMONIA in the last 168 hours. CBC:  Recent Labs Lab 12/16/16 2140 12/17/16 0455 12/18/16 0342  WBC 5.6 4.5 5.3  NEUTROABS 4.8 3.3  --   HGB 13.4 12.2* 12.0*  HCT 40.0 36.7* 35.8*  MCV 96.4 96.1 96.0  PLT 120* 95* 92*   Cardiac Enzymes: No results for input(s): CKTOTAL, CKMB, CKMBINDEX, TROPONINI in the last 168 hours. BNP: Invalid input(s): POCBNP CBG:  Recent Labs Lab 12/17/16 0820 12/17/16 1207 12/17/16 1707 12/17/16 2053 12/18/16 0744  GLUCAP 135* 114* 107* 102* 127*   D-Dimer No results for input(s): DDIMER in the last 72  hours. Hgb A1c  Recent Labs  12/17/16 0455  HGBA1C 6.1*  Lipid Profile No results for input(s): CHOL, HDL, LDLCALC, TRIG, CHOLHDL, LDLDIRECT in the last 72 hours. Thyroid function studies No results for input(s): TSH, T4TOTAL, T3FREE, THYROIDAB in the last 72 hours.  Invalid input(s): FREET3 Anemia work up No results for input(s): VITAMINB12, FOLATE, FERRITIN, TIBC, IRON, RETICCTPCT in the last 72 hours. Urinalysis    Component Value Date/Time   COLORURINE YELLOW 12/16/2016 2343   APPEARANCEUR CLEAR 12/16/2016 2343   LABSPEC 1.013 12/16/2016 2343   PHURINE 6.0 12/16/2016 2343   GLUCOSEU NEGATIVE 12/16/2016 2343   HGBUR NEGATIVE 12/16/2016 2343   BILIRUBINUR NEGATIVE 12/16/2016 2343   BILIRUBINUR neg 02/07/2014 1623   KETONESUR 5 (A) 12/16/2016 2343   PROTEINUR NEGATIVE 12/16/2016 2343   UROBILINOGEN 0.2 02/07/2014 1623   NITRITE NEGATIVE 12/16/2016 2343   LEUKOCYTESUR NEGATIVE 12/16/2016 2343   Sepsis Labs Invalid input(s): PROCALCITONIN,  WBC,  LACTICIDVEN Microbiology Recent Results (from the past 240 hour(s))  Blood culture (routine x 2)     Status: None (Preliminary result)   Collection Time: 12/16/16  9:40 PM  Result Value Ref Range Status   Specimen Description BLOOD RIGHT ANTECUBITAL  Final   Special Requests   Final    BOTTLES DRAWN AEROBIC AND ANAEROBIC Blood Culture adequate volume   Culture NO GROWTH < 24 HOURS  Final   Report Status PENDING  Incomplete  Blood culture (routine x 2)     Status: None (Preliminary result)   Collection Time: 12/16/16 10:20 PM  Result Value Ref Range Status   Specimen Description BLOOD LEFT ANTECUBITAL  Final   Special Requests   Final    BOTTLES DRAWN AEROBIC AND ANAEROBIC Blood Culture adequate volume   Culture NO GROWTH < 24 HOURS  Final   Report Status PENDING  Incomplete     Time coordinating discharge: 32 minutes  SIGNED:   Rosita Fire, MD  [facility] 12/18/2016, 9:48 AM  If 7PM-7AM, please  contact night-coverage [email or web address] Password [email or web address]

## 2016-12-18 NOTE — Progress Notes (Signed)
Patient's daughter refusing Depakote this morning. She said he will get it when he gets home.

## 2016-12-18 NOTE — Consult Note (Signed)
           [facility] CM Primary Care Navigator  12/18/2016  [place] 04-Jan-1946 [id number]   Went to see patientat the bedsideearlier today to identify possible discharge needs but he was alreadydischarged.  Patient was discharged home per staff report.  Noted that primary care provider's office Earl Lagos) had attempted a transition of care call to confirm post hospital follow-up appointment this afternoon.   For questions, please contact:  Dannielle Huh, BSN, RN- [facility] Primary Care Navigator  Telephone: [phone] [place]

## 2016-12-18 NOTE — Care Management CC44 (Signed)
Condition Code 44 Documentation Completed  Patient Details  Name: Alexander Booth MRN: [id number] Date of Birth: Sep 16, 1945   Condition Code 44 given:   yes Patient signature on Condition Code 44 notice:   yes Documentation of 2 MD's agreement:   yes Code 44 added to claim:   yes    Adron Bene, RN 12/18/2016, 10:31 AM

## 2016-12-18 NOTE — Care Management Note (Signed)
Case Management Note  Patient Details  Name: Alexander Booth MRN: [id number] Date of Birth: 1945-08-22  Subjective/Objective:                    Action/Plan:   Expected Discharge Date:  12/18/16               Expected Discharge Plan:  Home/Self Care  In-House Referral:  NA  Discharge planning Services  NA  Post Acute Care Choice:  NA Choice offered to:  NA  DME Arranged:  N/A DME Agency:  NA  HH Arranged:  NA HH Agency:  NA  Status of Service:  Completed, signed off  If discussed at [place] of Stay Meetings, dates discussed:    Additional Comments:  Adron Bene, RN 12/18/2016, 10:39 AM

## 2016-12-18 NOTE — Telephone Encounter (Signed)
Lm requesting return call to complete TCM and confirm hosp f/u appt  

## 2016-12-19 NOTE — Telephone Encounter (Signed)
Transition Care Management Follow-up Telephone Call  TCM completed by pts daughter [name]    Date discharged?12/18/2016   How have you been since you were released from the hospital? Daughter reports pt has "been a little better, but hasnt eaten much"   Do you understand why you were in the hospital? yes   Do you understand the discharge instructions? yes   Where were you discharged to? home   Items Reviewed:  Medications reviewed: yes  Allergies reviewed: yes  Dietary changes reviewed: yes  Referrals reviewed: yes   Functional Questionnaire:   Activities of Daily Living (ADLs):   Daughter and Wife assist with any ADL needs, if any    Any transportation issues/concerns?: no   Any patient concerns? no   Confirmed importance and date/time of follow-up visits scheduled yes  Provider Appointment booked with [facility] 6/4  Confirmed with patient if condition begins to worsen call PCP or go to the ER.  Patient was given the office number and encouraged to call back with question or concerns.  : yes

## 2016-12-21 LAB — CULTURE, BLOOD (ROUTINE X 2)
CULTURE: NO GROWTH
Culture: NO GROWTH
Special Requests: ADEQUATE
Special Requests: ADEQUATE

## 2016-12-23 ENCOUNTER — Ambulatory Visit (INDEPENDENT_AMBULATORY_CARE_PROVIDER_SITE_OTHER): Payer: Medicare HMO | Admitting: Internal Medicine

## 2016-12-23 ENCOUNTER — Encounter: Payer: Self-pay | Admitting: Internal Medicine

## 2016-12-23 VITALS — BP 106/64 | HR 78 | Temp 97.6°F | Wt 171.5 lb

## 2016-12-23 DIAGNOSIS — J189 Pneumonia, unspecified organism: Secondary | ICD-10-CM

## 2016-12-23 DIAGNOSIS — J181 Lobar pneumonia, unspecified organism: Secondary | ICD-10-CM

## 2016-12-23 MED ORDER — IPRATROPIUM BROMIDE 0.02 % IN SOLN
0.5000 mg | Freq: Once | RESPIRATORY_TRACT | Status: AC
  Administered 2016-12-23: 0.5 mg via RESPIRATORY_TRACT

## 2016-12-23 MED ORDER — ALBUTEROL SULFATE (2.5 MG/3ML) 0.083% IN NEBU
2.5000 mg | INHALATION_SOLUTION | Freq: Once | RESPIRATORY_TRACT | Status: AC
  Administered 2016-12-23: 2.5 mg via RESPIRATORY_TRACT

## 2016-12-23 NOTE — Patient Instructions (Addendum)

## 2016-12-23 NOTE — Addendum Note (Signed)
Addended by: Lurlean Nanny on: 12/23/2016 11:45 AM   Modules accepted: Orders

## 2016-12-23 NOTE — Progress Notes (Signed)
Subjective:    Patient ID: Alexander Booth, male    DOB: 1945/09/08, 71 y.o.   MRN: [id number]  HPI  Pt presents to the clinic today for [facility] Followup. He went to the ER with c/o fever, chills and coughing. CT chest revealed multilobar pneumonia. He was treated with IV antibiotics and transitioned to oral Levquin. He has been taking the antibiotics as prescribed. Since discharge, he reports he continues to feel very fatigued. He does not have an appetite and the antibiotic makes him nauseated. He denies vomiting or loose stools.    Review of Systems  Past Medical History:  Diagnosis Date  . Anginal pain ([place])   . Bronchitis    Subacute and hx of ongoing cigarette smoking one half pack daily  . COPD (chronic obstructive pulmonary disease) ([place])   . Dementia    He has a hx of the   . Depression   . Erectile dysfunction    He's had a hx of   . FHx: heart disease    Positive for   . Fracture of lumbar vertebra ([place]) 11/25/2012  . Hard of hearing    very little hearing on left side;better in right ear  . Hypercholesterolemia   . Hypertension    Essential  . Lumbosacral disc disease    with chronic low back pain, has implanted nerve stimulator  . Peripheral neuropathy   . Presenile dementia   . Type 2 diabetes mellitus ([place]) 2010   takes oral meds    Current Outpatient Prescriptions  Medication Sig Dispense Refill  . aspirin 81 MG chewable tablet Chew 81 mg by mouth daily.     . bisacodyl (DULCOLAX) 5 MG EC tablet Take 5 mg by mouth daily as needed for mild constipation.     . divalproex (DEPAKOTE ER) 500 MG 24 hr tablet Take 1 tablet (500 mg total) by mouth 2 (two) times daily.    Marland Kitchen donepezil (ARICEPT) 10 MG tablet Take 1 tablet (10 mg total) by mouth every morning. 90 tablet 3  . fluticasone (FLOVENT HFA) 110 MCG/ACT inhaler Inhale 1 puff into the lungs 2 (two) times daily. (Patient taking differently: Inhale 1 puff into the lungs 2 (two) times daily as needed  (shortness of breath). ) 1 Inhaler 12  . gabapentin (NEURONTIN) 300 MG capsule TAKE 1 CAPSULE TWICE DAILY 180 capsule 3  . gabapentin (NEURONTIN) 400 MG capsule Take 1,200 mg by mouth at bedtime.    . hydrochlorothiazide (HYDRODIURIL) 25 MG tablet TAKE 2 TABLETS (50 MG TOTAL)  DAILY. (Patient taking differently: TAKE 1 TABLET TWICE DAILY) 180 tablet 1  . Hydrocodone-Acetaminophen (VICODIN ES) 7.5-300 MG TABS Take 1 tablet by mouth every 8 (eight) hours as needed. (Patient taking differently: Take 1 tablet by mouth See admin instructions. Take 1 tablet midday and 1 tablet at bedtime. May take in between as needed for pain) 30 each 0  . levofloxacin (LEVAQUIN) 750 MG tablet Take 1 tablet (750 mg total) by mouth daily. 7 tablet 0  . LORazepam (ATIVAN) 0.5 MG tablet Take 1 tablet (0.5 mg total) by mouth daily as needed for anxiety. (Patient taking differently: Take 0.5 mg by mouth See admin instructions. Thursday, Saturday and Sunday.) 20 tablet 2  . memantine (NAMENDA XR) 7 MG CP24 24 hr capsule Take 1 capsule (7 mg total) by mouth every morning. 90 capsule 1  . metFORMIN (GLUCOPHAGE) 500 MG tablet TAKE 1 TABLET TWICE DAILY 180 tablet 1  . mirtazapine (  REMERON) 45 MG tablet Take 1 tablet (45 mg total) by mouth at bedtime. 90 tablet 1  . nitroGLYCERIN (NITROSTAT) 0.4 MG SL tablet Place 0.4 mg under the tongue every 5 (five) minutes as needed for chest pain.     . NONFORMULARY OR COMPOUNDED ITEM [facility]:  Peripheral Neuropathy cream - Bupivacaine 1%, Doxepin 3%, Gabapentin 6%, Pentoxifylline 3%, Topiramate 1%, apply to affected area 1-2 grams 3-4 times daily (Patient taking differently: Apply 1 application topically daily as needed (PAIN). [facility]:  Peripheral Neuropathy cream - Bupivacaine 1%, Doxepin 3%, Gabapentin 6%, Pentoxifylline 3%, Topiramate 1%, apply to affected area 1-2 grams 3-4 times daily) 120 each 3  . simvastatin (ZOCOR) 40 MG tablet Take 1 tablet (40 mg total) by mouth  daily at 6 PM. (Patient taking differently: Take 40 mg by mouth 3 (three) times a week. Monday, wedneday and Friday) 30 tablet 6  . tiZANidine (ZANAFLEX) 4 MG tablet Take 4 mg by mouth 4 (four) times daily.     . VENTOLIN HFA 108 (90 Base) MCG/ACT inhaler INHALE 2 PUFFS INTO THE LUNGS EVERY 6 (SIX) HOURS AS NEEDED FOR WHEEZING OR SHORTNESS OF BREATH. 18 Inhaler 0   No current facility-administered medications for this visit.     Allergies  Allergen Reactions  . Codeine Hives and Itching    Family History  Problem Relation Age of Onset  . Multiple myeloma Mother   . Diabetes Mother   . Cancer Sister        lymphoma  . Diabetes Sister   . Stroke Neg Hx     Social History   Social History  . Marital status: Married    Spouse name: Vaughan Basta  . Number of children: 3  . Years of education: 12   Occupational History  . retired     retired   Social History Main Topics  . Smoking status: Current Every Day Smoker    Packs/day: 1.00    Years: 40.00  . Smokeless tobacco: Never Used  . Alcohol use No  . Drug use: No  . Sexual activity: Not on file   Other Topics Concern  . Not on file   Social History Narrative   Patient is right handed   Patient resides with wife,consumes 2-3 cups of caffeine daily     Constitutional: Pt reports fatigue.  Denies fever, malaise, headache or abrupt weight changes.  HEENT: Denies eye pain, eye redness, ear pain, ringing in the ears, wax buildup, runny nose, nasal congestion, bloody nose, or sore throat. Respiratory: Pt reports cough. Denies difficulty breathing, shortness of breath, cough or sputum production.   Gastrointestinal: Pt reports nausea. Denies abdominal pain, bloating, constipation, diarrhea or blood in the stool.  Musculoskeletal: Pt reports weakness. Denies decrease in range of motion, difficulty with gait, muscle pain or joint pain and swelling.    No other specific complaints in a complete review of systems (except as listed  in HPI above).     Objective:   Physical Exam   BP 106/64   Pulse 78   Temp 97.6 F (36.4 C) (Oral)   Wt 171 lb 8 oz (77.8 kg)   SpO2 92%   BMI 23.26 kg/m  Wt Readings from Last 3 Encounters:  12/23/16 171 lb 8 oz (77.8 kg)  12/17/16 180 lb 1.6 oz (81.7 kg)  11/04/16 184 lb 12 oz (83.8 kg)    General: Appears his stated age, chronically ill appearin in NAD. Neck:  No lymphadenopathy noted.  Cardiovascular: Normal rate and rhythm. S1,S2 noted.  No murmur, rubs or gallops noted.  Pulmonary/Chest: Normal effort and Scattered rhonchi noted mostly in RML. No respiratory distress. No wheezes, rales noted.  Musculoskeletal: Gait slow but steady. He had trouble getting up on the exam table.   BMET    Component Value Date/Time   NA 137 12/17/2016 0455   NA 141 10/11/2014 1122   K 3.8 12/17/2016 0455   CL 101 12/17/2016 0455   CO2 27 12/17/2016 0455   GLUCOSE 113 (H) 12/17/2016 0455   BUN 12 12/17/2016 0455   BUN 11 10/11/2014 1122   CREATININE 0.95 12/17/2016 0455   CREATININE 0.72 03/26/2013 1608   CALCIUM 8.1 (L) 12/17/2016 0455   GFRNONAA >60 12/17/2016 0455   GFRAA >60 12/17/2016 0455    Lipid Panel     Component Value Date/Time   CHOL 176 02/26/2016 1115   TRIG 184.0 (H) 02/26/2016 1115   HDL 35.60 (L) 02/26/2016 1115   CHOLHDL 5 02/26/2016 1115   VLDL 36.8 02/26/2016 1115   LDLCALC 103 (H) 02/26/2016 1115    CBC    Component Value Date/Time   WBC 5.3 12/18/2016 0342   RBC 3.73 (L) 12/18/2016 0342   HGB 12.0 (L) 12/18/2016 0342   HCT 35.8 (L) 12/18/2016 0342   PLT 92 (L) 12/18/2016 0342   MCV 96.0 12/18/2016 0342   MCH 32.2 12/18/2016 0342   MCHC 33.5 12/18/2016 0342   RDW 12.9 12/18/2016 0342   RDW 13.5 10/11/2014 1122   LYMPHSABS 0.8 12/17/2016 0455   LYMPHSABS 1.3 10/11/2014 1122   MONOABS 0.3 12/17/2016 0455   EOSABS 0.0 12/17/2016 0455   EOSABS 0.0 10/11/2014 1122   BASOSABS 0.0 12/17/2016 0455   BASOSABS 0.0 10/11/2014 1122    Hgb  A1C Lab Results  Component Value Date   HGBA1C 6.1 (H) 12/17/2016           Assessment & Plan:   [facility] Followup for Pneumonia:  Hospital notes, labs and imgaing reviewed Duoneb given in office He will finish his course of Levaquin Continue Flovent as prescribed Start Mucinex 600 mg every 12 hours for 3-5 days  Return precautions discussed Webb Silversmith, NP

## 2016-12-30 ENCOUNTER — Other Ambulatory Visit: Payer: Self-pay | Admitting: Internal Medicine

## 2017-01-04 ENCOUNTER — Other Ambulatory Visit: Payer: Self-pay | Admitting: Internal Medicine

## 2017-01-06 NOTE — Telephone Encounter (Signed)
Looks like this dose last filled by you was 09/2015... Please advise if okay to refill as there are other dose and instructions for more recent Rx

## 2017-01-07 NOTE — Telephone Encounter (Signed)
Can you call his daughter and verify what dose he is taking. It looks like it should be 1200 mg QHS.

## 2017-01-08 DIAGNOSIS — M7581 Other shoulder lesions, right shoulder: Secondary | ICD-10-CM | POA: Diagnosis not present

## 2017-01-08 NOTE — Telephone Encounter (Signed)
Rx sent to pharmacy   

## 2017-01-08 NOTE — Telephone Encounter (Signed)
[name] Bean called to let us know that pt takes gabapentin 400 mg - 3 at bedtime each night. She also would like to know when rx is called into [place]

## 2017-01-08 NOTE — Telephone Encounter (Signed)
Left detailed msg on VM per HIPAA Need to know dose of gabapentin at bedtime

## 2017-01-08 NOTE — Telephone Encounter (Signed)
Alexander Booth left v/m pt is taking gabapentin 400 mg taking 3 caps (1200 mg) at hs.

## 2017-01-13 ENCOUNTER — Other Ambulatory Visit: Payer: Self-pay | Admitting: Internal Medicine

## 2017-01-13 DIAGNOSIS — G4700 Insomnia, unspecified: Secondary | ICD-10-CM

## 2017-01-13 DIAGNOSIS — F03918 Unspecified dementia, unspecified severity, with other behavioral disturbance: Secondary | ICD-10-CM

## 2017-01-13 DIAGNOSIS — F0391 Unspecified dementia with behavioral disturbance: Secondary | ICD-10-CM

## 2017-01-15 NOTE — Telephone Encounter (Signed)
Last filled 04/17/16 #90 with 1 refill according to our records... I spoke to pt's daughter [name] and she states the pt has been taking meds daily. She believes maybe there was a previous Rx on file and then we refilled again.... She only fills 30 days at a time as it is expensive... Please advise if okay to refill

## 2017-01-29 DIAGNOSIS — M545 Low back pain: Secondary | ICD-10-CM | POA: Diagnosis not present

## 2017-01-29 DIAGNOSIS — E1142 Type 2 diabetes mellitus with diabetic polyneuropathy: Secondary | ICD-10-CM | POA: Diagnosis not present

## 2017-01-29 DIAGNOSIS — M546 Pain in thoracic spine: Secondary | ICD-10-CM | POA: Diagnosis not present

## 2017-01-29 DIAGNOSIS — M542 Cervicalgia: Secondary | ICD-10-CM | POA: Diagnosis not present

## 2017-03-10 ENCOUNTER — Telehealth: Payer: Self-pay

## 2017-03-10 MED ORDER — LORAZEPAM 0.5 MG PO TABS: 0.5000 mg | ORAL_TABLET | Freq: Every day | ORAL | 2 refills | Status: DC | PRN

## 2017-03-10 NOTE — Telephone Encounter (Signed)
Can this be phoned in or does it have to be faxed?

## 2017-03-10 NOTE — Telephone Encounter (Signed)
Placed in your box for signature, I will fax when done

## 2017-03-10 NOTE — Addendum Note (Signed)
Addended by: Lurlean Nanny on: 03/10/2017 12:48 PM   Modules accepted: Orders

## 2017-03-10 NOTE — Telephone Encounter (Signed)
[name] DPR signed left v/m requesting refill lorazepam to [facility]. Last refilled # 20 x 2 on 09/20/16. Last seen 12/23/16.

## 2017-03-14 ENCOUNTER — Other Ambulatory Visit: Payer: Self-pay | Admitting: Internal Medicine

## 2017-03-14 NOTE — Telephone Encounter (Signed)
Rx faxed again

## 2017-03-14 NOTE — Telephone Encounter (Signed)
Alexander Booth pts daughter left v/m (DPR signed) and requested ativan refilled to [facility] on 03/10/17; Alexander Booth called [employer] and they have no record of Ativan refill. Alexander Booth request ativan refill to [facility].

## 2017-03-17 ENCOUNTER — Other Ambulatory Visit: Payer: Self-pay | Admitting: Internal Medicine

## 2017-03-18 DIAGNOSIS — M205X1 Other deformities of toe(s) (acquired), right foot: Secondary | ICD-10-CM | POA: Diagnosis not present

## 2017-03-18 DIAGNOSIS — M79671 Pain in right foot: Secondary | ICD-10-CM | POA: Diagnosis not present

## 2017-03-18 DIAGNOSIS — M21961 Unspecified acquired deformity of right lower leg: Secondary | ICD-10-CM | POA: Diagnosis not present

## 2017-03-18 DIAGNOSIS — M7751 Other enthesopathy of right foot: Secondary | ICD-10-CM | POA: Diagnosis not present

## 2017-03-20 ENCOUNTER — Ambulatory Visit (INDEPENDENT_AMBULATORY_CARE_PROVIDER_SITE_OTHER): Payer: Medicare HMO | Admitting: Internal Medicine

## 2017-03-20 ENCOUNTER — Encounter (INDEPENDENT_AMBULATORY_CARE_PROVIDER_SITE_OTHER): Payer: Self-pay

## 2017-03-20 ENCOUNTER — Encounter: Payer: Self-pay | Admitting: Internal Medicine

## 2017-03-20 VITALS — BP 120/70 | HR 64 | Temp 97.8°F | Ht 72.0 in | Wt 176.0 lb

## 2017-03-20 DIAGNOSIS — F0281 Dementia in other diseases classified elsewhere with behavioral disturbance: Secondary | ICD-10-CM

## 2017-03-20 DIAGNOSIS — G59 Mononeuropathy in diseases classified elsewhere: Secondary | ICD-10-CM | POA: Diagnosis not present

## 2017-03-20 DIAGNOSIS — Z125 Encounter for screening for malignant neoplasm of prostate: Secondary | ICD-10-CM

## 2017-03-20 DIAGNOSIS — M549 Dorsalgia, unspecified: Secondary | ICD-10-CM | POA: Diagnosis not present

## 2017-03-20 DIAGNOSIS — Z0000 Encounter for general adult medical examination without abnormal findings: Secondary | ICD-10-CM | POA: Diagnosis not present

## 2017-03-20 DIAGNOSIS — I10 Essential (primary) hypertension: Secondary | ICD-10-CM | POA: Diagnosis not present

## 2017-03-20 DIAGNOSIS — G8929 Other chronic pain: Secondary | ICD-10-CM

## 2017-03-20 DIAGNOSIS — E785 Hyperlipidemia, unspecified: Secondary | ICD-10-CM

## 2017-03-20 DIAGNOSIS — J438 Other emphysema: Secondary | ICD-10-CM

## 2017-03-20 DIAGNOSIS — G300 Alzheimer's disease with early onset: Secondary | ICD-10-CM | POA: Diagnosis not present

## 2017-03-20 DIAGNOSIS — M47819 Spondylosis without myelopathy or radiculopathy, site unspecified: Secondary | ICD-10-CM

## 2017-03-20 DIAGNOSIS — G479 Sleep disorder, unspecified: Secondary | ICD-10-CM

## 2017-03-20 DIAGNOSIS — E118 Type 2 diabetes mellitus with unspecified complications: Secondary | ICD-10-CM

## 2017-03-20 DIAGNOSIS — F02818 Dementia in other diseases classified elsewhere, unspecified severity, with other behavioral disturbance: Secondary | ICD-10-CM

## 2017-03-20 LAB — CBC
HEMATOCRIT: 41.8 % (ref 39.0–52.0)
Hemoglobin: 14 g/dL (ref 13.0–17.0)
MCHC: 33.6 g/dL (ref 30.0–36.0)
MCV: 98.1 fl (ref 78.0–100.0)
PLATELETS: 179 10*3/uL (ref 150.0–400.0)
RBC: 4.26 Mil/uL (ref 4.22–5.81)
RDW: 13.3 % (ref 11.5–15.5)
WBC: 6.4 10*3/uL (ref 4.0–10.5)

## 2017-03-20 LAB — COMPREHENSIVE METABOLIC PANEL
ALBUMIN: 4.4 g/dL (ref 3.5–5.2)
ALK PHOS: 46 U/L (ref 39–117)
ALT: 6 U/L (ref 0–53)
AST: 12 U/L (ref 0–37)
BILIRUBIN TOTAL: 0.5 mg/dL (ref 0.2–1.2)
BUN: 12 mg/dL (ref 6–23)
CALCIUM: 9.9 mg/dL (ref 8.4–10.5)
CO2: 38 mEq/L — ABNORMAL HIGH (ref 19–32)
CREATININE: 0.91 mg/dL (ref 0.40–1.50)
Chloride: 97 mEq/L (ref 96–112)
GFR: 86.7 mL/min (ref >60.00)
Glucose, Bld: 116 mg/dL — ABNORMAL HIGH (ref 70–99)
Potassium: 4.6 mEq/L (ref 3.5–5.1)
Sodium: 138 mEq/L (ref 135–145)
Total Protein: 6.9 g/dL (ref 6.0–8.3)

## 2017-03-20 LAB — LIPID PANEL
Cholesterol: 169 mg/dL (ref 0–200)
HDL: 34.9 mg/dL — ABNORMAL LOW (ref >39.00)
LDL Cholesterol: 97 mg/dL (ref 0–99)
NONHDL: 133.78
Total CHOL/HDL Ratio: 5
Triglycerides: 183 mg/dL — ABNORMAL HIGH (ref 0.0–149.0)
VLDL: 36.6 mg/dL (ref 0.0–40.0)

## 2017-03-20 LAB — MICROALBUMIN / CREATININE URINE RATIO
CREATININE, U: 115.3 mg/dL
Microalb Creat Ratio: 0.6 mg/g (ref 0.0–30.0)

## 2017-03-20 LAB — PSA, MEDICARE: PSA: 0.43 ng/ml (ref 0.10–4.00)

## 2017-03-20 LAB — HEMOGLOBIN A1C: HEMOGLOBIN A1C: 6.2 % (ref 4.6–6.5)

## 2017-03-20 NOTE — Assessment & Plan Note (Signed)
Encouraged him to consume a low fat diet CMET and Lipid profile today Continue Simvastatin for now

## 2017-03-20 NOTE — Assessment & Plan Note (Signed)
Pain controlled with Norco and Zanaflex He will continue to follow with Dr. [name]

## 2017-03-20 NOTE — Assessment & Plan Note (Signed)
Intermittent agitation controlled with Ativan He remains independent with all ADL's Continue Depakote, Namenda and Aricept for now

## 2017-03-20 NOTE — Assessment & Plan Note (Signed)
Controlled on HCTZ CMET today  Will monitor

## 2017-03-20 NOTE — Assessment & Plan Note (Signed)
Controlled with Remeron Will monitor

## 2017-03-20 NOTE — Assessment & Plan Note (Signed)
Discussed the importance of good A1C control Continue Gabapentin for now Will monitor

## 2017-03-20 NOTE — Progress Notes (Signed)
HPI:  Pt presents to the clinic today for his Medicare Wellness Exam. He is also due to follow up chronic conditions.  HTN: His BP today is 120/70. He is taking HCTZ daily. ECG from 12/2015 reviewed.  HLD: His last LDL was 103, 02/2016. He is taking Simvastatin as prescribed. He denies myalgias. He tries to consume a low fat diet.  Dementia with Behavioral Disturbance: He no longer follows with Dr.Athar. He is taking Aricept, Namenda and Depakote as prescribed. He takes Ativan a few days per week.  Chronic Back Pain, OA: s/p spinal cord stimulator 11/2014. He follows with Dr. [name]. He takes Norco and Zanaflex as needed for pain.  DM 2: His last A1C was 6.1%, 11/2016. He does not check his sugars. He is taking Metformin as prescribed. He sees Dr. Milinda Pointer, podiatry. He gets an eye exam yearly. He does have issues with neuropathy, controlled with Gabapentin.  Sleep Disturbance: He has difficulty staying asleep. He is taking Remeron nightly as prescribed.   Emphysema: He does continue to smoke. He reports chronic cough and mild shortness of breath. He is taking Flovent daily as prescribed. He does not follow with pulmonology.   Past Medical History:  Diagnosis Date  . Anginal pain ([place])   . Bronchitis    Subacute and hx of ongoing cigarette smoking one half pack daily  . COPD (chronic obstructive pulmonary disease) ([place])   . Dementia    He has a hx of the   . Depression   . Erectile dysfunction    He's had a hx of   . FHx: heart disease    Positive for   . Fracture of lumbar vertebra ([place]) 11/25/2012  . Hard of hearing    very little hearing on left side;better in right ear  . Hypercholesterolemia   . Hypertension    Essential  . Lumbosacral disc disease    with chronic low back pain, has implanted nerve stimulator  . Peripheral neuropathy   . Presenile dementia   . Type 2 diabetes mellitus ([place]) 2010   takes oral meds    Current Outpatient Prescriptions  Medication Sig Dispense  Refill  . aspirin 81 MG chewable tablet Chew 81 mg by mouth daily.     . bisacodyl (DULCOLAX) 5 MG EC tablet Take 5 mg by mouth daily as needed for mild constipation.     . divalproex (DEPAKOTE ER) 500 MG 24 hr tablet TAKE 2 TABLETS EVERY DAY (NEED MD APPOINTMENT) 180 tablet 1  . donepezil (ARICEPT) 10 MG tablet Take 1 tablet (10 mg total) by mouth every morning. 90 tablet 3  . fluticasone (FLOVENT HFA) 110 MCG/ACT inhaler Inhale 1 puff into the lungs 2 (two) times daily. (Patient taking differently: Inhale 1 puff into the lungs 2 (two) times daily as needed (shortness of breath). ) 1 Inhaler 12  . gabapentin (NEURONTIN) 400 MG capsule TAKE 3 CAPSULES AT BEDTIME AND MAY REPEAT DOSE ONE TIME IF NEEDED 270 capsule 1  . hydrochlorothiazide (HYDRODIURIL) 25 MG tablet TAKE 2 TABLETS (50 MG TOTAL)  DAILY. (Patient taking differently: TAKE 1 TABLET TWICE DAILY) 180 tablet 1  . LORazepam (ATIVAN) 0.5 MG tablet TAKE 1 TABLET EVERY DAY AS NEEDED 20 tablet 2  . memantine (NAMENDA XR) 7 MG CP24 24 hr capsule TAKE 1 CAPSULE (7 MG TOTAL) BY MOUTH EVERY MORNING. 30 capsule 5  . metFORMIN (GLUCOPHAGE) 500 MG tablet TAKE 1 TABLET TWICE DAILY 180 tablet 1  . mirtazapine (REMERON) 45 MG  tablet TAKE 1 TABLET AT BEDTIME 90 tablet 1  . nitroGLYCERIN (NITROSTAT) 0.4 MG SL tablet Place 0.4 mg under the tongue every 5 (five) minutes as needed for chest pain.     . NONFORMULARY OR COMPOUNDED ITEM [facility]:  Peripheral Neuropathy cream - Bupivacaine 1%, Doxepin 3%, Gabapentin 6%, Pentoxifylline 3%, Topiramate 1%, apply to affected area 1-2 grams 3-4 times daily (Patient taking differently: Apply 1 application topically daily as needed (PAIN). [facility]:  Peripheral Neuropathy cream - Bupivacaine 1%, Doxepin 3%, Gabapentin 6%, Pentoxifylline 3%, Topiramate 1%, apply to affected area 1-2 grams 3-4 times daily) 120 each 3  . simvastatin (ZOCOR) 40 MG tablet Take 1 tablet (40 mg total) by mouth daily at 6 PM.  (Patient taking differently: Take 40 mg by mouth 3 (three) times a week. Monday, wedneday and Friday) 30 tablet 6  . tiZANidine (ZANAFLEX) 4 MG tablet Take 4 mg by mouth 4 (four) times daily.     . VENTOLIN HFA 108 (90 Base) MCG/ACT inhaler INHALE 2 PUFFS INTO THE LUNGS EVERY 6 (SIX) HOURS AS NEEDED FOR WHEEZING OR SHORTNESS OF BREATH. 18 Inhaler 0   No current facility-administered medications for this visit.     Allergies  Allergen Reactions  . Codeine Hives and Itching    Family History  Problem Relation Age of Onset  . Multiple myeloma Mother   . Diabetes Mother   . Cancer Sister        lymphoma  . Diabetes Sister   . Stroke Neg Hx     Social History   Social History  . Marital status: Married    Spouse name: Vaughan Basta  . Number of children: 3  . Years of education: 12   Occupational History  . retired     retired   Social History Main Topics  . Smoking status: Current Every Day Smoker    Packs/day: 1.00    Years: 40.00  . Smokeless tobacco: Never Used  . Alcohol use No  . Drug use: No  . Sexual activity: Not on file   Other Topics Concern  . Not on file   Social History Narrative   Patient is right handed   Patient resides with wife,consumes 2-3 cups of caffeine daily    Hospitiliaztions: 11/2016, pneumonia  Health Maintenance:    Flu: 04/2016  Tetanus: 08/2012  Pneumovax: 08/2012  Prevnar: 09/2015  Zostavax: 09/2012  PSA: 02/2016  Colon Screening: 02/2015  Eye Doctor: 06/2016, [facility]  Dental Exam: dentures   Providers:   PCP: Webb Silversmith, NP-C  Podiatry: Dr. Milinda Pointer     I have personally reviewed and have noted:  1. The patient's medical and social history 2. Their use of alcohol, tobacco or illicit drugs 3. Their current medications and supplements 4. The patient's functional ability including ADL's, fall risks, home safety risks and hearing or visual impairment. 5. Diet and physical activities 6. Evidence for depression or mood  disorder  Subjective:   Review of Systems:   Constitutional: Denies fever, malaise, fatigue, headache or abrupt weight changes.  HEENT: Pt reports visual changes. Denies eye pain, eye redness, ear pain, ringing in the ears, wax buildup, runny nose, nasal congestion, bloody nose, or sore throat. Respiratory: Pt reports cough and mild shortness of breath. Denies difficulty breathing, or sputum production.   Cardiovascular: Denies chest pain, chest tightness, palpitations or swelling in the hands or feet.  Gastrointestinal: Pt reports intermittent lower abdominal pain. Denies bloating, constipation, diarrhea or blood  in the stool.  GU: Denies urgency, frequency, pain with urination, burning sensation, blood in urine, odor or discharge. Musculoskeletal: Pt reports chronic back pain. Denies decrease in range of motion, difficulty with gait, muscle pain or joint swelling.  Skin: Denies redness, rashes, lesions or ulcercations.  Neurological: Pt reports difficulty with memory. Denies dizziness, difficulty with speech or problems with balance and coordination.  Psych: Denies anxiety, depression, SI/HI.  No other specific complaints in a complete review of systems (except as listed in HPI above).  Objective:  PE:   BP 120/70   Pulse 64   Temp 97.8 F (36.6 C) (Oral)   Ht 6' (1.829 m)   Wt 176 lb (79.8 kg)   SpO2 96%   BMI 23.87 kg/m   Wt Readings from Last 3 Encounters:  12/23/16 171 lb 8 oz (77.8 kg)  12/17/16 180 lb 1.6 oz (81.7 kg)  11/04/16 184 lb 12 oz (83.8 kg)    General: Appears his stated age, in NAD. Skin: Warm, dry and intact. Scaly lesion noted on left forearm (he will make an appt with derm for this) Neck: Neck supple, trachea midline. No masses, lumps or thyromegaly present.  Cardiovascular: Normal rate and rhythm. S1,S2 noted.  No murmur, rubs or gallops noted. No JVD or BLE edema. No carotid bruits noted. Pulmonary/Chest: Normal effort and positive vesicular breath  sounds. No respiratory distress. No wheezes, rales or ronchi noted.  Abdomen: Soft and nontender. Hypoactive bowel sounds. No distention or masses noted.  Musculoskeletal:  Strength 54/5 BUE/BLE. No difficulty with gait. Neurological: Alert and oriented.  Psychiatric: Mood and affect normal. Behavior is normal. Judgment and thought content normal.     BMET    Component Value Date/Time   NA 137 12/17/2016 0455   NA 141 10/11/2014 1122   K 3.8 12/17/2016 0455   CL 101 12/17/2016 0455   CO2 27 12/17/2016 0455   GLUCOSE 113 (H) 12/17/2016 0455   BUN 12 12/17/2016 0455   BUN 11 10/11/2014 1122   CREATININE 0.95 12/17/2016 0455   CREATININE 0.72 03/26/2013 1608   CALCIUM 8.1 (L) 12/17/2016 0455   GFRNONAA >60 12/17/2016 0455   GFRAA >60 12/17/2016 0455    Lipid Panel     Component Value Date/Time   CHOL 176 02/26/2016 1115   TRIG 184.0 (H) 02/26/2016 1115   HDL 35.60 (L) 02/26/2016 1115   CHOLHDL 5 02/26/2016 1115   VLDL 36.8 02/26/2016 1115   LDLCALC 103 (H) 02/26/2016 1115    CBC    Component Value Date/Time   WBC 5.3 12/18/2016 0342   RBC 3.73 (L) 12/18/2016 0342   HGB 12.0 (L) 12/18/2016 0342   HCT 35.8 (L) 12/18/2016 0342   PLT 92 (L) 12/18/2016 0342   MCV 96.0 12/18/2016 0342   MCH 32.2 12/18/2016 0342   MCHC 33.5 12/18/2016 0342   RDW 12.9 12/18/2016 0342   RDW 13.5 10/11/2014 1122   LYMPHSABS 0.8 12/17/2016 0455   LYMPHSABS 1.3 10/11/2014 1122   MONOABS 0.3 12/17/2016 0455   EOSABS 0.0 12/17/2016 0455   EOSABS 0.0 10/11/2014 1122   BASOSABS 0.0 12/17/2016 0455   BASOSABS 0.0 10/11/2014 1122    Hgb A1C Lab Results  Component Value Date   HGBA1C 6.1 (H) 12/17/2016      Assessment and Plan:   Medicare Annual Wellness Visit:  Diet: He does not eat meat. He consumes lots of fruits, some veggies. He tries to avoid fried foods. He drinks mostly water, some  soda. Physical activity: None Depression/mood screen: Negative Hearing: Intact to whispered  voice Visual acuity: Grossly normal, performs annual eye exam  ADLs: Capable Fall risk: None Home safety: Good Cognitive evaluation: He has known dementia, on treatment. EOL planning: Adv directives, DNr/ I agree  Preventative Medicine: Encouraged him to get a flu shot in the fall. All other immunizations are UTD. Colon screening UTD. PSA today with labs. Encouraged him to consume a balanced diet and exercise regimen. Advised him to see an eye doctor annually. He will let me know if abdominal pain is persistent or worse. Will check CBC, CMET, Lipid, PSA, A1C and urine microalbumin today.   Next appointment: 1 year, Medicare Wellness Exam   Webb Silversmith, NP

## 2017-03-20 NOTE — Assessment & Plan Note (Signed)
A1C and microalbumin today Encouraged him to consume a low carb diet Continue Metformin Continue to follow with podiatry Continue yearly eye exams Encouraged him to get a flu shot in the fall Pneumonia vaccines are UTD

## 2017-03-20 NOTE — Patient Instructions (Signed)

## 2017-03-20 NOTE — Assessment & Plan Note (Signed)
Chronic but stable on Flovent Discussed smoking cessation, he declines at this point Offered Lung Cancer screening, he declines Will monitor for now

## 2017-03-25 DIAGNOSIS — M79671 Pain in right foot: Secondary | ICD-10-CM | POA: Diagnosis not present

## 2017-03-28 DIAGNOSIS — M79671 Pain in right foot: Secondary | ICD-10-CM | POA: Diagnosis not present

## 2017-04-01 DIAGNOSIS — M79671 Pain in right foot: Secondary | ICD-10-CM | POA: Diagnosis not present

## 2017-04-08 DIAGNOSIS — M79671 Pain in right foot: Secondary | ICD-10-CM | POA: Diagnosis not present

## 2017-04-10 DIAGNOSIS — M79671 Pain in right foot: Secondary | ICD-10-CM | POA: Diagnosis not present

## 2017-04-16 ENCOUNTER — Other Ambulatory Visit: Payer: Self-pay | Admitting: Internal Medicine

## 2017-04-16 DIAGNOSIS — R609 Edema, unspecified: Secondary | ICD-10-CM

## 2017-04-29 ENCOUNTER — Other Ambulatory Visit: Payer: Self-pay | Admitting: *Deleted

## 2017-04-29 MED ORDER — SIMVASTATIN 40 MG PO TABS: 40.0000 mg | ORAL_TABLET | ORAL | 1 refills | Status: DC

## 2017-04-29 NOTE — Telephone Encounter (Signed)
I will send RX to [facility] per request

## 2017-04-29 NOTE — Telephone Encounter (Signed)
Patient's daughter [name] (on [place]) called stating that patient has been getting Simvastatin from Dr. Mare Ferrari his cardiologist and he has retired. [name] wanted to know if Webb Silversmith, FNP will take over ordering this medication for him? [name] stated that he is taking this Monday, Wednesday and Friday and would like a new script sent to [facility]. [name] requested a call back letting her know if [name] will take this over? [name] requested a 90 day supply.

## 2017-04-29 NOTE — Telephone Encounter (Signed)
Patients daughter [name] notified that script has been sent to the pharmacy.

## 2017-05-01 DIAGNOSIS — I10 Essential (primary) hypertension: Secondary | ICD-10-CM | POA: Diagnosis not present

## 2017-05-01 DIAGNOSIS — E1142 Type 2 diabetes mellitus with diabetic polyneuropathy: Secondary | ICD-10-CM | POA: Diagnosis not present

## 2017-05-01 DIAGNOSIS — M545 Low back pain: Secondary | ICD-10-CM | POA: Diagnosis not present

## 2017-05-01 DIAGNOSIS — M546 Pain in thoracic spine: Secondary | ICD-10-CM | POA: Diagnosis not present

## 2017-05-05 ENCOUNTER — Other Ambulatory Visit: Payer: Self-pay | Admitting: Internal Medicine

## 2017-06-05 DIAGNOSIS — G5761 Lesion of plantar nerve, right lower limb: Secondary | ICD-10-CM | POA: Diagnosis not present

## 2017-06-05 DIAGNOSIS — M79671 Pain in right foot: Secondary | ICD-10-CM | POA: Diagnosis not present

## 2017-06-05 DIAGNOSIS — M7751 Other enthesopathy of right foot: Secondary | ICD-10-CM | POA: Diagnosis not present

## 2017-06-05 DIAGNOSIS — M21961 Unspecified acquired deformity of right lower leg: Secondary | ICD-10-CM | POA: Diagnosis not present

## 2017-06-05 DIAGNOSIS — M205X1 Other deformities of toe(s) (acquired), right foot: Secondary | ICD-10-CM | POA: Diagnosis not present

## 2017-06-18 ENCOUNTER — Telehealth: Payer: Self-pay

## 2017-06-18 ENCOUNTER — Ambulatory Visit (INDEPENDENT_AMBULATORY_CARE_PROVIDER_SITE_OTHER): Payer: Medicare HMO

## 2017-06-18 ENCOUNTER — Encounter: Payer: Self-pay | Admitting: Podiatry

## 2017-06-18 ENCOUNTER — Ambulatory Visit: Payer: Medicare HMO | Admitting: Podiatry

## 2017-06-18 DIAGNOSIS — M7751 Other enthesopathy of right foot: Secondary | ICD-10-CM | POA: Diagnosis not present

## 2017-06-18 DIAGNOSIS — M21611 Bunion of right foot: Secondary | ICD-10-CM | POA: Diagnosis not present

## 2017-06-18 NOTE — Patient Instructions (Signed)
Pre-Operative Instructions  Congratulations, you have decided to take an important step towards improving your quality of life.  You can be assured that the doctors and staff at [facility] will be with you every step of the way.  Here are some important things you should know:  1. Plan to be at the surgery center/hospital at least 1 (one) hour prior to your scheduled time, unless otherwise directed by the surgical center/hospital staff.  You must have a responsible adult accompany you, remain during the surgery and drive you home.  Make sure you have directions to the surgical center/hospital to ensure you arrive on time. 2. If you are having surgery at [facility] or [facility], you will need a copy of your medical history and physical form from your family physician within one month prior to the date of surgery. We will give you a form for your primary physician to complete.  3. We make every effort to accommodate the date you request for surgery.  However, there are times where surgery dates or times have to be moved.  We will contact you as soon as possible if a change in schedule is required.   4. No aspirin/ibuprofen for one week before surgery.  If you are on aspirin, any non-steroidal anti-inflammatory medications (Mobic, Aleve, Ibuprofen) should not be taken seven (7) days prior to your surgery.  You make take Tylenol for pain prior to surgery.  5. Medications - If you are taking daily heart and blood pressure medications, seizure, reflux, allergy, asthma, anxiety, pain or diabetes medications, make sure you notify the surgery center/hospital before the day of surgery so they can tell you which medications you should take or avoid the day of surgery. 6. No food or drink after midnight the night before surgery unless directed otherwise by surgical center/hospital staff. 7. No alcoholic beverages 24-hours prior to surgery.  No smoking 24-hours prior or 24-hours after  surgery. 8. Wear loose pants or shorts. They should be loose enough to fit over bandages, boots, and casts. 9. Don't wear slip-on shoes. Sneakers are preferred. 10. Bring your boot with you to the surgery center/hospital.  Also bring crutches or a walker if your physician has prescribed it for you.  If you do not have this equipment, it will be provided for you after surgery. 11. If you have not been contacted by the surgery center/hospital by the day before your surgery, call to confirm the date and time of your surgery. 12. Leave-time from work may vary depending on the type of surgery you have.  Appropriate arrangements should be made prior to surgery with your employer. 13. Prescriptions will be provided immediately following surgery by your doctor.  Fill these as soon as possible after surgery and take the medication as directed. Pain medications will not be refilled on weekends and must be approved by the doctor. 14. Remove nail polish on the operative foot and avoid getting pedicures prior to surgery. 15. Wash the night before surgery.  The night before surgery wash the foot and leg well with water and the antibacterial soap provided. Be sure to pay special attention to beneath the toenails and in between the toes.  Wash for at least three (3) minutes. Rinse thoroughly with water and dry well with a towel.  Perform this wash unless told not to do so by your physician.  Enclosed: 1 Ice pack (please put in freezer the night before surgery)   1 Hibiclens skin cleaner     Pre-op instructions  If you have any questions regarding the instructions, please do not hesitate to call our office.  [place]: [address], [place], [place] [address] -- [phone]  [place]: [address] [place], [place] [address] -- [phone]  [place]: [address]  [place], [place] [address] -- [phone]  [place]: [address], [address], [place], [place] [address] -- [phone]  Website:  [email or web address] 

## 2017-06-18 NOTE — Telephone Encounter (Signed)
Copied from [place] [id number]. Topic: Inquiry >> Jun 18, 2017  4:54 PM Alexander Booth, NT wrote: Reason for CRM: pt states he needs diabetic shoes and they told him he can not see a NP he would have to see a doctor. Pt wanted me to send a message and let Baity know.

## 2017-06-19 NOTE — Telephone Encounter (Signed)
noted 

## 2017-06-24 ENCOUNTER — Other Ambulatory Visit: Payer: Self-pay | Admitting: Internal Medicine

## 2017-06-24 NOTE — Progress Notes (Signed)
   HPI: 71 year old male presents today for evaluation of toe pain to the right foot.  Patient experiences significant pain regarding the second digit of the right foot.  He states that the great toe is pushing against the right foot and is extremely painful.  Patient is unable to ambulate without pain.  Patient recently underwent surgical intervention with correction of a bunion deformity correction however the great toe is slightly pressing against the second digit and he states it is extremely painful.  No alleviating factors.  He feels as though the second toe feels broken.  He experienced his constant pain during ambulation.  Symptoms are alleviated by rest.  Past Medical History:  Diagnosis Date  . COPD (chronic obstructive pulmonary disease) ([place])   . Depression   . Erectile dysfunction    He's had a hx of   . Hypercholesterolemia   . Hypertension    Essential  . Lumbosacral disc disease    with chronic low back pain, has implanted nerve stimulator  . Peripheral neuropathy   . Presenile dementia   . Type 2 diabetes mellitus ([place]) 2010   takes oral meds     Physical Exam: General: The patient is alert and oriented x3 in no acute distress.  Dermatology: Skin is warm, dry and supple bilateral lower extremities. Negative for open lesions or macerations.  Vascular: Palpable pedal pulses bilaterally. No edema or erythema noted. Capillary refill within normal limits.  Neurological: Epicritic and protective threshold grossly intact bilaterally.   Musculoskeletal Exam: Range of motion within normal limits to all pedal and ankle joints bilateral. Muscle strength 5/5 in all groups bilateral.   Radiographic Exam:  Normal osseous mineralization. Joint spaces preserved. No fracture/dislocation/boney destruction.  There does appear to be some increased hallux abductus interphalangeal angle consistent with lateral deviation of the great toe.  Also on radiographic exam is an elongated second  metatarsal contributory likely to this patient's second toe pain  Assessment: -Hallux abductus right great toe - Hammertoe second digit right foot -Elongated second metatarsal right foot   Plan of Care:  -Patient was evaluated today.  X-rays reviewed in detail today. -Today we discussed the patient's symptoms and conservative versus surgical management regarding the pathology.  I did offer conservative modalities however he states pain is intolerable and does not feel conservative modalities are alleviating any symptoms.  Patient would like to proceed with surgical intervention.  All possible complications and details of the procedure were explained.  No guarantees were expressed or implied. -Authorization for surgery initiated today.  Surgery will consist of Akin bunionectomy right foot.  Second metatarsal Weil osteotomy right foot.  Surgery will also need to include second MPJ capsulotomy right foot -Return to clinic 1 week postop   Edrick Kins, DPM [facility]  Dr. Edrick Kins, DPM    [address], [place] [address]                Office [phone]  Fax [phone]

## 2017-06-25 ENCOUNTER — Ambulatory Visit: Payer: Medicare HMO | Admitting: Podiatry

## 2017-06-26 ENCOUNTER — Other Ambulatory Visit: Payer: Self-pay

## 2017-06-26 ENCOUNTER — Ambulatory Visit: Payer: Medicare HMO | Admitting: Family Medicine

## 2017-06-26 ENCOUNTER — Encounter: Payer: Self-pay | Admitting: Family Medicine

## 2017-06-26 VITALS — BP 112/60 | HR 65 | Temp 97.8°F | Ht 72.0 in | Wt 184.5 lb

## 2017-06-26 DIAGNOSIS — G63 Polyneuropathy in diseases classified elsewhere: Secondary | ICD-10-CM | POA: Diagnosis not present

## 2017-06-26 DIAGNOSIS — E118 Type 2 diabetes mellitus with unspecified complications: Secondary | ICD-10-CM

## 2017-06-26 NOTE — Progress Notes (Signed)
Dr. Frederico Hamman T. Kahmari Koller, MD, [facility] [place], [address] Phone: [phone] Fax: [phone]  06/26/2017  Patient: Alexander Booth, MRN: [id number], DOB: 10-Jan-1946, 71 y.o.  Primary Physician:  [name], NP   Chief Complaint  Patient presents with  . Diabetes    Needs MD to sign off for Diabetic [facility] will send over paperwork   Subjective:   Alexander Booth is a 71 y.o. very pleasant male patient who presents with the following:  Foot exam for DM shoes:  Patient has diabetes, and he also has diabetic neuropathy and is on gabapentin as well as some topical medications.  He is here for evaluation for diabetic shoes.  Lab Results  Component Value Date   HGBA1C 6.2 03/20/2017     Past Medical History, Surgical History, Social History, Family History, Problem List, Medications, and Allergies have been reviewed and updated if relevant.  Patient Active Problem List   Diagnosis Date Noted  . Tobacco dependence 12/17/2016  . Dyslipidemia 01/13/2016  . Essential hypertension 01/13/2016  . Peripheral neuropathy 01/13/2016  . 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01/10/2016  . Sleep disturbance 10/25/2014  . Emphysema of lung ([place]) 10/25/2014  . Chronic back pain 10/26/2013  . Type 2 diabetes mellitus with complication ([place]) 67/61/9509  . Osteoarthritis 06/18/2011    Past Medical History:  Diagnosis Date  . COPD (chronic obstructive pulmonary disease) ([place])   . Depression   . Erectile dysfunction    He's had a hx of   . Hypercholesterolemia   . Hypertension    Essential  . Lumbosacral disc disease    with chronic low back pain, has implanted nerve stimulator  . Peripheral neuropathy   . Presenile dementia   . Type 2 diabetes mellitus ([place]) 2010   takes oral meds    Past Surgical History:  Procedure Laterality Date  . ANTERIOR CERVICAL DECOMP/DISCECTOMY FUSION    .  APPENDECTOMY    . HEMORRHOID SURGERY     x2  . JOINT REPLACEMENT Left    2nd one was to change siZe  . KNEE SURGERY     Previous left total replacement  . KYPHOPLASTY N/A 12/10/2012   Procedure: Lumbar one Kyphoplasty;  Surgeon: Kristeen Miss, MD;  Location: [facility];  Service: Neurosurgery;  Laterality: N/A;  Lumbar One Kyphoplasty  . OTHER SURGICAL HISTORY  1992   Bone Fusion in Right Foot  . SPINAL CORD STIMULATOR INSERTION N/A 12/02/2014   Procedure: THORACIC SPINAL CORD STIMULATOR INSERTION INFINION [employer]OSTON [employer] 16 LEAD WITH LAMINECTOMY;  Surgeon: Erline Levine, MD;  Location: [facility];  Service: Neurosurgery;  Laterality: N/A;  THORACIC SPINAL CORD STIMULATOR INSERTION INFINION [employer]ON [employer] 16 LEAD WITH LAMINECTOMY  . TOTAL KNEE ARTHROPLASTY Left     Social History   Socioeconomic History  . Marital status: Married    Spouse name: Vaughan Basta  . Number of children: 3  . Years of education: 71  . Highest education level: Not on file  Social Needs  . Financial resource strain: Not on file  . Food insecurity - worry: Not on file  . Food insecurity - inability: Not on file  . Transportation needs - medical: Not on file  . Transportation needs - non-medical: Not on file  Occupational History  . Occupation: retired    Comment: retired  Tobacco Use  . Smoking status: Current Every Day Smoker    Packs/day: 1.00  Years: 40.00    Pack years: 40.00  . Smokeless tobacco: Never Used  Substance and Sexual Activity  . Alcohol use: No    Alcohol/week: 0.0 oz  . Drug use: No  . Sexual activity: Not on file  Other Topics Concern  . Not on file  Social History Narrative   Patient is right handed   Patient resides with wife,consumes 2-3 cups of caffeine daily    Family History  Problem Relation Age of Onset  . Multiple myeloma Mother   . Diabetes Mother   . Cancer Sister        lymphoma  . Diabetes Sister   . Stroke Neg Hx     Allergies  Allergen Reactions    . Codeine Hives and Itching    Medication list reviewed and updated in full in [place].   GEN: No acute illnesses, no fevers, chills. GI: No n/v/d, eating normally Pulm: No SOB Interactive and getting along well at home.  Otherwise, ROS is as per the HPI.  Objective:   BP 112/60   Pulse 65   Temp 97.8 F (36.6 C) (Oral)   Ht 6' (1.829 m)   Wt 184 lb 8 oz (83.7 kg)   BMI 25.02 kg/m   GEN: WDWN, NAD, Non-toxic, A & O x 3 HEENT: Atraumatic, Normocephalic. Neck supple. No masses, No LAD. Ears and Nose: No external deformity. CV: RRR, No M/G/R. No JVD. No thrill. No extra heart sounds. PULM: CTA B, no wheezes, crackles, rhonchi. No retractions. No resp. distress. No accessory muscle use. EXTR: No c/c/e NEURO Normal gait.  PSYCH: Normally interactive. Conversant. Not depressed or anxious appearing.  Calm demeanor.   Laboratory and Imaging Data:  Assessment and Plan:   Type 2 diabetes mellitus with complication, unspecified whether long term insulin use (HCC)  Polyneuropathy associated with underlying disease ([place])  Type 2 diabetes with polyneuropathy.  Patient would be a good candidate for diabetic shoe to help alleviate some of his pain and symptoms.  Follow-up: No Follow-up on file.  Future Appointments  Date Time Provider [place]  07/01/2017  9:00 AM [facility]   Signed,  Frederico Hamman T. Legacy Lacivita, MD   Allergies as of 06/26/2017      Reactions   Codeine Hives, Itching      Medication List        Accurate as of 06/26/17  1:35 PM. Always use your most recent med list.          aspirin 81 MG chewable tablet Chew 81 mg by mouth daily.   bisacodyl 5 MG EC tablet Commonly known as:  DULCOLAX Take 5 mg by mouth daily as needed for mild constipation.   divalproex 500 MG 24 hr tablet Commonly known as:  DEPAKOTE ER TAKE 2 TABLETS EVERY DAY (NEED MD APPOINTMENT)   donepezil 10 MG tablet Commonly known as:   ARICEPT Take 1 tablet (10 mg total) by mouth every morning.   fluticasone 110 MCG/ACT inhaler Commonly known as:  FLOVENT HFA Inhale 1 puff into the lungs 2 (two) times daily.   gabapentin 400 MG capsule Commonly known as:  NEURONTIN TAKE 3 CAPSULES AT BEDTIME AND MAY REPEAT DOSE ONE TIME IF NEEDED   hydrochlorothiazide 25 MG tablet Commonly known as:  HYDRODIURIL TAKE 2 TABLETS (50 MG TOTAL)  DAILY.   HYDROcodone-acetaminophen 7.5-325 MG tablet Commonly known as:  NORCO Take 1 tablet by mouth every 6 (six) hours as needed. for pain  LORazepam 0.5 MG tablet Commonly known as:  ATIVAN TAKE 1 TABLET EVERY DAY AS NEEDED   memantine 7 MG Cp24 24 hr capsule Commonly known as:  NAMENDA XR TAKE 1 CAPSULE (7 MG TOTAL) BY MOUTH EVERY MORNING.   metFORMIN 500 MG tablet Commonly known as:  GLUCOPHAGE TAKE 1 TABLET TWICE DAILY   mirtazapine 45 MG tablet Commonly known as:  REMERON TAKE 1 TABLET AT BEDTIME   nitroGLYCERIN 0.4 MG SL tablet Commonly known as:  NITROSTAT Place 0.4 mg under the tongue every 5 (five) minutes as needed for chest pain.   NONFORMULARY OR COMPOUNDED [place]:  Peripheral Neuropathy cream - Bupivacaine 1%, Doxepin 3%, Gabapentin 6%, Pentoxifylline 3%, Topiramate 1%, apply to affected area 1-2 grams 3-4 times daily   simvastatin 40 MG tablet Commonly known as:  ZOCOR Take 1 tablet (40 mg total) by mouth 3 (three) times a week. Monday, wedneday and Friday   tiZANidine 4 MG tablet Commonly known as:  ZANAFLEX Take 4 mg by mouth 4 (four) times daily.   VENTOLIN HFA 108 (90 Base) MCG/ACT inhaler Generic drug:  albuterol INHALE 2 PUFFS INTO THE LUNGS EVERY 6 (SIX) HOURS AS NEEDED FOR WHEEZING OR SHORTNESS OF BREATH.

## 2017-07-01 ENCOUNTER — Ambulatory Visit: Payer: Medicare HMO

## 2017-07-02 ENCOUNTER — Telehealth: Payer: Self-pay | Admitting: *Deleted

## 2017-07-02 NOTE — Telephone Encounter (Signed)
"  I need to cancel my surgery that is scheduled for January 3.  My daughter is having some male problems so I'm going to hold off for now.  I'll schedule an appointment with Dr. Amalia Hailey for follow-up."  I will cancel it at the surgical center and let Dr. Amalia Hailey know.

## 2017-07-03 ENCOUNTER — Other Ambulatory Visit: Payer: Self-pay | Admitting: Internal Medicine

## 2017-07-03 NOTE — Telephone Encounter (Signed)
Copied from [place]. Topic: Quick Communication - Rx Refill/Question >> Jul 03, 2017 10:42 AM Marin Olp L wrote: Has the patient contacted their pharmacy? Yes.  (no more refills) (Agent: If no, request that the patient contact the pharmacy for the refill.) Preferred Pharmacy (with phone number or street name): [id number] [place] [place]: Please be advised that RX refills may take up to 3 business days. We ask that you follow-up with your pharmacy. Refill LORazepam (ATIVAN) 0.5 MG tablet

## 2017-07-04 ENCOUNTER — Ambulatory Visit: Payer: Medicare HMO | Admitting: Orthotics

## 2017-07-04 DIAGNOSIS — E0842 Diabetes mellitus due to underlying condition with diabetic polyneuropathy: Secondary | ICD-10-CM

## 2017-07-04 DIAGNOSIS — M7751 Other enthesopathy of right foot: Secondary | ICD-10-CM

## 2017-07-04 DIAGNOSIS — M2041 Other hammer toe(s) (acquired), right foot: Secondary | ICD-10-CM

## 2017-07-04 DIAGNOSIS — L84 Corns and callosities: Secondary | ICD-10-CM

## 2017-07-04 DIAGNOSIS — M2011 Hallux valgus (acquired), right foot: Secondary | ICD-10-CM

## 2017-07-04 MED ORDER — LORAZEPAM 0.5 MG PO TABS: ORAL_TABLET | ORAL | 2 refills | Status: DC

## 2017-07-04 NOTE — Telephone Encounter (Signed)
Pt asking for refill of Lorazepam. Last refill on 03/17/17. Last OV 06/26/17.

## 2017-07-04 NOTE — Telephone Encounter (Signed)
Ok to phone in Ativan 

## 2017-07-04 NOTE — Progress Notes (Signed)
Patient came in today for fitting and eval for diabetic shoes: Patient' doctor here is [place]  Patient presents with DM2, HAV, HT  Patient was measured with brannock 11.5W device and cast in foam for custom inserts.  Patient chose Y78

## 2017-07-04 NOTE — Telephone Encounter (Signed)
Rx called in to pharmacy. 

## 2017-07-04 NOTE — Telephone Encounter (Signed)
Pt requesting refill lorazepam [facility] [place]. Last refilled # 20 x 2 on 03/17/17. Last annual 03/20/17.

## 2017-07-10 DIAGNOSIS — L570 Actinic keratosis: Secondary | ICD-10-CM | POA: Diagnosis not present

## 2017-07-10 DIAGNOSIS — D225 Melanocytic nevi of trunk: Secondary | ICD-10-CM | POA: Diagnosis not present

## 2017-07-10 DIAGNOSIS — L821 Other seborrheic keratosis: Secondary | ICD-10-CM | POA: Diagnosis not present

## 2017-07-10 DIAGNOSIS — Z85828 Personal history of other malignant neoplasm of skin: Secondary | ICD-10-CM | POA: Diagnosis not present

## 2017-07-11 DIAGNOSIS — H04123 Dry eye syndrome of bilateral lacrimal glands: Secondary | ICD-10-CM | POA: Diagnosis not present

## 2017-07-11 DIAGNOSIS — E119 Type 2 diabetes mellitus without complications: Secondary | ICD-10-CM | POA: Diagnosis not present

## 2017-07-11 DIAGNOSIS — H02831 Dermatochalasis of right upper eyelid: Secondary | ICD-10-CM | POA: Diagnosis not present

## 2017-07-11 DIAGNOSIS — Z961 Presence of intraocular lens: Secondary | ICD-10-CM | POA: Diagnosis not present

## 2017-07-11 DIAGNOSIS — H35372 Puckering of macula, left eye: Secondary | ICD-10-CM | POA: Diagnosis not present

## 2017-07-11 DIAGNOSIS — H02834 Dermatochalasis of left upper eyelid: Secondary | ICD-10-CM | POA: Diagnosis not present

## 2017-07-11 DIAGNOSIS — H43811 Vitreous degeneration, right eye: Secondary | ICD-10-CM | POA: Diagnosis not present

## 2017-07-11 DIAGNOSIS — H26491 Other secondary cataract, right eye: Secondary | ICD-10-CM | POA: Diagnosis not present

## 2017-07-11 LAB — HM DIABETES EYE EXAM

## 2017-07-16 ENCOUNTER — Telehealth: Payer: Self-pay | Admitting: *Deleted

## 2017-07-16 NOTE — Telephone Encounter (Signed)
Called back and spoke to pt's husband. I asked if Mr. Alexander Booth was available and she said no but asked who was calling. I told her my name and that I was calling from [place] to reschedule his surgery. She said to leave the message with her and I gave her the options of either 17 January or 24 January. Pt's wife stated to put him down to have surgery on 17 January. I told her that someone from the surgical center would call him a day or two before to let him know what time to arrive. I told her if they had any questions before surgery to call our office. I documented in the surgery book pt's new date of surgery.

## 2017-07-16 NOTE — Telephone Encounter (Signed)
Pt states he had to cancel his surgery due to his dtr, would like to reschedule.

## 2017-07-17 DIAGNOSIS — H26491 Other secondary cataract, right eye: Secondary | ICD-10-CM | POA: Diagnosis not present

## 2017-07-17 DIAGNOSIS — Z961 Presence of intraocular lens: Secondary | ICD-10-CM | POA: Diagnosis not present

## 2017-07-19 ENCOUNTER — Other Ambulatory Visit: Payer: Self-pay | Admitting: Internal Medicine

## 2017-07-19 DIAGNOSIS — F03918 Unspecified dementia, unspecified severity, with other behavioral disturbance: Secondary | ICD-10-CM

## 2017-07-19 DIAGNOSIS — F0391 Unspecified dementia with behavioral disturbance: Secondary | ICD-10-CM

## 2017-07-19 DIAGNOSIS — G4700 Insomnia, unspecified: Secondary | ICD-10-CM

## 2017-07-20 ENCOUNTER — Other Ambulatory Visit: Payer: Self-pay | Admitting: Internal Medicine

## 2017-07-23 ENCOUNTER — Other Ambulatory Visit: Payer: Self-pay | Admitting: Internal Medicine

## 2017-07-29 ENCOUNTER — Encounter: Payer: Self-pay | Admitting: Internal Medicine

## 2017-07-29 ENCOUNTER — Ambulatory Visit (INDEPENDENT_AMBULATORY_CARE_PROVIDER_SITE_OTHER): Payer: Medicare HMO | Admitting: Internal Medicine

## 2017-07-29 VITALS — BP 116/74 | HR 78 | Temp 97.9°F | Wt 179.0 lb

## 2017-07-29 DIAGNOSIS — J441 Chronic obstructive pulmonary disease with (acute) exacerbation: Secondary | ICD-10-CM | POA: Diagnosis not present

## 2017-07-29 DIAGNOSIS — H6123 Impacted cerumen, bilateral: Secondary | ICD-10-CM

## 2017-07-29 MED ORDER — ALBUTEROL SULFATE (2.5 MG/3ML) 0.083% IN NEBU
2.5000 mg | INHALATION_SOLUTION | Freq: Once | RESPIRATORY_TRACT | Status: AC
  Administered 2017-07-29: 2.5 mg via RESPIRATORY_TRACT

## 2017-07-29 MED ORDER — PROMETHAZINE-DM 6.25-15 MG/5ML PO SYRP: 5.0000 mL | ORAL_SOLUTION | Freq: Four times a day (QID) | ORAL | 0 refills | Status: DC | PRN

## 2017-07-29 MED ORDER — AZITHROMYCIN 250 MG PO TABS: ORAL_TABLET | ORAL | 0 refills | Status: DC

## 2017-07-29 MED ORDER — IPRATROPIUM BROMIDE 0.02 % IN SOLN
0.5000 mg | Freq: Once | RESPIRATORY_TRACT | Status: AC
  Administered 2017-07-29: 0.5 mg via RESPIRATORY_TRACT

## 2017-07-29 MED ORDER — PREDNISONE 10 MG PO TABS: ORAL_TABLET | ORAL | 0 refills | Status: DC

## 2017-07-29 NOTE — Addendum Note (Signed)
Addended by: Lurlean Nanny on: 07/29/2017 11:10 AM   Modules accepted: Orders

## 2017-07-29 NOTE — Patient Instructions (Signed)
Chronic Obstructive Pulmonary Disease Exacerbation  Chronic obstructive pulmonary disease (COPD) is a common lung problem. In COPD, the flow of air from the lungs is limited. COPD exacerbations are times that breathing gets worse and you need extra treatment. Without treatment they can be life threatening. If they happen often, your lungs can become more damaged. If your COPD gets worse, your doctor may treat you with:  ? Medicines.  ? Oxygen.  ? Different ways to clear your airway, such as using a mask.    Follow these instructions at home:  ? Do not smoke.  ? Avoid tobacco smoke and other things that bother your lungs.  ? If given, take your antibiotic medicine as told. Finish the medicine even if you start to feel better.  ? Only take medicines as told by your doctor.  ? Drink enough fluids to keep your pee (urine) clear or pale yellow (unless your doctor has told you not to).  ? Use a cool mist machine (vaporizer).  ? If you use oxygen or a machine that turns liquid medicine into a mist (nebulizer), continue to use them as told.  ? Keep up with shots (vaccinations) as told by your doctor.  ? Exercise regularly.  ? Eat healthy foods.  ? Keep all doctor visits as told.  Get help right away if:  ? You are very short of breath and it gets worse.  ? You have trouble talking.  ? You have bad chest pain.  ? You have blood in your spit (sputum).  ? You have a fever.  ? You keep throwing up (vomiting).  ? You feel weak, or you pass out (faint).  ? You feel confused.  ? You keep getting worse.  This information is not intended to replace advice given to you by your health care provider. Make sure you discuss any questions you have with your health care provider.  Document Released: 06/27/2011 Document Revised: 12/14/2015 Document Reviewed: 03/12/2013  Elsevier Interactive Patient Education ? 2017 Elsevier Inc.

## 2017-07-29 NOTE — Progress Notes (Signed)
HPI  Pt presents to the clinic today with c/o runny nose and cough. This started 3 days ago. He is blowing clear mucous out of his nose. The cough is productive of white/yellow mucous. He denies runny nose, nasal congestion or sore throat. He has not tried anything OTC. He does have a history of COPD. He has not had sick contacts that he is aware of.  Review of Systems      Past Medical History:  Diagnosis Date  . COPD (chronic obstructive pulmonary disease) ([place])   . Depression   . Erectile dysfunction    He's had a hx of   . Hypercholesterolemia   . Hypertension    Essential  . Lumbosacral disc disease    with chronic low back pain, has implanted nerve stimulator  . Peripheral neuropathy   . Presenile dementia   . Type 2 diabetes mellitus ([place]) 2010   takes oral meds    Family History  Problem Relation Age of Onset  . Multiple myeloma Mother   . Diabetes Mother   . Cancer Sister        lymphoma  . Diabetes Sister   . Stroke Neg Hx     Social History   Socioeconomic History  . Marital status: Married    Spouse name: Vaughan Basta  . Number of children: 3  . Years of education: 64  . Highest education level: Not on file  Social Needs  . Financial resource strain: Not on file  . Food insecurity - worry: Not on file  . Food insecurity - inability: Not on file  . Transportation needs - medical: Not on file  . Transportation needs - non-medical: Not on file  Occupational History  . Occupation: retired    Comment: retired  Tobacco Use  . Smoking status: Current Every Day Smoker    Packs/day: 1.00    Years: 40.00    Pack years: 40.00  . Smokeless tobacco: Never Used  Substance and Sexual Activity  . Alcohol use: No    Alcohol/week: 0.0 oz  . Drug use: No  . Sexual activity: Not on file  Other Topics Concern  . Not on file  Social History Narrative   Patient is right handed   Patient resides with wife,consumes 2-3 cups of caffeine daily    Allergies  Allergen  Reactions  . Codeine Hives and Itching     Constitutional: Denies headache, fatigue, fever or abrupt weight changes.  HEENT:  Positive runny nose. Denies eye redness, eye pain, pressure behind the eyes, facial pain, nasal congestion, ear pain, ringing in the ears, wax buildup, sore throat. Respiratory: Positive cough. Denies difficulty breathing or shortness of breath.  Cardiovascular: Denies chest pain, chest tightness, palpitations or swelling in the hands or feet.   No other specific complaints in a complete review of systems (except as listed in HPI above).  Objective:   BP 116/74   Pulse 78   Temp 97.9 F (36.6 C) (Oral)   Wt 179 lb (81.2 kg)   SpO2 94%   BMI 24.28 kg/m   Wt Readings from Last 3 Encounters:  07/29/17 179 lb (81.2 kg)  06/26/17 184 lb 8 oz (83.7 kg)  03/20/17 176 lb (79.8 kg)     General: Appears his stated age, ill appearing, in NAD. HEENT: Head: normal shape and size, no sinus tenderness noted; Ears: bilateral cerumen impaction; Nose: mucosa pink and moist, septum midline; Throat/Mouth: + PND. Teeth present, mucosa erythematous and moist, no  exudate noted, no lesions or ulcerations noted.  Neck: No cervical lymphadenopathy.  Pulmonary/Chest: Normal effort with scattered rhonchi and bilateral expiratory wheezing noted. No respiratory distress. No rales noted.       Assessment & Plan:   COPD Exacerbation:  Get some rest and drink plenty of water Duoneb given in office eRx for Azithromax x 5 days eRx for Promethazine DM cough syrup  Bilateral Cerumen Impaction:  Manual lavage by CMA Advised him to try Debrox 2 x week to prevent wax buildup  RTC as needed or if symptoms persist.   Webb Silversmith, NP

## 2017-08-01 ENCOUNTER — Encounter: Payer: Self-pay | Admitting: Internal Medicine

## 2017-08-05 DIAGNOSIS — M546 Pain in thoracic spine: Secondary | ICD-10-CM | POA: Diagnosis not present

## 2017-08-05 DIAGNOSIS — M545 Low back pain: Secondary | ICD-10-CM | POA: Diagnosis not present

## 2017-08-05 DIAGNOSIS — I10 Essential (primary) hypertension: Secondary | ICD-10-CM | POA: Diagnosis not present

## 2017-08-05 DIAGNOSIS — M542 Cervicalgia: Secondary | ICD-10-CM | POA: Diagnosis not present

## 2017-08-07 ENCOUNTER — Encounter: Payer: Self-pay | Admitting: Podiatry

## 2017-08-07 ENCOUNTER — Telehealth: Payer: Self-pay | Admitting: Podiatry

## 2017-08-07 DIAGNOSIS — M25571 Pain in right ankle and joints of right foot: Secondary | ICD-10-CM | POA: Diagnosis not present

## 2017-08-07 DIAGNOSIS — M216X1 Other acquired deformities of right foot: Secondary | ICD-10-CM | POA: Diagnosis not present

## 2017-08-07 DIAGNOSIS — M2011 Hallux valgus (acquired), right foot: Secondary | ICD-10-CM | POA: Diagnosis not present

## 2017-08-07 DIAGNOSIS — M21611 Bunion of right foot: Secondary | ICD-10-CM | POA: Diagnosis not present

## 2017-08-07 DIAGNOSIS — M21541 Acquired clubfoot, right foot: Secondary | ICD-10-CM | POA: Diagnosis not present

## 2017-08-07 DIAGNOSIS — I10 Essential (primary) hypertension: Secondary | ICD-10-CM | POA: Diagnosis not present

## 2017-08-07 DIAGNOSIS — M779 Enthesopathy, unspecified: Secondary | ICD-10-CM | POA: Diagnosis not present

## 2017-08-07 DIAGNOSIS — M7751 Other enthesopathy of right foot: Secondary | ICD-10-CM | POA: Diagnosis not present

## 2017-08-07 NOTE — Telephone Encounter (Signed)
I informed pt's wife, Vaughan Basta of Dr. Amalia Hailey orders to take only the Percocet at this time after surgery.

## 2017-08-07 NOTE — Telephone Encounter (Signed)
This is [place] calling from [facility] in [place]. Pt got a prescription for hydrocodone 7.5-325 from another doctor on the 4 th of this month and we received a script for percocet 5-325 today so this is like duplication of therapy. I just wanted to speak to somebody at your office regarding those two medications. Our phone number is [phone] if it is okay to fill it.

## 2017-08-07 NOTE — Telephone Encounter (Addendum)
I spoke with Dr. Amalia Hailey and he said pt had surgery today, so he would need the Percocet. I informed [place] [facility]iley wanted pt to take the percocet and not the hydrocodone, and I would inform pt.

## 2017-08-13 ENCOUNTER — Encounter: Payer: Self-pay | Admitting: Podiatry

## 2017-08-13 ENCOUNTER — Ambulatory Visit (INDEPENDENT_AMBULATORY_CARE_PROVIDER_SITE_OTHER): Payer: Medicare HMO | Admitting: Podiatry

## 2017-08-13 ENCOUNTER — Ambulatory Visit (INDEPENDENT_AMBULATORY_CARE_PROVIDER_SITE_OTHER): Payer: Medicare HMO

## 2017-08-13 DIAGNOSIS — Z9889 Other specified postprocedural states: Secondary | ICD-10-CM

## 2017-08-13 DIAGNOSIS — M2011 Hallux valgus (acquired), right foot: Secondary | ICD-10-CM

## 2017-08-16 NOTE — Progress Notes (Signed)
   Subjective:  Patient presents today status post right foot surgery. DOS: 08/07/17. He states he is doing well. He denies any new complaints at this time. Patient is here for further evaluation and treatment.    Past Medical History:  Diagnosis Date  . COPD (chronic obstructive pulmonary disease) ([place])   . Depression   . Erectile dysfunction    He's had a hx of   . Hypercholesterolemia   . Hypertension    Essential  . Lumbosacral disc disease    with chronic low back pain, has implanted nerve stimulator  . Peripheral neuropathy   . Presenile dementia   . Type 2 diabetes mellitus ([place]) 2010   takes oral meds      Objective/Physical Exam Neurovascular status intact.  Skin incisions appear to be well coapted with sutures and staples intact. No sign of infectious process noted. No dehiscence. No active bleeding noted. Moderate edema noted to the surgical extremity.  Radiographic Exam:  Orthopedic hardware and osteotomies sites appear to be stable with routine healing.  Assessment: 1. s/p right foot surgery. DOS: 08/07/17   Plan of Care:  1. Patient was evaluated. X-rays reviewed 2. Dressing changed. 3. Continue weightbearing in CAM boot. 4. Return to clinic in 1 week.   Edrick Kins, DPM [facility]  Dr. Edrick Kins, [place]                                        [place], [place] [address]                Office [phone]  Fax [phone]

## 2017-08-20 ENCOUNTER — Ambulatory Visit (INDEPENDENT_AMBULATORY_CARE_PROVIDER_SITE_OTHER): Payer: Medicare HMO | Admitting: Podiatry

## 2017-08-20 ENCOUNTER — Encounter: Payer: Self-pay | Admitting: Podiatry

## 2017-08-20 VITALS — BP 164/83 | HR 68 | Temp 96.8°F

## 2017-08-20 DIAGNOSIS — Z9889 Other specified postprocedural states: Secondary | ICD-10-CM

## 2017-08-20 DIAGNOSIS — L03115 Cellulitis of right lower limb: Secondary | ICD-10-CM

## 2017-08-20 MED ORDER — HYDROCODONE-ACETAMINOPHEN 7.5-325 MG PO TABS: 1.0000 | ORAL_TABLET | Freq: Four times a day (QID) | ORAL | 0 refills | Status: DC | PRN

## 2017-08-20 MED ORDER — SULFAMETHOXAZOLE-TRIMETHOPRIM 800-160 MG PO TABS: 1.0000 | ORAL_TABLET | Freq: Two times a day (BID) | ORAL | 0 refills | Status: DC

## 2017-08-21 ENCOUNTER — Telehealth: Payer: Self-pay | Admitting: Podiatry

## 2017-08-21 NOTE — Telephone Encounter (Signed)
[place] [facility] states the Medicare D will only pay for 7 days of the Norco. Dr. Amalia Hailey states change to dispense #28. I informed [place].

## 2017-08-21 NOTE — Telephone Encounter (Signed)
This is [place] calling from [place] in [place]. I need to speak to Dr. Amalia Hailey nurse in regards to pt. Alexander Booth has Medicare Part D and the quantity is more than what the insurance wants to pay at one time. They only cover 7 days at a time. Please give [place] a call back at [phone]. Thank you.

## 2017-08-24 NOTE — Progress Notes (Signed)
   Subjective:  Patient presents today status post right foot surgery. DOS: 08/07/17. He reports swelling and erythema of the right foot and toes. He states the incisions look well. He is requesting a refill on his pain medication. Patient is here for further evaluation and treatment.    Past Medical History:  Diagnosis Date  . COPD (chronic obstructive pulmonary disease) ([place])   . Depression   . Erectile dysfunction    He's had a hx of   . Hypercholesterolemia   . Hypertension    Essential  . Lumbosacral disc disease    with chronic low back pain, has implanted nerve stimulator  . Peripheral neuropathy   . Presenile dementia   . Type 2 diabetes mellitus ([place]) 2010   takes oral meds      Objective/Physical Exam Neurovascular status intact. Skin incisions appear to be well coapted with sutures and staples intact. No dehiscence. No active bleeding noted. Localized erythema and increased edema noted to surgical foot.   Assessment: 1. s/p right foot surgery. DOS: 08/07/17 2. Cellulitis right forefoot   Plan of Care:  1. Patient was evaluated.  2. Staples removed.  3. Prescription for Bactrim DS #20 provided to patient.  4. Refill prescription for Vicodin 7.5/325 mg #30 provided to patient.  5. Continue weightbearing in CAM boot.  6. Return to clinic in 2 weeks.  Edrick Kins, DPM [facility]  Dr. Edrick Kins, [place]                                        [place], [place] [address]                Office [phone]  Fax [phone]

## 2017-09-02 ENCOUNTER — Other Ambulatory Visit: Payer: Self-pay | Admitting: Internal Medicine

## 2017-09-02 DIAGNOSIS — R609 Edema, unspecified: Secondary | ICD-10-CM

## 2017-09-03 ENCOUNTER — Ambulatory Visit (INDEPENDENT_AMBULATORY_CARE_PROVIDER_SITE_OTHER): Payer: Medicare HMO | Admitting: Podiatry

## 2017-09-03 ENCOUNTER — Ambulatory Visit (INDEPENDENT_AMBULATORY_CARE_PROVIDER_SITE_OTHER): Payer: Medicare HMO

## 2017-09-03 DIAGNOSIS — Z9889 Other specified postprocedural states: Secondary | ICD-10-CM

## 2017-09-03 DIAGNOSIS — M7751 Other enthesopathy of right foot: Secondary | ICD-10-CM

## 2017-09-03 DIAGNOSIS — M2041 Other hammer toe(s) (acquired), right foot: Secondary | ICD-10-CM

## 2017-09-07 NOTE — Progress Notes (Signed)
   Subjective:  Patient presents today status post right foot surgery. DOS: 08/07/17. He states he is doing well overall. He reports that his foot is doing well and currently denies any pain. He denies any new complaints at this time. Patient is here for further evaluation and treatment.    Past Medical History:  Diagnosis Date  . COPD (chronic obstructive pulmonary disease) ([place])   . Depression   . Erectile dysfunction    He's had a hx of   . Hypercholesterolemia   . Hypertension    Essential  . Lumbosacral disc disease    with chronic low back pain, has implanted nerve stimulator  . Peripheral neuropathy   . Presenile dementia   . Type 2 diabetes mellitus ([place]) 2010   takes oral meds      Objective/Physical Exam Neurovascular status intact.  Skin incisions appear to be well coapted. No sign of infectious process noted. No dehiscence. No active bleeding noted. Moderate edema noted to the surgical extremity.  Radiographic Exam:  Orthopedic hardware and osteotomies sites appear to be stable with routine healing.  Assessment: 1. s/p right foot surgery. DOS: 08/07/17 2. Cellulitis right forefoot - resolved  Plan of Care:  1. Patient was evaluated. X-rays reviewed 2. Post op shoe dispensed. Weightbearing x 4 weeks.  3. Discontinue wearing CAM boot.  4. Diabetic shoes and insoles dispensed today.  5. Return to clinic in 4 weeks.    Edrick Kins, DPM [facility]  Dr. Edrick Kins, [place]                                        [place], [place] [address]                Office [phone]  Fax [phone]

## 2017-09-08 ENCOUNTER — Other Ambulatory Visit: Payer: Self-pay | Admitting: Internal Medicine

## 2017-09-15 ENCOUNTER — Telehealth: Payer: Self-pay | Admitting: Podiatry

## 2017-09-15 NOTE — Telephone Encounter (Signed)
Left message for pt to call to schedule an appt to pick up reordered diabetic shoes.

## 2017-10-01 ENCOUNTER — Telehealth: Payer: Self-pay | Admitting: Podiatry

## 2017-10-01 ENCOUNTER — Ambulatory Visit (INDEPENDENT_AMBULATORY_CARE_PROVIDER_SITE_OTHER): Payer: Medicare HMO

## 2017-10-01 ENCOUNTER — Ambulatory Visit: Payer: Medicare HMO

## 2017-10-01 ENCOUNTER — Ambulatory Visit (INDEPENDENT_AMBULATORY_CARE_PROVIDER_SITE_OTHER): Payer: Medicare HMO | Admitting: Podiatry

## 2017-10-01 DIAGNOSIS — M2041 Other hammer toe(s) (acquired), right foot: Secondary | ICD-10-CM

## 2017-10-01 DIAGNOSIS — M7751 Other enthesopathy of right foot: Secondary | ICD-10-CM

## 2017-10-01 DIAGNOSIS — L03115 Cellulitis of right lower limb: Secondary | ICD-10-CM

## 2017-10-01 DIAGNOSIS — E0842 Diabetes mellitus due to underlying condition with diabetic polyneuropathy: Secondary | ICD-10-CM

## 2017-10-01 DIAGNOSIS — Z9889 Other specified postprocedural states: Secondary | ICD-10-CM

## 2017-10-01 NOTE — Telephone Encounter (Signed)
Left message for pt that I have located his inserts and to call me and let me know if he could stop by the office today or tomorrow to pick up shoes and inserts.

## 2017-10-02 NOTE — Progress Notes (Signed)
   Subjective:  Patient presents today status post right foot surgery. DOS: 08/07/17. He reports continued swelling of the right foot. Standing or walking for long periods of time cause pain. Resting helps alleviate the symptoms. Patient is here for further evaluation and treatment.    Past Medical History:  Diagnosis Date  . COPD (chronic obstructive pulmonary disease) ([place])   . Depression   . Erectile dysfunction    He's had a hx of   . Hypercholesterolemia   . Hypertension    Essential  . Lumbosacral disc disease    with chronic low back pain, has implanted nerve stimulator  . Peripheral neuropathy   . Presenile dementia   . Type 2 diabetes mellitus ([place]) 2010   takes oral meds      Objective/Physical Exam Neurovascular status intact.  Skin incisions appear to be well coapted. No sign of infectious process noted. No dehiscence. No active bleeding noted. Moderate edema noted to the surgical extremity.  Radiographic Exam:  Orthopedic hardware and osteotomies sites appear to be stable with routine healing.  Assessment: 1. s/p right foot surgery. DOS: 08/07/17 - resolved   Plan of Care:  1. Patient was evaluated. X-rays reviewed 2. May resume full duty with no restriction. Continue wearing DM shoes and insoles.  3. DM shoes and insoles dispensed today.  4. Return to clinic as needed.    Edrick Kins, DPM [facility]  Dr. Edrick Kins, [place]                                        [place], [place] [address]                Office [phone]  Fax [phone]

## 2017-10-04 ENCOUNTER — Other Ambulatory Visit: Payer: Self-pay

## 2017-10-04 ENCOUNTER — Emergency Department (HOSPITAL_COMMUNITY): Payer: Medicare HMO

## 2017-10-04 ENCOUNTER — Emergency Department (HOSPITAL_COMMUNITY)
Admission: EM | Admit: 2017-10-04 | Discharge: 2017-10-05 | Disposition: A | Discharge status: Home / Self Care | Payer: Medicare HMO | Attending: Emergency Medicine | Admitting: Emergency Medicine

## 2017-10-04 ENCOUNTER — Encounter (HOSPITAL_COMMUNITY): Payer: Self-pay

## 2017-10-04 DIAGNOSIS — S80212A Abrasion, left knee, initial encounter: Secondary | ICD-10-CM | POA: Diagnosis not present

## 2017-10-04 DIAGNOSIS — Z79899 Other long term (current) drug therapy: Secondary | ICD-10-CM | POA: Diagnosis not present

## 2017-10-04 DIAGNOSIS — J449 Chronic obstructive pulmonary disease, unspecified: Secondary | ICD-10-CM | POA: Diagnosis not present

## 2017-10-04 DIAGNOSIS — Z96652 Presence of left artificial knee joint: Secondary | ICD-10-CM | POA: Diagnosis not present

## 2017-10-04 DIAGNOSIS — I10 Essential (primary) hypertension: Secondary | ICD-10-CM | POA: Diagnosis not present

## 2017-10-04 DIAGNOSIS — R109 Unspecified abdominal pain: Secondary | ICD-10-CM | POA: Diagnosis not present

## 2017-10-04 DIAGNOSIS — Y9389 Activity, other specified: Secondary | ICD-10-CM | POA: Diagnosis not present

## 2017-10-04 DIAGNOSIS — R0789 Other chest pain: Secondary | ICD-10-CM | POA: Diagnosis not present

## 2017-10-04 DIAGNOSIS — Z7984 Long term (current) use of oral hypoglycemic drugs: Secondary | ICD-10-CM | POA: Insufficient documentation

## 2017-10-04 DIAGNOSIS — S80211A Abrasion, right knee, initial encounter: Secondary | ICD-10-CM | POA: Insufficient documentation

## 2017-10-04 DIAGNOSIS — Y999 Unspecified external cause status: Secondary | ICD-10-CM | POA: Diagnosis not present

## 2017-10-04 DIAGNOSIS — F17210 Nicotine dependence, cigarettes, uncomplicated: Secondary | ICD-10-CM | POA: Insufficient documentation

## 2017-10-04 DIAGNOSIS — E119 Type 2 diabetes mellitus without complications: Secondary | ICD-10-CM | POA: Diagnosis not present

## 2017-10-04 DIAGNOSIS — F0390 Unspecified dementia without behavioral disturbance: Secondary | ICD-10-CM | POA: Insufficient documentation

## 2017-10-04 DIAGNOSIS — Z7982 Long term (current) use of aspirin: Secondary | ICD-10-CM | POA: Insufficient documentation

## 2017-10-04 DIAGNOSIS — R079 Chest pain, unspecified: Secondary | ICD-10-CM | POA: Diagnosis not present

## 2017-10-04 DIAGNOSIS — S299XXA Unspecified injury of thorax, initial encounter: Secondary | ICD-10-CM | POA: Diagnosis not present

## 2017-10-04 DIAGNOSIS — S064X0A Epidural hemorrhage without loss of consciousness, initial encounter: Secondary | ICD-10-CM | POA: Diagnosis not present

## 2017-10-04 DIAGNOSIS — S1980XA Other specified injuries of unspecified part of neck, initial encounter: Secondary | ICD-10-CM | POA: Diagnosis present

## 2017-10-04 DIAGNOSIS — M546 Pain in thoracic spine: Secondary | ICD-10-CM | POA: Diagnosis not present

## 2017-10-04 DIAGNOSIS — M549 Dorsalgia, unspecified: Secondary | ICD-10-CM | POA: Diagnosis not present

## 2017-10-04 DIAGNOSIS — S199XXA Unspecified injury of neck, initial encounter: Secondary | ICD-10-CM | POA: Diagnosis not present

## 2017-10-04 DIAGNOSIS — S0990XA Unspecified injury of head, initial encounter: Secondary | ICD-10-CM | POA: Diagnosis not present

## 2017-10-04 DIAGNOSIS — Y92410 Unspecified street and highway as the place of occurrence of the external cause: Secondary | ICD-10-CM | POA: Insufficient documentation

## 2017-10-04 DIAGNOSIS — S161XXA Strain of muscle, fascia and tendon at neck level, initial encounter: Secondary | ICD-10-CM | POA: Diagnosis not present

## 2017-10-04 DIAGNOSIS — S098XXA Other specified injuries of head, initial encounter: Secondary | ICD-10-CM | POA: Diagnosis not present

## 2017-10-04 DIAGNOSIS — S3991XA Unspecified injury of abdomen, initial encounter: Secondary | ICD-10-CM | POA: Diagnosis not present

## 2017-10-04 DIAGNOSIS — R51 Headache: Secondary | ICD-10-CM | POA: Diagnosis not present

## 2017-10-04 DIAGNOSIS — M7918 Myalgia, other site: Secondary | ICD-10-CM

## 2017-10-04 DIAGNOSIS — S301XXA Contusion of abdominal wall, initial encounter: Secondary | ICD-10-CM | POA: Diagnosis not present

## 2017-10-04 DIAGNOSIS — M542 Cervicalgia: Secondary | ICD-10-CM | POA: Diagnosis not present

## 2017-10-04 LAB — CBC
HEMATOCRIT: 40.6 % (ref 39.0–52.0)
Hemoglobin: 13.8 g/dL (ref 13.0–17.0)
MCH: 32.9 pg (ref 26.0–34.0)
MCHC: 34 g/dL (ref 30.0–36.0)
MCV: 96.7 fL (ref 78.0–100.0)
Platelets: 161 10*3/uL (ref 150–400)
RBC: 4.2 MIL/uL — ABNORMAL LOW (ref 4.22–5.81)
RDW: 12.9 % (ref 11.5–15.5)
WBC: 8.7 10*3/uL (ref 4.0–10.5)

## 2017-10-04 LAB — BASIC METABOLIC PANEL
ANION GAP: 11 (ref 5–15)
BUN: 11 mg/dL (ref 6–20)
CALCIUM: 9.3 mg/dL (ref 8.9–10.3)
CO2: 24 mmol/L (ref 22–32)
Chloride: 100 mmol/L — ABNORMAL LOW (ref 101–111)
Creatinine, Ser: 0.85 mg/dL (ref 0.61–1.24)
GFR calc Af Amer: >60 mL/min (ref >60)
GFR calc non Af Amer: >60 mL/min (ref >60)
GLUCOSE: 86 mg/dL (ref 65–99)
Potassium: 4 mmol/L (ref 3.5–5.1)
Sodium: 135 mmol/L (ref 135–145)

## 2017-10-04 MED ORDER — HYDROCODONE-ACETAMINOPHEN 5-325 MG PO TABS
1.0000 | ORAL_TABLET | Freq: Once | ORAL | Status: AC
  Administered 2017-10-04: 1 via ORAL
  Filled 2017-10-04: qty 1

## 2017-10-04 MED ORDER — IOPAMIDOL (ISOVUE-300) INJECTION 61%
INTRAVENOUS | Status: AC
  Administered 2017-10-04: 100 mL
  Filled 2017-10-04: qty 100

## 2017-10-04 MED ORDER — SODIUM CHLORIDE 0.9 % IV SOLN
INTRAVENOUS | Status: DC
  Administered 2017-10-04: 21:00:00 via INTRAVENOUS
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
## 2017-10-04 NOTE — ED Notes (Signed)
Nurse starting IV and will draw labs. 
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
## 2017-10-07 ENCOUNTER — Other Ambulatory Visit: Payer: Self-pay | Admitting: Internal Medicine

## 2017-10-07 NOTE — Telephone Encounter (Signed)
Last filled 06/24/2017 w/ 2 refills... Please advise

## 2017-10-27 ENCOUNTER — Other Ambulatory Visit: Payer: Self-pay | Admitting: Internal Medicine

## 2017-10-29 DIAGNOSIS — M542 Cervicalgia: Secondary | ICD-10-CM | POA: Diagnosis not present

## 2017-10-29 DIAGNOSIS — I10 Essential (primary) hypertension: Secondary | ICD-10-CM | POA: Diagnosis not present

## 2017-10-29 DIAGNOSIS — M545 Low back pain: Secondary | ICD-10-CM | POA: Diagnosis not present

## 2017-10-31 ENCOUNTER — Telehealth: Payer: Self-pay | Admitting: *Deleted

## 2017-10-31 MED ORDER — MELOXICAM 15 MG PO TABS: 15.0000 mg | ORAL_TABLET | Freq: Every day | ORAL | 2 refills | Status: DC

## 2017-10-31 NOTE — Telephone Encounter (Signed)
Refill request meloxicam. Dr. Amalia Hailey states refill +2, needs an appt.

## 2017-11-03 ENCOUNTER — Other Ambulatory Visit: Payer: Self-pay | Admitting: Internal Medicine

## 2017-11-17 ENCOUNTER — Other Ambulatory Visit: Payer: Self-pay | Admitting: Internal Medicine

## 2017-11-17 NOTE — Telephone Encounter (Signed)
Copied from [place] [id number]. Topic: Quick Communication - Rx Refill/Question >> Nov 17, 2017  1:25 PM Ahmed Prima L wrote: Medication: gabapentin (NEURONTIN) 300 MG capsule Has the patient contacted their pharmacy? No refills (Agent: If no, request that the patient contact the pharmacy for the refill.) Preferred Pharmacy (with phone number or street name): [facility], [place]: Please be advised that RX refills may take up to 3 business days. We ask that you follow-up with your pharmacy.

## 2017-11-17 NOTE — Telephone Encounter (Signed)
Pt requesting refill of Gabapentin(Neurontin) 300 mg capsule, last filled on 07/06/17 #270 with 2 refills.   LOV: 07/29/17 PCP: [facility]

## 2017-11-18 MED ORDER — GABAPENTIN 300 MG PO CAPS: 300.0000 mg | ORAL_CAPSULE | Freq: Two times a day (BID) | ORAL | 3 refills | Status: DC

## 2017-11-18 NOTE — Telephone Encounter (Signed)
[name] notified and voiced understanding refill done as requested.

## 2017-11-18 NOTE — Telephone Encounter (Signed)
I spoke with Mrs Alexander Booth and she referred me to Alexander Booth pts daughter (DPR signed) Alexander Booth said pt is out of the gabapentin 300 mg; pt has the gabapentin 400 mg. Pt takes gabapentin 300 mg bid in morning and at lunch and then takes 1200 mg at hs. Pt is out of 300 mg and request to be sent to [place] ASAP. Presently Alexander Booth is giving pt gabapentin 400 mg at morning and lunch.Please advise. Last seen 07/29/17. Last refilled the gabapentin 300 mg # 180 x 3 on 10/14/16.Please advise.

## 2017-11-20 ENCOUNTER — Other Ambulatory Visit: Payer: Self-pay

## 2018-01-05 ENCOUNTER — Other Ambulatory Visit: Payer: Self-pay

## 2018-01-05 DIAGNOSIS — F329 Major depressive disorder, single episode, unspecified: Secondary | ICD-10-CM

## 2018-01-05 DIAGNOSIS — F0391 Unspecified dementia with behavioral disturbance: Secondary | ICD-10-CM

## 2018-01-05 DIAGNOSIS — G4700 Insomnia, unspecified: Secondary | ICD-10-CM

## 2018-01-05 DIAGNOSIS — F03918 Unspecified dementia, unspecified severity, with other behavioral disturbance: Secondary | ICD-10-CM

## 2018-01-05 DIAGNOSIS — F32A Depression, unspecified: Secondary | ICD-10-CM

## 2018-01-05 MED ORDER — DONEPEZIL HCL 10 MG PO TABS: 10.0000 mg | ORAL_TABLET | Freq: Every morning | ORAL | 3 refills | Status: DC

## 2018-01-05 NOTE — Telephone Encounter (Signed)
Copied from [place] [id number]. Topic: General - Other >> Jan 05, 2018  9:23 AM Oneta Rack wrote: Osvaldo Human name: [name] (Daughter) Relation to pt: Call back number: [phone] Pharmacy: [facility] [place], [place] [phone] (Phone) [phone] (Fax)  Reason for call:  Daughter requesting 90 day supply of donepezil (ARICEPT) 10 MG tablet, daughter states neurologist is no longer prescribing and would like PCP to take over, please advise

## 2018-01-05 NOTE — Telephone Encounter (Signed)
aricept last refilled # 90 x 3 on 10/10/15 by Dr Star Age. Last acute visit 08/08/17; Last annual exam 03/20/17.

## 2018-01-12 ENCOUNTER — Other Ambulatory Visit: Payer: Self-pay | Admitting: Internal Medicine

## 2018-01-12 NOTE — Telephone Encounter (Signed)
Last filled 10/08/17 with 2 refills... Please advise

## 2018-01-18 ENCOUNTER — Other Ambulatory Visit: Payer: Self-pay | Admitting: Internal Medicine

## 2018-01-18 DIAGNOSIS — F03918 Unspecified dementia, unspecified severity, with other behavioral disturbance: Secondary | ICD-10-CM

## 2018-01-18 DIAGNOSIS — F0391 Unspecified dementia with behavioral disturbance: Secondary | ICD-10-CM

## 2018-01-18 DIAGNOSIS — G4700 Insomnia, unspecified: Secondary | ICD-10-CM

## 2018-01-19 ENCOUNTER — Other Ambulatory Visit: Payer: Self-pay | Admitting: Internal Medicine

## 2018-01-19 DIAGNOSIS — R609 Edema, unspecified: Secondary | ICD-10-CM

## 2018-01-26 ENCOUNTER — Other Ambulatory Visit: Payer: Self-pay | Admitting: Internal Medicine

## 2018-01-27 DIAGNOSIS — M545 Low back pain: Secondary | ICD-10-CM | POA: Diagnosis not present

## 2018-01-27 DIAGNOSIS — R030 Elevated blood-pressure reading, without diagnosis of hypertension: Secondary | ICD-10-CM | POA: Diagnosis not present

## 2018-01-27 DIAGNOSIS — M542 Cervicalgia: Secondary | ICD-10-CM | POA: Diagnosis not present

## 2018-01-29 ENCOUNTER — Ambulatory Visit (INDEPENDENT_AMBULATORY_CARE_PROVIDER_SITE_OTHER): Payer: Medicare HMO | Admitting: Internal Medicine

## 2018-01-29 ENCOUNTER — Encounter: Payer: Self-pay | Admitting: Internal Medicine

## 2018-01-29 VITALS — BP 136/82 | HR 83 | Temp 97.8°F | Wt 181.0 lb

## 2018-01-29 DIAGNOSIS — J449 Chronic obstructive pulmonary disease, unspecified: Secondary | ICD-10-CM

## 2018-01-29 DIAGNOSIS — F419 Anxiety disorder, unspecified: Secondary | ICD-10-CM

## 2018-01-29 DIAGNOSIS — R5383 Other fatigue: Secondary | ICD-10-CM

## 2018-01-29 DIAGNOSIS — G4700 Insomnia, unspecified: Secondary | ICD-10-CM

## 2018-01-29 DIAGNOSIS — R630 Anorexia: Secondary | ICD-10-CM | POA: Diagnosis not present

## 2018-01-29 DIAGNOSIS — R1084 Generalized abdominal pain: Secondary | ICD-10-CM

## 2018-01-29 DIAGNOSIS — R0602 Shortness of breath: Secondary | ICD-10-CM | POA: Diagnosis not present

## 2018-01-29 LAB — COMPREHENSIVE METABOLIC PANEL
ALBUMIN: 4.2 g/dL (ref 3.5–5.2)
ALK PHOS: 46 U/L (ref 39–117)
ALT: 7 U/L (ref 0–53)
AST: 13 U/L (ref 0–37)
BILIRUBIN TOTAL: 0.4 mg/dL (ref 0.2–1.2)
BUN: 16 mg/dL (ref 6–23)
CALCIUM: 9.6 mg/dL (ref 8.4–10.5)
CO2: 33 mEq/L — ABNORMAL HIGH (ref 19–32)
Chloride: 102 mEq/L (ref 96–112)
Creatinine, Ser: 0.98 mg/dL (ref 0.40–1.50)
GFR: 79.4 mL/min (ref >60.00)
Glucose, Bld: 114 mg/dL — ABNORMAL HIGH (ref 70–99)
Potassium: 4.4 mEq/L (ref 3.5–5.1)
Sodium: 141 mEq/L (ref 135–145)
Total Protein: 7 g/dL (ref 6.0–8.3)

## 2018-01-29 LAB — CBC
HCT: 37.8 % — ABNORMAL LOW (ref 39.0–52.0)
Hemoglobin: 12.9 g/dL — ABNORMAL LOW (ref 13.0–17.0)
MCHC: 34.2 g/dL (ref 30.0–36.0)
MCV: 96.9 fl (ref 78.0–100.0)
Platelets: 182 10*3/uL (ref 150.0–400.0)
RBC: 3.9 Mil/uL — AB (ref 4.22–5.81)
RDW: 13.1 % (ref 11.5–15.5)
WBC: 6.9 10*3/uL (ref 4.0–10.5)

## 2018-01-29 LAB — TSH: TSH: 1.85 u[IU]/mL (ref 0.35–4.50)

## 2018-01-29 LAB — VITAMIN B12: VITAMIN B 12: 255 pg/mL (ref 211–911)

## 2018-01-29 LAB — VITAMIN D 25 HYDROXY (VIT D DEFICIENCY, FRACTURES): VITD: 34.34 ng/mL (ref 30.00–100.00)

## 2018-01-29 MED ORDER — CITALOPRAM HYDROBROMIDE 10 MG PO TABS: 10.0000 mg | ORAL_TABLET | Freq: Every day | ORAL | 3 refills | Status: DC

## 2018-01-29 MED ORDER — FLUTICASONE FUROATE-VILANTEROL 100-25 MCG/INH IN AEPB: 1.0000 | INHALATION_SPRAY | Freq: Every day | RESPIRATORY_TRACT | 2 refills | Status: DC

## 2018-01-29 NOTE — Progress Notes (Signed)
Subjective:    Patient ID: Alexander Booth, male    DOB: February 07, 1946, 72 y.o.   MRN: [id number]  HPI  Pt presents to the clinic today with multiple complaints.  He has been very fatigued lately. He has not increased his activity. He is having trouble sleeping due to increased anxiety. He reports he can not fall asleep or stay asleep. He feels like he just can't turn his mind off. He is not napping during the day. He has been having intermittent chills and sweats to the point where he has to change his clothes. This can occur during the day or night.  He also reports increased hoarseness and shortness of breath. He noticed this 1 week ago. He denies runny nose, nasal congestion, sore throat or cough. The shortness of breath is worse with exertion. He has had some chest tightness, but no chest pain. He has a history of COPD. He is taking Flovent as prescribed and uses his Albuterol inhaler multiple times per day. He does not smoke.  He also reports decreased appetite and abdominal pain. This has been going on for a few weeks. He denies concern for weight loss. The abdominal pain is located around the belly button. It is not worse with food. He denies nausea, vomiting, diarrhea, constipation or blood in his stool. He denies urinary complaints. He has not taken any medications other than what has been prescribed.  Review of Systems      Past Medical History:  Diagnosis Date  . COPD (chronic obstructive pulmonary disease) ([place])   . Depression   . Erectile dysfunction    He's had a hx of   . Hypercholesterolemia   . Hypertension    Essential  . Lumbosacral disc disease    with chronic low back pain, has implanted nerve stimulator  . Peripheral neuropathy   . Presenile dementia   . Type 2 diabetes mellitus ([place]) 2010   takes oral meds    Current Outpatient Medications  Medication Sig Dispense Refill  . albuterol (VENTOLIN HFA) 108 (90 Base) MCG/ACT inhaler INHALE 2 PUFFS INTO THE  LUNGS EVERY 6 (SIX) HOURS AS NEEDED FOR WHEEZING OR SHORTNESS OF BREATH. 18 g 2  . aspirin 81 MG chewable tablet Chew 81 mg by mouth daily.     . bisacodyl (DULCOLAX) 5 MG EC tablet Take 5 mg by mouth daily as needed for mild constipation.     . divalproex (DEPAKOTE ER) 500 MG 24 hr tablet Take 2 tablets (1,000 mg total) by mouth daily. 180 tablet 1  . donepezil (ARICEPT) 10 MG tablet Take 1 tablet (10 mg total) by mouth every morning. 90 tablet 3  . FLOVENT HFA 110 MCG/ACT inhaler INHALE 1 PUFF INTO THE LUNGS 2 (TWO) TIMES DAILY. 12 Inhaler 3  . gabapentin (NEURONTIN) 300 MG capsule Take 1 capsule (300 mg total) by mouth 2 (two) times daily. 180 capsule 3  . gabapentin (NEURONTIN) 400 MG capsule TAKE 3 CAPSULES AT BEDTIME AND MAY REPEAT DOSE ONE TIME IF NEEDED 270 capsule 2  . hydrochlorothiazide (HYDRODIURIL) 25 MG tablet TAKE 2 TABLETS EVERY DAY 180 tablet 0  . HYDROcodone-acetaminophen (NORCO) 10-325 MG tablet Take 1 tablet by mouth every 6 (six) hours.    Marland Kitchen LORazepam (ATIVAN) 0.5 MG tablet TAKE 1 TABLET BY MOUTH EVERY DAY AS NEEDED FOR ANXIETY 20 tablet 2  . meloxicam (MOBIC) 15 MG tablet Take 1 tablet (15 mg total) by mouth daily. 60 tablet 2  . memantine (  NAMENDA XR) 7 MG CP24 24 hr capsule TAKE 1 CAPSULE (7 MG TOTAL) BY MOUTH EVERY MORNING. 30 capsule 5  . metFORMIN (GLUCOPHAGE) 500 MG tablet TAKE 1 TABLET TWICE DAILY 180 tablet 0  . mirtazapine (REMERON) 45 MG tablet TAKE 1 TABLET AT BEDTIME 90 tablet 0  . nitroGLYCERIN (NITROSTAT) 0.4 MG SL tablet Place 0.4 mg under the tongue every 5 (five) minutes as needed for chest pain.     . NONFORMULARY OR COMPOUNDED ITEM [facility]:  Peripheral Neuropathy cream - Bupivacaine 1%, Doxepin 3%, Gabapentin 6%, Pentoxifylline 3%, Topiramate 1%, apply to affected area 1-2 grams 3-4 times daily (Patient taking differently: Apply 1 application topically daily as needed (PAIN). [facility]:  Peripheral Neuropathy cream - Bupivacaine 1%, Doxepin  3%, Gabapentin 6%, Pentoxifylline 3%, Topiramate 1%, apply to affected area 1-2 grams 3-4 times daily) 120 each 3  . promethazine-dextromethorphan (PROMETHAZINE-DM) 6.25-15 MG/5ML syrup Take 5 mLs by mouth 4 (four) times daily as needed for cough. 118 mL 0  . simvastatin (ZOCOR) 40 MG tablet TAKE 1 TABLET THREE TIMES WEEKLY ON MONDAY, WEDNESDAY AND FRIDAY 39 tablet 0  . tiZANidine (ZANAFLEX) 4 MG tablet Take 4 mg by mouth 3 (three) times daily.      No current facility-administered medications for this visit.     Allergies  Allergen Reactions  . Codeine Hives and Itching    Family History  Problem Relation Age of Onset  . Multiple myeloma Mother   . Diabetes Mother   . Cancer Sister        lymphoma  . Diabetes Sister   . Stroke Neg Hx     Social History   Socioeconomic History  . Marital status: Married    Spouse name: Vaughan Basta  . Number of children: 3  . Years of education: 39  . Highest education level: Not on file  Occupational History  . Occupation: retired    Comment: retired  Scientific laboratory technician  . Financial resource strain: Not on file  . Food insecurity:    Worry: Not on file    Inability: Not on file  . Transportation needs:    Medical: Not on file    Non-medical: Not on file  Tobacco Use  . Smoking status: Current Every Day Smoker    Packs/day: 1.00    Years: 40.00    Pack years: 40.00  . Smokeless tobacco: Never Used  Substance and Sexual Activity  . Alcohol use: No    Alcohol/week: 0.0 oz  . Drug use: No  . Sexual activity: Not on file  Lifestyle  . Physical activity:    Days per week: Not on file    Minutes per session: Not on file  . Stress: Not on file  Relationships  . Social connections:    Talks on phone: Not on file    Gets together: Not on file    Attends religious service: Not on file    Active member of club or organization: Not on file    Attends meetings of clubs or organizations: Not on file    Relationship status: Not on file  .  Intimate partner violence:    Fear of current or ex partner: Not on file    Emotionally abused: Not on file    Physically abused: Not on file    Forced sexual activity: Not on file  Other Topics Concern  . Not on file  Social History Narrative   Patient is right handed   Patient  resides with wife,consumes 2-3 cups of caffeine daily     Constitutional: Pt reports fatigue. Denies fever, malaise, headache or abrupt weight changes.  HEENT: Pt reports hoarseness. Denies eye pain, eye redness, ear pain, ringing in the ears, wax buildup, runny nose, nasal congestion, bloody nose, or sore throat. Respiratory: Pt reports shortness of breath. Denies difficulty breathing, cough or sputum production.   Cardiovascular: Pt reports chest tightness. Denies chest pain, palpitations or swelling in the hands or feet.  Gastrointestinal: Pt reports decreased appetite, abdominal pain. Denies bloating, constipation, diarrhea or blood in the stool.  GU: Denies urgency, frequency, pain with urination, burning sensation, blood in urine, odor or discharge. Musculoskeletal: Pt reports chronic back pain. Denies decrease in range of motion, difficulty with gait, muscle pain or joint swelling.  Skin: Denies redness, rashes, lesions or ulcercations.  Neurological: Pt reports difficulty with memory. Denies dizziness, difficulty with speech or problems with balance and coordination.  Psych: Pt reports anxiety and depression. Denies SI/HI.  No other specific complaints in a complete review of systems (except as listed in HPI above).  Objective:   Physical Exam   BP 136/82   Pulse 83   Temp 97.8 F (36.6 C) (Oral)   Wt 181 lb (82.1 kg)   SpO2 97%   BMI 24.55 kg/m  Wt Readings from Last 3 Encounters:  01/29/18 181 lb (82.1 kg)  10/04/17 185 lb (83.9 kg)  07/29/17 179 lb (81.2 kg)    General: Appears his stated age, in NAD. HEENT: Head: normal shape and size; Nose: mucosa pink and moist, septum midline;  Throat/Mouth: Teeth present, mucosa erythematous and moist, + PND, no exudate, lesions or ulcerations noted.  Neck:  Neck supple, trachea midline. No masses, lumps present.  Cardiovascular: Normal rate and rhythm. S1,S2 noted.  No murmur, rubs or gallops noted.  Pulmonary/Chest: Normal effort and positive vesicular breath sounds. No respiratory distress. No wheezes, rales or ronchi noted.  Abdomen: Soft and generally tender. Normal bowel sounds. No distention or masses noted.  Musculoskeletal: Gait slow and steady. Neurological: Alert and oriented to person and place. Psychiatric: Mood and affect mildly flat. Behavior is normal. Judgment and thought content normal.     BMET    Component Value Date/Time   NA 135 10/04/2017 2102   NA 141 10/11/2014 1122   K 4.0 10/04/2017 2102   CL 100 (L) 10/04/2017 2102   CO2 24 10/04/2017 2102   GLUCOSE 86 10/04/2017 2102   BUN 11 10/04/2017 2102   BUN 11 10/11/2014 1122   CREATININE 0.85 10/04/2017 2102   CREATININE 0.72 03/26/2013 1608   CALCIUM 9.3 10/04/2017 2102   GFRNONAA >60 10/04/2017 2102   GFRAA >60 10/04/2017 2102    Lipid Panel     Component Value Date/Time   CHOL 169 03/20/2017 1016   TRIG 183.0 (H) 03/20/2017 1016   HDL 34.90 (L) 03/20/2017 1016   CHOLHDL 5 03/20/2017 1016   VLDL 36.6 03/20/2017 1016   LDLCALC 97 03/20/2017 1016    CBC    Component Value Date/Time   WBC 8.7 10/04/2017 2102   RBC 4.20 (L) 10/04/2017 2102   HGB 13.8 10/04/2017 2102   HCT 40.6 10/04/2017 2102   PLT 161 10/04/2017 2102   MCV 96.7 10/04/2017 2102   MCH 32.9 10/04/2017 2102   MCHC 34.0 10/04/2017 2102   RDW 12.9 10/04/2017 2102   RDW 13.5 10/11/2014 1122   LYMPHSABS 0.8 12/17/2016 0455   LYMPHSABS 1.3 10/11/2014 1122  MONOABS 0.3 12/17/2016 0455   EOSABS 0.0 12/17/2016 0455   EOSABS 0.0 10/11/2014 1122   BASOSABS 0.0 12/17/2016 0455   BASOSABS 0.0 10/11/2014 1122    Hgb A1C Lab Results  Component Value Date   HGBA1C 6.2  03/20/2017            Assessment & Plan:   Fatigue, Anxiety and Insomnia:  Will start Celexa 10 mg daily, eRx sent to pharmacy If continues to have trouble sleeping, consider Trazadone Will check CBC, CMET, TSH, Vit D and B12 today  COPD, Shortness of Breath:  No clear exacerbation Stop Flovent Will start Breo, eRx sent to pharmacy Continue Albuterol Consider chest xray, spirometry vs referral to pulmonology if symptoms persist or worsen  Abdominal Pain, Decreased Appetite:  ? R/t anxiety Will check CBC and CMET today Encouraged bland foods, supplement with Boost ? May need to stop Meloxicam May need referral to GI  Will follow up after labs, return precautions discussed Webb Silversmith, NP

## 2018-01-29 NOTE — Patient Instructions (Signed)

## 2018-02-05 ENCOUNTER — Telehealth: Payer: Self-pay | Admitting: Internal Medicine

## 2018-02-05 NOTE — Telephone Encounter (Signed)
Daily in am

## 2018-02-05 NOTE — Telephone Encounter (Signed)
Daughter [name] is aware ok per [facility]

## 2018-02-05 NOTE — Telephone Encounter (Signed)
Copied from [place] [phone]. Topic: Inquiry >> Feb 03, 2018  5:26 PM Oliver Pila B wrote: Reason for CRM: [name] called back on behalf of the pt, a message was left by Threasa Beards the pt states  >> Feb 05, 2018  8:36 AM Ivar Drape wrote: Patient's daughter, Clarene Critchley, wants to know when the best time to give her father his citalopram (CELEXA) 10 MG tablet medication.  Please advise before 4pm. Try her home number first [phone].  If she's not there call her cell at [phone].

## 2018-02-08 ENCOUNTER — Encounter: Payer: Self-pay | Admitting: Internal Medicine

## 2018-02-11 ENCOUNTER — Ambulatory Visit (INDEPENDENT_AMBULATORY_CARE_PROVIDER_SITE_OTHER): Payer: Medicare HMO

## 2018-02-11 DIAGNOSIS — R5383 Other fatigue: Secondary | ICD-10-CM

## 2018-02-11 MED ORDER — CYANOCOBALAMIN 1000 MCG/ML IJ SOLN
1000.0000 ug | Freq: Once | INTRAMUSCULAR | Status: AC
Start: 2018-02-11 — End: 2018-02-11
  Administered 2018-02-11: 1000 ug via INTRAMUSCULAR

## 2018-02-11 NOTE — Progress Notes (Signed)
Per orders of Webb Silversmith NP, injection of Vitamin B 12 given by Ozzie Hoyle. Patient tolerated injection well.

## 2018-03-17 ENCOUNTER — Ambulatory Visit (INDEPENDENT_AMBULATORY_CARE_PROVIDER_SITE_OTHER): Payer: Medicare HMO | Admitting: *Deleted

## 2018-03-17 DIAGNOSIS — R5383 Other fatigue: Secondary | ICD-10-CM

## 2018-03-17 MED ORDER — CYANOCOBALAMIN 1000 MCG/ML IJ SOLN
1000.0000 ug | Freq: Once | INTRAMUSCULAR | Status: AC
  Administered 2018-03-17: 1000 ug via INTRAMUSCULAR

## 2018-03-17 MED ORDER — CYANOCOBALAMIN 1000 MCG/ML IJ SOLN: 1000.0000 ug | Freq: Once | INTRAMUSCULAR | Status: DC

## 2018-03-23 ENCOUNTER — Other Ambulatory Visit: Payer: Self-pay | Admitting: Internal Medicine

## 2018-03-23 DIAGNOSIS — R609 Edema, unspecified: Secondary | ICD-10-CM

## 2018-03-30 ENCOUNTER — Telehealth: Payer: Self-pay | Admitting: Internal Medicine

## 2018-03-30 DIAGNOSIS — M5416 Radiculopathy, lumbar region: Secondary | ICD-10-CM | POA: Diagnosis not present

## 2018-03-30 DIAGNOSIS — M545 Low back pain: Secondary | ICD-10-CM | POA: Diagnosis not present

## 2018-03-30 DIAGNOSIS — M25551 Pain in right hip: Secondary | ICD-10-CM | POA: Diagnosis not present

## 2018-03-30 DIAGNOSIS — M542 Cervicalgia: Secondary | ICD-10-CM | POA: Diagnosis not present

## 2018-03-30 DIAGNOSIS — M533 Sacrococcygeal disorders, not elsewhere classified: Secondary | ICD-10-CM | POA: Diagnosis not present

## 2018-03-30 NOTE — Telephone Encounter (Signed)
Copied from [place] [phone]. Topic: Quick Communication - Rx Refill/Question >> Mar 30, 2018  8:32 AM Scherrie Gerlach wrote: Medication: citalopram (CELEXA) 10 MG tablet  Daughter states pt is doing really good on this new med, and requesting refill 90 days sent to [facility], [place] [phone] (Phone) [phone] (Fax)

## 2018-03-30 NOTE — Telephone Encounter (Signed)
Call to daughter- per medication list- it looks like the Rx was sent in with 1 year refills. She will check with [employer]- if they do not have the Rx in the system that way- she will notify the office

## 2018-04-02 ENCOUNTER — Other Ambulatory Visit: Payer: Self-pay | Admitting: Internal Medicine

## 2018-04-21 ENCOUNTER — Ambulatory Visit (INDEPENDENT_AMBULATORY_CARE_PROVIDER_SITE_OTHER): Payer: Medicare HMO | Admitting: Emergency Medicine

## 2018-04-21 ENCOUNTER — Other Ambulatory Visit: Payer: Self-pay | Admitting: Internal Medicine

## 2018-04-21 DIAGNOSIS — Z23 Encounter for immunization: Secondary | ICD-10-CM

## 2018-04-21 DIAGNOSIS — R5383 Other fatigue: Secondary | ICD-10-CM | POA: Diagnosis not present

## 2018-04-21 MED ORDER — CYANOCOBALAMIN 1000 MCG/ML IJ SOLN
1000.0000 ug | Freq: Once | INTRAMUSCULAR | Status: AC
  Administered 2018-04-21: 1000 ug via INTRAMUSCULAR

## 2018-04-21 NOTE — Progress Notes (Signed)
Per orders of [facility] , injection of b12 given by [employer]	[place]. Patient tolerated injection well. administered in left deltoid

## 2018-04-27 ENCOUNTER — Other Ambulatory Visit: Payer: Self-pay | Admitting: Internal Medicine

## 2018-04-27 NOTE — Telephone Encounter (Signed)
Last filled 01/12/2018 w/ 2 refills... Please advise

## 2018-04-29 ENCOUNTER — Other Ambulatory Visit: Payer: Self-pay | Admitting: Internal Medicine

## 2018-05-04 ENCOUNTER — Ambulatory Visit: Payer: Medicare HMO | Admitting: Internal Medicine

## 2018-05-14 ENCOUNTER — Encounter: Payer: Self-pay | Admitting: Internal Medicine

## 2018-05-14 ENCOUNTER — Ambulatory Visit (INDEPENDENT_AMBULATORY_CARE_PROVIDER_SITE_OTHER): Payer: Medicare HMO | Admitting: Internal Medicine

## 2018-05-14 ENCOUNTER — Ambulatory Visit (INDEPENDENT_AMBULATORY_CARE_PROVIDER_SITE_OTHER)
Admission: RE | Admit: 2018-05-14 | Discharge: 2018-05-14 | Disposition: A | Discharge status: Home / Self Care | Payer: Medicare HMO | Source: Ambulatory Visit | Attending: Internal Medicine | Admitting: Internal Medicine

## 2018-05-14 VITALS — BP 132/74 | HR 82 | Temp 97.9°F | Ht 72.0 in | Wt 184.0 lb

## 2018-05-14 DIAGNOSIS — F0281 Dementia in other diseases classified elsewhere with behavioral disturbance: Secondary | ICD-10-CM

## 2018-05-14 DIAGNOSIS — G63 Polyneuropathy in diseases classified elsewhere: Secondary | ICD-10-CM | POA: Diagnosis not present

## 2018-05-14 DIAGNOSIS — I10 Essential (primary) hypertension: Secondary | ICD-10-CM | POA: Diagnosis not present

## 2018-05-14 DIAGNOSIS — F339 Major depressive disorder, recurrent, unspecified: Secondary | ICD-10-CM

## 2018-05-14 DIAGNOSIS — M25551 Pain in right hip: Secondary | ICD-10-CM

## 2018-05-14 DIAGNOSIS — M549 Dorsalgia, unspecified: Secondary | ICD-10-CM

## 2018-05-14 DIAGNOSIS — Z0000 Encounter for general adult medical examination without abnormal findings: Secondary | ICD-10-CM | POA: Diagnosis not present

## 2018-05-14 DIAGNOSIS — G300 Alzheimer's disease with early onset: Secondary | ICD-10-CM | POA: Diagnosis not present

## 2018-05-14 DIAGNOSIS — G479 Sleep disorder, unspecified: Secondary | ICD-10-CM

## 2018-05-14 DIAGNOSIS — G8929 Other chronic pain: Secondary | ICD-10-CM

## 2018-05-14 DIAGNOSIS — J438 Other emphysema: Secondary | ICD-10-CM | POA: Diagnosis not present

## 2018-05-14 DIAGNOSIS — E785 Hyperlipidemia, unspecified: Secondary | ICD-10-CM | POA: Diagnosis not present

## 2018-05-14 DIAGNOSIS — E118 Type 2 diabetes mellitus with unspecified complications: Secondary | ICD-10-CM

## 2018-05-14 DIAGNOSIS — M160 Bilateral primary osteoarthritis of hip: Secondary | ICD-10-CM | POA: Diagnosis not present

## 2018-05-14 DIAGNOSIS — F02818 Dementia in other diseases classified elsewhere, unspecified severity, with other behavioral disturbance: Secondary | ICD-10-CM

## 2018-05-14 MED ORDER — MELOXICAM 15 MG PO TABS: 15.0000 mg | ORAL_TABLET | Freq: Every day | ORAL | 3 refills | Status: DC

## 2018-05-14 NOTE — Progress Notes (Signed)
HPI:  Pt presents to the clinic today for his Medicare Wellness Exam. He is also due to follow up chronic conditions.  COPD: He reports chronic cough and shortness of breath. He does feel like this has improved with Breo but reports this medicine is very expensive. He has an Albuterol inhaler that he uses prn. There are no PFT's on file.  Depression: Chronic but stable on Celexa. He takes Ativan as needed for agitation. He takes Remeron nightly for sleep. He denies SI/HI.  HLD: His last LDL was 97, 02/2017. He denies myalgias on Simvastatin. He consumes a low fat diet.  HTN: His BP today is 132/74. He is taking HCTZ as prescribed. ECG from 09/2017 rviewed.  Chronic Low Back Pain with Neuropathy: He follows with Dr. [name]. He is having pain in the right hip at the sight of his nerve pain stimulator. He reports the pain causes him to have frequent falls and trouble sleeping. He is taking Meloxicam, Zanaflex, Vicodin and Gabapentin as prescribed.  Dementia: Persistent memory issues with agitation at times. He is taking Aricept, Namenda, and Depakote as prescribed. He takes Ativan as needed for agitation. He no longer follows with neurology.  DM 2: His last A1C was 6.2%, 02/2017. He does not check his sugars. He takes Metformin as prescribed and denies hypoglycemia. He does not check his feet regularly. He gets an eye exam yearly.  Past Medical History:  Diagnosis Date  . COPD (chronic obstructive pulmonary disease) ([place])   . Depression   . Erectile dysfunction    He's had a hx of   . Hypercholesterolemia   . Hypertension    Essential  . Lumbosacral disc disease    with chronic low back pain, has implanted nerve stimulator  . Peripheral neuropathy   . Presenile dementia   . Type 2 diabetes mellitus ([place]) 2010   takes oral meds    Current Outpatient Medications  Medication Sig Dispense Refill  . albuterol (VENTOLIN HFA) 108 (90 Base) MCG/ACT inhaler INHALE 2 PUFFS INTO THE LUNGS EVERY  6 (SIX) HOURS AS NEEDED FOR WHEEZING OR SHORTNESS OF BREATH. 18 g 2  . aspirin 81 MG chewable tablet Chew 81 mg by mouth daily.     . bisacodyl (DULCOLAX) 5 MG EC tablet Take 5 mg by mouth daily as needed for mild constipation.     Marland Kitchen BREO ELLIPTA 100-25 MCG/INH AEPB TAKE 1 PUFF BY MOUTH EVERY DAY 60 each 2  . citalopram (CELEXA) 10 MG tablet Take 1 tablet (10 mg total) by mouth daily. 90 tablet 3  . cyanocobalamin (,VITAMIN B-12,) 1000 MCG/ML injection Inject 1,000 mcg into the muscle every 30 (thirty) days.    . divalproex (DEPAKOTE ER) 500 MG 24 hr tablet TAKE 2 TABLETS (1,000 MG TOTAL) BY MOUTH DAILY. 180 tablet 0  . donepezil (ARICEPT) 10 MG tablet Take 1 tablet (10 mg total) by mouth every morning. 90 tablet 3  . gabapentin (NEURONTIN) 300 MG capsule Take 1 capsule (300 mg total) by mouth 2 (two) times daily. 180 capsule 3  . gabapentin (NEURONTIN) 400 MG capsule TAKE 3 CAPSULES AT BEDTIME AND MAY REPEAT DOSE ONE TIME IF NEEDED 270 capsule 1  . hydrochlorothiazide (HYDRODIURIL) 25 MG tablet TAKE 2 TABLETS EVERY DAY 180 tablet 0  . HYDROcodone-acetaminophen (NORCO) 10-325 MG tablet Take 1 tablet by mouth every 6 (six) hours.    Marland Kitchen LORazepam (ATIVAN) 0.5 MG tablet TAKE 1 TABLET BY MOUTH EVERY DAY AS NEEDED FOR ANXIETY 20  tablet 2  . meloxicam (MOBIC) 15 MG tablet Take 1 tablet (15 mg total) by mouth daily. 60 tablet 2  . memantine (NAMENDA XR) 7 MG CP24 24 hr capsule TAKE 1 CAPSULE (7 MG TOTAL) BY MOUTH EVERY MORNING. 30 capsule 5  . metFORMIN (GLUCOPHAGE) 500 MG tablet TAKE 1 TABLET TWICE DAILY 180 tablet 0  . mirtazapine (REMERON) 45 MG tablet TAKE 1 TABLET AT BEDTIME 90 tablet 0  . nitroGLYCERIN (NITROSTAT) 0.4 MG SL tablet Place 0.4 mg under the tongue every 5 (five) minutes as needed for chest pain.     . NONFORMULARY OR COMPOUNDED ITEM [facility]:  Peripheral Neuropathy cream - Bupivacaine 1%, Doxepin 3%, Gabapentin 6%, Pentoxifylline 3%, Topiramate 1%, apply to affected area 1-2  grams 3-4 times daily (Patient taking differently: Apply 1 application topically daily as needed (PAIN). [facility]:  Peripheral Neuropathy cream - Bupivacaine 1%, Doxepin 3%, Gabapentin 6%, Pentoxifylline 3%, Topiramate 1%, apply to affected area 1-2 grams 3-4 times daily) 120 each 3  . simvastatin (ZOCOR) 40 MG tablet TAKE 1 TABLET THREE TIMES WEEKLY ON MONDAY, WEDNESDAY AND FRIDAY 39 tablet 0  . tiZANidine (ZANAFLEX) 4 MG tablet Take 4 mg by mouth 3 (three) times daily.      No current facility-administered medications for this visit.     Allergies  Allergen Reactions  . Codeine Hives and Itching    Family History  Problem Relation Age of Onset  . Multiple myeloma Mother   . Diabetes Mother   . Cancer Sister        lymphoma  . Diabetes Sister   . Stroke Neg Hx     Social History   Socioeconomic History  . Marital status: Married    Spouse name: Vaughan Basta  . Number of children: 3  . Years of education: 68  . Highest education level: Not on file  Occupational History  . Occupation: retired    Comment: retired  Scientific laboratory technician  . Financial resource strain: Not on file  . Food insecurity:    Worry: Not on file    Inability: Not on file  . Transportation needs:    Medical: Not on file    Non-medical: Not on file  Tobacco Use  . Smoking status: Current Every Day Smoker    Packs/day: 1.00    Years: 40.00    Pack years: 40.00  . Smokeless tobacco: Never Used  Substance and Sexual Activity  . Alcohol use: No    Alcohol/week: 0.0 standard drinks  . Drug use: No  . Sexual activity: Not on file  Lifestyle  . Physical activity:    Days per week: Not on file    Minutes per session: Not on file  . Stress: Not on file  Relationships  . Social connections:    Talks on phone: Not on file    Gets together: Not on file    Attends religious service: Not on file    Active member of club or organization: Not on file    Attends meetings of clubs or organizations: Not on file     Relationship status: Not on file  . Intimate partner violence:    Fear of current or ex partner: Not on file    Emotionally abused: Not on file    Physically abused: Not on file    Forced sexual activity: Not on file  Other Topics Concern  . Not on file  Social History Narrative   Patient is right handed  Patient resides with wife,consumes 2-3 cups of caffeine daily    Hospitiliaztions: None  Health Maintenance:    Flu: 04/2018  Tetanus: 08/2012  Pneumovax: 04/2018  Prevnar: 09/2015  Zostavax: 09/2012  PSA: 02/2017  Bone Density: never  Colon Screening: 02/2015  Eye Doctor: annually  Dental Exam: annually   Providers:   PCP: Webb Silversmith, NP-C  Podiatry: Dr. Amalia Hailey  Pain Management: Dr. [name]   I have personally reviewed and have noted:  1. The patient's medical and social history 2. Their use of alcohol, tobacco or illicit drugs 3. Their current medications and supplements 4. The patient's functional ability including ADL's, fall risks, home safety risks and hearing or visual impairment. 5. Diet and physical activities 6. Evidence for depression or mood disorder  Subjective:   Review of Systems:   Constitutional: Denies fever, malaise, fatigue, headache or abrupt weight changes.  HEENT: Pt reports difficulty hearing. Denies eye pain, eye redness, ear pain, ringing in the ears, wax buildup, runny nose, nasal congestion, bloody nose, or sore throat. Respiratory: Pt reports cough and shortness of breath. Denies difficulty breathing, or sputum production.   Cardiovascular: Denies chest pain, chest tightness, palpitations or swelling in the hands or feet.  Gastrointestinal: Pt reports intermittent constipation. Denies abdominal pain, bloating, diarrhea or blood in the stool.  GU: Denies urgency, frequency, pain with urination, burning sensation, blood in urine, odor or discharge. Musculoskeletal: Pt reports chronic back pain, right hip pain, frequent falls. Denies  decrease in range of motion, difficulty with gait, or joint swelling.  Skin: Denies redness, rashes, lesions or ulcercations.  Neurological: Pt reports numbness and tingling in feet, difficulty with memory. Denies dizziness, difficulty with speech or problems with balance and coordination.  Psych: Pt reports depression. Denies anxiety, SI/HI.  No other specific complaints in a complete review of systems (except as listed in HPI above).  Objective:  PE:   BP 132/74   Pulse 82   Temp 97.9 F (36.6 C) (Oral)   Ht 6' (1.829 m)   Wt 184 lb (83.5 kg)   SpO2 94%   BMI 24.95 kg/m  Wt Readings from Last 3 Encounters:  05/14/18 184 lb (83.5 kg)  01/29/18 181 lb (82.1 kg)  10/04/17 185 lb (83.9 kg)    General: Appears his stated age, chronically ill appearing, in NAD. Skin: Warm, dry and intact. No ulcerations noted. HEENT: Head: normal shape and size; Eyes: sclera white, no icterus, conjunctiva pink, PERRLA and EOMs intact; Ears: Tm's gray and intact, normal light reflex; Throat/Mouth: Teeth present, mucosa pink and moist, no exudate, lesions or ulcerations noted.  Neck: Neck supple, trachea midline. No masses, lumps or thyromegaly present.  Cardiovascular: Normal rate and rhythm. S1,S2 noted.  No murmur, rubs or gallops noted. No JVD or BLE edema. No carotid bruits noted. Pulmonary/Chest: Normal effort and positive vesicular breath sounds. No respiratory distress. No wheezes, rales or ronchi noted.  Abdomen: Soft and nontender. Normal bowel sounds. No distention or masses noted. Liver, spleen and kidneys non palpable. Musculoskeletal: Strength 5/5 BUE/BLE. No signs of joint swelling.  Neurological: Alert and oriented. Cranial nerves II-XII grossly intact. Coordination normal.  Psychiatric: Mood and affect normal. Behavior is normal. Judgment and thought content normal.     BMET    Component Value Date/Time   NA 141 01/29/2018 0912   NA 141 10/11/2014 1122   K 4.4 01/29/2018 0912    CL 102 01/29/2018 0912   CO2 33 (H) 01/29/2018 0912   GLUCOSE  114 (H) 01/29/2018 0912   BUN 16 01/29/2018 0912   BUN 11 10/11/2014 1122   CREATININE 0.98 01/29/2018 0912   CREATININE 0.72 03/26/2013 1608   CALCIUM 9.6 01/29/2018 0912   GFRNONAA >60 10/04/2017 2102   GFRAA >60 10/04/2017 2102    Lipid Panel     Component Value Date/Time   CHOL 169 03/20/2017 1016   TRIG 183.0 (H) 03/20/2017 1016   HDL 34.90 (L) 03/20/2017 1016   CHOLHDL 5 03/20/2017 1016   VLDL 36.6 03/20/2017 1016   LDLCALC 97 03/20/2017 1016    CBC    Component Value Date/Time   WBC 6.9 01/29/2018 0912   RBC 3.90 (L) 01/29/2018 0912   HGB 12.9 (L) 01/29/2018 0912   HCT 37.8 (L) 01/29/2018 0912   PLT 182.0 01/29/2018 0912   MCV 96.9 01/29/2018 0912   MCH 32.9 10/04/2017 2102   MCHC 34.2 01/29/2018 0912   RDW 13.1 01/29/2018 0912   RDW 13.5 10/11/2014 1122   LYMPHSABS 0.8 12/17/2016 0455   LYMPHSABS 1.3 10/11/2014 1122   MONOABS 0.3 12/17/2016 0455   EOSABS 0.0 12/17/2016 0455   EOSABS 0.0 10/11/2014 1122   BASOSABS 0.0 12/17/2016 0455   BASOSABS 0.0 10/11/2014 1122    Hgb A1C Lab Results  Component Value Date   HGBA1C 6.2 03/20/2017      Assessment and Plan:   Medicare Annual Wellness Visit:  Diet: He does not eat meat. He consumes fruits more than veggies. He does not eat fried foods. He drinks mostly water, some soda. Physical activity: Sedentary Depression/mood screen: Chronic, on meds. Hearing: Intact to whispered voice Visual acuity: Grossly normal, performs annual eye exam  ADLs: Capable Fall risk: High Home safety: Good Cognitive evaluation: Known dementia, on treatment EOL planning: Adv directives, DNR/ I agree  Preventative Medicine: Flu, tetanus, pneumovax, prevnar and zostovax UTD. He declines shingrix at this time. He declines PSA and bone density at this time. Colon screening UTD. Encouraged him to consume a balanced diet and exercise regimen. Advised him to see an eye  doctor and dentist annually. Recent labs reviewed, will not get labs today.  Right Hip Pain:  Will obtain xray today Likely pain r/t placement of nerve pain stimulator Will need to follow up with Dr. [name]  Next appointment: 6 months, follow up chronic conditions.   Webb Silversmith, NP a

## 2018-05-14 NOTE — Patient Instructions (Signed)

## 2018-05-16 DIAGNOSIS — F339 Major depressive disorder, recurrent, unspecified: Secondary | ICD-10-CM | POA: Insufficient documentation

## 2018-05-16 NOTE — Assessment & Plan Note (Signed)
Currently stable cognitive and functional needs Continue Aricept, Namenda and Depakote Will monitor for progression

## 2018-05-16 NOTE — Assessment & Plan Note (Signed)
He declines being stuck for just an A1C Microalbumin needed but he declines Continue Metformin Monitor for hypoglycemia Consume a low carb diet Foot exam today Encouraged yearly eye exams Flu, pneumovax and prevnar UTD

## 2018-05-16 NOTE — Assessment & Plan Note (Signed)
Will try to send Breo to mail order to see if more affordable Continue Albuterol prn Encouraged smoking cessation but he declines at this time

## 2018-05-16 NOTE — Assessment & Plan Note (Signed)
Controlled on HCTZ Reinforced DASH diet

## 2018-05-16 NOTE — Assessment & Plan Note (Signed)
He declines lipid panel today Continue Simvastatin for now, unless he deteriorates Encouraged him to consume a low fat diet

## 2018-05-16 NOTE — Assessment & Plan Note (Signed)
Stable on Remeron Will monitor

## 2018-05-16 NOTE — Assessment & Plan Note (Signed)
Controlled with nerve pain stimulator, Meloxciam, Zanaflex, Gabapentin and Vicodin He will continue to follow with pain management

## 2018-05-16 NOTE — Assessment & Plan Note (Signed)
Continue Meloxicam, Zanaflex, Vicodin and Gabapentin Having pain from nerve pain stimulator, he will follow up with Dr. [name] about this

## 2018-05-16 NOTE — Assessment & Plan Note (Signed)
Stable on Celexa and Ativan Support offered today

## 2018-05-20 DIAGNOSIS — M533 Sacrococcygeal disorders, not elsewhere classified: Secondary | ICD-10-CM | POA: Diagnosis not present

## 2018-05-27 ENCOUNTER — Other Ambulatory Visit: Payer: Self-pay | Admitting: Anesthesiology

## 2018-05-27 ENCOUNTER — Telehealth: Payer: Self-pay | Admitting: Nurse Practitioner

## 2018-05-27 ENCOUNTER — Other Ambulatory Visit: Payer: Self-pay | Admitting: Internal Medicine

## 2018-05-27 DIAGNOSIS — R609 Edema, unspecified: Secondary | ICD-10-CM

## 2018-05-27 DIAGNOSIS — M533 Sacrococcygeal disorders, not elsewhere classified: Secondary | ICD-10-CM

## 2018-05-27 NOTE — Telephone Encounter (Signed)
Phone call to patient (spoke with daughter) to discuss medication list and allergies for myelogram procedure. Pt instructed to hold Celexa for 48 hrs prior to appointment time. Pt's daughter verbalized understanding.

## 2018-06-04 ENCOUNTER — Ambulatory Visit
Admission: RE | Admit: 2018-06-04 | Discharge: 2018-06-04 | Disposition: A | Discharge status: Home / Self Care | Payer: Medicare HMO | Source: Ambulatory Visit | Attending: Anesthesiology | Admitting: Anesthesiology

## 2018-06-04 DIAGNOSIS — M533 Sacrococcygeal disorders, not elsewhere classified: Secondary | ICD-10-CM

## 2018-06-04 DIAGNOSIS — M48061 Spinal stenosis, lumbar region without neurogenic claudication: Secondary | ICD-10-CM | POA: Diagnosis not present

## 2018-06-04 DIAGNOSIS — Z0389 Encounter for observation for other suspected diseases and conditions ruled out: Secondary | ICD-10-CM | POA: Diagnosis not present

## 2018-06-04 MED ORDER — DIAZEPAM 5 MG PO TABS
5.0000 mg | ORAL_TABLET | Freq: Once | ORAL | Status: AC
  Administered 2018-06-04: 5 mg via ORAL

## 2018-06-04 MED ORDER — IOPAMIDOL (ISOVUE-M 200) INJECTION 41%
20.0000 mL | Freq: Once | INTRAMUSCULAR | Status: AC
  Administered 2018-06-04: 20 mL via INTRATHECAL

## 2018-06-04 NOTE — Discharge Instructions (Signed)

## 2018-06-08 ENCOUNTER — Other Ambulatory Visit: Payer: Self-pay | Admitting: Internal Medicine

## 2018-06-24 DIAGNOSIS — M5416 Radiculopathy, lumbar region: Secondary | ICD-10-CM | POA: Diagnosis not present

## 2018-06-24 DIAGNOSIS — I10 Essential (primary) hypertension: Secondary | ICD-10-CM | POA: Diagnosis not present

## 2018-06-24 DIAGNOSIS — M25551 Pain in right hip: Secondary | ICD-10-CM | POA: Diagnosis not present

## 2018-06-24 DIAGNOSIS — M533 Sacrococcygeal disorders, not elsewhere classified: Secondary | ICD-10-CM | POA: Diagnosis not present

## 2018-06-29 DIAGNOSIS — M4807 Spinal stenosis, lumbosacral region: Secondary | ICD-10-CM | POA: Diagnosis not present

## 2018-07-13 DIAGNOSIS — L821 Other seborrheic keratosis: Secondary | ICD-10-CM | POA: Diagnosis not present

## 2018-07-13 DIAGNOSIS — L570 Actinic keratosis: Secondary | ICD-10-CM | POA: Diagnosis not present

## 2018-07-13 DIAGNOSIS — C44329 Squamous cell carcinoma of skin of other parts of face: Secondary | ICD-10-CM | POA: Diagnosis not present

## 2018-07-13 DIAGNOSIS — D485 Neoplasm of uncertain behavior of skin: Secondary | ICD-10-CM | POA: Diagnosis not present

## 2018-07-13 DIAGNOSIS — L814 Other melanin hyperpigmentation: Secondary | ICD-10-CM | POA: Diagnosis not present

## 2018-07-13 DIAGNOSIS — D229 Melanocytic nevi, unspecified: Secondary | ICD-10-CM | POA: Diagnosis not present

## 2018-07-13 DIAGNOSIS — L718 Other rosacea: Secondary | ICD-10-CM | POA: Diagnosis not present

## 2018-07-13 DIAGNOSIS — D1801 Hemangioma of skin and subcutaneous tissue: Secondary | ICD-10-CM | POA: Diagnosis not present

## 2018-07-13 DIAGNOSIS — Z85828 Personal history of other malignant neoplasm of skin: Secondary | ICD-10-CM | POA: Diagnosis not present

## 2018-07-20 ENCOUNTER — Encounter: Payer: Self-pay | Admitting: Internal Medicine

## 2018-07-20 ENCOUNTER — Ambulatory Visit (INDEPENDENT_AMBULATORY_CARE_PROVIDER_SITE_OTHER): Payer: Medicare HMO | Admitting: Internal Medicine

## 2018-07-20 VITALS — BP 108/66 | HR 76 | Temp 97.9°F | Wt 180.0 lb

## 2018-07-20 DIAGNOSIS — J209 Acute bronchitis, unspecified: Secondary | ICD-10-CM

## 2018-07-20 DIAGNOSIS — J440 Chronic obstructive pulmonary disease with acute lower respiratory infection: Secondary | ICD-10-CM

## 2018-07-20 MED ORDER — AMOXICILLIN-POT CLAVULANATE 875-125 MG PO TABS: 1.0000 | ORAL_TABLET | Freq: Two times a day (BID) | ORAL | 0 refills | Status: DC

## 2018-07-20 NOTE — Progress Notes (Signed)
HPI  Pt presents to the clinic today with c/o headache, generalized weakness and cough. He reports this started 5 days ago. The headache is located in his forehead. He describes the pain as pressure. He does have some runny nose but denies nasal congestion, ear pain or sore throat. The cough is nonproductive. He is slightly short of breath. He reports he has run low grade fevers. He denies chills or body aches. He has tried Tylenol and has been using his inhalers with some relief. He has a history of COPD and DM 2. He is UTD on vaccines. He has had sick contacts.  Review of Systems      Past Medical History:  Diagnosis Date  . COPD (chronic obstructive pulmonary disease) ([place])   . Depression   . Erectile dysfunction    He's had a hx of   . Hypercholesterolemia   . Hypertension    Essential  . Lumbosacral disc disease    with chronic low back pain, has implanted nerve stimulator  . Peripheral neuropathy   . Presenile dementia   . Type 2 diabetes mellitus ([place]) 2010   takes oral meds    Family History  Problem Relation Age of Onset  . Multiple myeloma Mother   . Diabetes Mother   . Cancer Sister        lymphoma  . Diabetes Sister   . Stroke Neg Hx     Social History   Socioeconomic History  . Marital status: Married    Spouse name: Vaughan Basta  . Number of children: 3  . Years of education: 56  . Highest education level: Not on file  Occupational History  . Occupation: retired    Comment: retired  Scientific laboratory technician  . Financial resource strain: Not on file  . Food insecurity:    Worry: Not on file    Inability: Not on file  . Transportation needs:    Medical: Not on file    Non-medical: Not on file  Tobacco Use  . Smoking status: Current Every Day Smoker    Packs/day: 1.00    Years: 40.00    Pack years: 40.00  . Smokeless tobacco: Never Used  Substance and Sexual Activity  . Alcohol use: No    Alcohol/week: 0.0 standard drinks  . Drug use: No  . Sexual activity:  Not on file  Lifestyle  . Physical activity:    Days per week: Not on file    Minutes per session: Not on file  . Stress: Not on file  Relationships  . Social connections:    Talks on phone: Not on file    Gets together: Not on file    Attends religious service: Not on file    Active member of club or organization: Not on file    Attends meetings of clubs or organizations: Not on file    Relationship status: Not on file  . Intimate partner violence:    Fear of current or ex partner: Not on file    Emotionally abused: Not on file    Physically abused: Not on file    Forced sexual activity: Not on file  Other Topics Concern  . Not on file  Social History Narrative   Patient is right handed   Patient resides with wife,consumes 2-3 cups of caffeine daily    Allergies  Allergen Reactions  . Codeine Hives and Itching     Constitutional: Positive headache, fatigue and fever. Denies abrupt weight changes.  HEENT:  Positive runny nose. Denies eye redness, eye pain, pressure behind the eyes, facial pain, nasal congestion, ear pain, ringing in the ears, wax buildup, or sore throat. Respiratory: Positive cough, shortness of breath. Denies difficulty breathing Cardiovascular: Denies chest pain, chest tightness, palpitations or swelling in the hands or feet.   No other specific complaints in a complete review of systems (except as listed in HPI above).  Objective:  BP 108/66   Pulse 76   Temp 97.9 F (36.6 C) (Oral)   Wt 180 lb (81.6 kg)   SpO2 92%   BMI 24.41 kg/m    Wt Readings from Last 3 Encounters:  07/20/18 180 lb (81.6 kg)  05/14/18 184 lb (83.5 kg)  01/29/18 181 lb (82.1 kg)     General: Appears his stated age, chronically ill appearing, in NAD. HEENT: Head: normal shape and size, no sinus tenderness noted;  Nose: mucosa pink and moist, septum midline; Throat/Mouth: + PND. Teeth present, mucosa erythematous and moist, no exudate noted, no lesions or ulcerations  noted.  Neck: No cervical lymphadenopathy.  Cardiovascular: Normal rate and rhythm. S1,S2 noted.  No murmur, rubs or gallops noted.  Pulmonary/Chest: Normal effort and scattered rhonchi throughout. No respiratory distress. No wheezes, rales noted.       Assessment & Plan:   Acute Bronchitis with COPD:  Get some rest and drink plenty of water Could be viral but given age, chronic health conditions, will treat with Augmentin BID x 7 days No indication for steroid at this time Take Claritin and Flonase OTC Delysm as needed for cough  RTC as needed or if symptoms persist.   Webb Silversmith, NP

## 2018-07-20 NOTE — Patient Instructions (Signed)
Acute Bronchitis, Adult Acute bronchitis is when air tubes (bronchi) in the lungs suddenly get swollen. The condition can make it hard to breathe. It can also cause these symptoms:  A cough.  Coughing up clear, yellow, or green mucus.  Wheezing.  Chest congestion.  Shortness of breath.  A fever.  Body aches.  Chills.  A sore throat. Follow these instructions at home:  Medicines  Take over-the-counter and prescription medicines only as told by your doctor.  If you were prescribed an antibiotic medicine, take it as told by your doctor. Do not stop taking the antibiotic even if you start to feel better. General instructions  Rest.  Drink enough fluids to keep your pee (urine) pale yellow.  Avoid smoking and secondhand smoke. If you smoke and you need help quitting, ask your doctor. Quitting will help your lungs heal faster.  Use an inhaler, cool mist vaporizer, or humidifier as told by your doctor.  Keep all follow-up visits as told by your doctor. This is important. How is this prevented? To lower your risk of getting this condition again:  Wash your hands often with soap and water. If you cannot use soap and water, use hand sanitizer.  Avoid contact with people who have cold symptoms.  Try not to touch your hands to your mouth, nose, or eyes.  Make sure to get the flu shot every year. Contact a doctor if:  Your symptoms do not get better in 2 weeks. Get help right away if:  You cough up blood.  You have chest pain.  You have very bad shortness of breath.  You become dehydrated.  You faint (pass out) or keep feeling like you are going to pass out.  You keep throwing up (vomiting).  You have a very bad headache.  Your fever or chills gets worse. This information is not intended to replace advice given to you by your health care provider. Make sure you discuss any questions you have with your health care provider. Document Released: 12/25/2007 Document  Revised: 02/19/2017 Document Reviewed: 12/27/2015 Elsevier Interactive Patient Education  2019 [employer]  

## 2018-07-26 ENCOUNTER — Other Ambulatory Visit: Payer: Self-pay | Admitting: Internal Medicine

## 2018-07-26 DIAGNOSIS — G4700 Insomnia, unspecified: Secondary | ICD-10-CM

## 2018-07-26 DIAGNOSIS — F0391 Unspecified dementia with behavioral disturbance: Secondary | ICD-10-CM

## 2018-07-26 DIAGNOSIS — F03918 Unspecified dementia, unspecified severity, with other behavioral disturbance: Secondary | ICD-10-CM

## 2018-07-27 ENCOUNTER — Encounter: Payer: Self-pay | Admitting: Internal Medicine

## 2018-08-05 ENCOUNTER — Other Ambulatory Visit: Payer: Self-pay | Admitting: Internal Medicine

## 2018-08-05 NOTE — Telephone Encounter (Signed)
Last filled 04/27/18 with 2 refills... please advise

## 2018-08-07 DIAGNOSIS — M533 Sacrococcygeal disorders, not elsewhere classified: Secondary | ICD-10-CM | POA: Diagnosis not present

## 2018-08-07 DIAGNOSIS — Z6825 Body mass index (BMI) 25.0-25.9, adult: Secondary | ICD-10-CM | POA: Diagnosis not present

## 2018-08-07 DIAGNOSIS — M4807 Spinal stenosis, lumbosacral region: Secondary | ICD-10-CM | POA: Diagnosis not present

## 2018-08-07 DIAGNOSIS — M5416 Radiculopathy, lumbar region: Secondary | ICD-10-CM | POA: Diagnosis not present

## 2018-08-12 ENCOUNTER — Other Ambulatory Visit: Payer: Self-pay | Admitting: Internal Medicine

## 2018-08-20 DIAGNOSIS — C44329 Squamous cell carcinoma of skin of other parts of face: Secondary | ICD-10-CM | POA: Diagnosis not present

## 2018-08-20 DIAGNOSIS — L905 Scar conditions and fibrosis of skin: Secondary | ICD-10-CM | POA: Diagnosis not present

## 2018-08-20 DIAGNOSIS — L570 Actinic keratosis: Secondary | ICD-10-CM | POA: Diagnosis not present

## 2018-08-30 ENCOUNTER — Other Ambulatory Visit: Payer: Self-pay | Admitting: Internal Medicine

## 2018-08-30 DIAGNOSIS — G4700 Insomnia, unspecified: Secondary | ICD-10-CM

## 2018-08-30 DIAGNOSIS — F0391 Unspecified dementia with behavioral disturbance: Secondary | ICD-10-CM

## 2018-08-30 DIAGNOSIS — F03918 Unspecified dementia, unspecified severity, with other behavioral disturbance: Secondary | ICD-10-CM

## 2018-09-22 ENCOUNTER — Ambulatory Visit (INDEPENDENT_AMBULATORY_CARE_PROVIDER_SITE_OTHER)
Admission: RE | Admit: 2018-09-22 | Discharge: 2018-09-22 | Disposition: A | Discharge status: Home / Self Care | Payer: Medicare HMO | Source: Ambulatory Visit | Attending: Internal Medicine | Admitting: Internal Medicine

## 2018-09-22 ENCOUNTER — Ambulatory Visit (INDEPENDENT_AMBULATORY_CARE_PROVIDER_SITE_OTHER): Payer: Medicare HMO | Admitting: Internal Medicine

## 2018-09-22 ENCOUNTER — Other Ambulatory Visit: Payer: Self-pay | Admitting: Internal Medicine

## 2018-09-22 ENCOUNTER — Encounter: Payer: Self-pay | Admitting: Internal Medicine

## 2018-09-22 VITALS — BP 122/84 | HR 69 | Temp 97.7°F | Wt 200.0 lb

## 2018-09-22 DIAGNOSIS — J439 Emphysema, unspecified: Secondary | ICD-10-CM

## 2018-09-22 DIAGNOSIS — G250 Essential tremor: Secondary | ICD-10-CM

## 2018-09-22 DIAGNOSIS — R0602 Shortness of breath: Secondary | ICD-10-CM

## 2018-09-22 DIAGNOSIS — R059 Cough, unspecified: Secondary | ICD-10-CM

## 2018-09-22 DIAGNOSIS — R05 Cough: Secondary | ICD-10-CM

## 2018-09-22 MED ORDER — FLUTICASONE PROPIONATE HFA 44 MCG/ACT IN AERO: 1.0000 | INHALATION_SPRAY | Freq: Two times a day (BID) | RESPIRATORY_TRACT | 12 refills | Status: DC

## 2018-09-22 MED ORDER — LEVOFLOXACIN 250 MG PO TABS: ORAL_TABLET | ORAL | 0 refills | Status: DC

## 2018-09-22 MED ORDER — BUDESONIDE-FORMOTEROL FUMARATE 80-4.5 MCG/ACT IN AERO: 2.0000 | INHALATION_SPRAY | Freq: Two times a day (BID) | RESPIRATORY_TRACT | 3 refills | Status: DC

## 2018-09-22 NOTE — Patient Instructions (Signed)

## 2018-09-22 NOTE — Progress Notes (Signed)
HPI  Pt presents to the clinic today with c/o cough and shortness of breath. He reports this has been going on for 2 months. The cough is productive but he can not describe the color of the sputum. He is short of breath at rest and with exertion. He denies chest pain with exertion. He denies runny nose, nasal congestion, ear pain or sore throat.  He has a history of COPD and does continue to smoke.  He is using Breo and Albuterol with some relief. There are no PFT's on file. He does not follow with pulmonology.  He also reports worsening tremor, mainly in his right hand. He reports the tremor is intermittent. He does feel like it's worse when he is anxious. He does not notice it during his sleep. This tremor has not caused him to drop things or having trouble feeding himself. He is right hand dominant.  Review of Systems      Past Medical History:  Diagnosis Date  . COPD (chronic obstructive pulmonary disease) ([place])   . Depression   . Erectile dysfunction    He's had a hx of   . Hypercholesterolemia   . Hypertension    Essential  . Lumbosacral disc disease    with chronic low back pain, has implanted nerve stimulator  . Peripheral neuropathy   . Presenile dementia   . Type 2 diabetes mellitus ([place]) 2010   takes oral meds    Family History  Problem Relation Age of Onset  . Multiple myeloma Mother   . Diabetes Mother   . Cancer Sister        lymphoma  . Diabetes Sister   . Stroke Neg Hx     Social History   Socioeconomic History  . Marital status: Married    Spouse name: Vaughan Basta  . Number of children: 3  . Years of education: 37  . Highest education level: Not on file  Occupational History  . Occupation: retired    Comment: retired  Scientific laboratory technician  . Financial resource strain: Not on file  . Food insecurity:    Worry: Not on file    Inability: Not on file  . Transportation needs:    Medical: Not on file    Non-medical: Not on file  Tobacco Use  . Smoking status:  Current Every Day Smoker    Packs/day: 1.00    Years: 40.00    Pack years: 40.00  . Smokeless tobacco: Never Used  Substance and Sexual Activity  . Alcohol use: No    Alcohol/week: 0.0 standard drinks  . Drug use: No  . Sexual activity: Not on file  Lifestyle  . Physical activity:    Days per week: Not on file    Minutes per session: Not on file  . Stress: Not on file  Relationships  . Social connections:    Talks on phone: Not on file    Gets together: Not on file    Attends religious service: Not on file    Active member of club or organization: Not on file    Attends meetings of clubs or organizations: Not on file    Relationship status: Not on file  . Intimate partner violence:    Fear of current or ex partner: Not on file    Emotionally abused: Not on file    Physically abused: Not on file    Forced sexual activity: Not on file  Other Topics Concern  . Not on file  Social  History Narrative   Patient is right handed   Patient resides with wife,consumes 2-3 cups of caffeine daily    Allergies  Allergen Reactions  . Codeine Hives and Itching     Constitutional: Positive fatigue. Denies headache, fever or abrupt weight changes.  HEENT:   Denies eye redness, eye pain, pressure behind the eyes, facial pain, nasal congestion, ear pain, ringing in the ears, wax buildup, runny nose or sore throat. Respiratory: Positive cough and shortness of breath. Denies difficulty breathingh.  Cardiovascular: Denies chest pain, chest tightness, palpitations or swelling in the hands or feet.  Neuro: Positive tremor. Denies weakness.  No other specific complaints in a complete review of systems (except as listed in HPI above).  Objective:   BP 122/84   Pulse 69   Temp 97.7 F (36.5 C) (Oral)   Wt 200 lb (90.7 kg)   SpO2 93%   BMI 27.12 kg/m   Wt Readings from Last 3 Encounters:  07/20/18 180 lb (81.6 kg)  05/14/18 184 lb (83.5 kg)  01/29/18 181 lb (82.1 kg)     General:  Appears his stated age, well developed, well nourished in NAD. HEENT: Head: normal shape and size, no sinus tenderness noted; Ears: Tm's gray and intact, normal light reflex; Nose: mucosa pink and moist, septum midline; Throat/Mouth: Teeth present, mucosa erythematous and moist, no exudate noted, no lesions or ulcerations noted.  Neck: No cervical lymphadenopathy.  Cardiovascular: Normal rate and rhythm.  Pulmonary/Chest: Normal effort and diminished breath sounds. No respiratory distress. No wheezes, rales or ronchi noted.       Assessment & Plan:   COPD, SOB:  Likely not well controlled Stop Breo Will start Symbicort 1 puff BID Continue Albuterol prn Chest xray today Discussed smoking cessation, he has cut back but is not agreeable to stopping at this time  Essential Tremor:  Has seen neurology in the past, no mention of parkinson's He is on multiple meds that cause tremors, but he needs all medications prescribed He agrees to monitor for now, if worsens, he will let me know  Return precautions discussed Webb Silversmith, NP   RTC as needed or if symptoms persist.   Webb Silversmith, NP

## 2018-09-30 ENCOUNTER — Encounter: Payer: Self-pay | Admitting: Podiatry

## 2018-09-30 ENCOUNTER — Other Ambulatory Visit: Payer: Self-pay | Admitting: Podiatry

## 2018-09-30 ENCOUNTER — Other Ambulatory Visit: Payer: Self-pay

## 2018-09-30 ENCOUNTER — Ambulatory Visit: Payer: Medicare HMO | Admitting: Podiatry

## 2018-09-30 ENCOUNTER — Ambulatory Visit (INDEPENDENT_AMBULATORY_CARE_PROVIDER_SITE_OTHER): Payer: Medicare HMO

## 2018-09-30 DIAGNOSIS — E0842 Diabetes mellitus due to underlying condition with diabetic polyneuropathy: Secondary | ICD-10-CM | POA: Diagnosis not present

## 2018-09-30 DIAGNOSIS — L989 Disorder of the skin and subcutaneous tissue, unspecified: Secondary | ICD-10-CM

## 2018-09-30 DIAGNOSIS — M7752 Other enthesopathy of left foot: Secondary | ICD-10-CM

## 2018-09-30 DIAGNOSIS — Q828 Other specified congenital malformations of skin: Secondary | ICD-10-CM | POA: Diagnosis not present

## 2018-09-30 DIAGNOSIS — M79672 Pain in left foot: Secondary | ICD-10-CM

## 2018-10-04 NOTE — Progress Notes (Signed)
   Subjective: 73 year old male presenting today with a chief complaint of pain to the lateral left foot that began 3-4 months ago. He notes an associated painful callus lesion below the 5th digit. Walking and bearing weight increases the pain. He is interested in new DM shoes. He has not had any treatment for his symptoms. Patient is here for further evaluation and treatment.   Past Medical History:  Diagnosis Date  . COPD (chronic obstructive pulmonary disease) ([place])   . Depression   . Erectile dysfunction    He's had a hx of   . Hypercholesterolemia   . Hypertension    Essential  . Lumbosacral disc disease    with chronic low back pain, has implanted nerve stimulator  . Peripheral neuropathy   . Presenile dementia   . Type 2 diabetes mellitus ([place]) 2010   takes oral meds    Objective:  Physical Exam General: Alert and oriented x3 in no acute distress  Dermatology: Hyperkeratotic lesion present on the 5th MPJ of the left foot. Pain on palpation with a central nucleated core noted.  Skin is warm, dry and supple bilateral lower extremities. Negative for open lesions or macerations.  Vascular: Palpable pedal pulses bilaterally. No edema or erythema noted. Capillary refill within normal limits.  Neurological: Epicritic and protective threshold diminished bilaterally.   Musculoskeletal Exam: Pain on palpation at the keratotic lesion noted as well as to the 5th MPJ. Range of motion within normal limits bilateral. Muscle strength 5/5 in all groups bilateral.  Radiographic Exam:  Normal osseous mineralization. Joint spaces preserved. No fracture/dislocation/boney destruction.    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 Salinocaine applied.  #3 Dressed area with light dressing. #4 Injection of 0.5 mLs Celestone Soluspan injected into the 5th MPJ of the left foot.  #5 New orders for DM shoes and insoles placed today.  #6 Patient is to return to the clinic PRN.    Edrick Kins, DPM [facility]  Dr. Edrick Kins, [address]                                        [place], [place] [address]                Office [phone]  Fax [phone]

## 2018-10-07 ENCOUNTER — Encounter: Payer: Self-pay | Admitting: Internal Medicine

## 2018-10-08 MED ORDER — LEVOFLOXACIN 250 MG PO TABS: ORAL_TABLET | ORAL | 0 refills | Status: DC

## 2018-10-10 ENCOUNTER — Other Ambulatory Visit: Payer: Self-pay | Admitting: Internal Medicine

## 2018-10-12 NOTE — Telephone Encounter (Signed)
Mirtazapine last filled 08/13/2018... also is pt supposed to take 300mg  and 400mg  Gabapentin?

## 2018-10-13 NOTE — Telephone Encounter (Signed)
He takes Gabapentin 300 mg BID and 400 mg at bedtime.

## 2018-10-17 ENCOUNTER — Other Ambulatory Visit: Payer: Self-pay | Admitting: Internal Medicine

## 2018-10-17 DIAGNOSIS — F329 Major depressive disorder, single episode, unspecified: Secondary | ICD-10-CM

## 2018-10-17 DIAGNOSIS — F0391 Unspecified dementia with behavioral disturbance: Secondary | ICD-10-CM

## 2018-10-17 DIAGNOSIS — F32A Depression, unspecified: Secondary | ICD-10-CM

## 2018-10-17 DIAGNOSIS — F03918 Unspecified dementia, unspecified severity, with other behavioral disturbance: Secondary | ICD-10-CM

## 2018-10-17 DIAGNOSIS — G4700 Insomnia, unspecified: Secondary | ICD-10-CM

## 2018-10-21 ENCOUNTER — Telehealth: Payer: Self-pay

## 2018-10-21 NOTE — Telephone Encounter (Signed)
Alexander Booth (DPR signed) starting on 10/16/18 both feet and lower legs mid calf began to swell. Swelling does not go down overnight and swelling is worse at end of day but pt keeps feet up when sitting. Pt has laces loosened on shoes.pt taking HCTZ 25 mg two tabs daily. No unusual SOB; pt has COPD and emphysema. No CP and no redness on legs or feet. No weeping. Alexander Booth did not want to schedule webex with anyone but Avie Echevaria NP. Is it OK to wait until 10/22/18 for webex visit. Alexander Booth request cb.

## 2018-10-21 NOTE — Telephone Encounter (Signed)
Yes. Ok to wait until tomorrow.

## 2018-10-22 ENCOUNTER — Encounter: Payer: Self-pay | Admitting: Internal Medicine

## 2018-10-22 ENCOUNTER — Ambulatory Visit (INDEPENDENT_AMBULATORY_CARE_PROVIDER_SITE_OTHER): Payer: Medicare HMO | Admitting: Internal Medicine

## 2018-10-22 DIAGNOSIS — R609 Edema, unspecified: Secondary | ICD-10-CM | POA: Diagnosis not present

## 2018-10-22 MED ORDER — FUROSEMIDE 20 MG PO TABS: 20.0000 mg | ORAL_TABLET | Freq: Every day | ORAL | 2 refills | Status: DC

## 2018-10-22 NOTE — Patient Instructions (Signed)

## 2018-10-22 NOTE — Progress Notes (Signed)
Virtual Visit via Video Note  I connected with Alexander Booth on 10/22/18 at 12:15 PM EDT by a video enabled telemedicine application and verified that I am speaking with the correct person using two identifiers.   I discussed the limitations of evaluation and management by telemedicine and the availability of in person appointments. The patient expressed understanding and agreed to proceed.  History of Present Illness:  Pt concerned about swelling in legs. This has been intermittent. It is not painful. There is no redness or warmth. It does not interfere with gait. He denies worsening cough or SOB. He has been taking HCTZ as prescribed and elevating feet with some relief. He does add salt to diet.   Observations/Objective:  Alert and oriented x 2 NAD Appears to have 1+ BLE edema, some vascular changes but no concern for cellulitis  Assessment and Plan:  Peripheral Edema:  Kidney function reviewed-normal Stop HCTZ Start Furosemide 20 mg daily Advised them it is okay if he needs extra daily dose for edema here and there Discussed reducing salt intake  Will reassess as needed  Follow Up Instructions:    I discussed the assessment and treatment plan with the patient. The patient was provided an opportunity to ask questions and all were answered. The patient agreed with the plan and demonstrated an understanding of the instructions.   The patient was advised to call back or seek an in-person evaluation if the symptoms worsen or if the condition fails to improve as anticipated.     Webb Silversmith, NP

## 2018-11-07 ENCOUNTER — Other Ambulatory Visit: Payer: Self-pay | Admitting: Internal Medicine

## 2018-11-08 NOTE — Telephone Encounter (Signed)
Last filled 08/05/18 with 2 refills... please advise

## 2018-11-11 ENCOUNTER — Encounter: Payer: Self-pay | Admitting: Internal Medicine

## 2018-11-11 ENCOUNTER — Telehealth: Payer: Self-pay | Admitting: Radiology

## 2018-11-11 ENCOUNTER — Telehealth: Payer: Self-pay | Admitting: Internal Medicine

## 2018-11-11 ENCOUNTER — Ambulatory Visit (INDEPENDENT_AMBULATORY_CARE_PROVIDER_SITE_OTHER): Payer: Medicare HMO | Admitting: Internal Medicine

## 2018-11-11 ENCOUNTER — Other Ambulatory Visit: Payer: Self-pay

## 2018-11-11 ENCOUNTER — Other Ambulatory Visit (INDEPENDENT_AMBULATORY_CARE_PROVIDER_SITE_OTHER): Payer: Medicare HMO

## 2018-11-11 ENCOUNTER — Ambulatory Visit (INDEPENDENT_AMBULATORY_CARE_PROVIDER_SITE_OTHER)
Admission: RE | Admit: 2018-11-11 | Discharge: 2018-11-11 | Disposition: A | Discharge status: Home / Self Care | Payer: Medicare HMO | Source: Ambulatory Visit | Attending: Internal Medicine | Admitting: Internal Medicine

## 2018-11-11 DIAGNOSIS — J449 Chronic obstructive pulmonary disease, unspecified: Secondary | ICD-10-CM

## 2018-11-11 DIAGNOSIS — R0602 Shortness of breath: Secondary | ICD-10-CM | POA: Diagnosis not present

## 2018-11-11 DIAGNOSIS — R39198 Other difficulties with micturition: Secondary | ICD-10-CM

## 2018-11-11 DIAGNOSIS — R609 Edema, unspecified: Secondary | ICD-10-CM

## 2018-11-11 MED ORDER — TAMSULOSIN HCL 0.4 MG PO CAPS: 0.4000 mg | ORAL_CAPSULE | Freq: Every day | ORAL | 3 refills | Status: DC

## 2018-11-11 NOTE — Patient Instructions (Signed)

## 2018-11-11 NOTE — Telephone Encounter (Signed)
New Message:    Pt is scheduled for Echo tomorrow. His daughter wants to know if she can come in with him.Pt has Dementia and she needs to be with him please.

## 2018-11-11 NOTE — Progress Notes (Signed)
Virtual Visit via Video Note  I connected with Efe [facility]azzino [facility] on 11/11/18 at  3:45 PM EDT by a video enabled telemedicine application and verified that I am speaking with the correct person using two identifiers.   I discussed the limitations of evaluation and management by telemedicine and the availability of in person appointments. The patient expressed understanding and agreed to proceed.  Patient Location: Home Provider Location: Office  History of Present Illness:  Asked to recheck pt. Daughter reports worsening BLE edema, increase shortness of breath. No history of CHF. Does have COPD and does continue to smoke. Edema not painful. They denies redness or warmth. He does have chronic neuropathy, controlled on Gabapentin. HCTZ was changed to Lasix. She has given him 40 mg with no improvement. He denies runny nose, nasal congestion, ear pain or sore throat. They deny fever, chills or body aches. He denies any recent changes in diet. He does not feel like he is passing urine as well as before. He has not had any prior prostate problems. They have tried elevating his legs at home with minimal relief.   Observations/Objective:  Alert and oriented x 3 NAD 2+ edema of BLE No obvious redness No obvious cough, wheezing or SOB  Assessment and Plan:  Shortness of Breath, Peripheral Edema COPD, Difficulty Urinating:  Will have him schedule lab only appt for CBC, CMET, TSH, BNP Will have him schedule lab only appt for repeat chest xray. Xray from 09/2018 reviewed. Will obtain 2D echo Discussed DASH diet, encouraged smoking cessation, advised to continue elevating legs Will start Flomax 0.4 mg daily, RX sent to pharmacy  Will follow up after labs and imaging are back, ER precautions discussed  Follow Up Instructions:    I discussed the assessment and treatment plan with the patient. The patient was provided an opportunity to ask questions and all were answered. The patient agreed with the  plan and demonstrated an understanding of the instructions.   The patient was advised to call back or seek an in-person evaluation if the symptoms worsen or if the condition fails to improve as anticipated.    Webb Silversmith, NP

## 2018-11-12 ENCOUNTER — Ambulatory Visit (HOSPITAL_COMMUNITY): Payer: Medicare HMO | Attending: Cardiovascular Disease

## 2018-11-12 DIAGNOSIS — R609 Edema, unspecified: Secondary | ICD-10-CM | POA: Diagnosis not present

## 2018-11-12 DIAGNOSIS — J449 Chronic obstructive pulmonary disease, unspecified: Secondary | ICD-10-CM

## 2018-11-12 DIAGNOSIS — R0602 Shortness of breath: Secondary | ICD-10-CM | POA: Insufficient documentation

## 2018-11-12 LAB — COMPREHENSIVE METABOLIC PANEL
ALT: 17 U/L (ref 0–53)
AST: 16 U/L (ref 0–37)
Albumin: 4.3 g/dL (ref 3.5–5.2)
Alkaline Phosphatase: 55 U/L (ref 39–117)
BUN: 14 mg/dL (ref 6–23)
CO2: 32 mEq/L (ref 19–32)
Calcium: 10 mg/dL (ref 8.4–10.5)
Chloride: 97 mEq/L (ref 96–112)
Creatinine, Ser: 1.17 mg/dL (ref 0.40–1.50)
GFR: 60.76 mL/min (ref >60.00)
Glucose, Bld: 149 mg/dL — ABNORMAL HIGH (ref 70–99)
Potassium: 5.3 mEq/L — ABNORMAL HIGH (ref 3.5–5.1)
Sodium: 137 mEq/L (ref 135–145)
Total Bilirubin: 0.4 mg/dL (ref 0.2–1.2)
Total Protein: 7.3 g/dL (ref 6.0–8.3)

## 2018-11-12 LAB — TSH: TSH: 2.99 u[IU]/mL (ref 0.35–4.50)

## 2018-11-12 LAB — CBC
HCT: 41.9 % (ref 39.0–52.0)
Hemoglobin: 14.6 g/dL (ref 13.0–17.0)
MCHC: 34.8 g/dL (ref 30.0–36.0)
MCV: 96.2 fl (ref 78.0–100.0)
Platelets: 192 10*3/uL (ref 150.0–400.0)
RBC: 4.35 Mil/uL (ref 4.22–5.81)
RDW: 13.1 % (ref 11.5–15.5)
WBC: 6.6 10*3/uL (ref 4.0–10.5)

## 2018-11-12 LAB — BRAIN NATRIURETIC PEPTIDE: Pro B Natriuretic peptide (BNP): 34 pg/mL (ref 0.0–100.0)

## 2018-11-17 ENCOUNTER — Other Ambulatory Visit: Payer: Self-pay | Admitting: Internal Medicine

## 2018-11-17 DIAGNOSIS — R609 Edema, unspecified: Secondary | ICD-10-CM

## 2018-11-18 ENCOUNTER — Ambulatory Visit (HOSPITAL_COMMUNITY)
Admission: RE | Admit: 2018-11-18 | Discharge: 2018-11-18 | Disposition: A | Discharge status: Home / Self Care | Payer: Medicare HMO | Source: Ambulatory Visit | Attending: Internal Medicine | Admitting: Internal Medicine

## 2018-11-18 ENCOUNTER — Encounter (HOSPITAL_COMMUNITY): Payer: Self-pay

## 2018-11-18 DIAGNOSIS — R609 Edema, unspecified: Secondary | ICD-10-CM | POA: Insufficient documentation

## 2018-11-18 NOTE — Progress Notes (Signed)
Bilateral lower venous has been completed and is negative for DVT. Preliminary results can be found under CV proc through chart review.   Alecia Mackin RVT [facility] ____________________________

## 2018-11-26 ENCOUNTER — Other Ambulatory Visit: Payer: Self-pay | Admitting: Internal Medicine

## 2018-12-07 ENCOUNTER — Other Ambulatory Visit: Payer: Self-pay | Admitting: Internal Medicine

## 2018-12-08 ENCOUNTER — Other Ambulatory Visit: Payer: Self-pay | Admitting: Internal Medicine

## 2018-12-08 MED ORDER — FUROSEMIDE 20 MG PO TABS: 20.0000 mg | ORAL_TABLET | Freq: Two times a day (BID) | ORAL | 1 refills | Status: DC

## 2018-12-08 NOTE — Telephone Encounter (Signed)
Best number [phone] [name] (daughther) called in to get a refill on furosemide She stated [facility] will be sending refill request  She stated regina upped this to 2 a day She stated regina needed to change directions

## 2018-12-08 NOTE — Telephone Encounter (Signed)
[name], pt's daughter reports pt is doing a lot better with the Lasix BID and would like the sig changed to reflect the new instructions.... Sig for Lasix has been changed to support the BID... Lorazepam last filled 11/08/2018... please advise

## 2018-12-08 NOTE — Telephone Encounter (Signed)
Remeron last filled 10/13/2018... please advise

## 2018-12-12 ENCOUNTER — Other Ambulatory Visit: Payer: Self-pay | Admitting: Internal Medicine

## 2018-12-12 DIAGNOSIS — F329 Major depressive disorder, single episode, unspecified: Secondary | ICD-10-CM

## 2018-12-12 DIAGNOSIS — F32A Depression, unspecified: Secondary | ICD-10-CM

## 2018-12-12 DIAGNOSIS — G4700 Insomnia, unspecified: Secondary | ICD-10-CM

## 2018-12-12 DIAGNOSIS — F0391 Unspecified dementia with behavioral disturbance: Secondary | ICD-10-CM

## 2018-12-12 DIAGNOSIS — F03918 Unspecified dementia, unspecified severity, with other behavioral disturbance: Secondary | ICD-10-CM

## 2018-12-15 ENCOUNTER — Other Ambulatory Visit: Payer: Self-pay | Admitting: Internal Medicine

## 2018-12-24 ENCOUNTER — Other Ambulatory Visit: Payer: Self-pay | Admitting: Internal Medicine

## 2019-01-05 ENCOUNTER — Other Ambulatory Visit: Payer: Self-pay | Admitting: Internal Medicine

## 2019-01-07 DIAGNOSIS — M4807 Spinal stenosis, lumbosacral region: Secondary | ICD-10-CM | POA: Diagnosis not present

## 2019-01-10 ENCOUNTER — Other Ambulatory Visit: Payer: Self-pay | Admitting: Internal Medicine

## 2019-01-10 NOTE — Telephone Encounter (Signed)
Last filled 12/08/2018.Marland KitchenMarland KitchenMarland Kitchen please advise

## 2019-01-25 DIAGNOSIS — M5416 Radiculopathy, lumbar region: Secondary | ICD-10-CM | POA: Diagnosis not present

## 2019-02-01 ENCOUNTER — Other Ambulatory Visit: Payer: Self-pay | Admitting: Internal Medicine

## 2019-02-02 ENCOUNTER — Telehealth: Payer: Self-pay

## 2019-02-02 NOTE — Telephone Encounter (Signed)
Left message for patient or daughter [name] to call back. Received a fax from [facility] for RX for Accu check aviva plus meter, accu check aviva plus test strips, and accu check softclix lancets. Patient has not been checking his sugar per our records that I can see and since this is the first time it is been requested I want to make sure patient requested this before filling it.

## 2019-02-02 NOTE — Telephone Encounter (Signed)
Spoke with [name] and yes they do want patient to start checking his sugar. I will ask provider how often she wants patient to check and then I can send in scripts for the below.

## 2019-02-02 NOTE — Telephone Encounter (Signed)
bid

## 2019-02-03 MED ORDER — ACCU-CHEK AVIVA PLUS VI STRP: ORAL_STRIP | 3 refills | Status: AC

## 2019-02-03 MED ORDER — ACCU-CHEK AVIVA PLUS W/DEVICE KIT: PACK | 12 refills | Status: DC

## 2019-02-03 MED ORDER — ACCU-CHEK SOFTCLIX LANCETS MISC: 3 refills | Status: DC

## 2019-02-03 NOTE — Addendum Note (Signed)
Addended by: Kris Mouton on: 02/03/2019 09:48 AM   Modules accepted: Orders

## 2019-02-03 NOTE — Telephone Encounter (Signed)
RXs sent in 

## 2019-02-15 ENCOUNTER — Encounter: Payer: Self-pay | Admitting: Family Medicine

## 2019-02-15 ENCOUNTER — Other Ambulatory Visit: Payer: Medicare HMO

## 2019-02-15 ENCOUNTER — Ambulatory Visit (INDEPENDENT_AMBULATORY_CARE_PROVIDER_SITE_OTHER): Payer: Medicare HMO | Admitting: Family Medicine

## 2019-02-15 ENCOUNTER — Telehealth: Payer: Self-pay | Admitting: Internal Medicine

## 2019-02-15 VITALS — BP 138/86 | Temp 98.0°F | Wt 187.0 lb

## 2019-02-15 DIAGNOSIS — B3749 Other urogenital candidiasis: Secondary | ICD-10-CM | POA: Diagnosis not present

## 2019-02-15 DIAGNOSIS — R300 Dysuria: Secondary | ICD-10-CM | POA: Diagnosis not present

## 2019-02-15 DIAGNOSIS — R3129 Other microscopic hematuria: Secondary | ICD-10-CM | POA: Diagnosis not present

## 2019-02-15 DIAGNOSIS — R319 Hematuria, unspecified: Secondary | ICD-10-CM | POA: Insufficient documentation

## 2019-02-15 LAB — POC URINALSYSI DIPSTICK (AUTOMATED)
Bilirubin, UA: NEGATIVE
Blood, UA: 200
Glucose, UA: POSITIVE — AB
Ketones, UA: NEGATIVE
Leukocytes, UA: NEGATIVE
Nitrite, UA: NEGATIVE
Protein, UA: NEGATIVE
Spec Grav, UA: 1.015 (ref 1.010–1.025)
Urobilinogen, UA: 0.2 E.U./dL
pH, UA: 7 (ref 5.0–8.0)

## 2019-02-15 MED ORDER — KETOCONAZOLE 2 % EX CREA: 1.0000 "application " | TOPICAL_CREAM | Freq: Every day | CUTANEOUS | 0 refills | Status: DC

## 2019-02-15 MED ORDER — FLUCONAZOLE 150 MG PO TABS: ORAL_TABLET | ORAL | 0 refills | Status: DC

## 2019-02-15 NOTE — Patient Instructions (Signed)
I think you have a yeast infection in the groin area and bleeding may come from irritation and scratching Keep area clean and as dry as possible  Take the diflucan as directed Try the nizoral cream  If no improvement please let us know  If worse red ness or swelling also let us know   I will send urine for a culture but do not strongly suspect a uti   If diarrhea continues or worsens let us know Also if fever or any other new symptoms

## 2019-02-15 NOTE — Progress Notes (Signed)
Virtual Visit via Video Note  I connected with Alexander Booth on 02/15/19 at  4:15 PM EDT by a video enabled telemedicine application and verified that I am speaking with the correct person using two identifiers.  Location: Patient: home (with his daughter) Provider: office    I discussed the limitations of evaluation and management by telemedicine and the availability of in person appointments. The patient expressed understanding and agreed to proceed.  History of Present Illness: 73 yo pt of NP Baity presents with urinary symptoms and back pain   Some itching/burning and bleeding (around tip of penis)  Also some redness He has had jock itch in the past -feels similar  Going on for 2 week   No frequency or urgency  ? If bladder pain  No problems passing urine    Some diarrhea just today  Just one time  No new medicines  He ate raisin bread this am   No fever  Does not feel sick    ua : Results for orders placed or performed in visit on 02/15/19  POCT Urinalysis Dipstick (Automated)  Result Value Ref Range   Color, UA Yellow    Clarity, UA Clear    Glucose, UA Positive (A) Negative   Bilirubin, UA Negative    Ketones, UA Negative    Spec Grav, UA 1.015 1.010 - 1.025   Blood, UA 200 Ery/uL    pH, UA 7.0 5.0 - 8.0   Protein, UA Negative Negative   Urobilinogen, UA 0.2 0.2 or 1.0 E.U./dL   Nitrite, UA Negative    Leukocytes, UA Negative Negative     smoking status - current smoker  flomax is on medicine list  No h/o prostate infection   Patient Active Problem List   Diagnosis Date Noted  . Blood in urine 02/15/2019  . Yeast dermatitis of penis 02/15/2019  . Depression, recurrent ([place]) 05/16/2018  . Tobacco dependence 12/17/2016  . Dyslipidemia 01/13/2016  . Essential hypertension 01/13/2016  . Peripheral neuropathy 01/13/2016  . 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([place]) 01/10/2016  . Sleep disturbance 10/25/2014  . Emphysema of lung ([place]) 10/25/2014   . Chronic back pain 10/26/2013  . Type 2 diabetes mellitus with complication ([place]) 70/96/2836  . Osteoarthritis 06/18/2011   Past Medical History:  Diagnosis Date  . COPD (chronic obstructive pulmonary disease) ([place])   . Depression   . Erectile dysfunction    He's had a hx of   . Hypercholesterolemia   . Hypertension    Essential  . Lumbosacral disc disease    with chronic low back pain, has implanted nerve stimulator  . Peripheral neuropathy   . Presenile dementia   . Type 2 diabetes mellitus ([place]) 2010   takes oral meds   Past Surgical History:  Procedure Laterality Date  . ANTERIOR CERVICAL DECOMP/DISCECTOMY FUSION    . APPENDECTOMY    . HEMORRHOID SURGERY     x2  . JOINT REPLACEMENT Left    2nd one was to change siZe  . KNEE SURGERY     Previous left total replacement  . KYPHOPLASTY N/A 12/10/2012   Procedure: Lumbar one Kyphoplasty;  Surgeon: Kristeen Miss, MD;  Location: [facility];  Service: Neurosurgery;  Laterality: N/A;  Lumbar One Kyphoplasty  . OTHER SURGICAL HISTORY  1992   Bone Fusion in Right Foot  . SPINAL CORD STIMULATOR INSERTION N/A 12/02/2014   Procedure: THORACIC SPINAL CORD STIMULATOR INSERTION INFINION [employer]OSTON [employer] 16 LEAD WITH LAMINECTOMY;  Surgeon: Broadus John  Vertell Limber, MD;  Location: [facility];  Service: Neurosurgery;  Laterality: N/A;  THORACIC SPINAL CORD STIMULATOR INSERTION INFINION BOSTON [employer] 16 LEAD WITH LAMINECTOMY  . TOTAL KNEE ARTHROPLASTY Left    Social History   Tobacco Use  . Smoking status: Current Every Day Smoker    Packs/day: 1.00    Years: 40.00    Pack years: 40.00  . Smokeless tobacco: Never Used  Substance Use Topics  . Alcohol use: No    Alcohol/week: 0.0 standard drinks  . Drug use: No   Family History  Problem Relation Age of Onset  . Multiple myeloma Mother   . Diabetes Mother   . Cancer Sister        lymphoma  . Diabetes Sister   . Stroke Neg Hx    Allergies  Allergen Reactions  . Codeine Hives  and Itching   Current Outpatient Medications on File Prior to Visit  Medication Sig Dispense Refill  . Accu-Chek Softclix Lancets lancets Check sugar 2 times daily. DX E11.8 200 each 3  . aspirin 81 MG chewable tablet Chew 81 mg by mouth daily.     . bisacodyl (DULCOLAX) 5 MG EC tablet Take 5 mg by mouth daily as needed for mild constipation.     . Blood Glucose Monitoring Suppl (ACCU-CHEK AVIVA PLUS) w/Device KIT Check sugar 2 times daily. DX E11.8 1 kit 12  . BREO ELLIPTA 100-25 MCG/INH AEPB INHALE 1 PUFF BY MOUTH EVERY DAY 60 each 2  . citalopram (CELEXA) 10 MG tablet TAKE 1 TABLET EVERY DAY 90 tablet 0  . cyanocobalamin (,VITAMIN B-12,) 1000 MCG/ML injection Inject 1,000 mcg into the muscle every 30 (thirty) days.    . divalproex (DEPAKOTE ER) 500 MG 24 hr tablet TAKE 2 TABLETS EVERY DAY 180 tablet 1  . donepezil (ARICEPT) 10 MG tablet TAKE 1 TABLET EVERY MORNING 90 tablet 0  . furosemide (LASIX) 20 MG tablet TAKE 1 TABLET BY MOUTH TWICE A DAY 60 tablet 1  . gabapentin (NEURONTIN) 300 MG capsule TAKE 1 CAPSULE TWICE DAILY 180 capsule 3  . gabapentin (NEURONTIN) 400 MG capsule TAKE 3 CAPSULES AT BEDTIME AND MAY REPEAT DOSE ONE TIME IF NEEDED 270 capsule 1  . glucose blood (ACCU-CHEK AVIVA PLUS) test strip Check sugar 2 times daily. DX E11.8 200 each 3  . HYDROcodone-acetaminophen (NORCO) 10-325 MG tablet Take 1 tablet by mouth every 6 (six) hours.    Marland Kitchen levofloxacin (LEVAQUIN) 250 MG tablet Take 2 tabs po on day 1, then 1 tab po daily x 6 days 8 tablet 0  . LORazepam (ATIVAN) 0.5 MG tablet TAKE 1 TABLET BY MOUTH EVERY DAY AS NEEDED FOR ANXIETY 20 tablet 0  . meloxicam (MOBIC) 15 MG tablet Take 1 tablet (15 mg total) by mouth daily. 90 tablet 3  . memantine (NAMENDA XR) 7 MG CP24 24 hr capsule TAKE 1 CAPSULE (7 MG TOTAL) BY MOUTH EVERY MORNING. 90 capsule 2  . metFORMIN (GLUCOPHAGE) 500 MG tablet TAKE 1 TABLET TWICE DAILY 180 tablet 2  . mirtazapine (REMERON) 45 MG tablet TAKE 1 TABLET AT  BEDTIME 90 tablet 0  . nitroGLYCERIN (NITROSTAT) 0.4 MG SL tablet Place 0.4 mg under the tongue every 5 (five) minutes as needed for chest pain.     . NONFORMULARY OR COMPOUNDED ITEM [facility]:  Peripheral Neuropathy cream - Bupivacaine 1%, Doxepin 3%, Gabapentin 6%, Pentoxifylline 3%, Topiramate 1%, apply to affected area 1-2 grams 3-4 times daily (Patient taking differently: Apply 1  application topically daily as needed (PAIN). [facility]:  Peripheral Neuropathy cream - Bupivacaine 1%, Doxepin 3%, Gabapentin 6%, Pentoxifylline 3%, Topiramate 1%, apply to affected area 1-2 grams 3-4 times daily) 120 each 3  . simvastatin (ZOCOR) 40 MG tablet TAKE 1 TABLET 3 TIMES WEEKLY ON MONDAY, WEDNESDAY AND FRIDAY 39 tablet 0  . SYMBICORT 80-4.5 MCG/ACT inhaler TAKE 2 PUFFS BY MOUTH TWICE A DAY 30.6 Inhaler 1  . tamsulosin (FLOMAX) 0.4 MG CAPS capsule Take 1 capsule (0.4 mg total) by mouth daily. 90 capsule 3  . tiZANidine (ZANAFLEX) 4 MG tablet Take 4 mg by mouth 3 (three) times daily.     . VENTOLIN HFA 108 (90 Base) MCG/ACT inhaler TAKE 2 PUFFS BY MOUTH EVERY 6 HOURS AS NEEDED FOR WHEEZE OR SHORTNESS OF BREATH 18 Inhaler 2  . [DISCONTINUED] sildenafil (VIAGRA) 50 MG tablet Take 1 tablet (50 mg total) by mouth as needed. 5 tablet 3   No current facility-administered medications on file prior to visit.     Review of Systems  Constitutional: Negative for chills, fever, malaise/fatigue and weight loss.  Eyes: Negative for discharge and redness.  Respiratory: Negative for cough and shortness of breath.   Cardiovascular: Negative for chest pain and palpitations.  Gastrointestinal: Positive for diarrhea. Negative for abdominal pain, nausea and vomiting.       Loose stool one time today  Genitourinary: Positive for dysuria. Negative for flank pain, frequency and urgency.       Blood on skin of penis  Does not think there is blood in urine but cannot completely tell  Skin: Positive for itching and  rash.  Neurological: Negative for headaches.  Psychiatric/Behavioral:       Dementia    Observations/Objective: Patient appears well, in no distress Weight is baseline  No facial swelling or asymmetry Normal voice-not hoarse and no slurred speech No obvious tremor or mobility impairment Moving neck and UEs normally Hard of hearing- family helps with the call   No cough or shortness of breath during interview  Pleasant , with baseline dementia  No skin changes on face or neck , no rash or pallor Erythema noted on penis and scrotum -some in folds (camera could not get a good enough picture to clearly see any lesions or penile d/c) No excoriations noted Affect is normal    Assessment and Plan: Problem List Items Addressed This Visit      Musculoskeletal and Integument   Yeast dermatitis of penis - Primary    Clinical picture consistent with this (diabetic pt) Itchy/red skin with an area of bleeding on penis Px diflucan to take now and in 3 days Also nizoral 2% cream  inst to keep area as clean and dry as possible F/u if worse or no improvement       Relevant Medications   fluconazole (DIFLUCAN) 150 MG tablet   ketoconazole (NIZORAL) 2 % cream     Other   Blood in urine    This may be caused by cracked skin/bleeding on penis  No voiding symptoms  Urine culture sent to be sure  This will need to be re checked as well        Relevant Orders   Urine Culture    Other Visit Diagnoses    Dysuria       Relevant Orders   POCT Urinalysis Dipstick (Automated) (Completed)       Follow Up Instructions: I think you have a yeast infection in the groin area  and bleeding may come from irritation and scratching Keep area clean and as dry as possible  Take the diflucan as directed Try the nizoral cream  If no improvement please let us know  If worse red ness or swelling also let us know   I will send urine for a culture but do not strongly suspect a uti   If diarrhea  continues or worsens let us know Also if fever or any other new symptoms    I discussed the assessment and treatment plan with the patient. The patient was provided an opportunity to ask questions and all were answered. The patient agreed with the plan and demonstrated an understanding of the instructions.   The patient was advised to call back or seek an in-person evaluation if the symptoms worsen or if the condition fails to improve as anticipated.     Loura Pardon, MD

## 2019-02-15 NOTE — Telephone Encounter (Signed)
Alexander Salisbury LPN spoke with pt and scheduled virtual appt today at 4:15 with Dr Glori Bickers.

## 2019-02-15 NOTE — Assessment & Plan Note (Signed)
This may be caused by cracked skin/bleeding on penis  No voiding symptoms  Urine culture sent to be sure  This will need to be re checked as well

## 2019-02-15 NOTE — Telephone Encounter (Signed)
Best number [phone] Alexander Booth  (daughter)  Pt is having problem  Blood on end of penis Gave to Rena to Triage

## 2019-02-15 NOTE — Assessment & Plan Note (Signed)
Clinical picture consistent with this (diabetic pt) Itchy/red skin with an area of bleeding on penis Px diflucan to take now and in 3 days Also nizoral 2% cream  inst to keep area as clean and dry as possible F/u if worse or no improvement

## 2019-02-17 LAB — URINE CULTURE
MICRO NUMBER:: 707104
Result:: NO GROWTH
SPECIMEN QUALITY:: ADEQUATE

## 2019-02-18 ENCOUNTER — Telehealth: Payer: Self-pay

## 2019-02-18 ENCOUNTER — Telehealth: Payer: Self-pay | Admitting: Internal Medicine

## 2019-02-18 DIAGNOSIS — L84 Corns and callosities: Secondary | ICD-10-CM

## 2019-02-18 MED ORDER — CITALOPRAM HYDROBROMIDE 10 MG PO TABS: 10.0000 mg | ORAL_TABLET | Freq: Every day | ORAL | 0 refills | Status: DC

## 2019-02-18 NOTE — Telephone Encounter (Signed)
I have not received a refill request, Rx sent to mail order... We can see appt with Dr Amalia Hailey in chart

## 2019-02-18 NOTE — Telephone Encounter (Signed)
Patient's daughter, Alexander Booth, called.  [name] contacted her and told her they had sent several requests for a refill on Citalopram 10 mg. Patient has some of the medication left, but he needs a refill sent to [facility].   Patient needs a referral for [facility]. Patient has a place on his foot (?corn) that needs to be shaved off. Patient has an appointment with Dr.Brent Amalia Hailey on 02/22/19.

## 2019-02-19 ENCOUNTER — Other Ambulatory Visit: Payer: Self-pay | Admitting: Internal Medicine

## 2019-02-19 DIAGNOSIS — F0391 Unspecified dementia with behavioral disturbance: Secondary | ICD-10-CM

## 2019-02-19 DIAGNOSIS — F329 Major depressive disorder, single episode, unspecified: Secondary | ICD-10-CM

## 2019-02-19 DIAGNOSIS — F32A Depression, unspecified: Secondary | ICD-10-CM

## 2019-02-19 DIAGNOSIS — F03918 Unspecified dementia, unspecified severity, with other behavioral disturbance: Secondary | ICD-10-CM

## 2019-02-19 DIAGNOSIS — G4700 Insomnia, unspecified: Secondary | ICD-10-CM

## 2019-02-22 ENCOUNTER — Other Ambulatory Visit: Payer: Self-pay

## 2019-02-22 ENCOUNTER — Other Ambulatory Visit: Payer: Self-pay | Admitting: Internal Medicine

## 2019-02-22 ENCOUNTER — Ambulatory Visit: Payer: Medicare HMO | Admitting: Podiatry

## 2019-02-22 DIAGNOSIS — L989 Disorder of the skin and subcutaneous tissue, unspecified: Secondary | ICD-10-CM

## 2019-02-22 DIAGNOSIS — M7752 Other enthesopathy of left foot: Secondary | ICD-10-CM | POA: Diagnosis not present

## 2019-02-22 NOTE — Telephone Encounter (Signed)
Give 30 day supply.  Past due for follow up

## 2019-02-22 NOTE — Telephone Encounter (Signed)
What is the referral for specifically so I can put it in?

## 2019-02-22 NOTE — Telephone Encounter (Signed)
It is painful callus on bottom of foot

## 2019-02-22 NOTE — Telephone Encounter (Signed)
AWV reminder letter mailed as it is due in Oct... verbal okay for pt not to schedule f/u at this time and schedule physical

## 2019-02-22 NOTE — Telephone Encounter (Signed)
Pt called back and said they need a referral for Dr Amalia Hailey, podiatry due to insurance. Appt is today at 11am.

## 2019-02-23 NOTE — Addendum Note (Signed)
Addended by: [name] on: 02/23/2019 09:00 AM   Modules accepted: Orders

## 2019-02-24 ENCOUNTER — Telehealth: Payer: Self-pay | Admitting: Internal Medicine

## 2019-02-24 DIAGNOSIS — Z6826 Body mass index (BMI) 26.0-26.9, adult: Secondary | ICD-10-CM | POA: Diagnosis not present

## 2019-02-24 DIAGNOSIS — M4807 Spinal stenosis, lumbosacral region: Secondary | ICD-10-CM | POA: Diagnosis not present

## 2019-02-24 DIAGNOSIS — R030 Elevated blood-pressure reading, without diagnosis of hypertension: Secondary | ICD-10-CM | POA: Diagnosis not present

## 2019-02-24 NOTE — Progress Notes (Signed)
   Subjective: 73 year old male presenting today for follow up evaluation of porokeratosis and capsulitis of the 5th MPJ of the left foot. He states his pain improved for a while but the pain has returned in the past couple of week. Walking increases the pain. He states trimming the callus lesion and the injection helped alleviate the pain. He has not had any recent treatment. Patient is here for further evaluation and treatment.   Past Medical History:  Diagnosis Date  . COPD (chronic obstructive pulmonary disease) ([place])   . Depression   . Erectile dysfunction    He's had a hx of   . Hypercholesterolemia   . Hypertension    Essential  . Lumbosacral disc disease    with chronic low back pain, has implanted nerve stimulator  . Peripheral neuropathy   . Presenile dementia   . Type 2 diabetes mellitus ([place]) 2010   takes oral meds    Objective:  Physical Exam General: Alert and oriented x3 in no acute distress  Dermatology: Hyperkeratotic lesion present on the 5th MPJ of the left foot. Pain on palpation with a central nucleated core noted.  Skin is warm, dry and supple bilateral lower extremities. Negative for open lesions or macerations.  Vascular: Palpable pedal pulses bilaterally. No edema or erythema noted. Capillary refill within normal limits.  Neurological: Epicritic and protective threshold diminished bilaterally.   Musculoskeletal Exam: Pain on palpation at the keratotic lesion noted as well as to the 5th MPJ. Range of motion within normal limits bilateral. Muscle strength 5/5 in all groups bilateral.   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 Salinocaine applied.  #3 Dressed area with light  dressing. #4 Injection of 0.5 mLs Celestone Soluspan injected into the 5th MPJ of the left foot.  #5 Patient is to return to the clinic in three months for routine care.    Edrick Kins, DPM [facility]  Dr. Edrick Kins, [address]                                        [place], [place] [address]                Office [phone]  Fax [phone]

## 2019-02-24 NOTE — Telephone Encounter (Signed)
Pt's wife dropped off form for diabetic shoes for pt. Form placed in Rx tower for [facility].   CB [phone]

## 2019-02-27 ENCOUNTER — Other Ambulatory Visit: Payer: Self-pay | Admitting: Internal Medicine

## 2019-02-28 NOTE — Telephone Encounter (Signed)
Last filled 01/09/2019... please advise

## 2019-03-02 ENCOUNTER — Other Ambulatory Visit: Payer: Self-pay | Admitting: Internal Medicine

## 2019-03-11 ENCOUNTER — Telehealth: Payer: Self-pay | Admitting: *Deleted

## 2019-03-11 NOTE — Telephone Encounter (Signed)
I spoke with [name] and discussed pt coming in to talk about surgery with Dr. Amalia Hailey. Transferred to scheduler.

## 2019-03-11 NOTE — Telephone Encounter (Signed)
[name] states she is calling for Alexander Booth, because his foot is hurting very bad and he would like to schedule for surgery.

## 2019-03-17 ENCOUNTER — Telehealth: Payer: Self-pay | Admitting: Internal Medicine

## 2019-03-17 ENCOUNTER — Other Ambulatory Visit: Payer: Self-pay

## 2019-03-17 ENCOUNTER — Ambulatory Visit: Payer: Medicare HMO | Admitting: Podiatry

## 2019-03-17 DIAGNOSIS — M7752 Other enthesopathy of left foot: Secondary | ICD-10-CM | POA: Diagnosis not present

## 2019-03-17 DIAGNOSIS — M21622 Bunionette of left foot: Secondary | ICD-10-CM | POA: Diagnosis not present

## 2019-03-17 NOTE — Patient Instructions (Signed)
Pre-Operative Instructions  Congratulations, you have decided to take an important step towards improving your quality of life.  You can be assured that the doctors and staff at [facility] will be with you every step of the way.  Here are some important things you should know:  1. Plan to be at the surgery center/hospital at least 1 (one) hour prior to your scheduled time, unless otherwise directed by the surgical center/hospital staff.  You must have a responsible adult accompany you, remain during the surgery and drive you home.  Make sure you have directions to the surgical center/hospital to ensure you arrive on time. 2. If you are having surgery at [facility] or [facility] hospitals, you will need a copy of your medical history and physical form from your family physician within one month prior to the date of surgery. We will give you a form for your primary physician to complete.  3. We make every effort to accommodate the date you request for surgery.  However, there are times where surgery dates or times have to be moved.  We will contact you as soon as possible if a change in schedule is required.   4. No aspirin/ibuprofen for one week before surgery.  If you are on aspirin, any non-steroidal anti-inflammatory medications (Mobic, Aleve, Ibuprofen) should not be taken seven (7) days prior to your surgery.  You make take Tylenol for pain prior to surgery.  5. Medications - If you are taking daily heart and blood pressure medications, seizure, reflux, allergy, asthma, anxiety, pain or diabetes medications, make sure you notify the surgery center/hospital before the day of surgery so they can tell you which medications you should take or avoid the day of surgery. 6. No food or drink after midnight the night before surgery unless directed otherwise by surgical center/hospital staff. 7. No alcoholic beverages 24-hours prior to surgery.  No smoking 24-hours prior or 24-hours after  surgery. 8. Wear loose pants or shorts. They should be loose enough to fit over bandages, boots, and casts. 9. Don't wear slip-on shoes. Sneakers are preferred. 10. Bring your boot with you to the surgery center/hospital.  Also bring crutches or a walker if your physician has prescribed it for you.  If you do not have this equipment, it will be provided for you after surgery. 11. If you have not been contacted by the surgery center/hospital by the day before your surgery, call to confirm the date and time of your surgery. 12. Leave-time from work may vary depending on the type of surgery you have.  Appropriate arrangements should be made prior to surgery with your employer. 13. Prescriptions will be provided immediately following surgery by your doctor.  Fill these as soon as possible after surgery and take the medication as directed. Pain medications will not be refilled on weekends and must be approved by the doctor. 14. Remove nail polish on the operative foot and avoid getting pedicures prior to surgery. 15. Wash the night before surgery.  The night before surgery wash the foot and leg well with water and the antibacterial soap provided. Be sure to pay special attention to beneath the toenails and in between the toes.  Wash for at least three (3) minutes. Rinse thoroughly with water and dry well with a towel.  Perform this wash unless told not to do so by your physician.  Enclosed: 1 Ice pack (please put in freezer the night before surgery)   1 Hibiclens skin cleaner     Pre-op instructions  If you have any questions regarding the instructions, please do not hesitate to call our office.  [place]: [address], [place], [place] [address] -- [phone]  [place]: [address] [place], [place] [address] -- [phone]  [place]: [address]  [place], [place] [address] -- [phone]  [place]: [address], [address], [place], [place] [address] -- [phone]  Website:  [email or web address] 

## 2019-03-17 NOTE — Telephone Encounter (Signed)
[name] (daughter)  Best number [phone]  [facility] has been faxing paperwork since march to get shoe for pt with no response.  She stated her mom brought paperwork here 2 weeks and [facility] never received this

## 2019-03-17 NOTE — Telephone Encounter (Signed)
See phone note 8/5

## 2019-03-20 NOTE — Progress Notes (Signed)
   Subjective: 73 year old male with PMHx of T2DM presenting today for a surgical consult of the left foot. He reports continued pain from capsulitis and porokeratosis of the 5th MPJ. Walking and wearing shoes increases the pain. He states the injection provided mild temporarily relief after receiving the injection. Patient is here for further evaluation and treatment.   Past Medical History:  Diagnosis Date  . COPD (chronic obstructive pulmonary disease) ([place])   . Depression   . Erectile dysfunction    He's had a hx of   . Hypercholesterolemia   . Hypertension    Essential  . Lumbosacral disc disease    with chronic low back pain, has implanted nerve stimulator  . Peripheral neuropathy   . Presenile dementia   . Type 2 diabetes mellitus ([place]) 2010   takes oral meds    Objective:  Physical Exam General: Alert and oriented x3 in no acute distress  Dermatology: Hyperkeratotic lesion present on the 5th MPJ of the left foot. Pain on palpation with a central nucleated core noted.  Skin is warm, dry and supple bilateral lower extremities. Negative for open lesions or macerations.  Vascular: Palpable pedal pulses bilaterally. No edema or erythema noted. Capillary refill within normal limits.  Neurological: Epicritic and protective threshold diminished bilaterally.   Musculoskeletal Exam: Pain on palpation at the keratotic lesion noted as well as to the 5th MPJ. Clinical evidence of Tailor's bunion deformity noted to the respective foot. There is a moderate pain on palpation range of motion of the fifth MPJ.  Range of motion within normal limits bilateral. Muscle strength 5/5 in all groups bilateral.   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 The patient opts for surgical management. All possible complications and details of the procedure were explained. All patient questions were answered. No guarantees were expressed or implied. #3 Authorization for surgery was initiated today. Surgery will consist of 5th metatarsal head resection left.  #4 Return to clinic one week post op.   Edrick Kins, DPM [facility]  Dr. Edrick Kins, [place]                                        [place], [place] [address]                Office [phone]  Fax [phone]

## 2019-03-22 NOTE — Telephone Encounter (Signed)
Spoke to daughter [name] and she is aware the form with notes have been faxed 3 more times. I will check with them tomorrow to see if they have received the order

## 2019-03-22 NOTE — Telephone Encounter (Signed)
[name] has an appointment to go to please call pt spouse Vaughan Basta [phone]

## 2019-03-25 ENCOUNTER — Telehealth: Payer: Self-pay | Admitting: *Deleted

## 2019-03-25 ENCOUNTER — Telehealth: Payer: Self-pay | Admitting: Podiatry

## 2019-03-25 NOTE — Telephone Encounter (Signed)
DOS 03/31/2019  METATARSAL HEAD RESECTION LT FOOT - [id number]  [employer]: Effective Date -  Jul 22, 2017  Co-Insurance - Surgical  In Network Individual  Insurance Type Health Maintenance Organization (HMO) - [place] SPECIALIST  0 %  Calendar Year   Out of Pocket (Stop Loss) - Health Benefit Plan Coverage  In Network Individual  $3,400.00  - $430.26  Year to Date  $2,969.74  Remaining

## 2019-03-25 NOTE — Telephone Encounter (Signed)
pts daughter called about diabetic shoes status. She talked with pts doctor and they said they have sent the paperwork back.   I returned call and we did get the paperwork but the last office visit with the md wad 12 2018 and it has to be within 6 months. She is going to call Dr Copland's office and get an appt for the pt.

## 2019-03-30 ENCOUNTER — Ambulatory Visit: Payer: Medicare HMO | Admitting: Internal Medicine

## 2019-03-30 NOTE — Telephone Encounter (Signed)
I called [employer] and spoke to Diller.  He stated that authorization was NOT REQUIRED.  The ref. # for the call is [id number].

## 2019-03-30 NOTE — Telephone Encounter (Signed)
Left message on voicemail letting them know to go ahead and schedule OV with Dr Lorelei Pont for Diabetic shoe order Cop[facility] has done in the past as MD signature is required

## 2019-03-31 ENCOUNTER — Encounter: Payer: Self-pay | Admitting: Podiatry

## 2019-03-31 DIAGNOSIS — M2012 Hallux valgus (acquired), left foot: Secondary | ICD-10-CM | POA: Diagnosis not present

## 2019-03-31 DIAGNOSIS — M7752 Other enthesopathy of left foot: Secondary | ICD-10-CM | POA: Diagnosis not present

## 2019-03-31 DIAGNOSIS — I10 Essential (primary) hypertension: Secondary | ICD-10-CM | POA: Diagnosis not present

## 2019-03-31 DIAGNOSIS — M21622 Bunionette of left foot: Secondary | ICD-10-CM | POA: Diagnosis not present

## 2019-04-01 ENCOUNTER — Other Ambulatory Visit: Payer: Self-pay | Admitting: Podiatry

## 2019-04-01 ENCOUNTER — Telehealth: Payer: Self-pay | Admitting: Podiatry

## 2019-04-01 ENCOUNTER — Other Ambulatory Visit: Payer: Self-pay | Admitting: Internal Medicine

## 2019-04-01 MED ORDER — OXYCODONE-ACETAMINOPHEN 5-325 MG PO TABS: 1.0000 | ORAL_TABLET | Freq: Four times a day (QID) | ORAL | 0 refills | Status: DC | PRN

## 2019-04-01 NOTE — Telephone Encounter (Signed)
Pt had sx yesterday. Daughter went to pick up pain Rx at [facility] but nothing was e-scribed. She requested a call when filled.

## 2019-04-01 NOTE — Progress Notes (Signed)
.  postop

## 2019-04-01 NOTE — Telephone Encounter (Signed)
Tried calling daughter to let her know Rx has been sent to pharmacy. Phone just rang with no answer/answering machine.

## 2019-04-01 NOTE — Telephone Encounter (Signed)
Pt's daughter called back saying pain Rx still has not been called in. Pt had surgery yesterday and is in a lot of pain.

## 2019-04-01 NOTE — Telephone Encounter (Signed)
Rx for percocet sent to pharmacy. Will you please call the patient and let them know. Thanks, - Dr. Amalia Hailey

## 2019-04-01 NOTE — Telephone Encounter (Signed)
Attempted to call daughter but phone rang with no answer/answering machine. Will try calling back.

## 2019-04-02 NOTE — Telephone Encounter (Signed)
Last filled 03/01/2019... please advise

## 2019-04-07 ENCOUNTER — Other Ambulatory Visit: Payer: Self-pay

## 2019-04-07 ENCOUNTER — Ambulatory Visit (INDEPENDENT_AMBULATORY_CARE_PROVIDER_SITE_OTHER): Payer: Medicare HMO | Admitting: Podiatry

## 2019-04-07 ENCOUNTER — Ambulatory Visit (INDEPENDENT_AMBULATORY_CARE_PROVIDER_SITE_OTHER): Payer: Medicare HMO

## 2019-04-07 VITALS — Temp 97.7°F

## 2019-04-07 DIAGNOSIS — M21622 Bunionette of left foot: Secondary | ICD-10-CM | POA: Diagnosis not present

## 2019-04-07 MED ORDER — DOXYCYCLINE HYCLATE 100 MG PO TABS: 100.0000 mg | ORAL_TABLET | Freq: Two times a day (BID) | ORAL | 0 refills | Status: DC

## 2019-04-08 DIAGNOSIS — M21622 Bunionette of left foot: Secondary | ICD-10-CM | POA: Diagnosis not present

## 2019-04-10 NOTE — Progress Notes (Signed)
   Subjective:  Patient presents today status post 5th metatarsal head resection left. DOS: 03/31/2019. He states he is doing well. He denies any significant pain or modifying factors. He has been using the post op shoe as directed. Patient is here for further evaluation and treatment.    Past Medical History:  Diagnosis Date  . COPD (chronic obstructive pulmonary disease) ([place])   . Depression   . Erectile dysfunction    He's had a hx of   . Hypercholesterolemia   . Hypertension    Essential  . Lumbosacral disc disease    with chronic low back pain, has implanted nerve stimulator  . Peripheral neuropathy   . Presenile dementia   . Type 2 diabetes mellitus ([place]) 2010   takes oral meds      Objective/Physical Exam Neurovascular status intact.  Skin incisions appear to be well coapted with sutures and staples intact. No sign of infectious process noted. No dehiscence. No active bleeding noted. Moderate edema noted to the surgical extremity.  Radiographic Exam:  Osteotomies sites appear to be stable with routine healing.  Assessment: 1. s/p 5th metatarsal head resection left. DOS: 03/31/2019   Plan of Care:  1. Patient was evaluated. X-rays reviewed 2. Dressing changed.  3. Prescription for Doxycycline 100 mg #14 provided to patient.  4. Continue using post op shoe.  5. Return to clinic in one week.    Edrick Kins, DPM [facility]  Dr. Edrick Kins, [address]                                        [place], [place] [address]                Office [phone]  Fax [phone]

## 2019-04-12 ENCOUNTER — Ambulatory Visit (INDEPENDENT_AMBULATORY_CARE_PROVIDER_SITE_OTHER): Payer: Medicare HMO | Admitting: Family Medicine

## 2019-04-12 ENCOUNTER — Encounter: Payer: Self-pay | Admitting: Family Medicine

## 2019-04-12 VITALS — BP 120/70 | HR 66 | Temp 97.6°F | Ht 71.5 in | Wt 186.5 lb

## 2019-04-12 DIAGNOSIS — E118 Type 2 diabetes mellitus with unspecified complications: Secondary | ICD-10-CM | POA: Diagnosis not present

## 2019-04-12 DIAGNOSIS — E1140 Type 2 diabetes mellitus with diabetic neuropathy, unspecified: Secondary | ICD-10-CM

## 2019-04-12 DIAGNOSIS — Z23 Encounter for immunization: Secondary | ICD-10-CM | POA: Diagnosis not present

## 2019-04-12 NOTE — Progress Notes (Signed)
Alexander Tidd T. Alexander Winthrop, MD Primary Care and [place] at [facility] [place], [address] Phone: [phone]  FAX: [place] - 73 y.o. male  MRN [id number]  Date of Birth: 06-21-1946  Visit Date: 04/12/2019  PCP: [name], NP  Referred by: [name], NP  Chief Complaint  Patient presents with  . Diabetes    Discuss Diabetic Shoes   Subjective:   Alexander Booth is a 73 y.o. very pleasant male patient who presents with the following:  Diabetes Mellitus: Tolerating Medications: yes Compliance with diet: fair, Body mass index is 25.65 kg/m. Exercise: minimal / intermittent Avg blood sugars at home: not checking Foot problems: none Hypoglycemia: none No nausea, vomitting, blurred vision, polyuria.  He has need for diabetic shoes  Diabetic neuropathy  The patient presents for an evaluation of diabetes as well as evaluation for the need of diabetic shoes.  He does have diabetic neuropathy and has had this for a long time.  Lab Results  Component Value Date   HGBA1C 6.2 03/20/2017   HGBA1C 6.1 (H) 12/17/2016   HGBA1C 5.9 02/26/2016   Lab Results  Component Value Date   MICROALBUR <0.7 03/20/2017   LDLCALC 97 03/20/2017   CREATININE 1.17 11/11/2018    Wt Readings from Last 3 Encounters:  04/12/19 186 lb 8 oz (84.6 kg)  02/15/19 187 lb (84.8 kg)  09/22/18 200 lb (90.7 kg)     Past Medical History, Surgical History, Social History, Family History, Problem List, Medications, and Allergies have been reviewed and updated if relevant.  Patient Active Problem List   Diagnosis Date Noted  . Blood in urine 02/15/2019  . Yeast dermatitis of penis 02/15/2019  . Depression, recurrent ([place]) 05/16/2018  . Tobacco dependence 12/17/2016  . Dyslipidemia 01/13/2016  . Essential hypertension 01/13/2016  . Peripheral neuropathy 01/13/2016  . 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([place])  01/10/2016  . Sleep disturbance 10/25/2014  . Emphysema of lung ([place]) 10/25/2014  . Chronic back pain 10/26/2013  . Type 2 diabetes mellitus with complication ([place]) 44/31/5400  . Osteoarthritis 06/18/2011    Past Medical History:  Diagnosis Date  . COPD (chronic obstructive pulmonary disease) ([place])   . Depression   . Erectile dysfunction    He's had a hx of   . Hypercholesterolemia   . Hypertension    Essential  . Lumbosacral disc disease    with chronic low back pain, has implanted nerve stimulator  . Peripheral neuropathy   . Presenile dementia   . Type 2 diabetes mellitus ([place]) 2010   takes oral meds    Past Surgical History:  Procedure Laterality Date  . ANTERIOR CERVICAL DECOMP/DISCECTOMY FUSION    . APPENDECTOMY    . HEMORRHOID SURGERY     x2  . JOINT REPLACEMENT Left    2nd one was to change siZe  . KNEE SURGERY     Previous left total replacement  . KYPHOPLASTY N/A 12/10/2012   Procedure: Lumbar one Kyphoplasty;  Surgeon: Kristeen Miss, MD;  Location: [facility];  Service: Neurosurgery;  Laterality: N/A;  Lumbar One Kyphoplasty  . OTHER SURGICAL HISTORY  1992   Bone Fusion in Right Foot  . SPINAL CORD STIMULATOR INSERTION N/A 12/02/2014   Procedure: THORACIC SPINAL CORD STIMULATOR INSERTION INFINION [employer] 16 LEAD WITH LAMINECTOMY;  Surgeon: Erline Levine, MD;  Location: [facility];  Service: Neurosurgery;  Laterality: N/A;  THORACIC  SPINAL CORD STIMULATOR INSERTION INFINION [employer] 16 LEAD WITH LAMINECTOMY  . TOTAL KNEE ARTHROPLASTY Left     Social History   Socioeconomic History  . Marital status: Married    Spouse name: Vaughan Basta  . Number of children: 3  . Years of education: 64  . Highest education level: Not on file  Occupational History  . Occupation: retired    Comment: retired  Scientific laboratory technician  . Financial resource strain: Not on file  . Food insecurity    Worry: Not on file    Inability: Not on file  . Transportation needs     Medical: Not on file    Non-medical: Not on file  Tobacco Use  . Smoking status: Current Every Day Smoker    Packs/day: 1.00    Years: 40.00    Pack years: 40.00  . Smokeless tobacco: Never Used  Substance and Sexual Activity  . Alcohol use: No    Alcohol/week: 0.0 standard drinks  . Drug use: No  . Sexual activity: Not on file  Lifestyle  . Physical activity    Days per week: Not on file    Minutes per session: Not on file  . Stress: Not on file  Relationships  . Social Herbalist on phone: Not on file    Gets together: Not on file    Attends religious service: Not on file    Active member of club or organization: Not on file    Attends meetings of clubs or organizations: Not on file    Relationship status: Not on file  . Intimate partner violence    Fear of current or ex partner: Not on file    Emotionally abused: Not on file    Physically abused: Not on file    Forced sexual activity: Not on file  Other Topics Concern  . Not on file  Social History Narrative   Patient is right handed   Patient resides with wife,consumes 2-3 cups of caffeine daily    Family History  Problem Relation Age of Onset  . Multiple myeloma Mother   . Diabetes Mother   . Cancer Sister        lymphoma  . Diabetes Sister   . Stroke Neg Hx     Allergies  Allergen Reactions  . Codeine Hives and Itching    Medication list reviewed and updated in full in [place].   GEN: No acute illnesses, no fevers, chills. GI: No n/v/d, eating normally Pulm: No SOB Interactive and getting along well at home.  Otherwise, ROS is as per the HPI.  Objective:   BP 120/70   Pulse 66   Temp 97.6 F (36.4 C) (Temporal)   Ht 5' 11.5" (1.816 m)   Wt 186 lb 8 oz (84.6 kg)   SpO2 91%   BMI 25.65 kg/m   GEN: WDWN, NAD, Non-toxic, A & O x 3 HEENT: Atraumatic, Normocephalic. Neck supple. No masses, No LAD. Ears and Nose: No external deformity. CV: RRR, No M/G/R. No JVD. No  thrill. No extra heart sounds. PULM: CTA B, no wheezes, crackles, rhonchi. No retractions. No resp. distress. No accessory muscle use. EXTR: No c/c/e PSYCH: Normally interactive. Conversant. Not depressed or anxious appearing.  Calm demeanor.   Notably decreased sensation in both feet, particularly on the plantar aspect, but decreased overall.  He also has a bluish cool skin coloration.  Laboratory and Imaging Data:  Assessment and Plan:  ICD-10-CM   1. Type 2 diabetes mellitus with diabetic neuropathy, without long-term current use of insulin (HCC)  E11.40   2. Type 2 diabetes mellitus with complication (HCC)  T73.2   3. Need for influenza vaccination  Z23 Flu Vaccine QUAD High Dose([facility])   Diabetic neuropathy, relatively well controlled diabetes, decreased sensation throughout his entire bilaterally feet.  He certainly meets criteria for diabetic shoes.  Follow-up: No follow-ups on file.  No orders of the defined types were placed in this encounter.  Orders Placed This Encounter  Procedures  . Flu Vaccine QUAD High Dose(Fluad)    Signed,  Tylan Kinn T. Ahnesty Finfrock, MD   Outpatient Encounter Medications as of 04/12/2019  Medication Sig  . Accu-Chek Softclix Lancets lancets Check sugar 2 times daily. DX E11.8  . aspirin 81 MG chewable tablet Chew 81 mg by mouth daily.   . bisacodyl (DULCOLAX) 5 MG EC tablet Take 5 mg by mouth daily as needed for mild constipation.   . Blood Glucose Monitoring Suppl (ACCU-CHEK AVIVA PLUS) w/Device KIT Check sugar 2 times daily. DX E11.8  . BREO ELLIPTA 100-25 MCG/INH AEPB INHALE 1 PUFF BY MOUTH EVERY DAY  . citalopram (CELEXA) 10 MG tablet Take 1 tablet (10 mg total) by mouth daily.  . cyanocobalamin (,VITAMIN B-12,) 1000 MCG/ML injection Inject 1,000 mcg into the muscle every 30 (thirty) days.  . divalproex (DEPAKOTE ER) 500 MG 24 hr tablet TAKE 2 TABLETS EVERY DAY  . donepezil (ARICEPT) 10 MG tablet TAKE 1 TABLET EVERY MORNING  . doxycycline  (VIBRA-TABS) 100 MG tablet Take 1 tablet (100 mg total) by mouth 2 (two) times daily.  . fluconazole (DIFLUCAN) 150 MG tablet Take one pill by mouth now and one in 3 days  . furosemide (LASIX) 20 MG tablet TAKE 1 TABLET BY MOUTH EVERY DAY  . gabapentin (NEURONTIN) 300 MG capsule TAKE 1 CAPSULE TWICE DAILY  . gabapentin (NEURONTIN) 400 MG capsule TAKE 3 CAPSULES AT BEDTIME AND MAY REPEAT DOSE ONE TIME IF NEEDED  . glucose blood (ACCU-CHEK AVIVA PLUS) test strip Check sugar 2 times daily. DX E11.8  . hydrochlorothiazide (HYDRODIURIL) 25 MG tablet   . HYDROcodone-acetaminophen (NORCO) 10-325 MG tablet Take 1 tablet by mouth every 6 (six) hours.  Marland Kitchen ketoconazole (NIZORAL) 2 % cream Apply 1 application topically daily. To affected area  . levofloxacin (LEVAQUIN) 250 MG tablet Take 2 tabs po on day 1, then 1 tab po daily x 6 days  . LORazepam (ATIVAN) 0.5 MG tablet TAKE 1 TABLET BY MOUTH EVERY DAY AS NEEDED FOR ANXIETY  . meloxicam (MOBIC) 15 MG tablet Take 1 tablet (15 mg total) by mouth daily.  . memantine (NAMENDA XR) 7 MG CP24 24 hr capsule TAKE 1 CAPSULE (7 MG TOTAL) BY MOUTH EVERY MORNING.  . metFORMIN (GLUCOPHAGE) 500 MG tablet TAKE 1 TABLET TWICE DAILY  . mirtazapine (REMERON) 45 MG tablet TAKE 1 TABLET AT BEDTIME  . nitroGLYCERIN (NITROSTAT) 0.4 MG SL tablet Place 0.4 mg under the tongue every 5 (five) minutes as needed for chest pain.   . NONFORMULARY OR COMPOUNDED ITEM [facility]:  Peripheral Neuropathy cream - Bupivacaine 1%, Doxepin 3%, Gabapentin 6%, Pentoxifylline 3%, Topiramate 1%, apply to affected area 1-2 grams 3-4 times daily (Patient taking differently: Apply 1 application topically daily as needed (PAIN). [facility]:  Peripheral Neuropathy cream - Bupivacaine 1%, Doxepin 3%, Gabapentin 6%, Pentoxifylline 3%, Topiramate 1%, apply to affected area 1-2 grams 3-4 times daily)  . oxyCODONE-acetaminophen (PERCOCET) 5-325 MG  tablet Take 1 tablet by mouth every 6 (six) hours as  needed for severe pain.  . simvastatin (ZOCOR) 40 MG tablet TAKE 1 TABLET 3 TIMES WEEKLY ON MONDAY, WEDNESDAY AND FRIDAY  . SYMBICORT 80-4.5 MCG/ACT inhaler TAKE 2 PUFFS BY MOUTH TWICE A DAY  . tamsulosin (FLOMAX) 0.4 MG CAPS capsule Take 1 capsule (0.4 mg total) by mouth daily.  Marland Kitchen tiZANidine (ZANAFLEX) 4 MG tablet Take 4 mg by mouth 3 (three) times daily.   . VENTOLIN HFA 108 (90 Base) MCG/ACT inhaler TAKE 2 PUFFS BY MOUTH EVERY 6 HOURS AS NEEDED FOR WHEEZE OR SHORTNESS OF BREATH  . [DISCONTINUED] sildenafil (VIAGRA) 50 MG tablet Take 1 tablet (50 mg total) by mouth as needed.   No facility-administered encounter medications on file as of 04/12/2019.

## 2019-04-14 ENCOUNTER — Ambulatory Visit (INDEPENDENT_AMBULATORY_CARE_PROVIDER_SITE_OTHER): Payer: Medicare HMO | Admitting: Podiatry

## 2019-04-14 ENCOUNTER — Other Ambulatory Visit: Payer: Self-pay

## 2019-04-14 DIAGNOSIS — Z9889 Other specified postprocedural states: Secondary | ICD-10-CM

## 2019-04-14 DIAGNOSIS — M21622 Bunionette of left foot: Secondary | ICD-10-CM

## 2019-04-17 ENCOUNTER — Other Ambulatory Visit: Payer: Self-pay | Admitting: Internal Medicine

## 2019-04-18 NOTE — Progress Notes (Signed)
   Subjective:  Patient presents today status post 5th metatarsal head resection left. DOS: 03/31/2019. He states he is doing well. He denies any significant pain or modifying factors. He has been using the post op shoe as directed. Patient is here for further evaluation and treatment.     Past Medical History:  Diagnosis Date  . COPD (chronic obstructive pulmonary disease) ([place])   . Depression   . Erectile dysfunction    He's had a hx of   . Hypercholesterolemia   . Hypertension    Essential  . Lumbosacral disc disease    with chronic low back pain, has implanted nerve stimulator  . Peripheral neuropathy   . Presenile dementia   . Type 2 diabetes mellitus ([place]) 2010   takes oral meds      Objective/Physical Exam Neurovascular status intact.  Skin incisions appear to be well coapted with sutures and staples intact. No sign of infectious process noted. No dehiscence. No active bleeding noted. Moderate edema noted to the surgical extremity.  Assessment: 1. s/p 5th metatarsal head resection left. DOS: 03/31/2019   Plan of Care:  1. Patient was evaluated.  2. Staples removed.  3. Resume wearing DM shoes.  4. May resume full activity with no restrictions.  5. Return to clinic as needed.    Edrick Kins, DPM [facility]  Dr. Edrick Kins, [facility]                                        [place], [place] [address]                Office [phone]  Fax [phone]

## 2019-04-21 ENCOUNTER — Other Ambulatory Visit: Payer: Self-pay | Admitting: Internal Medicine

## 2019-04-24 ENCOUNTER — Other Ambulatory Visit: Payer: Self-pay | Admitting: Internal Medicine

## 2019-04-27 ENCOUNTER — Other Ambulatory Visit: Payer: Self-pay

## 2019-04-27 ENCOUNTER — Encounter: Payer: Self-pay | Admitting: Internal Medicine

## 2019-04-27 ENCOUNTER — Ambulatory Visit (INDEPENDENT_AMBULATORY_CARE_PROVIDER_SITE_OTHER): Payer: Medicare HMO | Admitting: Internal Medicine

## 2019-04-27 ENCOUNTER — Ambulatory Visit (INDEPENDENT_AMBULATORY_CARE_PROVIDER_SITE_OTHER)
Admission: RE | Admit: 2019-04-27 | Discharge: 2019-04-27 | Disposition: A | Discharge status: Home / Self Care | Payer: Medicare HMO | Source: Ambulatory Visit | Attending: Internal Medicine | Admitting: Internal Medicine

## 2019-04-27 VITALS — BP 122/72 | HR 88 | Temp 98.3°F | Wt 186.0 lb

## 2019-04-27 DIAGNOSIS — E119 Type 2 diabetes mellitus without complications: Secondary | ICD-10-CM | POA: Diagnosis not present

## 2019-04-27 DIAGNOSIS — R531 Weakness: Secondary | ICD-10-CM

## 2019-04-27 DIAGNOSIS — J432 Centrilobular emphysema: Secondary | ICD-10-CM

## 2019-04-27 DIAGNOSIS — R251 Tremor, unspecified: Secondary | ICD-10-CM | POA: Diagnosis not present

## 2019-04-27 DIAGNOSIS — R0602 Shortness of breath: Secondary | ICD-10-CM

## 2019-04-27 DIAGNOSIS — F411 Generalized anxiety disorder: Secondary | ICD-10-CM | POA: Diagnosis not present

## 2019-04-27 DIAGNOSIS — R109 Unspecified abdominal pain: Secondary | ICD-10-CM

## 2019-04-27 DIAGNOSIS — K5901 Slow transit constipation: Secondary | ICD-10-CM

## 2019-04-27 DIAGNOSIS — R42 Dizziness and giddiness: Secondary | ICD-10-CM

## 2019-04-27 DIAGNOSIS — R269 Unspecified abnormalities of gait and mobility: Secondary | ICD-10-CM

## 2019-04-27 LAB — LIPID PANEL
Cholesterol: 201 mg/dL — ABNORMAL HIGH (ref 0–200)
HDL: 33.5 mg/dL — ABNORMAL LOW (ref >39.00)
NonHDL: 167.46
Total CHOL/HDL Ratio: 6
Triglycerides: 378 mg/dL — ABNORMAL HIGH (ref 0.0–149.0)
VLDL: 75.6 mg/dL — ABNORMAL HIGH (ref 0.0–40.0)

## 2019-04-27 LAB — COMPREHENSIVE METABOLIC PANEL
ALT: 17 U/L (ref 0–53)
AST: 16 U/L (ref 0–37)
Albumin: 4.5 g/dL (ref 3.5–5.2)
Alkaline Phosphatase: 47 U/L (ref 39–117)
BUN: 20 mg/dL (ref 6–23)
CO2: 31 mEq/L (ref 19–32)
Calcium: 9.7 mg/dL (ref 8.4–10.5)
Chloride: 101 mEq/L (ref 96–112)
Creatinine, Ser: 1.21 mg/dL (ref 0.40–1.50)
GFR: 58.38 mL/min — ABNORMAL LOW (ref >60.00)
Glucose, Bld: 137 mg/dL — ABNORMAL HIGH (ref 70–99)
Potassium: 4.5 mEq/L (ref 3.5–5.1)
Sodium: 141 mEq/L (ref 135–145)
Total Bilirubin: 0.4 mg/dL (ref 0.2–1.2)
Total Protein: 7 g/dL (ref 6.0–8.3)

## 2019-04-27 LAB — VITAMIN B12: Vitamin B-12: 261 pg/mL (ref 211–911)

## 2019-04-27 LAB — TSH: TSH: 1.96 u[IU]/mL (ref 0.35–4.50)

## 2019-04-27 LAB — CBC
HCT: 41.5 % (ref 39.0–52.0)
Hemoglobin: 14.1 g/dL (ref 13.0–17.0)
MCHC: 33.9 g/dL (ref 30.0–36.0)
MCV: 98 fl (ref 78.0–100.0)
Platelets: 185 10*3/uL (ref 150.0–400.0)
RBC: 4.23 Mil/uL (ref 4.22–5.81)
RDW: 13.3 % (ref 11.5–15.5)
WBC: 7.7 10*3/uL (ref 4.0–10.5)

## 2019-04-27 LAB — HEMOGLOBIN A1C: Hgb A1c MFr Bld: 7.7 % — ABNORMAL HIGH (ref 4.6–6.5)

## 2019-04-27 LAB — VITAMIN D 25 HYDROXY (VIT D DEFICIENCY, FRACTURES): VITD: 22.36 ng/mL — ABNORMAL LOW (ref 30.00–100.00)

## 2019-04-27 LAB — LDL CHOLESTEROL, DIRECT: Direct LDL: 92 mg/dL

## 2019-04-27 NOTE — Progress Notes (Signed)
Subjective:    Patient ID: Alexander Booth, male    DOB: 1945-10-08, 73 y.o.   MRN: [id number]  HPI  Pt presents to the clinic today with generally just not feeling well. He reports increased SOB. He denies cough. He has a history of COPD (centrilobular emphysema), managed on Symbicort and Albuterol. He does continue to smoke. CT chest from 09/2017 reviewed. Chest xray from 09/2018 reviewed. He is not following with pulmonology. There are no PFT's on file. He reports increased weakness, tremor and feelings of dizziness. He reports the dizziness is a sensation of imbalance, not that the room is spinning. He gets this sensation when he goes from a sitting to a standing position.  He denies diaphoresis or chest pain but reports he feels like he is going to pass out. He denies recent falls. His tremor has mainly been in his right arm, but is now in his left arm and bilateral legs at times. He is unable to control this. He has seen neurology in the past, but has no history of Parkinson's. He reports lower abdominal pain, intermittent constipation, having to strain more with a BM. He denies blood in his stool. He is taking a laxative 3-4 times per week. He reports his appetite is poor but he denies weight loss. He has no desire to eat, despite Mirtazapine. He reports he can still taste his food. He also reports worsening anxiety. He knows that he is declining and can't do things he used to do. He is taking Citalopram and Lorazepam, but is unsure if it is helpful. He does have some depression, but denies SI/HI.  Review of Systems      Past Medical History:  Diagnosis Date  . COPD (chronic obstructive pulmonary disease) ([place])   . Depression   . Erectile dysfunction    He's had a hx of   . Hypercholesterolemia   . Hypertension    Essential  . Lumbosacral disc disease    with chronic low back pain, has implanted nerve stimulator  . Peripheral neuropathy   . Presenile dementia   . Type 2 diabetes  mellitus ([place]) 2010   takes oral meds    Current Outpatient Medications  Medication Sig Dispense Refill  . Accu-Chek Softclix Lancets lancets Check sugar 2 times daily. DX E11.8 200 each 3  . aspirin 81 MG chewable tablet Chew 81 mg by mouth daily.     . bisacodyl (DULCOLAX) 5 MG EC tablet Take 5 mg by mouth daily as needed for mild constipation.     . Blood Glucose Monitoring Suppl (ACCU-CHEK AVIVA PLUS) w/Device KIT Check sugar 2 times daily. DX E11.8 1 kit 12  . BREO ELLIPTA 100-25 MCG/INH AEPB INHALE 1 PUFF BY MOUTH EVERY DAY 60 each 2  . citalopram (CELEXA) 10 MG tablet TAKE 1 TABLET (10 MG TOTAL) BY MOUTH DAILY. 90 tablet 0  . cyanocobalamin (,VITAMIN B-12,) 1000 MCG/ML injection Inject 1,000 mcg into the muscle every 30 (thirty) days.    . divalproex (DEPAKOTE ER) 500 MG 24 hr tablet TAKE 2 TABLETS EVERY DAY 180 tablet 1  . donepezil (ARICEPT) 10 MG tablet TAKE 1 TABLET EVERY MORNING 90 tablet 0  . furosemide (LASIX) 20 MG tablet TAKE 1 TABLET BY MOUTH EVERY DAY 90 tablet 0  . gabapentin (NEURONTIN) 400 MG capsule TAKE 3 CAPSULES AT BEDTIME AND MAY REPEAT DOSE ONE TIME IF NEEDED 270 capsule 1  . glucose blood (ACCU-CHEK AVIVA PLUS) test strip Check sugar 2  times daily. DX E11.8 200 each 3  . HYDROcodone-acetaminophen (NORCO) 10-325 MG tablet Take 1 tablet by mouth every 6 (six) hours.    Marland Kitchen ketoconazole (NIZORAL) 2 % cream Apply 1 application topically daily. To affected area 15 g 0  . LORazepam (ATIVAN) 0.5 MG tablet TAKE 1 TABLET BY MOUTH EVERY DAY AS NEEDED FOR ANXIETY 20 tablet 0  . meloxicam (MOBIC) 15 MG tablet Take 1 tablet (15 mg total) by mouth daily. 90 tablet 3  . memantine (NAMENDA XR) 7 MG CP24 24 hr capsule TAKE 1 CAPSULE (7 MG TOTAL) BY MOUTH EVERY MORNING. 90 capsule 2  . metFORMIN (GLUCOPHAGE) 500 MG tablet TAKE 1 TABLET TWICE DAILY 180 tablet 0  . mirtazapine (REMERON) 45 MG tablet TAKE 1 TABLET AT BEDTIME 90 tablet 0  . nitroGLYCERIN (NITROSTAT) 0.4 MG SL tablet  Place 0.4 mg under the tongue every 5 (five) minutes as needed for chest pain.     . NONFORMULARY OR COMPOUNDED ITEM [facility]:  Peripheral Neuropathy cream - Bupivacaine 1%, Doxepin 3%, Gabapentin 6%, Pentoxifylline 3%, Topiramate 1%, apply to affected area 1-2 grams 3-4 times daily (Patient taking differently: Apply 1 application topically daily as needed (PAIN). [facility]:  Peripheral Neuropathy cream - Bupivacaine 1%, Doxepin 3%, Gabapentin 6%, Pentoxifylline 3%, Topiramate 1%, apply to affected area 1-2 grams 3-4 times daily) 120 each 3  . oxyCODONE-acetaminophen (PERCOCET) 5-325 MG tablet Take 1 tablet by mouth every 6 (six) hours as needed for severe pain. 30 tablet 0  . simvastatin (ZOCOR) 40 MG tablet TAKE 1 TABLET 3 TIMES WEEKLY ON MONDAY, WEDNESDAY AND FRIDAY 39 tablet 0  . SYMBICORT 80-4.5 MCG/ACT inhaler TAKE 2 PUFFS BY MOUTH TWICE A DAY 30.6 Inhaler 1  . tamsulosin (FLOMAX) 0.4 MG CAPS capsule Take 1 capsule (0.4 mg total) by mouth daily. 90 capsule 3  . tiZANidine (ZANAFLEX) 4 MG tablet Take 4 mg by mouth 3 (three) times daily.     . VENTOLIN HFA 108 (90 Base) MCG/ACT inhaler TAKE 2 PUFFS BY MOUTH EVERY 6 HOURS AS NEEDED FOR WHEEZE OR SHORTNESS OF BREATH 18 Inhaler 2   No current facility-administered medications for this visit.     Allergies  Allergen Reactions  . Codeine Hives and Itching    Family History  Problem Relation Age of Onset  . Multiple myeloma Mother   . Diabetes Mother   . Cancer Sister        lymphoma  . Diabetes Sister   . Stroke Neg Hx     Social History   Socioeconomic History  . Marital status: Married    Spouse name: Vaughan Basta  . Number of children: 3  . Years of education: 90  . Highest education level: Not on file  Occupational History  . Occupation: retired    Comment: retired  Scientific laboratory technician  . Financial resource strain: Not on file  . Food insecurity    Worry: Not on file    Inability: Not on file  . Transportation needs     Medical: Not on file    Non-medical: Not on file  Tobacco Use  . Smoking status: Current Every Day Smoker    Packs/day: 1.00    Years: 40.00    Pack years: 40.00  . Smokeless tobacco: Never Used  Substance and Sexual Activity  . Alcohol use: No    Alcohol/week: 0.0 standard drinks  . Drug use: No  . Sexual activity: Not on file  Lifestyle  . Physical  activity    Days per week: Not on file    Minutes per session: Not on file  . Stress: Not on file  Relationships  . Social Herbalist on phone: Not on file    Gets together: Not on file    Attends religious service: Not on file    Active member of club or organization: Not on file    Attends meetings of clubs or organizations: Not on file    Relationship status: Not on file  . Intimate partner violence    Fear of current or ex partner: Not on file    Emotionally abused: Not on file    Physically abused: Not on file    Forced sexual activity: Not on file  Other Topics Concern  . Not on file  Social History Narrative   Patient is right handed   Patient resides with wife,consumes 2-3 cups of caffeine daily     Constitutional: Pt reports fatigue. Denies fever, malaise, headache or abrupt weight changes.  HEENT: Denies eye pain, eye redness, ear pain, ringing in the ears, wax buildup, runny nose, nasal congestion, bloody nose, or sore throat. Respiratory: Pt reports shortness of breath. Denies difficulty breathing, cough or sputum production.   Cardiovascular: Pt reports swelling in the legs. Denies chest pain, chest tightness, palpitations or swelling in the hands.  Gastrointestinal: Pt reports abdominal cramping and constipation. Denies bloating, diarrhea or blood in the stool.  GU: Pt reports urinary hesitancy. Denies urgency, frequency, pain with urination, burning sensation, blood in urine, odor or discharge. Musculoskeletal: Pt reports generalized weakness, difficulty with gait, and chronic back pain. Denies  decrease in range of motion, or joint  swelling.  Skin: Denies redness, rashes, lesions or ulcercations.  Neurological: Pt reports lightheadedness, difficulty with memory, problems with balance and coordination. Denies difficulty with speech Psych: Pt has a history of anxiety and depression. Denies  SI/HI.  No other specific complaints in a complete review of systems (except as listed in HPI above).  Objective:   Physical Exam   BP 122/72   Pulse 88   Temp 98.3 F (36.8 C) (Temporal)   Wt 186 lb (84.4 kg)   SpO2 94%   BMI 25.58 kg/m  Wt Readings from Last 3 Encounters:  04/27/19 186 lb (84.4 kg)  04/12/19 186 lb 8 oz (84.6 kg)  02/15/19 187 lb (84.8 kg)    General: Appears his stated age, well developed, well nourished.. Skin: Warm, dry and intact. No rashes noted. Neck:  Neck supple, trachea midline. No masses, lumps or thyromegaly present.  Cardiovascular: Normal rate and rhythm. S1,S2 noted.  No murmur, rubs or gallops noted. Trace BLE edema. No carotid bruits noted. Pulmonary/Chest: Normal effort and diminished breath sounds. No respiratory distress. No wheezes, rales or ronchi noted.  Abdomen: Soft and mildly tender in bilateral lower quadrants. Normal bowel sounds. No distention or masses noted.  Musculoskeletal: Strength 5/5 BUE/BLE. Shuffling gait. Brought a cane but is not using it. Neurological: Alert and oriented. Severe tremor in right arm. Slight tremor in left arm. BLE tremor only noted when walking.  Psychiatric: Anxious appearing today.  BMET    Component Value Date/Time   NA 137 11/11/2018 1443   NA 141 10/11/2014 1122   K 5.3 (H) 11/11/2018 1443   CL 97 11/11/2018 1443   CO2 32 11/11/2018 1443   GLUCOSE 149 (H) 11/11/2018 1443   BUN 14 11/11/2018 1443   BUN 11 10/11/2014 1122  CREATININE 1.17 11/11/2018 1443   CREATININE 0.72 03/26/2013 1608   CALCIUM 10.0 11/11/2018 1443   GFRNONAA >60 10/04/2017 2102   GFRAA >60 10/04/2017 2102    Lipid Panel      Component Value Date/Time   CHOL 169 03/20/2017 1016   TRIG 183.0 (H) 03/20/2017 1016   HDL 34.90 (L) 03/20/2017 1016   CHOLHDL 5 03/20/2017 1016   VLDL 36.6 03/20/2017 1016   LDLCALC 97 03/20/2017 1016    CBC    Component Value Date/Time   WBC 6.6 11/11/2018 1443   RBC 4.35 11/11/2018 1443   HGB 14.6 11/11/2018 1443   HCT 41.9 11/11/2018 1443   PLT 192.0 11/11/2018 1443   MCV 96.2 11/11/2018 1443   MCH 32.9 10/04/2017 2102   MCHC 34.8 11/11/2018 1443   RDW 13.1 11/11/2018 1443   RDW 13.5 10/11/2014 1122   LYMPHSABS 0.8 12/17/2016 0455   LYMPHSABS 1.3 10/11/2014 1122   MONOABS 0.3 12/17/2016 0455   EOSABS 0.0 12/17/2016 0455   EOSABS 0.0 10/11/2014 1122   BASOSABS 0.0 12/17/2016 0455   BASOSABS 0.0 10/11/2014 1122    Hgb A1C Lab Results  Component Value Date   HGBA1C 6.2 03/20/2017        Assessment & Plan:   Fatigue, Lightheadedness, Generalized Weakness, Tremor, Difficulty with Gait:  Will check CBC, CMET, TSH, Vit D and B12 Concern for Parkinsonism but could be essential tremor Orthostatics: positive Consider Fludrocortisone pending labs Encouraged him to drink plenty of fluids and make position changes slowly I would like him to go back and see his neurologist for evaluation- this could be just a decline of his dementia, but I want to make sure Parkinson's is ruled out Continue Mirtazepine for appetite stimulation Discussed using assistive devices to help with gait and balance- he prefers not to use Discussed ways to decrease falls Consider PT/OT pending labs  GAD, Depression:  Support offered today He is not interested in seeing a therapist Consider increase Citalopram to 20 mg daily Continue Lorazepam prn  Abdominal Cramping, Constipation:  Encouraged high fiber diet Start Mirilax 17 gm PO daily He is actively weaning back on narcotic use- advised him this may help the most  Shortness of Breath, COPD:  Chest xray today Encouraged  smoking cessation Continue Symbicort and Albuterol Referral to Pulmonology placed  DM 2:  Will check Lipid and A1C today

## 2019-04-27 NOTE — Patient Instructions (Signed)
Orthostatic Hypotension °Blood pressure is a measurement of how strongly, or weakly, your blood is pressing against the walls of your arteries. Orthostatic hypotension is a sudden drop in blood pressure that happens when you quickly change positions, such as when you get up from sitting or lying down. °Arteries are blood vessels that carry blood from your heart throughout your body. When blood pressure is too low, you may not get enough blood to your brain or to the rest of your organs. This can cause weakness, light-headedness, rapid heartbeat, and fainting. This can last for just a few seconds or for up to a few minutes. Orthostatic hypotension is usually not a serious problem. However, if it happens frequently or gets worse, it may be a sign of something more serious. °What are the causes? °This condition may be caused by: °· Sudden changes in posture, such as standing up quickly after you have been sitting or lying down. °· Blood loss. °· Loss of body fluids (dehydration). °· Heart problems. °· Hormone (endocrine) problems. °· Pregnancy. °· Severe infection. °· Lack of certain nutrients. °· Severe allergic reactions (anaphylaxis). °· Certain medicines, such as blood pressure medicine or medicines that make the body lose excess fluids (diuretics). Sometimes, this condition can be caused by not taking medicine as directed, such as taking too much of a certain medicine. °What increases the risk? °The following factors may make you more likely to develop this condition: °· Age. Risk increases as you get older. °· Conditions that affect the heart or the central nervous system. °· Taking certain medicines, such as blood pressure medicine or diuretics. °· Being pregnant. °What are the signs or symptoms? °Symptoms of this condition may include: °· Weakness. °· Light-headedness. °· Dizziness. °· Blurred vision. °· Fatigue. °· Rapid heartbeat. °· Fainting, in severe cases. °How is this diagnosed? °This condition is  diagnosed based on: °· Your medical history. °· Your symptoms. °· Your blood pressure measurement. Your health care provider will check your blood pressure when you are: °? Lying down. °? Sitting. °? Standing. °A blood pressure reading is recorded as two numbers, such as "120 over 80" (or 120/80). The first ("top") number is called the systolic pressure. It is a measure of the pressure in your arteries as your heart beats. The second ("bottom") number is called the diastolic pressure. It is a measure of the pressure in your arteries when your heart relaxes between beats. Blood pressure is measured in a unit called mm Hg. Healthy blood pressure for most adults is 120/80. If your blood pressure is below 90/60, you may be diagnosed with hypotension. °Other information or tests that may be used to diagnose orthostatic hypotension include: °· Your other vital signs, such as your heart rate and temperature. °· Blood tests. °· Tilt table test. For this test, you will be safely secured to a table that moves you from a lying position to an upright position. Your heart rhythm and blood pressure will be monitored during the test. °How is this treated? °This condition may be treated by: °· Changing your diet. This may involve eating more salt (sodium) or drinking more water. °· Taking medicines to raise your blood pressure. °· Changing the dosage of certain medicines you are taking that might be lowering your blood pressure. °· Wearing compression stockings. These stockings help to prevent blood clots and reduce swelling in your legs. °In some cases, you may need to go to the hospital for: °· Fluid replacement. This means you will   receive fluids through an IV. °· Blood replacement. This means you will receive donated blood through an IV (transfusion). °· Treating an infection or heart problems, if this applies. °· Monitoring. You may need to be monitored while medicines that you are taking wear off. °Follow these instructions  at home: °Eating and drinking ° °· Drink enough fluid to keep your urine pale yellow. °· Eat a healthy diet, and follow instructions from your health care provider about eating or drinking restrictions. A healthy diet includes: °? Fresh fruits and vegetables. °? Whole grains. °? Lean meats. °? Low-fat dairy products. °· Eat extra salt only as directed. Do not add extra salt to your diet unless your health care provider told you to do that. °· Eat frequent, small meals. °· Avoid standing up suddenly after eating. °Medicines °· Take over-the-counter and prescription medicines only as told by your health care provider. °? Follow instructions from your health care provider about changing the dosage of your current medicines, if this applies. °? Do not stop or adjust any of your medicines on your own. °General instructions ° °· Wear compression stockings as told by your health care provider. °· Get up slowly from lying down or sitting positions. This gives your blood pressure a chance to adjust. °· Avoid hot showers and excessive heat as directed by your health care provider. °· Return to your normal activities as told by your health care provider. Ask your health care provider what activities are safe for you. °· Do not use any products that contain nicotine or tobacco, such as cigarettes, e-cigarettes, and chewing tobacco. If you need help quitting, ask your health care provider. °· Keep all follow-up visits as told by your health care provider. This is important. °Contact a health care provider if you: °· Vomit. °· Have diarrhea. °· Have a fever for more than 2-3 days. °· Feel more thirsty than usual. °· Feel weak and tired. °Get help right away if you: °· Have chest pain. °· Have a fast or irregular heartbeat. °· Develop numbness in any part of your body. °· Cannot move your arms or your legs. °· Have trouble speaking. °· Become sweaty or feel light-headed. °· Faint. °· Feel short of breath. °· Have trouble staying  awake. °· Feel confused. °Summary °· Orthostatic hypotension is a sudden drop in blood pressure that happens when you quickly change positions. °· Orthostatic hypotension is usually not a serious problem. °· It is diagnosed by having your blood pressure taken lying down, sitting, and then standing. °· It may be treated by changing your diet or adjusting your medicines. °This information is not intended to replace advice given to you by your health care provider. Make sure you discuss any questions you have with your health care provider. °Document Released: 06/28/2002 Document Revised: 01/01/2018 Document Reviewed: 01/01/2018 °Elsevier Patient Education © 2020 [employer] ° °

## 2019-04-29 ENCOUNTER — Encounter: Payer: Self-pay | Admitting: Internal Medicine

## 2019-04-29 ENCOUNTER — Telehealth: Payer: Self-pay

## 2019-04-29 NOTE — Telephone Encounter (Signed)
Alexander Booth (DPR signed) left v/m requesting recent lab results.

## 2019-04-30 MED ORDER — CITALOPRAM HYDROBROMIDE 20 MG PO TABS: 20.0000 mg | ORAL_TABLET | Freq: Every day | ORAL | 0 refills | Status: DC

## 2019-04-30 NOTE — Addendum Note (Signed)
Addended by: Lurlean Nanny on: 04/30/2019 05:03 PM   Modules accepted: Orders

## 2019-04-30 NOTE — Telephone Encounter (Signed)
[name] returned Alexander Booth's call.  [name] can be reached at [phone].

## 2019-05-01 ENCOUNTER — Other Ambulatory Visit: Payer: Self-pay | Admitting: Internal Medicine

## 2019-05-03 NOTE — Telephone Encounter (Signed)
CMET reviewed. Slightly decreased GFR but creatinine normal. Continue meloxicam for time being.

## 2019-05-03 NOTE — Telephone Encounter (Signed)
Last labs showed slight decrease in kidney function, please advise if okay for pt to continue

## 2019-05-06 ENCOUNTER — Encounter: Payer: Self-pay | Admitting: Internal Medicine

## 2019-05-06 DIAGNOSIS — F0281 Dementia in other diseases classified elsewhere with behavioral disturbance: Secondary | ICD-10-CM

## 2019-05-06 DIAGNOSIS — G300 Alzheimer's disease with early onset: Secondary | ICD-10-CM

## 2019-05-06 DIAGNOSIS — R251 Tremor, unspecified: Secondary | ICD-10-CM

## 2019-05-06 MED ORDER — FUROSEMIDE 20 MG PO TABS: 20.0000 mg | ORAL_TABLET | Freq: Two times a day (BID) | ORAL | 3 refills | Status: DC

## 2019-05-10 ENCOUNTER — Other Ambulatory Visit: Payer: Self-pay

## 2019-05-10 ENCOUNTER — Ambulatory Visit (INDEPENDENT_AMBULATORY_CARE_PROVIDER_SITE_OTHER): Payer: Medicare HMO | Admitting: Internal Medicine

## 2019-05-10 ENCOUNTER — Encounter: Payer: Self-pay | Admitting: Internal Medicine

## 2019-05-10 DIAGNOSIS — F17210 Nicotine dependence, cigarettes, uncomplicated: Secondary | ICD-10-CM | POA: Diagnosis not present

## 2019-05-10 DIAGNOSIS — J449 Chronic obstructive pulmonary disease, unspecified: Secondary | ICD-10-CM | POA: Diagnosis not present

## 2019-05-10 MED ORDER — PANTOPRAZOLE SODIUM 40 MG PO TBEC: DELAYED_RELEASE_TABLET | ORAL | 2 refills | Status: DC

## 2019-05-10 MED ORDER — TRELEGY ELLIPTA 100-62.5-25 MCG/INH IN AEPB: 1.0000 | INHALATION_SPRAY | Freq: Every day | RESPIRATORY_TRACT | 0 refills | Status: DC

## 2019-05-10 MED ORDER — PROAIR RESPICLICK 108 (90 BASE) MCG/ACT IN AEPB: 1.0000 | INHALATION_SPRAY | RESPIRATORY_TRACT | 11 refills | Status: AC | PRN

## 2019-05-10 NOTE — Progress Notes (Signed)
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     History of Present Illness  05/10/2019  Pulmonary/ 1st office eval/Usher Hedberg  Chief Complaint  Patient presents with  . Pulmonary Consult    Referred by Webb Silversmith, NP. Pt c/o SOB for the past year. He gets SOB walking "not very far" gets SOB and dizzy upon just standing sometimes and also SOB when he lies down.   Dyspnea:  MMRC3 = can't walk 100 yards even at a slow pace at a flat grade s stopping due to sob   Cough: denies,  Though clearly  has a typical smoker's rattle - see exam  Sleep: bed is flat, 2 pillows and finds has to hyper extending neck or notes can't breath as well  SABA use: none   No obvious day to day or daytime variability or assoc excess/ purulent sputum or mucus plugs or hemoptysis or cp or chest tightness, subjective wheeze or overt sinus or hb symptoms.   Sleeping as above  without nocturnal  or early am exacerbation  of respiratory  c/o's or need for noct saba. Also denies any obvious fluctuation of symptoms with weather or environmental changes or other aggravating or alleviating factors except as outlined above   No unusual exposure hx or h/o childhood pna/ asthma or knowledge of premature birth.  Current Allergies, Complete Past Medical History, Past Surgical History, Family History, and Social History were reviewed in [facility] record.  ROS  The following are not active complaints unless bolded Hoarseness, sore throat, dysphagia, dental problems, itching, sneezing,  nasal congestion or discharge of excess mucus or purulent secretions, ear ache,   fever, chills, sweats, unintended wt loss or wt gain, classically pleuritic or exertional cp,  orthopnea pnd or arm/hand swelling  or leg  swelling, presyncope, palpitations, abdominal pain, anorexia, nausea, vomiting, diarrhea  or change in bowel habits or change in bladder habits, change in stools or change in urine, dysuria, hematuria,  rash, arthralgias, visual complaints, headache, numbness, weakness or ataxia or problems with walking due to back pain or coordination,  change in mood or  Memory. Tremor R> L resting             Past Medical History:  Diagnosis Date  . COPD (chronic obstructive pulmonary disease) ([place])   . Depression   . Erectile dysfunction    He's had a hx of   . Hypercholesterolemia   . Hypertension    Essential  . Lumbosacral disc disease    with chronic low back pain, has implanted nerve stimulator  . Peripheral neuropathy   . Presenile dementia   . Type 2 diabetes mellitus ([place]) 2010   takes oral meds    Outpatient Medications Prior to Visit  Medication Sig Dispense Refill  . Accu-Chek Softclix Lancets lancets Check sugar 2 times daily. DX E11.8 200 each 3  . aspirin 81 MG chewable tablet Chew 81 mg by mouth daily.     . Blood Glucose Monitoring Suppl (ACCU-CHEK AVIVA PLUS) w/Device KIT Check sugar 2 times daily. DX E11.8 1 kit 12  . BREO ELLIPTA 100-25 MCG/INH AEPB INHALE 1 PUFF BY MOUTH EVERY DAY 60 each 2  . citalopram (CELEXA) 20 MG tablet Take 1 tablet (20 mg  total) by mouth daily. 90 tablet 0  . divalproex (DEPAKOTE ER) 500 MG 24 hr tablet TAKE 2 TABLETS EVERY DAY 180 tablet 1  . donepezil (ARICEPT) 10 MG tablet TAKE 1 TABLET EVERY MORNING 90 tablet 0  . furosemide (LASIX) 20 MG tablet Take 1 tablet (20 mg total) by mouth 2 (two) times daily. 180 tablet 3  . gabapentin (NEURONTIN) 400 MG capsule TAKE 3 CAPSULES AT BEDTIME AND MAY REPEAT DOSE ONE TIME IF NEEDED (Patient taking differently: 2 at bedtimr) 270 capsule 1  . glucose blood (ACCU-CHEK AVIVA PLUS) test strip Check sugar 2 times daily. DX E11.8 200 each 3  . HYDROcodone-acetaminophen (NORCO) 10-325 MG tablet Take 1 tablet by  mouth every 12 (twelve) hours as needed.    Marland Kitchen ketoconazole (NIZORAL) 2 % cream Apply 1 application topically daily. To affected area 15 g 0  . LORazepam (ATIVAN) 0.5 MG tablet TAKE 1 TABLET BY MOUTH EVERY DAY AS NEEDED FOR ANXIETY 20 tablet 0  . memantine (NAMENDA XR) 7 MG CP24 24 hr capsule TAKE 1 CAPSULE (7 MG TOTAL) BY MOUTH EVERY MORNING. 90 capsule 2  . metFORMIN (GLUCOPHAGE) 1000 MG tablet Take 1 tablet (1,000 mg total) by mouth 2 (two) times daily with a meal. 180 tablet 0  . mirtazapine (REMERON) 45 MG tablet TAKE 1 TABLET AT BEDTIME 90 tablet 0  . nitroGLYCERIN (NITROSTAT) 0.4 MG SL tablet Place 0.4 mg under the tongue every 5 (five) minutes as needed for chest pain.     . NONFORMULARY OR COMPOUNDED ITEM [facility]:  Peripheral Neuropathy cream - Bupivacaine 1%, Doxepin 3%, Gabapentin 6%, Pentoxifylline 3%, Topiramate 1%, apply to affected area 1-2 grams 3-4 times daily (Patient taking differently: Apply 1 application topically daily as needed (PAIN). [facility]:  Peripheral Neuropathy cream - Bupivacaine 1%, Doxepin 3%, Gabapentin 6%, Pentoxifylline 3%, Topiramate 1%, apply to affected area 1-2 grams 3-4 times daily) 120 each 3  . simvastatin (ZOCOR) 40 MG tablet TAKE 1 TABLET 3 TIMES WEEKLY ON MONDAY, WEDNESDAY AND FRIDAY 39 tablet 0  . tamsulosin (FLOMAX) 0.4 MG CAPS capsule Take 1 capsule (0.4 mg total) by mouth daily. 90 capsule 3  . tiZANidine (ZANAFLEX) 4 MG tablet Take 4 mg by mouth 2 (two) times daily.    . meloxicam (MOBIC) 15 MG tablet TAKE 1 TABLET BY MOUTH EVERY DAY 90 tablet 3  .       . VENTOLIN HFA 108 (90 Base) MCG/ACT inhaler TAKE 2 PUFFS BY MOUTH EVERY 6 HOURS AS NEEDED FOR WHEEZE OR SHORTNESS OF BREATH 18 Inhaler 2      Objective:     BP 134/70 (BP Location: Left Arm, Cuff Size: Normal)   Pulse 60   Temp 97.7 F (36.5 C) (Temporal)   Ht '5\' 11"'  (1.803 m)   Wt 87.5 kg   SpO2 96% Comment: on RA  BMI 26.89 kg/m   SpO2: 96 %(on RA)   Elderly (>  stated age) amb wm with resting tremor R >L  And congested sounding cough on voluntary maneuver   HEENT : pt wearing mask not removed for exam due to covid - 19 concerns.    NECK :  without JVD/Nodes/TM/ nl carotid upstrokes bilaterally   LUNGS: no acc muscle use,  Mild barrel  contour chest wall with bilateral  Basilar insp/ exp rhonchi  and  without cough on insp or exp maneuvers  and mild  Hyperresonant  to  percussion bilaterally     CV:  RRR  no s3 or murmur or increase in P2, and no edema   ABD:  soft and nontender with pos end  insp Hoover's  in the supine position. No bruits or organomegaly appreciated, bowel sounds nl  MS:   Slow  gait/  ext warm without deformities, calf tenderness, cyanosis or clubbing No obvious joint restrictions   SKIN: warm and dry without lesions    NEURO:  alert, approp, nl sensorium with  no motor or cerebellar deficits apparent.           CXR PA and Lateral:   05/10/2019 :    I personally reviewed images and agree with radiology impression as follows:    No acute abnormality of the lungs.    Assessment   COPD GOLD ? / active smoker Active smoker - CT chest 10/04/17 There is a degree of underlying centrilobular emphysematous change - 05/10/2019  After extensive coaching elipta device,  effectiveness =  95%  So changed from BREO to Trelegy x 2 week sample plus prn Respiclick Proair  - 85/27/7824   Walked RA x one lap =  approx 250 ft - stopped due to  Sob/ legs gave out same time slow pace with sats 97% at end    DDX of  difficult airways management almost all start with A and  include Adherence, Ace Inhibitors, Acid Reflux, Active Sinus Disease, Alpha 1 Antitripsin deficiency, Anxiety masquerading as Airways dz,  ABPA,  Allergy(esp in young), Aspiration (esp in elderly), Adverse effects of meds,  Active smoking or vaping, A bunch of PE's (a small clot burden can't cause this syndrome unless there is already severe underlying pulm or vascular  dz with poor reserve) plus two Bs  = Bronchiectasis and Beta blocker use..and one C= CHF  Adherence is always the initial "prime suspect" and is a multilayered concern that requires a "trust but verify" approach in every patient - starting with knowing how to use medications, especially inhalers, correctly, keeping up with refills and understanding the fundamental difference between maintenance and prns vs those medications only taken for a very short course and then stopped and not refilled.  - see device teaching - return with all meds in hand using a trust but verify approach to confirm accurate Medication  Reconciliation The principal here is that until we are certain that the  patients are doing what we've asked, it makes no sense to ask them to do more.   Active smoking  (see separate a/p)   ? Acid (or non-acid) GERD > always difficult to exclude as up to 75% of pts in some series report no assoc GI/ Heartburn symptoms and noct spells could be gerd related > rec max (24h)  acid suppression and diet restrictions/ reviewed and instructions given in writing.   ? Alpha one def > does not fit here but worth checking to complete the w/u   ? Anxiety/depression/ deconditioning  > usually at the bottom of this list of usual suspects but should be much higher on this pt's based on H and P and note already on psychotropics and may interfere with ability to quit smoking and  adherence and also interpretation of response or lack thereof to symptom management which can be quite subjective.   ? Adverse effects of meds > none of the usual suspects listed.  ? Anemia/ thryoid dz   Lab Results  Component Value Date   HGB 14.1 04/27/2019   HGB 14.6 11/11/2018   HGB 12.9 (  L) 01/29/2018   Lab Results  Component Value Date   TSH 1.96 04/27/2019    ? chf > bnp nl last check  11/11/18 while symptomatic excludes for all intents/ purposes   >>>  F/u in 2 weeks to regroup/ alpha one AT screen and set up for  pfts     Cigarette smoker Counseled re importance of smoking cessation but did not meet time criteria for separate billing       Total time devoted to counseling  > 50 % of initial 60 min office visit:  reviewed case with pt/daughter Theresa/  directly observed portions of ambulatory 02 saturation study/  performed device teaching  using a teach back technique which also  extended face to face time for this visit (see above)  discussion of options/alternatives/ personally creating written customized instructions  in presence of pt  then going over those specific  Instructions directly with the pt including how to use all of the meds but in particular covering each new medication in detail and the difference between the maintenance= "automatic" meds and the prns using an action plan format for the latter (If this problem/symptom => do that organization reading Left to right).  Please see AVS from this visit for a full list of these instructions which I personally wrote for this pt and  are unique to this visit.        Christinia Gully, MD 05/10/2019

## 2019-05-10 NOTE — Patient Instructions (Addendum)
The key is to stop smoking completely before smoking completely stops you!  Plan A = Automatic = Always=    Trelegy one click each AM and then rinse/ gargle   Protonix 40 mg Take 30- 60 min before your first and last meals of the day    As needed for breathing Plan B = Backup (to supplement plan A, not to replace it) Only use your albuterol inhaler(PROAIR RESPICLICK)  as a rescue medication to be used if you can't catch your breath by resting or doing a relaxed purse lip breathing pattern.  - The less you use it, the better it will work when you need it. - Ok to use the inhaler up to 2 puffs  every 4 hours if you must but call for appointment if use goes up over your usual need - Don't leave home without it !!  (think of it like the spare tire for your car)    GERD (REFLUX)  is an extremely common cause of respiratory symptoms just like yours , many times with no obvious heartburn at all.    It can be treated with medication, but also with lifestyle changes including elevation of the head of your bed (ideally with 6 -8inch blocks under the headboard of your bed),  Smoking cessation, avoidance of late meals, excessive alcohol, and avoid fatty foods, chocolate, peppermint, colas, red wine, and acidic juices such as orange juice.  NO MINT OR MENTHOL PRODUCTS SO NO COUGH DROPS  USE SUGARLESS CANDY INSTEAD (Jolley ranchers or Stover's or Life Savers) or even ice chips will also do - the key is to swallow to prevent all throat clearing. NO OIL BASED VITAMINS - use powdered substitutes.  Avoid fish oil when coughing.   Please schedule a follow up office visit in 2  weeks, sooner if needed  with all medications /inhalers/ solutions in hand so we can verify exactly what you are taking. This includes all medications from all doctors and over the counters  add:  alpha one AT screen and set up for pfts if not improved

## 2019-05-10 NOTE — Assessment & Plan Note (Addendum)
Counseled re importance of smoking cessation but did not meet time criteria for separate billing     Total time devoted to counseling  > 50 % of initial 60 min office visit:  reviewed case with pt/daughter Theresa/  directly observed portions of ambulatory 02 saturation study/  performed device teaching  using a teach back technique which also  extended face to face time for this visit (see above)  discussion of options/alternatives/ personally creating written customized instructions  in presence of pt  then going over those specific  Instructions directly with the pt including how to use all of the meds but in particular covering each new medication in detail and the difference between the maintenance= "automatic" meds and the prns using an action plan format for the latter (If this problem/symptom => do that organization reading Left to right).  Please see AVS from this visit for a full list of these instructions which I personally wrote for this pt and  are unique to this visit.

## 2019-05-10 NOTE — Assessment & Plan Note (Addendum)
Active smoker - CT chest 10/04/17 There is a degree of underlying centrilobular emphysematous change - 05/10/2019  After extensive coaching elipta device,  effectiveness =  95%  So changed from BREO to Trelegy x 2 week sample plus prn Respiclick Proair  - [id number]   Walked RA x one lap =  approx 250 ft - stopped due to  Sob/ legs gave out same time slow pace with sats 97% at end    DDX of  difficult airways management almost all start with A and  include Adherence, Ace Inhibitors, Acid Reflux, Active Sinus Disease, Alpha 1 Antitripsin deficiency, Anxiety masquerading as Airways dz,  ABPA,  Allergy(esp in young), Aspiration (esp in elderly), Adverse effects of meds,  Active smoking or vaping, A bunch of PE's (a small clot burden can't cause this syndrome unless there is already severe underlying pulm or vascular dz with poor reserve) plus two Bs  = Bronchiectasis and Beta blocker use..and one C= CHF  Adherence is always the initial "prime suspect" and is a multilayered concern that requires a "trust but verify" approach in every patient - starting with knowing how to use medications, especially inhalers, correctly, keeping up with refills and understanding the fundamental difference between maintenance and prns vs those medications only taken for a very short course and then stopped and not refilled.  - see device teaching - return with all meds in hand using a trust but verify approach to confirm accurate Medication  Reconciliation The principal here is that until we are certain that the  patients are doing what we've asked, it makes no sense to ask them to do more.   Active smoking  (see separate a/p)   ? Acid (or non-acid) GERD > always difficult to exclude as up to 75% of pts in some series report no assoc GI/ Heartburn symptoms and noct spells could be gerd related > rec max (24h)  acid suppression and diet restrictions/ reviewed and instructions given in writing.   ? Alpha one def > does not  fit here but worth checking to complete the w/u   ? Anxiety/depression/ deconditioning  > usually at the bottom of this list of usual suspects but should be much higher on this pt's based on H and P and note already on psychotropics and may interfere with ability to quit smoking and  adherence and also interpretation of response or lack thereof to symptom management which can be quite subjective.   ? Adverse effects of meds > none of the usual suspects listed.  ? Anemia/ thryoid dz   Lab Results  Component Value Date   HGB 14.1 04/27/2019   HGB 14.6 11/11/2018   HGB 12.9 (L) 01/29/2018   Lab Results  Component Value Date   TSH 1.96 04/27/2019    ? chf > bnp nl last check  11/11/18 while symptomatic excludes for all intents/ purposes    >>>  F/u in 2 weeks to regroup/ alpha one AT screen and set up for pfts

## 2019-05-13 ENCOUNTER — Other Ambulatory Visit: Payer: Self-pay | Admitting: Internal Medicine

## 2019-05-25 ENCOUNTER — Ambulatory Visit: Payer: Medicare HMO | Admitting: Internal Medicine

## 2019-05-25 ENCOUNTER — Telehealth: Payer: Self-pay

## 2019-05-25 ENCOUNTER — Other Ambulatory Visit: Payer: Self-pay | Admitting: Internal Medicine

## 2019-05-25 NOTE — Telephone Encounter (Signed)
[place] Night - Client Nonclinical Telephone Record AccessNurse Client [facility] Night - Client Client Site [place] Physician Webb Silversmith - NP Contact Type Call Who Is Calling Physician / Provider / Hospital Call Type Provider Call Message Only Reason for Call Request to send message to Office Initial Comment Caller is Autumn from [place] trying to reach the office about a patient. Needs prescription for 400 mg Gabapentin capsules. Phone number: [phone] Patient: Alexander Booth, July 18, 1946 Additional Comment Call Closed By: Marinus Maw Transaction Date/Time: 05/24/2019 5:38:19 PM (ET)

## 2019-05-26 ENCOUNTER — Ambulatory Visit: Payer: Medicare HMO | Admitting: Podiatry

## 2019-05-26 ENCOUNTER — Other Ambulatory Visit: Payer: Self-pay | Admitting: Internal Medicine

## 2019-05-26 DIAGNOSIS — F03918 Unspecified dementia, unspecified severity, with other behavioral disturbance: Secondary | ICD-10-CM

## 2019-05-26 DIAGNOSIS — G4700 Insomnia, unspecified: Secondary | ICD-10-CM

## 2019-05-26 DIAGNOSIS — F0391 Unspecified dementia with behavioral disturbance: Secondary | ICD-10-CM

## 2019-05-26 NOTE — Telephone Encounter (Signed)
Last filled 04/05/2019... please advise

## 2019-05-26 NOTE — Telephone Encounter (Signed)
yes

## 2019-05-28 MED ORDER — GABAPENTIN 400 MG PO CAPS: ORAL_CAPSULE | ORAL | 1 refills | Status: DC

## 2019-05-28 NOTE — Addendum Note (Signed)
Addended by: Lurlean Nanny on: 05/28/2019 04:44 PM   Modules accepted: Orders

## 2019-06-01 ENCOUNTER — Other Ambulatory Visit: Payer: Self-pay

## 2019-06-01 ENCOUNTER — Encounter: Payer: Self-pay | Admitting: Internal Medicine

## 2019-06-01 ENCOUNTER — Ambulatory Visit (INDEPENDENT_AMBULATORY_CARE_PROVIDER_SITE_OTHER): Payer: Medicare HMO | Admitting: Internal Medicine

## 2019-06-01 VITALS — BP 130/82 | HR 76 | Temp 97.9°F | Ht 72.0 in | Wt 186.0 lb

## 2019-06-01 DIAGNOSIS — E785 Hyperlipidemia, unspecified: Secondary | ICD-10-CM

## 2019-06-01 DIAGNOSIS — G63 Polyneuropathy in diseases classified elsewhere: Secondary | ICD-10-CM | POA: Diagnosis not present

## 2019-06-01 DIAGNOSIS — Z0000 Encounter for general adult medical examination without abnormal findings: Secondary | ICD-10-CM

## 2019-06-01 DIAGNOSIS — I10 Essential (primary) hypertension: Secondary | ICD-10-CM

## 2019-06-01 DIAGNOSIS — F339 Major depressive disorder, recurrent, unspecified: Secondary | ICD-10-CM | POA: Diagnosis not present

## 2019-06-01 DIAGNOSIS — M549 Dorsalgia, unspecified: Secondary | ICD-10-CM

## 2019-06-01 DIAGNOSIS — E118 Type 2 diabetes mellitus with unspecified complications: Secondary | ICD-10-CM | POA: Diagnosis not present

## 2019-06-01 DIAGNOSIS — Z125 Encounter for screening for malignant neoplasm of prostate: Secondary | ICD-10-CM

## 2019-06-01 DIAGNOSIS — G8929 Other chronic pain: Secondary | ICD-10-CM

## 2019-06-01 DIAGNOSIS — J449 Chronic obstructive pulmonary disease, unspecified: Secondary | ICD-10-CM

## 2019-06-01 DIAGNOSIS — K219 Gastro-esophageal reflux disease without esophagitis: Secondary | ICD-10-CM

## 2019-06-01 DIAGNOSIS — F0391 Unspecified dementia with behavioral disturbance: Secondary | ICD-10-CM | POA: Diagnosis not present

## 2019-06-01 DIAGNOSIS — G479 Sleep disorder, unspecified: Secondary | ICD-10-CM | POA: Diagnosis not present

## 2019-06-01 DIAGNOSIS — F0281 Dementia in other diseases classified elsewhere with behavioral disturbance: Secondary | ICD-10-CM

## 2019-06-01 DIAGNOSIS — G300 Alzheimer's disease with early onset: Secondary | ICD-10-CM | POA: Diagnosis not present

## 2019-06-01 DIAGNOSIS — E1142 Type 2 diabetes mellitus with diabetic polyneuropathy: Secondary | ICD-10-CM | POA: Diagnosis not present

## 2019-06-01 DIAGNOSIS — F02818 Dementia in other diseases classified elsewhere, unspecified severity, with other behavioral disturbance: Secondary | ICD-10-CM

## 2019-06-01 LAB — PSA, MEDICARE: PSA: 0.56 ng/ml (ref 0.10–4.00)

## 2019-06-01 MED ORDER — LORAZEPAM 0.5 MG PO TABS: 0.5000 mg | ORAL_TABLET | Freq: Every day | ORAL | 0 refills | Status: DC | PRN

## 2019-06-01 NOTE — Patient Instructions (Signed)
Health Maintenance After Age 75 After age 75, you are at a higher risk for certain long-term diseases and infections as well as injuries from falls. Falls are a major cause of broken bones and head injuries in people who are older than age 75. Getting regular preventive care can help to keep you healthy and well. Preventive care includes getting regular testing and making lifestyle changes as recommended by your health care provider. Talk with your health care provider about:  Which screenings and tests you should have. A screening is a test that checks for a disease when you have no symptoms.  A diet and exercise plan that is right for you. What should I know about screenings and tests to prevent falls? Screening and testing are the best ways to find a health problem early. Early diagnosis and treatment give you the best chance of managing medical conditions that are common after age 75. Certain conditions and lifestyle choices may make you more likely to have a fall. Your health care provider may recommend:  Regular vision checks. Poor vision and conditions such as cataracts can make you more likely to have a fall. If you wear glasses, make sure to get your prescription updated if your vision changes.  Medicine review. Work with your health care provider to regularly review all of the medicines you are taking, including over-the-counter medicines. Ask your health care provider about any side effects that may make you more likely to have a fall. Tell your health care provider if any medicines that you take make you feel dizzy or sleepy.  Osteoporosis screening. Osteoporosis is a condition that causes the bones to get weaker. This can make the bones weak and cause them to break more easily.  Blood pressure screening. Blood pressure changes and medicines to control blood pressure can make you feel dizzy.  Strength and balance checks. Your health care provider may recommend certain tests to check your  strength and balance while standing, walking, or changing positions.  Foot health exam. Foot pain and numbness, as well as not wearing proper footwear, can make you more likely to have a fall.  Depression screening. You may be more likely to have a fall if you have a fear of falling, feel emotionally low, or feel unable to do activities that you used to do.  Alcohol use screening. Using too much alcohol can affect your balance and may make you more likely to have a fall. What actions can I take to lower my risk of falls? General instructions  Talk with your health care provider about your risks for falling. Tell your health care provider if: ? You fall. Be sure to tell your health care provider about all falls, even ones that seem minor. ? You feel dizzy, sleepy, or off-balance.  Take over-the-counter and prescription medicines only as told by your health care provider. These include any supplements.  Eat a healthy diet and maintain a healthy weight. A healthy diet includes low-fat dairy products, low-fat (lean) meats, and fiber from whole grains, beans, and lots of fruits and vegetables. Home safety  Remove any tripping hazards, such as rugs, cords, and clutter.  Install safety equipment such as grab bars in bathrooms and safety rails on stairs.  Keep rooms and walkways well-lit. Activity   Follow a regular exercise program to stay fit. This will help you maintain your balance. Ask your health care provider what types of exercise are appropriate for you.  If you need a cane or   walker, use it as recommended by your health care provider.  Wear supportive shoes that have nonskid soles. Lifestyle  Do not drink alcohol if your health care provider tells you not to drink.  If you drink alcohol, limit how much you have: ? 0-1 drink a day for women. ? 0-2 drinks a day for men.  Be aware of how much alcohol is in your drink. In the [place] one drink equals one typical bottle of beer (12  oz), one-half glass of wine (5 oz), or one shot of hard liquor (1 oz).  Do not use any products that contain nicotine or tobacco, such as cigarettes and e-cigarettes. If you need help quitting, ask your health care provider. Summary  Having a healthy lifestyle and getting preventive care can help to protect your health and wellness after age 75.  Screening and testing are the best way to find a health problem early and help you avoid having a fall. Early diagnosis and treatment give you the best chance for managing medical conditions that are more common for people who are older than age 75.  Falls are a major cause of broken bones and head injuries in people who are older than age 75. Take precautions to prevent a fall at home.  Work with your health care provider to learn what changes you can make to improve your health and wellness and to prevent falls. This information is not intended to replace advice given to you by your health care provider. Make sure you discuss any questions you have with your health care provider. Document Released: 05/21/2017 Document Revised: 10/29/2018 Document Reviewed: 05/21/2017 Elsevier Patient Education  2020 [employer]  

## 2019-06-01 NOTE — Assessment & Plan Note (Signed)
Stable with Mirtazapine, wean not indicated

## 2019-06-01 NOTE — Progress Notes (Signed)
HPI:  Pt presents to the clinic today for his subsequent annual Medicare Wellness Exam. He is also due to follow up chronic conditions.  COPD: He has a chronic cough and shortness of breath. He saw Dr. Melvyn Novas 04/2019 for an initial evaluation. He was supposed to go back in 2 weeks for PFT's but cancelled his follow up appt. He is currently taking Breo and Albuterol PRN.  Depression: Chronic but stable on Citalopram. He takes Lorazepam as needed for agitation. He takes Mirtazapine at night for sleep. He is not currently seeing a therapist. He denies SI/HI.  HLD: His last LDL was 96, 04/2019. He denies myalgias on Simvastatin. He consumes a low fat diet.  HTN: His BP today is 130/82. He is taking HCTZ as prescribed. ECG from 09/2017 reviewed.  Chronic Low Back Pain with Neuropathy: He follows with Dr. [name]. He does have a implanted nerve pain stimulator. He takes Meloxicam, Zanaflex, Hydrocodone and Gabapentin as prescribed.   Dementia: Persistent memory issues with agitation at times. He is taking Aricept, Namenda and Depakote. He takes Lorazepam as needed for agitation. He was referred back to neurology but his daughter reports he refused to go.  DM 2: His last A1C was 7.7%, 04/2019. He does not check his sugars. He takes Metformin as prescribed. He does not check his feet regularly. He gets an eye exam yearly.   GERD: He is not sure what triggers this. He was started on Pantoprazole by Dr. Melvyn Novas but they never picked up this prescription. He takes Famotidine nightly with some relief. There is no upper GI on file.  Past Medical History:  Diagnosis Date  . COPD (chronic obstructive pulmonary disease) ([place])   . Depression   . Erectile dysfunction    He's had a hx of   . Hypercholesterolemia   . Hypertension    Essential  . Lumbosacral disc disease    with chronic low back pain, has implanted nerve stimulator  . Peripheral neuropathy   . Presenile dementia   . Type 2 diabetes mellitus  ([place]) 2010   takes oral meds    Current Outpatient Medications  Medication Sig Dispense Refill  . Accu-Chek Softclix Lancets lancets Check sugar 2 times daily. DX E11.8 200 each 3  . Albuterol Sulfate (PROAIR RESPICLICK) 335 (90 Base) MCG/ACT AEPB Inhale 1-2 puffs into the lungs every 4 (four) hours as needed. 1 each 11  . aspirin 81 MG chewable tablet Chew 81 mg by mouth daily.     . Blood Glucose Monitoring Suppl (ACCU-CHEK AVIVA PLUS) w/Device KIT Check sugar 2 times daily. DX E11.8 1 kit 12  . citalopram (CELEXA) 20 MG tablet Take 1 tablet (20 mg total) by mouth daily. 90 tablet 0  . divalproex (DEPAKOTE ER) 500 MG 24 hr tablet TAKE 2 TABLETS EVERY DAY 180 tablet 1  . donepezil (ARICEPT) 10 MG tablet TAKE 1 TABLET EVERY MORNING 90 tablet 0  . Fluticasone-Umeclidin-Vilant (TRELEGY ELLIPTA) 100-62.5-25 MCG/INH AEPB Inhale 1 puff into the lungs daily. 14 each 0  . furosemide (LASIX) 20 MG tablet Take 1 tablet (20 mg total) by mouth 2 (two) times daily. 180 tablet 3  . gabapentin (NEURONTIN) 400 MG capsule TAKE 3 CAPSULES AT BEDTIME AND MAY REPEAT DOSE ONE TIME IF NEEDED 270 capsule 1  . glucose blood (ACCU-CHEK AVIVA PLUS) test strip Check sugar 2 times daily. DX E11.8 200 each 3  . HYDROcodone-acetaminophen (NORCO) 10-325 MG tablet Take 1 tablet by mouth every 12 (twelve) hours  as needed.    Marland Kitchen ketoconazole (NIZORAL) 2 % cream Apply 1 application topically daily. To affected area 15 g 0  . LORazepam (ATIVAN) 0.5 MG tablet TAKE 1 TABLET BY MOUTH EVERY DAY AS NEEDED FOR ANXIETY 20 tablet 0  . memantine (NAMENDA XR) 7 MG CP24 24 hr capsule TAKE 1 CAPSULE (7 MG TOTAL) BY MOUTH EVERY MORNING. 90 capsule 0  . metFORMIN (GLUCOPHAGE) 1000 MG tablet Take 1 tablet (1,000 mg total) by mouth 2 (two) times daily with a meal. 180 tablet 0  . mirtazapine (REMERON) 45 MG tablet TAKE 1 TABLET AT BEDTIME 90 tablet 0  . nitroGLYCERIN (NITROSTAT) 0.4 MG SL tablet Place 0.4 mg under the tongue every 5 (five)  minutes as needed for chest pain.     . NONFORMULARY OR COMPOUNDED ITEM [facility]:  Peripheral Neuropathy cream - Bupivacaine 1%, Doxepin 3%, Gabapentin 6%, Pentoxifylline 3%, Topiramate 1%, apply to affected area 1-2 grams 3-4 times daily (Patient taking differently: Apply 1 application topically daily as needed (PAIN). [facility]:  Peripheral Neuropathy cream - Bupivacaine 1%, Doxepin 3%, Gabapentin 6%, Pentoxifylline 3%, Topiramate 1%, apply to affected area 1-2 grams 3-4 times daily) 120 each 3  . pantoprazole (PROTONIX) 40 MG tablet Take 30- 60 min before your first and last meals of the day 30 tablet 2  . simvastatin (ZOCOR) 40 MG tablet TAKE 1 TABLET 3 TIMES WEEKLY ON MONDAY, WEDNESDAY AND FRIDAY 39 tablet 0  . tamsulosin (FLOMAX) 0.4 MG CAPS capsule Take 1 capsule (0.4 mg total) by mouth daily. 90 capsule 3  . tiZANidine (ZANAFLEX) 4 MG tablet Take 4 mg by mouth 2 (two) times daily.     No current facility-administered medications for this visit.     Allergies  Allergen Reactions  . Codeine Hives and Itching    Family History  Problem Relation Age of Onset  . Multiple myeloma Mother   . Diabetes Mother   . Cancer Sister        lymphoma  . Diabetes Sister   . Stroke Neg Hx     Social History   Socioeconomic History  . Marital status: Married    Spouse name: Vaughan Basta  . Number of children: 3  . Years of education: 47  . Highest education level: Not on file  Occupational History  . Occupation: retired    Comment: retired  Scientific laboratory technician  . Financial resource strain: Not on file  . Food insecurity    Worry: Not on file    Inability: Not on file  . Transportation needs    Medical: Not on file    Non-medical: Not on file  Tobacco Use  . Smoking status: Current Every Day Smoker    Packs/day: 1.00    Years: 40.00    Pack years: 40.00  . Smokeless tobacco: Never Used  Substance and Sexual Activity  . Alcohol use: No    Alcohol/week: 0.0 standard drinks  .  Drug use: No  . Sexual activity: Not on file  Lifestyle  . Physical activity    Days per week: Not on file    Minutes per session: Not on file  . Stress: Not on file  Relationships  . Social Herbalist on phone: Not on file    Gets together: Not on file    Attends religious service: Not on file    Active member of club or organization: Not on file    Attends meetings of clubs or  organizations: Not on file    Relationship status: Not on file  . Intimate partner violence    Fear of current or ex partner: Not on file    Emotionally abused: Not on file    Physically abused: Not on file    Forced sexual activity: Not on file  Other Topics Concern  . Not on file  Social History Narrative   Patient is right handed   Patient resides with wife,consumes 2-3 cups of caffeine daily    Hospitiliaztions: None  Health Maintenance:    Flu: 03/2019  Tetanus: 08/2012  Pneumovax: 09/2015  Prevnar: 04/2018  Zostavax: 09/2012  PSA: 02/2017  Colon Screening: 02/2015  Eye Doctor: annually  Dental Exam: as needed   Providers:   PCP: Webb Silversmith, NP  Podiatry: Dr.  Amalia Hailey  Pain Speciliast: Dr. [name]  Pulmonologist: Dr. Melvyn Novas   I have personally reviewed and have noted:  1. The patient's medical and social history 2. Their use of alcohol, tobacco or illicit drugs 3. Their current medications and supplements 4. The patient's functional ability including ADL's, fall risks, home safety risks and hearing or visual impairment. 5. Diet and physical activities 6. Evidence for depression or mood disorder  Subjective:   Review of Systems:   Constitutional: Pt reports fatigue, weight loss. Denies fever, malaise, headache.  HEENT: Pt reports difficulty with hearing, wax buildup. Denies eye pain, eye redness, ear pain, ringing in the ears, wax buildup, runny nose, nasal congestion, bloody nose, or sore throat. Respiratory: Pt reports chronic cough and shortness of breath. Denies  difficulty breathing, or sputum production.   Cardiovascular: Pt reports swelling in legs. Denies chest pain, chest tightness, palpitations or swelling in the hands.  Gastrointestinal: Pt reports intermittent reflux. Denies abdominal pain, bloating, constipation, diarrhea or blood in the stool.  GU: Pt reports urinary hesitancy. Denies urgency, frequency, pain with urination, burning sensation, blood in urine, odor or discharge. Musculoskeletal: Pt reports generalized weakness, chronic back pain. Denies decrease in range of motion, difficulty with gait, or joint swelling.  Skin: Denies redness, rashes, lesions or ulcercations.  Neurological: Pt reports difficulty with memory, resting tremor. Denies dizziness, difficulty with speech or problems with balance and coordination.  Psych: Pt has a history of anxiety and depression. Denies SI/HI.  No other specific complaints in a complete review of systems (except as listed in HPI above).  Objective:  PE:   BP 130/82   Pulse 76   Temp 97.9 F (36.6 C) (Temporal)   Ht 6' (1.829 m)   Wt 186 lb (84.4 kg)   SpO2 93%   BMI 25.23 kg/m   Wt Readings from Last 3 Encounters:  05/10/19 192 lb 12.8 oz (87.5 kg)  04/27/19 186 lb (84.4 kg)  04/12/19 186 lb 8 oz (84.6 kg)    General: Appears his stated age, chronically ill appearing in NAD. Skin: Warm, dry and intact. Bruising to bilateral upper extremities. No ulcerations noted. HEENT: Head: normal shape and size; Eyes: sclera white, no icterus, conjunctiva pink, PERRLA and EOMs intact; Ears: bilateral cerumen impaction;  Neck: Neck supple, trachea midline. No masses, lumps or thyromegaly present.  Cardiovascular: Normal rate and rhythm. S1,S2 noted.  No murmur, rubs or gallops noted. Trace pitting BLE edema. No carotid bruits noted. Pulmonary/Chest: Normal effort and positive vesicular breath sounds. No respiratory distress. No wheezes, rales or ronchi noted.  Abdomen: Soft and nontender. Normal  bowel sounds. No distention or masses noted. Liver, spleen and kidneys non palpable. Musculoskeletal:  Strength 5/5 BUE. Strength 4/5 BLE. Shuffling gait, but steady without device.  Neurological: Alert and oriented to person and place. Psychiatric: Anxious appearing. Behavior is normal. Judgment and thought content normal.     BMET    Component Value Date/Time   NA 141 04/27/2019 1116   NA 141 10/11/2014 1122   K 4.5 04/27/2019 1116   CL 101 04/27/2019 1116   CO2 31 04/27/2019 1116   GLUCOSE 137 (H) 04/27/2019 1116   BUN 20 04/27/2019 1116   BUN 11 10/11/2014 1122   CREATININE 1.21 04/27/2019 1116   CREATININE 0.72 03/26/2013 1608   CALCIUM 9.7 04/27/2019 1116   GFRNONAA >60 10/04/2017 2102   GFRAA >60 10/04/2017 2102    Lipid Panel     Component Value Date/Time   CHOL 201 (H) 04/27/2019 1116   TRIG 378.0 (H) 04/27/2019 1116   HDL 33.50 (L) 04/27/2019 1116   CHOLHDL 6 04/27/2019 1116   VLDL 75.6 (H) 04/27/2019 1116   LDLCALC 97 03/20/2017 1016    CBC    Component Value Date/Time   WBC 7.7 04/27/2019 1116   RBC 4.23 04/27/2019 1116   HGB 14.1 04/27/2019 1116   HCT 41.5 04/27/2019 1116   PLT 185.0 04/27/2019 1116   MCV 98.0 04/27/2019 1116   MCH 32.9 10/04/2017 2102   MCHC 33.9 04/27/2019 1116   RDW 13.3 04/27/2019 1116   RDW 13.5 10/11/2014 1122   LYMPHSABS 0.8 12/17/2016 0455   LYMPHSABS 1.3 10/11/2014 1122   MONOABS 0.3 12/17/2016 0455   EOSABS 0.0 12/17/2016 0455   EOSABS 0.0 10/11/2014 1122   BASOSABS 0.0 12/17/2016 0455   BASOSABS 0.0 10/11/2014 1122    Hgb A1C Lab Results  Component Value Date   HGBA1C 7.7 (H) 04/27/2019      Assessment and Plan:   Medicare Annual Wellness Visit:  Diet: He does eat meat. He consumes fruits and veggies daily. He tries to avoid fried foods. He drinks mostly water, beer. Physical activity: Sedentary Depression/mood screen: Chronic, on meds, PHQ 9 score of 7 Hearing: Intact to whispered voice Visual acuity:  Grossly normal, performs annual eye exam  ADLs: Capable Fall risk: High Home safety: Good Cognitive evaluation: Intact to orientation, naming. Trouble with recall and repetition. EOL planning: Adv directives, DNR I agree  Preventative Medicine: Flu, tetanus, pneumovax, prevnar and zostovax UTD.He declines shingrix. Colon screening UTD. Encouraged him to consume a balanced diet and exercise regimen. Advised him to see an eye doctor annually, dentist as needed. Will check PSA today, other labs from 04/2019 reviewed. Due dates for screening exam given to patient as part of his AVS.   Next appointment: 6 months, follow up chronic conditions.   Webb Silversmith, NP

## 2019-06-01 NOTE — Assessment & Plan Note (Signed)
Continue Breo and Albuterol He has elected not to see pulmonology again, will respect his wishes Encouraged smoking cessation, he declines

## 2019-06-01 NOTE — Assessment & Plan Note (Addendum)
Continue Meloxciam, Zanaflex, Hydrocodone and Gabapentin Encouraged regular physical activity Continue to follow with pain management

## 2019-06-01 NOTE — Assessment & Plan Note (Signed)
Refused to start Pantoprazole prescribed by pulmonology Increase Famotidine to BID

## 2019-06-01 NOTE — Assessment & Plan Note (Addendum)
Controlled with HCTZ CMET reviewed Will monitor

## 2019-06-01 NOTE — Assessment & Plan Note (Signed)
Chronic, stable on Citalopram, Mortazapine and Lorazepam Wean not indicated Support offered today

## 2019-06-01 NOTE — Assessment & Plan Note (Signed)
CMET and lipid profile reviewed Continue Simvastatin at this time Encouraged him to consume a low fat diet

## 2019-06-01 NOTE — Assessment & Plan Note (Signed)
Recent A1C reviewed Continue Metformin Encouraged low carb diet Foot exam today Encouraged yearly eye exam Flu and pneumonia vaccines UTD

## 2019-06-01 NOTE — Assessment & Plan Note (Signed)
Controlled on Gabapentin

## 2019-06-01 NOTE — Assessment & Plan Note (Signed)
Stable cognitive and functional status Continue Aricept, Namenda, Depakote and Lorazepam He refuses to follow with neurology or neuropsych at this time

## 2019-06-14 ENCOUNTER — Telehealth: Payer: Self-pay | Admitting: Podiatry

## 2019-06-14 NOTE — Telephone Encounter (Signed)
pts daughter left message checking on status of diabetic shoes.  Returned call and they are on back order and are expected to ship out next week as of now. I will call and to schedule an appt when they come in.

## 2019-06-15 DIAGNOSIS — M4807 Spinal stenosis, lumbosacral region: Secondary | ICD-10-CM | POA: Diagnosis not present

## 2019-06-15 DIAGNOSIS — M5416 Radiculopathy, lumbar region: Secondary | ICD-10-CM | POA: Diagnosis not present

## 2019-06-19 ENCOUNTER — Other Ambulatory Visit: Payer: Self-pay | Admitting: Internal Medicine

## 2019-06-23 ENCOUNTER — Other Ambulatory Visit: Payer: Self-pay | Admitting: Internal Medicine

## 2019-06-24 ENCOUNTER — Encounter: Payer: Self-pay | Admitting: Internal Medicine

## 2019-06-24 NOTE — Telephone Encounter (Signed)
Last filled 06/01/2019.Marland KitchenMarland KitchenMarland Kitchen please advise

## 2019-06-29 ENCOUNTER — Other Ambulatory Visit: Payer: Self-pay | Admitting: Internal Medicine

## 2019-07-02 ENCOUNTER — Other Ambulatory Visit: Payer: Self-pay

## 2019-07-02 ENCOUNTER — Ambulatory Visit: Payer: Medicare HMO | Admitting: Orthotics

## 2019-07-02 DIAGNOSIS — M21622 Bunionette of left foot: Secondary | ICD-10-CM

## 2019-07-02 DIAGNOSIS — E0842 Diabetes mellitus due to underlying condition with diabetic polyneuropathy: Secondary | ICD-10-CM

## 2019-07-02 DIAGNOSIS — M2041 Other hammer toe(s) (acquired), right foot: Secondary | ICD-10-CM

## 2019-07-19 ENCOUNTER — Encounter: Payer: Self-pay | Admitting: Internal Medicine

## 2019-07-19 ENCOUNTER — Other Ambulatory Visit: Payer: Self-pay | Admitting: Internal Medicine

## 2019-07-20 MED ORDER — LORAZEPAM 0.5 MG PO TABS: 0.5000 mg | ORAL_TABLET | Freq: Two times a day (BID) | ORAL | 0 refills | Status: DC | PRN

## 2019-07-20 NOTE — Addendum Note (Signed)
Addended by: [name] on: 07/20/2019 11:51 AM   Modules accepted: Orders

## 2019-08-03 ENCOUNTER — Ambulatory Visit: Payer: Medicare HMO | Admitting: Internal Medicine

## 2019-08-07 ENCOUNTER — Other Ambulatory Visit: Payer: Self-pay | Admitting: Internal Medicine

## 2019-08-07 DIAGNOSIS — F0391 Unspecified dementia with behavioral disturbance: Secondary | ICD-10-CM

## 2019-08-07 DIAGNOSIS — G4700 Insomnia, unspecified: Secondary | ICD-10-CM

## 2019-08-07 DIAGNOSIS — F03918 Unspecified dementia, unspecified severity, with other behavioral disturbance: Secondary | ICD-10-CM

## 2019-08-18 ENCOUNTER — Other Ambulatory Visit: Payer: Self-pay | Admitting: Internal Medicine

## 2019-08-18 MED ORDER — SIMVASTATIN 40 MG PO TABS: 40.0000 mg | ORAL_TABLET | Freq: Every day | ORAL | 0 refills | Status: DC

## 2019-08-27 DIAGNOSIS — Z86010 Personal history of colonic polyps: Secondary | ICD-10-CM | POA: Diagnosis not present

## 2019-09-01 ENCOUNTER — Other Ambulatory Visit: Payer: Self-pay | Admitting: Internal Medicine

## 2019-09-01 NOTE — Telephone Encounter (Signed)
Lorazepam last filled 07/20/2019... please advise

## 2019-09-02 DIAGNOSIS — I10 Essential (primary) hypertension: Secondary | ICD-10-CM | POA: Diagnosis not present

## 2019-09-02 DIAGNOSIS — M5136 Other intervertebral disc degeneration, lumbar region: Secondary | ICD-10-CM | POA: Diagnosis not present

## 2019-09-02 DIAGNOSIS — M4807 Spinal stenosis, lumbosacral region: Secondary | ICD-10-CM | POA: Diagnosis not present

## 2019-09-17 DIAGNOSIS — Z1159 Encounter for screening for other viral diseases: Secondary | ICD-10-CM | POA: Diagnosis not present

## 2019-09-22 DIAGNOSIS — K635 Polyp of colon: Secondary | ICD-10-CM | POA: Diagnosis not present

## 2019-09-22 DIAGNOSIS — Z86010 Personal history of colonic polyps: Secondary | ICD-10-CM | POA: Diagnosis not present

## 2019-09-22 DIAGNOSIS — D123 Benign neoplasm of transverse colon: Secondary | ICD-10-CM | POA: Diagnosis not present

## 2019-09-22 LAB — HM COLONOSCOPY

## 2019-09-24 DIAGNOSIS — K635 Polyp of colon: Secondary | ICD-10-CM | POA: Diagnosis not present

## 2019-09-24 DIAGNOSIS — D123 Benign neoplasm of transverse colon: Secondary | ICD-10-CM | POA: Diagnosis not present

## 2019-10-06 ENCOUNTER — Other Ambulatory Visit: Payer: Self-pay | Admitting: Internal Medicine

## 2019-10-06 NOTE — Telephone Encounter (Signed)
Last filled 09/01/2019...Marland Kitchen please advise

## 2019-10-07 ENCOUNTER — Other Ambulatory Visit: Payer: Self-pay | Admitting: Internal Medicine

## 2019-10-07 DIAGNOSIS — F329 Major depressive disorder, single episode, unspecified: Secondary | ICD-10-CM

## 2019-10-07 DIAGNOSIS — F03918 Unspecified dementia, unspecified severity, with other behavioral disturbance: Secondary | ICD-10-CM

## 2019-10-07 DIAGNOSIS — F32A Depression, unspecified: Secondary | ICD-10-CM

## 2019-10-07 DIAGNOSIS — F0391 Unspecified dementia with behavioral disturbance: Secondary | ICD-10-CM

## 2019-10-07 DIAGNOSIS — G4700 Insomnia, unspecified: Secondary | ICD-10-CM

## 2019-10-11 ENCOUNTER — Encounter: Payer: Self-pay | Admitting: Internal Medicine

## 2019-10-21 DIAGNOSIS — M4807 Spinal stenosis, lumbosacral region: Secondary | ICD-10-CM | POA: Diagnosis not present

## 2019-11-03 ENCOUNTER — Telehealth: Payer: Self-pay

## 2019-11-03 ENCOUNTER — Other Ambulatory Visit: Payer: Self-pay | Admitting: Anesthesiology

## 2019-11-03 DIAGNOSIS — M4807 Spinal stenosis, lumbosacral region: Secondary | ICD-10-CM

## 2019-11-03 NOTE — Telephone Encounter (Signed)
Phone call to patient to verify medication list and allergies for myelogram procedure. Spoke with [name] who is pts daughter and primary caregiver. CG instructed to hold Celexa and Remeron for 48hrs prior to myelogram appointment time. CG also instructed that pt would need to lay flat for at least 24 hours post myelogram procedure, pt would be with [place] around 2 hours the day of, and he would need a driver the day of the procedure. CG verbalized understanding and has our phone number if she were to have any questions.

## 2019-11-08 ENCOUNTER — Other Ambulatory Visit: Payer: Self-pay

## 2019-11-08 ENCOUNTER — Ambulatory Visit
Admission: RE | Admit: 2019-11-08 | Discharge: 2019-11-08 | Disposition: A | Discharge status: Home / Self Care | Payer: Medicare HMO | Source: Ambulatory Visit | Attending: Anesthesiology | Admitting: Anesthesiology

## 2019-11-08 DIAGNOSIS — M48061 Spinal stenosis, lumbar region without neurogenic claudication: Secondary | ICD-10-CM | POA: Diagnosis not present

## 2019-11-08 DIAGNOSIS — M4807 Spinal stenosis, lumbosacral region: Secondary | ICD-10-CM

## 2019-11-08 MED ORDER — DIAZEPAM 5 MG PO TABS
5.0000 mg | ORAL_TABLET | Freq: Once | ORAL | Status: AC
  Administered 2019-11-08: 5 mg via ORAL

## 2019-11-08 MED ORDER — IOPAMIDOL (ISOVUE-M 200) INJECTION 41%
18.0000 mL | Freq: Once | INTRAMUSCULAR | Status: AC
  Administered 2019-11-08: 18 mL via INTRATHECAL

## 2019-11-08 NOTE — Progress Notes (Signed)
Patient states he has been off Celexa and Remeron for at least the past two days.

## 2019-11-08 NOTE — Discharge Instructions (Signed)
Myelogram Discharge Instructions  1. Go home and rest quietly for the next 24 hours.  It is important to lie flat for the next 24 hours.  Get up only to go to the restroom.  You may lie in the bed or on a couch on your back, your stomach, your left side or your right side.  You may have one pillow under your head.  You may have pillows between your knees while you are on your side or under your knees while you are on your back.  2. DO NOT drive today.  Recline the seat as far back as it will go, while still wearing your seat belt, on the way home.  3. You may get up to go to the bathroom as needed.  You may sit up for 10 minutes to eat.  You may resume your normal diet and medications unless otherwise indicated.  Drink lots of extra fluids today and tomorrow.  4. The incidence of headache, nausea, or vomiting is about 5% (one in 20 patients).  If you develop a headache, lie flat and drink plenty of fluids until the headache goes away.  Caffeinated beverages may be helpful.  If you develop severe nausea and vomiting or a headache that does not go away with flat bed rest, call [phone].  5. You may resume normal activities after your 24 hours of bed rest is over; however, do not exert yourself strongly or do any heavy lifting tomorrow. If when you get up you have a headache when standing, go back to bed and force fluids for another 24 hours.  6. Call your physician for a follow-up appointment.  The results of your myelogram will be sent directly to your physician by the following day.  7. If you have any questions or if complications develop after you arrive home, please call [phone].  Discharge instructions have been explained to the patient.  The patient, or the person responsible for the patient, fully understands these instructions.  YOU MAY RESTART YOUR CELEXA AND REMERON TOMORROW 11/09/19 AT 10:30AM.

## 2019-11-10 ENCOUNTER — Telehealth: Payer: Self-pay | Admitting: Internal Medicine

## 2019-11-10 NOTE — Progress Notes (Signed)
  Chronic Care Management   Outreach Note  11/10/2019 Name: Alexander Booth MRN: [id number] DOB: [birth year]  Referred by: [name], NP Reason for referral : No chief complaint on file.   An unsuccessful telephone outreach was attempted today. The patient was referred to the pharmacist for assistance with care management and care coordination.   Follow Up Plan:   Raynicia Dukes [employer] Scheduler

## 2019-11-12 ENCOUNTER — Other Ambulatory Visit: Payer: Self-pay | Admitting: Internal Medicine

## 2019-11-18 ENCOUNTER — Other Ambulatory Visit: Payer: Self-pay | Admitting: Internal Medicine

## 2019-11-18 NOTE — Telephone Encounter (Signed)
Last filled 10/06/2019...Marland Kitchen please advise

## 2019-11-25 DIAGNOSIS — M4807 Spinal stenosis, lumbosacral region: Secondary | ICD-10-CM | POA: Diagnosis not present

## 2019-11-25 DIAGNOSIS — M5416 Radiculopathy, lumbar region: Secondary | ICD-10-CM | POA: Diagnosis not present

## 2019-11-25 DIAGNOSIS — M5136 Other intervertebral disc degeneration, lumbar region: Secondary | ICD-10-CM | POA: Diagnosis not present

## 2019-12-13 DIAGNOSIS — M545 Low back pain: Secondary | ICD-10-CM | POA: Diagnosis not present

## 2019-12-13 DIAGNOSIS — G20 Parkinson's disease: Secondary | ICD-10-CM | POA: Diagnosis not present

## 2019-12-13 DIAGNOSIS — I10 Essential (primary) hypertension: Secondary | ICD-10-CM | POA: Diagnosis not present

## 2019-12-13 DIAGNOSIS — M5416 Radiculopathy, lumbar region: Secondary | ICD-10-CM | POA: Diagnosis not present

## 2019-12-19 ENCOUNTER — Inpatient Hospital Stay (HOSPITAL_COMMUNITY)
Admission: EM | Admit: 2019-12-19 | Discharge: 2019-12-25 | DRG: 871 | Disposition: A | Discharge status: Home Health | Payer: Medicare HMO | Attending: Family Medicine | Admitting: Family Medicine

## 2019-12-19 ENCOUNTER — Encounter (HOSPITAL_COMMUNITY): Payer: Self-pay | Admitting: Emergency Medicine

## 2019-12-19 ENCOUNTER — Emergency Department (HOSPITAL_COMMUNITY): Payer: Medicare HMO

## 2019-12-19 ENCOUNTER — Other Ambulatory Visit: Payer: Self-pay

## 2019-12-19 DIAGNOSIS — J9601 Acute respiratory failure with hypoxia: Secondary | ICD-10-CM | POA: Diagnosis not present

## 2019-12-19 DIAGNOSIS — G8929 Other chronic pain: Secondary | ICD-10-CM | POA: Diagnosis present

## 2019-12-19 DIAGNOSIS — E785 Hyperlipidemia, unspecified: Secondary | ICD-10-CM | POA: Diagnosis present

## 2019-12-19 DIAGNOSIS — J690 Pneumonitis due to inhalation of food and vomit: Secondary | ICD-10-CM | POA: Diagnosis not present

## 2019-12-19 DIAGNOSIS — D539 Nutritional anemia, unspecified: Secondary | ICD-10-CM | POA: Diagnosis present

## 2019-12-19 DIAGNOSIS — J189 Pneumonia, unspecified organism: Secondary | ICD-10-CM | POA: Diagnosis not present

## 2019-12-19 DIAGNOSIS — E7800 Pure hypercholesterolemia, unspecified: Secondary | ICD-10-CM | POA: Diagnosis present

## 2019-12-19 DIAGNOSIS — A419 Sepsis, unspecified organism: Secondary | ICD-10-CM | POA: Diagnosis not present

## 2019-12-19 DIAGNOSIS — Z96652 Presence of left artificial knee joint: Secondary | ICD-10-CM | POA: Diagnosis present

## 2019-12-19 DIAGNOSIS — I700 Atherosclerosis of aorta: Secondary | ICD-10-CM | POA: Diagnosis present

## 2019-12-19 DIAGNOSIS — G629 Polyneuropathy, unspecified: Secondary | ICD-10-CM | POA: Diagnosis present

## 2019-12-19 DIAGNOSIS — E1165 Type 2 diabetes mellitus with hyperglycemia: Secondary | ICD-10-CM | POA: Diagnosis present

## 2019-12-19 DIAGNOSIS — R509 Fever, unspecified: Secondary | ICD-10-CM | POA: Diagnosis not present

## 2019-12-19 DIAGNOSIS — R6521 Severe sepsis with septic shock: Secondary | ICD-10-CM | POA: Diagnosis not present

## 2019-12-19 DIAGNOSIS — Z885 Allergy status to narcotic agent status: Secondary | ICD-10-CM

## 2019-12-19 DIAGNOSIS — R4182 Altered mental status, unspecified: Secondary | ICD-10-CM | POA: Diagnosis present

## 2019-12-19 DIAGNOSIS — Z66 Do not resuscitate: Secondary | ICD-10-CM | POA: Diagnosis present

## 2019-12-19 DIAGNOSIS — F17210 Nicotine dependence, cigarettes, uncomplicated: Secondary | ICD-10-CM | POA: Diagnosis present

## 2019-12-19 DIAGNOSIS — Z7982 Long term (current) use of aspirin: Secondary | ICD-10-CM

## 2019-12-19 DIAGNOSIS — J432 Centrilobular emphysema: Secondary | ICD-10-CM | POA: Diagnosis present

## 2019-12-19 DIAGNOSIS — I110 Hypertensive heart disease with heart failure: Secondary | ICD-10-CM | POA: Diagnosis present

## 2019-12-19 DIAGNOSIS — Z833 Family history of diabetes mellitus: Secondary | ICD-10-CM

## 2019-12-19 DIAGNOSIS — K219 Gastro-esophageal reflux disease without esophagitis: Secondary | ICD-10-CM | POA: Diagnosis present

## 2019-12-19 DIAGNOSIS — Z20822 Contact with and (suspected) exposure to covid-19: Secondary | ICD-10-CM | POA: Diagnosis present

## 2019-12-19 DIAGNOSIS — Z981 Arthrodesis status: Secondary | ICD-10-CM

## 2019-12-19 DIAGNOSIS — R0602 Shortness of breath: Secondary | ICD-10-CM | POA: Diagnosis not present

## 2019-12-19 DIAGNOSIS — I959 Hypotension, unspecified: Secondary | ICD-10-CM | POA: Diagnosis not present

## 2019-12-19 DIAGNOSIS — N179 Acute kidney failure, unspecified: Secondary | ICD-10-CM | POA: Diagnosis present

## 2019-12-19 DIAGNOSIS — Z791 Long term (current) use of non-steroidal anti-inflammatories (NSAID): Secondary | ICD-10-CM

## 2019-12-19 DIAGNOSIS — I5030 Unspecified diastolic (congestive) heart failure: Secondary | ICD-10-CM | POA: Diagnosis present

## 2019-12-19 DIAGNOSIS — J160 Chlamydial pneumonia: Secondary | ICD-10-CM | POA: Diagnosis not present

## 2019-12-19 DIAGNOSIS — M549 Dorsalgia, unspecified: Secondary | ICD-10-CM | POA: Diagnosis not present

## 2019-12-19 DIAGNOSIS — R0689 Other abnormalities of breathing: Secondary | ICD-10-CM | POA: Diagnosis not present

## 2019-12-19 DIAGNOSIS — D696 Thrombocytopenia, unspecified: Secondary | ICD-10-CM | POA: Diagnosis not present

## 2019-12-19 DIAGNOSIS — Z79899 Other long term (current) drug therapy: Secondary | ICD-10-CM

## 2019-12-19 DIAGNOSIS — F0391 Unspecified dementia with behavioral disturbance: Secondary | ICD-10-CM | POA: Diagnosis present

## 2019-12-19 DIAGNOSIS — Z7984 Long term (current) use of oral hypoglycemic drugs: Secondary | ICD-10-CM

## 2019-12-19 DIAGNOSIS — R0902 Hypoxemia: Secondary | ICD-10-CM | POA: Diagnosis not present

## 2019-12-19 DIAGNOSIS — F0390 Unspecified dementia without behavioral disturbance: Secondary | ICD-10-CM | POA: Diagnosis not present

## 2019-12-19 DIAGNOSIS — K760 Fatty (change of) liver, not elsewhere classified: Secondary | ICD-10-CM | POA: Diagnosis present

## 2019-12-19 DIAGNOSIS — R001 Bradycardia, unspecified: Secondary | ICD-10-CM | POA: Diagnosis not present

## 2019-12-19 DIAGNOSIS — N400 Enlarged prostate without lower urinary tract symptoms: Secondary | ICD-10-CM | POA: Diagnosis present

## 2019-12-19 DIAGNOSIS — J181 Lobar pneumonia, unspecified organism: Secondary | ICD-10-CM | POA: Diagnosis not present

## 2019-12-19 DIAGNOSIS — E872 Acidosis: Secondary | ICD-10-CM | POA: Diagnosis not present

## 2019-12-19 DIAGNOSIS — J449 Chronic obstructive pulmonary disease, unspecified: Secondary | ICD-10-CM | POA: Diagnosis not present

## 2019-12-19 DIAGNOSIS — R918 Other nonspecific abnormal finding of lung field: Secondary | ICD-10-CM | POA: Diagnosis not present

## 2019-12-19 DIAGNOSIS — R29810 Facial weakness: Secondary | ICD-10-CM | POA: Diagnosis not present

## 2019-12-19 DIAGNOSIS — Z807 Family history of other malignant neoplasms of lymphoid, hematopoietic and related tissues: Secondary | ICD-10-CM

## 2019-12-19 LAB — APTT: aPTT: 32 seconds (ref 24–36)

## 2019-12-19 LAB — COMPREHENSIVE METABOLIC PANEL
ALT: 12 U/L (ref 0–44)
AST: 29 U/L (ref 15–41)
Albumin: 2.6 g/dL — ABNORMAL LOW (ref 3.5–5.0)
Alkaline Phosphatase: 25 U/L — ABNORMAL LOW (ref 38–126)
Anion gap: 12 (ref 5–15)
BUN: 25 mg/dL — ABNORMAL HIGH (ref 8–23)
CO2: 22 mmol/L (ref 22–32)
Calcium: 7.8 mg/dL — ABNORMAL LOW (ref 8.9–10.3)
Chloride: 103 mmol/L (ref 98–111)
Creatinine, Ser: 1.61 mg/dL — ABNORMAL HIGH (ref 0.61–1.24)
GFR calc Af Amer: 47 mL/min — ABNORMAL LOW (ref >60)
GFR calc non Af Amer: 41 mL/min — ABNORMAL LOW (ref >60)
Glucose, Bld: 189 mg/dL — ABNORMAL HIGH (ref 70–99)
Potassium: 4.4 mmol/L (ref 3.5–5.1)
Sodium: 137 mmol/L (ref 135–145)
Total Bilirubin: 0.5 mg/dL (ref 0.3–1.2)
Total Protein: 5.3 g/dL — ABNORMAL LOW (ref 6.5–8.1)

## 2019-12-19 LAB — CBC WITH DIFFERENTIAL/PLATELET
Abs Immature Granulocytes: 0.03 10*3/uL (ref 0.00–0.07)
Basophils Absolute: 0 10*3/uL (ref 0.0–0.1)
Basophils Relative: 0 %
Eosinophils Absolute: 0 10*3/uL (ref 0.0–0.5)
Eosinophils Relative: 0 %
HCT: 36.1 % — ABNORMAL LOW (ref 39.0–52.0)
Hemoglobin: 11.6 g/dL — ABNORMAL LOW (ref 13.0–17.0)
Immature Granulocytes: 0 %
Lymphocytes Relative: 5 %
Lymphs Abs: 0.4 10*3/uL — ABNORMAL LOW (ref 0.7–4.0)
MCH: 33.4 pg (ref 26.0–34.0)
MCHC: 32.1 g/dL (ref 30.0–36.0)
MCV: 104 fL — ABNORMAL HIGH (ref 80.0–100.0)
Monocytes Absolute: 0.3 10*3/uL (ref 0.1–1.0)
Monocytes Relative: 5 %
Neutro Abs: 6.1 10*3/uL (ref 1.7–7.7)
Neutrophils Relative %: 90 %
Platelets: 142 10*3/uL — ABNORMAL LOW (ref 150–400)
RBC: 3.47 MIL/uL — ABNORMAL LOW (ref 4.22–5.81)
RDW: 13.5 % (ref 11.5–15.5)
WBC: 6.8 10*3/uL (ref 4.0–10.5)
nRBC: 0 % (ref 0.0–0.2)

## 2019-12-19 LAB — PROTIME-INR
INR: 1.2 (ref 0.8–1.2)
Prothrombin Time: 15 seconds (ref 11.4–15.2)

## 2019-12-19 LAB — URINALYSIS, ROUTINE W REFLEX MICROSCOPIC
Bacteria, UA: NONE SEEN
Bilirubin Urine: NEGATIVE
Glucose, UA: NEGATIVE mg/dL
Hgb urine dipstick: NEGATIVE
Ketones, ur: 5 mg/dL — AB
Leukocytes,Ua: NEGATIVE
Nitrite: NEGATIVE
Protein, ur: 30 mg/dL — AB
Specific Gravity, Urine: 1.025 (ref 1.005–1.030)
pH: 5 (ref 5.0–8.0)

## 2019-12-19 LAB — LACTIC ACID, PLASMA
Lactic Acid, Venous: 3.1 mmol/L (ref 0.5–1.9)
Lactic Acid, Venous: 2.4 mmol/L (ref 0.5–1.9)

## 2019-12-19 LAB — GLUCOSE, CAPILLARY: Glucose-Capillary: 172 mg/dL — ABNORMAL HIGH (ref 70–99)

## 2019-12-19 LAB — SARS CORONAVIRUS 2 BY RT PCR (HOSPITAL ORDER, PERFORMED IN ~~LOC~~ HOSPITAL LAB): SARS Coronavirus 2: NEGATIVE

## 2019-12-19 MED ORDER — SODIUM CHLORIDE 0.9 % IV SOLN
2.0000 g | Freq: Two times a day (BID) | INTRAVENOUS | Status: DC
  Administered 2019-12-20: 2 g via INTRAVENOUS
  Filled 2019-12-19 (×2): qty 2

## 2019-12-19 MED ORDER — ACETAMINOPHEN 325 MG PO TABS
650.0000 mg | ORAL_TABLET | Freq: Four times a day (QID) | ORAL | Status: DC | PRN
  Administered 2019-12-20: 650 mg via ORAL
  Filled 2019-12-19: qty 2

## 2019-12-19 MED ORDER — MEMANTINE HCL ER 7 MG PO CP24
7.0000 mg | ORAL_CAPSULE | Freq: Every morning | ORAL | Status: DC
  Administered 2019-12-20 – 2019-12-25 (×6): 7 mg via ORAL
  Filled 2019-12-19 (×7): qty 1

## 2019-12-19 MED ORDER — ENOXAPARIN SODIUM 40 MG/0.4ML ~~LOC~~ SOLN
40.0000 mg | SUBCUTANEOUS | Status: DC
  Administered 2019-12-19 – 2019-12-24 (×6): 40 mg via SUBCUTANEOUS
  Filled 2019-12-19 (×6): qty 0.4

## 2019-12-19 MED ORDER — DONEPEZIL HCL 10 MG PO TABS
10.0000 mg | ORAL_TABLET | Freq: Every morning | ORAL | Status: DC
  Administered 2019-12-20 – 2019-12-25 (×6): 10 mg via ORAL
  Filled 2019-12-19 (×6): qty 1

## 2019-12-19 MED ORDER — LACTATED RINGERS IV BOLUS (SEPSIS)
1000.0000 mL | Freq: Once | INTRAVENOUS | Status: AC
  Administered 2019-12-19: 1000 mL via INTRAVENOUS

## 2019-12-19 MED ORDER — INSULIN ASPART 100 UNIT/ML ~~LOC~~ SOLN
0.0000 [IU] | SUBCUTANEOUS | Status: DC
  Administered 2019-12-19 – 2019-12-20 (×2): 2 [IU] via SUBCUTANEOUS
  Administered 2019-12-20: 1 [IU] via SUBCUTANEOUS
  Administered 2019-12-20: 2 [IU] via SUBCUTANEOUS
  Administered 2019-12-20: 1 [IU] via SUBCUTANEOUS
  Administered 2019-12-20: 2 [IU] via SUBCUTANEOUS
  Administered 2019-12-21 (×2): 1 [IU] via SUBCUTANEOUS
  Administered 2019-12-21 (×3): 2 [IU] via SUBCUTANEOUS
  Administered 2019-12-22: 1 [IU] via SUBCUTANEOUS
  Administered 2019-12-22: 3 [IU] via SUBCUTANEOUS
  Administered 2019-12-22 (×2): 1 [IU] via SUBCUTANEOUS
  Administered 2019-12-22 – 2019-12-23 (×4): 2 [IU] via SUBCUTANEOUS
  Administered 2019-12-23: 1 [IU] via SUBCUTANEOUS
  Administered 2019-12-23 – 2019-12-24 (×3): 2 [IU] via SUBCUTANEOUS
  Administered 2019-12-24 (×2): 3 [IU] via SUBCUTANEOUS

## 2019-12-19 MED ORDER — METRONIDAZOLE IN NACL 5-0.79 MG/ML-% IV SOLN
500.0000 mg | Freq: Once | INTRAVENOUS | Status: AC
  Administered 2019-12-19: 500 mg via INTRAVENOUS
  Filled 2019-12-19: qty 100

## 2019-12-19 MED ORDER — IPRATROPIUM-ALBUTEROL 0.5-2.5 (3) MG/3ML IN SOLN
3.0000 mL | RESPIRATORY_TRACT | Status: DC | PRN
  Administered 2019-12-20 – 2019-12-22 (×3): 3 mL via RESPIRATORY_TRACT
  Filled 2019-12-19 (×4): qty 3

## 2019-12-19 MED ORDER — PREDNISONE 20 MG PO TABS
40.0000 mg | ORAL_TABLET | Freq: Every day | ORAL | Status: DC
  Administered 2019-12-20 – 2019-12-23 (×4): 40 mg via ORAL
  Filled 2019-12-19 (×4): qty 2

## 2019-12-19 MED ORDER — SODIUM CHLORIDE 0.9 % IV SOLN
2.0000 g | Freq: Once | INTRAVENOUS | Status: AC
  Administered 2019-12-19: 2 g via INTRAVENOUS
  Filled 2019-12-19: qty 2

## 2019-12-19 MED ORDER — VANCOMYCIN HCL IN DEXTROSE 1-5 GM/200ML-% IV SOLN: 1000.0000 mg | Freq: Once | INTRAVENOUS | Status: DC

## 2019-12-19 MED ORDER — ASPIRIN 81 MG PO CHEW
81.0000 mg | CHEWABLE_TABLET | Freq: Every day | ORAL | Status: DC
  Administered 2019-12-20 – 2019-12-25 (×6): 81 mg via ORAL
  Filled 2019-12-19 (×7): qty 1

## 2019-12-19 MED ORDER — LACTATED RINGERS IV SOLN: INTRAVENOUS | Status: DC

## 2019-12-19 MED ORDER — IOHEXOL 350 MG/ML SOLN
75.0000 mL | Freq: Once | INTRAVENOUS | Status: AC | PRN
  Administered 2019-12-19: 75 mL via INTRAVENOUS

## 2019-12-19 MED ORDER — SIMVASTATIN 20 MG PO TABS
40.0000 mg | ORAL_TABLET | Freq: Every day | ORAL | Status: DC
  Administered 2019-12-20 – 2019-12-24 (×5): 40 mg via ORAL
  Filled 2019-12-19 (×6): qty 2

## 2019-12-19 MED ORDER — ACETAMINOPHEN 325 MG PO TABS
650.0000 mg | ORAL_TABLET | Freq: Once | ORAL | Status: AC
  Administered 2019-12-19: 650 mg via ORAL
  Filled 2019-12-19: qty 2

## 2019-12-19 MED ORDER — ACETAMINOPHEN 650 MG RE SUPP: 650.0000 mg | Freq: Four times a day (QID) | RECTAL | Status: DC | PRN

## 2019-12-19 MED ORDER — IPRATROPIUM-ALBUTEROL 0.5-2.5 (3) MG/3ML IN SOLN: 3.0000 mL | RESPIRATORY_TRACT | Status: DC

## 2019-12-19 MED ORDER — DIVALPROEX SODIUM ER 500 MG PO TB24
500.0000 mg | ORAL_TABLET | Freq: Two times a day (BID) | ORAL | Status: DC
  Administered 2019-12-19 – 2019-12-25 (×12): 500 mg via ORAL
  Filled 2019-12-19 (×15): qty 1

## 2019-12-19 MED ORDER — VANCOMYCIN HCL 750 MG/150ML IV SOLN
750.0000 mg | Freq: Two times a day (BID) | INTRAVENOUS | Status: DC
  Administered 2019-12-20 – 2019-12-22 (×5): 750 mg via INTRAVENOUS
  Filled 2019-12-19 (×5): qty 150

## 2019-12-19 MED ORDER — CITALOPRAM HYDROBROMIDE 20 MG PO TABS
20.0000 mg | ORAL_TABLET | Freq: Every day | ORAL | Status: DC
  Administered 2019-12-20 – 2019-12-25 (×6): 20 mg via ORAL
  Filled 2019-12-19 (×6): qty 1

## 2019-12-19 MED ORDER — VANCOMYCIN HCL 1500 MG/300ML IV SOLN
1500.0000 mg | Freq: Once | INTRAVENOUS | Status: AC
  Administered 2019-12-19: 1500 mg via INTRAVENOUS
  Filled 2019-12-19: qty 300

## 2019-12-19 MED ORDER — ALBUTEROL SULFATE (2.5 MG/3ML) 0.083% IN NEBU
2.5000 mg | INHALATION_SOLUTION | RESPIRATORY_TRACT | Status: DC | PRN
  Administered 2019-12-22: 2.5 mg via RESPIRATORY_TRACT
  Filled 2019-12-19: qty 3

## 2019-12-19 MED ORDER — TAMSULOSIN HCL 0.4 MG PO CAPS
0.4000 mg | ORAL_CAPSULE | Freq: Every day | ORAL | Status: DC
  Administered 2019-12-20 – 2019-12-24 (×5): 0.4 mg via ORAL
  Filled 2019-12-19 (×5): qty 1

## 2019-12-19 NOTE — ED Provider Notes (Signed)
[facility] EMERGENCY DEPARTMENT Provider Note   CSN: [id number] Arrival date & time: 12/19/19  1522     History Chief Complaint  Patient presents with  . Altered Mental Status    Alexander Booth is a 74 y.o. male.  HPI    Patient presented to the emergency room for evaluation of generalized weakness and fever.  Patient has a history of dementia but this typically just affects his memory and he is able to fully function on his own.  Patient started having some some difficulties with generalized weakness last night.  He had difficulty getting off the commode and required assistance.  Patient ended up sleeping most of the day.  Today the patient was weaker and was not able to get up on his own.  Family noted that his urine was rather dark.  They called EMS and he was noted to be slightly hypotensive with a pressure in the 90s.  He had a temperature 102.  Patient's oxygen saturation was also decreased.  Patient is having some pain in his back but that is a chronic issue and not new.  He denies any abdominal pain.  No new rashes.  Patient has been fully vaccinated for Covid.   Past Medical History:  Diagnosis Date  . COPD (chronic obstructive pulmonary disease) ([place])   . Depression   . Erectile dysfunction    He's had a hx of   . Hypercholesterolemia   . Hypertension    Essential  . Lumbosacral disc disease    with chronic low back pain, has implanted nerve stimulator  . Peripheral neuropathy   . Presenile dementia   . Type 2 diabetes mellitus ([place]) 2010   takes oral meds    Patient Active Problem List   Diagnosis Date Noted  . GERD (gastroesophageal reflux disease) 06/01/2019  . Depression, recurrent ([place]) 05/16/2018  . Cigarette smoker 12/17/2016  . Dyslipidemia 01/13/2016  . Essential hypertension 01/13/2016  . Peripheral neuropathy 01/13/2016  . 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([place]) 01/10/2016  . Sleep disturbance 10/25/2014  . COPD GOLD ? /  active smoker 10/25/2014  . Chronic back pain 10/26/2013  . Type 2 diabetes mellitus with complication ([place]) 47/42/5956    Past Surgical History:  Procedure Laterality Date  . ANTERIOR CERVICAL DECOMP/DISCECTOMY FUSION    . APPENDECTOMY    . HEMORRHOID SURGERY     x2  . JOINT REPLACEMENT Left    2nd one was to change siZe  . KNEE SURGERY     Previous left total replacement  . KYPHOPLASTY N/A 12/10/2012   Procedure: Lumbar one Kyphoplasty;  Surgeon: Kristeen Miss, MD;  Location: [facility] OR[facility];  Service: Neurosurgery;  Laterality: N/A;  Lumbar One Kyphoplasty  . OTHER SURGICAL HISTORY  1992   Bone Fusion in Right Foot  . SPINAL CORD STIMULATOR INSERTION N/A 12/02/2014   Procedure: THORACIC SPINAL CORD STIMULATOR INSERTION IN[employer] 16 LEAD WITH LAMINECTOMY;  Surgeon: Erline Levine, MD;  Location: [facility] NEUR[facility] ORS;  Service: Neurosurgery;  Laterality: N/A;  THORACIC SPINAL CORD STIMULATOR INSERTION INFINION [employer] 16 LEAD WITH LAMINECTOMY  . TOTAL KNEE ARTHROPLASTY Left        Family History  Problem Relation Age of Onset  . Multiple myeloma Mother   . Diabetes Mother   . Cancer Sister        lymphoma  . Diabetes Sister   . Stroke Neg Hx     Social History   Tobacco Use  .  Smoking status: Current Every Day Smoker    Packs/day: 1.00    Years: 40.00    Pack years: 40.00  . Smokeless tobacco: Never Used  Substance Use Topics  . Alcohol use: No    Alcohol/week: 0.0 standard drinks  . Drug use: No    Home Medications Prior to Admission medications   Medication Sig Start Date End Date Taking? Authorizing Provider  Accu-Chek Softclix Lancets lancets Check sugar 2 times daily. DX E11.8 02/03/19   [name], NP  Albuterol Sulfate (PROAIR RESPICLICK) 563 (90 Base) MCG/ACT AEPB Inhale 1-2 puffs into the lungs every 4 (four) hours as needed. 05/10/19   Tanda Rockers, MD  aspirin 81 MG chewable tablet Chew 81 mg by mouth daily.     [provider]  Blood Glucose Monitoring Suppl (ACCU-CHEK AVIVA PLUS) w/Device KIT Check sugar 2 times daily. DX E11.8 02/03/19   [name], NP  citalopram (CELEXA) 20 MG tablet TAKE 1 TABLET EVERY DAY 07/01/19   [name], NP  divalproex (DEPAKOTE ER) 500 MG 24 hr tablet TAKE 2 TABLETS EVERY DAY 05/14/19   [name], NP  donepezil (ARICEPT) 10 MG tablet TAKE 1 TABLET EVERY MORNING 10/07/19   [name], NP  furosemide (LASIX) 20 MG tablet Take 1 tablet (20 mg total) by mouth 2 (two) times daily. 05/06/19   [name], NP  gabapentin (NEURONTIN) 400 MG capsule TAKE 3 CAPSULES AT BEDTIME AND MAY REPEAT DOSE ONE TIME IF NEEDED 05/28/19   [name], NP  glucose blood (ACCU-CHEK AVIVA PLUS) test strip Check sugar 2 times daily. DX E11.8 02/03/19   [name], NP  HYDROcodone-acetaminophen (NORCO) 10-325 MG tablet Take 1 tablet by mouth every 12 (twelve) hours as needed. 01/27/18   [provider]  ketoconazole (NIZORAL) 2 % cream Apply 1 application topically daily. To affected area 02/15/19   Tower, Roque Lias A, MD  LORazepam (ATIVAN) 0.5 MG tablet TAKE 1 TABLET (0.5 MG TOTAL) BY MOUTH 2 (TWO) TIMES DAILY AS NEEDED FOR ANXIETY. 11/18/19   [name], NP  meloxicam (MOBIC) 15 MG tablet Take 15 mg by mouth daily.    [provider]  memantine (NAMENDA XR) 7 MG CP24 24 hr capsule TAKE 1 CAPSULE (7 MG TOTAL) BY MOUTH EVERY MORNING. 08/09/19   [name], NP  metFORMIN (GLUCOPHAGE) 1000 MG tablet Take 1 tablet (1,000 mg total) by mouth 2 (two) times daily with a meal. 04/30/19   [name], NP  metFORMIN (GLUCOPHAGE) 500 MG tablet TAKE 1 TABLET TWICE DAILY 07/01/19   [name], NP  mirtazapine (REMERON) 45 MG tablet TAKE 1 TABLET AT BEDTIME 02/22/19   [name], NP  nitroGLYCERIN (NITROSTAT) 0.4 MG SL tablet Place 0.4 mg under the tongue every 5 (five) minutes as needed for chest pain.  01/15/16   [provider]  NONFORMULARY OR  COMPOUNDED [place]:  Peripheral Neuropathy cream - Bupivacaine 1%, Doxepin 3%, Gabapentin 6%, Pentoxifylline 3%, Topiramate 1%, apply to affected area 1-2 grams 3-4 times daily Patient taking differently: Apply 1 application topically daily as needed (PAIN). [facility]:  Peripheral Neuropathy cream - Bupivacaine 1%, Doxepin 3%, Gabapentin 6%, Pentoxifylline 3%, Topiramate 1%, apply to affected area 1-2 grams 3-4 times daily 03/21/16   Hyatt, Max T, DPM  simvastatin (ZOCOR) 40 MG tablet TAKE 1 TABLET 3 TIMES WEEKLY ON Adelfa Koh [facility] AND FRIDAY 11/12/19   Webb Silversmith  W, NP  tamsulosin (FLOMAX) 0.4 MG CAPS capsule TAKE 1 CAPSULE BY MOUTH EVERY DAY 09/01/19   [name], NP  tiZANidine (ZANAFLEX) 4 MG tablet Take 4 mg by mouth 2 (two) times daily. 03/21/14   [provider]  sildenafil (VIAGRA) 50 MG tablet Take 1 tablet (50 mg total) by mouth as needed. 06/18/11 10/15/11  Darlin Coco, MD    Allergies    Codeine  Review of Systems   Review of Systems  All other systems reviewed and are negative.   Physical Exam Updated Vital Signs BP (!) 108/54   Pulse 79   Temp 97.6 F (36.4 C) (Oral)   Resp (!) 27   Ht 1.829 m (6')   Wt 84.4 kg   SpO2 91%   BMI 25.23 kg/m   Physical Exam Vitals and nursing note reviewed.  Constitutional:      General: He is not in acute distress.    Appearance: He is well-developed.  HENT:     Head: Normocephalic and atraumatic.     Right Ear: External ear normal.     Left Ear: External ear normal.  Eyes:     General: No scleral icterus.       Right eye: No discharge.        Left eye: No discharge.     Conjunctiva/sclera: Conjunctivae normal.  Neck:     Trachea: No tracheal deviation.  Cardiovascular:     Rate and Rhythm: Normal rate and regular rhythm.  Pulmonary:     Effort: Pulmonary effort is normal. No respiratory distress.     Breath sounds: Normal breath sounds. No stridor. No wheezing or rales.    Abdominal:     General: Bowel sounds are normal. There is no distension.     Palpations: Abdomen is soft.     Tenderness: There is no abdominal tenderness. There is no guarding or rebound.  Musculoskeletal:        General: No tenderness.     Cervical back: Neck supple.  Skin:    General: Skin is warm and dry.     Findings: No rash.  Neurological:     Mental Status: He is alert.     Cranial Nerves: No cranial nerve deficit (no facial droop, extraocular movements intact, no slurred speech).     Sensory: No sensory deficit.     Motor: Weakness present. No abnormal muscle tone or seizure activity.     Coordination: Coordination normal.     Comments: Generalized weakness      ED Results / Procedures / Treatments   Labs (all labs ordered are listed, but only abnormal results are displayed) Labs Reviewed  LACTIC ACID, PLASMA - Abnormal; Notable for the following components:      Result Value   Lactic Acid, Venous 3.1 (*)    All other components within normal limits  COMPREHENSIVE METABOLIC PANEL - Abnormal; Notable for the following components:   Glucose, Bld 189 (*)    BUN 25 (*)    Creatinine, Ser 1.61 (*)    Calcium 7.8 (*)    Total Protein 5.3 (*)    Albumin 2.6 (*)    Alkaline Phosphatase 25 (*)    GFR calc non Af Amer 41 (*)    GFR calc Af Amer 47 (*)    All other components within normal limits  CBC WITH DIFFERENTIAL/PLATELET - Abnormal; Notable for the following components:   RBC 3.47 (*)    Hemoglobin 11.6 (*)  HCT 36.1 (*)    MCV 104.0 (*)    Platelets 142 (*)    Lymphs Abs 0.4 (*)    All other components within normal limits  CULTURE, BLOOD (ROUTINE X 2)  CULTURE, BLOOD (ROUTINE X 2)  URINE CULTURE  SARS CORONAVIRUS 2 BY RT PCR (HOSPITAL ORDER, [facility])  APTT  PROTIME-INR  LACTIC ACID, PLASMA  URINALYSIS, ROUTINE W REFLEX MICROSCOPIC    EKG EKG Interpretation  Date/Time:  Sunday Dec 19 2019 16:01:19 EDT Ventricular  Rate:  85 PR Interval:  106 QRS Duration: 78 QT Interval:  368 QTC Calculation: 437 R Axis:   -32 Text Interpretation: Sinus rhythm with short PR Left axis deviation Interpretation limited secondary to artifact Confirmed by Dorie Rank [phone]) on 12/19/2019 4:34:41 PM   Radiology DG Chest Port 1 View  Result Date: 12/19/2019 CLINICAL DATA:  Altered mental status with fever. EXAM: PORTABLE CHEST 1 VIEW COMPARISON:  04/27/2019 and prior radiographs FINDINGS: LEFT LOWER lung airspace disease/consolidation noted worrisome for pneumonia. No definite pleural effusion or pneumothorax. Cardiomediastinal silhouette is unremarkable. Thoracic cord stimulator again noted. IMPRESSION: LEFT LOWER lung airspace disease/consolidation worrisome for pneumonia. Radiographic follow-up to resolution recommended. Electronically Signed   By: Margarette Canada M.D.   On: 12/19/2019 16:35    Procedures .Critical Care Performed by: Dorie Rank, MD Authorized by: Dorie Rank, MD   Critical care provider statement:    Critical care time (minutes):  45   Critical care was time spent personally by me on the following activities:  Discussions with consultants, evaluation of patient's response to treatment, examination of patient, ordering and performing treatments and interventions, ordering and review of laboratory studies, ordering and review of radiographic studies, pulse oximetry, re-evaluation of patient's condition, obtaining history from patient or surrogate and review of old charts   (including critical care time)  Medications Ordered in ED Medications  vancomycin (VANCOREADY) IVPB 1500 mg/300 mL (1,500 mg Intravenous New Bag/Given 12/19/19 1751)  ceFEPIme (MAXIPIME) 2 g in sodium chloride 0.9 % 100 mL IVPB (has no administration in time range)  vancomycin (VANCOREADY) IVPB 750 mg/150 mL (has no administration in time range)  lactated ringers bolus 1,000 mL (1,000 mLs Intravenous New Bag/Given 12/19/19 1605)  ceFEPIme  (MAXIPIME) 2 g in sodium chloride 0.9 % 100 mL IVPB (0 g Intravenous Stopped 12/19/19 1642)  metroNIDAZOLE (FLAGYL) IVPB 500 mg (0 mg Intravenous Stopped 12/19/19 1754)  acetaminophen (TYLENOL) tablet 650 mg (650 mg Oral Given 12/19/19 1658)    ED Course  I have reviewed the triage vital signs and the nursing notes.  Pertinent labs & imaging results that were available during my care of the patient were reviewed by me and considered in my medical decision making (see chart for details).  Clinical Course as of Dec 19 1806  Sun Dec 19, 2019  1555 Patient presents with fever and weakness.  Sepsis is a concern although currently he is normotensive.  Patient was given 2 L of normal saline prior to arrival.  Additional fluid ordered to complete 30 cc/kg bolus.  Sepsis protocol initiated included broad-spectrum antibiotics.   [JK]  1648 Comprehensive metabolic panel(!) [JK]  9518 Creatinine increased compared to previous values   [JK]  1649 Hemoglobin decreased compared to several months ago  CBC WITH DIFFERENTIAL(!) [JK]  1649 Lactic acid elevated consistent with sepsis  Lactic acid, plasma(!!) [JK]  1748 Chest x-ray suggest pneumonia.   [AC]  1660 Blood pressure has remained stable.  Patient is on 5 L nasal cannula oxygen and remained stable in the low 90s   [JK]    Clinical Course User Index [JK] Dorie Rank, MD   MDM Rules/Calculators/A&P                     MDM Number of Diagnoses or Management Options Pneumonia of left lower lobe due to Chlamydia species: new, needed workup Sepsis, due to unspecified organism, unspecified whether acute organ dysfunction present [facility]): new, needed workup   Amount and/or Complexity of Data Reviewed Clinical lab tests: ordered and reviewed Tests in the radiology section of CPT: ordered and reviewed Discussion of test results with the performing providers: yes Decide to obtain previous medical records or to obtain history from someone other than the  patient: yes Obtain history from someone other than the patient: yes Review and summarize past medical records: yes Discuss the patient with other providers: yes Independent visualization of images, tracings, or specimens: yes  Risk of Complications, Morbidity, and/or Mortality Presenting problems: high Diagnostic procedures: high Management options: high   Patient presented with fever hypotension hypoxia.  Patient symptoms were concerning for the possibility of sepsis.  Etiology of his hypoxia was concerning for pneumonia.  Patient's chest x-ray does confirm pneumonia.  Patient was started on IV antibiotics.  Sepsis protocol followed including IV antibiotics and fluid boluses.  Patient's blood pressure has improved.  He does continue to require oxygen although is maintaining on nasal cannula oxygen.  Patient has been started on broad-spectrum antibiotics.  We will continue to monitor closely.  I will consult the medical service for admission.   Final Clinical Impression(s) / ED Diagnoses Final diagnoses:  Pneumonia of left lower lobe due to Chlamydia species  Sepsis, due to unspecified organism, unspecified whether acute organ dysfunction present [facility])     Dorie Rank, MD 12/19/19 1810

## 2019-12-19 NOTE — Progress Notes (Signed)
Per notes, 2 liters given by EMS prehospital with LR in ED > 2532 ml.

## 2019-12-19 NOTE — H&P (Addendum)
History and Physical    Alexander Booth [facility] CVE:[id number] DOB: [birth year] DOA: 12/19/2019  PCP: [name], NP Consultants:  Pulmonary Dr. Tanda Rockers Patient coming from: Home with wife  Chief Complaint: Weakness  HPI: Alexander Booth is a 74 y.o. male with PMH of Dementia, COPD/Centrilobular Emphysema, and Aortic Atherosclerosis who presents to ED with acute progressively worsening weakness and fatigue last night.  As per daughter, at 9:15 pm last night Alexander Booth went to the bathroom and sat down on toilet.  He felt very weak where he was unable to get up.  His family had to lift him from toilet and put him to sleep.  In the morning, patient still felt weak and could not get out of bed.  The family brought him to ED.  On evaluation, patient is hypoxic requiring 15L, mildly shortness of breath, fevers up to 103, mild cough.  Holley shows left sided infiltrates concerning for pneumonia.  Lactate is 3.1 mmol/L.  He has been vaccinated for COVID and COVID test is negative. Further history is limited as patient has dementia.  ED Course: Normal Saline 2L followed by LR 1L.  Vancomycin and Cefepime.  Review of Systems: As per HPI; otherwise review of systems reviewed and negative.   Ambulatory Status: Ambulates without assistance  COVID Vaccine Status:  Complete  Past Medical History:  Diagnosis Date  . COPD (chronic obstructive pulmonary disease) ([place])   . Depression   . Erectile dysfunction    He's had a hx of   . Hypercholesterolemia   . Hypertension    Essential  . Lumbosacral disc disease    with chronic low back pain, has implanted nerve stimulator  . Peripheral neuropathy   . Presenile dementia   . Type 2 diabetes mellitus ([place]) 2010   takes oral meds    Past Surgical History:  Procedure Laterality Date  . ANTERIOR CERVICAL DECOMP/DISCECTOMY FUSION    . APPENDECTOMY    . HEMORRHOID SURGERY     x2  . JOINT REPLACEMENT Left    2nd one was to change siZe  . KNEE  SURGERY     Previous left total replacement  . KYPHOPLASTY N/A 12/10/2012   Procedure: Lumbar one Kyphoplasty;  Surgeon: Kristeen Miss, MD;  Location: [place] NEURO ORS;  Service: Neurosurgery;  Laterality: N/A;  Lumbar One Kyphoplasty  . OTHER SURGICAL HISTORY  1992   Bone Fusion in Right Foot  . SPINAL CORD STIMULATOR INSERTION N/A 12/02/2014   Procedure: THORACIC SPINAL CORD STIMULATOR INSERTION INFINION [employer]OSTON [employer] 16 LEAD WITH LAMINECTOMY;  Surgeon: Erline Levine, MD;  Location: [place] NEURO ORS;  Service: Neurosurgery;  Laterality: N/A;  THORACIC SPINAL CORD STIMULATOR INSERTION INFINION [employer] 16 LEAD WITH LAMINECTOMY  . TOTAL KNEE ARTHROPLASTY Left     Social History   Socioeconomic History  . Marital status: Married    Spouse name: Vaughan Basta  . Number of children: 3  . Years of education: 103  . Highest education level: Not on file  Occupational History  . Occupation: retired    Comment: retired  Tobacco Use  . Smoking status: Current Every Day Smoker    Packs/day: 1.00    Years: 40.00    Pack years: 40.00  . Smokeless tobacco: Never Used  Substance and Sexual Activity  . Alcohol use: No    Alcohol/week: 0.0 standard drinks  . Drug use: No  . Sexual activity: Not on file  Other Topics Concern  . Not on file  Social History Narrative   Patient is right handed   Patient resides with wife,consumes 2-3 cups of caffeine daily   Social Determinants of Health   Financial Resource Strain:   . Difficulty of Paying Living Expenses:   Food Insecurity:   . Worried About Charity fundraiser in the Last Year:   . Arboriculturist in the Last Year:   Transportation Needs:   . Film/video editor (Medical):   Marland Kitchen Lack of Transportation (Non-Medical):   Physical Activity:   . Days of Exercise per Week:   . Minutes of Exercise per Session:   Stress:   . Feeling of Stress :   Social Connections:   . Frequency of Communication with Friends and Family:   . Frequency of  Social Gatherings with Friends and Family:   . Attends Religious Services:   . Active Member of Clubs or Organizations:   . Attends Archivist Meetings:   Marland Kitchen Marital Status:   Intimate Partner Violence:   . Fear of Current or Ex-Partner:   . Emotionally Abused:   Marland Kitchen Physically Abused:   . Sexually Abused:     Allergies  Allergen Reactions  . Codeine Hives and Itching    Family History  Problem Relation Age of Onset  . Multiple myeloma Mother   . Diabetes Mother   . Cancer Sister        lymphoma  . Diabetes Sister   . Stroke Neg Hx     Prior to Admission medications   Medication Sig Start Date End Date Taking? Authorizing Provider  Accu-Chek Softclix Lancets lancets Check sugar 2 times daily. DX E11.8 02/03/19   [name], NP  Albuterol Sulfate (PROAIR RESPICLICK) 245 (90 Base) MCG/ACT AEPB Inhale 1-2 puffs into the lungs every 4 (four) hours as needed. 05/10/19   Tanda Rockers, MD  aspirin 81 MG chewable tablet Chew 81 mg by mouth daily.     [provider]  Blood Glucose Monitoring Suppl (ACCU-CHEK AVIVA PLUS) w/Device KIT Check sugar 2 times daily. DX E11.8 02/03/19   [name], NP  citalopram (CELEXA) 20 MG tablet TAKE 1 TABLET EVERY DAY 07/01/19   [name], NP  divalproex (DEPAKOTE ER) 500 MG 24 hr tablet TAKE 2 TABLETS EVERY DAY 05/14/19   [name], NP  donepezil (ARICEPT) 10 MG tablet TAKE 1 TABLET EVERY MORNING 10/07/19   [name], NP  furosemide (LASIX) 20 MG tablet Take 1 tablet (20 mg total) by mouth 2 (two) times daily. 05/06/19   [name], NP  gabapentin (NEURONTIN) 400 MG capsule TAKE 3 CAPSULES AT BEDTIME AND MAY REPEAT DOSE ONE TIME IF NEEDED 05/28/19   [name], NP  glucose blood (ACCU-CHEK AVIVA PLUS) test strip Check sugar 2 times daily. DX E11.8 02/03/19   [name], NP  HYDROcodone-acetaminophen (NORCO) 10-325 MG tablet Take 1 tablet by mouth every 12 (twelve) hours as needed. 01/27/18    [provider]  ketoconazole (NIZORAL) 2 % cream Apply 1 application topically daily. To affected area 02/15/19   Tower, Roque Lias A, MD  LORazepam (ATIVAN) 0.5 MG tablet TAKE 1 TABLET (0.5 MG TOTAL) BY MOUTH 2 (TWO) TIMES DAILY AS NEEDED FOR ANXIETY. 11/18/19   [name], NP  meloxicam (MOBIC) 15 MG tablet Take 15 mg by mouth daily.    [provider]  memantine (NAMENDA XR) 7 MG CP24 24 hr capsule TAKE 1 CAPSULE (  7 MG TOTAL) BY MOUTH EVERY MORNING. 08/09/19   [name], NP  metFORMIN (GLUCOPHAGE) 1000 MG tablet Take 1 tablet (1,000 mg total) by mouth 2 (two) times daily with a meal. 04/30/19   [name], NP  metFORMIN (GLUCOPHAGE) 500 MG tablet TAKE 1 TABLET TWICE DAILY 07/01/19   [name], NP  mirtazapine (REMERON) 45 MG tablet TAKE 1 TABLET AT BEDTIME 02/22/19   [name], NP  nitroGLYCERIN (NITROSTAT) 0.4 MG SL tablet Place 0.4 mg under the tongue every 5 (five) minutes as needed for chest pain.  01/15/16   [provider]  NONFORMULARY OR COMPOUNDED [place]:  Peripheral Neuropathy cream - Bupivacaine 1%, Doxepin 3%, Gabapentin 6%, Pentoxifylline 3%, Topiramate 1%, apply to affected area 1-2 grams 3-4 times daily Patient taking differently: Apply 1 application topically daily as needed (PAIN). [facility]:  Peripheral Neuropathy cream - Bupivacaine 1%, Doxepin 3%, Gabapentin 6%, Pentoxifylline 3%, Topiramate 1%, apply to affected area 1-2 grams 3-4 times daily 03/21/16   Hyatt, Max T, DPM  simvastatin (ZOCOR) 40 MG tablet TAKE 1 TABLET 3 TIMES WEEKLY ON MONDAY, [facility] AND FRIDAY 11/12/19   [name], NP  tamsulosin (FLOMAX) 0.4 MG CAPS capsule TAKE 1 CAPSULE BY MOUTH EVERY DAY 09/01/19   [name], NP  tiZANidine (ZANAFLEX) 4 MG tablet Take 4 mg by mouth 2 (two) times daily. 03/21/14   [provider]  sildenafil (VIAGRA) 50 MG tablet Take 1 tablet (50 mg total) by mouth as needed. 06/18/11 10/15/11  Darlin Coco, MD    Physical Exam: Vitals:   12/19/19 1645 12/19/19 1730 12/19/19 1758 12/19/19 1800  BP: (!) 115/50 (!) 108/49  (!) 108/54  Pulse: 80 76  79  Resp: (!) 33 (!) 30  (!) 27  Temp:   97.6 F (36.4 C)   TempSrc:   Oral   SpO2: 96% 90%  91%  Weight:      Height:         . General:  Chronically ill appearing 74 yo male, sitting in bed, respiratory distress . Eyes:  PERRL, EOMI . ENT:  grossly normal hearing, lips & tongue, dry mucus membranes . Neck:  no LAD, masses or thyromegaly; no carotid bruits . Cardiovascular:  RRR, no murmur. No LE edema.  Marland Kitchen Respiratory:   Bibasilar wheezing, increased respiratory effort. . Abdomen:  soft, NT, ND . Back:   normal alignment, no CVAT . Skin:  no rash or induration seen on limited exam . Musculoskeletal:  grossly normal tone BUE/BLE, good ROM, no bony abnormality . Lower extremity:  No LE edema.  Limited foot exam with no ulcerations.  2+ distal pulses. Marland Kitchen Psychiatric:  grossly normal mood and affect, speech normal, dementia . Neurologic:  CN 2-12 grossly intact, moves all extremities in coordinated fashion, sensation intact  Radiological Exams on Admission: DG Chest Port 1 View  Result Date: 12/19/2019 CLINICAL DATA:  Altered mental status with fever. EXAM: PORTABLE CHEST 1 VIEW COMPARISON:  04/27/2019 and prior radiographs FINDINGS: LEFT LOWER lung airspace disease/consolidation noted worrisome for pneumonia. No definite pleural effusion or pneumothorax. Cardiomediastinal silhouette is unremarkable. Thoracic cord stimulator again noted. IMPRESSION: LEFT LOWER lung airspace disease/consolidation worrisome for pneumonia. Radiographic follow-up to resolution recommended. Electronically Signed   By: Margarette Canada M.D.   On: 12/19/2019 16:35   11/12/2018 Echo: 1. The left ventricle has normal systolic function, with an ejection  fraction of 55-60%. The cavity size was normal.  Left ventricular diastolic  parameters were normal.  2. The  right ventricle has normal systolic function. The cavity was  normal. There is no increase in right ventricular wall thickness.  3. Left atrial size was mildly dilated.  4. The aortic valve is tricuspid. Mild thickening of the aortic valve.  Mild calcification of the aortic valve.   EKG: Independently reviewed.  LAD, NSR with rate 85 bpm; nonspecific ST changes with no evidence of acute ischemia, QTc 437 ms.  Labs on Admission: I have personally reviewed the available labs and imaging studies at the time of the admission.  Pertinent labs:   Lactate 3.1 mmol/L  Na 137 K 4.4 Cl 103 Bicarb 22 Glucose 189  BUN 25 Creatinine 1.61 mg/dL with baseline ~ 1.2 mg/dL  WBC 6.8K HH 11/36 Platelets 142K   Assessment/Plan Active Problems:   * No active hospital problems. *   Acute Hypoxic Respiratory Failure - Currently NRB 15L.  Wean as tolerated. - Order CT with PE protocol.  Hypotension/Lactic Acidosis/Ketonuria:  Lactate is 3.1 mmol/L - S/P Sepsis Bundle.  Start Lactated Ringer 100 CC/hr.  2020 Echo reviewed with normal EF. - Trend Lactate q3hrs.  Sepsis due to Community Acquired Pneumonia: Presents with fevers, mild hypotension, acute kidney injury, and elevated Lactate.  CXR shows left lower lobe infiltrates concerning for pneumonia.  CT of chest is pending. - S/P Sepsis Bundle and Vancomycin Cefepime and Flagyl.   - Continue fluids.   - Add Azithromycin for synergistic effect and atypical coverage. - Check blood cultures and urine culture.  Acute COPD exacerbation/Centrilobular Emphysema - Give Prednisone 40 mg daily x 5 days. - Start Duo-Nebs q4hrs and Albuterol q2hrs PRN. - Give antibiotics as above.  History of Hypertension - Hold BP meds until blood pressure stabilizes  Diabetes Mellitus Type 2 - Lispro SSi with POC glucose q6hrs  Generalized weakness/deconditioning/chronic back pain - Physical therapy once clinically improves  Hyperlipidemia -  Statin  Aortic Atherosclerosis - Aspirin and Statin  Fatty Liver Disease  Ankylosing Spondylitis noted on CT Scan / Chronic Back Pain  Body mass index is 25.23 kg/m.  Note: This patient has been tested and is negative for the novel coronavirus COVID-19.  DVT prophylaxis: Lovenox Code Status: Full - confirmed with patient/family Family Communication: None present; I spoke with the patient's husband by telephone at the time of admission. Disposition Plan:  The patient is from: home  Anticipated d/c is to: home without [facility] services once her cardiology issues have been resolved.  Anticipated d/c date will depend on clinical response to treatment, but possibly as early as tomorrow if she has excellent response to treatment  Patient is currently: acutely ill Consults called: None Admission status: Progressive with Telemetry   George Hugh MD [facility]   How to contact the [facility] Attending or Consulting provider [place] or covering provider during after hours [place], for this patient?  1. Check the care team in [facility] and look for a) attending/consulting [facility] provider listed and b) the [facility] team listed 2. Log into [email or web address] and use [place]'s universal password to access. If you do not have the password, please contact the hospital operator. 3. Locate the [facility] provider you are looking for under [facility] and page to a number that you can be directly reached. 4. If you still have difficulty reaching the provider, please page the [facility] (Director on Call) for the Hospitalists listed on amion for assistance.   12/19/2019, 6:21 PM

## 2019-12-19 NOTE — ED Notes (Signed)
Attempted report x 2 

## 2019-12-19 NOTE — Progress Notes (Signed)
Pharmacy Antibiotic Note  Alexander Booth is a 74 y.o. male admitted on 12/19/2019 with sepsis.  Pharmacy has been consulted for vancomycin and cefepime dosing. Pt is febrile with Tmax 103.2 and WBC is WNL. SCr is elevated at 1.61. and lactic acid is elevated at 3.1.   Plan: Vancomycin 1500mg  IV x 1 then 750mg  IV Q12H Cefepime 2gm IV Q12H F/u renal fxn, C&S, clinical status and trough at SS  Height: 6' (182.9 cm) Weight: 84.4 kg (186 lb) IBW/kg (Calculated) : 77.6  Temp (24hrs), Avg:103.2 F (39.6 C), Min:103.2 F (39.6 C), Max:103.2 F (39.6 C)  Recent Labs  Lab 12/19/19 1556  WBC 6.8  CREATININE 1.61*  LATICACIDVEN 3.1*    Estimated Creatinine Clearance: 42.8 mL/min (A) (by C-G formula based on SCr of 1.61 mg/dL (H)).    Allergies  Allergen Reactions  . Codeine Hives and Itching    Antimicrobials this admission: Vanc 5/30>> Cefepime 5/30>> Flagyl x 1 5/30  Dose adjustments this admission: N/A  Microbiology results: Pending  Thank you for allowing pharmacy to be a part of this patient's care.  Roselie Cirigliano, Rande Lawman 12/19/2019 3:55 PM

## 2019-12-19 NOTE — ED Triage Notes (Signed)
Pt BIB [facility] from home, pt c/o generalized weakness that started last night. Family reports hx dementia, but pt is normally able to care for himself and answer questions. Family states pt was unable to get up out of the bed on his own. Denies pain/shortness of breath. SpO2 82% on room air, placed on NRB, improved to 96%. EMS VS: BP 94/60, HR 120, RR 40, given 2L NS by EMS, BP improved to 138/70, HR 86.

## 2019-12-20 DIAGNOSIS — N179 Acute kidney failure, unspecified: Secondary | ICD-10-CM

## 2019-12-20 DIAGNOSIS — G8929 Other chronic pain: Secondary | ICD-10-CM

## 2019-12-20 DIAGNOSIS — M549 Dorsalgia, unspecified: Secondary | ICD-10-CM

## 2019-12-20 LAB — BASIC METABOLIC PANEL
Anion gap: 10 (ref 5–15)
BUN: 26 mg/dL — ABNORMAL HIGH (ref 8–23)
CO2: 24 mmol/L (ref 22–32)
Calcium: 8.1 mg/dL — ABNORMAL LOW (ref 8.9–10.3)
Chloride: 103 mmol/L (ref 98–111)
Creatinine, Ser: 1.3 mg/dL — ABNORMAL HIGH (ref 0.61–1.24)
GFR calc Af Amer: >60 mL/min (ref >60)
GFR calc non Af Amer: 53 mL/min — ABNORMAL LOW (ref >60)
Glucose, Bld: 149 mg/dL — ABNORMAL HIGH (ref 70–99)
Potassium: 4.3 mmol/L (ref 3.5–5.1)
Sodium: 137 mmol/L (ref 135–145)

## 2019-12-20 LAB — BRAIN NATRIURETIC PEPTIDE: B Natriuretic Peptide: 674.3 pg/mL — ABNORMAL HIGH (ref 0.0–100.0)

## 2019-12-20 LAB — CBC
HCT: 33.9 % — ABNORMAL LOW (ref 39.0–52.0)
Hemoglobin: 11 g/dL — ABNORMAL LOW (ref 13.0–17.0)
MCH: 33.4 pg (ref 26.0–34.0)
MCHC: 32.4 g/dL (ref 30.0–36.0)
MCV: 103 fL — ABNORMAL HIGH (ref 80.0–100.0)
Platelets: 130 10*3/uL — ABNORMAL LOW (ref 150–400)
RBC: 3.29 MIL/uL — ABNORMAL LOW (ref 4.22–5.81)
RDW: 13.5 % (ref 11.5–15.5)
WBC: 9.9 10*3/uL (ref 4.0–10.5)
nRBC: 0 % (ref 0.0–0.2)

## 2019-12-20 LAB — URINE CULTURE: Culture: NO GROWTH

## 2019-12-20 LAB — LACTIC ACID, PLASMA
Lactic Acid, Venous: 2.6 mmol/L (ref 0.5–1.9)
Lactic Acid, Venous: 2.6 mmol/L (ref 0.5–1.9)

## 2019-12-20 LAB — GLUCOSE, CAPILLARY
Glucose-Capillary: 148 mg/dL — ABNORMAL HIGH (ref 70–99)
Glucose-Capillary: 148 mg/dL — ABNORMAL HIGH (ref 70–99)
Glucose-Capillary: 166 mg/dL — ABNORMAL HIGH (ref 70–99)
Glucose-Capillary: 168 mg/dL — ABNORMAL HIGH (ref 70–99)
Glucose-Capillary: 152 mg/dL — ABNORMAL HIGH (ref 70–99)
Glucose-Capillary: 125 mg/dL — ABNORMAL HIGH (ref 70–99)

## 2019-12-20 MED ORDER — GABAPENTIN 300 MG PO CAPS
300.0000 mg | ORAL_CAPSULE | Freq: Every day | ORAL | Status: DC
  Administered 2019-12-20 – 2019-12-24 (×5): 300 mg via ORAL
  Filled 2019-12-20 (×6): qty 1

## 2019-12-20 MED ORDER — LACTATED RINGERS IV BOLUS
1000.0000 mL | Freq: Once | INTRAVENOUS | Status: AC
  Administered 2019-12-20: 1000 mL via INTRAVENOUS

## 2019-12-20 MED ORDER — METHOCARBAMOL 500 MG PO TABS
500.0000 mg | ORAL_TABLET | Freq: Three times a day (TID) | ORAL | Status: DC
  Administered 2019-12-20 – 2019-12-25 (×17): 500 mg via ORAL
  Filled 2019-12-20 (×17): qty 1

## 2019-12-20 MED ORDER — ALUM & MAG HYDROXIDE-SIMETH 200-200-20 MG/5ML PO SUSP
30.0000 mL | Freq: Four times a day (QID) | ORAL | Status: DC | PRN
  Administered 2019-12-20: 30 mL via ORAL
  Filled 2019-12-20: qty 30

## 2019-12-20 MED ORDER — SACCHAROMYCES BOULARDII 250 MG PO CAPS
250.0000 mg | ORAL_CAPSULE | Freq: Two times a day (BID) | ORAL | Status: DC
  Administered 2019-12-20 – 2019-12-25 (×11): 250 mg via ORAL
  Filled 2019-12-20 (×11): qty 1

## 2019-12-20 MED ORDER — PIPERACILLIN-TAZOBACTAM 3.375 G IVPB
3.3750 g | Freq: Three times a day (TID) | INTRAVENOUS | Status: DC
  Administered 2019-12-20 – 2019-12-25 (×16): 3.375 g via INTRAVENOUS
  Filled 2019-12-20 (×17): qty 50

## 2019-12-20 MED ORDER — SODIUM CHLORIDE 0.9 % IV SOLN
500.0000 mg | INTRAVENOUS | Status: AC
  Administered 2019-12-20 – 2019-12-23 (×4): 500 mg via INTRAVENOUS
  Filled 2019-12-20 (×4): qty 500

## 2019-12-20 MED ORDER — LACTATED RINGERS IV SOLN: INTRAVENOUS | Status: DC

## 2019-12-20 MED ORDER — HYDROCODONE-ACETAMINOPHEN 10-325 MG PO TABS
1.0000 | ORAL_TABLET | Freq: Four times a day (QID) | ORAL | Status: DC | PRN
  Administered 2019-12-20 – 2019-12-25 (×18): 1 via ORAL
  Filled 2019-12-20 (×19): qty 1

## 2019-12-20 NOTE — Evaluation (Signed)
Patient is a mouth breather. O2 stats decreased to 89 during HS. 6LNC maintained to nares. O2 sats rebound to WDL with minimal efforts when coached to inhale through nose and exhale through mouth.

## 2019-12-20 NOTE — Progress Notes (Signed)
Pharmacy Antibiotic Note  Alexander Booth is a 74 y.o. male admitted on 12/19/2019 with pneumonia.  Pharmacy has been consulted for Vanco, Zosyn dosing.   ID: Abx D#2 for sepsis and CAP with possibly aspiration +/- COPD exac - Tmax 103.2, WBC WNL, SCr 1.3 down, LA 3.1>2.6 still elevated today.  Vanc 5/30>> Cefepime 5/30>> 5/31 Zosyn 5/31>> Azithro 5/31 x 5d Flagyl x 1 5/30  5/30: BC x 2>> 5/30: COVID: neg  Plan: Con't Vanco 750mg  IV Q12H D/c Cefepime Start Zosyn 3.375g IV q8hr.    Height: 6' (182.9 cm) Weight: 84.4 kg (186 lb) IBW/kg (Calculated) : 77.6  Temp (24hrs), Avg:99 F (37.2 C), Min:97.5 F (36.4 C), Max:103.2 F (39.6 C)  Recent Labs  Lab 12/19/19 1556 12/19/19 1820 12/19/19 2313 12/20/19 0348  WBC 6.8  --   --  9.9  CREATININE 1.61*  --   --  1.30*  LATICACIDVEN 3.1* 2.4* 2.6* 2.6*    Estimated Creatinine Clearance: 53.1 mL/min (A) (by C-G formula based on SCr of 1.3 mg/dL (H)).    Allergies  Allergen Reactions  . Codeine Hives and Itching    Chantella Creech S. Alford Highland, PharmD, BCPS Clinical Staff Pharmacist [email or web address]  Wayland Salinas 12/20/2019 10:59 AM

## 2019-12-20 NOTE — Progress Notes (Signed)
[facility] PROGRESS NOTE   Alexander Booth [id number] DOB: 08/13/1945 DOA: 12/19/2019  PCP: [name], NP  Brief History/Interval Summary: 74 y.o. male with PMH of Dementia, COPD/Centrilobular Emphysema, and Aortic Atherosclerosis who presented to ED with acute progressively worsening weakness and fatigue.  As per daughter, the night before admission patient went to the bathroom and sat down on toilet.  He felt very weak where he was unable to get up.  His family had to lift him from toilet and put him to sleep.  In the morning, patient still felt weak and could not get out of bed.  The family brought him to ED.  On evaluation, patient was noted to be hypoxic requiring 15L, mildly shortness of breath, fevers up to 103, mild cough.  Haena shows left sided infiltrates concerning for pneumonia.  Lactate is 3.1 mmol/L.  He has been vaccinated for COVID and COVID test is negative. Further history is limited as patient has dementia.   Reason for Visit: Community-acquired pneumonia, possible aspiration  Consultants: None  Procedures: None  Antibiotics: Anti-infectives (From admission, onward)   Start     Dose/Rate Route Frequency Ordered Stop   12/20/19 0600  vancomycin (VANCOREADY) IVPB 750 mg/150 mL     750 mg 150 mL/hr over 60 Minutes Intravenous Every 12 hours 12/19/19 1645     12/20/19 0400  ceFEPIme (MAXIPIME) 2 g in sodium chloride 0.9 % 100 mL IVPB     2 g 200 mL/hr over 30 Minutes Intravenous Every 12 hours 12/19/19 1645     12/19/19 1600  ceFEPIme (MAXIPIME) 2 g in sodium chloride 0.9 % 100 mL IVPB     2 g 200 mL/hr over 30 Minutes Intravenous  Once 12/19/19 1553 12/19/19 1642   12/19/19 1600  metroNIDAZOLE (FLAGYL) IVPB 500 mg     500 mg 100 mL/hr over 60 Minutes Intravenous  Once 12/19/19 1553 12/19/19 1754   12/19/19 1600  vancomycin (VANCOCIN) IVPB 1000 mg/200 mL premix  Status:  Discontinued     1,000 mg 200 mL/hr over 60 Minutes Intravenous  Once  12/19/19 1553 12/19/19 1554   12/19/19 1600  vancomycin (VANCOREADY) IVPB 1500 mg/300 mL     1,500 mg 150 mL/hr over 120 Minutes Intravenous  Once 12/19/19 1554 12/19/19 2013      Subjective/Interval History: States that he is feeling slightly better this morning compared to yesterday.  Not as short of breath as yesterday.  He is complaining of back pain which is chronic for him.  His wife and daughter are at the bedside  ROS: Denies any chest pain.  No nausea vomiting.    Assessment/Plan:  Community-acquired pneumonia with concern for aspiration/acute respiratory failure with hypoxia/sepsis present on admission/septic shock Patient found to have bilateral pneumonia which is the reason for his hypoxia.  CT scan did not show any PE.  Patient was noted to have elevated lactic acid level.  There was concern for sepsis on admission.  Patient was started on broad-spectrum antibiotics.  Patient denies any symptoms suggestive of dysphagia.  Will get a swallow evaluation in any case as he could be silently aspirating.  He is currently noted to be on vancomycin and cefepime.  He was given metronidazole in the ED.  Will change cefepime to Zosyn for now and add azithromycin.  Follow-up on cultures.  Blood pressures have improved.  Continue IV fluids but at a lower rate.    Acute COPD exacerbation He was noted to be  wheezing.  He has a history of COPD he is currently getting steroids.  Is a treatments.  Acute renal failure Baseline creatinine around 1.1-1.1.  Noted to be 1.6 at admission.  Patient was hydrated with improvement noted this morning.  Monitor urine output.  Essential hypertension His blood pressure medications were placed on hold due to sepsis.  Agree with the same.  Monitor closely.  Chronic back pain/history of ankylosing spondylitis Resume his home medications including hydrocodone and Robaxin but at a lower dose.  He apparently was started on by his pain management specialist  recently but patient did not tolerate it well so it is he is not taking that anymore.  Also on Neurontin which will be continued at a lower dose.  Diabetes mellitus type 2, uncontrolled with hyperglycemia Monitor CBGs.  SSI.  History of dementia Noted to be on Namenda which is being continued.  Also on Aricept.  Also on Depakote.  History of BPH Continue Flomax.  Aortic atherosclerosis  Aspirin and statin.  Macrocytic anemia Monitor hemoglobin closely.  No evidence of overt bleeding.  TSH was normal in October.  B12 level was on the lower side at 261 in 2020.    DVT Prophylaxis: Lovenox Code Status: CODE STATUS confirmed with the patient's family.  He is DNR Family Communication: Discussed with the patient's daughter and wife Disposition Plan:  Status is: Inpatient  Remains inpatient appropriate because:Hemodynamically unstable and IV treatments appropriate due to intensity of illness or inability to take PO.  Hypoxic   Dispo: The patient is from: Home              Anticipated d/c is to: Home              Anticipated d/c date is: 3 days              Patient currently is not medically stable to d/c.       Medications:  Scheduled: . aspirin  81 mg Oral Daily  . citalopram  20 mg Oral Daily  . divalproex  500 mg Oral BID  . donepezil  10 mg Oral q morning - 10a  . enoxaparin (LOVENOX) injection  40 mg Subcutaneous Q24H  . gabapentin  300 mg Oral QHS  . insulin aspart  0-9 Units Subcutaneous Q4H  . memantine  7 mg Oral q morning - 10a  . methocarbamol  500 mg Oral TID  . predniSONE  40 mg Oral Q breakfast  . simvastatin  40 mg Oral q1800  . tamsulosin  0.4 mg Oral q1800   Continuous: . ceFEPime (MAXIPIME) IV 2 g (12/20/19 0338)  . lactated ringers 100 mL/hr at 12/20/19 0548  . vancomycin 750 mg (12/20/19 0529)   [id number] **OR** acetaminophen, albuterol, HYDROcodone-acetaminophen, ipratropium-albuterol   Objective:  Vital Signs  Vitals:   12/19/19  2145 12/19/19 2236 12/20/19 0325 12/20/19 0833  BP: (!) 125/57 (!) 139/93 137/63 (!) 156/53  Pulse: 75 76 82   Resp: (!) 27 (!) 23 (!) 26 (!) 22  Temp:  97.7 F (36.5 C) 99.1 F (37.3 C) (!) 97.5 F (36.4 C)  TempSrc:  Oral Oral Oral  SpO2: 93% 95% 92% 95%  Weight:      Height:        Intake/Output Summary (Last 24 hours) at 12/20/2019 1023 Last data filed at 12/20/2019 0125 Gross per 24 hour  Intake 2338.33 ml  Output --  Net 2338.33 ml   Filed Weights   12/19/19 1548  Weight: 84.4 kg    General appearance: Awake alert.  In no distress Resp: Mildly tachypneic.  No use of accessory muscles.  Crackles bilateral bases.  No wheezing or rhonchi.  Cardio: S1-S2 is normal regular.  No S3-S4.  No rubs murmurs or bruit GI: Abdomen is soft.  Nontender nondistended.  Bowel sounds are present normal.  No masses organomegaly Extremities: No edema.  Full range of motion of lower extremities. Neurologic: .  No focal neurological deficits.    Lab Results:  Data Reviewed: I have personally reviewed following labs and imaging studies  CBC: Recent Labs  Lab 12/19/19 1556 12/20/19 0348  WBC 6.8 9.9  NEUTROABS 6.1  --   HGB 11.6* 11.0*  HCT 36.1* 33.9*  MCV 104.0* 103.0*  PLT 142* 130*    Basic Metabolic Panel: Recent Labs  Lab 12/19/19 1556 12/20/19 0348  NA 137 137  K 4.4 4.3  CL 103 103  CO2 22 24  GLUCOSE 189* 149*  BUN 25* 26*  CREATININE 1.61* 1.30*  CALCIUM 7.8* 8.1*    GFR: Estimated Creatinine Clearance: 53.1 mL/min (A) (by C-G formula based on SCr of 1.3 mg/dL (H)).  Liver Function Tests: Recent Labs  Lab 12/19/19 1556  AST 29  ALT 12  ALKPHOS 25*  BILITOT 0.5  PROT 5.3*  ALBUMIN 2.6*     Coagulation Profile: Recent Labs  Lab 12/19/19 1556  INR 1.2     CBG: Recent Labs  Lab 12/19/19 2304 12/20/19 0310 12/20/19 0856  GLUCAP 172* 148* 148*      Recent Results (from the past 240 hour(s))  Blood Culture (routine x 2)     Status:  None (Preliminary result)   Collection Time: 12/19/19  3:51 PM   Specimen: BLOOD  Result Value Ref Range Status   Specimen Description BLOOD BLOOD LEFT FOREARM  Final   Special Requests   Final    BOTTLES DRAWN AEROBIC AND ANAEROBIC Blood Culture adequate volume   Culture   Final    NO GROWTH < 24 HOURS Performed at [facility], [place] [address] [place], [place] [address]    Report Status PENDING  Incomplete  SARS Coronavirus 2 by RT PCR ([facility] order, performed in [facility]) Nasopharyngeal Nasopharyngeal Swab     Status: None   Collection Time: 12/19/19  3:52 PM   Specimen: Nasopharyngeal Swab  Result Value Ref Range Status   SARS Coronavirus 2 NEGATIVE NEGATIVE Final    Comment: (NOTE) SARS-CoV-2 target nucleic acids are NOT DETECTED. The SARS-CoV-2 RNA is generally detectable in upper and lower respiratory specimens during the acute phase of infection. The lowest concentration of SARS-CoV-2 viral copies this assay can detect is 250 copies / mL. A negative result does not preclude SARS-CoV-2 infection and should not be used as the sole basis for treatment or other patient management decisions.  A negative result may occur with improper specimen collection / handling, submission of specimen other than nasopharyngeal swab, presence of viral mutation(s) within the areas targeted by this assay, and inadequate number of viral copies (<250 copies / mL). A negative result must be combined with clinical observations, patient history, and epidemiological information. Fact Sheet for Patients:   StrictlyIdeas.no Fact Sheet for Healthcare Providers: BankingDealers.co.za This test is not yet approved or cleared  by the [place] FDA and has been authorized for detection and/or diagnosis of SARS-CoV-2 by FDA under an Emergency Use Authorization (EUA).  This EUA will remain in effect (  meaning this test can be used) for  the duration of the COVID-19 declaration under Section 564(b)(1) of the Act, 21 U.S.C. section 360bbb-3(b)(1), unless the authorization is terminated or revoked sooner. Performed at [facility], [place] [address] [place], [place] [address]   Blood Culture (routine x 2)     Status: None (Preliminary result)   Collection Time: 12/19/19  4:09 PM   Specimen: BLOOD  Result Value Ref Range Status   Specimen Description BLOOD RIGHT ANTECUBITAL  Final   Special Requests   Final    BOTTLES DRAWN AEROBIC AND ANAEROBIC Blood Culture adequate volume   Culture   Final    NO GROWTH < 24 HOURS Performed at [facility], [place] [address] [place], [place] [address]    Report Status PENDING  Incomplete      Radiology Studies: CT ANGIO CHEST PE W OR WO CONTRAST  Result Date: 12/19/2019 CLINICAL DATA:  74 year old male with concern for pulmonary embolism. EXAM: CT ANGIOGRAPHY CHEST WITH CONTRAST TECHNIQUE: Multidetector CT imaging of the chest was performed using the standard protocol during bolus administration of intravenous contrast. Multiplanar CT image reconstructions and MIPs were obtained to evaluate the vascular anatomy. CONTRAST:  56mL OMNIPAQUE IOHEXOL 350 MG/ML SOLN COMPARISON:  Chest radiograph dated 12/19/2019. FINDINGS: Cardiovascular: There is no cardiomegaly or pericardial effusion. Coronary vascular calcification primarily involving the LAD and RCA. There is mild atherosclerotic calcification of the thoracic aorta. Evaluation of the pulmonary arteries is limited due to respiratory motion artifact. No large or central pulmonary artery embolus identified. Mediastinum/Nodes: No hilar or mediastinal adenopathy. The esophagus is grossly unremarkable. No mediastinal fluid collection. Lungs/Pleura: There is a large area of consolidative change in the left lower lobe most concerning for pneumonia. Clinical correlation and follow-up to resolution recommended. Smaller area of consolidation  involving the right lung base noted. There is background of emphysema. Patchy areas of interstitial coarsening/density involving the upper lobes may represent progression of disease or pneumonia. No large pleural effusion or pneumothorax. The central airways are patent. Upper Abdomen: Fatty liver. Musculoskeletal: Degenerative changes of the spine. No acute osseous pathology. Findings of ankylosing spondylitis. Spinal stimulator wire over the midthoracic spine. Review of the MIP images confirms the above findings. IMPRESSION: 1. No CT evidence of central pulmonary artery embolus. 2. Bilateral lower lobe pneumonia, left greater right. Clinical correlation and follow-up to resolution recommended. 3. Aortic Atherosclerosis (ICD10-I70.0) and Emphysema (ICD10-J43.9). Electronically Signed   By: Anner Crete M.D.   On: 12/19/2019 19:31   DG Chest Port 1 View  Result Date: 12/19/2019 CLINICAL DATA:  Altered mental status with fever. EXAM: PORTABLE CHEST 1 VIEW COMPARISON:  04/27/2019 and prior radiographs FINDINGS: LEFT LOWER lung airspace disease/consolidation noted worrisome for pneumonia. No definite pleural effusion or pneumothorax. Cardiomediastinal silhouette is unremarkable. Thoracic cord stimulator again noted. IMPRESSION: LEFT LOWER lung airspace disease/consolidation worrisome for pneumonia. Radiographic follow-up to resolution recommended. Electronically Signed   By: Margarette Canada M.D.   On: 12/19/2019 16:35       LOS: 1 day   [facility] Pager on [email or web address]  12/20/2019, 10:23 AM

## 2019-12-20 NOTE — Plan of Care (Signed)

## 2019-12-20 NOTE — Evaluation (Signed)
Clinical/Bedside Swallow Evaluation Patient Details  Name: Alexander Booth MRN: [id number] Date of Birth: 1946/04/17  Today's Date: 12/20/2019 Time: SLP Start Time (ACUTE ONLY): [id number] SLP Stop Time (ACUTE ONLY): 1430 SLP Time Calculation (min) (ACUTE ONLY): 13 min  Past Medical History:  Past Medical History:  Diagnosis Date  . COPD (chronic obstructive pulmonary disease) ([place])   . Depression   . Erectile dysfunction    He's had a hx of   . Hypercholesterolemia   . Hypertension    Essential  . Lumbosacral disc disease    with chronic low back pain, has implanted nerve stimulator  . Peripheral neuropathy   . Presenile dementia   . Type 2 diabetes mellitus ([place]) 2010   takes oral meds   Past Surgical History:  Past Surgical History:  Procedure Laterality Date  . ANTERIOR CERVICAL DECOMP/DISCECTOMY FUSION    . APPENDECTOMY    . HEMORRHOID SURGERY     x2  . JOINT REPLACEMENT Left    2nd one was to change siZe  . KNEE SURGERY     Previous left total replacement  . KYPHOPLASTY N/A 12/10/2012   Procedure: Lumbar one Kyphoplasty;  Surgeon: Kristeen Miss, MD;  Location: [facility];  Service: Neurosurgery;  Laterality: N/A;  Lumbar One Kyphoplasty  . OTHER SURGICAL HISTORY  1992   Bone Fusion in Right Foot  . SPINAL CORD STIMULATOR INSERTION N/A 12/02/2014   Procedure: THORACIC SPINAL CORD STIMULATOR INSERTION [employer] 16 LEAD WITH LAMINECTOMY;  Surgeon: Erline Levine, MD;  Location: [facility];  Service: Neurosurgery;  Laterality: N/A;  THORACIC SPINAL CORD STIMULATOR INSERTION INFINION [employer] 16 LEAD WITH LAMINECTOMY  . TOTAL KNEE ARTHROPLASTY Left    HPI:  Alexander Booth is a 74 y.o. male with PMH of Dementia, COPD/Centrilobular Emphysema, and Aortic Atherosclerosis who presents to ED with acute progressively worsening weakness and fatigue. Chest CT on 5/31 reported: "Bilateral lower lobe pneumonia, left greater right."    Assessment / Plan /  Recommendation Clinical Impression  Pt was seen for a bedside swallow evaluation in the setting of bilateral lower lobe PNA.  Pt was encountered awake/alert with wife present at bedside.  Pt and wife reported that he consumes regular solids and thin liquids at baseline without coughing/choking or difficulty.  Wife reported that pt has had poor PO intake since admission.  Pt attributed his decreased PO intake to acute stomach discomfort.  Oral mechanism exam was unremarkable.  Pt has top and bottom dentures that he wears for all PO intake.  He consumed trials of thin liquid, puree, and regular solids during this evaluation.  Pt was noted to be mildly impulsive with liquid intake c/b taking multiple large sips in a row.  An audible swallow was observed with large sips of liquid, but no overt s/sx of aspiration were observed.  Mastication of regular solids was effective and suspect timely AP transport/swallow initiation with all trials.  No overt s/sx of aspiration were observed with any trials.  Recommend continuation of regular solids and thin liquids with medications administered whole with liquid.  Pt would benefit from intermittent supervision to cue for the following compensatory strategies: 1) Small bites/sips 2) Slow rate of intake 3) Sit upright as possible 4) Remain upright for 30+ minutes after intake.  No further skilled ST is warranted at this time.  Please re-consult if additional needs arise.   SLP Visit Diagnosis: Dysphagia, unspecified (R13.10)    Aspiration Risk  Mild aspiration  risk    Diet Recommendation Regular;Thin liquid   Liquid Administration via: Cup;Straw Medication Administration: Whole meds with liquid Supervision: Patient able to self feed;Intermittent supervision to cue for compensatory strategies Compensations: Slow rate;Small sips/bites Postural Changes: Seated upright at 90 degrees;Remain upright for at least 30 minutes after po intake    Other  Recommendations Oral  Care Recommendations: Oral care BID   Follow up Recommendations None        Swallow Study   General Date of Onset: 12/20/19 HPI: Alexander Booth is a 74 y.o. male with PMH of Dementia, COPD/Centrilobular Emphysema, and Aortic Atherosclerosis who presents to ED with acute progressively worsening weakness and fatigue. Chest CT on 5/31 reproted: "Bilateral lower lobe pneumonia, left greater right."  Type of Study: Bedside Swallow Evaluation Previous Swallow Assessment: None  Diet Prior to this Study: Regular;Thin liquids Temperature Spikes Noted: Yes Respiratory Status: Other (comment)(nasal cannula ) History of Recent Intubation: No Behavior/Cognition: Alert;Cooperative;Pleasant mood Oral Cavity Assessment: Within Functional Limits Oral Care Completed by SLP: No Oral Cavity - Dentition: Dentures, bottom;Dentures, top Vision: Functional for self-feeding Self-Feeding Abilities: Able to feed self;Needs set up Patient Positioning: Upright in bed Baseline Vocal Quality: Hoarse Volitional Swallow: Able to elicit    Oral/Motor/Sensory Function Overall Oral Motor/Sensory Function: Within functional limits   Ice Chips Ice chips: Not tested   Thin Liquid Thin Liquid: Within functional limits Presentation: Cup;Straw    Nectar Thick Nectar Thick Liquid: Not tested   Honey Thick Honey Thick Liquid: Not tested   Puree Puree: Within functional limits Presentation: Spoon   Solid     Solid: Within functional limits Presentation: Vicie Mutters., M.S., [place] Office: [phone]  [place] 12/20/2019,2:41 PM

## 2019-12-20 NOTE — Plan of Care (Signed)
  Problem: Education: Goal: Knowledge of General Education information will improve Description: Including pain rating scale, medication(s)/side effects and non-pharmacologic comfort measures Outcome: Progressing   Problem: Health Behavior/Discharge Planning: Goal: Ability to manage health-related needs will improve Outcome: Progressing   Problem: Pain Managment: Goal: General experience of comfort will improve Outcome: Progressing  Family repeatedly requesting that home pain medications be restarted, clarification required as patient had gabapentin, buprenophrine, vicodin and robaxin listed as home mediciations and is admitted for weakness, pneumonia/SOB and altered mental status. Discussed with family side effects of these medications include AMS and suppressed respiratory status.  Problem: Nutrition: Goal: Adequate nutrition will be maintained Outcome: Not Progressing  Patient refuses breakfast, accepted snack with medications. Family states patient with poor appetite for 3 days.

## 2019-12-20 NOTE — Evaluation (Signed)
Physical Therapy Evaluation Patient Details Name: Alexander Booth MRN: [id number] DOB: 1946/06/27 Today's Date: 12/20/2019   History of Present Illness  Pt is 73 y.o. male with PMH of Dementia, COPD/Centrilobular Emphysema, and Aortic Atherosclerosis who presented to ED with acute progressively worsening weakness and fatigue.   Pt found to have PNE and requiring 15 L O2 initially.  Clinical Impression  Pt admitted with above diagnosis. Pt was able to transfer and ambulate with supervision to min guard level with cues for safety, breathing technique, and transfer technique.  Pt does have a hx of falls.  Today he required 6 L O2 with sats stable with activity but DOE of 3/4.  Pt with good rehab potential.   Pt currently with functional limitations due to the deficits listed below (see PT Problem List). Pt will benefit from skilled PT to increase their independence and safety with mobility to allow discharge to the venue listed below.  Reports initially not interested in HHPT, but discussed role, hx of falls, current impairments and he was agreeable to consider.    Follow Up Recommendations Home health PT;Supervision/Assistance - 24 hour    Equipment Recommendations  None recommended by PT(has dme)    Recommendations for Other Services       Precautions / Restrictions Precautions Precautions: Fall Precaution Comments: monitor O2      Mobility  Bed Mobility Overal bed mobility: Needs Assistance Bed Mobility: Supine to Sit;Sit to Supine     Supine to sit: Supervision;HOB elevated Sit to supine: Supervision;HOB elevated      Transfers Overall transfer level: Needs assistance Equipment used: Rolling walker (2 wheeled);None Transfers: Sit to/from Stand Sit to Stand: Min guard         General transfer comment: cues for safe hand placement and to wait for line/lead management; min guard for safety  Ambulation/Gait Ambulation/Gait assistance: Min guard Gait Distance (Feet):  100 Feet Assistive device: Rolling walker (2 wheeled) Gait Pattern/deviations: Shuffle;Decreased stride length;Step-through pattern Gait velocity: decreased   General Gait Details: short shuffle steps (wife reports does this normally); cued for posture and breathing technique  Stairs            Wheelchair Mobility    Modified Rankin (Stroke Patients Only)       Balance Overall balance assessment: Needs assistance Sitting-balance support: No upper extremity supported;Feet supported Sitting balance-Leahy Scale: Good     Standing balance support: Single extremity supported Standing balance-Leahy Scale: Fair                               Pertinent Vitals/Pain Pain Assessment: 0-10 Pain Score: 6  Pain Location: back - chronic Pain Descriptors / Indicators: Discomfort Pain Intervention(s): Premedicated before session;Monitored during session;Limited activity within patient's tolerance;Repositioned    Home Living Family/patient expects to be discharged to:: Private residence Living Arrangements: Spouse/significant other Available Help at Discharge: Family;Available 24 hours/day Type of Home: House Home Access: Stairs to enter Entrance Stairs-Rails: Right;Left;Can reach both Entrance Stairs-Number of Steps: 4 Home Layout: One level Home Equipment: Shower seat;Grab bars - tub/shower;Walker - 2 wheels;Cane - single point      Prior Function Level of Independence: Needs assistance   Gait / Transfers Assistance Needed: Ambulates with cane; can ambulate in community  ADL's / Homemaking Assistance Needed: Can do ADLs, IADLs (wife cooks), and drives  Comments: Has had 4-5 falls in 6 months due to LOB- reports mostly when out in yard; hx  of back pain     Hand Dominance        Extremity/Trunk Assessment   Upper Extremity Assessment Upper Extremity Assessment: Overall WFL for tasks assessed    Lower Extremity Assessment Lower Extremity Assessment:  Overall WFL for tasks assessed    Cervical / Trunk Assessment Cervical / Trunk Assessment: Normal  Communication   Communication: HOH  Cognition Arousal/Alertness: Awake/alert Behavior During Therapy: WFL for tasks assessed/performed Overall Cognitive Status: History of cognitive impairments - at baseline                                 General Comments: Able to follow commands ; A and O to all but date but could name president      General Comments General comments (skin integrity, edema, etc.): Pt on 6 LPM O2 with sats 95%; BP and HR stable; DOE of 3/4 with walking    Exercises     Assessment/Plan    PT Assessment Patient needs continued PT services  PT Problem List Decreased strength;Decreased mobility;Decreased safety awareness;Decreased coordination;Decreased activity tolerance;Cardiopulmonary status limiting activity;Decreased balance;Decreased knowledge of use of DME       PT Treatment Interventions DME instruction;Therapeutic activities;Gait training;Therapeutic exercise;Patient/family education;Stair training;Balance training;Functional mobility training    PT Goals (Current goals can be found in the Care Plan section)  Acute Rehab PT Goals Patient Stated Goal: return home PT Goal Formulation: With patient/family Time For Goal Achievement: 01/03/20 Potential to Achieve Goals: Good    Frequency Min 3X/week   Barriers to discharge        Co-evaluation               AM-PAC PT "6 Clicks" Mobility  Outcome Measure Help needed turning from your back to your side while in a flat bed without using bedrails?: A Little Help needed moving from lying on your back to sitting on the side of a flat bed without using bedrails?: A Little Help needed moving to and from a bed to a chair (including a wheelchair)?: A Little Help needed standing up from a chair using your arms (e.g., wheelchair or bedside chair)?: A Little Help needed to walk in hospital  room?: A Little Help needed climbing 3-5 steps with a railing? : A Little 6 Click Score: 18    End of Session Equipment Utilized During Treatment: Gait belt;Oxygen Activity Tolerance: Patient tolerated treatment well Patient left: in bed;with call bell/phone within reach;with family/visitor present(family sitting/leaning on bed so alarm not turned on (family and RN aware)) Nurse Communication: Mobility status PT Visit Diagnosis: Unsteadiness on feet (R26.81);History of falling (Z91.81)    Time: [id number] PT Time Calculation (min) (ACUTE ONLY): 30 min   Charges:   PT Evaluation $PT Eval Moderate Complexity: 1 Mod PT Treatments $Therapeutic Activity: 8-22 mins        Maggie Font, PT Acute Rehab Services Pager [phone] [facility] Re[facility] [phone] [facility] [phone]   Karlton Lemon 12/20/2019, 2:15 PM

## 2019-12-20 NOTE — Plan of Care (Signed)

## 2019-12-21 LAB — GLUCOSE, CAPILLARY
Glucose-Capillary: 126 mg/dL — ABNORMAL HIGH (ref 70–99)
Glucose-Capillary: 107 mg/dL — ABNORMAL HIGH (ref 70–99)
Glucose-Capillary: 174 mg/dL — ABNORMAL HIGH (ref 70–99)
Glucose-Capillary: 177 mg/dL — ABNORMAL HIGH (ref 70–99)
Glucose-Capillary: 162 mg/dL — ABNORMAL HIGH (ref 70–99)
Glucose-Capillary: 129 mg/dL — ABNORMAL HIGH (ref 70–99)

## 2019-12-21 LAB — IRON AND TIBC
Iron: 12 ug/dL — ABNORMAL LOW (ref 45–182)
Saturation Ratios: 6 % — ABNORMAL LOW (ref 17.9–39.5)
TIBC: 202 ug/dL — ABNORMAL LOW (ref 250–450)
UIBC: 190 ug/dL

## 2019-12-21 LAB — RETICULOCYTES
Immature Retic Fract: 9.1 % (ref 2.3–15.9)
RBC.: 3.13 MIL/uL — ABNORMAL LOW (ref 4.22–5.81)
Retic Count, Absolute: 51.6 10*3/uL (ref 19.0–186.0)
Retic Ct Pct: 1.7 % (ref 0.4–3.1)

## 2019-12-21 LAB — FOLATE: Folate: 11.2 ng/mL (ref >5.9)

## 2019-12-21 LAB — TSH: TSH: 1.216 u[IU]/mL (ref 0.350–4.500)

## 2019-12-21 LAB — VITAMIN B12: Vitamin B-12: 325 pg/mL (ref 180–914)

## 2019-12-21 LAB — FERRITIN: Ferritin: 374 ng/mL — ABNORMAL HIGH (ref 24–336)

## 2019-12-21 MED ORDER — LOPERAMIDE HCL 2 MG PO CAPS
2.0000 mg | ORAL_CAPSULE | Freq: Once | ORAL | Status: AC
  Administered 2019-12-24: 2 mg via ORAL
  Filled 2019-12-21 (×2): qty 1

## 2019-12-21 MED ORDER — SENNOSIDES-DOCUSATE SODIUM 8.6-50 MG PO TABS
1.0000 | ORAL_TABLET | Freq: Every day | ORAL | Status: DC
  Administered 2019-12-22 – 2019-12-24 (×4): 1 via ORAL
  Filled 2019-12-21 (×4): qty 1

## 2019-12-21 MED ORDER — POLYETHYLENE GLYCOL 3350 17 G PO PACK
17.0000 g | PACK | Freq: Every day | ORAL | Status: DC
  Administered 2019-12-23: 17 g via ORAL
  Filled 2019-12-21 (×2): qty 1

## 2019-12-21 MED ORDER — LORAZEPAM 0.5 MG PO TABS
0.5000 mg | ORAL_TABLET | Freq: Two times a day (BID) | ORAL | Status: DC | PRN
  Administered 2019-12-21 – 2019-12-25 (×6): 0.5 mg via ORAL
  Filled 2019-12-21 (×7): qty 1

## 2019-12-21 NOTE — Progress Notes (Signed)
[facility] PROGRESS NOTE   Arshad Lawrenz [facility] [id number] DOB: Nov 06, 1945 DOA: 12/19/2019  PCP: [name], NP  Brief History/Interval Summary: 74 y.o. male with PMH of Dementia, COPD/Centrilobular Emphysema, and Aortic Atherosclerosis who presented to ED with acute progressively worsening weakness and fatigue.  As per daughter, the night before admission patient went to the bathroom and sat down on toilet.  He felt very weak where he was unable to get up.  His family had to lift him from toilet and put him to sleep.  In the morning, patient still felt weak and could not get out of bed.  The family brought him to ED.  On evaluation, patient was noted to be hypoxic requiring 15L, mildly shortness of breath, fevers up to 103, mild cough.  [place] shows left sided infiltrates concerning for pneumonia.  Lactate is 3.1 mmol/L.  He has been vaccinated for COVID and COVID test is negative. Further history is limited as patient has dementia.   Reason for Visit: Community-acquired pneumonia, possible aspiration  Consultants: None  Procedures: None  Antibiotics: Anti-infectives (From admission, onward)   Start     Dose/Rate Route Frequency Ordered Stop   12/20/19 1200  piperacillin-tazobactam (ZOSYN) IVPB 3.375 g     3.375 g 12.5 mL/hr over 240 Minutes Intravenous Every 8 hours 12/20/19 1058     12/20/19 1130  azithromycin (ZITHROMAX) 500 mg in sodium chloride 0.9 % 250 mL IVPB     500 mg 250 mL/hr over 60 Minutes Intravenous Every 24 hours 12/20/19 1038 12/25/19 1129   12/20/19 0600  vancomycin (VANCOREADY) IVPB 750 mg/150 mL     750 mg 150 mL/hr over 60 Minutes Intravenous Every 12 hours 12/19/19 1645     12/20/19 0400  ceFEPIme (MAXIPIME) 2 g in sodium chloride 0.9 % 100 mL IVPB  Status:  Discontinued     2 g 200 mL/hr over 30 Minutes Intravenous Every 12 hours 12/19/19 1645 12/20/19 1038   12/19/19 1600  ceFEPIme (MAXIPIME) 2 g in sodium chloride 0.9 % 100 mL IVPB     2 g 200  mL/hr over 30 Minutes Intravenous  Once 12/19/19 1553 12/19/19 1642   12/19/19 1600  metroNIDAZOLE (FLAGYL) IVPB 500 mg     500 mg 100 mL/hr over 60 Minutes Intravenous  Once 12/19/19 1553 12/19/19 1754   12/19/19 1600  vancomycin (VANCOCIN) IVPB 1000 mg/200 mL premix  Status:  Discontinued     1,000 mg 200 mL/hr over 60 Minutes Intravenous  Once 12/19/19 1553 12/19/19 1554   12/19/19 1600  vancomycin (VANCOREADY) IVPB 1500 mg/300 mL     1,500 mg 150 mL/hr over 120 Minutes Intravenous  Once 12/19/19 1554 12/19/19 2013      Subjective/Interval History: Patient states that he is feeling better this morning.  His daughter is at the bedside.  He looks more comfortable.  Back pain is there but better than yesterday.  Shortness of breath is improving.  Has a cough with expectoration but cannot describe the sputum.    ROS: Denies any chest pain.  No nausea or vomiting.    Assessment/Plan:  Community-acquired pneumonia with concern for aspiration/acute respiratory failure with hypoxia/sepsis present on admission/septic shock Patient found to have bilateral pneumonia which is the reason for his hypoxia.  CT scan did not show any PE.  Patient was noted to have elevated lactic acid level.  There was concern for sepsis on admission.  Patient was started on broad-spectrum antibiotics.  We also obtained  a swallow evaluation.  He was thought to have mild aspiration risk.  Continued on regular consistency food with thin liquids. Currently on vancomycin Zosyn and azithromycin.  Follow-up on blood cultures.  Blood pressures have improved.  Stop IV fluids.   Still noted to be on 6 L of oxygen.  Discussed with nursing staff.  Will wean it down.  Dropped him down to 5 L with sats in the late 90s. PT and OT evaluation.  Will need home oxygen assessment before discharge.  Acute COPD exacerbation He was noted to be wheezing.  He has a history of COPD.  Prednisone has been ordered for 5 days..  Continue  nebulizer treatments.  Lung sounds better today.  Acute renal failure Baseline creatinine around 1.2.  Noted to be 1.6 at admission.  She was hydrated with improvement in renal function.  Labs not done this morning.  Stop IV fluids.  Continue to monitor urine output.  Check labs again tomorrow.    Essential hypertension His blood pressure medications were placed on hold due to sepsis.  Her blood pressures have been stable.    Chronic back pain/history of ankylosing spondylitis Resume his home medications including hydrocodone and Robaxin but at a lower dose.  He apparently was started on by his pain management specialist recently but patient did not tolerate it well so it is he is not taking that anymore.  Also on Neurontin which will be continued at a lower dose.  Add laxatives.  Diabetes mellitus type 2, uncontrolled with hyperglycemia CBGs are reasonably well controlled.  Continue SSI.  On Metformin at home which is held.  History of dementia On Aricept, Namenda and Depakote.  Behavior is stable.    History of BPH Continue Flomax.  Aortic atherosclerosis  Aspirin and statin.  Macrocytic anemia No drop in hemoglobin noted today.  No evidence of overt bleeding.  TSH is normal.  B12 is noted to be 325.  Low normal so we will start him on supplementation.  No clear evidence for iron deficiency.  Marland Kitchen    DVT Prophylaxis: Lovenox Code Status: CODE STATUS confirmed with the patient's family.  He is DNR Family Communication: Discussed with the patient's daughter and wife Disposition Plan:  Status is: Inpatient  Remains inpatient appropriate because:Hemodynamically unstable and IV treatments appropriate due to intensity of illness or inability to take PO.  Hypoxic   Dispo: The patient is from: Home              Anticipated d/c is to: Home              Anticipated d/c date is: Anticipate discharge June 3 or 4              Patient currently is not medically stable to  d/c.       Medications:  Scheduled: . aspirin  81 mg Oral Daily  . citalopram  20 mg Oral Daily  . divalproex  500 mg Oral BID  . donepezil  10 mg Oral q morning - 10a  . enoxaparin (LOVENOX) injection  40 mg Subcutaneous Q24H  . gabapentin  300 mg Oral QHS  . insulin aspart  0-9 Units Subcutaneous Q4H  . memantine  7 mg Oral q morning - 10a  . methocarbamol  500 mg Oral TID  . polyethylene glycol  17 g Oral Daily  . predniSONE  40 mg Oral Q breakfast  . saccharomyces boulardii  250 mg Oral BID  . senna-docusate  1  tablet Oral QHS  . simvastatin  40 mg Oral q1800  . tamsulosin  0.4 mg Oral q1800   Continuous: . azithromycin 500 mg (12/20/19 1320)  . lactated ringers 75 mL/hr at 12/20/19 1946  . piperacillin-tazobactam (ZOSYN)  IV 3.375 g (12/21/19 0414)  . vancomycin 750 mg (12/21/19 0610)   [id number] **OR** acetaminophen, albuterol, alum & mag hydroxide-simeth, HYDROcodone-acetaminophen, ipratropium-albuterol   Objective:  Vital Signs  Vitals:   12/20/19 2300 12/21/19 0357 12/21/19 0752 12/21/19 0800  BP: (!) 114/55 122/63 (!) 130/55 (!) 135/54  Pulse: 72 71 71 (!) 59  Resp: 19 (!) 23 18 18   Temp: (!) 97.2 F (36.2 C) 97.8 F (36.6 C) 98 F (36.7 C) (!) 97.5 F (36.4 C)  TempSrc: Oral Oral Oral Oral  SpO2: 91% 95%  96%  Weight:      Height:        Intake/Output Summary (Last 24 hours) at 12/21/2019 0908 Last data filed at 12/20/2019 1946 Gross per 24 hour  Intake 1412.29 ml  Output 300 ml  Net 1112.29 ml   Filed Weights   12/19/19 1548  Weight: 84.4 kg    General appearance: Awake alert.  In no distress Resp: Crackles heard bilateral bases.  Left greater than right.  Not noted to be tachypneic today.  No wheezing heard today.   Cardio: S1-S2 is normal regular.  No S3-S4.  No rubs murmurs or bruit GI: Abdomen is soft.  Nontender nondistended.  Bowel sounds are present normal.  No masses organomegaly Extremities: No edema.  Moving all his  extremities. Neurologic: No obvious focal neurological deficits..    Lab Results:  Data Reviewed: I have personally reviewed following labs and imaging studies  CBC: Recent Labs  Lab 12/19/19 1556 12/20/19 0348  WBC 6.8 9.9  NEUTROABS 6.1  --   HGB 11.6* 11.0*  HCT 36.1* 33.9*  MCV 104.0* 103.0*  PLT 142* 130*    Basic Metabolic Panel: Recent Labs  Lab 12/19/19 1556 12/20/19 0348  NA 137 137  K 4.4 4.3  CL 103 103  CO2 22 24  GLUCOSE 189* 149*  BUN 25* 26*  CREATININE 1.61* 1.30*  CALCIUM 7.8* 8.1*    GFR: Estimated Creatinine Clearance: 53.1 mL/min (A) (by C-G formula based on SCr of 1.3 mg/dL (H)).  Liver Function Tests: Recent Labs  Lab 12/19/19 1556  AST 29  ALT 12  ALKPHOS 25*  BILITOT 0.5  PROT 5.3*  ALBUMIN 2.6*     Coagulation Profile: Recent Labs  Lab 12/19/19 1556  INR 1.2     CBG: Recent Labs  Lab 12/20/19 1626 12/20/19 1928 12/20/19 2253 12/21/19 0352 12/21/19 0730  GLUCAP 168* 152* 125* 126* 107*      Recent Results (from the past 240 hour(s))  Blood Culture (routine x 2)     Status: None (Preliminary result)   Collection Time: 12/19/19  3:51 PM   Specimen: BLOOD  Result Value Ref Range Status   Specimen Description BLOOD BLOOD LEFT FOREARM  Final   Special Requests   Final    BOTTLES DRAWN AEROBIC AND ANAEROBIC Blood Culture adequate volume   Culture   Final    NO GROWTH < 24 HOURS Performed at [facility], [place] [address] [place], [place] [address]    Report Status PENDING  Incomplete  SARS Coronavirus 2 by RT PCR (hospital order, performed in [facility] hospital lab) Nasopharyngeal Nasopharyngeal Swab     Status: None   Collection  Time: 12/19/19  3:52 PM   Specimen: Nasopharyngeal Swab  Result Value Ref Range Status   SARS Coronavirus 2 NEGATIVE NEGATIVE Final    Comment: (NOTE) SARS-CoV-2 target nucleic acids are NOT DETECTED. The SARS-CoV-2 RNA is generally detectable in upper and lower respiratory  specimens during the acute phase of infection. The lowest concentration of SARS-CoV-2 viral copies this assay can detect is 250 copies / mL. A negative result does not preclude SARS-CoV-2 infection and should not be used as the sole basis for treatment or other patient management decisions.  A negative result may occur with improper specimen collection / handling, submission of specimen other than nasopharyngeal swab, presence of viral mutation(s) within the areas targeted by this assay, and inadequate number of viral copies (<250 copies / mL). A negative result must be combined with clinical observations, patient history, and epidemiological information. Fact Sheet for Patients:   StrictlyIdeas.no Fact Sheet for Healthcare Providers: BankingDealers.co.za This test is not yet approved or cleared  by the [place] FDA and has been authorized for detection and/or diagnosis of SARS-CoV-2 by FDA under an Emergency Use Authorization (EUA).  This EUA will remain in effect (meaning this test can be used) for the duration of the COVID-19 declaration under Section 564(b)(1) of the Act, 21 U.S.C. section 360bbb-3(b)(1), unless the authorization is terminated or revoked sooner. Performed at [facility], [place] [address] [place], [place] [address]   Blood Culture (routine x 2)     Status: None (Preliminary result)   Collection Time: 12/19/19  4:09 PM   Specimen: BLOOD  Result Value Ref Range Status   Specimen Description BLOOD RIGHT ANTECUBITAL  Final   Special Requests   Final    BOTTLES DRAWN AEROBIC AND ANAEROBIC Blood Culture adequate volume   Culture   Final    NO GROWTH < 24 HOURS Performed at [facility], [place] [address], [place], [place] [address]    Report Status PENDING  Incomplete  Urine culture     Status: None   Collection Time: 12/19/19  5:59 PM   Specimen: In/Out Cath Urine  Result Value Ref Range Status    Specimen Description IN/OUT CATH URINE  Final   Special Requests NONE  Final   Culture   Final    NO GROWTH Performed at [facility], [place] [address] [place], [place] [address]    Report Status 12/20/2019 FINAL  Final      Radiology Studies: CT ANGIO CHEST PE W OR WO CONTRAST  Result Date: 12/19/2019 CLINICAL DATA:  74 year old male with concern for pulmonary embolism. EXAM: CT ANGIOGRAPHY CHEST WITH CONTRAST TECHNIQUE: Multidetector CT imaging of the chest was performed using the standard protocol during bolus administration of intravenous contrast. Multiplanar CT image reconstructions and MIPs were obtained to evaluate the vascular anatomy. CONTRAST:  25mL OMNIPAQUE IOHEXOL 350 MG/ML SOLN COMPARISON:  Chest radiograph dated 12/19/2019. FINDINGS: Cardiovascular: There is no cardiomegaly or pericardial effusion. Coronary vascular calcification primarily involving the LAD and RCA. There is mild atherosclerotic calcification of the thoracic aorta. Evaluation of the pulmonary arteries is limited due to respiratory motion artifact. No large or central pulmonary artery embolus identified. Mediastinum/Nodes: No hilar or mediastinal adenopathy. The esophagus is grossly unremarkable. No mediastinal fluid collection. Lungs/Pleura: There is a large area of consolidative change in the left lower lobe most concerning for pneumonia. Clinical correlation and follow-up to resolution recommended. Smaller area of consolidation involving the right lung base noted. There is  background of emphysema. Patchy areas of interstitial coarsening/density involving the upper lobes may represent progression of disease or pneumonia. No large pleural effusion or pneumothorax. The central airways are patent. Upper Abdomen: Fatty liver. Musculoskeletal: Degenerative changes of the spine. No acute osseous pathology. Findings of ankylosing spondylitis. Spinal stimulator wire over the midthoracic spine. Review of the MIP images  confirms the above findings. IMPRESSION: 1. No CT evidence of central pulmonary artery embolus. 2. Bilateral lower lobe pneumonia, left greater right. Clinical correlation and follow-up to resolution recommended. 3. Aortic Atherosclerosis (ICD10-I70.0) and Emphysema (ICD10-J43.9). Electronically Signed   By: Anner Crete M.D.   On: 12/19/2019 19:31   DG Chest Port 1 View  Result Date: 12/19/2019 CLINICAL DATA:  Altered mental status with fever. EXAM: PORTABLE CHEST 1 VIEW COMPARISON:  04/27/2019 and prior radiographs FINDINGS: LEFT LOWER lung airspace disease/consolidation noted worrisome for pneumonia. No definite pleural effusion or pneumothorax. Cardiomediastinal silhouette is unremarkable. Thoracic cord stimulator again noted. IMPRESSION: LEFT LOWER lung airspace disease/consolidation worrisome for pneumonia. Radiographic follow-up to resolution recommended. Electronically Signed   By: Margarette Canada M.D.   On: 12/19/2019 16:35       LOS: 2 days   [facility] Pager on [email or web address]  12/21/2019, 9:08 AM

## 2019-12-21 NOTE — Evaluation (Signed)
Occupational Therapy Evaluation Patient Details Name: Alexander Booth MRN: [id number] DOB: [birth year] Today's Date: 12/21/2019    History of Present Illness Pt is 74 y.o. male with PMH of Dementia, COPD/Centrilobular Emphysema, and Aortic Atherosclerosis who presented to ED with acute progressively worsening weakness and fatigue.   Pt found to have PNE and requiring 15 L O2 initially.   Clinical Impression   This 74 y/o male presents with the above. PTA pt reports living with spouse, typically performs basic ADL and mobility tasks independently. Pt currently presenting with generalized weakness, decreased activity tolerance, and impaired balance impacting his functional performance. Pt currently performing ADL and mobility tasks mostly at minguard assist level; pt intermittently requiring minA for balance with toileting and prolonged mobility tasks. Pt on 6L O2 with activity with SpO2 >/=89% throughout. Pt's daughter present and supportive during session, confirms pt will have 24hr family assist at time of discharge. Pt will benefit from continued acute OT services and currently recommend follow up [facility] services after discharge to maximize his safety and independence with ADL and mobility.    Follow Up Recommendations  Home health OT;Supervision/Assistance - 24 hour    Equipment Recommendations  None recommended by OT           Precautions / Restrictions Precautions Precautions: Fall Precaution Comments: monitor O2 Restrictions Weight Bearing Restrictions: No      Mobility Bed Mobility Overal bed mobility: Needs Assistance Bed Mobility: Supine to Sit;Sit to Supine     Supine to sit: Supervision;HOB elevated Sit to supine: Supervision;HOB elevated   General bed mobility comments: supervision for lines  Transfers Overall transfer level: Needs assistance Equipment used: Rolling walker (2 wheeled);None Transfers: Sit to/from Stand Sit to Stand: Min guard         General  transfer comment: for lines and safety, cues to wait for therapist prior to standing to ensure lines/leads in place    Balance Overall balance assessment: Needs assistance Sitting-balance support: No upper extremity supported;Feet supported Sitting balance-Leahy Scale: Good     Standing balance support: Bilateral upper extremity supported;During functional activity Standing balance-Leahy Scale: Fair                             ADL either performed or assessed with clinical judgement   ADL Overall ADL's : Needs assistance/impaired Eating/Feeding: Set up;Sitting   Grooming: Wash/dry face;Min guard;Standing Grooming Details (indicate cue type and reason): close minguard for standing balance Upper Body Bathing: Set up;Supervision/ safety;Sitting   Lower Body Bathing: Min guard;Minimal assistance;Sit to/from stand   Upper Body Dressing : Supervision/safety;Set up;Sitting   Lower Body Dressing: Min guard;Minimal assistance;Sit to/from stand Lower Body Dressing Details (indicate cue type and reason): intermittent minA for standing balance, pt able to don his socks seated EOB given increased time/effort Toilet Transfer: Min guard;Ambulation;RW;Grab bars   Toileting- Clothing Manipulation and Hygiene: Minimal assistance;Sitting/lateral lean;Sit to/from stand Toileting - Clothing Manipulation Details (indicate cue type and reason): pt performing posterior pericare; intermittent minA for balance      Functional mobility during ADLs: Min guard;Rolling walker(mostly minguard - occasional minA for balance) General ADL Comments: pt with decreased activity tolerance and WOB with activity      Vision         Perception     Praxis      Pertinent Vitals/Pain Pain Assessment: Faces Faces Pain Scale: Hurts little more Pain Location: back - chronic Pain Descriptors / Indicators: Discomfort Pain Intervention(s):  Monitored during session;Repositioned     Hand Dominance      Extremity/Trunk Assessment Upper Extremity Assessment Upper Extremity Assessment: Overall WFL for tasks assessed   Lower Extremity Assessment Lower Extremity Assessment: Defer to PT evaluation   Cervical / Trunk Assessment Cervical / Trunk Assessment: Normal   Communication Communication Communication: HOH   Cognition Arousal/Alertness: Awake/alert Behavior During Therapy: WFL for tasks assessed/performed Overall Cognitive Status: History of cognitive impairments - at baseline                                 General Comments: hx of dementia, following basic commands, cues for self-monitoring   General Comments  SpO2 >/=89% on 6L O2; DOE 2/4 with activity today    Exercises     Shoulder Instructions      Home Living Family/patient expects to be discharged to:: Private residence Living Arrangements: Spouse/significant other Available Help at Discharge: Family;Available 24 hours/day Type of Home: House Home Access: Stairs to enter [employer] of Steps: 4 Entrance Stairs-Rails: Right;Left;Can reach both Home Layout: One level     Bathroom Shower/Tub: Tub/shower unit;Walk-in shower   Bathroom Toilet: Standard     Home Equipment: Shower seat;Grab bars - tub/shower;Walker - 2 wheels;Cane - single point          Prior Functioning/Environment Level of Independence: Needs assistance  Gait / Transfers Assistance Needed: Ambulates with cane; can ambulate in community ADL's / Homemaking Assistance Needed: Can do ADLs, IADLs (wife cooks), and drives   Comments: Has had 4-5 falls in 6 months due to LOB- reports mostly when out in yard; hx of back pain        OT Problem List: Decreased strength;Decreased range of motion;Decreased activity tolerance;Impaired balance (sitting and/or standing);Cardiopulmonary status limiting activity      OT Treatment/Interventions: Self-care/ADL training;Therapeutic exercise;DME and/or AE instruction;Manual  therapy;Therapeutic activities;Patient/family education;Balance training;Energy conservation    OT Goals(Current goals can be found in the care plan section) Acute Rehab OT Goals Patient Stated Goal: return home OT Goal Formulation: With patient Time For Goal Achievement: 01/04/20 Potential to Achieve Goals: Good  OT Frequency: Min 2X/week   Barriers to D/C:            Co-evaluation              AM-PAC OT "6 Clicks" Daily Activity     Outcome Measure Help from another person eating meals?: A Little Help from another person taking care of personal grooming?: A Little Help from another person toileting, which includes using toliet, bedpan, or urinal?: A Little Help from another person bathing (including washing, rinsing, drying)?: A Little Help from another person to put on and taking off regular upper body clothing?: A Little Help from another person to put on and taking off regular lower body clothing?: A Little 6 Click Score: 18   End of Session Equipment Utilized During Treatment: Gait belt;Rolling walker;Oxygen Nurse Communication: Mobility status  Activity Tolerance: Patient tolerated treatment well Patient left: in bed;with call bell/phone within reach;with bed alarm set;with family/visitor present  OT Visit Diagnosis: Muscle weakness (generalized) (M62.81);Unsteadiness on feet (R26.81)                Time: [id number] OT Time Calculation (min): 36 min Charges:  OT General Charges $OT Visit: 1 Visit OT Evaluation $OT Eval Moderate Complexity: 1 Mod OT Treatments $Self Care/Home Management : 8-22 mins  Lou Cal, OT Acute Rehabilitation  Services Pager [phone] Office [phone]  Raymondo Band 12/21/2019, 10:35 AM

## 2019-12-22 ENCOUNTER — Inpatient Hospital Stay (HOSPITAL_COMMUNITY): Payer: Medicare HMO

## 2019-12-22 DIAGNOSIS — J160 Chlamydial pneumonia: Secondary | ICD-10-CM

## 2019-12-22 LAB — MRSA PCR SCREENING: MRSA by PCR: NEGATIVE

## 2019-12-22 LAB — GLUCOSE, CAPILLARY
Glucose-Capillary: 108 mg/dL — ABNORMAL HIGH (ref 70–99)
Glucose-Capillary: 121 mg/dL — ABNORMAL HIGH (ref 70–99)
Glucose-Capillary: 175 mg/dL — ABNORMAL HIGH (ref 70–99)
Glucose-Capillary: 204 mg/dL — ABNORMAL HIGH (ref 70–99)
Glucose-Capillary: 187 mg/dL — ABNORMAL HIGH (ref 70–99)
Glucose-Capillary: 144 mg/dL — ABNORMAL HIGH (ref 70–99)

## 2019-12-22 LAB — BASIC METABOLIC PANEL
Anion gap: 9 (ref 5–15)
BUN: 18 mg/dL (ref 8–23)
CO2: 24 mmol/L (ref 22–32)
Calcium: 8.2 mg/dL — ABNORMAL LOW (ref 8.9–10.3)
Chloride: 105 mmol/L (ref 98–111)
Creatinine, Ser: 0.93 mg/dL (ref 0.61–1.24)
GFR calc Af Amer: >60 mL/min (ref >60)
GFR calc non Af Amer: >60 mL/min (ref >60)
Glucose, Bld: 119 mg/dL — ABNORMAL HIGH (ref 70–99)
Potassium: 3.5 mmol/L (ref 3.5–5.1)
Sodium: 138 mmol/L (ref 135–145)

## 2019-12-22 LAB — CBC
HCT: 31.4 % — ABNORMAL LOW (ref 39.0–52.0)
Hemoglobin: 10.4 g/dL — ABNORMAL LOW (ref 13.0–17.0)
MCH: 33.4 pg (ref 26.0–34.0)
MCHC: 33.1 g/dL (ref 30.0–36.0)
MCV: 101 fL — ABNORMAL HIGH (ref 80.0–100.0)
Platelets: 114 10*3/uL — ABNORMAL LOW (ref 150–400)
RBC: 3.11 MIL/uL — ABNORMAL LOW (ref 4.22–5.81)
RDW: 13.3 % (ref 11.5–15.5)
WBC: 8.3 10*3/uL (ref 4.0–10.5)
nRBC: 0 % (ref 0.0–0.2)

## 2019-12-22 MED ORDER — ALBUTEROL SULFATE (2.5 MG/3ML) 0.083% IN NEBU
2.5000 mg | INHALATION_SOLUTION | RESPIRATORY_TRACT | Status: DC | PRN
  Administered 2019-12-23: 2.5 mg via RESPIRATORY_TRACT
  Filled 2019-12-22 (×2): qty 3

## 2019-12-22 MED ORDER — ALBUTEROL SULFATE (2.5 MG/3ML) 0.083% IN NEBU
INHALATION_SOLUTION | RESPIRATORY_TRACT | Status: AC
  Administered 2019-12-22: 2.5 mg via RESPIRATORY_TRACT
  Filled 2019-12-22: qty 3

## 2019-12-22 NOTE — Plan of Care (Signed)

## 2019-12-22 NOTE — Progress Notes (Signed)
PROGRESS NOTE    Alexander Booth [facility]  [id number] DOB: 09-23-1945 DOA: 12/19/2019 PCP: [name], NP   Chief Complaint  Patient presents with  . Altered Mental Status    Brief Narrative:  74 y.o.malewithPMH of Dementia, COPD/Centrilobular Emphysema, and Aortic Atherosclerosiswho presented to ED with acute progressively worsening weakness and fatigue. As per daughter, the night before admission patient went to the bathroom and sat down on toilet. He felt very weak where he was unable to get up. His family had to lift him from toilet and put him to sleep. In the morning, patient still felt weak and could not get out of bed. The family brought him to ED. On evaluation, patient was noted to be hypoxic requiring 15L, mildlyshortness of breath, fevers up to 103, mild cough. [place] shows left sided infiltrates concerning for pneumonia. Lactate is 3.1 mmol/L. He has been vaccinated for COVID and COVID test is negative.Further history is limited as patient has dementia.  Assessment & Plan:   Active Problems:   Pneumonia  Community-acquired pneumonia with concern for aspiration/acute respiratory failure with hypoxia/sepsis present on admission/septic shock Patient found to have bilateral pneumonia which is the reason for his hypoxia.  CT scan did not show any PE.  Patient was noted to have elevated lactic acid level.  There was concern for sepsis on admission.   CXR 6/2 with progression of L>R pneumonia Vancomycin (5/30 - present), cefepime/flagyl (5/30), zosyn (5/31 - present), azithromycin  (5/31 - present) SLP eval -> mild aspiration risk, recommending regular/thin liquids MRSA pcr pending Sputum cx pending collection Blood cx NGTD x3, urine cx no growth Required venti mask last night for desat -> on 7 L at bedside today -> weaned further to 4 L this afternoon Will need evaluation for oxygen prior to d/c   Acute COPD exacerbation He was noted to be wheezing.  He has Exa Bomba  history of COPD.  Prednisone has been ordered for 5 days..  Continue nebulizer treatments.  No wheezing appreciated on my exam today.  Acute renal failure Baseline creatinine around 1.2.  Noted to be 1.6 at admission.  Creatinine improved with IVF.  Essential hypertension His blood pressure medications were placed on hold due to sepsis.  Consider resumption if stable in next 24 hrs.  Chronic back pain/history of ankylosing spondylitis Resume his home medications including hydrocodone and Robaxin but at Eliana Lueth lower dose.  He apparently was started on by his pain management specialist recently but patient did not tolerate it well so it is he is not taking that anymore.  Also on Neurontin which will be continued at Kaitlynne Wenz lower dose.  Add laxatives.  Has buprenorphine on med list, will need to discuss with pt.  Diabetes mellitus type 2, uncontrolled with hyperglycemia CBGs are reasonably well controlled.  Continue SSI.  On Metformin at home which is held.  History of dementia On Aricept, Namenda and Depakote.  Behavior is stable.    History of BPH Continue Flomax.  Aortic atherosclerosis  Aspirin and statin.  Macrocytic anemia No drop in hemoglobin noted today.  No evidence of overt bleeding.  TSH is normal.  B12 is noted to be 325.  Low normal so we will start him on supplementation.  No clear evidence for iron deficiency.  .  Thrombocytopenia: continue to monitor, mild  DVT prophylaxis: lovenox Code Status: DNR Family Communication: wife/daughter at bedside Disposition:   Status is: Inpatient  Remains inpatient appropriate because:Inpatient level of care appropriate due to severity  of illness   Dispo: The patient is from: Home              Anticipated d/c is to: Home              Anticipated d/c date is: 3 days              Patient currently is not medically stable to d/c.        Consultants:   none  Procedures:   none  Antimicrobials:  Anti-infectives (From  admission, onward)   Start     Dose/Rate Route Frequency Ordered Stop   12/20/19 1200  piperacillin-tazobactam (ZOSYN) IVPB 3.375 g     3.375 g 12.5 mL/hr over 240 Minutes Intravenous Every 8 hours 12/20/19 1058     12/20/19 1130  azithromycin (ZITHROMAX) 500 mg in sodium chloride 0.9 % 250 mL IVPB     500 mg 250 mL/hr over 60 Minutes Intravenous Every 24 hours 12/20/19 1038 12/25/19 1129   12/20/19 0600  vancomycin (VANCOREADY) IVPB 750 mg/150 mL  Status:  Discontinued     750 mg 150 mL/hr over 60 Minutes Intravenous Every 12 hours 12/19/19 1645 12/22/19 1024   12/20/19 0400  ceFEPIme (MAXIPIME) 2 g in sodium chloride 0.9 % 100 mL IVPB  Status:  Discontinued     2 g 200 mL/hr over 30 Minutes Intravenous Every 12 hours 12/19/19 1645 12/20/19 1038   12/19/19 1600  ceFEPIme (MAXIPIME) 2 g in sodium chloride 0.9 % 100 mL IVPB     2 g 200 mL/hr over 30 Minutes Intravenous  Once 12/19/19 1553 12/19/19 1642   12/19/19 1600  metroNIDAZOLE (FLAGYL) IVPB 500 mg     500 mg 100 mL/hr over 60 Minutes Intravenous  Once 12/19/19 1553 12/19/19 1754   12/19/19 1600  vancomycin (VANCOCIN) IVPB 1000 mg/200 mL premix  Status:  Discontinued     1,000 mg 200 mL/hr over 60 Minutes Intravenous  Once 12/19/19 1553 12/19/19 1554   12/19/19 1600  vancomycin (VANCOREADY) IVPB 1500 mg/300 mL     1,500 mg 150 mL/hr over 120 Minutes Intravenous  Once 12/19/19 1554 12/19/19 2013     Subjective: Feeling better No new complaints  Objective: Vitals:   12/22/19 1147 12/22/19 1224 12/22/19 1507 12/22/19 1647  BP:  (!) 165/59  (!) 154/70  Pulse: 75 84 69 73  Resp: 17 20 17 19   Temp:  98.5 F (36.9 C)  98.4 F (36.9 C)  TempSrc:  Oral  Oral  SpO2: 92% 92% 91%   Weight:      Height:        Intake/Output Summary (Last 24 hours) at 12/22/2019 1844 Last data filed at 12/22/2019 [id number] Gross per 24 hour  Intake 1357.31 ml  Output 100 ml  Net 1257.31 ml   Filed Weights   12/19/19 1548  Weight: 84.4 kg     Examination:  General exam: Appears calm and comfortable  Respiratory system: crackles at bases, L>R Cardiovascular system: S1 & S2 heard, RRR.  Gastrointestinal system: Abdomen is nondistended, soft and nontender. Central nervous system: Alert and oriented. No focal neurological deficits. Extremities: no LEE Skin: No rashes, lesions or ulcers Psychiatry: Judgement and insight appear normal. Mood & affect appropriate.     Data Reviewed: I have personally reviewed following labs and imaging studies  CBC: Recent Labs  Lab 12/19/19 1556 12/20/19 0348 12/22/19 0618  WBC 6.8 9.9 8.3  NEUTROABS 6.1  --   --   HGB  11.6* 11.0* 10.4*  HCT 36.1* 33.9* 31.4*  MCV 104.0* 103.0* 101.0*  PLT 142* 130* 114*    Basic Metabolic Panel: Recent Labs  Lab 12/19/19 1556 12/20/19 0348 12/22/19 0618  NA 137 137 138  K 4.4 4.3 3.5  CL 103 103 105  CO2 22 24 24   GLUCOSE 189* 149* 119*  BUN 25* 26* 18  CREATININE 1.61* 1.30* 0.93  CALCIUM 7.8* 8.1* 8.2*    GFR: Estimated Creatinine Clearance: 74.2 mL/min (by C-G formula based on SCr of 0.93 mg/dL).  Liver Function Tests: Recent Labs  Lab 12/19/19 1556  AST 29  ALT 12  ALKPHOS 25*  BILITOT 0.5  PROT 5.3*  ALBUMIN 2.6*    CBG: Recent Labs  Lab 12/21/19 2308 12/22/19 0326 12/22/19 0715 12/22/19 1221 12/22/19 1544  GLUCAP 129* 108* 121* 175* 204*     Recent Results (from the past 240 hour(s))  Blood Culture (routine x 2)     Status: None (Preliminary result)   Collection Time: 12/19/19  3:51 PM   Specimen: BLOOD  Result Value Ref Range Status   Specimen Description BLOOD BLOOD LEFT FOREARM  Final   Special Requests   Final    BOTTLES DRAWN AEROBIC AND ANAEROBIC Blood Culture adequate volume   Culture   Final    NO GROWTH 3 DAYS Performed at [facility], [place] [address], [place], [place] [address]    Report Status PENDING  Incomplete  SARS Coronavirus 2 by RT PCR (hospital order, performed in [facility] hospital lab) Nasopharyngeal Nasopharyngeal Swab     Status: None   Collection Time: 12/19/19  3:52 PM   Specimen: Nasopharyngeal Swab  Result Value Ref Range Status   SARS Coronavirus 2 NEGATIVE NEGATIVE Final    Comment: (NOTE) SARS-CoV-2 target nucleic acids are NOT DETECTED. The SARS-CoV-2 RNA is generally detectable in upper and lower respiratory specimens during the acute phase of infection. The lowest concentration of SARS-CoV-2 viral copies this assay can detect is 250 copies / mL. Brian Zeitlin negative result does not preclude SARS-CoV-2 infection and should not be used as the sole basis for treatment or other patient management decisions.  Nechemia Chiappetta negative result may occur with improper specimen collection / handling, submission of specimen other than nasopharyngeal swab, presence of viral mutation(s) within the areas targeted by this assay, and inadequate number of viral copies (<250 copies / mL). Raylynne Cubbage negative result must be combined with clinical observations, patient history, and epidemiological information. Fact Sheet for Patients:   StrictlyIdeas.no Fact Sheet for Healthcare Providers: BankingDealers.co.za This test is not yet approved or cleared  by the [place] FDA and has been authorized for detection and/or diagnosis of SARS-CoV-2 by FDA under an Emergency Use Authorization (EUA).  This EUA will remain in effect (meaning this test can be used) for the duration of the COVID-19 declaration under Section 564(b)(1) of the Act, 21 U.S.C. section 360bbb-3(b)(1), unless the authorization is terminated or revoked sooner. Performed at [facility], [place] [address], [name], [place] [address]   Blood Culture (routine x 2)     Status: None (Preliminary result)   Collection Time: 12/19/19  4:09 PM   Specimen: BLOOD  Result Value Ref Range Status   Specimen Description BLOOD RIGHT ANTECUBITAL  Final   Special Requests   Final     BOTTLES DRAWN AEROBIC AND ANAEROBIC Blood Culture adequate volume   Culture   Final    NO GROWTH 3 DAYS Performed at  [facility], [place] [address] [place], [place] [address]    Report Status PENDING  Incomplete  Urine culture     Status: None   Collection Time: 12/19/19  5:59 PM   Specimen: In/Out Cath Urine  Result Value Ref Range Status   Specimen Description IN/OUT CATH URINE  Final   Special Requests NONE  Final   Culture   Final    NO GROWTH Performed at [facility], [place] [address] [place], [place] [address]    Report Status 12/20/2019 FINAL  Final         Radiology Studies: DG CHEST PORT 1 VIEW  Result Date: 12/22/2019 CLINICAL DATA:  Shortness of breath EXAM: PORTABLE CHEST 1 VIEW COMPARISON:  12/19/2019 FINDINGS: Reticular opacity on the left more than right with pneumonia by recent CT. The opacification has increased. Small left pleural effusion. No visible pneumothorax. Borderline heart size. IMPRESSION: Progression of left more than right pneumonia with probable small left pleural effusion. Electronically Signed   By: Monte Fantasia M.D.   On: 12/22/2019 11:10        Scheduled Meds: . aspirin  81 mg Oral Daily  . citalopram  20 mg Oral Daily  . divalproex  500 mg Oral BID  . donepezil  10 mg Oral q morning - 10a  . enoxaparin (LOVENOX) injection  40 mg Subcutaneous Q24H  . gabapentin  300 mg Oral QHS  . insulin aspart  0-9 Units Subcutaneous Q4H  . loperamide  2 mg Oral Once  . memantine  7 mg Oral q morning - 10a  . methocarbamol  500 mg Oral TID  . polyethylene glycol  17 g Oral Daily  . predniSONE  40 mg Oral Q breakfast  . saccharomyces boulardii  250 mg Oral BID  . senna-docusate  1 tablet Oral QHS  . simvastatin  40 mg Oral q1800  . tamsulosin  0.4 mg Oral q1800   Continuous Infusions: . azithromycin 500 mg (12/22/19 0939)  . piperacillin-tazobactam (ZOSYN)  IV 3.375 g (12/22/19 1234)     LOS: 3 days    Time spent: over 107  min    Fayrene Helper, MD [facility]   To contact the attending provider between 7A-7P or the covering provider during after hours 7P-7A, please log into the web site [email or web address] and access using universal [place] password for that web site. If you do not have the password, please call the hospital operator.  12/22/2019, 6:44 PM

## 2019-12-22 NOTE — Progress Notes (Signed)
RT called to pts room for desatting and SOB. On assessment pt dyspneic and sats in upper 80's. Pt BBS clear diminished. RT placed pt on VM 10l/45%. Pt states he feels much better now. RT will continue to monitor.

## 2019-12-22 NOTE — Progress Notes (Signed)
Physical Therapy Treatment Patient Details Name: Alexander Booth MRN: [id number] DOB: [birth year] Today's Date: 12/22/2019    History of Present Illness Pt is 74 y.o. male with PMH of Dementia, COPD/Centrilobular Emphysema, and Aortic Atherosclerosis who presented to ED with acute progressively worsening weakness and fatigue.   Pt found to have PNE and requiring 15 L O2 initially.    PT Comments    Pt presented with increased fatigue and increased O2 need with mobility today.  Required frequent cues for safety and rest breaks.  Cont to progress as able.    Follow Up Recommendations  Home health PT;Supervision/Assistance - 24 hour     Equipment Recommendations  None recommended by PT    Recommendations for Other Services       Precautions / Restrictions Precautions Precautions: Fall Precaution Comments: monitor O2    Mobility  Bed Mobility Overal bed mobility: Needs Assistance Bed Mobility: Supine to Sit;Sit to Supine     Supine to sit: Supervision;HOB elevated Sit to supine: Supervision;HOB elevated   General bed mobility comments: supervision for lines  Transfers Overall transfer level: Needs assistance Equipment used: Rolling walker (2 wheeled) Transfers: Sit to/from Stand Sit to Stand: Min guard;Min assist         General transfer comment: Sit to stand x 6 throughout session; cues for lines/leads and for waiting for therapist to encure lines/leads in place, cues for safe hand placement, required min A from low toielt but min guard from chair and bed  Ambulation/Gait Ambulation/Gait assistance: Min guard Gait Distance (Feet): 75 Feet(75' then 10'x2) Assistive device: Rolling walker (2 wheeled) Gait Pattern/deviations: Shuffle;Decreased stride length;Step-through pattern Gait velocity: decreased   General Gait Details: short shuffle steps (wife reports does this normally); cued for posture and breathing technique; required 5 standing rest breaks during 75' and  then seated rest break on last 2 sets of10' to bathroom and back; fatigued easily   Stairs             Wheelchair Mobility    Modified Rankin (Stroke Patients Only)       Balance Overall balance assessment: Needs assistance Sitting-balance support: No upper extremity supported;Feet supported Sitting balance-Leahy Scale: Good       Standing balance-Leahy Scale: Fair                              Cognition Arousal/Alertness: Awake/alert Behavior During Therapy: WFL for tasks assessed/performed Overall Cognitive Status: History of cognitive impairments - at baseline                                        Exercises      General Comments General comments (skin integrity, edema, etc.): Pt on 5 LPM O2 at rest with sats 91%; On 6 LPM for ambulation with sats initially maintaining 89-90% with cues for breathing.  During ambulation to/from bathroom sats down to 85% and requiring 8 LPM to maintain 90%.  Able to decrease back to 5 LPM once at rest.  Pt required increased time due to pt deciding to change positions after lines/leads were settled back in place (to go to bathroom , then from chair to bed).      Pertinent Vitals/Pain Pain Assessment: No/denies pain    Home Living  Prior Function            PT Goals (current goals can now be found in the care plan section) Progress towards PT goals: Progressing toward goals    Frequency    Min 3X/week      PT Plan Current plan remains appropriate    Co-evaluation              AM-PAC PT "6 Clicks" Mobility   Outcome Measure  Help needed turning from your back to your side while in a flat bed without using bedrails?: A Little Help needed moving from lying on your back to sitting on the side of a flat bed without using bedrails?: A Little Help needed moving to and from a bed to a chair (including a wheelchair)?: A Little Help needed standing up from a  chair using your arms (e.g., wheelchair or bedside chair)?: A Little Help needed to walk in hospital room?: A Little Help needed climbing 3-5 steps with a railing? : A Little 6 Click Score: 18    End of Session Equipment Utilized During Treatment: Gait belt;Oxygen Activity Tolerance: Patient limited by fatigue Patient left: in bed;with call bell/phone within reach;with family/visitor present;with bed alarm set Nurse Communication: Mobility status PT Visit Diagnosis: Unsteadiness on feet (R26.81);History of falling (Z91.81)     Time: [id number] PT Time Calculation (min) (ACUTE ONLY): 40 min  Charges:  $Gait Training: 8-22 mins $Therapeutic Activity: 23-37 mins                     Maggie Font, PT Acute Rehab Services Pager [phone] [facility] [phone] [facility] [phone]    Karlton Lemon 12/22/2019, 2:44 PM

## 2019-12-23 ENCOUNTER — Inpatient Hospital Stay (HOSPITAL_COMMUNITY): Payer: Medicare HMO

## 2019-12-23 DIAGNOSIS — A419 Sepsis, unspecified organism: Principal | ICD-10-CM

## 2019-12-23 LAB — CBC WITH DIFFERENTIAL/PLATELET
Abs Immature Granulocytes: 0.06 10*3/uL (ref 0.00–0.07)
Basophils Absolute: 0 10*3/uL (ref 0.0–0.1)
Basophils Relative: 0 %
Eosinophils Absolute: 0 10*3/uL (ref 0.0–0.5)
Eosinophils Relative: 0 %
HCT: 31.7 % — ABNORMAL LOW (ref 39.0–52.0)
Hemoglobin: 10.4 g/dL — ABNORMAL LOW (ref 13.0–17.0)
Immature Granulocytes: 1 %
Lymphocytes Relative: 12 %
Lymphs Abs: 0.9 10*3/uL (ref 0.7–4.0)
MCH: 32.8 pg (ref 26.0–34.0)
MCHC: 32.8 g/dL (ref 30.0–36.0)
MCV: 100 fL (ref 80.0–100.0)
Monocytes Absolute: 0.5 10*3/uL (ref 0.1–1.0)
Monocytes Relative: 7 %
Neutro Abs: 5.7 10*3/uL (ref 1.7–7.7)
Neutrophils Relative %: 80 %
Platelets: 125 10*3/uL — ABNORMAL LOW (ref 150–400)
RBC: 3.17 MIL/uL — ABNORMAL LOW (ref 4.22–5.81)
RDW: 13.5 % (ref 11.5–15.5)
WBC: 7.1 10*3/uL (ref 4.0–10.5)
nRBC: 0 % (ref 0.0–0.2)

## 2019-12-23 LAB — COMPREHENSIVE METABOLIC PANEL
ALT: 37 U/L (ref 0–44)
AST: 35 U/L (ref 15–41)
Albumin: 2.4 g/dL — ABNORMAL LOW (ref 3.5–5.0)
Alkaline Phosphatase: 37 U/L — ABNORMAL LOW (ref 38–126)
Anion gap: 10 (ref 5–15)
BUN: 14 mg/dL (ref 8–23)
CO2: 26 mmol/L (ref 22–32)
Calcium: 8.4 mg/dL — ABNORMAL LOW (ref 8.9–10.3)
Chloride: 103 mmol/L (ref 98–111)
Creatinine, Ser: 0.92 mg/dL (ref 0.61–1.24)
GFR calc Af Amer: >60 mL/min (ref >60)
GFR calc non Af Amer: >60 mL/min (ref >60)
Glucose, Bld: 112 mg/dL — ABNORMAL HIGH (ref 70–99)
Potassium: 3.1 mmol/L — ABNORMAL LOW (ref 3.5–5.1)
Sodium: 139 mmol/L (ref 135–145)
Total Bilirubin: 0.6 mg/dL (ref 0.3–1.2)
Total Protein: 5.6 g/dL — ABNORMAL LOW (ref 6.5–8.1)

## 2019-12-23 LAB — GLUCOSE, CAPILLARY
Glucose-Capillary: 111 mg/dL — ABNORMAL HIGH (ref 70–99)
Glucose-Capillary: 126 mg/dL — ABNORMAL HIGH (ref 70–99)
Glucose-Capillary: 162 mg/dL — ABNORMAL HIGH (ref 70–99)
Glucose-Capillary: 171 mg/dL — ABNORMAL HIGH (ref 70–99)
Glucose-Capillary: 180 mg/dL — ABNORMAL HIGH (ref 70–99)
Glucose-Capillary: 183 mg/dL — ABNORMAL HIGH (ref 70–99)

## 2019-12-23 LAB — MAGNESIUM: Magnesium: 2 mg/dL (ref 1.7–2.4)

## 2019-12-23 LAB — PHOSPHORUS: Phosphorus: 2.9 mg/dL (ref 2.5–4.6)

## 2019-12-23 MED ORDER — METHYLPREDNISOLONE SODIUM SUCC 40 MG IJ SOLR
40.0000 mg | Freq: Four times a day (QID) | INTRAMUSCULAR | Status: AC
  Administered 2019-12-23 – 2019-12-24 (×6): 40 mg via INTRAVENOUS
  Filled 2019-12-23 (×6): qty 1

## 2019-12-23 MED ORDER — IPRATROPIUM-ALBUTEROL 0.5-2.5 (3) MG/3ML IN SOLN
3.0000 mL | Freq: Four times a day (QID) | RESPIRATORY_TRACT | Status: DC | PRN
  Administered 2019-12-24: 3 mL via RESPIRATORY_TRACT
  Filled 2019-12-23 (×2): qty 3

## 2019-12-23 MED ORDER — IPRATROPIUM-ALBUTEROL 0.5-2.5 (3) MG/3ML IN SOLN
3.0000 mL | Freq: Four times a day (QID) | RESPIRATORY_TRACT | Status: DC
  Administered 2019-12-23: 3 mL via RESPIRATORY_TRACT
  Filled 2019-12-23 (×2): qty 3

## 2019-12-23 MED ORDER — POTASSIUM CHLORIDE CRYS ER 20 MEQ PO TBCR
40.0000 meq | EXTENDED_RELEASE_TABLET | ORAL | Status: AC
  Administered 2019-12-23 (×2): 40 meq via ORAL
  Filled 2019-12-23 (×2): qty 2

## 2019-12-23 NOTE — Progress Notes (Addendum)
PROGRESS NOTE    Alexander Booth [facility]  [id number] DOB: April 16, 1946 DOA: 12/19/2019 PCP: [name], NP   Chief Complaint  Patient presents with  . Altered Mental Status    Brief Narrative:  74 y.o.malewithPMH of Dementia, COPD/Centrilobular Emphysema, and Aortic Atherosclerosiswho presented to ED with acute progressively worsening weakness and fatigue. As per daughter, the night before admission patient went to the bathroom and sat down on toilet. He felt very weak where he was unable to get up. His family had to lift him from toilet and put him to sleep. In the morning, patient still felt weak and could not get out of bed. The family brought him to ED. On evaluation, patient was noted to be hypoxic requiring 15L, mildlyshortness of breath, fevers up to 103, mild cough. [place] shows left sided infiltrates concerning for pneumonia. Lactate is 3.1 mmol/L. He has been vaccinated for COVID and COVID test is negative.Further history is limited as patient has dementia.  Assessment & Plan:   Active Problems:   Pneumonia  Community-acquired pneumonia with concern for aspiration/acute respiratory failure with hypoxia/sepsis present on admission/septic shock Patient found to have bilateral pneumonia which is the reason for his hypoxia.  CT scan did not show any PE.  Patient was noted to have elevated lactic acid level.  There was concern for sepsis on admission.   CXR 6/2 with progression of L>R pneumonia CXR 6/3 with stable bilateral lung opacities, L>R, concerning for multifocal pneumonia Vancomycin (5/30 - present), cefepime/flagyl (5/30), zosyn (5/31 - present), azithromycin  (5/31 - present) SLP eval -> mild aspiration risk, recommending regular/thin liquids MRSA pcr negative Sputum cx pending collection Blood cx NGTD x3, urine cx no growth Stable on 4 L, wean as tolerated Will need evaluation for oxygen prior to d/c   Acute COPD exacerbation He was noted to be wheezing.   He has Sye Schroepfer history of COPD.  Wheezing noted to be worse today, transition to IV steroids scheduled and prn nebs  Acute renal failure Baseline creatinine around 1.2.  Noted to be 1.6 at admission.  Creatinine improved with IVF.  Essential hypertension His blood pressure medications were placed on hold due to sepsis.  Consider resumption if stable in next 24 hrs.  Chronic back pain/history of ankylosing spondylitis Resume his home medications including hydrocodone and Robaxin but at Kumari Sculley lower dose.  He apparently was started on by his pain management specialist recently but patient did not tolerate it well so it is he is not taking that anymore.  Also on Neurontin which will be continued at Travone Georg lower dose.  Add laxatives.  Has buprenorphine on med list, he's not taking this per daughter.  Diabetes mellitus type 2, uncontrolled with hyperglycemia CBGs are reasonably well controlled.  Continue SSI.  On Metformin at home which is held.  History of dementia On Aricept, Namenda and Depakote.  Behavior is stable.    History of BPH Continue Flomax.  Aortic atherosclerosis  Aspirin and statin.  Macrocytic anemia No drop in hemoglobin noted today.  No evidence of overt bleeding.  TSH is normal.  B12 is noted to be 325.  Low normal so we will start him on supplementation.  No clear evidence for iron deficiency.  .  Thrombocytopenia: continue to monitor, mild  DVT prophylaxis: lovenox Code Status: DNR Family Communication: wife/daughter at bedside Disposition:   Status is: Inpatient  Remains inpatient appropriate because:Inpatient level of care appropriate due to severity of illness   Dispo: The patient is  from: Home              Anticipated d/c is to: Home              Anticipated d/c date is: 3 days              Patient currently is not medically stable to d/c.        Consultants:   none  Procedures:   none  Antimicrobials:  Anti-infectives (From admission, onward)     Start     Dose/Rate Route Frequency Ordered Stop   12/20/19 1200  piperacillin-tazobactam (ZOSYN) IVPB 3.375 g     3.375 g 12.5 mL/hr over 240 Minutes Intravenous Every 8 hours 12/20/19 1058     12/20/19 1130  azithromycin (ZITHROMAX) 500 mg in sodium chloride 0.9 % 250 mL IVPB     500 mg 250 mL/hr over 60 Minutes Intravenous Every 24 hours 12/20/19 1038 12/23/19 1326   12/20/19 0600  vancomycin (VANCOREADY) IVPB 750 mg/150 mL  Status:  Discontinued     750 mg 150 mL/hr over 60 Minutes Intravenous Every 12 hours 12/19/19 1645 12/22/19 1024   12/20/19 0400  ceFEPIme (MAXIPIME) 2 g in sodium chloride 0.9 % 100 mL IVPB  Status:  Discontinued     2 g 200 mL/hr over 30 Minutes Intravenous Every 12 hours 12/19/19 1645 12/20/19 1038   12/19/19 1600  ceFEPIme (MAXIPIME) 2 g in sodium chloride 0.9 % 100 mL IVPB     2 g 200 mL/hr over 30 Minutes Intravenous  Once 12/19/19 1553 12/19/19 1642   12/19/19 1600  metroNIDAZOLE (FLAGYL) IVPB 500 mg     500 mg 100 mL/hr over 60 Minutes Intravenous  Once 12/19/19 1553 12/19/19 1754   12/19/19 1600  vancomycin (VANCOCIN) IVPB 1000 mg/200 mL premix  Status:  Discontinued     1,000 mg 200 mL/hr over 60 Minutes Intravenous  Once 12/19/19 1553 12/19/19 1554   12/19/19 1600  vancomycin (VANCOREADY) IVPB 1500 mg/300 mL     1,500 mg 150 mL/hr over 120 Minutes Intravenous  Once 12/19/19 1554 12/19/19 2013     Subjective: No new complaints Family notes he looks not as good today  Objective: Vitals:   12/23/19 0400 12/23/19 0747 12/23/19 1000 12/23/19 1319  BP: (!) 138/48 (!) 189/72  (!) 173/77  Pulse: 82     Resp: 20     Temp:  98.3 F (36.8 C)  98.1 F (36.7 C)  TempSrc:  Oral  Oral  SpO2: 93%  94%   Weight:      Height:       No intake or output data in the 24 hours ending 12/23/19 1554 Filed Weights   12/19/19 1548  Weight: 84.4 kg    Examination:  General: No acute distress. Cardiovascular: Heart sounds show Mckaylah Bettendorf regular rate, and  rhythm. Lungs: crackles at bases, some scattered wheezing Abdomen: Soft, nontender, nondistended  Neurological: Alert and oriented 3. Moves all extremities 4 . Cranial nerves II through XII grossly intact. Skin: Warm and dry. No rashes or lesions. Extremities: No clubbing or cyanosis. No edema.   Data Reviewed: I have personally reviewed following labs and imaging studies  CBC: Recent Labs  Lab 12/19/19 1556 12/20/19 0348 12/22/19 0618 12/23/19 0255  WBC 6.8 9.9 8.3 7.1  NEUTROABS 6.1  --   --  5.7  HGB 11.6* 11.0* 10.4* 10.4*  HCT 36.1* 33.9* 31.4* 31.7*  MCV 104.0* 103.0* 101.0* 100.0  PLT 142* 130*  114* 125*    Basic Metabolic Panel: Recent Labs  Lab 12/19/19 1556 12/20/19 0348 12/22/19 0618 12/23/19 0255  NA 137 137 138 139  K 4.4 4.3 3.5 3.1*  CL 103 103 105 103  CO2 22 24 24 26   GLUCOSE 189* 149* 119* 112*  BUN 25* 26* 18 14  CREATININE 1.61* 1.30* 0.93 0.92  CALCIUM 7.8* 8.1* 8.2* 8.4*  MG  --   --   --  2.0  PHOS  --   --   --  2.9    GFR: Estimated Creatinine Clearance: 75 mL/min (by C-G formula based on SCr of 0.92 mg/dL).  Liver Function Tests: Recent Labs  Lab 12/19/19 1556 12/23/19 0255  AST 29 35  ALT 12 37  ALKPHOS 25* 37*  BILITOT 0.5 0.6  PROT 5.3* 5.6*  ALBUMIN 2.6* 2.4*    CBG: Recent Labs  Lab 12/22/19 1954 12/22/19 2313 12/23/19 0319 12/23/19 0746 12/23/19 1313  GLUCAP 187* 144* 111* 126* 180*     Recent Results (from the past 240 hour(s))  Blood Culture (routine x 2)     Status: None (Preliminary result)   Collection Time: 12/19/19  3:51 PM   Specimen: BLOOD  Result Value Ref Range Status   Specimen Description BLOOD BLOOD LEFT FOREARM  Final   Special Requests   Final    BOTTLES DRAWN AEROBIC AND ANAEROBIC Blood Culture adequate volume   Culture   Final    NO GROWTH 4 DAYS Performed at [facility], [place] [address], [place], [place] [address]    Report Status PENDING  Incomplete  SARS Coronavirus 2 by RT  PCR (hospital order, performed in [facility] hospital lab) Nasopharyngeal Nasopharyngeal Swab     Status: None   Collection Time: 12/19/19  3:52 PM   Specimen: Nasopharyngeal Swab  Result Value Ref Range Status   SARS Coronavirus 2 NEGATIVE NEGATIVE Final    Comment: (NOTE) SARS-CoV-2 target nucleic acids are NOT DETECTED. The SARS-CoV-2 RNA is generally detectable in upper and lower respiratory specimens during the acute phase of infection. The lowest concentration of SARS-CoV-2 viral copies this assay can detect is 250 copies / mL. Shaila Gilchrest negative result does not preclude SARS-CoV-2 infection and should not be used as the sole basis for treatment or other patient management decisions.  Marielys Trinidad negative result may occur with improper specimen collection / handling, submission of specimen other than nasopharyngeal swab, presence of viral mutation(s) within the areas targeted by this assay, and inadequate number of viral copies (<250 copies / mL). Yemariam Magar negative result must be combined with clinical observations, patient history, and epidemiological information. Fact Sheet for Patients:   StrictlyIdeas.no Fact Sheet for Healthcare Providers: BankingDealers.co.za This test is not yet approved or cleared  by the [place] FDA and has been authorized for detection and/or diagnosis of SARS-CoV-2 by FDA under an Emergency Use Authorization (EUA).  This EUA will remain in effect (meaning this test can be used) for the duration of the COVID-19 declaration under Section 564(b)(1) of the Act, 21 U.S.C. section 360bbb-3(b)(1), unless the authorization is terminated or revoked sooner. Performed at [facility], [place] [address], [place], [place] [address]   Blood Culture (routine x 2)     Status: None (Preliminary result)   Collection Time: 12/19/19  4:09 PM   Specimen: BLOOD  Result Value Ref Range Status   Specimen Description BLOOD RIGHT ANTECUBITAL   Final   Special Requests   Final  BOTTLES DRAWN AEROBIC AND ANAEROBIC Blood Culture adequate volume   Culture   Final    NO GROWTH 4 DAYS Performed at [facility], [place] [address] [place], [place] [address]    Report Status PENDING  Incomplete  Urine culture     Status: None   Collection Time: 12/19/19  5:59 PM   Specimen: In/Out Cath Urine  Result Value Ref Range Status   Specimen Description IN/OUT CATH URINE  Final   Special Requests NONE  Final   Culture   Final    NO GROWTH Performed at [facility], [place] [address], [place], [place] [address]    Report Status 12/20/2019 FINAL  Final  MRSA PCR Screening     Status: None   Collection Time: 12/22/19  5:14 PM   Specimen: Nasopharyngeal  Result Value Ref Range Status   MRSA by PCR NEGATIVE NEGATIVE Final    Comment:        The GeneXpert MRSA Assay (FDA approved for NASAL specimens only), is one component of [place] comprehensive MRSA colonization surveillance program. It is not intended to diagnose MRSA infection nor to guide or monitor treatment for MRSA infections. Performed at [facility], [place] [address] [place], [place] [address]          Radiology Studies: DG CHEST PORT 1 VIEW  Result Date: 12/23/2019 CLINICAL DATA:  Hypoxia. EXAM: PORTABLE CHEST 1 VIEW COMPARISON:  December 22, 2019. FINDINGS: Stable cardiomediastinal silhouette. No pneumothorax is noted. Stable left perihilar and basilar opacity is noted concerning for pneumonia with probable small pleural effusion. Stable right basilar opacity is noted as well. Bony thorax is unremarkable. IMPRESSION: Stable bilateral lung opacities, left greater than right, concerning for multifocal pneumonia. Probable small left pleural effusion. Electronically Signed   By: Marijo Conception M.D.   On: 12/23/2019 11:56   DG CHEST PORT 1 VIEW  Result Date: 12/22/2019 CLINICAL DATA:  Shortness of breath EXAM: PORTABLE CHEST 1 VIEW COMPARISON:  12/19/2019 FINDINGS:  Reticular opacity on the left more than right with pneumonia by recent CT. The opacification has increased. Small left pleural effusion. No visible pneumothorax. Borderline heart size. IMPRESSION: Progression of left more than right pneumonia with probable small left pleural effusion. Electronically Signed   By: Monte Fantasia M.D.   On: 12/22/2019 11:10        Scheduled Meds: . aspirin  81 mg Oral Daily  . citalopram  20 mg Oral Daily  . divalproex  500 mg Oral BID  . donepezil  10 mg Oral q morning - 10a  . enoxaparin (LOVENOX) injection  40 mg Subcutaneous Q24H  . gabapentin  300 mg Oral QHS  . insulin aspart  0-9 Units Subcutaneous Q4H  . loperamide  2 mg Oral Once  . memantine  7 mg Oral q morning - 10a  . methocarbamol  500 mg Oral TID  . methylPREDNISolone (SOLU-MEDROL) injection  40 mg Intravenous Q6H  . polyethylene glycol  17 g Oral Daily  . saccharomyces boulardii  250 mg Oral BID  . senna-docusate  1 tablet Oral QHS  . simvastatin  40 mg Oral q1800  . tamsulosin  0.4 mg Oral q1800   Continuous Infusions: . piperacillin-tazobactam (ZOSYN)  IV 3.375 g (12/23/19 1434)     LOS: 4 days    Time spent: over 30 min    Fayrene Helper, MD [facility]   To contact the attending provider between 7A-7P or the covering  provider during after hours 7P-7A, please log into the web site [email or web address] and access using universal [place] password for that web site. If you do not have the password, please call the hospital operator.  12/23/2019, 3:54 PM

## 2019-12-23 NOTE — Progress Notes (Deleted)
SATURATION QUALIFICATIONS: (This note is used to comply with regulatory documentation for home oxygen Patient Saturations on Room Air at Rest = 95%  Patient Saturations on Room Air while Ambulating =  98%  Patient Saturations on N/A Liters of oxygen while Ambulating = 98*%  Please briefly explain why patient needs home oxygen: Patient dd not c/o shortness of breath while ambulating on room air

## 2019-12-23 NOTE — Progress Notes (Signed)
Occupational Therapy Treatment Patient Details Name: Alexander Booth MRN: [id number] DOB: Nov 07, 1945 Today's Date: 12/23/2019    History of present illness Pt is 74 y.o. male with PMH of Dementia, COPD/Centrilobular Emphysema, and Aortic Atherosclerosis who presented to ED with acute progressively worsening weakness and fatigue.   Pt found to have PNE and requiring 15 L O2 initially.   OT comments  Treatment session with focus on activity tolerance and endurance during self-care tasks and functional mobility.  Pt willing to engage in bathing at sink side.  Ambulated to sink with RW with min guard and cues for hand placement during sit <> stand.  Pt O2 sats maintained 87-93% on 4L during activity, dropping to 79-81% when bending to complete posterior hygiene with inability to rebound therefore increased O2 to 5L during bathing task with pt able to return to low 90s with increased time and cues for breathing technique - pt with tendency to take shallow breaths. Pt will continue to benefit from OT acutely to increase independence and endurance with self-care tasks and functional mobility prior to d/c home with HHOT.  Follow Up Recommendations  Home health OT;Supervision/Assistance - 24 hour    Equipment Recommendations  None recommended by OT       Precautions / Restrictions Precautions Precautions: Fall Precaution Comments: monitor O2       Mobility Bed Mobility Overal bed mobility: Needs Assistance Bed Mobility: Supine to Sit     Supine to sit: Min assist     General bed mobility comments: required min assist to come to EOB due to fatigue this session  Transfers Overall transfer level: Needs assistance Equipment used: Rolling walker (2 wheeled) Transfers: Sit to/from Stand Sit to Stand: Min guard;Min assist         General transfer comment: Provided cues for safet hand placement during all sit <> stand and min guard when ambulating with RW to recliner at sink    Balance  Overall balance assessment: Needs assistance Sitting-balance support: No upper extremity supported;Feet supported Sitting balance-Leahy Scale: Good     Standing balance support: Bilateral upper extremity supported;During functional activity Standing balance-Leahy Scale: Fair                             ADL either performed or assessed with clinical judgement   ADL Overall ADL's : Needs assistance/impaired     Grooming: Wash/dry face;Min guard;Standing Grooming Details (indicate cue type and reason): close minguard for standing balance Upper Body Bathing: Set up;Supervision/ safety;Sitting   Lower Body Bathing: Min guard;Minimal assistance;Sit to/from stand Lower Body Bathing Details (indicate cue type and reason): required assist to stand from lower seat height Upper Body Dressing : Supervision/safety;Set up;Sitting   Lower Body Dressing: Min guard;Minimal assistance;Sit to/from stand Lower Body Dressing Details (indicate cue type and reason): intermittent minA for standing balance, pt able to don his socks seated EOB given increased time/effort Toilet Transfer: Min guard;Ambulation;RW;Grab bars   Toileting- Clothing Manipulation and Hygiene: Minimal assistance;Sitting/lateral lean;Sit to/from stand Toileting - Clothing Manipulation Details (indicate cue type and reason): pt performing posterior pericare; intermittent minA for balance.  Cues for breathing as pt with decreased sats during posterior care     Functional mobility during ADLs: Min guard;Rolling walker General ADL Comments: pt with decreased activity tolerance and WOB with activity                Cognition Arousal/Alertness: Awake/alert Behavior During Therapy: WFL for tasks  assessed/performed Overall Cognitive Status: History of cognitive impairments - at baseline                                 General Comments: hx of dementia, following basic commands, cues for self-monitoring                    Pertinent Vitals/ Pain       Pain Assessment: No/denies pain         Frequency  Min 2X/week        Progress Toward Goals  OT Goals(current goals can now be found in the care plan section)  Progress towards OT goals: Progressing toward goals  Acute Rehab OT Goals Patient Stated Goal: return home OT Goal Formulation: With patient Time For Goal Achievement: 01/04/20 Potential to Achieve Goals: Good  Plan Discharge plan remains appropriate       AM-PAC OT "6 Clicks" Daily Activity     Outcome Measure   Help from another person eating meals?: A Little Help from another person taking care of personal grooming?: A Little Help from another person toileting, which includes using toliet, bedpan, or urinal?: A Little Help from another person bathing (including washing, rinsing, drying)?: A Little Help from another person to put on and taking off regular upper body clothing?: A Little Help from another person to put on and taking off regular lower body clothing?: A Little 6 Click Score: 18    End of Session Equipment Utilized During Treatment: Gait belt;Rolling walker;Oxygen  OT Visit Diagnosis: Muscle weakness (generalized) (M62.81);Unsteadiness on feet (R26.81)   Activity Tolerance Patient tolerated treatment well   Patient Left in chair;with nursing/sitter in room   Nurse Communication Mobility status(O2 needs)        Time: [id number] OT Time Calculation (min): 29 min  Charges: OT General Charges $OT Visit: 1 Visit OT Treatments $Self Care/Home Management : 23-37 mins    Simonne Come [phone] 12/23/2019, 11:47 AM

## 2019-12-23 NOTE — Progress Notes (Signed)
PT Cancellation Note  Patient Details Name: Alexander Booth MRN: [id number] DOB: 18-Jul-1946   Cancelled Treatment:    Reason Eval/Treat Not Completed: Other (comment)  Checked on pt in the morning and he requested PT later.  When returned in pm , pt had worked with OT earlier and wife reports very fatigued, decreased O2 sats, and request to let pt rest.  Will f/u at later date. Maggie Font, PT Acute Rehab Services Pager [phone] [facility] [phone] [facility] 12/23/2019, 1:45 PM

## 2019-12-24 DIAGNOSIS — J189 Pneumonia, unspecified organism: Secondary | ICD-10-CM

## 2019-12-24 LAB — HEMOGLOBIN A1C
Hgb A1c MFr Bld: 7.1 % — ABNORMAL HIGH (ref 4.8–5.6)
Mean Plasma Glucose: 157.07 mg/dL

## 2019-12-24 LAB — CBC WITH DIFFERENTIAL/PLATELET
Abs Immature Granulocytes: 0.13 10*3/uL — ABNORMAL HIGH (ref 0.00–0.07)
Basophils Absolute: 0 10*3/uL (ref 0.0–0.1)
Basophils Relative: 0 %
Eosinophils Absolute: 0 10*3/uL (ref 0.0–0.5)
Eosinophils Relative: 0 %
HCT: 35.2 % — ABNORMAL LOW (ref 39.0–52.0)
Hemoglobin: 11.5 g/dL — ABNORMAL LOW (ref 13.0–17.0)
Immature Granulocytes: 2 %
Lymphocytes Relative: 7 %
Lymphs Abs: 0.4 10*3/uL — ABNORMAL LOW (ref 0.7–4.0)
MCH: 32.6 pg (ref 26.0–34.0)
MCHC: 32.7 g/dL (ref 30.0–36.0)
MCV: 99.7 fL (ref 80.0–100.0)
Monocytes Absolute: 0.4 10*3/uL (ref 0.1–1.0)
Monocytes Relative: 7 %
Neutro Abs: 5.2 10*3/uL (ref 1.7–7.7)
Neutrophils Relative %: 84 %
Platelets: 135 10*3/uL — ABNORMAL LOW (ref 150–400)
RBC: 3.53 MIL/uL — ABNORMAL LOW (ref 4.22–5.81)
RDW: 13.4 % (ref 11.5–15.5)
WBC: 6.1 10*3/uL (ref 4.0–10.5)
nRBC: 0 % (ref 0.0–0.2)

## 2019-12-24 LAB — CULTURE, BLOOD (ROUTINE X 2)
Culture: NO GROWTH
Culture: NO GROWTH
Special Requests: ADEQUATE
Special Requests: ADEQUATE

## 2019-12-24 LAB — HEPATITIS PANEL, ACUTE
HCV Ab: NONREACTIVE
Hep A IgM: NONREACTIVE
Hep B C IgM: NONREACTIVE
Hepatitis B Surface Ag: NONREACTIVE

## 2019-12-24 LAB — COMPREHENSIVE METABOLIC PANEL
ALT: 68 U/L — ABNORMAL HIGH (ref 0–44)
AST: 44 U/L — ABNORMAL HIGH (ref 15–41)
Albumin: 2.6 g/dL — ABNORMAL LOW (ref 3.5–5.0)
Alkaline Phosphatase: 51 U/L (ref 38–126)
Anion gap: 13 (ref 5–15)
BUN: 18 mg/dL (ref 8–23)
CO2: 23 mmol/L (ref 22–32)
Calcium: 8.6 mg/dL — ABNORMAL LOW (ref 8.9–10.3)
Chloride: 102 mmol/L (ref 98–111)
Creatinine, Ser: 1.04 mg/dL (ref 0.61–1.24)
GFR calc Af Amer: >60 mL/min (ref >60)
GFR calc non Af Amer: >60 mL/min (ref >60)
Glucose, Bld: 174 mg/dL — ABNORMAL HIGH (ref 70–99)
Potassium: 4.4 mmol/L (ref 3.5–5.1)
Sodium: 138 mmol/L (ref 135–145)
Total Bilirubin: 0.8 mg/dL (ref 0.3–1.2)
Total Protein: 5.9 g/dL — ABNORMAL LOW (ref 6.5–8.1)

## 2019-12-24 LAB — GLUCOSE, CAPILLARY
Glucose-Capillary: 170 mg/dL — ABNORMAL HIGH (ref 70–99)
Glucose-Capillary: 221 mg/dL — ABNORMAL HIGH (ref 70–99)
Glucose-Capillary: 240 mg/dL — ABNORMAL HIGH (ref 70–99)
Glucose-Capillary: 272 mg/dL — ABNORMAL HIGH (ref 70–99)
Glucose-Capillary: 170 mg/dL — ABNORMAL HIGH (ref 70–99)
Glucose-Capillary: 181 mg/dL — ABNORMAL HIGH (ref 70–99)

## 2019-12-24 LAB — MAGNESIUM: Magnesium: 2.2 mg/dL (ref 1.7–2.4)

## 2019-12-24 LAB — PHOSPHORUS: Phosphorus: 3.6 mg/dL (ref 2.5–4.6)

## 2019-12-24 MED ORDER — AZITHROMYCIN 250 MG PO TABS
500.0000 mg | ORAL_TABLET | Freq: Every day | ORAL | Status: AC
  Administered 2019-12-24: 500 mg via ORAL
  Filled 2019-12-24: qty 2

## 2019-12-24 MED ORDER — INSULIN ASPART 100 UNIT/ML ~~LOC~~ SOLN
0.0000 [IU] | Freq: Every day | SUBCUTANEOUS | Status: DC
  Administered 2019-12-24: 2 [IU] via SUBCUTANEOUS

## 2019-12-24 MED ORDER — PREDNISONE 20 MG PO TABS
40.0000 mg | ORAL_TABLET | Freq: Every day | ORAL | Status: DC
  Administered 2019-12-25: 40 mg via ORAL
  Filled 2019-12-24: qty 2

## 2019-12-24 MED ORDER — INSULIN ASPART 100 UNIT/ML ~~LOC~~ SOLN
0.0000 [IU] | Freq: Three times a day (TID) | SUBCUTANEOUS | Status: DC
  Administered 2019-12-24: 5 [IU] via SUBCUTANEOUS
  Administered 2019-12-25: 1 [IU] via SUBCUTANEOUS
  Administered 2019-12-25: 2 [IU] via SUBCUTANEOUS

## 2019-12-24 MED ORDER — INSULIN DETEMIR 100 UNIT/ML ~~LOC~~ SOLN
7.0000 [IU] | Freq: Two times a day (BID) | SUBCUTANEOUS | Status: DC
  Administered 2019-12-24 – 2019-12-25 (×2): 7 [IU] via SUBCUTANEOUS
  Filled 2019-12-24 (×3): qty 0.07

## 2019-12-24 MED ORDER — INSULIN ASPART 100 UNIT/ML ~~LOC~~ SOLN
3.0000 [IU] | Freq: Three times a day (TID) | SUBCUTANEOUS | Status: DC
  Administered 2019-12-24 – 2019-12-25 (×2): 3 [IU] via SUBCUTANEOUS

## 2019-12-24 NOTE — Progress Notes (Signed)
Pharmacy Antibiotic Note  Alexander Booth is a 74 y.o. male admitted on 12/19/2019 with sepsis, suspected pna.  Pharmacy has been consulted for Zosyn dosing.   Day #6 of abx for pna, narrowed to zosyn + azithromycin 6/2, last day of azithromycin 6/3.  Remains on 5L [place], AF, wbc wnl, renal function stable SCr 1.04.    Plan: Zosyn 3.375g IV every 8 hours (extended infusion) Monitor renal function, clinical progression to narrow further F/u LOT   Height: 6' (182.9 cm) Weight: 84.4 kg (186 lb) IBW/kg (Calculated) : 77.6  Temp (24hrs), Avg:98.1 F (36.7 C), Min:97.8 F (36.6 C), Max:98.3 F (36.8 C)  Recent Labs  Lab 12/19/19 1556 12/19/19 1820 12/19/19 2313 12/20/19 0348 12/22/19 0618 12/23/19 0255 12/24/19 0434  WBC 6.8  --   --  9.9 8.3 7.1 6.1  CREATININE 1.61*  --   --  1.30* 0.93 0.92 1.04  LATICACIDVEN 3.1* 2.4* 2.6* 2.6*  --   --   --     Estimated Creatinine Clearance: 66.3 mL/min (by C-G formula based on SCr of 1.04 mg/dL).    Allergies  Allergen Reactions  . Codeine Hives and Itching    Antimicrobials this admission: Vanc 5/30>>6/2 Cefepime 5/30>> 5/31 Zosyn 5/31>> Azithro 5/31 x 5d Flagyl x 1 5/30  Dose adjustments this admission: N/A  Microbiology results: 5/30: BC x 2>>ngtd x4d 5/30: COVID: neg MRSA PCR negative  Thank you for allowing pharmacy to be a part of this patient's care.  Bertis Ruddy, PharmD Clinical Pharmacist Please check AMION for all [name] numbers 12/24/2019 7:32 AM

## 2019-12-24 NOTE — Progress Notes (Addendum)
PROGRESS NOTE    Marlee Trentman [facility]  [id number] DOB: 12-Jan-1946 DOA: 12/19/2019 PCP: [name], NP   Chief Complaint  Patient presents with  . Altered Mental Status    Brief Narrative:  74 y.o.malewithPMH of Dementia, COPD/Centrilobular Emphysema, and Aortic Atherosclerosiswho presented to ED with acute progressively worsening weakness and fatigue. As per daughter, the night before admission patient went to the bathroom and sat down on toilet. He felt very weak where he was unable to get up. His family had to lift him from toilet and put him to sleep. In the morning, patient still felt weak and could not get out of bed. The family brought him to ED. On evaluation, patient was noted to be hypoxic requiring 15L, mildlyshortness of breath, fevers up to 103, mild cough. [place] shows left sided infiltrates concerning for pneumonia. Lactate is 3.1 mmol/L. He has been vaccinated for COVID and COVID test is negative.Further history is limited as patient has dementia.  Assessment & Plan:   Active Problems:   Pneumonia   Sepsis ([place])  Community-acquired pneumonia with concern for aspiration/acute respiratory failure with hypoxia/sepsis present on admission/septic shock Patient found to have bilateral pneumonia which is the reason for his hypoxia.  CT scan did not show any PE.  Patient was noted to have elevated lactic acid level.  There was concern for sepsis on admission.   CXR 6/2 with progression of L>R pneumonia CXR 6/3 with stable bilateral lung opacities, L>R, concerning for multifocal pneumonia Vancomycin (5/30 - 6/1), cefepime/flagyl (5/30), zosyn (5/31 - present), azithromycin  (5/31 - 6/4).  Plan for 7 day course (last day 6/5) SLP eval -> mild aspiration risk, recommending regular/thin liquids MRSA pcr negative Sputum cx pending collection Blood cx NGTD x5, urine cx no growth Stable on 3 L at rest, able to wean to this at bedside Will need evaluation for oxygen  prior to d/c  OOB, IS, flutter, mobilize - will continue abx and hopefully his O2 requirement will continue to gradually improve.  Titrate sats for SpO2 >88%.  Acute COPD exacerbation He was noted to be wheezing.  He has Raffaella Edison history of COPD, not on any oxygen prior to admission.  Transition to PO steroids tomorrow scheduled and prn nebs  Acute renal failure Baseline creatinine around 1.2.  Noted to be 1.6 at admission.  Creatinine improved with IVF.  Essential hypertension His blood pressure medications were placed on hold due to sepsis.  Consider resumption if stable in next 24 hrs.  Chronic back pain/history of ankylosing spondylitis Resume his home medications including hydrocodone and Robaxin but at Benicia Bergevin lower dose.  He apparently was started on by his pain management specialist recently but patient did not tolerate it well so it is he is not taking that anymore.  Also on Neurontin which will be continued at Reygan Heagle lower dose.  Add laxatives.  Has buprenorphine on med list, he's not taking this per daughter.  Diabetes mellitus type 2, uncontrolled with hyperglycemia Worsening BG's with steroids Start basal, bolus, continue SSI  History of dementia On Aricept, Namenda and Depakote.  Behavior is stable.    History of BPH Continue Flomax.  Aortic atherosclerosis  Aspirin and statin.  Macrocytic anemia No drop in hemoglobin noted today.  No evidence of overt bleeding.  TSH is normal.  B12 is noted to be 325.  Low normal so we will start him on supplementation.  No clear evidence for iron deficiency.  .  Thrombocytopenia: continue to monitor, mild  DVT prophylaxis: lovenox Code Status: DNR Family Communication: daughter at bedside 6/4 - called back to room and cell phone later, but no answer (per RN, wanted to speak with me) Disposition:   Status is: Inpatient  Remains inpatient appropriate because:Inpatient level of care appropriate due to severity of illness   Dispo: The  patient is from: Home              Anticipated d/c is to: Home              Anticipated d/c date is: 3 days              Patient currently is not medically stable to d/c.        Consultants:   none  Procedures:   none  Antimicrobials:  Anti-infectives (From admission, onward)   Start     Dose/Rate Route Frequency Ordered Stop   12/24/19 1000  azithromycin (ZITHROMAX) tablet 500 mg     500 mg Oral Daily 12/24/19 0746 12/24/19 0923   12/20/19 1200  piperacillin-tazobactam (ZOSYN) IVPB 3.375 g     3.375 g 12.5 mL/hr over 240 Minutes Intravenous Every 8 hours 12/20/19 1058     12/20/19 1130  azithromycin (ZITHROMAX) 500 mg in sodium chloride 0.9 % 250 mL IVPB     500 mg 250 mL/hr over 60 Minutes Intravenous Every 24 hours 12/20/19 1038 12/23/19 1326   12/20/19 0600  vancomycin (VANCOREADY) IVPB 750 mg/150 mL  Status:  Discontinued     750 mg 150 mL/hr over 60 Minutes Intravenous Every 12 hours 12/19/19 1645 12/22/19 1024   12/20/19 0400  ceFEPIme (MAXIPIME) 2 g in sodium chloride 0.9 % 100 mL IVPB  Status:  Discontinued     2 g 200 mL/hr over 30 Minutes Intravenous Every 12 hours 12/19/19 1645 12/20/19 1038   12/19/19 1600  ceFEPIme (MAXIPIME) 2 g in sodium chloride 0.9 % 100 mL IVPB     2 g 200 mL/hr over 30 Minutes Intravenous  Once 12/19/19 1553 12/19/19 1642   12/19/19 1600  metroNIDAZOLE (FLAGYL) IVPB 500 mg     500 mg 100 mL/hr over 60 Minutes Intravenous  Once 12/19/19 1553 12/19/19 1754   12/19/19 1600  vancomycin (VANCOCIN) IVPB 1000 mg/200 mL premix  Status:  Discontinued     1,000 mg 200 mL/hr over 60 Minutes Intravenous  Once 12/19/19 1553 12/19/19 1554   12/19/19 1600  vancomycin (VANCOREADY) IVPB 1500 mg/300 mL     1,500 mg 150 mL/hr over 120 Minutes Intravenous  Once 12/19/19 1554 12/19/19 2013     Subjective: No new complaints Looks Audrey Eller little better today, discussed with daughter He's eager for discharge home (discussed not ready yet with persistent  significant o2 requirement)  Objective: Vitals:   12/24/19 0411 12/24/19 0823 12/24/19 1226 12/24/19 1545  BP: (!) 158/72 (!) 152/52 (!) 153/55 (!) 168/73  Pulse:  77 78 75  Resp: 19 19 (!) 25 (!) 28  Temp: 98.2 F (36.8 C) 98.9 F (37.2 C) 98.7 F (37.1 C) 98.6 F (37 C)  TempSrc: Oral Oral Oral Oral  SpO2:  92% (!) 88% (!) 88%  Weight:      Height:        Intake/Output Summary (Last 24 hours) at 12/24/2019 1600 Last data filed at 12/24/2019 0754 Gross per 24 hour  Intake 410 ml  Output 750 ml  Net -340 ml   Filed Weights   12/19/19 1548  Weight: 84.4 kg  Examination:  General: No acute distress. Cardiovascular: Heart sounds show Charyl Minervini regular rate, and rhythm Lungs: improved wheezing from yesterday Abdomen: Soft, nontender, nondistended Neurological: Alert and oriented 3. Moves all extremities 4. Cranial nerves II through XII grossly intact. Skin: Warm and dry. No rashes or lesions. Extremities: No clubbing or cyanosis. No edema.   Data Reviewed: I have personally reviewed following labs and imaging studies  CBC: Recent Labs  Lab 12/19/19 1556 12/20/19 0348 12/22/19 0618 12/23/19 0255 12/24/19 0434  WBC 6.8 9.9 8.3 7.1 6.1  NEUTROABS 6.1  --   --  5.7 5.2  HGB 11.6* 11.0* 10.4* 10.4* 11.5*  HCT 36.1* 33.9* 31.4* 31.7* 35.2*  MCV 104.0* 103.0* 101.0* 100.0 99.7  PLT 142* 130* 114* 125* 135*    Basic Metabolic Panel: Recent Labs  Lab 12/19/19 1556 12/20/19 0348 12/22/19 0618 12/23/19 0255 12/24/19 0434  NA 137 137 138 139 138  K 4.4 4.3 3.5 3.1* 4.4  CL 103 103 105 103 102  CO2 22 24 24 26 23   GLUCOSE 189* 149* 119* 112* 174*  BUN 25* 26* 18 14 18   CREATININE 1.61* 1.30* 0.93 0.92 1.04  CALCIUM 7.8* 8.1* 8.2* 8.4* 8.6*  MG  --   --   --  2.0 2.2  PHOS  --   --   --  2.9 3.6    GFR: Estimated Creatinine Clearance: 66.3 mL/min (by C-G formula based on SCr of 1.04 mg/dL).  Liver Function Tests: Recent Labs  Lab 12/19/19 1556 12/23/19 0255  12/24/19 0434  AST 29 35 44*  ALT 12 37 68*  ALKPHOS 25* 37* 51  BILITOT 0.5 0.6 0.8  PROT 5.3* 5.6* 5.9*  ALBUMIN 2.6* 2.4* 2.6*    CBG: Recent Labs  Lab 12/23/19 2341 12/24/19 0403 12/24/19 0828 12/24/19 1224 12/24/19 1546  GLUCAP 162* 170* 221* 240* 272*     Recent Results (from the past 240 hour(s))  Blood Culture (routine x 2)     Status: None   Collection Time: 12/19/19  3:51 PM   Specimen: BLOOD  Result Value Ref Range Status   Specimen Description BLOOD BLOOD LEFT FOREARM  Final   Special Requests   Final    BOTTLES DRAWN AEROBIC AND ANAEROBIC Blood Culture adequate volume   Culture   Final    NO GROWTH 5 DAYS Performed at [facility], [place] [address] [place],  [address]    Report Status 12/24/2019 FINAL  Final  SARS Coronavirus 2 by RT PCR (hospital order, performed in [facility] hospital lab) Nasopharyngeal Nasopharyngeal Swab     Status: None   Collection Time: 12/19/19  3:52 PM   Specimen: Nasopharyngeal Swab  Result Value Ref Range Status   SARS Coronavirus 2 NEGATIVE NEGATIVE Final    Comment: (NOTE) SARS-CoV-2 target nucleic acids are NOT DETECTED. The SARS-CoV-2 RNA is generally detectable in upper and lower respiratory specimens during the acute phase of infection. The lowest concentration of SARS-CoV-2 viral copies this assay can detect is 250 copies / mL. Senie Lanese negative result does not preclude SARS-CoV-2 infection and should not be used as the sole basis for treatment or other patient management decisions.  Rajean Desantiago negative result may occur with improper specimen collection / handling, submission of specimen other than nasopharyngeal swab, presence of viral mutation(s) within the areas targeted by this assay, and inadequate number of viral copies (<250 copies / mL). Victory Strollo negative result must be combined with clinical observations, patient history, and epidemiological information.  Fact Sheet for Patients:     StrictlyIdeas.no Fact Sheet for Healthcare Providers: BankingDealers.co.za This test is not yet approved or cleared  by the [place] FDA and has been authorized for detection and/or diagnosis of SARS-CoV-2 by FDA under an Emergency Use Authorization (EUA).  This EUA will remain in effect (meaning this test can be used) for the duration of the COVID-19 declaration under Section 564(b)(1) of the Act, 21 U.S.C. section 360bbb-3(b)(1), unless the authorization is terminated or revoked sooner. Performed at [facility], [place] [address], [place], [place] [address]   Blood Culture (routine x 2)     Status: None   Collection Time: 12/19/19  4:09 PM   Specimen: BLOOD  Result Value Ref Range Status   Specimen Description BLOOD RIGHT ANTECUBITAL  Final   Special Requests   Final    BOTTLES DRAWN AEROBIC AND ANAEROBIC Blood Culture adequate volume   Culture   Final    NO GROWTH 5 DAYS Performed at [facility], [place] [address], [place], [place] [address]    Report Status 12/24/2019 FINAL  Final  Urine culture     Status: None   Collection Time: 12/19/19  5:59 PM   Specimen: In/Out Cath Urine  Result Value Ref Range Status   Specimen Description IN/OUT CATH URINE  Final   Special Requests NONE  Final   Culture   Final    NO GROWTH Performed at [facility], [place] [address], [place], [place] [address]    Report Status 12/20/2019 FINAL  Final  MRSA PCR Screening     Status: None   Collection Time: 12/22/19  5:14 PM   Specimen: Nasopharyngeal  Result Value Ref Range Status   MRSA by PCR NEGATIVE NEGATIVE Final    Comment:        The GeneXpert MRSA Assay (FDA approved for NASAL specimens only), is one component of Blanch Stang comprehensive MRSA colonization surveillance program. It is not intended to diagnose MRSA infection nor to guide or monitor treatment for MRSA infections. Performed at [facility], [place]  [address], [place], [place] [address]          Radiology Studies: DG CHEST PORT 1 VIEW  Result Date: 12/23/2019 CLINICAL DATA:  Hypoxia. EXAM: PORTABLE CHEST 1 VIEW COMPARISON:  December 22, 2019. FINDINGS: Stable cardiomediastinal silhouette. No pneumothorax is noted. Stable left perihilar and basilar opacity is noted concerning for pneumonia with probable small pleural effusion. Stable right basilar opacity is noted as well. Bony thorax is unremarkable. IMPRESSION: Stable bilateral lung opacities, left greater than right, concerning for multifocal pneumonia. Probable small left pleural effusion. Electronically Signed   By: Marijo Conception M.D.   On: 12/23/2019 11:56        Scheduled Meds: . aspirin  81 mg Oral Daily  . citalopram  20 mg Oral Daily  . divalproex  500 mg Oral BID  . donepezil  10 mg Oral q morning - 10a  . enoxaparin (LOVENOX) injection  40 mg Subcutaneous Q24H  . gabapentin  300 mg Oral QHS  . insulin aspart  0-5 Units Subcutaneous QHS  . insulin aspart  0-9 Units Subcutaneous TID WC  . insulin aspart  3 Units Subcutaneous TID WC  . insulin detemir  7 Units Subcutaneous BID  . loperamide  2 mg Oral Once  . memantine  7 mg Oral q morning - 10a  . methocarbamol  500 mg Oral TID  . methylPREDNISolone (  SOLU-MEDROL) injection  40 mg Intravenous Q6H  . polyethylene glycol  17 g Oral Daily  . saccharomyces boulardii  250 mg Oral BID  . senna-docusate  1 tablet Oral QHS  . simvastatin  40 mg Oral q1800  . tamsulosin  0.4 mg Oral q1800   Continuous Infusions: . piperacillin-tazobactam (ZOSYN)  IV 3.375 g (12/24/19 1231)     LOS: 5 days    Time spent: over 30 min    Fayrene Helper, MD [facility]   To contact the attending provider between 7A-7P or the covering provider during after hours 7P-7A, please log into the web site [email or web address] and access using universal [place] password for that web site. If you do not have the password, please call the hospital  operator.  12/24/2019, 4:00 PM

## 2019-12-24 NOTE — Progress Notes (Signed)
Physical Therapy Treatment Patient Details Name: Alexander Booth MRN: [id number] DOB: Feb 16, 1946 Today's Date: 12/24/2019    History of Present Illness Pt is 74 y.o. male with PMH of Dementia, COPD/Centrilobular Emphysema, and Aortic Atherosclerosis who presented to ED with acute progressively worsening weakness and fatigue.   Pt found to have PNE and requiring 15 L O2 initially.    PT Comments    Pt demonstrating decreased gait distance and stability with smaller stride length.  He required O2 titrated up to 6 LPM to maintain sats 88% with short distance ambulation and standing rest breaks.  Cues for pursed lip breathing and transfer techniques.  Pt on 3 LPM O2 at rest.  Discussed decreased mobility and safety with therapy recommendation now for SNF.  Pt adamantly declining SNF at this time.  Will cont to progress as able.    Follow Up Recommendations  SNF;Supervision/Assistance - 24 hour     Equipment Recommendations  None recommended by PT    Recommendations for Other Services       Precautions / Restrictions Precautions Precautions: Fall Precaution Comments: monitor O2    Mobility  Bed Mobility Overal bed mobility: Needs Assistance Bed Mobility: Sit to Supine       Sit to supine: Min assist      Transfers Overall transfer level: Needs assistance Equipment used: Rolling walker (2 wheeled) Transfers: Sit to/from Stand Sit to Stand: Min assist         General transfer comment: sit to stand x 3; cues for hand placment  Ambulation/Gait Ambulation/Gait assistance: Min assist Gait Distance (Feet): 40 Feet Assistive device: Rolling walker (2 wheeled) Gait Pattern/deviations: Shuffle;Decreased stride length;Step-through pattern Gait velocity: decreased   General Gait Details: Pt required cues for posture, pursed lip breathing,  and RW; took 4 standing rest breaks.  Fatigued easily.  See general comments below for sats   Stairs             Wheelchair  Mobility    Modified Rankin (Stroke Patients Only)       Balance Overall balance assessment: Needs assistance Sitting-balance support: No upper extremity supported;Feet supported Sitting balance-Leahy Scale: Good     Standing balance support: Bilateral upper extremity supported;During functional activity Standing balance-Leahy Scale: Fair Standing balance comment: required RW and min guard                            Cognition Arousal/Alertness: Awake/alert Behavior During Therapy: WFL for tasks assessed/performed Overall Cognitive Status: History of cognitive impairments - at baseline                                 General Comments: Hx of dementia; followed basic commands; decreased safety awareness      Exercises      General Comments General comments (skin integrity, edema, etc.): At arrival O2 sats were 80%. Cued pt for deep breaths, checked to see O2 settings and tube had come unplugged. Rehooked O2, pt on 3 LPM, sats up to 90% with cues for pursed lip breathing with 2 mins. Ambulated initially on 4 LPM O2 with sats down to 85%, increased to 6 LPM and O2 sats 88%. Once pt returned to bed, slowly titrated back to 3 LPM with sats 90%.  Discussed decreased mobility with pt and daughter.  Discussed concern for safety at home with pt and his wife with current level  and PT new recommendations for SNF.  Pt adamanetly declined - stated "I'm not going to try any harder if he ain't going to let me go home."  Educated that decision is his, but at this time therapy recommends SNF, but will try to progress while hospitalized.      Pertinent Vitals/Pain Pain Assessment: No/denies pain    Home Living                      Prior Function            PT Goals (current goals can now be found in the care plan section) Progress towards PT goals: Progressing toward goals    Frequency    Min 3X/week      PT Plan Discharge plan needs to be updated     Co-evaluation              AM-PAC PT "6 Clicks" Mobility   Outcome Measure  Help needed turning from your back to your side while in a flat bed without using bedrails?: A Little Help needed moving from lying on your back to sitting on the side of a flat bed without using bedrails?: A Little Help needed moving to and from a bed to a chair (including a wheelchair)?: A Little Help needed standing up from a chair using your arms (e.g., wheelchair or bedside chair)?: A Little Help needed to walk in hospital room?: A Little Help needed climbing 3-5 steps with a railing? : A Lot 6 Click Score: 17    End of Session Equipment Utilized During Treatment: Gait belt;Oxygen Activity Tolerance: Patient limited by fatigue Patient left: in bed;with call bell/phone within reach;with family/visitor present;with bed alarm set Nurse Communication: Mobility status PT Visit Diagnosis: Unsteadiness on feet (R26.81);History of falling (Z91.81)     Time: 8003-4917 PT Time Calculation (min) (ACUTE ONLY): 24 min  Charges:  $Gait Training: 8-22 mins $Therapeutic Activity: 8-22 mins                     Maggie Font, PT Acute Rehab Services Pager [phone] [facility] [phone] [facility] [phone]    Karlton Lemon 12/24/2019, 12:36 PM

## 2019-12-25 ENCOUNTER — Inpatient Hospital Stay (HOSPITAL_COMMUNITY): Payer: Medicare HMO

## 2019-12-25 LAB — CBC WITH DIFFERENTIAL/PLATELET
Abs Immature Granulocytes: 0.23 10*3/uL — ABNORMAL HIGH (ref 0.00–0.07)
Basophils Absolute: 0 10*3/uL (ref 0.0–0.1)
Basophils Relative: 0 %
Eosinophils Absolute: 0 10*3/uL (ref 0.0–0.5)
Eosinophils Relative: 0 %
HCT: 33.9 % — ABNORMAL LOW (ref 39.0–52.0)
Hemoglobin: 11.4 g/dL — ABNORMAL LOW (ref 13.0–17.0)
Immature Granulocytes: 4 %
Lymphocytes Relative: 16 %
Lymphs Abs: 0.9 10*3/uL (ref 0.7–4.0)
MCH: 32.9 pg (ref 26.0–34.0)
MCHC: 33.6 g/dL (ref 30.0–36.0)
MCV: 98 fL (ref 80.0–100.0)
Monocytes Absolute: 0.5 10*3/uL (ref 0.1–1.0)
Monocytes Relative: 10 %
Neutro Abs: 3.9 10*3/uL (ref 1.7–7.7)
Neutrophils Relative %: 70 %
Platelets: 156 10*3/uL (ref 150–400)
RBC: 3.46 MIL/uL — ABNORMAL LOW (ref 4.22–5.81)
RDW: 13.2 % (ref 11.5–15.5)
WBC: 5.5 10*3/uL (ref 4.0–10.5)
nRBC: 0 % (ref 0.0–0.2)

## 2019-12-25 LAB — PHOSPHORUS: Phosphorus: 3.9 mg/dL (ref 2.5–4.6)

## 2019-12-25 LAB — COMPREHENSIVE METABOLIC PANEL
ALT: 54 U/L — ABNORMAL HIGH (ref 0–44)
AST: 26 U/L (ref 15–41)
Albumin: 2.4 g/dL — ABNORMAL LOW (ref 3.5–5.0)
Alkaline Phosphatase: 43 U/L (ref 38–126)
Anion gap: 10 (ref 5–15)
BUN: 20 mg/dL (ref 8–23)
CO2: 24 mmol/L (ref 22–32)
Calcium: 8.3 mg/dL — ABNORMAL LOW (ref 8.9–10.3)
Chloride: 101 mmol/L (ref 98–111)
Creatinine, Ser: 1 mg/dL (ref 0.61–1.24)
GFR calc Af Amer: >60 mL/min (ref >60)
GFR calc non Af Amer: >60 mL/min (ref >60)
Glucose, Bld: 172 mg/dL — ABNORMAL HIGH (ref 70–99)
Potassium: 4 mmol/L (ref 3.5–5.1)
Sodium: 135 mmol/L (ref 135–145)
Total Bilirubin: 0.7 mg/dL (ref 0.3–1.2)
Total Protein: 5.4 g/dL — ABNORMAL LOW (ref 6.5–8.1)

## 2019-12-25 LAB — BRAIN NATRIURETIC PEPTIDE: B Natriuretic Peptide: 557 pg/mL — ABNORMAL HIGH (ref 0.0–100.0)

## 2019-12-25 LAB — MAGNESIUM: Magnesium: 2 mg/dL (ref 1.7–2.4)

## 2019-12-25 LAB — GLUCOSE, CAPILLARY
Glucose-Capillary: 165 mg/dL — ABNORMAL HIGH (ref 70–99)
Glucose-Capillary: 140 mg/dL — ABNORMAL HIGH (ref 70–99)
Glucose-Capillary: 116 mg/dL — ABNORMAL HIGH (ref 70–99)
Glucose-Capillary: 169 mg/dL — ABNORMAL HIGH (ref 70–99)

## 2019-12-25 MED ORDER — KETOROLAC TROMETHAMINE 30 MG/ML IJ SOLN
15.0000 mg | Freq: Once | INTRAMUSCULAR | Status: AC
  Administered 2019-12-25: 15 mg via INTRAVENOUS
  Filled 2019-12-25: qty 1

## 2019-12-25 MED ORDER — FUROSEMIDE 10 MG/ML IJ SOLN
40.0000 mg | Freq: Once | INTRAMUSCULAR | Status: AC
  Administered 2019-12-25: 40 mg via INTRAVENOUS
  Filled 2019-12-25: qty 4

## 2019-12-25 MED ORDER — PREDNISONE 20 MG PO TABS: 40.0000 mg | ORAL_TABLET | Freq: Every day | ORAL | 0 refills | Status: AC

## 2019-12-25 MED ORDER — LIDOCAINE 5 % EX PTCH
1.0000 | MEDICATED_PATCH | CUTANEOUS | Status: DC
  Administered 2019-12-25: 1 via TRANSDERMAL
  Filled 2019-12-25: qty 1

## 2019-12-25 MED ORDER — DICLOFENAC SODIUM 1 % EX GEL
2.0000 g | Freq: Four times a day (QID) | CUTANEOUS | Status: DC
  Administered 2019-12-25: 2 g via TOPICAL
  Filled 2019-12-25: qty 100

## 2019-12-25 MED ORDER — ACETAMINOPHEN 500 MG PO TABS: 1000.0000 mg | ORAL_TABLET | Freq: Three times a day (TID) | ORAL | Status: DC

## 2019-12-25 MED ORDER — OXYCODONE HCL 5 MG PO TABS: 10.0000 mg | ORAL_TABLET | ORAL | Status: DC | PRN

## 2019-12-25 NOTE — Care Management (Signed)
Spoke w family at bedside. Patient will need home oxygen- ordered through [facility], it will be delivered to room for transport home. Family to transport via private car. Family requesting 3/1 and RW, will be delivered to room as well. HH rating provided to family and they are agreeable to [facility]. Referral accepted.

## 2019-12-25 NOTE — Progress Notes (Signed)
Ambulated with pt to measure spO2.  At rest on Room Air, spO2 91%  Ambulating on Room Air, spO2 decreased to 82%.  Pt required 4L supplemental O2 to achieve spO2 of 95% while ambulating in hallway.  Pt's spO2 returned to 97% at rest while on 4L O2.

## 2019-12-25 NOTE — Discharge Summary (Signed)
Physician Discharge Summary  Alexander Booth [facility] FUX:[id number] DOB: [birth year] DOA: 12/19/2019  PCP: [name], NP  Admit date: 12/19/2019 Discharge date: 12/25/2019  Time spent: 40 minutes  Recommendations for Outpatient Follow-up:  1. Follow outpatient CBC/CMP 2. Follow oxygen requirement outpatient 3. Follow lasix and need for diuresis   4. Follow CXR outpatient   Discharge Diagnoses:  Active Problems:   Pneumonia   Sepsis [facility])   Discharge Condition: guarded  Filed Weights   12/19/19 1548  Weight: 84.4 kg    History of present illness:  74 y.o.malewithPMH of Dementia, COPD/Centrilobular Emphysema, and Aortic Atherosclerosiswho presented to ED with acute progressively worsening weakness and fatigue. As per daughter, the night before admission patient went to the bathroom and sat down on toilet. He felt very weak where he was unable to get up. His family had to lift him from toilet and put him to sleep. In the morning, patient still felt weak and could not get out of bed. The family brought him to ED. On evaluation, patient was noted to be hypoxic requiring 15L, mildlyshortness of breath, fevers up to 103, mild cough. Holland shows left sided infiltrates concerning for pneumonia. Lactate is 3.1 mmol/L. He has been vaccinated for COVID and COVID test is negative.Further history is limited as patient has dementia.  He was admitted for CAP.  He was treated with broad spectrum abx.  He's gradually improved, but had Robbin Loughmiller persistent oxygen requirement.  He was started on steroids for concern for COPD exacerbation.  Diuresis was started on 6/5.  I recommended additional hospitalization to see if his oxygenation could be further improved with diuresis, but the patient declined and desired to go home with oxygen.  Discussed risks of discharging home before we optimize his respiratory status as well as therapy's recommendation for SNF, but he was insistent on going home.  Discharged  on 6/5 with home oxygen and home health.  Complete course of steroids and continue home lasix regimen.    See below for additional details  Hospital Course:  Community-acquired pneumonia with concern for aspiration/acute respiratory failure with hypoxia/sepsis present on admission/septic shock Patient found to have bilateral pneumonia which is the reason for his hypoxia. CT scan did not show any PE. Patient was noted to have elevated lactic acid level. There was concern for sepsis on admission.  CXR 6/2 with progression of L>R pneumonia CXR 6/3 with stable bilateral lung opacities, L>R, concerning for multifocal pneumonia CXR 6/5 improved aeration of upper lobes, consolidation at L lung base Vancomycin (5/30 - 6/1), cefepime/flagyl (5/30), zosyn (5/31 - present), azithromycin  (5/31 - 6/4).  Plan for 7 day course (last day 6/5) SLP eval -> mild aspiration risk, recommending regular/thin liquids MRSA pcr negative Sputum cx pending collection Blood cx NGTD x5, urine cx no growth Home o2 screen, pt needs 4 L with activity Will need evaluation for oxygen prior to d/c  OOB, IS, flutter, mobilize - will continue abx and hopefully his O2 requirement will continue to gradually improve.  Titrate sats for SpO2 >88%.  Acute COPD exacerbation He was noted to be wheezing. He has Lailah Marcelli history of COPD, not on any oxygen prior to admission. Discharged wit steroids  Volume Overload  Diastolic Heart Failure: elevated BNP, +4 L, start lasix  Acute renal failure Baseline creatinine around 1.2. Noted to be 1.6 at admission. Creatinine improved with IVF.  Essential hypertension His blood pressure medications were placed on hold due to sepsis.Consider resumption if stable in next  24 hrs.  Chronic back pain/history of ankylosing spondylitis Resume his home medications including hydrocodone and Robaxin but at Zitlaly Malson lower dose. He apparently was started on by his pain management specialist recently  but patient did not tolerate it well so it is he is not taking that anymore. Also on Neurontin which will be continued at Klaryssa Fauth lower dose.Add laxatives.  Has buprenorphine on med list, he's not taking this per daughter.  Diabetes mellitus type 2, uncontrolled with hyperglycemia Resume home meds  History of dementia On Aricept, Namenda and Depakote. Behavior is stable.   History of BPH Continue Flomax.  Aortic atherosclerosis  Aspirin and statin.  Macrocytic anemia No drop in hemoglobin noted today. No evidence of overt bleeding. TSH is normal. B12 is noted to be 325. Low normal so we will start him on supplementation. No clearevidence for iron deficiency..  Thrombocytopenia: continue to monitor, mild   Procedures: none  Consultations:  none  Discharge Exam: Vitals:   12/25/19 0800 12/25/19 0915  BP: (!) 186/87 (!) 160/68  Pulse: 66 67  Resp: (!) 22 (!) 23  Temp: 97.6 F (36.4 C)   SpO2: 94% 94%   Wants to go home C/o back pain from bed Wants to go home to his bed Discussed risk of discharge home before optimizing o2 Family at bedside (daughter, wife)  General: No acute distress. Cardiovascular: Heart sounds show Chyrel Taha regular rate, and rhythm.  Lungs: Clear to auscultation bilaterally Abdomen: Soft, nontender, nondistended  Neurological: Alert and oriented 3. Moves all extremities 4 . Cranial nerves II through XII grossly intact. Skin: Warm and dry. No rashes or lesions. Extremities: No clubbing or cyanosis. No edema.   Discharge Instructions   Discharge Instructions    Call MD for:  difficulty breathing, headache or visual disturbances   Complete by: As directed    Call MD for:  extreme fatigue   Complete by: As directed    Call MD for:  hives   Complete by: As directed    Call MD for:  persistant dizziness or light-headedness   Complete by: As directed    Call MD for:  persistant nausea and vomiting   Complete by: As directed    Call  MD for:  redness, tenderness, or signs of infection (pain, swelling, redness, odor or green/yellow discharge around incision site)   Complete by: As directed    Call MD for:  severe uncontrolled pain   Complete by: As directed    Call MD for:  temperature >100.4   Complete by: As directed    Diet - low sodium heart healthy   Complete by: As directed    Discharge instructions   Complete by: As directed    You were seen for pneumonia.  You've gradually improved with antibiotics.   You still have an oxygen requirement.  We started you on steroids for Jo-Anne Kluth COPD exacerbation and will send you home with another 2 days of steroids to complete.   We started lasix for you on the day of discharge.  You should continue your lasix at home and follow up with your primary care provider.  I think you'd benefit from additional time in the hospital to see if we can improve your oxygenation any more, but after Emmaleigh Longo discussion with you, we'll try to get you home in the safest way possible.  Please follow up with your PCP and outpatient providers.  Follow up an outpatient chest x ray.  Return for new, recurrent, or worsening  symptoms.  Please ask your PCP to request records from this hospitalization so they know what was done and what the next steps will be.   Increase activity slowly   Complete by: As directed    No wound care   Complete by: As directed    No wound care   Complete by: As directed      Allergies as of 12/25/2019      Reactions   Codeine Hives, Itching      Medication List    STOP taking these medications   Belbuca 150 MCG Film Generic drug: Buprenorphine HCl     TAKE these medications   Accu-Chek Aviva Plus test strip Generic drug: glucose blood Check sugar 2 times daily. DX E11.8   Accu-Chek Aviva Plus w/Device Kit Check sugar 2 times daily. DX E11.8   Accu-Chek Softclix Lancets lancets Check sugar 2 times daily. DX E11.8   ascorbic acid 500 MG tablet Commonly known as:  VITAMIN C Take 500 mg by mouth daily with lunch.   aspirin EC 81 MG tablet Take 81 mg by mouth daily with breakfast.   Breo Ellipta 100-25 MCG/INH Aepb Generic drug: fluticasone furoate-vilanterol Inhale 1 puff into the lungs daily as needed (shortness of breath).   budesonide-formoterol 80-4.5 MCG/ACT inhaler Commonly known as: SYMBICORT Inhale 2 puffs into the lungs 2 (two) times daily.   citalopram 20 MG tablet Commonly known as: CELEXA TAKE 1 TABLET EVERY DAY What changed: when to take this   divalproex 500 MG 24 hr tablet Commonly known as: DEPAKOTE ER TAKE 2 TABLETS EVERY DAY What changed:   how much to take  when to take this   donepezil 10 MG tablet Commonly known as: ARICEPT TAKE 1 TABLET EVERY MORNING What changed: when to take this   furosemide 20 MG tablet Commonly known as: LASIX Take 1 tablet (20 mg total) by mouth 2 (two) times daily. What changed: when to take this   gabapentin 400 MG capsule Commonly known as: NEURONTIN TAKE 3 CAPSULES AT BEDTIME AND MAY REPEAT DOSE ONE TIME IF NEEDED What changed:   how much to take  how to take this  when to take this  additional instructions   HYDROcodone-acetaminophen 10-325 MG tablet Commonly known as: NORCO Take 1 tablet by mouth See admin instructions. Take one tablet by mouth twice daily, may also take one tablet during the day as needed for pain   LORazepam 0.5 MG tablet Commonly known as: ATIVAN TAKE 1 TABLET (0.5 MG TOTAL) BY MOUTH 2 (TWO) TIMES DAILY AS NEEDED FOR ANXIETY. What changed: when to take this   meloxicam 15 MG tablet Commonly known as: MOBIC Take 15 mg by mouth daily with supper.   memantine 7 MG Cp24 24 hr capsule Commonly known as: NAMENDA XR TAKE 1 CAPSULE (7 MG TOTAL) BY MOUTH EVERY MORNING. What changed: when to take this   metFORMIN 500 MG tablet Commonly known as: GLUCOPHAGE TAKE 1 TABLET TWICE DAILY What changed:   when to take this  additional instructions    methocarbamol 750 MG tablet Commonly known as: ROBAXIN Take 750 mg by mouth in the morning and at bedtime.   mirtazapine 45 MG tablet Commonly known as: REMERON TAKE 1 TABLET AT BEDTIME   nitroGLYCERIN 0.4 MG SL tablet Commonly known as: NITROSTAT Place 0.4 mg under the tongue every 5 (five) minutes as needed for chest pain.   predniSONE 20 MG tablet Commonly known as: DELTASONE Take 2 tablets (40  mg total) by mouth daily with breakfast for 2 days. Start taking on: December 26, 3534   ProAir RespiClick 144 (90 Base) MCG/ACT Aepb Generic drug: Albuterol Sulfate Inhale 1-2 puffs into the lungs every 4 (four) hours as needed.   simvastatin 40 MG tablet Commonly known as: ZOCOR TAKE 1 TABLET 3 TIMES WEEKLY ON MONDAY, WEDNESDAY AND FRIDAY What changed: See the new instructions.   tamsulosin 0.4 MG Caps capsule Commonly known as: FLOMAX TAKE 1 CAPSULE BY MOUTH EVERY DAY What changed: when to take this   Vitamin D3 50 MCG (2000 UT) Tabs Take 2,000 Units by mouth daily with lunch.            Durable Medical Equipment  (From admission, onward)         Start     Ordered   12/25/19 1532  For home use only DME 3 n 1  Once     12/25/19 1533   12/25/19 1531  For home use only DME Walker rolling  Once    Question Answer Comment  Walker: With 5 Inch Wheels   Patient needs Paytin Ramakrishnan walker to treat with the following condition Weakness      12/25/19 1533   12/25/19 1527  DME Oxygen  Once    Comments: Ambulated with pt to measure spO2.  At rest on Room Air, spO2 91%  Ambulating on Room Air, spO2 decreased to 82%.  Pt required 4L supplemental O2 to achieve spO2 of 95% while ambulating in hallway.  Pt's spO2 returned to 97% at rest while on 4L O2.  Question Answer Comment  Length of Need 12 Months   Mode or (Route) Nasal cannula   Liters per Minute 4   Frequency Continuous (stationary and portable oxygen unit needed)   Oxygen conserving device Yes   Oxygen delivery system  Gas      12/25/19 1527         Allergies  Allergen Reactions  . Codeine Hives and Itching   Follow-up Information    [facility] Follow up.   Specialty: Home Health Services Why: for home health services. They will call you in 1-2 days to set up your home health services.  Contact information: [address] [place] [address] [phone]        [facility] Follow up.   Why: walker, bedside toilet and oxygen to be delivered to room prior to [place] information: [address] [place] [address] [phone]            The results of significant diagnostics from this hospitalization (including imaging, microbiology, ancillary and laboratory) are listed below for reference.    Significant Diagnostic Studies: CT ANGIO CHEST PE W OR WO CONTRAST  Result Date: 12/19/2019 CLINICAL DATA:  74 year old male with concern for pulmonary embolism. EXAM: CT ANGIOGRAPHY CHEST WITH CONTRAST TECHNIQUE: Multidetector CT imaging of the chest was performed using the standard protocol during bolus administration of intravenous contrast. Multiplanar CT image reconstructions and MIPs were obtained to evaluate the vascular anatomy. CONTRAST:  28m OMNIPAQUE IOHEXOL 350 MG/ML SOLN COMPARISON:  Chest radiograph dated 12/19/2019. FINDINGS: Cardiovascular: There is no cardiomegaly or pericardial effusion. Coronary vascular calcification primarily involving the LAD and RCA. There is mild atherosclerotic calcification of the thoracic aorta. Evaluation of the pulmonary arteries is limited due to respiratory motion artifact. No large or central pulmonary artery embolus identified. Mediastinum/Nodes: No hilar or mediastinal adenopathy. The esophagus is  grossly unremarkable. No mediastinal fluid collection. Lungs/Pleura: There is Breandan People large area of consolidative change in the left lower lobe most concerning for pneumonia. Clinical correlation  and follow-up to resolution recommended. Smaller area of consolidation involving the right lung base noted. There is background of emphysema. Patchy areas of interstitial coarsening/density involving the upper lobes may represent progression of disease or pneumonia. No large pleural effusion or pneumothorax. The central airways are patent. Upper Abdomen: Fatty liver. Musculoskeletal: Degenerative changes of the spine. No acute osseous pathology. Findings of ankylosing spondylitis. Spinal stimulator wire over the midthoracic spine. Review of the MIP images confirms the above findings. IMPRESSION: 1. No CT evidence of central pulmonary artery embolus. 2. Bilateral lower lobe pneumonia, left greater right. Clinical correlation and follow-up to resolution recommended. 3. Aortic Atherosclerosis (ICD10-I70.0) and Emphysema (ICD10-J43.9). Electronically Signed   By: Anner Crete M.D.   On: 12/19/2019 19:31   DG CHEST PORT 1 VIEW  Result Date: 12/25/2019 CLINICAL DATA:  Hypoxia. EXAM: PORTABLE CHEST 1 VIEW COMPARISON:  12/23/2019 FINDINGS: There has been some improvement in aeration of the UPPER lobes. There is persistent dense consolidation at the LEFT lung base and associated small effusion. Focal opacity at the RIGHT lung base may represent developing infiltrate or atelectasis. Stable position of spinal stimulator. IMPRESSION: 1. Improved aeration of the UPPER lobes. 2. Persistent significant consolidation at the LEFT lung base. Electronically Signed   By: Nolon Nations M.D.   On: 12/25/2019 14:04   DG CHEST PORT 1 VIEW  Result Date: 12/23/2019 CLINICAL DATA:  Hypoxia. EXAM: PORTABLE CHEST 1 VIEW COMPARISON:  December 22, 2019. FINDINGS: Stable cardiomediastinal silhouette. No pneumothorax is noted. Stable left perihilar and basilar opacity is noted concerning for pneumonia with probable small pleural effusion. Stable right basilar opacity is noted as well. Bony thorax is unremarkable. IMPRESSION: Stable  bilateral lung opacities, left greater than right, concerning for multifocal pneumonia. Probable small left pleural effusion. Electronically Signed   By: Marijo Conception M.D.   On: 12/23/2019 11:56   DG CHEST PORT 1 VIEW  Result Date: 12/22/2019 CLINICAL DATA:  Shortness of breath EXAM: PORTABLE CHEST 1 VIEW COMPARISON:  12/19/2019 FINDINGS: Reticular opacity on the left more than right with pneumonia by recent CT. The opacification has increased. Small left pleural effusion. No visible pneumothorax. Borderline heart size. IMPRESSION: Progression of left more than right pneumonia with probable small left pleural effusion. Electronically Signed   By: Monte Fantasia M.D.   On: 12/22/2019 11:10   DG Chest Port 1 View  Result Date: 12/19/2019 CLINICAL DATA:  Altered mental status with fever. EXAM: PORTABLE CHEST 1 VIEW COMPARISON:  04/27/2019 and prior radiographs FINDINGS: LEFT LOWER lung airspace disease/consolidation noted worrisome for pneumonia. No definite pleural effusion or pneumothorax. Cardiomediastinal silhouette is unremarkable. Thoracic cord stimulator again noted. IMPRESSION: LEFT LOWER lung airspace disease/consolidation worrisome for pneumonia. Radiographic follow-up to resolution recommended. Electronically Signed   By: Margarette Canada M.D.   On: 12/19/2019 16:35    Microbiology: Recent Results (from the past 240 hour(s))  Blood Culture (routine x 2)     Status: None   Collection Time: 12/19/19  3:51 PM   Specimen: BLOOD  Result Value Ref Range Status   Specimen Description BLOOD BLOOD LEFT FOREARM  Final   Special Requests   Final    BOTTLES DRAWN AEROBIC AND ANAEROBIC Blood Culture adequate volume   Culture   Final    NO GROWTH 5 DAYS Performed at [facility],  [address] [place], [place] [address]    Report Status 12/24/2019 FINAL  Final  SARS Coronavirus 2 by RT PCR (hospital order, performed in [facility] hospital lab) Nasopharyngeal Nasopharyngeal Swab     Status:  None   Collection Time: 12/19/19  3:52 PM   Specimen: Nasopharyngeal Swab  Result Value Ref Range Status   SARS Coronavirus 2 NEGATIVE NEGATIVE Final    Comment: (NOTE) SARS-CoV-2 target nucleic acids are NOT DETECTED. The SARS-CoV-2 RNA is generally detectable in upper and lower respiratory specimens during the acute phase of infection. The lowest concentration of SARS-CoV-2 viral copies this assay can detect is 250 copies / mL. Jacobus Colvin negative result does not preclude SARS-CoV-2 infection and should not be used as the sole basis for treatment or other patient management decisions.  Kealey Kemmer negative result may occur with improper specimen collection / handling, submission of specimen other than nasopharyngeal swab, presence of viral mutation(s) within the areas targeted by this assay, and inadequate number of viral copies (<250 copies / mL). Marchel Foote negative result must be combined with clinical observations, patient history, and epidemiological information. Fact Sheet for Patients:   StrictlyIdeas.no Fact Sheet for Healthcare Providers: BankingDealers.co.za This test is not yet approved or cleared  by the [place] FDA and has been authorized for detection and/or diagnosis of SARS-CoV-2 by FDA under an Emergency Use Authorization (EUA).  This EUA will remain in effect (meaning this test can be used) for the duration of the COVID-19 declaration under Section 564(b)(1) of the Act, 21 U.S.C. section 360bbb-3(b)(1), unless the authorization is terminated or revoked sooner. Performed at [facility], [place] [address], [place], [place] [address]   Blood Culture (routine x 2)     Status: None   Collection Time: 12/19/19  4:09 PM   Specimen: BLOOD  Result Value Ref Range Status   Specimen Description BLOOD RIGHT ANTECUBITAL  Final   Special Requests   Final    BOTTLES DRAWN AEROBIC AND ANAEROBIC Blood Culture adequate volume   Culture   Final     NO GROWTH 5 DAYS Performed at [facility], [place] [address], [place], [place] [address]    Report Status 12/24/2019 FINAL  Final  Urine culture     Status: None   Collection Time: 12/19/19  5:59 PM   Specimen: In/Out Cath Urine  Result Value Ref Range Status   Specimen Description IN/OUT CATH URINE  Final   Special Requests NONE  Final   Culture   Final    NO GROWTH Performed at [facility], [place] [address], [place], [place] [address]    Report Status 12/20/2019 FINAL  Final  MRSA PCR Screening     Status: None   Collection Time: 12/22/19  5:14 PM   Specimen: Nasopharyngeal  Result Value Ref Range Status   MRSA by PCR NEGATIVE NEGATIVE Final    Comment:        The GeneXpert MRSA Assay (FDA approved for NASAL specimens only), is one component of Mckenze Slone comprehensive MRSA colonization surveillance program. It is not intended to diagnose MRSA infection nor to guide or monitor treatment for MRSA infections. Performed at [facility], [place] [address] [place], [place] [address]      Labs: Basic Metabolic Panel: Recent Labs  Lab 12/20/19 0348 12/22/19 0618 12/23/19 0255 12/24/19 0434 12/25/19 0358  NA 137 138 139 138 135  K 4.3 3.5 3.1* 4.4 4.0  CL 103 105 103  102 101  CO2 '24 24 26 23 24  ' GLUCOSE 149* 119* 112* 174* 172*  BUN 26* '18 14 18 20  ' CREATININE 1.30* 0.93 0.92 1.04 1.00  CALCIUM 8.1* 8.2* 8.4* 8.6* 8.3*  MG  --   --  2.0 2.2 2.0  PHOS  --   --  2.9 3.6 3.9   Liver Function Tests: Recent Labs  Lab 12/19/19 1556 12/23/19 0255 12/24/19 0434 12/25/19 0358  AST 29 35 44* 26  ALT 12 37 68* 54*  ALKPHOS 25* 37* 51 43  BILITOT 0.5 0.6 0.8 0.7  PROT 5.3* 5.6* 5.9* 5.4*  ALBUMIN 2.6* 2.4* 2.6* 2.4*   No results for input(s): LIPASE, AMYLASE in the last 168 hours. No results for input(s): AMMONIA in the last 168 hours. CBC: Recent Labs  Lab 12/19/19 1556 12/19/19 1556 12/20/19 0348 12/22/19 0618 12/23/19 0255 12/24/19 0434  12/25/19 0358  WBC 6.8   < > 9.9 8.3 7.1 6.1 5.5  NEUTROABS 6.1  --   --   --  5.7 5.2 3.9  HGB 11.6*   < > 11.0* 10.4* 10.4* 11.5* 11.4*  HCT 36.1*   < > 33.9* 31.4* 31.7* 35.2* 33.9*  MCV 104.0*   < > 103.0* 101.0* 100.0 99.7 98.0  PLT 142*   < > 130* 114* 125* 135* 156   < > = values in this interval not displayed.   Cardiac Enzymes: No results for input(s): CKTOTAL, CKMB, CKMBINDEX, TROPONINI in the last 168 hours. BNP: BNP (last 3 results) Recent Labs    12/19/19 2313 12/25/19 1221  BNP 674.3* 557.0*    ProBNP (last 3 results) No results for input(s): PROBNP in the last 8760 hours.  CBG: Recent Labs  Lab 12/24/19 2328 12/25/19 0401 12/25/19 0611 12/25/19 0801 12/25/19 1155  GLUCAP 181* 165* 140* 116* 169*       Signed:  Fayrene Helper MD.  [facility] 12/25/2019, 4:08 PM

## 2019-12-27 ENCOUNTER — Telehealth: Payer: Self-pay

## 2019-12-27 NOTE — Telephone Encounter (Signed)
Transition Care Management Follow-up Telephone Call  Date of discharge and from where: [facility]  How have you been since you were released from the hospital? Patient is doing much better since he has been released from the hospital.  Any questions or concerns? No   Items Reviewed:  Did the pt receive and understand the discharge instructions provided? Yes   Medications obtained and verified? Yes   Any new allergies since your discharge? No   Dietary orders reviewed? Yes  Do you have support at home? Yes   Functional Questionnaire: (I = Independent and D = Dependent) ADLs: I  Bathing/Dressing- I  Meal Prep- I  Eating- I  Maintaining continence- I  Transferring/Ambulation- I  Managing Meds- I  Follow up appointments reviewed:   PCP Hospital f/u appt confirmed? Patient wants to want until he gets stronger before coming in for an appointment. Will call back at a later date to schedule.   [facility] f/u appt confirmed? N/A  Are transportation arrangements needed? No   If their condition worsens, is the pt aware to call PCP or go to the Emergency Dept.? Yes  Was the patient provided with contact information for the PCP's office or ED? Yes  Was to pt encouraged to call back with questions or concerns? Yes

## 2019-12-27 NOTE — Telephone Encounter (Signed)
Noted, will discuss at hospital follow up. 

## 2019-12-27 NOTE — Telephone Encounter (Signed)
1st attempt- Left message on voicemail to return my call- need to complete TCM and schedule hospital follow up visit.  

## 2019-12-28 ENCOUNTER — Encounter: Payer: Self-pay | Admitting: Neurology

## 2019-12-30 ENCOUNTER — Other Ambulatory Visit: Payer: Self-pay | Admitting: Internal Medicine

## 2019-12-30 NOTE — Telephone Encounter (Signed)
Last filled 11/18/2019... please advise

## 2019-12-31 ENCOUNTER — Telehealth: Payer: Self-pay

## 2019-12-31 NOTE — Telephone Encounter (Signed)
noted 

## 2019-12-31 NOTE — Telephone Encounter (Signed)
Diane nurse with [facility]  said that pt came home with all services of [facility]. Today pt asked Diane to listen to his lungs. Diane did so and said lungs sounded good and pt said that was all he wanted done and then declined all [place] services. Pt is no longer on O2 during the day. pulse ox today started at 90% and recked was 95%. pts wife has pulse ox and will monitor pulse ox at home. Pt is on 2L of O2 at night. FYI to Avie Echevaria NP.

## 2020-01-03 ENCOUNTER — Other Ambulatory Visit: Payer: Self-pay | Admitting: Internal Medicine

## 2020-01-03 ENCOUNTER — Telehealth: Payer: Self-pay | Admitting: Internal Medicine

## 2020-01-03 NOTE — Telephone Encounter (Signed)
Yes, he is fine to come in

## 2020-01-03 NOTE — Telephone Encounter (Signed)
Patient's daughter called today to schedule Hospital Follow up  Patient was in the hospital 5/30 for [facility]. Did screening questions with daughter., patient is currently not having any symptoms. Also had neg covid test  I wanted to make sure he was still okay to come into the office for visit, given reason for hospital stay    Thanks

## 2020-01-06 ENCOUNTER — Ambulatory Visit (INDEPENDENT_AMBULATORY_CARE_PROVIDER_SITE_OTHER)
Admission: RE | Admit: 2020-01-06 | Discharge: 2020-01-06 | Disposition: A | Discharge status: Home / Self Care | Payer: Medicare HMO | Source: Ambulatory Visit | Attending: Internal Medicine | Admitting: Internal Medicine

## 2020-01-06 ENCOUNTER — Ambulatory Visit (INDEPENDENT_AMBULATORY_CARE_PROVIDER_SITE_OTHER): Payer: Medicare HMO | Admitting: Internal Medicine

## 2020-01-06 ENCOUNTER — Other Ambulatory Visit: Payer: Self-pay

## 2020-01-06 ENCOUNTER — Encounter: Payer: Self-pay | Admitting: Internal Medicine

## 2020-01-06 VITALS — BP 134/78 | HR 76 | Temp 97.9°F | Wt 179.0 lb

## 2020-01-06 DIAGNOSIS — A419 Sepsis, unspecified organism: Secondary | ICD-10-CM

## 2020-01-06 DIAGNOSIS — R6521 Severe sepsis with septic shock: Secondary | ICD-10-CM

## 2020-01-06 DIAGNOSIS — J9601 Acute respiratory failure with hypoxia: Secondary | ICD-10-CM

## 2020-01-06 DIAGNOSIS — L89621 Pressure ulcer of left heel, stage 1: Secondary | ICD-10-CM

## 2020-01-06 DIAGNOSIS — L89156 Pressure-induced deep tissue damage of sacral region: Secondary | ICD-10-CM

## 2020-01-06 DIAGNOSIS — J189 Pneumonia, unspecified organism: Secondary | ICD-10-CM | POA: Diagnosis not present

## 2020-01-06 DIAGNOSIS — R21 Rash and other nonspecific skin eruption: Secondary | ICD-10-CM | POA: Diagnosis not present

## 2020-01-06 LAB — CBC
HCT: 39.2 % (ref 39.0–52.0)
Hemoglobin: 13.4 g/dL (ref 13.0–17.0)
MCHC: 34 g/dL (ref 30.0–36.0)
MCV: 97.8 fl (ref 78.0–100.0)
Platelets: 214 10*3/uL (ref 150.0–400.0)
RBC: 4.01 Mil/uL — ABNORMAL LOW (ref 4.22–5.81)
RDW: 13.7 % (ref 11.5–15.5)
WBC: 8.1 10*3/uL (ref 4.0–10.5)

## 2020-01-06 LAB — COMPREHENSIVE METABOLIC PANEL
ALT: 16 U/L (ref 0–53)
AST: 16 U/L (ref 0–37)
Albumin: 4.2 g/dL (ref 3.5–5.2)
Alkaline Phosphatase: 50 U/L (ref 39–117)
BUN: 16 mg/dL (ref 6–23)
CO2: 34 mEq/L — ABNORMAL HIGH (ref 19–32)
Calcium: 9.5 mg/dL (ref 8.4–10.5)
Chloride: 98 mEq/L (ref 96–112)
Creatinine, Ser: 1.19 mg/dL (ref 0.40–1.50)
GFR: 59.4 mL/min — ABNORMAL LOW (ref >60.00)
Glucose, Bld: 154 mg/dL — ABNORMAL HIGH (ref 70–99)
Potassium: 4.5 mEq/L (ref 3.5–5.1)
Sodium: 137 mEq/L (ref 135–145)
Total Bilirubin: 0.4 mg/dL (ref 0.2–1.2)
Total Protein: 7.1 g/dL (ref 6.0–8.3)

## 2020-01-06 MED ORDER — MUPIROCIN 2 % EX OINT: 1.0000 "application " | TOPICAL_OINTMENT | Freq: Two times a day (BID) | CUTANEOUS | 0 refills | Status: DC

## 2020-01-06 NOTE — Patient Instructions (Signed)
Community-Acquired Pneumonia, Adult Pneumonia is an infection of the lungs. It causes swelling in the airways of the lungs. Mucus and fluid may also build up inside the airways. One type of pneumonia can happen while a person is in a hospital. A different type can happen when a person is not in a hospital (community-acquired pneumonia).  What are the causes?  This condition is caused by germs (viruses, bacteria, or fungi). Some types of germs can be passed from one person to another. This can happen when you breathe in droplets from the cough or sneeze of an infected person. What increases the risk? You are more likely to develop this condition if you:  Have a long-term (chronic) disease, such as: ? Chronic obstructive pulmonary disease (COPD). ? Asthma. ? Cystic fibrosis. ? Congestive heart failure. ? Diabetes. ? Kidney disease.  Have HIV.  Have sickle cell disease.  Have had your spleen removed.  Do not take good care of your teeth and mouth (poor dental hygiene).  Have a medical condition that increases the risk of breathing in droplets from your own mouth and nose.  Have a weakened body defense system (immune system).  Are a smoker.  Travel to areas where the germs that cause this illness are common.  Are around certain animals or the places they live. What are the signs or symptoms?  A dry cough.  A wet (productive) cough.  Fever.  Sweating.  Chest pain. This often happens when breathing deeply or coughing.  Fast breathing or trouble breathing.  Shortness of breath.  Shaking chills.  Feeling tired (fatigue).  Muscle aches. How is this treated? Treatment for this condition depends on many things. Most adults can be treated at home. In some cases, treatment must happen in a hospital. Treatment may include:  Medicines given by mouth or through an IV tube.  Being given extra oxygen.  Respiratory therapy. In rare cases, treatment for very bad pneumonia  may include:  Using a machine to help you breathe.  Having a procedure to remove fluid from around your lungs. Follow these instructions at home: Medicines  Take over-the-counter and prescription medicines only as told by your doctor. ? Only take cough medicine if you are losing sleep.  If you were prescribed an antibiotic medicine, take it as told by your doctor. Do not stop taking the antibiotic even if you start to feel better. General instructions   Sleep with your head and neck raised (elevated). You can do this by sleeping in a recliner or by putting a few pillows under your head.  Rest as needed. Get at least 8 hours of sleep each night.  Drink enough water to keep your pee (urine) pale yellow.  Eat a healthy diet that includes plenty of vegetables, fruits, whole grains, low-fat dairy products, and lean protein.  Do not use any products that contain nicotine or tobacco. These include cigarettes, e-cigarettes, and chewing tobacco. If you need help quitting, ask your doctor.  Keep all follow-up visits as told by your doctor. This is important. How is this prevented? A shot (vaccine) can help prevent pneumonia. Shots are often suggested for:  People older than 76 years of age.  People older than 76 years of age who: ? Are having cancer treatment. ? Have long-term (chronic) lung disease. ? Have problems with their body's defense system. You may also prevent pneumonia if you take these actions:  Get the flu (influenza) shot every year.  Go to the dentist as   often as told.  Wash your hands often. If you cannot use soap and water, use hand sanitizer. Contact a doctor if:  You have a fever.  You lose sleep because your cough medicine does not help. Get help right away if:  You are short of breath and it gets worse.  You have more chest pain.  Your sickness gets worse. This is very serious if: ? You are an older adult. ? Your body's defense system is weak.  You  cough up blood. Summary  Pneumonia is an infection of the lungs.  Most adults can be treated at home. Some will need treatment in a hospital.  Drink enough water to keep your pee pale yellow.  Get at least 8 hours of sleep each night. This information is not intended to replace advice given to you by your health care provider. Make sure you discuss any questions you have with your health care provider. Document Revised: 10/28/2018 Document Reviewed: 03/05/2018 Elsevier Patient Education  2020 Elsevier Inc.  

## 2020-01-06 NOTE — Progress Notes (Signed)
Subjective:    Patient ID: Alexander Booth, male    DOB: 22-Jan-1946, 74 y.o.   MRN: [id number]  HPI  Patient presents to the clinic today for TCM hospital follow-up.  He went to the ER 6/5 with complaint of weakness, fatigue, fever and shortness of breath.  Chest x-ray was concerning for left-sided infiltrate.  He did have an elevated lactic acid.  He was admitted treated with broad-spectrum antibiotics, steroids and diuretics.  He had a persistent oxygen requirement via nasal cannula.  He was eventually discharged on 6/11.  They wanted him to go to a SNF but he refused and was discharged home with home health.  Since discharge, his daughter reports he has been using oxygen as needed during the day, but wears nightly. Daughter is concerned about possible bed sores to his buttocks and left heel. She has noticed discoloration but denies drainage or odor. He denies fever, chills or body aches.   Review of Systems  Past Medical History:  Diagnosis Date  . COPD (chronic obstructive pulmonary disease) ([place])   . Depression   . Erectile dysfunction    He's had a hx of   . Hypercholesterolemia   . Hypertension    Essential  . Lumbosacral disc disease    with chronic low back pain, has implanted nerve stimulator  . Peripheral neuropathy   . Presenile dementia   . Type 2 diabetes mellitus ([place]) 2010   takes oral meds    Current Outpatient Medications  Medication Sig Dispense Refill  . Accu-Chek Softclix Lancets lancets Check sugar 2 times daily. DX E11.8 200 each 3  . Albuterol Sulfate (PROAIR RESPICLICK) 308 (90 Base) MCG/ACT AEPB Inhale 1-2 puffs into the lungs every 4 (four) hours as needed. (Patient not taking: Reported on 12/19/2019) 1 each 11  . ascorbic acid (VITAMIN C) 500 MG tablet Take 500 mg by mouth daily with lunch.    Marland Kitchen aspirin EC 81 MG tablet Take 81 mg by mouth daily with breakfast.    . Blood Glucose Monitoring Suppl (ACCU-CHEK AVIVA PLUS) w/Device KIT Check sugar 2 times  daily. DX E11.8 1 kit 12  . budesonide-formoterol (SYMBICORT) 80-4.5 MCG/ACT inhaler Inhale 2 puffs into the lungs 2 (two) times daily.    . Cholecalciferol (VITAMIN D3) 50 MCG (2000 UT) TABS Take 2,000 Units by mouth daily with lunch.    . citalopram (CELEXA) 20 MG tablet TAKE 1 TABLET EVERY DAY (Patient taking differently: Take 20 mg by mouth daily with breakfast. ) 90 tablet 1  . divalproex (DEPAKOTE ER) 500 MG 24 hr tablet TAKE 2 TABLETS EVERY DAY (Patient taking differently: Take 500 mg by mouth 2 (two) times daily with a meal. ) 180 tablet 1  . donepezil (ARICEPT) 10 MG tablet TAKE 1 TABLET EVERY MORNING (Patient taking differently: Take 10 mg by mouth daily with breakfast. ) 90 tablet 1  . fluticasone furoate-vilanterol (BREO ELLIPTA) 100-25 MCG/INH AEPB Inhale 1 puff into the lungs daily as needed (shortness of breath).    . furosemide (LASIX) 20 MG tablet Take 1 tablet (20 mg total) by mouth 2 (two) times daily. (Patient taking differently: Take 20 mg by mouth 2 (two) times daily with breakfast and lunch. ) 180 tablet 3  . gabapentin (NEURONTIN) 400 MG capsule TAKE 3 CAPSULES AT BEDTIME AND MAY REPEAT DOSE ONE TIME IF NEEDED 270 capsule 1  . glucose blood (ACCU-CHEK AVIVA PLUS) test strip Check sugar 2 times daily. DX E11.8 200 each 3  .  HYDROcodone-acetaminophen (NORCO) 10-325 MG tablet Take 1 tablet by mouth See admin instructions. Take one tablet by mouth twice daily, may also take one tablet during the day as needed for pain    . LORazepam (ATIVAN) 0.5 MG tablet Take 1 tablet (0.5 mg total) by mouth 2 (two) times daily as needed. 45 tablet 0  . meloxicam (MOBIC) 15 MG tablet Take 15 mg by mouth daily with supper.     . memantine (NAMENDA XR) 7 MG CP24 24 hr capsule TAKE 1 CAPSULE (7 MG TOTAL) BY MOUTH EVERY MORNING. (Patient taking differently: Take 7 mg by mouth daily with breakfast. ) 90 capsule 1  . metFORMIN (GLUCOPHAGE) 500 MG tablet TAKE 1 TABLET TWICE DAILY (Patient taking  differently: Take 500 mg by mouth See admin instructions. Take one tablet (500 mg) by mouth twice daily - with lunch and with supper) 180 tablet 1  . methocarbamol (ROBAXIN) 750 MG tablet Take 750 mg by mouth in the morning and at bedtime.    . mirtazapine (REMERON) 45 MG tablet TAKE 1 TABLET AT BEDTIME (Patient taking differently: Take 45 mg by mouth at bedtime. ) 90 tablet 0  . nitroGLYCERIN (NITROSTAT) 0.4 MG SL tablet Place 0.4 mg under the tongue every 5 (five) minutes as needed for chest pain.     . simvastatin (ZOCOR) 40 MG tablet TAKE 1 TABLET 3 TIMES WEEKLY ON MONDAY, WEDNESDAY AND FRIDAY (Patient taking differently: Take 40 mg by mouth every Monday, Wednesday, and Friday. evening) 39 tablet 0  . tamsulosin (FLOMAX) 0.4 MG CAPS capsule TAKE 1 CAPSULE BY MOUTH EVERY DAY (Patient taking differently: Take 0.4 mg by mouth daily with supper. ) 90 capsule 3   No current facility-administered medications for this visit.    Allergies  Allergen Reactions  . Codeine Hives and Itching    Family History  Problem Relation Age of Onset  . Multiple myeloma Mother   . Diabetes Mother   . Cancer Sister        lymphoma  . Diabetes Sister   . Stroke Neg Hx     Social History   Socioeconomic History  . Marital status: Married    Spouse name: Vaughan Basta  . Number of children: 3  . Years of education: 70  . Highest education level: Not on file  Occupational History  . Occupation: retired    Comment: retired  Tobacco Use  . Smoking status: Current Every Day Smoker    Packs/day: 1.00    Years: 40.00    Pack years: 40.00  . Smokeless tobacco: Never Used  Substance and Sexual Activity  . Alcohol use: No    Alcohol/week: 0.0 standard drinks  . Drug use: No  . Sexual activity: Not on file  Other Topics Concern  . Not on file  Social History Narrative   Patient is right handed   Patient resides with wife,consumes 2-3 cups of caffeine daily   Social Determinants of Health   Financial  Resource Strain:   . Difficulty of Paying Living Expenses:   Food Insecurity:   . Worried About Charity fundraiser in the Last Year:   . Arboriculturist in the Last Year:   Transportation Needs:   . Film/video editor (Medical):   Marland Kitchen Lack of Transportation (Non-Medical):   Physical Activity:   . Days of Exercise per Week:   . Minutes of Exercise per Session:   Stress:   . Feeling of Stress :  Social Connections:   . Frequency of Communication with Friends and Family:   . Frequency of Social Gatherings with Friends and Family:   . Attends Religious Services:   . Active Member of Clubs or Organizations:   . Attends Archivist Meetings:   Marland Kitchen Marital Status:   Intimate Partner Violence:   . Fear of Current or Ex-Partner:   . Emotionally Abused:   Marland Kitchen Physically Abused:   . Sexually Abused:      Constitutional: Pt reports fatigue. Denies fever, malaise, headache or abrupt weight changes.  HEENT: Denies eye pain, eye redness, ear pain, ringing in the ears, wax buildup, runny nose, nasal congestion, bloody nose, or sore throat. Respiratory: Pt reports cough and SOB. Denies difficulty breathing, or sputum production.   Cardiovascular: Denies chest pain, chest tightness, palpitations or swelling in the hands or feet.  Musculoskeletal: Pt reports generalized weakness. Denies decrease in range of motion, muscle pain or joint pain and swelling.  Skin: Pt reports lesion to left heel, top of buttocks.   No other specific complaints in a complete review of systems (except as listed in HPI above).     Objective:   Physical Exam  BP 134/78   Pulse 76   Temp 97.9 F (36.6 C) (Temporal)   Wt 179 lb (81.2 kg)   SpO2 90%   BMI 24.28 kg/m   Wt Readings from Last 3 Encounters:  12/19/19 186 lb (84.4 kg)  06/01/19 186 lb (84.4 kg)  05/10/19 192 lb 12.8 oz (87.5 kg)    General: Appears  his stated age, chronically ill appearing,  in NAD. Skin: Stage 1 pressure ulcer to  left heel, no s/s of infection. Shearing noted to top of gluteal cleft.  Neck:  Neck supple, trachea midline. No masses, lumps or thyromegaly present.  Cardiovascular: Normal rate and rhythm. S1,S2 noted.  No murmur, rubs or gallops noted. No JVD or BLE edema. Arterial changes noted to BLE. Pulmonary/Chest: Increased effort. Ronchi noted in the LLL. No respiratory distress.  Abdomen: Soft and nontender. Normal bowel sounds. No distention or masses noted. Liver, spleen and kidneys non palpable. Musculoskeletal: Gait slow, steady with use of walker. Neurological: Alert and oriented.    BMET    Component Value Date/Time   NA 135 12/25/2019 0358   NA 141 10/11/2014 1122   K 4.0 12/25/2019 0358   CL 101 12/25/2019 0358   CO2 24 12/25/2019 0358   GLUCOSE 172 (H) 12/25/2019 0358   BUN 20 12/25/2019 0358   BUN 11 10/11/2014 1122   CREATININE 1.00 12/25/2019 0358   CREATININE 0.72 03/26/2013 1608   CALCIUM 8.3 (L) 12/25/2019 0358   GFRNONAA >60 12/25/2019 0358   GFRAA >60 12/25/2019 0358    Lipid Panel     Component Value Date/Time   CHOL 201 (H) 04/27/2019 1116   TRIG 378.0 (H) 04/27/2019 1116   HDL 33.50 (L) 04/27/2019 1116   CHOLHDL 6 04/27/2019 1116   VLDL 75.6 (H) 04/27/2019 1116   LDLCALC 97 03/20/2017 1016    CBC    Component Value Date/Time   WBC 5.5 12/25/2019 0358   RBC 3.46 (L) 12/25/2019 0358   HGB 11.4 (L) 12/25/2019 0358   HCT 33.9 (L) 12/25/2019 0358   PLT 156 12/25/2019 0358   MCV 98.0 12/25/2019 0358   MCH 32.9 12/25/2019 0358   MCHC 33.6 12/25/2019 0358   RDW 13.2 12/25/2019 0358   RDW 13.5 10/11/2014 1122   LYMPHSABS 0.9 12/25/2019 0358  LYMPHSABS 1.3 10/11/2014 1122   MONOABS 0.5 12/25/2019 0358   EOSABS 0.0 12/25/2019 0358   EOSABS 0.0 10/11/2014 1122   BASOSABS 0.0 12/25/2019 0358   BASOSABS 0.0 10/11/2014 1122    Hgb A1C Lab Results  Component Value Date   HGBA1C 7.1 (H) 12/24/2019           Assessment & Plan:   [facility]  follow-up for CAP, sepsis:  Hospital notes, labs and imaging reviewed Chest xray today CBC and CMET Continue current meds  Pressure Ulcer Left Heel:  Avoid pressure RX for Bactroban BID until healed  Friction Shearing, Buttocks:  Use A&D or Buttpaste 3 x day until resolved  Will follow up after labs and xray  Return precautions discussed Webb Silversmith, NP This visit occurred during the SARS-CoV-2 public health emergency.  Safety protocols were in place, including screening questions prior to the visit, additional usage of staff PPE, and extensive cleaning of exam room while observing appropriate contact time as indicated for disinfecting solutions.

## 2020-01-07 ENCOUNTER — Other Ambulatory Visit: Payer: Self-pay | Admitting: Internal Medicine

## 2020-01-10 ENCOUNTER — Other Ambulatory Visit: Payer: Self-pay | Admitting: Internal Medicine

## 2020-01-21 ENCOUNTER — Encounter: Payer: Self-pay | Admitting: Internal Medicine

## 2020-01-21 ENCOUNTER — Other Ambulatory Visit: Payer: Self-pay | Admitting: Internal Medicine

## 2020-01-21 ENCOUNTER — Telehealth: Payer: Self-pay

## 2020-01-21 DIAGNOSIS — F0391 Unspecified dementia with behavioral disturbance: Secondary | ICD-10-CM

## 2020-01-21 DIAGNOSIS — G4700 Insomnia, unspecified: Secondary | ICD-10-CM

## 2020-01-21 DIAGNOSIS — F03918 Unspecified dementia, unspecified severity, with other behavioral disturbance: Secondary | ICD-10-CM

## 2020-01-21 MED ORDER — PREDNISONE 10 MG PO TABS: ORAL_TABLET | ORAL | 0 refills | Status: DC

## 2020-01-21 MED ORDER — DOXYCYCLINE HYCLATE 100 MG PO TABS
100.0000 mg | ORAL_TABLET | Freq: Two times a day (BID) | ORAL | 0 refills | Status: DC
Start: 2020-01-21 — End: 2020-01-31

## 2020-01-21 NOTE — Telephone Encounter (Signed)
I had reached out to the patient. I sent in Prednisone and Doxycycline. They are to update me if no improvement.

## 2020-01-21 NOTE — Telephone Encounter (Signed)
I saw Doxycycline and prednisone was sent to pharmacy and did not know if that was to resolve this access nurse note or does pt need to be contacted.

## 2020-01-21 NOTE — Telephone Encounter (Signed)
[facility] - Client TELEPHONE ADVICE RECORD [facility] Patient Name: Alexander Booth Gender: Male DOB: 1945/12/10 Age: 74 Y 80 M 9 D Return Phone Number: [phone] (Primary), [phone] (Secondary) [place] [address] Client [facility] - Client Client Site [place] - Day Physician Arnette Norris- MD Contact Type Call Who Is Calling Patient / Member / Family / Caregiver Call Type Triage / Clinical Caller Name Alexander Booth Relationship To Patient Daughter Return Phone Number [phone] (Primary) Chief Complaint CHEST PAIN (>=21 years) - pain, pressure, heaviness or tightness Reason for Call Symptomatic / Request for [place] is transferring a pt who has had double pneumonia recently 5/30 and today he is feeling worse today, coughing, and his chest hurts. Translation No No Triage Reason Patient declined Nurse Assessment Nurse: Ronnald Ramp, RN, Miranda Date/Time (Eastern Time): 01/21/2020 12:37:47 PM Confirm and document reason for call. If symptomatic, describe symptoms. ---Caller states she is just wanting to have her father seen by [place]. She is sure if she knows the situation she will see him. She states he has a cough and pain from coughing. She said the office said [name] does not have any appointments but would check with her when she comes back at 2pm. Has the patient had close contact with a person known or suspected to have the novel coronavirus illness OR traveled / lives in area with major community spread (including international travel) in the last 14 days from the onset of symptoms? * If Asymptomatic, screen for exposure and travel within the last 14 days. ---No Does the patient have any new or worsening symptoms? ---Yes Will a triage be completed? ---No Select reason for no triage. ---Patient declined Please document clinical  information provided and list any resource used. ---Triage offered and caller refused. Caller states she had been on hold for 20 min. She did not understand why she had been sent to the triage nurse in the first place. RN explained that the office wanted her to speak to a nurse because of the patient's symptoms. RN also explained that she would need to know what to do if they were not able to get the patient an appt. Caller still refused triage. Guidelines Guideline Title Affirmed Question Affirmed Notes Nurse Date/Time (Eastern Time) PLEASE NOTE: All timestamps contained within this report are represented as [place] Standard Time. CONFIDENTIALTY NOTICE: This fax transmission is intended only for the addressee. It contains information that is legally privileged, confidential or otherwise protected from use or disclosure. If you are not the intended recipient, you are strictly prohibited from reviewing, disclosing, copying using or disseminating any of this information or taking any action in reliance on or regarding this information. If you have received this fax in error, please notify us immediately by telephone so that we can arrange for its return to [place]. Phone: [phone], Toll-Free: [phone], Fax: [phone] Page: 2 of 2 Call Id: [id number] [place]. Time Eilene Ghazi Time) Disposition Final User 01/21/2020 12:36:06 PM Send to Urgent Vassie Loll 01/21/2020 12:44:47 PM Clinical Call Yes Ronnald Ramp, RN, Jeannetta Nap

## 2020-01-24 DIAGNOSIS — J449 Chronic obstructive pulmonary disease, unspecified: Secondary | ICD-10-CM | POA: Diagnosis not present

## 2020-01-26 NOTE — Progress Notes (Signed)
Assessment/Plan:    1.  Parkinsonism  -initially wondered about FTD given that records report  very long history of memory change that long preceded the parkinsonian symptoms.  However, I was not so convinced about said memory change in a neurodegenerative fashion.  If he had had Alzheimer's disease since 2004, he really should look very differently than he does today.  In addition, daughter was very clear today that behavioral symptoms were really a personality issue that was longstanding and nothing new (and patient does not deny this).  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  We discussed treatment options as well as prognostic indicators.  Patient education was provided.  -We discussed that it used to be thought that levodopa would increase risk of melanoma but now it is believed that Parkinsons itself likely increases risk of melanoma. he is to get regular skin checks.  -  We talked about medication options as well as potential future surgical options.  We talked about safety in the home.  -We decided to add carbidopa/levodopa 25/100.  1/2 tab tid x 1 wk, then 1/2 in am & noon & 1 at night for a week, then 1/2 in am &1 at noon &night for a week, then 1 po tid.  Risks, benefits, side effects and alternative therapies were discussed.  The opportunity to ask questions was given and they were answered to the best of my ability.  The patient expressed understanding and willingness to follow the outlined treatment protocols.  -Strongly encouraged physical therapy, but it was declined.  -We discussed community resources in the area including patient support groups and community exercise programs for PD and pt education was provided to the patient.   2.  Tobacco abuse  -Currently smoking 1 packs/day    - Patient was informed of the dangers of tobacco abuse including stroke, cancer, and MI, as well as benefits of tobacco  cessation.  - Patient not willing to quit at this time.  - Approximately 3 mins were spent counseling patient cessation techniques. We discussed various methods to help quit smoking, including deciding on a date to quit, joining a support group, pharmacological agents- nicotine gum/patch/lozenges, chantix,   - I will reassess his progress at future visit.  3.  Memory loss  -we will schedule neurocognitive testing.  I am not convinced of a neurodegenerative dementia.  As previous, he had a PET scan that was apparently concerning in 2004 for possible Alzheimer's disease, but this should have been clear today if he had Alzheimer's disease almost 20 years later.  4.  Follow-up 4 to 6 months, sooner should new neurologic issues arise.   Subjective:   Rye Decoste [facility] was seen today in the movement disorders clinic for neurologic consultation at the request of Erline Levine, MD.  The consultation is for the evaluation of Parkinsons Disease.  Pt previously under the care of [facility] neurology.  This patient is accompanied in the office by his daughter who supplements the history.Those records have been reviewed.  Was last seen by Dr. Rexene Alberts in March, 2017 and was seen her for many years (since 2014 and saw Dr. Erling Cruz since 2004).  No mention is made of Parkinson's disease.  However, patient has had progressive memory loss since 2004.  In fact, the patient had a PET scan in 2004 that was apparently concerning for Alzheimer's disease.  With the course of time, the patient  developed progressive gait disturbance, personality change, progressive memory loss, falls, chronic back pain.  Patient has been on numerous medications from neurology including mirtazapine, Depakote, trazodone, Prozac, donepezil, Namenda and at the last visit in 2017, recommendations were made to get a consultation with geriatric psychiatry. They saw someone one time but they don't know who.  Pt has been following with Dr. Vertell Limber and there has  more recently been a concern for the development of Parkinsons Disease.  He presents here for evaluation   Specific Symptoms:  Tremor: Yes.  , started in the R hand a year ago and more recently in the L hand Family hx of similar:  No. Voice: "its deeper and rougher" Sleep: trouble staying asleep (doesn't attribute to bladder)  Vivid Dreams:  No.  Acting out dreams:  No. Wet Pillows: No. Postural symptoms:  Yes.  , walker x 6 months  Falls?  Yes.   - 3-4 times this year - daughter thinks due to shuffling feet and "leg weakness" Bradykinesia symptoms: shuffling gait, difficulty getting out of a chair and difficulty regaining balance Loss of smell:  No. Loss of taste:  No. Urinary Incontinence:  No. Difficulty Swallowing:  No. Handwriting, micrographia: Yes.   Trouble with ADL's:  No.  Trouble buttoning clothing: No. Depression:  No. but admits to "moody" Memory changes:  Yes.   - lives with wife - pt still able to do finances in home.  Daughter does pill box x 1-2 years because of number of meds but he is able to self medicate.  Doesn't drive x 6 months  - "I just don't wanna drive."  Re: personality - pts daughter states that he has always been moody and no personality change really over the years.  Daughter states that sometimes aggressive but this is not a change - always been like that.   Hallucinations:  "I'm not sure"  visual distortions: No. N/V:  No. Lightheaded:  Yes.    Syncope: No. Diplopia:  No. Dyskinesia:  No. Prior exposure to reglan/antipsychotics: No.   PREVIOUS MEDICATIONS: none to date  ALLERGIES:   Allergies  Allergen Reactions   Codeine Hives and Itching    CURRENT MEDICATIONS:  Current Outpatient Medications  Medication Instructions   Accu-Chek Softclix Lancets lancets Check sugar 2 times daily. DX E11.8   Albuterol Sulfate (PROAIR RESPICLICK) 756 (90 Base) MCG/ACT AEPB 1-2 puffs, Inhalation, Every 4 hours PRN   ascorbic acid (VITAMIN C) 500 mg,  Oral, Daily with lunch   aspirin EC 81 mg, Oral, Daily with breakfast   Blood Glucose Monitoring Suppl (ACCU-CHEK AVIVA PLUS) w/Device KIT Check sugar 2 times daily. DX E11.8   budesonide-formoterol (SYMBICORT) 80-4.5 MCG/ACT inhaler 2 puffs, Inhalation, 2 times daily   citalopram (CELEXA) 20 MG tablet TAKE 1 TABLET EVERY DAY   divalproex (DEPAKOTE ER) 500 MG 24 hr tablet TAKE 2 TABLETS EVERY DAY   donepezil (ARICEPT) 10 MG tablet TAKE 1 TABLET EVERY MORNING   fluticasone furoate-vilanterol (BREO ELLIPTA) 100-25 MCG/INH AEPB 1 puff, Inhalation, Daily PRN   furosemide (LASIX) 20 mg, Oral, 2 times daily   gabapentin (NEURONTIN) 400 MG capsule TAKE 3 CAPSULES AT BEDTIME AND MAY REPEAT DOSE ONE TIME IF NEEDED   glucose blood (ACCU-CHEK AVIVA PLUS) test strip Check sugar 2 times daily. DX E11.8   HYDROcodone-acetaminophen (NORCO) 10-325 MG tablet 1 tablet, Oral, See admin instructions, Take one tablet by mouth twice daily, may also take one tablet during the day as needed for pain  LORazepam (ATIVAN) 0.5 mg, Oral, 2 times daily PRN   meloxicam (MOBIC) 15 mg, Oral, Daily with supper   memantine (NAMENDA XR) 7 mg, Oral,  Every morning - 10a   metFORMIN (GLUCOPHAGE) 500 MG tablet TAKE 1 TABLET TWICE DAILY   methocarbamol (ROBAXIN) 750 mg, Oral, 2 times daily   mirtazapine (REMERON) 45 MG tablet TAKE 1 TABLET AT BEDTIME   mupirocin ointment (BACTROBAN) 2 % 1 application, Topical, 2 times daily   nitroGLYCERIN (NITROSTAT) 0.4 mg, Sublingual, Every 5 min PRN   simvastatin (ZOCOR) 40 MG tablet TAKE 1 TABLET 3 TIMES WEEKLY ON MONDAY, WEDNESDAY AND FRIDAY   tamsulosin (FLOMAX) 0.4 MG CAPS capsule TAKE 1 CAPSULE BY MOUTH EVERY DAY   Vitamin D3 2,000 Units, Oral, Daily with lunch    Objective:   VITALS:   Vitals:   01/31/20 0858  BP: 126/71  Pulse: 77  SpO2: 93%  Weight: 188 lb (85.3 kg)  Height: 6' (1.829 m)    GEN:  The patient appears stated age and is in NAD. HEENT:   Normocephalic, atraumatic.  The mucous membranes are moist. The superficial temporal arteries are without ropiness or tenderness. CV:  RRR Lungs:  CTAB Neck/HEME:  There are no carotid bruits bilaterally.  Neurological examination:  Orientation: The patient is alert and oriented x3.  Cranial nerves: There is good facial symmetry. Extraocular muscles are intact. The visual fields are full to confrontational testing. The speech is fluent and clear. Soft palate rises symmetrically and there is no tongue deviation. Hearing is decreased to conversational tone. Sensation: Sensation is intact to light and pinprick throughout (facial, trunk, extremities). Vibration is intact at the bilateral big toe. There is no extinction with double simultaneous stimulation. There is no sensory dermatomal level identified. Motor: Strength is 5/5 in the bilateral upper and lower extremities.   Shoulder shrug is equal and symmetric.  There is no pronator drift. Deep tendon reflexes: Deep tendon reflexes are 2/4 at the bilateral biceps, triceps, brachioradialis, patella and achilles. Plantar responses are downgoing bilaterally.  Movement examination: Tone: There is mild increased tone in the RUE Abnormal movements: there is R>LUE rest tremor Coordination:  There is decremation with RAM's, with any form of RAMS, including alternating supination and pronation of the forearm, hand opening and closing, finger taps, heel taps and toe taps, on the R, overall mild Gait and Station: The patient has minimal difficulty arising out of a deep-seated chair without the use of the hands. The patient's stride length is decreased and he shuffles.  He is flexed at the waist.  He turns en bloc.   I have reviewed and interpreted the following labs independently   Chemistry      Component Value Date/Time   NA 137 01/06/2020 1103   NA 141 10/11/2014 1122   K 4.5 01/06/2020 1103   CL 98 01/06/2020 1103   CO2 34 (H) 01/06/2020 1103   BUN  16 01/06/2020 1103   BUN 11 10/11/2014 1122   CREATININE 1.19 01/06/2020 1103   CREATININE 0.72 03/26/2013 1608      Component Value Date/Time   CALCIUM 9.5 01/06/2020 1103   ALKPHOS 50 01/06/2020 1103   AST 16 01/06/2020 1103   ALT 16 01/06/2020 1103   BILITOT 0.4 01/06/2020 1103   BILITOT 0.4 10/11/2014 1122      Lab Results  Component Value Date   TSH 1.216 12/21/2019   Lab Results  Component Value Date   WBC 8.1 01/06/2020  HGB 13.4 01/06/2020   HCT 39.2 01/06/2020   MCV 97.8 01/06/2020   PLT 214.0 01/06/2020     Total time spent on today's visit was 70 minutes, including both face-to-face time and nonface-to-face time.  Time included that spent on review of records (prior notes available to me/labs/imaging if pertinent), discussing treatment and goals, answering patient's questions and coordinating care.  Cc:  [name], NP

## 2020-01-31 ENCOUNTER — Encounter: Payer: Self-pay | Admitting: Neurology

## 2020-01-31 ENCOUNTER — Other Ambulatory Visit: Payer: Self-pay

## 2020-01-31 ENCOUNTER — Ambulatory Visit: Payer: Medicare HMO | Admitting: Neurology

## 2020-01-31 VITALS — BP 126/71 | HR 77 | Ht 72.0 in | Wt 188.0 lb

## 2020-01-31 DIAGNOSIS — R413 Other amnesia: Secondary | ICD-10-CM | POA: Diagnosis not present

## 2020-01-31 DIAGNOSIS — Z716 Tobacco abuse counseling: Secondary | ICD-10-CM | POA: Diagnosis not present

## 2020-01-31 DIAGNOSIS — G20 Parkinson's disease: Secondary | ICD-10-CM | POA: Diagnosis not present

## 2020-01-31 MED ORDER — CARBIDOPA-LEVODOPA 25-100 MG PO TABS
1.0000 | ORAL_TABLET | Freq: Three times a day (TID) | ORAL | 1 refills | Status: DC
Start: 2020-01-31 — End: 2020-05-26

## 2020-01-31 NOTE — Patient Instructions (Signed)
Start Carbidopa Levodopa as follows:  Take 1/2 tablet three times daily, at least 30 minutes before meals (approximately 7am/11am/4pm), for one week  Then take 1/2 tablet in the morning, 1/2 tablet in the afternoon, 1 tablet in the evening, at least 30 minutes before meals, for one week  Then take 1/2 tablet in the morning, 1 tablet in the afternoon, 1 tablet in the evening, at least 30 minutes before meals, for one week  Then take 1 tablet three times daily at 7am/11am/4pm, at least 30 minutes before meals   As a reminder, carbidopa/levodopa can be taken at the same time as a carbohydrate, but we like to have you take your pill either 30 minutes before a protein source or 1 hour after as protein can interfere with carbidopa/levodopa absorption.   You have been referred for a neurocognitive evaluation in our office.   The evaluation has two parts.   . The first part of the evaluation is a clinical interview with the neuropsychologist (Dr. Melvyn Novas or Dr. Nicole Kindred). Please bring someone with you to this appointment if possible, as it is helpful for the doctor to hear from both you and another adult who knows you well.   . The second part of the evaluation is testing with the doctor's technician Hinton Dyer or Maudie Mercury). The testing includes a variety of tasks- mostly question-and-answer, some paper-and-pencil. There is nothing you need to do to prepare for this appointment, but having a good night's sleep prior to the testing, taking medications as you normally would, and bringing eyeglasses and hearing aids (if you wear them), is advised. Please make sure that you wear a mask to the appointment.  Please note: We have to reserve several hours of the neuropsychologist's time and the psychometrician's time for your evaluation appointment. As such, please note that there is a No-Show fee of $100. If you are unable to attend any of your appointments, please contact our office as soon as possible to reschedule.

## 2020-02-04 ENCOUNTER — Other Ambulatory Visit: Payer: Self-pay | Admitting: Internal Medicine

## 2020-02-04 NOTE — Telephone Encounter (Signed)
Last filled 12/30/2019... please advise

## 2020-02-24 DIAGNOSIS — J449 Chronic obstructive pulmonary disease, unspecified: Secondary | ICD-10-CM | POA: Diagnosis not present

## 2020-02-28 ENCOUNTER — Other Ambulatory Visit: Payer: Self-pay | Admitting: Internal Medicine

## 2020-02-28 DIAGNOSIS — G4700 Insomnia, unspecified: Secondary | ICD-10-CM

## 2020-02-28 DIAGNOSIS — F0391 Unspecified dementia with behavioral disturbance: Secondary | ICD-10-CM

## 2020-02-28 DIAGNOSIS — F32A Depression, unspecified: Secondary | ICD-10-CM

## 2020-02-28 DIAGNOSIS — F03918 Unspecified dementia, unspecified severity, with other behavioral disturbance: Secondary | ICD-10-CM

## 2020-03-01 ENCOUNTER — Encounter: Payer: Medicare HMO | Admitting: Psychology

## 2020-03-08 ENCOUNTER — Encounter: Payer: Medicare HMO | Admitting: Psychology

## 2020-03-17 ENCOUNTER — Encounter: Payer: Self-pay | Admitting: Internal Medicine

## 2020-03-17 DIAGNOSIS — M545 Low back pain: Secondary | ICD-10-CM | POA: Diagnosis not present

## 2020-03-17 DIAGNOSIS — G20 Parkinson's disease: Secondary | ICD-10-CM | POA: Diagnosis not present

## 2020-03-17 DIAGNOSIS — M5416 Radiculopathy, lumbar region: Secondary | ICD-10-CM | POA: Diagnosis not present

## 2020-03-18 ENCOUNTER — Other Ambulatory Visit: Payer: Self-pay | Admitting: Internal Medicine

## 2020-03-20 MED ORDER — AZITHROMYCIN 250 MG PO TABS
ORAL_TABLET | ORAL | 0 refills | Status: DC
Start: 2020-03-20 — End: 2020-04-20

## 2020-03-25 ENCOUNTER — Other Ambulatory Visit: Payer: Self-pay | Admitting: Internal Medicine

## 2020-03-26 DIAGNOSIS — J449 Chronic obstructive pulmonary disease, unspecified: Secondary | ICD-10-CM | POA: Diagnosis not present

## 2020-03-28 NOTE — Telephone Encounter (Signed)
Last filled 02/04/2020... please advise

## 2020-04-16 DIAGNOSIS — M25512 Pain in left shoulder: Secondary | ICD-10-CM | POA: Diagnosis not present

## 2020-04-20 ENCOUNTER — Other Ambulatory Visit: Payer: Self-pay

## 2020-04-20 ENCOUNTER — Ambulatory Visit (INDEPENDENT_AMBULATORY_CARE_PROVIDER_SITE_OTHER): Payer: Medicare HMO | Admitting: Internal Medicine

## 2020-04-20 ENCOUNTER — Encounter: Payer: Self-pay | Admitting: Internal Medicine

## 2020-04-20 VITALS — BP 128/76 | HR 50 | Temp 97.9°F | Wt 191.0 lb

## 2020-04-20 DIAGNOSIS — M25512 Pain in left shoulder: Secondary | ICD-10-CM | POA: Diagnosis not present

## 2020-04-20 DIAGNOSIS — R29898 Other symptoms and signs involving the musculoskeletal system: Secondary | ICD-10-CM

## 2020-04-20 DIAGNOSIS — W19XXXA Unspecified fall, initial encounter: Secondary | ICD-10-CM | POA: Diagnosis not present

## 2020-04-20 NOTE — Progress Notes (Signed)
Subjective:    Patient ID: Alexander Booth, male    DOB: 1946/03/17, 74 y.o.   MRN: [id number]  HPI  Pt presents to the clinic today with c/o left shoulder pain. This started 3 weeks ago after a fall. He fell backwards landing on his left shoulder. He describes the pain as achy. He denies numbness or tingling of his LUE but has noted decreased range of motion and weakness. He did go to [facility] for evaluation of the same. Xray was done, unremarkable per patients wife. He has taken Melocixam, Gabapentin, Methocarbamol and Gabapentin with minimal relief of symptoms.  He reports he has been getting steroid injections in his left shoulder intermittently for years.  Review of Systems      Past Medical History:  Diagnosis Date  . COPD (chronic obstructive pulmonary disease) ([place])   . Depression   . Erectile dysfunction    He's had a hx of   . Hypercholesterolemia   . Hypertension    Essential  . Lumbosacral disc disease    with chronic low back pain, has implanted nerve stimulator  . Peripheral neuropathy   . Presenile dementia   . Type 2 diabetes mellitus ([place]) 2010   takes oral meds    Current Outpatient Medications  Medication Sig Dispense Refill  . LORazepam (ATIVAN) 0.5 MG tablet TAKE 1 TABLET (0.5 MG TOTAL) BY MOUTH 2 (TWO) TIMES DAILY AS NEEDED. 45 tablet 0  . Accu-Chek Softclix Lancets lancets Check sugar 2 times daily. DX E11.8 200 each 3  . Albuterol Sulfate (PROAIR RESPICLICK) 163 (90 Base) MCG/ACT AEPB Inhale 1-2 puffs into the lungs every 4 (four) hours as needed. 1 each 11  . ascorbic acid (VITAMIN C) 500 MG tablet Take 500 mg by mouth daily with lunch.    Marland Kitchen aspirin EC 81 MG tablet Take 81 mg by mouth daily with breakfast.    . azithromycin (ZITHROMAX) 250 MG tablet Take 2 tabs today, then 1 tab daily x 4 days 6 tablet 0  . Blood Glucose Monitoring Suppl (ACCU-CHEK AVIVA PLUS) w/Device KIT Check sugar 2 times daily. DX E11.8 1 kit 12  . budesonide-formoterol  (SYMBICORT) 80-4.5 MCG/ACT inhaler Inhale 2 puffs into the lungs 2 (two) times daily.    . carbidopa-levodopa (SINEMET IR) 25-100 MG tablet Take 1 tablet by mouth 3 (three) times daily. 270 tablet 1  . Cholecalciferol (VITAMIN D3) 50 MCG (2000 UT) TABS Take 2,000 Units by mouth daily with lunch.    . citalopram (CELEXA) 20 MG tablet TAKE 1 TABLET EVERY DAY 90 tablet 1  . divalproex (DEPAKOTE ER) 500 MG 24 hr tablet TAKE 2 TABLETS EVERY DAY 180 tablet 1  . donepezil (ARICEPT) 10 MG tablet TAKE 1 TABLET EVERY MORNING 90 tablet 1  . fluticasone furoate-vilanterol (BREO ELLIPTA) 100-25 MCG/INH AEPB Inhale 1 puff into the lungs daily as needed (shortness of breath).    . furosemide (LASIX) 20 MG tablet Take 1 tablet (20 mg total) by mouth 2 (two) times daily. (Patient taking differently: Take 20 mg by mouth 2 (two) times daily with breakfast and lunch. ) 180 tablet 3  . gabapentin (NEURONTIN) 400 MG capsule TAKE 3 CAPSULES AT BEDTIME AND MAY REPEAT DOSE ONE TIME IF NEEDED (Patient taking differently: TAKE 2 CAPSULES AT BEDTIME AND MAY REPEAT DOSE ONE TIME IF NEEDED) 270 capsule 1  . glucose blood (ACCU-CHEK AVIVA PLUS) test strip Check sugar 2 times daily. DX E11.8 200 each 3  .  HYDROcodone-acetaminophen (NORCO) 10-325 MG tablet Take 1 tablet by mouth See admin instructions. Take one tablet by mouth twice daily, may also take one tablet during the day as needed for pain    . meloxicam (MOBIC) 15 MG tablet Take 15 mg by mouth daily with supper.     . memantine (NAMENDA XR) 7 MG CP24 24 hr capsule TAKE 1 CAPSULE (7 MG TOTAL) BY MOUTH EVERY MORNING. (Patient taking differently: Take 7 mg by mouth daily with breakfast. ) 90 capsule 1  . metFORMIN (GLUCOPHAGE) 500 MG tablet TAKE 1 TABLET TWICE DAILY (Patient taking differently: Take 500 mg by mouth See admin instructions. Take one tablet (500 mg) by mouth twice daily - with lunch and with supper) 180 tablet 1  . methocarbamol (ROBAXIN) 750 MG tablet Take 750 mg  by mouth in the morning and at bedtime.    . mirtazapine (REMERON) 45 MG tablet TAKE 1 TABLET AT BEDTIME (Patient taking differently: Take 45 mg by mouth at bedtime. ) 90 tablet 0  . mupirocin ointment (BACTROBAN) 2 % Apply 1 application topically 2 (two) times daily. 22 g 0  . nitroGLYCERIN (NITROSTAT) 0.4 MG SL tablet Place 0.4 mg under the tongue every 5 (five) minutes as needed for chest pain.     . simvastatin (ZOCOR) 40 MG tablet TAKE 1 TABLET 3 TIMES WEEKLY ON MONDAY, WEDNESDAY AND FRIDAY 39 tablet 0  . tamsulosin (FLOMAX) 0.4 MG CAPS capsule TAKE 1 CAPSULE BY MOUTH EVERY DAY (Patient taking differently: Take 0.4 mg by mouth daily with supper. ) 90 capsule 3   No current facility-administered medications for this visit.    Allergies  Allergen Reactions  . Codeine Hives and Itching    Family History  Problem Relation Age of Onset  . Multiple myeloma Mother   . Diabetes Mother   . Diabetes Sister   . Lymphoma Sister   . Stroke Neg Hx     Social History   Socioeconomic History  . Marital status: Married    Spouse name: Vaughan Basta  . Number of children: 3  . Years of education: 21  . Highest education level: Not on file  Occupational History  . Occupation: retired    Comment: retired  Tobacco Use  . Smoking status: Current Every Day Smoker    Packs/day: 1.00    Years: 40.00    Pack years: 40.00  . Smokeless tobacco: Never Used  Vaping Use  . Vaping Use: Never used  Substance and Sexual Activity  . Alcohol use: No    Alcohol/week: 0.0 standard drinks  . Drug use: No  . Sexual activity: Not on file  Other Topics Concern  . Not on file  Social History Narrative   Patient is right handed   Patient resides with wife,consumes 2-3 cups of caffeine daily   Social Determinants of Health   Financial Resource Strain:   . Difficulty of Paying Living Expenses: Not on file  Food Insecurity:   . Worried About Charity fundraiser in the Last Year: Not on file  . Ran Out of  Food in the Last Year: Not on file  Transportation Needs:   . Lack of Transportation (Medical): Not on file  . Lack of Transportation (Non-Medical): Not on file  Physical Activity:   . Days of Exercise per Week: Not on file  . Minutes of Exercise per Session: Not on file  Stress:   . Feeling of Stress : Not on file  Social Connections:   .  Frequency of Communication with Friends and Family: Not on file  . Frequency of Social Gatherings with Friends and Family: Not on file  . Attends Religious Services: Not on file  . Active Member of Clubs or Organizations: Not on file  . Attends Archivist Meetings: Not on file  . Marital Status: Not on file  Intimate Partner Violence:   . Fear of Current or Ex-Partner: Not on file  . Emotionally Abused: Not on file  . Physically Abused: Not on file  . Sexually Abused: Not on file     Constitutional: Denies fever, malaise, fatigue, headache or abrupt weight changes.  Respiratory: Denies difficulty breathing, shortness of breath, cough or sputum production.   Cardiovascular: Denies chest pain, chest tightness, palpitations or swelling in the hands or feet.  Musculoskeletal: Pt reports left shoulder pain, decreased ROM and weakness. Denies difficulty with gait, muscle pain or joint swelling.  Skin: Denies redness, rashes, lesions or ulcercations.  Neurological: Denies numbness or tingling of the LUE.    No other specific complaints in a complete review of systems (except as listed in HPI above).  Objective:   Physical Exam BP 128/76   Pulse (!) 50   Temp 97.9 F (36.6 C) (Temporal)   Wt 191 lb (86.6 kg)   SpO2 95%   BMI 25.90 kg/m   Wt Readings from Last 3 Encounters:  01/31/20 188 lb (85.3 kg)  01/06/20 179 lb (81.2 kg)  12/19/19 186 lb (84.4 kg)    General: Appears his stated age, well developed, well nourished in NAD. Skin: Warm, dry and intact. No bruising or abrasions noted. Cardiovascular: Bradycardic. Radial pulse  2+ bilaterally. Pulmonary/Chest: Normal effort and diminished vesicular breath sounds. No respiratory distress.  Musculoskeletal: Normal flexion of the cervical spine. Decreased extension and rotation of the cervical spine, R>L. No bony tenderness noted over the spine. Decreased external rotation of the left shoulder. Normal internal rotation of the left shoulder. Pain with palpation over the anterior shoulder and over the left scapula, without bony deformity. Strength 4/5 LUE, 5/5 RUE. Positive drop can test on the left. Neurological: Alert and oriented. Resting tremor noted of bilateral hands R>L. Sensation intact to BUE. Decreased fine motor coordination bilaterally L>R.   BMET    Component Value Date/Time   NA 137 01/06/2020 1103   NA 141 10/11/2014 1122   K 4.5 01/06/2020 1103   CL 98 01/06/2020 1103   CO2 34 (H) 01/06/2020 1103   GLUCOSE 154 (H) 01/06/2020 1103   BUN 16 01/06/2020 1103   BUN 11 10/11/2014 1122   CREATININE 1.19 01/06/2020 1103   CREATININE 0.72 03/26/2013 1608   CALCIUM 9.5 01/06/2020 1103   GFRNONAA >60 12/25/2019 0358   GFRAA >60 12/25/2019 0358    Lipid Panel     Component Value Date/Time   CHOL 201 (H) 04/27/2019 1116   TRIG 378.0 (H) 04/27/2019 1116   HDL 33.50 (L) 04/27/2019 1116   CHOLHDL 6 04/27/2019 1116   VLDL 75.6 (H) 04/27/2019 1116   LDLCALC 97 03/20/2017 1016    CBC    Component Value Date/Time   WBC 8.1 01/06/2020 1103   RBC 4.01 (L) 01/06/2020 1103   HGB 13.4 01/06/2020 1103   HCT 39.2 01/06/2020 1103   PLT 214.0 01/06/2020 1103   MCV 97.8 01/06/2020 1103   MCH 32.9 12/25/2019 0358   MCHC 34.0 01/06/2020 1103   RDW 13.7 01/06/2020 1103   RDW 13.5 10/11/2014 1122   LYMPHSABS 0.9  12/25/2019 0358   LYMPHSABS 1.3 10/11/2014 1122   MONOABS 0.5 12/25/2019 0358   EOSABS 0.0 12/25/2019 0358   EOSABS 0.0 10/11/2014 1122   BASOSABS 0.0 12/25/2019 0358   BASOSABS 0.0 10/11/2014 1122    Hgb A1C Lab Results  Component Value Date    HGBA1C 7.1 (H) 12/24/2019            Assessment & Plan:  Left Shoulder Pain s/p Fall:  Will request records from [facility] No indication to repeat xray at this time Suspect labral/rotator cuff tear He declines sling at this time Decadron 10 mg IM today Continue Methocarbamol, Gabapentin, Meloxicam and Hydrocodone Encouraged him to try ice instead of heat  Will follow up after MRI, will consider PT as well. Return precautions discussed  Webb Silversmith, NP This visit occurred during the SARS-CoV-2 public health emergency.  Safety protocols were in place, including screening questions prior to the visit, additional usage of staff PPE, and extensive cleaning of exam room while observing appropriate contact time as indicated for disinfecting solutions.

## 2020-04-20 NOTE — Patient Instructions (Signed)
Shoulder Exercises Ask your health care provider which exercises are safe for you. Do exercises exactly as told by your health care provider and adjust them as directed. It is normal to feel mild stretching, pulling, tightness, or discomfort as you do these exercises. Stop right away if you feel sudden pain or your pain gets worse. Do not begin these exercises until told by your health care provider. Stretching exercises External rotation and abduction This exercise is sometimes called corner stretch. This exercise rotates your arm outward (external rotation) and moves your arm out from your body (abduction). 1. Stand in a doorway with one of your feet slightly in front of the other. This is called a staggered stance. If you cannot reach your forearms to the door frame, stand facing a corner of a room. 2. Choose one of the following positions as told by your health care provider: ? Place your hands and forearms on the door frame above your head. ? Place your hands and forearms on the door frame at the height of your head. ? Place your hands on the door frame at the height of your elbows. 3. Slowly move your weight onto your front foot until you feel a stretch across your chest and in the front of your shoulders. Keep your head and chest upright and keep your abdominal muscles tight. 4. Hold for __________ seconds. 5. To release the stretch, shift your weight to your back foot. Repeat __________ times. Complete this exercise __________ times a day. Extension, standing 1. Stand and hold a broomstick, a cane, or a similar object behind your back. ? Your hands should be a little wider than shoulder width apart. ? Your palms should face away from your back. 2. Keeping your elbows straight and your shoulder muscles relaxed, move the stick away from your body until you feel a stretch in your shoulders (extension). ? Avoid shrugging your shoulders while you move the stick. Keep your shoulder blades tucked  down toward the middle of your back. 3. Hold for __________ seconds. 4. Slowly return to the starting position. Repeat __________ times. Complete this exercise __________ times a day. Range-of-motion exercises Pendulum  1. Stand near a wall or a surface that you can hold onto for balance. 2. Bend at the waist and let your left / right arm hang straight down. Use your other arm to support you. Keep your back straight and do not lock your knees. 3. Relax your left / right arm and shoulder muscles, and move your hips and your trunk so your left / right arm swings freely. Your arm should swing because of the motion of your body, not because you are using your arm or shoulder muscles. 4. Keep moving your hips and trunk so your arm swings in the following directions, as told by your health care provider: ? Side to side. ? Forward and backward. ? In clockwise and counterclockwise circles. 5. Continue each motion for __________ seconds, or for as long as told by your health care provider. 6. Slowly return to the starting position. Repeat __________ times. Complete this exercise __________ times a day. Shoulder flexion, standing  1. Stand and hold a broomstick, a cane, or a similar object. Place your hands a little more than shoulder width apart on the object. Your left / right hand should be palm up, and your other hand should be palm down. 2. Keep your elbow straight and your shoulder muscles relaxed. Push the stick up with your healthy arm to   raise your left / right arm in front of your body, and then over your head until you feel a stretch in your shoulder (flexion). ? Avoid shrugging your shoulder while you raise your arm. Keep your shoulder blade tucked down toward the middle of your back. 3. Hold for __________ seconds. 4. Slowly return to the starting position. Repeat __________ times. Complete this exercise __________ times a day. Shoulder abduction, standing 1. Stand and hold a broomstick,  a cane, or a similar object. Place your hands a little more than shoulder width apart on the object. Your left / right hand should be palm up, and your other hand should be palm down. 2. Keep your elbow straight and your shoulder muscles relaxed. Push the object across your body toward your left / right side. Raise your left / right arm to the side of your body (abduction) until you feel a stretch in your shoulder. ? Do not raise your arm above shoulder height unless your health care provider tells you to do that. ? If directed, raise your arm over your head. ? Avoid shrugging your shoulder while you raise your arm. Keep your shoulder blade tucked down toward the middle of your back. 3. Hold for __________ seconds. 4. Slowly return to the starting position. Repeat __________ times. Complete this exercise __________ times a day. Internal rotation  1. Place your left / right hand behind your back, palm up. 2. Use your other hand to dangle an exercise band, a towel, or a similar object over your shoulder. Grasp the band with your left / right hand so you are holding on to both ends. 3. Gently pull up on the band until you feel a stretch in the front of your left / right shoulder. The movement of your arm toward the center of your body is called internal rotation. ? Avoid shrugging your shoulder while you raise your arm. Keep your shoulder blade tucked down toward the middle of your back. 4. Hold for __________ seconds. 5. Release the stretch by letting go of the band and lowering your hands. Repeat __________ times. Complete this exercise __________ times a day. Strengthening exercises External rotation  1. Sit in a stable chair without armrests. 2. Secure an exercise band to a stable object at elbow height on your left / right side. 3. Place a soft object, such as a folded towel or a small pillow, between your left / right upper arm and your body to move your elbow about 4 inches (10 cm) away  from your side. 4. Hold the end of the exercise band so it is tight and there is no slack. 5. Keeping your elbow pressed against the soft object, slowly move your forearm out, away from your abdomen (external rotation). Keep your body steady so only your forearm moves. 6. Hold for __________ seconds. 7. Slowly return to the starting position. Repeat __________ times. Complete this exercise __________ times a day. Shoulder abduction  1. Sit in a stable chair without armrests, or stand up. 2. Hold a __________ weight in your left / right hand, or hold an exercise band with both hands. 3. Start with your arms straight down and your left / right palm facing in, toward your body. 4. Slowly lift your left / right hand out to your side (abduction). Do not lift your hand above shoulder height unless your health care provider tells you that this is safe. ? Keep your arms straight. ? Avoid shrugging your shoulder while you   do this movement. Keep your shoulder blade tucked down toward the middle of your back. 5. Hold for __________ seconds. 6. Slowly lower your arm, and return to the starting position. Repeat __________ times. Complete this exercise __________ times a day. Shoulder extension 1. Sit in a stable chair without armrests, or stand up. 2. Secure an exercise band to a stable object in front of you so it is at shoulder height. 3. Hold one end of the exercise band in each hand. Your palms should face each other. 4. Straighten your elbows and lift your hands up to shoulder height. 5. Step back, away from the secured end of the exercise band, until the band is tight and there is no slack. 6. Squeeze your shoulder blades together as you pull your hands down to the sides of your thighs (extension). Stop when your hands are straight down by your sides. Do not let your hands go behind your body. 7. Hold for __________ seconds. 8. Slowly return to the starting position. Repeat __________ times.  Complete this exercise __________ times a day. Shoulder row 1. Sit in a stable chair without armrests, or stand up. 2. Secure an exercise band to a stable object in front of you so it is at waist height. 3. Hold one end of the exercise band in each hand. Position your palms so that your thumbs are facing the ceiling (neutral position). 4. Bend each of your elbows to a 90-degree angle (right angle) and keep your upper arms at your sides. 5. Step back until the band is tight and there is no slack. 6. Slowly pull your elbows back behind you. 7. Hold for __________ seconds. 8. Slowly return to the starting position. Repeat __________ times. Complete this exercise __________ times a day. Shoulder press-ups  1. Sit in a stable chair that has armrests. Sit upright, with your feet flat on the floor. 2. Put your hands on the armrests so your elbows are bent and your fingers are pointing forward. Your hands should be about even with the sides of your body. 3. Push down on the armrests and use your arms to lift yourself off the chair. Straighten your elbows and lift yourself up as much as you comfortably can. ? Move your shoulder blades down, and avoid letting your shoulders move up toward your ears. ? Keep your feet on the ground. As you get stronger, your feet should support less of your body weight as you lift yourself up. 4. Hold for __________ seconds. 5. Slowly lower yourself back into the chair. Repeat __________ times. Complete this exercise __________ times a day. Wall push-ups  1. Stand so you are facing a stable wall. Your feet should be about one arm-length away from the wall. 2. Lean forward and place your palms on the wall at shoulder height. 3. Keep your feet flat on the floor as you bend your elbows and lean forward toward the wall. 4. Hold for __________ seconds. 5. Straighten your elbows to push yourself back to the starting position. Repeat __________ times. Complete this exercise  __________ times a day. This information is not intended to replace advice given to you by your health care provider. Make sure you discuss any questions you have with your health care provider. Document Revised: 10/30/2018 Document Reviewed: 08/07/2018 Elsevier Patient Education  2020 [employer]  

## 2020-04-21 ENCOUNTER — Telehealth: Payer: Self-pay | Admitting: Internal Medicine

## 2020-04-21 NOTE — Telephone Encounter (Signed)
I wouldn't know what else to order. Would he be willing to see our sports medicine physician Dr. Lorelei Pont?

## 2020-04-21 NOTE — Telephone Encounter (Signed)
Patient's wife called in stating that [facility] called and stated because patient has spinal cord stimulator they cannot do an MRI, but if she wanted another test to send the order and they can do it. Please advise.

## 2020-04-25 ENCOUNTER — Encounter: Payer: Self-pay | Admitting: Internal Medicine

## 2020-04-25 DIAGNOSIS — J449 Chronic obstructive pulmonary disease, unspecified: Secondary | ICD-10-CM | POA: Diagnosis not present

## 2020-05-03 ENCOUNTER — Other Ambulatory Visit: Payer: Self-pay | Admitting: Internal Medicine

## 2020-05-03 DIAGNOSIS — G4700 Insomnia, unspecified: Secondary | ICD-10-CM

## 2020-05-03 DIAGNOSIS — F0391 Unspecified dementia with behavioral disturbance: Secondary | ICD-10-CM

## 2020-05-03 DIAGNOSIS — F03918 Unspecified dementia, unspecified severity, with other behavioral disturbance: Secondary | ICD-10-CM

## 2020-05-03 NOTE — Telephone Encounter (Signed)
Last filled 03/28/2020-- Lorazepam... please advise

## 2020-05-09 DIAGNOSIS — M7582 Other shoulder lesions, left shoulder: Secondary | ICD-10-CM | POA: Diagnosis not present

## 2020-05-09 DIAGNOSIS — M5412 Radiculopathy, cervical region: Secondary | ICD-10-CM | POA: Diagnosis not present

## 2020-05-26 ENCOUNTER — Other Ambulatory Visit: Payer: Self-pay | Admitting: *Deleted

## 2020-05-26 ENCOUNTER — Other Ambulatory Visit: Payer: Self-pay | Admitting: Internal Medicine

## 2020-05-26 DIAGNOSIS — J449 Chronic obstructive pulmonary disease, unspecified: Secondary | ICD-10-CM | POA: Diagnosis not present

## 2020-05-26 MED ORDER — FUROSEMIDE 20 MG PO TABS: 20.0000 mg | ORAL_TABLET | Freq: Two times a day (BID) | ORAL | 3 refills | Status: DC

## 2020-05-26 NOTE — Telephone Encounter (Signed)
Last office visit 04/20/2020 for fall/shoulder pain/weakness left upper extremity.  No future appointments.  Meloxicam and Methocarbamol are listed as historical mediations.  Ok to refill.

## 2020-05-27 MED ORDER — METHOCARBAMOL 750 MG PO TABS: 750.0000 mg | ORAL_TABLET | Freq: Two times a day (BID) | ORAL | 0 refills | Status: DC

## 2020-05-27 MED ORDER — CARBIDOPA-LEVODOPA 25-100 MG PO TABS: 1.0000 | ORAL_TABLET | Freq: Three times a day (TID) | ORAL | 1 refills | Status: DC

## 2020-05-31 ENCOUNTER — Other Ambulatory Visit: Payer: Self-pay | Admitting: *Deleted

## 2020-05-31 NOTE — Telephone Encounter (Signed)
[place] with [facility] left a voicemail stating that they had faxed over refill request for Meloxicam 15 mg and Mehtocarbamol 750 mg. Jan is requesting that the refills be sent in so that they can send the medication out to the patient in a timely manner. Last office visit 04/20/20 Methocarbamol last refill 05/27/20 #30 Meloxicam last refill 11/03/19 ?

## 2020-06-01 MED ORDER — METHOCARBAMOL 750 MG PO TABS: 750.0000 mg | ORAL_TABLET | Freq: Two times a day (BID) | ORAL | 1 refills | Status: DC

## 2020-06-01 MED ORDER — MELOXICAM 15 MG PO TABS: 15.0000 mg | ORAL_TABLET | Freq: Every day | ORAL | 1 refills | Status: DC

## 2020-06-10 ENCOUNTER — Other Ambulatory Visit: Payer: Self-pay | Admitting: Internal Medicine

## 2020-06-12 NOTE — Telephone Encounter (Signed)
Lorazepam last filled 05/04/2020...Marland Kitchen please advise

## 2020-06-19 DIAGNOSIS — M4807 Spinal stenosis, lumbosacral region: Secondary | ICD-10-CM | POA: Diagnosis not present

## 2020-06-19 DIAGNOSIS — M542 Cervicalgia: Secondary | ICD-10-CM | POA: Diagnosis not present

## 2020-06-19 DIAGNOSIS — M5416 Radiculopathy, lumbar region: Secondary | ICD-10-CM | POA: Diagnosis not present

## 2020-06-21 ENCOUNTER — Other Ambulatory Visit: Payer: Self-pay | Admitting: Neurosurgery

## 2020-06-21 DIAGNOSIS — M542 Cervicalgia: Secondary | ICD-10-CM

## 2020-06-25 DIAGNOSIS — J449 Chronic obstructive pulmonary disease, unspecified: Secondary | ICD-10-CM | POA: Diagnosis not present

## 2020-06-29 ENCOUNTER — Ambulatory Visit
Admission: RE | Admit: 2020-06-29 | Discharge: 2020-06-29 | Disposition: A | Discharge status: Home / Self Care | Payer: Medicare HMO | Source: Ambulatory Visit | Attending: Neurosurgery | Admitting: Neurosurgery

## 2020-06-29 DIAGNOSIS — M47812 Spondylosis without myelopathy or radiculopathy, cervical region: Secondary | ICD-10-CM | POA: Diagnosis not present

## 2020-06-29 DIAGNOSIS — M5021 Other cervical disc displacement,  high cervical region: Secondary | ICD-10-CM | POA: Diagnosis not present

## 2020-06-29 DIAGNOSIS — M2578 Osteophyte, vertebrae: Secondary | ICD-10-CM | POA: Diagnosis not present

## 2020-06-29 DIAGNOSIS — M542 Cervicalgia: Secondary | ICD-10-CM

## 2020-06-29 DIAGNOSIS — Z981 Arthrodesis status: Secondary | ICD-10-CM | POA: Diagnosis not present

## 2020-07-12 ENCOUNTER — Other Ambulatory Visit: Payer: Medicare HMO

## 2020-07-14 ENCOUNTER — Other Ambulatory Visit: Payer: Self-pay | Admitting: Internal Medicine

## 2020-07-14 DIAGNOSIS — G4700 Insomnia, unspecified: Secondary | ICD-10-CM

## 2020-07-14 DIAGNOSIS — F32A Depression, unspecified: Secondary | ICD-10-CM

## 2020-07-14 DIAGNOSIS — F03918 Unspecified dementia, unspecified severity, with other behavioral disturbance: Secondary | ICD-10-CM

## 2020-07-14 DIAGNOSIS — F0391 Unspecified dementia with behavioral disturbance: Secondary | ICD-10-CM

## 2020-07-17 ENCOUNTER — Encounter: Payer: Self-pay | Admitting: Internal Medicine

## 2020-07-18 ENCOUNTER — Other Ambulatory Visit: Payer: Self-pay | Admitting: Internal Medicine

## 2020-07-19 NOTE — Telephone Encounter (Signed)
Last filled 06/13/2020... please advise °

## 2020-07-26 DIAGNOSIS — J449 Chronic obstructive pulmonary disease, unspecified: Secondary | ICD-10-CM | POA: Diagnosis not present

## 2020-07-28 ENCOUNTER — Ambulatory Visit: Payer: Medicare HMO | Admitting: Neurology

## 2020-07-30 ENCOUNTER — Other Ambulatory Visit: Payer: Self-pay | Admitting: Neurology

## 2020-07-31 DIAGNOSIS — M5412 Radiculopathy, cervical region: Secondary | ICD-10-CM | POA: Diagnosis not present

## 2020-07-31 NOTE — Telephone Encounter (Signed)
Rx(s) sent to pharmacy electronically.  

## 2020-08-02 ENCOUNTER — Other Ambulatory Visit: Payer: Self-pay | Admitting: Internal Medicine

## 2020-08-02 DIAGNOSIS — Z6825 Body mass index (BMI) 25.0-25.9, adult: Secondary | ICD-10-CM | POA: Diagnosis not present

## 2020-08-02 DIAGNOSIS — M542 Cervicalgia: Secondary | ICD-10-CM | POA: Diagnosis not present

## 2020-08-02 DIAGNOSIS — R030 Elevated blood-pressure reading, without diagnosis of hypertension: Secondary | ICD-10-CM | POA: Diagnosis not present

## 2020-08-04 NOTE — Telephone Encounter (Signed)
Mirtazapine last filled 02/2019 with no refills please advise  Methocarbamol already filled 06/01/2020 90 day supply with 1 refill

## 2020-08-04 NOTE — Telephone Encounter (Signed)
Can you ask [name] if he is taking this?

## 2020-08-04 NOTE — Telephone Encounter (Signed)
Daughter [name] states pt has been taking at bedtime daily, not sure why it says not been filled since 2020

## 2020-08-23 ENCOUNTER — Ambulatory Visit: Payer: Medicare HMO | Admitting: Neurology

## 2020-08-26 DIAGNOSIS — J449 Chronic obstructive pulmonary disease, unspecified: Secondary | ICD-10-CM | POA: Diagnosis not present

## 2020-09-08 ENCOUNTER — Other Ambulatory Visit: Payer: Self-pay | Admitting: Internal Medicine

## 2020-09-08 NOTE — Telephone Encounter (Signed)
Last filled 07/19/2020... please advise

## 2020-09-13 DIAGNOSIS — Z6825 Body mass index (BMI) 25.0-25.9, adult: Secondary | ICD-10-CM | POA: Diagnosis not present

## 2020-09-13 DIAGNOSIS — M542 Cervicalgia: Secondary | ICD-10-CM | POA: Diagnosis not present

## 2020-09-13 DIAGNOSIS — R030 Elevated blood-pressure reading, without diagnosis of hypertension: Secondary | ICD-10-CM | POA: Diagnosis not present

## 2020-09-13 DIAGNOSIS — G20 Parkinson's disease: Secondary | ICD-10-CM | POA: Diagnosis not present

## 2020-09-13 DIAGNOSIS — M5136 Other intervertebral disc degeneration, lumbar region: Secondary | ICD-10-CM | POA: Diagnosis not present

## 2020-09-13 DIAGNOSIS — M25512 Pain in left shoulder: Secondary | ICD-10-CM | POA: Diagnosis not present

## 2020-09-19 ENCOUNTER — Other Ambulatory Visit: Payer: Self-pay | Admitting: Neurosurgery

## 2020-09-22 NOTE — H&P (Signed)
Patient ID:   [id number] Patient: Alexander Booth  Date of Birth: [birth year] Visit Type: Office Visit   Date: 09/13/2020 10:45 AM Provider: Marchia Meiers. Vertell Limber MD   This 75 year old male presents for Discuss SCS surgery.  HISTORY OF PRESENT ILLNESS: 1.  Discuss SCS surgery  Alexander Booth is a 75 year old male who is well known to this office, presents today accompanied by his daughter after being seen earlier today by Simeon Craft, PA. The patient has an extensive surgical and pain management history that includes a Chemical engineer spinal cord stimulator implanted in 2016 by Dr. Vertell Limber. Despite surgical and medical management, the patient continues to suffer from severe intractable pain. He is here today to discuss removal of his spinal cord stimulator. He feels as though the battery is causing him pain and the device has not been effective at relieving his symptoms. He reports continued low back pain that radiates into his legs.      Medical/Surgical/Interim History Reviewed, no change.  Last detailed document date:12/13/2019.     Family History: Reviewed, no changes.  Last detailed document date:12/13/2019.   Social History: Reviewed, no changes. Last detailed document date: 12/13/2019.    MEDICATIONS: (added, continued or stopped this visit) Started Medication Directions Instruction Stopped  Aricept 10 mg tablet    03/08/2018 Breo Ellipta 100 mcg-25 mcg/dose powder for inhalation    05/03/2019 citalopram 20 mg tablet     divalproex ER 500 mg tablet,extended release 24 hr    01/03/2018 Flovent HFA 110 mcg/actuation aerosol inhaler    01/02/2019 furosemide 20 mg tablet    01/26/2018 gabapentin 400 mg capsule    07/18/2020 hydrocodone 10 mg-acetaminophen 325 mg tablet take 1 tablet by oral route  every 6 hours as needed for pain  09/13/2020 07/18/2020 hydrocodone 10 mg-acetaminophen 325 mg tablet take 1 tablet by oral route  every 6 hours  as needed for pain (DNF 08/17/20) DNF UNTIL 08/17/20 09/13/2020 09/13/2020 hydrocodone 10 mg-acetaminophen 325 mg tablet take 1 tablet by oral route  every 6 hours as needed for pain (DNF 10/08/20) DNF UNTIL 10/08/20  09/13/2020 hydrocodone 10 mg-acetaminophen 325 mg tablet take 1 tablet by oral route  every 6 hours as needed for pain (DNF 11/07/20)   03/23/2018 lorazepam 0.5 mg tablet     meloxicam 15 mg tablet take 1 tablet by oral route  every day    metformin 500 mg tablet    09/13/2020 methocarbamol 750 mg tablet take 1 tablet by oral route  every 8 hours (90 day supply)   01/28/2018 mirtazapine 45 mg tablet     Namenda 5 mg tablet take 2 tablet by oral route 2 times every day    simvastatin 40 mg tablet    12/26/2018 Symbicort 80 mcg-4.5 mcg/actuation HFA aerosol inhaler    11/11/2018 tamsulosin 0.4 mg capsule    08/02/2020 TIZANIDINE HYDROCHLORIDE 4 MG Tablet TAKE 1 TABLET FOUR TIMES DAILY  09/13/2020 12/15/2017 Ventolin HFA 90 mcg/actuation aerosol inhaler       ALLERGIES: Ingredient Reaction Medication Name Comment CODEINE Rash   OXYCODONE Aggressive behavior; Altered mental status    Reviewed, no changes.    PHYSICAL EXAM:  Vitals Date Temp F BP Pulse Ht In Wt Lb BMI BSA Pain Score 09/13/2020  161/81 75 72 187.2 25.39  9/10   PHYSICAL EXAM Details General Level of Distress: no acute distress Overall Appearance: normal    Cardiovascular Cardiac: regular rate and rhythm without murmur  Respiratory Lungs: clear to auscultation  Neurological Recent and Remote Memory: normal Attention Span and Concentration:   normal Language: normal Fund of Knowledge: normal  Right Left Sensation: normal normal Upper Extremity Coordination: normal normal  Lower Extremity Coordination: normal normal  Musculoskeletal Gait and Station: normal  Right Left Upper Extremity Muscle  Strength: normal normal Lower Extremity Muscle Strength: normal normal Upper Extremity Muscle Tone:  normal normal Lower Extremity Muscle Tone: normal normal   Motor Strength Upper and lower extremity motor strength was tested in the clinically pertinent muscles.     Deep Tendon Reflexes  Right Left Biceps: normal normal Triceps: normal normal Brachioradialis: normal normal Patellar: normal normal Achilles: normal normal  Sensory Sensation was tested at L1 to S1.   Cranial Nerves II. Optic Nerve/Visual Fields: normal III. Oculomotor: normal IV. Trochlear: normal V. Trigeminal: normal VI. Abducens: normal VII. Facial: normal VIII. Acoustic/Vestibular: normal IX. Glossopharyngeal: normal X. Vagus: normal XI. Spinal Accessory: normal XII. Hypoglossal: normal  Motor and other Tests Lhermittes: negative Rhomberg: negative    Right Left Hoffman's: normal normal Clonus: normal normal Babinski: normal normal SLR: negative negative Patrick's Corky Sox): negative negative Toe Walk: normal normal Toe Lift: normal normal Heel Walk: normal normal SI Joint: nontender nontender      IMPRESSION:  Patient with continued chronic pain. He does not find the spinal cord stimulator to be beneficial and the battery is causing him pain. He would like to have it removed.   PLAN: Proceed with removal of spinal cord stimulator with paddle lead at [facility].  Orders: Instruction(s)/Education: Assessment Instruction  Tobacco cessation counseling R03.0 Lifestyle education [phone] Dietary management education, guidance, and counseling  Completed Orders (this encounter) Order Details Reason Side Interpretation Result Initial Treatment Date Region Tobacco cessation counseling        Lifestyle education Patient will monitor and contact primary care physician if needed.       Dietary management education, guidance, and counseling Encouraged patient to eat  well balanced diet.        Assessment/Plan  # Detail Type Description  1. Assessment Lumbar degenerative disc disease (M51.36).     2. Assessment Low back pain, unspecified back pain laterality, unspecified chronicity, unspecified whether sciatica present (M54.50).     3. 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     4. Assessment Radiculopathy, lumbar region (M54.16).     5. 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     6. Assessment Elevated blood-pressure reading, w/o diagnosis of htn (R03.0).     7. Assessment Body mass index (BMI) 25.0-25.9, adult (Y18.56).  Plan Orders Today's instructions / counseling include(s) Dietary management education, guidance, and counseling. Clinical information/comments: Encouraged patient to eat well balanced diet.       Pain Management Plan Pain Scale: 9/10. Method: Numeric Pain Intensity Scale. Location: back, right hip. Onset: 11/06/2013. Duration: varies. Quality: discomforting. Pain management follow-up plan of care: Patient will continue medication management..              Provider:  Marchia Meiers. Vertell Limber MD  09/18/2020 09:12 AM    Dictation edited by: Fenton Malling, NP    CC Providers: Webb Silversmith [place],  [place]  [id number]-   Thomas Brackbill  [facility] [address] [place], [place] [address]-               Electronically signed by Fenton Malling NP on 09/18/2020 09:13 AM  on behalf of Marchia Meiers.  Vertell Limber MD

## 2020-09-23 DIAGNOSIS — J449 Chronic obstructive pulmonary disease, unspecified: Secondary | ICD-10-CM | POA: Diagnosis not present

## 2020-09-30 ENCOUNTER — Other Ambulatory Visit: Payer: Self-pay | Admitting: Internal Medicine

## 2020-09-30 DIAGNOSIS — F0391 Unspecified dementia with behavioral disturbance: Secondary | ICD-10-CM

## 2020-09-30 DIAGNOSIS — G4700 Insomnia, unspecified: Secondary | ICD-10-CM

## 2020-09-30 DIAGNOSIS — F03918 Unspecified dementia, unspecified severity, with other behavioral disturbance: Secondary | ICD-10-CM

## 2020-09-30 DIAGNOSIS — F32A Depression, unspecified: Secondary | ICD-10-CM

## 2020-10-05 ENCOUNTER — Other Ambulatory Visit: Payer: Self-pay | Admitting: Internal Medicine

## 2020-10-05 NOTE — Telephone Encounter (Signed)
Last filled 09/08/2020... please advise  

## 2020-10-16 ENCOUNTER — Other Ambulatory Visit (HOSPITAL_COMMUNITY)
Admission: RE | Admit: 2020-10-16 | Discharge: 2020-10-16 | Disposition: A | Discharge status: Home / Self Care | Payer: Medicare HMO | Source: Ambulatory Visit | Attending: Neurosurgery | Admitting: Neurosurgery

## 2020-10-16 DIAGNOSIS — Z20822 Contact with and (suspected) exposure to covid-19: Secondary | ICD-10-CM | POA: Insufficient documentation

## 2020-10-16 DIAGNOSIS — Z01812 Encounter for preprocedural laboratory examination: Secondary | ICD-10-CM | POA: Insufficient documentation

## 2020-10-16 LAB — SARS CORONAVIRUS 2 (TAT 6-24 HRS): SARS Coronavirus 2: NEGATIVE

## 2020-10-18 ENCOUNTER — Encounter (HOSPITAL_COMMUNITY): Payer: Self-pay | Admitting: Neurosurgery

## 2020-10-18 ENCOUNTER — Other Ambulatory Visit: Payer: Self-pay

## 2020-10-18 NOTE — Progress Notes (Addendum)
PCP - Webb Silversmith, NP Cardiologist -   Chest x-ray - 01/06/20 EKG - 12/22/19 Stress Test -  ECHO - 11/12/19 Cardiac Cath -   Fasting Blood Sugar:  unknown Checks Blood Sugar:  x/day  Blood Thinner Instructions:  Aspirin Instructions: Per pt daughter (main caretaker) last dose 10/09/20  ERAS Protcol -   COVID TEST- 10/16/20  Anesthesia review: n/a  -------------  SDW INSTRUCTIONS:  Your procedure is scheduled on 10/19/20. Please report to [facility] Main Entrance "A" at [place] and check in at the Admitting office. Call this number if you have problems the morning of surgery: [phone]   Remember: Do not eat or drink after midnight the night before your surgery  Medications to take morning of surgery with a sip of water include: budesonide-formoterol (SYMBICORT) carbidopa-levodopa (SINEMET IR)  citalopram (CELEXA)  divalproex (DEPAKOTE ER)  donepezil (ARICEPT)  fluticasone furoate-vilanterol (BREO ELLIPTA) HYDROcodone-acetaminophen (NORCO) memantine (NAMENDA XR)  methocarbamol (ROBAXIN) if needed nitroGLYCERIN (NITROSTAT) if needed  ** PLEASE check your blood sugar the morning of your surgery when you wake up and every 2 hours until you get to the Short Stay unit.  If your blood sugar is less than 70 mg/dL, you will need to treat for low blood sugar: - Do not take insulin. - Treat a low blood sugar (less than 70 mg/dL) with  cup of clear juice (cranberry or apple), 4 glucose tablets, OR glucose gel. - Recheck blood sugar in 15 minutes after treatment (to make sure it is greater than 70 mg/dL). If your blood sugar is not greater than 70 mg/dL on recheck, call [phone] for further instructions.   As of today, STOP taking any Aleve, Naproxen, Ibuprofen, Motrin, Advil, Goody's, BC's, all herbal medications, fish oil, and all vitamins.    The Morning of Surgery Do not wear jewelry Do not wear lotions, powders, or colognes, or deodorant Men may shave face and  neck. Do not bring valuables to the hospital. [facility] is not responsible for any belongings or valuables. If you are a smoker, DO NOT Smoke 24 hours prior to surgery If you wear a CPAP at night please bring your mask the morning of surgery  Remember that you must have someone to transport you home after your surgery, and remain with you for 24 hours if you are discharged the same day. Please bring cases for contacts, glasses, hearing aids, dentures or bridgework because it cannot be worn into surgery.   Patients discharged the day of surgery will not be allowed to drive home.   Please shower the NIGHT BEFORE SURGERY and the MORNING OF SURGERY with DIAL Soap. Wear comfortable clothes the morning of surgery. Oral Hygiene is also important to reduce your risk of infection.  Remember - BRUSH YOUR TEETH THE MORNING OF SURGERY WITH YOUR REGULAR TOOTHPASTE  Patient denies shortness of breath, fever, cough and chest pain.

## 2020-10-19 ENCOUNTER — Ambulatory Visit (HOSPITAL_COMMUNITY): Payer: Medicare HMO | Admitting: Certified Registered Nurse Anesthetist

## 2020-10-19 ENCOUNTER — Other Ambulatory Visit: Payer: Self-pay

## 2020-10-19 ENCOUNTER — Encounter (HOSPITAL_COMMUNITY)
Admission: RE | Disposition: A | Discharge status: Home / Self Care | Payer: Self-pay | Source: Home / Self Care | Attending: Neurosurgery

## 2020-10-19 ENCOUNTER — Ambulatory Visit (HOSPITAL_COMMUNITY): Payer: Medicare HMO

## 2020-10-19 ENCOUNTER — Encounter (HOSPITAL_COMMUNITY): Payer: Self-pay | Admitting: Neurosurgery

## 2020-10-19 ENCOUNTER — Observation Stay (HOSPITAL_COMMUNITY)
Admission: RE | Admit: 2020-10-19 | Discharge: 2020-10-19 | Disposition: A | Discharge status: Home / Self Care | Payer: Medicare HMO | Attending: Neurosurgery | Admitting: Neurosurgery

## 2020-10-19 DIAGNOSIS — M5136 Other intervertebral disc degeneration, lumbar region: Secondary | ICD-10-CM | POA: Diagnosis not present

## 2020-10-19 DIAGNOSIS — R030 Elevated blood-pressure reading, without diagnosis of hypertension: Secondary | ICD-10-CM | POA: Diagnosis not present

## 2020-10-19 DIAGNOSIS — F32A Depression, unspecified: Secondary | ICD-10-CM | POA: Diagnosis not present

## 2020-10-19 DIAGNOSIS — G8929 Other chronic pain: Secondary | ICD-10-CM | POA: Diagnosis not present

## 2020-10-19 DIAGNOSIS — Z79899 Other long term (current) drug therapy: Secondary | ICD-10-CM | POA: Insufficient documentation

## 2020-10-19 DIAGNOSIS — M5416 Radiculopathy, lumbar region: Secondary | ICD-10-CM | POA: Insufficient documentation

## 2020-10-19 DIAGNOSIS — M545 Low back pain, unspecified: Secondary | ICD-10-CM | POA: Diagnosis not present

## 2020-10-19 DIAGNOSIS — M4807 Spinal stenosis, lumbosacral region: Secondary | ICD-10-CM | POA: Insufficient documentation

## 2020-10-19 DIAGNOSIS — E7800 Pure hypercholesterolemia, unspecified: Secondary | ICD-10-CM | POA: Diagnosis not present

## 2020-10-19 DIAGNOSIS — I10 Essential (primary) hypertension: Secondary | ICD-10-CM | POA: Diagnosis not present

## 2020-10-19 DIAGNOSIS — Z419 Encounter for procedure for purposes other than remedying health state, unspecified: Secondary | ICD-10-CM

## 2020-10-19 DIAGNOSIS — Z7984 Long term (current) use of oral hypoglycemic drugs: Secondary | ICD-10-CM | POA: Diagnosis not present

## 2020-10-19 HISTORY — PX: SPINAL CORD STIMULATOR REMOVAL: SHX5379

## 2020-10-19 LAB — GLUCOSE, CAPILLARY
Glucose-Capillary: 182 mg/dL — ABNORMAL HIGH (ref 70–99)
Glucose-Capillary: 156 mg/dL — ABNORMAL HIGH (ref 70–99)
Glucose-Capillary: 135 mg/dL — ABNORMAL HIGH (ref 70–99)

## 2020-10-19 LAB — CBC
HCT: 38.8 % — ABNORMAL LOW (ref 39.0–52.0)
Hemoglobin: 13.2 g/dL (ref 13.0–17.0)
MCH: 34 pg (ref 26.0–34.0)
MCHC: 34 g/dL (ref 30.0–36.0)
MCV: 100 fL (ref 80.0–100.0)
Platelets: 181 10*3/uL (ref 150–400)
RBC: 3.88 MIL/uL — ABNORMAL LOW (ref 4.22–5.81)
RDW: 11.9 % (ref 11.5–15.5)
WBC: 5.7 10*3/uL (ref 4.0–10.5)
nRBC: 0 % (ref 0.0–0.2)

## 2020-10-19 LAB — BASIC METABOLIC PANEL
Anion gap: 10 (ref 5–15)
BUN: 16 mg/dL (ref 8–23)
CO2: 29 mmol/L (ref 22–32)
Calcium: 9.1 mg/dL (ref 8.9–10.3)
Chloride: 98 mmol/L (ref 98–111)
Creatinine, Ser: 1.23 mg/dL (ref 0.61–1.24)
GFR, Estimated: >60 mL/min (ref >60)
Glucose, Bld: 154 mg/dL — ABNORMAL HIGH (ref 70–99)
Potassium: 4 mmol/L (ref 3.5–5.1)
Sodium: 137 mmol/L (ref 135–145)

## 2020-10-19 LAB — SURGICAL PCR SCREEN
MRSA, PCR: NEGATIVE
Staphylococcus aureus: POSITIVE — AB

## 2020-10-19 LAB — HEMOGLOBIN A1C
Hgb A1c MFr Bld: 7.1 % — ABNORMAL HIGH (ref 4.8–5.6)
Mean Plasma Glucose: 157.07 mg/dL

## 2020-10-19 SURGERY — LUMBAR SPINAL CORD STIMULATOR REMOVAL
Anesthesia: General

## 2020-10-19 MED ORDER — MOMETASONE FURO-FORMOTEROL FUM 100-5 MCG/ACT IN AERO
2.0000 | INHALATION_SPRAY | Freq: Two times a day (BID) | RESPIRATORY_TRACT | Status: DC
  Filled 2020-10-19: qty 8.8

## 2020-10-19 MED ORDER — FENTANYL CITRATE (PF) 250 MCG/5ML IJ SOLN
INTRAMUSCULAR | Status: AC
  Filled 2020-10-19: qty 5

## 2020-10-19 MED ORDER — PANTOPRAZOLE SODIUM 40 MG IV SOLR: 40.0000 mg | Freq: Every day | INTRAVENOUS | Status: DC

## 2020-10-19 MED ORDER — LIDOCAINE-EPINEPHRINE 1 %-1:100000 IJ SOLN
INTRAMUSCULAR | Status: DC | PRN
  Administered 2020-10-19: 8 mL

## 2020-10-19 MED ORDER — NITROGLYCERIN 0.4 MG SL SUBL: 0.4000 mg | SUBLINGUAL_TABLET | SUBLINGUAL | Status: DC | PRN

## 2020-10-19 MED ORDER — MEMANTINE HCL ER 7 MG PO CP24: 7.0000 mg | ORAL_CAPSULE | Freq: Every morning | ORAL | Status: DC

## 2020-10-19 MED ORDER — PROPOFOL 10 MG/ML IV BOLUS
INTRAVENOUS | Status: AC
  Filled 2020-10-19: qty 20

## 2020-10-19 MED ORDER — BUPIVACAINE HCL (PF) 0.5 % IJ SOLN
INTRAMUSCULAR | Status: AC
  Filled 2020-10-19: qty 30

## 2020-10-19 MED ORDER — CEFAZOLIN SODIUM-DEXTROSE 2-4 GM/100ML-% IV SOLN
INTRAVENOUS | Status: AC
  Filled 2020-10-19: qty 100

## 2020-10-19 MED ORDER — MUPIROCIN 2 % EX OINT: 1.0000 "application " | TOPICAL_OINTMENT | Freq: Two times a day (BID) | CUTANEOUS | Status: DC | PRN

## 2020-10-19 MED ORDER — SODIUM CHLORIDE 0.9 % IV SOLN: 250.0000 mL | INTRAVENOUS | Status: DC

## 2020-10-19 MED ORDER — POLYETHYLENE GLYCOL 3350 17 G PO PACK
17.0000 g | PACK | Freq: Every day | ORAL | Status: DC | PRN
Start: 2020-10-19 — End: 2020-10-19

## 2020-10-19 MED ORDER — KCL IN DEXTROSE-NACL 20-5-0.45 MEQ/L-%-% IV SOLN: INTRAVENOUS | Status: DC

## 2020-10-19 MED ORDER — BISACODYL 10 MG RE SUPP
10.0000 mg | Freq: Every day | RECTAL | Status: DC | PRN
Start: 2020-10-19 — End: 2020-10-19

## 2020-10-19 MED ORDER — METHOCARBAMOL 750 MG PO TABS
750.0000 mg | ORAL_TABLET | Freq: Three times a day (TID) | ORAL | Status: DC
  Administered 2020-10-19: 750 mg via ORAL
  Filled 2020-10-19: qty 1

## 2020-10-19 MED ORDER — OXYCODONE HCL 5 MG PO TABS
5.0000 mg | ORAL_TABLET | Freq: Once | ORAL | Status: DC | PRN
Start: 2020-10-19 — End: 2020-10-19

## 2020-10-19 MED ORDER — ROCURONIUM BROMIDE 10 MG/ML (PF) SYRINGE
PREFILLED_SYRINGE | INTRAVENOUS | Status: DC | PRN
  Administered 2020-10-19: 50 mg via INTRAVENOUS

## 2020-10-19 MED ORDER — KETOROLAC TROMETHAMINE 15 MG/ML IJ SOLN: 7.5000 mg | Freq: Four times a day (QID) | INTRAMUSCULAR | Status: DC

## 2020-10-19 MED ORDER — LORAZEPAM 0.5 MG PO TABS: 0.5000 mg | ORAL_TABLET | Freq: Every day | ORAL | Status: DC

## 2020-10-19 MED ORDER — SIMVASTATIN 20 MG PO TABS: 40.0000 mg | ORAL_TABLET | ORAL | Status: DC

## 2020-10-19 MED ORDER — DONEPEZIL HCL 10 MG PO TABS
10.0000 mg | ORAL_TABLET | Freq: Every day | ORAL | Status: DC
  Filled 2020-10-19: qty 1

## 2020-10-19 MED ORDER — LIDOCAINE 2% (20 MG/ML) 5 ML SYRINGE
INTRAMUSCULAR | Status: DC | PRN
  Administered 2020-10-19: 60 mg via INTRAVENOUS

## 2020-10-19 MED ORDER — FENTANYL CITRATE (PF) 250 MCG/5ML IJ SOLN
INTRAMUSCULAR | Status: DC | PRN
  Administered 2020-10-19: 50 ug via INTRAVENOUS

## 2020-10-19 MED ORDER — ONDANSETRON HCL 4 MG PO TABS: 4.0000 mg | ORAL_TABLET | Freq: Four times a day (QID) | ORAL | Status: DC | PRN

## 2020-10-19 MED ORDER — LIDOCAINE-EPINEPHRINE 1 %-1:100000 IJ SOLN
INTRAMUSCULAR | Status: AC
  Filled 2020-10-19: qty 1

## 2020-10-19 MED ORDER — CHLORHEXIDINE GLUCONATE 0.12 % MT SOLN: 15.0000 mL | Freq: Once | OROMUCOSAL | Status: AC

## 2020-10-19 MED ORDER — VITAMIN D 25 MCG (1000 UNIT) PO TABS: 2000.0000 [IU] | ORAL_TABLET | Freq: Every day | ORAL | Status: DC

## 2020-10-19 MED ORDER — CHLORHEXIDINE GLUCONATE 0.12 % MT SOLN
OROMUCOSAL | Status: AC
  Administered 2020-10-19: 15 mL via OROMUCOSAL
  Filled 2020-10-19: qty 15

## 2020-10-19 MED ORDER — ONDANSETRON HCL 4 MG/2ML IJ SOLN
INTRAMUSCULAR | Status: DC | PRN
  Administered 2020-10-19: 4 mg via INTRAVENOUS

## 2020-10-19 MED ORDER — PROPOFOL 10 MG/ML IV BOLUS
INTRAVENOUS | Status: DC | PRN
  Administered 2020-10-19: 100 mg via INTRAVENOUS

## 2020-10-19 MED ORDER — ALBUTEROL SULFATE (2.5 MG/3ML) 0.083% IN NEBU: 2.5000 mg | INHALATION_SOLUTION | Freq: Four times a day (QID) | RESPIRATORY_TRACT | Status: DC | PRN

## 2020-10-19 MED ORDER — HYDROCODONE-ACETAMINOPHEN 5-325 MG PO TABS: 2.0000 | ORAL_TABLET | ORAL | Status: DC | PRN

## 2020-10-19 MED ORDER — CITALOPRAM HYDROBROMIDE 20 MG PO TABS: 20.0000 mg | ORAL_TABLET | Freq: Every day | ORAL | Status: DC

## 2020-10-19 MED ORDER — CARBIDOPA-LEVODOPA 25-100 MG PO TABS
1.0000 | ORAL_TABLET | Freq: Two times a day (BID) | ORAL | Status: DC
  Administered 2020-10-19: 1 via ORAL
  Filled 2020-10-19 (×2): qty 1

## 2020-10-19 MED ORDER — DOCUSATE SODIUM 100 MG PO CAPS: 100.0000 mg | ORAL_CAPSULE | Freq: Two times a day (BID) | ORAL | Status: DC

## 2020-10-19 MED ORDER — DEXAMETHASONE SODIUM PHOSPHATE 10 MG/ML IJ SOLN
INTRAMUSCULAR | Status: DC | PRN
  Administered 2020-10-19: 4 mg via INTRAVENOUS

## 2020-10-19 MED ORDER — GABAPENTIN 400 MG PO CAPS: 800.0000 mg | ORAL_CAPSULE | Freq: Every day | ORAL | Status: DC

## 2020-10-19 MED ORDER — CHLORHEXIDINE GLUCONATE CLOTH 2 % EX PADS: 6.0000 | MEDICATED_PAD | Freq: Once | CUTANEOUS | Status: DC

## 2020-10-19 MED ORDER — ACETAMINOPHEN 650 MG RE SUPP: 650.0000 mg | RECTAL | Status: DC | PRN

## 2020-10-19 MED ORDER — HYDROCODONE-ACETAMINOPHEN 10-325 MG PO TABS
1.0000 | ORAL_TABLET | ORAL | Status: DC | PRN
Start: 2020-10-19 — End: 2020-10-19
  Administered 2020-10-19: 1 via ORAL
  Filled 2020-10-19: qty 1

## 2020-10-19 MED ORDER — ONDANSETRON HCL 4 MG/2ML IJ SOLN: 4.0000 mg | Freq: Once | INTRAMUSCULAR | Status: DC | PRN

## 2020-10-19 MED ORDER — OXYCODONE HCL 5 MG PO TABS
5.0000 mg | ORAL_TABLET | ORAL | Status: DC | PRN
Start: 2020-10-19 — End: 2020-10-19

## 2020-10-19 MED ORDER — BUPIVACAINE HCL (PF) 0.5 % IJ SOLN
INTRAMUSCULAR | Status: DC | PRN
  Administered 2020-10-19: 8 mL

## 2020-10-19 MED ORDER — APREPITANT 40 MG PO CAPS
40.0000 mg | ORAL_CAPSULE | Freq: Once | ORAL | Status: AC
  Administered 2020-10-19: 40 mg via ORAL
  Filled 2020-10-19: qty 1

## 2020-10-19 MED ORDER — INSULIN ASPART 100 UNIT/ML ~~LOC~~ SOLN: 0.0000 [IU] | Freq: Three times a day (TID) | SUBCUTANEOUS | Status: DC

## 2020-10-19 MED ORDER — ASPIRIN EC 81 MG PO TBEC: 81.0000 mg | DELAYED_RELEASE_TABLET | Freq: Every day | ORAL | Status: DC

## 2020-10-19 MED ORDER — THROMBIN 5000 UNITS EX SOLR
OROMUCOSAL | Status: DC | PRN
  Administered 2020-10-19: 5 mL via TOPICAL

## 2020-10-19 MED ORDER — THROMBIN 5000 UNITS EX SOLR
CUTANEOUS | Status: AC
  Filled 2020-10-19: qty 5000

## 2020-10-19 MED ORDER — PHENYLEPHRINE HCL-NACL 10-0.9 MG/250ML-% IV SOLN
INTRAVENOUS | Status: DC | PRN
  Administered 2020-10-19: 20 ug/min via INTRAVENOUS

## 2020-10-19 MED ORDER — HYDROMORPHONE HCL 1 MG/ML IJ SOLN: 0.5000 mg | INTRAMUSCULAR | Status: DC | PRN

## 2020-10-19 MED ORDER — OXYCODONE HCL 5 MG/5ML PO SOLN
5.0000 mg | Freq: Once | ORAL | Status: DC | PRN
Start: 2020-10-19 — End: 2020-10-19

## 2020-10-19 MED ORDER — FENTANYL CITRATE (PF) 100 MCG/2ML IJ SOLN: 25.0000 ug | INTRAMUSCULAR | Status: DC | PRN

## 2020-10-19 MED ORDER — CEFAZOLIN SODIUM-DEXTROSE 2-4 GM/100ML-% IV SOLN
2.0000 g | INTRAVENOUS | Status: AC
  Administered 2020-10-19: 2 g via INTRAVENOUS

## 2020-10-19 MED ORDER — ONDANSETRON HCL 4 MG/2ML IJ SOLN: 4.0000 mg | Freq: Four times a day (QID) | INTRAMUSCULAR | Status: DC | PRN

## 2020-10-19 MED ORDER — METHOCARBAMOL 500 MG PO TABS: 500.0000 mg | ORAL_TABLET | Freq: Four times a day (QID) | ORAL | Status: DC | PRN

## 2020-10-19 MED ORDER — SODIUM CHLORIDE 0.9% FLUSH: 3.0000 mL | Freq: Two times a day (BID) | INTRAVENOUS | Status: DC

## 2020-10-19 MED ORDER — TAMSULOSIN HCL 0.4 MG PO CAPS: 0.4000 mg | ORAL_CAPSULE | Freq: Every day | ORAL | Status: DC

## 2020-10-19 MED ORDER — ASCORBIC ACID 500 MG PO TABS: 500.0000 mg | ORAL_TABLET | Freq: Every day | ORAL | Status: DC

## 2020-10-19 MED ORDER — SODIUM CHLORIDE 0.9% FLUSH: 3.0000 mL | INTRAVENOUS | Status: DC | PRN

## 2020-10-19 MED ORDER — PHENOL 1.4 % MT LIQD: 1.0000 | OROMUCOSAL | Status: DC | PRN

## 2020-10-19 MED ORDER — FUROSEMIDE 20 MG PO TABS
20.0000 mg | ORAL_TABLET | Freq: Two times a day (BID) | ORAL | Status: DC
Start: 2020-10-20 — End: 2020-10-19

## 2020-10-19 MED ORDER — ORAL CARE MOUTH RINSE: 15.0000 mL | Freq: Once | OROMUCOSAL | Status: AC

## 2020-10-19 MED ORDER — METHOCARBAMOL 1000 MG/10ML IJ SOLN
500.0000 mg | Freq: Four times a day (QID) | INTRAVENOUS | Status: DC | PRN
  Filled 2020-10-19: qty 5

## 2020-10-19 MED ORDER — LACTATED RINGERS IV SOLN: INTRAVENOUS | Status: DC

## 2020-10-19 MED ORDER — DIVALPROEX SODIUM ER 500 MG PO TB24
500.0000 mg | ORAL_TABLET | Freq: Two times a day (BID) | ORAL | Status: DC
  Filled 2020-10-19 (×2): qty 1

## 2020-10-19 MED ORDER — ALUM & MAG HYDROXIDE-SIMETH 200-200-20 MG/5ML PO SUSP: 30.0000 mL | Freq: Four times a day (QID) | ORAL | Status: DC | PRN

## 2020-10-19 MED ORDER — MELOXICAM 7.5 MG PO TABS: 15.0000 mg | ORAL_TABLET | Freq: Every evening | ORAL | Status: DC

## 2020-10-19 MED ORDER — MIRTAZAPINE 30 MG PO TABS
45.0000 mg | ORAL_TABLET | Freq: Every day | ORAL | Status: DC
  Filled 2020-10-19: qty 1

## 2020-10-19 MED ORDER — FLEET ENEMA 7-19 GM/118ML RE ENEM: 1.0000 | ENEMA | Freq: Once | RECTAL | Status: DC | PRN

## 2020-10-19 MED ORDER — ALBUTEROL SULFATE 108 (90 BASE) MCG/ACT IN AEPB: 1.0000 | INHALATION_SPRAY | RESPIRATORY_TRACT | Status: DC | PRN

## 2020-10-19 MED ORDER — CELECOXIB 200 MG PO CAPS: 200.0000 mg | ORAL_CAPSULE | Freq: Two times a day (BID) | ORAL | Status: DC

## 2020-10-19 MED ORDER — ZOLPIDEM TARTRATE 5 MG PO TABS: 5.0000 mg | ORAL_TABLET | Freq: Every evening | ORAL | Status: DC | PRN

## 2020-10-19 MED ORDER — ACETAMINOPHEN 325 MG PO TABS: 650.0000 mg | ORAL_TABLET | ORAL | Status: DC | PRN

## 2020-10-19 MED ORDER — MENTHOL 3 MG MT LOZG: 1.0000 | LOZENGE | OROMUCOSAL | Status: DC | PRN

## 2020-10-19 MED ORDER — CEFAZOLIN SODIUM-DEXTROSE 2-4 GM/100ML-% IV SOLN: 2.0000 g | Freq: Three times a day (TID) | INTRAVENOUS | Status: DC

## 2020-10-19 MED ORDER — SUGAMMADEX SODIUM 200 MG/2ML IV SOLN
INTRAVENOUS | Status: DC | PRN
  Administered 2020-10-19: 200 mg via INTRAVENOUS

## 2020-10-19 MED ORDER — FLUTICASONE FUROATE-VILANTEROL 100-25 MCG/INH IN AEPB: 1.0000 | INHALATION_SPRAY | Freq: Every day | RESPIRATORY_TRACT | Status: DC

## 2020-10-19 MED ORDER — PHENYLEPHRINE 40 MCG/ML (10ML) SYRINGE FOR IV PUSH (FOR BLOOD PRESSURE SUPPORT)
PREFILLED_SYRINGE | INTRAVENOUS | Status: DC | PRN
  Administered 2020-10-19: 80 ug via INTRAVENOUS

## 2020-10-19 MED ORDER — INSULIN ASPART 100 UNIT/ML ~~LOC~~ SOLN: 0.0000 [IU] | Freq: Every day | SUBCUTANEOUS | Status: DC

## 2020-10-19 MED ORDER — METFORMIN HCL 500 MG PO TABS: 500.0000 mg | ORAL_TABLET | Freq: Two times a day (BID) | ORAL | Status: DC

## 2020-10-19 MED ORDER — 0.9 % SODIUM CHLORIDE (POUR BTL) OPTIME
TOPICAL | Status: DC | PRN
  Administered 2020-10-19: 1000 mL

## 2020-10-19 SURGICAL SUPPLY — 51 items
ADH SKN CLS APL DERMABOND .7 (GAUZE/BANDAGES/DRESSINGS) ×2
BLADE CLIPPER SURG (BLADE) IMPLANT
BUR MATCHSTICK NEURO 3.0 LAGG (BURR) IMPLANT
BUR PRECISION FLUTE 5.0 (BURR) ×1 IMPLANT
CARTRIDGE OIL MAESTRO DRILL (MISCELLANEOUS) ×1 IMPLANT
COVER WAND RF STERILE (DRAPES) ×2 IMPLANT
DECANTER SPIKE VIAL GLASS SM (MISCELLANEOUS) ×2 IMPLANT
DERMABOND ADVANCED (GAUZE/BANDAGES/DRESSINGS) ×2
DERMABOND ADVANCED .7 DNX12 (GAUZE/BANDAGES/DRESSINGS) IMPLANT
DIFFUSER DRILL AIR PNEUMATIC (MISCELLANEOUS) ×2 IMPLANT
DRAPE C-ARM 42X72 X-RAY (DRAPES) ×1 IMPLANT
DRAPE INCISE IOBAN 85X60 (DRAPES) IMPLANT
DRAPE LAPAROTOMY 100X72X124 (DRAPES) ×2 IMPLANT
DRAPE SURG 17X23 STRL (DRAPES) ×2 IMPLANT
DRSG OPSITE POSTOP 3X4 (GAUZE/BANDAGES/DRESSINGS) ×2 IMPLANT
ELECT REM PT RETURN 9FT ADLT (ELECTROSURGICAL) ×2
ELECTRODE REM PT RTRN 9FT ADLT (ELECTROSURGICAL) ×1 IMPLANT
GAUZE 4X4 16PLY RFD (DISPOSABLE) IMPLANT
GLOVE BIO SURGEON STRL SZ8 (GLOVE) ×2 IMPLANT
GLOVE BIOGEL PI IND STRL 8.5 (GLOVE) ×1 IMPLANT
GLOVE BIOGEL PI INDICATOR 8.5 (GLOVE) ×1
GLOVE ECLIPSE 8.0 STRL XLNG CF (GLOVE) ×2 IMPLANT
GLOVE EXAM NITRILE XL STR (GLOVE) IMPLANT
GLOVE SRG 8 PF TXTR STRL LF DI (GLOVE) ×1 IMPLANT
GLOVE SURG UNDER POLY LF SZ8 (GLOVE) ×2
GOWN STRL REUS W/ TWL LRG LVL3 (GOWN DISPOSABLE) IMPLANT
GOWN STRL REUS W/ TWL XL LVL3 (GOWN DISPOSABLE) IMPLANT
GOWN STRL REUS W/TWL 2XL LVL3 (GOWN DISPOSABLE) ×1 IMPLANT
GOWN STRL REUS W/TWL LRG LVL3 (GOWN DISPOSABLE) ×4
GOWN STRL REUS W/TWL XL LVL3 (GOWN DISPOSABLE) ×2
KIT BASIN OR (CUSTOM PROCEDURE TRAY) ×2 IMPLANT
KIT TURNOVER KIT B (KITS) ×2 IMPLANT
NDL HYPO 25X1 1.5 SAFETY (NEEDLE) ×1 IMPLANT
NEEDLE HYPO 25X1 1.5 SAFETY (NEEDLE) ×2 IMPLANT
NS IRRIG 1000ML POUR BTL (IV SOLUTION) ×2 IMPLANT
OIL CARTRIDGE MAESTRO DRILL (MISCELLANEOUS) ×2
PACK LAMINECTOMY NEURO (CUSTOM PROCEDURE TRAY) ×2 IMPLANT
PAD ARMBOARD 7.5X6 YLW CONV (MISCELLANEOUS) ×2 IMPLANT
SPONGE LAP 4X18 RFD (DISPOSABLE) IMPLANT
SPONGE SURGIFOAM ABS GEL SZ50 (HEMOSTASIS) ×2 IMPLANT
STAPLER SKIN PROX WIDE 3.9 (STAPLE) ×2 IMPLANT
SUT SILK 0 TIES 10X30 (SUTURE) IMPLANT
SUT SILK 2 0 PERMA HAND 18 BK (SUTURE) ×6 IMPLANT
SUT SILK 2 0 TIES 10X30 (SUTURE) ×2 IMPLANT
SUT VIC AB 0 CT1 18XCR BRD8 (SUTURE) ×1 IMPLANT
SUT VIC AB 0 CT1 8-18 (SUTURE) ×2
SUT VIC AB 2-0 CP2 18 (SUTURE) ×3 IMPLANT
SUT VIC AB 3-0 SH 8-18 (SUTURE) ×3 IMPLANT
TOWEL GREEN STERILE (TOWEL DISPOSABLE) ×2 IMPLANT
TOWEL GREEN STERILE FF (TOWEL DISPOSABLE) ×2 IMPLANT
WATER STERILE IRR 1000ML POUR (IV SOLUTION) ×2 IMPLANT

## 2020-10-19 NOTE — Anesthesia Postprocedure Evaluation (Signed)
Anesthesia Post Note  Patient: Alexander Booth [facility]  Procedure(s) Performed: Removal of spinal cord stimulator with paddle lead (N/A )     Patient location during evaluation: PACU Anesthesia Type: General Level of consciousness: awake and alert Pain management: pain level controlled Vital Signs Assessment: post-procedure vital signs reviewed and stable Respiratory status: spontaneous breathing, nonlabored ventilation and respiratory function stable Cardiovascular status: blood pressure returned to baseline and stable Postop Assessment: no apparent nausea or vomiting Anesthetic complications: no   No complications documented.  Last Vitals:  Vitals:   10/19/20 1318 10/19/20 1347  BP: 133/61 (!) 156/69  Pulse: 64 75  Resp: (!) 9 20  Temp: 36.5 C (!) 36.3 C  SpO2: 91% 95%    Last Pain:  Vitals:   10/19/20 1347  TempSrc: Oral  PainSc:                  Audry Pili

## 2020-10-19 NOTE — Progress Notes (Signed)
OT Cancellation Note  Patient Details Name: Alexander Booth MRN: [id number] DOB: [birth year]   Cancelled Treatment:    Reason Eval/Treat Not Completed: OT screened, no needs identified, will sign off. Per PT, pt performing at Mod I level and demonstrating good adherence to precautions. Reports that precautions are more for comfort and is eager to dc to home. Will screen for OT. Thank you.  [employer], OTR/L Acute Rehab Pager: [phone] Office: [phone] 10/19/2020, 2:44 PM

## 2020-10-19 NOTE — Brief Op Note (Signed)
10/19/2020  12:34 PM  PATIENT:  Alexander Booth  75 y.o. male  PRE-OPERATIVE DIAGNOSIS:  Lumbar degenerative disc disease, chronic pain  POST-OPERATIVE DIAGNOSIS:  Lumbar degenerative disc disease, chronic pain   PROCEDURE:  Procedure(s): Removal of spinal cord stimulator with paddle lead (N/A)  SURGEON:  Surgeon(s) and Role:    Erline Levine, MD - Primary    * Dawley, Theodoro Doing, DO - Assisting  PHYSICIAN ASSISTANT:   ASSISTANTS: Poteat, RN   ANESTHESIA:   general  EBL:  10 mL   BLOOD ADMINISTERED:none  DRAINS: none   LOCAL MEDICATIONS USED:  MARCAINE    and LIDOCAINE   SPECIMEN:  No Specimen  DISPOSITION OF SPECIMEN:  N/A  COUNTS:  YES  TOURNIQUET:  * No tourniquets in log *  DICTATION: Patient has back pain from disc degeneration.  He has a spinal cord stimulator, which is no longer benefiting him and he wishes for it to be removed.  Procedure:  Patient was brought to OR and after satisfactory induction of GETA, he was placed prone on flat rolls.  Mid-low back were shaved and prepped with betadine scrub and Duraprep.  Area of planned incision was infiltrated with lidocaine.  Old incision was reopened and IPG and paddle lead was exposed.  IPG was removed and lead extensions were cut.  Paddle lead was exposed and removed.    Wound was irrigated and hemostasis assured.  Wound was closed with 0, 2-0, 3-0 Vicryl sutures.   A sterile occlusive dressing was placed.  Patient was extubated and taken to recovery in stable and satisfactory condition, having tolerated his surgery well.   PLAN OF CARE: Admit for overnight observation  PATIENT DISPOSITION:  PACU - hemodynamically stable.   Delay start of Pharmacological VTE agent (>24hrs) due to surgical blood loss or risk of bleeding: yes

## 2020-10-19 NOTE — Interval H&P Note (Signed)
History and Physical Interval Note:  10/19/2020 10:42 AM  Alexander Booth  has presented today for surgery, with the diagnosis of Lumbar degenerative disc disease.  The various methods of treatment have been discussed with the patient and family. After consideration of risks, benefits and other options for treatment, the patient has consented to  Procedure(s): Removal of spinal cord stimulator with paddle lead (N/A) as a surgical intervention.  The patient's history has been reviewed, patient examined, no change in status, stable for surgery.  I have reviewed the patient's chart and labs.  Questions were answered to the patient's satisfaction.     Peggyann Shoals

## 2020-10-19 NOTE — Discharge Instructions (Signed)

## 2020-10-19 NOTE — Op Note (Signed)
10/19/2020  12:34 PM  PATIENT:  Alexander Booth  75 y.o. male  PRE-OPERATIVE DIAGNOSIS:  Lumbar degenerative disc disease, chronic pain  POST-OPERATIVE DIAGNOSIS:  Lumbar degenerative disc disease, chronic pain   PROCEDURE:  Procedure(s): Removal of spinal cord stimulator with paddle lead (N/A)  SURGEON:  Surgeon(s) and Role:    Erline Levine, MD - Primary    * Dawley, Theodoro Doing, DO - Assisting  PHYSICIAN ASSISTANT:   ASSISTANTS: Poteat, RN   ANESTHESIA:   general  EBL:  10 mL   BLOOD ADMINISTERED:none  DRAINS: none   LOCAL MEDICATIONS USED:  MARCAINE    and LIDOCAINE   SPECIMEN:  No Specimen  DISPOSITION OF SPECIMEN:  N/A  COUNTS:  YES  TOURNIQUET:  * No tourniquets in log *  DICTATION: Patient has back pain from disc degeneration.  He has a spinal cord stimulator, which is no longer benefiting him and he wishes for it to be removed.  Procedure:  Patient was brought to OR and after satisfactory induction of GETA, he was placed prone on flat rolls.  Mid-low back were shaved and prepped with betadine scrub and Duraprep.  Area of planned incision was infiltrated with lidocaine.  Old incision was reopened and IPG and paddle lead was exposed.  IPG was removed and lead extensions were cut.  Paddle lead was exposed and removed.    Wound was irrigated and hemostasis assured.  Wound was closed with 0, 2-0, 3-0 Vicryl sutures.   A sterile occlusive dressing was placed.  Patient was extubated and taken to recovery in stable and satisfactory condition, having tolerated his surgery well.   PLAN OF CARE: Admit for overnight observation  PATIENT DISPOSITION:  PACU - hemodynamically stable.   Delay start of Pharmacological VTE agent (>24hrs) due to surgical blood loss or risk of bleeding: yes

## 2020-10-19 NOTE — Progress Notes (Signed)
Patient is discharged from room 3C08 at this time. Alert and in stable condition. IV site d/c'd and instructions read to patient and daughter with understanding verbalized and all questions answered. Left unit via wheelchair with all belongings at side.

## 2020-10-19 NOTE — Anesthesia Procedure Notes (Signed)
Procedure Name: Intubation Date/Time: 10/19/2020 11:20 AM Performed by: Dorthea Cove, CRNA Pre-anesthesia Checklist: Patient identified, Emergency Drugs available, Suction available and Patient being monitored Patient Re-evaluated:Patient Re-evaluated prior to induction Oxygen Delivery Method: Circle system utilized Preoxygenation: Pre-oxygenation with 100% oxygen Induction Type: IV induction Ventilation: Mask ventilation without difficulty and Oral airway inserted - appropriate to patient size Laryngoscope Size: Sabra Heck and 2 Grade View: Grade II Tube type: Oral Tube size: 7.5 mm Number of attempts: 1 Airway Equipment and Method: Stylet and Oral airway Placement Confirmation: ETT inserted through vocal cords under direct vision,  positive ETCO2 and breath sounds checked- equal and bilateral Secured at: 21 cm Tube secured with: Tape Dental Injury: Teeth and Oropharynx as per pre-operative assessment

## 2020-10-19 NOTE — Transfer of Care (Signed)
Immediate Anesthesia Transfer of Care Note  Patient: Sarim Rothman [facility]  Procedure(s) Performed: Removal of spinal cord stimulator with paddle lead (N/A )  Patient Location: PACU  Anesthesia Type:General  Level of Consciousness: awake, alert  and oriented  Airway & Oxygen Therapy: Patient Spontanous Breathing and Patient connected to face mask oxygen  Post-op Assessment: Report given to RN and Post -op Vital signs reviewed and stable  Post vital signs: Reviewed and stable  Last Vitals:  Vitals Value Taken Time  BP 136/65 10/19/20 1233  Temp    Pulse 71 10/19/20 1233  Resp 18 10/19/20 1233  SpO2 100 % 10/19/20 1233  Vitals shown include unvalidated device data.  Last Pain:  Vitals:   10/19/20 0934  TempSrc:   PainSc: 0-No pain      Patients Stated Pain Goal: 4 (07/12/40 1464)  Complications: No complications documented.

## 2020-10-19 NOTE — Discharge Summary (Addendum)
Physician Discharge Summary  Patient ID: Alexander Booth MRN: [id number] DOB/AGE: 1945/10/06 75 y.o.  Admit date: 10/19/2020 Discharge date: 10/19/2020  Admission Diagnoses: Lumbar degenerative disc disease, chronic pain  Discharge Diagnoses: Lumbar degenerative disc disease, chronic pain Active Problems:   Chronic pain   Discharged Condition: good  Hospital Course: The patient was admitted on 10/19/2020 and taken to the operating room where the patient underwent removal of spinal cord stimulator with paddle lead. The patient tolerated the procedure well and was taken to the recovery room in stable condition. The hospital course was routine. There were no complications. The wounds remained clean dry and intact. Pt had appropriate soreness. No complaints of leg pain or new N/T/W. The patient remained afebrile with stable vital signs, and tolerated a regular diet. The patient continued to increase activities, and pain was well controlled with oral pain medications.   Consults: None  Significant Diagnostic Studies: N/A  Treatments: surgery: Removal of spinal cord stimulator with paddle lead   Discharge Exam: Blood pressure (!) 156/69, pulse 75, temperature (!) 97.4 F (36.3 C), temperature source Oral, resp. rate 20, height 6' (1.829 m), weight 86.2 kg, SpO2 95 %.   Physical Exam: Patient is awake, A/O X 4, conversant, and in good spirits. Speech is fluent and appropriate. Doing well. MAEW with good strength that is symmetric bilaterally. 5/5 BUE/BLE. Dressing is clean dry intact. Incision is well approximated with no drainage, erythema, or edema.     Disposition: Discharge disposition: 01-Home or Self Care        Allergies as of 10/19/2020      Reactions   Codeine Hives, Itching      Medication List    TAKE these medications   Accu-Chek Aviva Plus test strip Generic drug: glucose blood Check sugar 2 times daily. DX E11.8   Accu-Chek Aviva Plus w/Device Kit Check  sugar 2 times daily. DX E11.8   Accu-Chek Softclix Lancets lancets Check sugar 2 times daily. DX E11.8   aspirin EC 81 MG tablet Take 81 mg by mouth daily with breakfast.   Breo Ellipta 100-25 MCG/INH Aepb Generic drug: fluticasone furoate-vilanterol Inhale 1 puff into the lungs daily.   budesonide-formoterol 80-4.5 MCG/ACT inhaler Commonly known as: SYMBICORT Inhale 2 puffs into the lungs daily as needed (COPD).   carbidopa-levodopa 25-100 MG tablet Commonly known as: SINEMET IR TAKE 1 TABLET BY MOUTH THREE TIMES A DAY What changed:   when to take this  additional instructions   citalopram 20 MG tablet Commonly known as: CELEXA TAKE 1 TABLET EVERY DAY What changed: when to take this   divalproex 500 MG 24 hr tablet Commonly known as: DEPAKOTE ER TAKE 2 TABLETS EVERY DAY What changed:   how much to take  when to take this  additional instructions   donepezil 10 MG tablet Commonly known as: ARICEPT TAKE 1 TABLET EVERY MORNING What changed: when to take this   furosemide 20 MG tablet Commonly known as: LASIX Take 1 tablet (20 mg total) by mouth 2 (two) times daily with breakfast and lunch.   gabapentin 400 MG capsule Commonly known as: NEURONTIN TAKE 3 CAPSULES AT BEDTIME AND MAY REPEAT DOSE ONE TIME IF NEEDED What changed: See the new instructions.   HYDROcodone-acetaminophen 10-325 MG tablet Commonly known as: NORCO Take 1 tablet by mouth in the morning, at noon, and at bedtime.   LORazepam 0.5 MG tablet Commonly known as: ATIVAN Take 1 tablet (0.5 mg total) by mouth at bedtime.  meloxicam 15 MG tablet Commonly known as: MOBIC TAKE 1 TABLET BY MOUTH EVERY DAY What changed: when to take this   memantine 7 MG Cp24 24 hr capsule Commonly known as: NAMENDA XR TAKE 1 CAPSULE (7 MG TOTAL) BY MOUTH EVERY MORNING.   metFORMIN 500 MG tablet Commonly known as: GLUCOPHAGE Take 1 tablet (500 mg total) by mouth See admin instructions. Take one tablet (500  mg) by mouth twice daily - with lunch and with supper What changed:   when to take this  additional instructions   methocarbamol 750 MG tablet Commonly known as: ROBAXIN Take 1 tablet (750 mg total) by mouth in the morning and at bedtime. What changed: when to take this   mirtazapine 45 MG tablet Commonly known as: REMERON Take 1 tablet (45 mg total) by mouth at bedtime.   mupirocin ointment 2 % Commonly known as: Bactroban Apply 1 application topically 2 (two) times daily. What changed:   when to take this  reasons to take this   nitroGLYCERIN 0.4 MG SL tablet Commonly known as: NITROSTAT Place 0.4 mg under the tongue every 5 (five) minutes x 3 doses as needed for chest pain.   ProAir RespiClick 257 (90 Base) MCG/ACT Aepb Generic drug: Albuterol Sulfate Inhale 1-2 puffs into the lungs every 4 (four) hours as needed.   QC Vitamin D3 50 MCG (2000 UT) Tabs Generic drug: Cholecalciferol Take 2,000 Units by mouth daily with lunch.   simvastatin 40 MG tablet Commonly known as: ZOCOR TAKE 1 TABLET 3 TIMES WEEKLY ON MONDAY, WEDNESDAY AND FRIDAY What changed: See the new instructions.   tamsulosin 0.4 MG Caps capsule Commonly known as: FLOMAX TAKE 1 CAPSULE BY MOUTH EVERY DAY What changed: when to take this   vitamin C 500 MG tablet Commonly known as: ASCORBIC ACID Take 500 mg by mouth daily with lunch.        Signed: [name], DNP, NP-C 10/19/2020, 3:16 PM

## 2020-10-19 NOTE — Anesthesia Preprocedure Evaluation (Addendum)
Anesthesia Evaluation  Patient identified by MRN, date of birth, ID band Patient awake    Reviewed: Allergy & Precautions, NPO status , Patient's Chart, lab work & pertinent test results  History of Anesthesia Complications Negative for: history of anesthetic complications  Airway Mallampati: II  TM Distance: >3 FB Neck ROM: Full    Dental  (+) Dental Advisory Given, Partial Lower, Partial Upper   Pulmonary COPD,  COPD inhaler, Current SmokerPatient did not abstain from smoking.,    Pulmonary exam normal        Cardiovascular hypertension, Normal cardiovascular exam   '20 TTE - EF 55-60%. Left atrial size was mildly dilated. Mild TR and PR.    Neuro/Psych PSYCHIATRIC DISORDERS Depression Dementia negative neurological ROS     GI/Hepatic Neg liver ROS, GERD  Controlled,  Endo/Other  diabetes, Type 2, Oral Hypoglycemic Agents  Renal/GU negative Renal ROS     Musculoskeletal negative musculoskeletal ROS (+)   Abdominal   Peds  Hematology negative hematology ROS (+)   Anesthesia Other Findings Covid test negative   Reproductive/Obstetrics                            Anesthesia Physical Anesthesia Plan  ASA: III  Anesthesia Plan: General   Post-op Pain Management:    Induction: Intravenous  PONV Risk Score and Plan: 2 and Treatment may vary due to age or medical condition, Ondansetron and Aprepitant  Airway Management Planned: Oral ETT  Additional Equipment: None  Intra-op Plan:   Post-operative Plan: Extubation in OR  Informed Consent: I have reviewed the patients History and Physical, chart, labs and discussed the procedure including the risks, benefits and alternatives for the proposed anesthesia with the patient or authorized representative who has indicated his/her understanding and acceptance.     Dental advisory given  Plan Discussed with: CRNA and  Anesthesiologist  Anesthesia Plan Comments:        Anesthesia Quick Evaluation

## 2020-10-19 NOTE — Evaluation (Signed)
Physical Therapy Evaluation and Discharge Patient Details Name: Alexander Booth MRN: [id number] DOB: 12-13-1945 Today's Date: 10/19/2020   History of Present Illness  Pt is a 75 y/o male who presents s/p removal of spinal cord stimulator on 10/19/2020. PMH significant for DM, dementia, peripheral neuropathy, HTN, COPD, L TKR, L1 kyphoplasty 2014, ACDF.    Clinical Impression  Patient evaluated by Physical Therapy with no further acute PT needs identified. All education has been completed and the patient has no further questions. Pt was able to demonstrate transfers and ambulation with gross modified independence and SPC for support. Pt was educated on precautions for comfort, appropriate activity progression, positioning recommendations and car transfer. See below for any follow-up Physical Therapy or equipment needs. PT is signing off. Thank you for this referral.     Follow Up Recommendations No PT follow up;Supervision - Intermittent    Equipment Recommendations  None recommended by PT    Recommendations for Other Services       Precautions / Restrictions Precautions Precautions: Fall;Back Precaution Booklet Issued: No Precaution Comments: Back precautions for comfort Restrictions Weight Bearing Restrictions: No      Mobility  Bed Mobility Overal bed mobility: Modified Independent             General bed mobility comments: HOB elevated. No assist to scoot around to/from EOB    Transfers Overall transfer level: Modified independent Equipment used: None             General transfer comment: Flexed trunk but able to power-up to full stand without assistance. Good control to descend back down to bed.  Ambulation/Gait Ambulation/Gait assistance: Modified independent (Device/Increase time) Gait Distance (Feet): 300 Feet Assistive device: Straight cane Gait Pattern/deviations: Festinating;Shuffle;Trunk flexed;Narrow base of support Gait velocity: Decreased Gait  velocity interpretation: <1.8 ft/sec, indicate of risk for recurrent falls General Gait Details: Parkinsonian gait pattern with quick taps of the cane down for support as he ambulates.  Stairs Stairs: Yes Stairs assistance: Supervision Stair Management: One rail Right;Step to pattern;Forwards Number of Stairs: 2 General stair comments: Limited by IV line but negotiated well without unsteadiness or LOB.  Wheelchair Mobility    Modified Rankin (Stroke Patients Only)       Balance Overall balance assessment: Needs assistance Sitting-balance support: Feet supported;No upper extremity supported Sitting balance-Leahy Scale: Fair Sitting balance - Comments: Mild posterior lean to don pants Postural control: Posterior lean Standing balance support: No upper extremity supported;During functional activity Standing balance-Leahy Scale: Fair                               Pertinent Vitals/Pain Pain Assessment: Faces Faces Pain Scale: Hurts a little bit Pain Location: Incision site, L hip Pain Descriptors / Indicators: Operative site guarding Pain Intervention(s): Limited activity within patient's tolerance;Monitored during session;Repositioned    Home Living Family/patient expects to be discharged to:: Private residence Living Arrangements: Spouse/significant other Available Help at Discharge: Family;Available 24 hours/day Type of Home: House Home Access: Stairs to enter Entrance Stairs-Rails: Right;Left;Can reach both Entrance Stairs-Number of Steps: 4 Home Layout: One level Home Equipment: Shower seat;Grab bars - tub/shower;Walker - 2 wheels;Cane - single point      Prior Function Level of Independence: Needs assistance   Gait / Transfers Assistance Needed: Ambulates with a cane at times but also uses the walker frequently due to pain           Hand Dominance  Dominant Hand: Right    Extremity/Trunk Assessment   Upper Extremity Assessment Upper  Extremity Assessment: Overall WFL for tasks assessed    Lower Extremity Assessment Lower Extremity Assessment: Overall WFL for tasks assessed (Pt reports significant improvement in pain since prior to surgery.)    Cervical / Trunk Assessment Cervical / Trunk Assessment: Kyphotic  Communication   Communication: HOH  Cognition Arousal/Alertness: Awake/alert Behavior During Therapy: WFL for tasks assessed/performed Overall Cognitive Status: Within Functional Limits for tasks assessed                                        General Comments      Exercises     Assessment/Plan    PT Assessment Patent does not need any further PT services  PT Problem List         PT Treatment Interventions      PT Goals (Current goals can be found in the Care Plan section)  Acute Rehab PT Goals Patient Stated Goal: Home ASAP PT Goal Formulation: All assessment and education complete, DC therapy    Frequency     Barriers to discharge        Co-evaluation               AM-PAC PT "6 Clicks" Mobility  Outcome Measure Help needed turning from your back to your side while in a flat bed without using bedrails?: None Help needed moving from lying on your back to sitting on the side of a flat bed without using bedrails?: None Help needed moving to and from a bed to a chair (including a wheelchair)?: None Help needed standing up from a chair using your arms (e.g., wheelchair or bedside chair)?: None Help needed to walk in hospital room?: None Help needed climbing 3-5 steps with a railing? : None 6 Click Score: 24    End of Session Equipment Utilized During Treatment: Gait belt Activity Tolerance: Patient tolerated treatment well Patient left: in bed;with call bell/phone within reach;with SCD's reapplied;with family/visitor present Nurse Communication: Mobility status PT Visit Diagnosis: Pain Pain - part of body:  (back, L hip)    Time: 1345-1410 PT Time Calculation  (min) (ACUTE ONLY): 25 min   Charges:   PT Evaluation $PT Eval Low Complexity: 1 Low PT Treatments $Gait Training: 8-22 mins        Rolinda Roan, PT, DPT Acute Rehabilitation Services Pager: [phone] Office: [phone]   Thelma Comp 10/19/2020, 2:39 PM

## 2020-10-20 ENCOUNTER — Encounter (HOSPITAL_COMMUNITY): Payer: Self-pay | Admitting: Neurosurgery

## 2020-10-20 ENCOUNTER — Other Ambulatory Visit: Payer: Self-pay | Admitting: Internal Medicine

## 2020-10-24 ENCOUNTER — Other Ambulatory Visit: Payer: Self-pay | Admitting: Internal Medicine

## 2020-10-24 DIAGNOSIS — J449 Chronic obstructive pulmonary disease, unspecified: Secondary | ICD-10-CM | POA: Diagnosis not present

## 2020-10-26 ENCOUNTER — Other Ambulatory Visit: Payer: Self-pay | Admitting: Internal Medicine

## 2020-10-26 DIAGNOSIS — G4700 Insomnia, unspecified: Secondary | ICD-10-CM

## 2020-10-26 DIAGNOSIS — F32A Depression, unspecified: Secondary | ICD-10-CM

## 2020-10-26 DIAGNOSIS — F03918 Unspecified dementia, unspecified severity, with other behavioral disturbance: Secondary | ICD-10-CM

## 2020-10-26 DIAGNOSIS — F0391 Unspecified dementia with behavioral disturbance: Secondary | ICD-10-CM

## 2020-10-27 ENCOUNTER — Other Ambulatory Visit: Payer: Self-pay

## 2020-10-27 ENCOUNTER — Encounter: Payer: Self-pay | Admitting: Internal Medicine

## 2020-10-27 ENCOUNTER — Ambulatory Visit (INDEPENDENT_AMBULATORY_CARE_PROVIDER_SITE_OTHER): Payer: Medicare HMO | Admitting: Internal Medicine

## 2020-10-27 VITALS — BP 140/74 | HR 75 | Temp 98.1°F | Ht 72.0 in | Wt 190.0 lb

## 2020-10-27 DIAGNOSIS — G8929 Other chronic pain: Secondary | ICD-10-CM

## 2020-10-27 DIAGNOSIS — N401 Enlarged prostate with lower urinary tract symptoms: Secondary | ICD-10-CM | POA: Insufficient documentation

## 2020-10-27 DIAGNOSIS — Z125 Encounter for screening for malignant neoplasm of prostate: Secondary | ICD-10-CM

## 2020-10-27 DIAGNOSIS — G20 Parkinson's disease: Secondary | ICD-10-CM | POA: Insufficient documentation

## 2020-10-27 DIAGNOSIS — F339 Major depressive disorder, recurrent, unspecified: Secondary | ICD-10-CM | POA: Diagnosis not present

## 2020-10-27 DIAGNOSIS — I10 Essential (primary) hypertension: Secondary | ICD-10-CM | POA: Diagnosis not present

## 2020-10-27 DIAGNOSIS — G479 Sleep disorder, unspecified: Secondary | ICD-10-CM

## 2020-10-27 DIAGNOSIS — F17210 Nicotine dependence, cigarettes, uncomplicated: Secondary | ICD-10-CM

## 2020-10-27 DIAGNOSIS — J449 Chronic obstructive pulmonary disease, unspecified: Secondary | ICD-10-CM

## 2020-10-27 DIAGNOSIS — G63 Polyneuropathy in diseases classified elsewhere: Secondary | ICD-10-CM

## 2020-10-27 DIAGNOSIS — N400 Enlarged prostate without lower urinary tract symptoms: Secondary | ICD-10-CM | POA: Insufficient documentation

## 2020-10-27 DIAGNOSIS — E785 Hyperlipidemia, unspecified: Secondary | ICD-10-CM

## 2020-10-27 DIAGNOSIS — Z0000 Encounter for general adult medical examination without abnormal findings: Secondary | ICD-10-CM | POA: Diagnosis not present

## 2020-10-27 DIAGNOSIS — G300 Alzheimer's disease with early onset: Secondary | ICD-10-CM | POA: Diagnosis not present

## 2020-10-27 DIAGNOSIS — G20A1 Parkinson's disease without dyskinesia, without mention of fluctuations: Secondary | ICD-10-CM | POA: Insufficient documentation

## 2020-10-27 DIAGNOSIS — E118 Type 2 diabetes mellitus with unspecified complications: Secondary | ICD-10-CM

## 2020-10-27 DIAGNOSIS — M549 Dorsalgia, unspecified: Secondary | ICD-10-CM

## 2020-10-27 DIAGNOSIS — K219 Gastro-esophageal reflux disease without esophagitis: Secondary | ICD-10-CM | POA: Diagnosis not present

## 2020-10-27 DIAGNOSIS — F0281 Dementia in other diseases classified elsewhere with behavioral disturbance: Secondary | ICD-10-CM

## 2020-10-27 NOTE — Assessment & Plan Note (Signed)
A1c reviewed Urine microalbumin today Encouraged him to consume a low carb diet Continue Metformin Encourage routine foot exams Encourage routine eye exams Immunizations UTD

## 2020-10-27 NOTE — Patient Instructions (Signed)
Health Maintenance After Age 77 After age 77, you are at a higher risk for certain long-term diseases and infections as well as injuries from falls. Falls are a major cause of broken bones and head injuries in people who are older than age 77. Getting regular preventive care can help to keep you healthy and well. Preventive care includes getting regular testing and making lifestyle changes as recommended by your health care provider. Talk with your health care provider about:  Which screenings and tests you should have. A screening is a test that checks for a disease when you have no symptoms.  A diet and exercise plan that is right for you. What should I know about screenings and tests to prevent falls? Screening and testing are the best ways to find a health problem early. Early diagnosis and treatment give you the best chance of managing medical conditions that are common after age 77. Certain conditions and lifestyle choices may make you more likely to have a fall. Your health care provider may recommend:  Regular vision checks. Poor vision and conditions such as cataracts can make you more likely to have a fall. If you wear glasses, make sure to get your prescription updated if your vision changes.  Medicine review. Work with your health care provider to regularly review all of the medicines you are taking, including over-the-counter medicines. Ask your health care provider about any side effects that may make you more likely to have a fall. Tell your health care provider if any medicines that you take make you feel dizzy or sleepy.  Osteoporosis screening. Osteoporosis is a condition that causes the bones to get weaker. This can make the bones weak and cause them to break more easily.  Blood pressure screening. Blood pressure changes and medicines to control blood pressure can make you feel dizzy.  Strength and balance checks. Your health care provider may recommend certain tests to check your  strength and balance while standing, walking, or changing positions.  Foot health exam. Foot pain and numbness, as well as not wearing proper footwear, can make you more likely to have a fall.  Depression screening. You may be more likely to have a fall if you have a fear of falling, feel emotionally low, or feel unable to do activities that you used to do.  Alcohol use screening. Using too much alcohol can affect your balance and may make you more likely to have a fall. What actions can I take to lower my risk of falls? General instructions  Talk with your health care provider about your risks for falling. Tell your health care provider if: ? You fall. Be sure to tell your health care provider about all falls, even ones that seem minor. ? You feel dizzy, sleepy, or off-balance.  Take over-the-counter and prescription medicines only as told by your health care provider. These include any supplements.  Eat a healthy diet and maintain a healthy weight. A healthy diet includes low-fat dairy products, low-fat (lean) meats, and fiber from whole grains, beans, and lots of fruits and vegetables. Home safety  Remove any tripping hazards, such as rugs, cords, and clutter.  Install safety equipment such as grab bars in bathrooms and safety rails on stairs.  Keep rooms and walkways well-lit. Activity  Follow a regular exercise program to stay fit. This will help you maintain your balance. Ask your health care provider what types of exercise are appropriate for you.  If you need a cane or walker,   use it as recommended by your health care provider.  Wear supportive shoes that have nonskid soles.   Lifestyle  Do not drink alcohol if your health care provider tells you not to drink.  If you drink alcohol, limit how much you have: ? 0-1 drink a day for women. ? 0-2 drinks a day for men.  Be aware of how much alcohol is in your drink. In the [place] one drink equals one typical bottle of beer (12  oz), one-half glass of wine (5 oz), or one shot of hard liquor (1 oz).  Do not use any products that contain nicotine or tobacco, such as cigarettes and e-cigarettes. If you need help quitting, ask your health care provider. Summary  Having a healthy lifestyle and getting preventive care can help to protect your health and wellness after age 77.  Screening and testing are the best way to find a health problem early and help you avoid having a fall. Early diagnosis and treatment give you the best chance for managing medical conditions that are more common for people who are older than age 77.  Falls are a major cause of broken bones and head injuries in people who are older than age 77. Take precautions to prevent a fall at home.  Work with your health care provider to learn what changes you can make to improve your health and wellness and to prevent falls. This information is not intended to replace advice given to you by your health care provider. Make sure you discuss any questions you have with your health care provider. Document Revised: 10/29/2018 Document Reviewed: 05/21/2017 Elsevier Patient Education  2021 [employer]vier Inc.  

## 2020-10-27 NOTE — Assessment & Plan Note (Signed)
Managed with Sinemet He will continue to follow with neurology

## 2020-10-27 NOTE — Progress Notes (Signed)
HPI:  Patient presents to the clinic today for subsequent annual Medicare wellness exam.  He is also due to follow-up chronic conditions.  COPD: He reports chronic cough and shortness of breath.  This is managed on Breo and Albuterol as needed.  There are no PFTs on file.  He has seen pulmonology in the past but is not currently following with them.  Depression: Chronic, managed on Citalopram, Depakote and Lorazepam as needed.  He takes Mirtazapine at night for sleep.  He is not currently seeing a therapist.  He denies anxiety, SI/HI.  HLD: His last LDL was 92, triglycerides 378, 04/2019.  He denies myalgias on Simvastatin.  He consumes a low-fat diet.  HTN: His BP today is 140/74.  He is taking Furosemide as prescribed.  ECG from 12/2019 reviewed.  Chronic Low Back Pain with Neuropathy: He recently had his spinal cord stimulator removed.  He takes Meloxicam, Methocarbamol, Hydrocodone and Gabapentin as prescribed by Dr. [name].  Parkinson's Dementia with Behavioral Disturbances: Managed with Sinemet, Aricept, Namenda and Depakote.  He takes Lorazepam as needed for agitation.  He follows with Dr. Carles Collet.  DM2: His last A1c was 7.1%, 09/2020.  He does not check his sugars.  He is taking Metformin as prescribed.  He does not check his feet routinely.  GERD: Managed with Famotidine at bedtime with some relief.  There is no upper GI on file.  BPH: Managed on Flomax.  He does not follow with urology.  Past Medical History:  Diagnosis Date  . COPD (chronic obstructive pulmonary disease) ([place])   . Depression   . Erectile dysfunction    He's had a hx of   . Hypercholesterolemia   . Hypertension    Essential  . Lumbosacral disc disease    with chronic low back pain, has implanted nerve stimulator  . Peripheral neuropathy   . Presenile dementia   . Type 2 diabetes mellitus ([place]) 2010   takes oral meds    Current Outpatient Medications  Medication Sig Dispense Refill  . Accu-Chek Softclix  Lancets lancets Check sugar 2 times daily. DX E11.8 200 each 3  . Albuterol Sulfate (PROAIR RESPICLICK) 742 (90 Base) MCG/ACT AEPB Inhale 1-2 puffs into the lungs every 4 (four) hours as needed. 1 each 11  . aspirin EC 81 MG tablet Take 81 mg by mouth daily with breakfast.    . Blood Glucose Monitoring Suppl (ACCU-CHEK AVIVA PLUS) w/Device KIT Check sugar 2 times daily. DX E11.8 1 kit 12  . budesonide-formoterol (SYMBICORT) 80-4.5 MCG/ACT inhaler Inhale 2 puffs into the lungs daily as needed (COPD).    . carbidopa-levodopa (SINEMET IR) 25-100 MG tablet TAKE 1 TABLET BY MOUTH THREE TIMES A DAY (Patient taking differently: Take 1 tablet by mouth in the morning and at bedtime. Morning & lunch) 270 tablet 0  . Cholecalciferol (QC VITAMIN D3) 50 MCG (2000 UT) TABS Take 2,000 Units by mouth daily with lunch.    . citalopram (CELEXA) 20 MG tablet TAKE 1 TABLET EVERY DAY 90 tablet 1  . divalproex (DEPAKOTE ER) 500 MG 24 hr tablet TAKE 2 TABLETS EVERY DAY (Patient taking differently: Take 500 mg by mouth in the morning and at bedtime. Breakfast & evening) 180 tablet 0  . donepezil (ARICEPT) 10 MG tablet TAKE 1 TABLET EVERY MORNING (Patient taking differently: Take 10 mg by mouth daily with breakfast.) 90 tablet 0  . fluticasone furoate-vilanterol (BREO ELLIPTA) 100-25 MCG/INH AEPB Inhale 1 puff into the lungs daily.    Marland Kitchen  furosemide (LASIX) 20 MG tablet Take 1 tablet (20 mg total) by mouth 2 (two) times daily with breakfast and lunch. 180 tablet 3  . gabapentin (NEURONTIN) 400 MG capsule TAKE 3 CAPSULES AT BEDTIME AND MAY REPEAT DOSE ONE TIME IF NEEDED (Patient taking differently: Take 800 mg by mouth at bedtime.) 270 capsule 0  . glucose blood (ACCU-CHEK AVIVA PLUS) test strip Check sugar 2 times daily. DX E11.8 200 each 3  . HYDROcodone-acetaminophen (NORCO) 10-325 MG tablet Take 1 tablet by mouth in the morning, at noon, and at bedtime.    Marland Kitchen LORazepam (ATIVAN) 0.5 MG tablet Take 1 tablet (0.5 mg total) by  mouth at bedtime. 30 tablet 0  . meloxicam (MOBIC) 15 MG tablet TAKE 1 TABLET EVERY DAY WITH SUPPER 90 tablet 0  . memantine (NAMENDA XR) 7 MG CP24 24 hr capsule TAKE 1 CAPSULE (7 MG TOTAL) BY MOUTH EVERY MORNING. 90 capsule 1  . metFORMIN (GLUCOPHAGE) 500 MG tablet Take 1 tablet (500 mg total) by mouth See admin instructions. Take one tablet (500 mg) by mouth twice daily - with lunch and with supper (Patient taking differently: Take 500 mg by mouth in the morning and at bedtime. Lunch & evening) 180 tablet 1  . methocarbamol (ROBAXIN) 750 MG tablet Take 1 tablet (750 mg total) by mouth in the morning and at bedtime. (Patient taking differently: Take 750 mg by mouth every 8 (eight) hours.) 180 tablet 1  . mirtazapine (REMERON) 45 MG tablet Take 1 tablet (45 mg total) by mouth at bedtime. 90 tablet 1  . mupirocin ointment (BACTROBAN) 2 % Apply 1 application topically 2 (two) times daily. (Patient taking differently: Apply 1 application topically 2 (two) times daily as needed (wound care).) 22 g 0  . nitroGLYCERIN (NITROSTAT) 0.4 MG SL tablet Place 0.4 mg under the tongue every 5 (five) minutes x 3 doses as needed for chest pain.    . simvastatin (ZOCOR) 40 MG tablet TAKE 1 TABLET 3 TIMES WEEKLY ON MONDAY, WEDNESDAY AND FRIDAY 39 tablet 0  . tamsulosin (FLOMAX) 0.4 MG CAPS capsule TAKE 1 CAPSULE BY MOUTH EVERY DAY (Patient taking differently: Take 0.4 mg by mouth daily after supper.) 90 capsule 3  . vitamin C (ASCORBIC ACID) 500 MG tablet Take 500 mg by mouth daily with lunch.     No current facility-administered medications for this visit.    Allergies  Allergen Reactions  . Codeine Hives and Itching    Family History  Problem Relation Age of Onset  . Multiple myeloma Mother   . Diabetes Mother   . Diabetes Sister   . Lymphoma Sister   . Stroke Neg Hx     Social History   Socioeconomic History  . Marital status: Married    Spouse name: Vaughan Basta  . Number of children: 3  . Years of  education: 61  . Highest education level: Not on file  Occupational History  . Occupation: retired    Comment: retired  Tobacco Use  . Smoking status: Current Every Day Smoker    Packs/day: 1.00    Years: 40.00    Pack years: 40.00  . Smokeless tobacco: Never Used  Vaping Use  . Vaping Use: Never used  Substance and Sexual Activity  . Alcohol use: No    Alcohol/week: 0.0 standard drinks  . Drug use: No  . Sexual activity: Not on file  Other Topics Concern  . Not on file  Social History Narrative   Patient is  right handed   Patient resides with wife,consumes 2-3 cups of caffeine daily   Social Determinants of Health   Financial Resource Strain: Not on file  Food Insecurity: Not on file  Transportation Needs: Not on file  Physical Activity: Not on file  Stress: Not on file  Social Connections: Not on file  Intimate Partner Violence: Not on file    Hospitiliaztions: 3/31 removal of spinal cord stimulator.  Health Maintenance:    Flu: 03/2020             Tetanus: 08/2012             Pneumovax: 09/2015             Prevnar: 04/2018  Covid: Pfizer x 3             Zostavax: 09/2012             PSA: 04/2019             Colon Screening: 02/2015             Eye Doctor: annually             Dental Exam: as needed    Providers:   PCP: Webb Silversmith, NP             Podiatry: Dr.  Amalia Hailey             Pain Speciliast: Dr. [name]             Pulmonologist: Dr. Melvyn Novas  Neurology: Dr. Carles Collet    I have personally reviewed and have noted:  1. The patient's medical and social history 2. Their use of alcohol, tobacco or illicit drugs 3. Their current medications and supplements 4. The patient's functional ability including ADL's, fall risks, home safety risks and hearing or visual impairment. 5. Diet and physical activities 6. Evidence for depression or mood disorder  Subjective:   Review of Systems:   Constitutional: Denies fever, malaise, fatigue, headache or abrupt weight  changes.  HEENT: Denies eye pain, eye redness, ear pain, ringing in the ears, wax buildup, runny nose, nasal congestion, bloody nose, or sore throat. Respiratory: Patient reports chronic cough and shortness of breath.  Denies difficulty breathing.   Cardiovascular: Patient reports intermittent swelling of feet/legs.  Denies chest pain, chest tightness, palpitations or swelling in the hands.  Gastrointestinal: Pt reports intermittent abdominal pain. Denies bloating, constipation, diarrhea or blood in the stool.  GU: Denies urgency, frequency, pain with urination, burning sensation, blood in urine, odor or discharge. Musculoskeletal: Patient reports chronic back pain, difficulty with gait, multiple falls.  Denies decrease in range of motion, or joint  swelling.  Skin: Pt reports incision to midline back, posterior right hip. Denies redness, rashes, lesions or ulcercations.  Neurological: Patient reports numbness and tingling in his lower extremities.  Denies dizziness, difficulty with memory, difficulty with speech or problems with balance and coordination.  Psych: Patient has a history of depression.  Denies anxiety, SI/HI.  No other specific complaints in a complete review of systems (except as listed in HPI above).  Objective:  PE:  BP 140/74   Pulse 75   Temp 98.1 F (36.7 C) (Temporal)   Ht 6' (1.829 m)   Wt 190 lb (86.2 kg)   SpO2 93%   BMI 25.77 kg/m   Wt Readings from Last 3 Encounters:  10/19/20 190 lb (86.2 kg)  04/20/20 191 lb (86.6 kg)  01/31/20 188 lb (85.3 kg)    General:  Appears his stated age, chronically ill-appearing, in NAD. Skin: Warm, dry and intact. Midline incision to midline thoracic back CDI. Incision to posterior right hip CDI. HEENT: Head: normal shape and size; Eyes: sclera white and EOMs intact;  Neck: Neck supple, trachea midline. No masses, lumps or thyromegaly present.  Cardiovascular: Normal rate and rhythm. S1,S2 noted.  No murmur, rubs or gallops  noted. 1+ pitting BLE edema. No carotid bruits noted. Pulmonary/Chest: Normal effort and positive vesicular breath sounds. No respiratory distress. No wheezes, rales or ronchi noted.  Abdomen: Soft and nontender. Normal bowel sounds. No distention or masses noted.  Musculoskeletal: Positive bradykinesia.  Strength 5/5 BUE/BLE.  Gait slow and steady with use of cane. Neurological: Alert and oriented to person and place.  Positive tremor. Psychiatric: Mood and affect normal. Behavior is normal. Judgment and thought content normal.    BMET    Component Value Date/Time   NA 137 10/19/2020 0917   NA 141 10/11/2014 1122   K 4.0 10/19/2020 0917   CL 98 10/19/2020 0917   CO2 29 10/19/2020 0917   GLUCOSE 154 (H) 10/19/2020 0917   BUN 16 10/19/2020 0917   BUN 11 10/11/2014 1122   CREATININE 1.23 10/19/2020 0917   CREATININE 0.72 03/26/2013 1608   CALCIUM 9.1 10/19/2020 0917   GFRNONAA >60 10/19/2020 0917   GFRAA >60 12/25/2019 0358    Lipid Panel     Component Value Date/Time   CHOL 201 (H) 04/27/2019 1116   TRIG 378.0 (H) 04/27/2019 1116   HDL 33.50 (L) 04/27/2019 1116   CHOLHDL 6 04/27/2019 1116   VLDL 75.6 (H) 04/27/2019 1116   LDLCALC 97 03/20/2017 1016    CBC    Component Value Date/Time   WBC 5.7 10/19/2020 0917   RBC 3.88 (L) 10/19/2020 0917   HGB 13.2 10/19/2020 0917   HCT 38.8 (L) 10/19/2020 0917   PLT 181 10/19/2020 0917   MCV 100.0 10/19/2020 0917   MCH 34.0 10/19/2020 0917   MCHC 34.0 10/19/2020 0917   RDW 11.9 10/19/2020 0917   RDW 13.5 10/11/2014 1122   LYMPHSABS 0.9 12/25/2019 0358   LYMPHSABS 1.3 10/11/2014 1122   MONOABS 0.5 12/25/2019 0358   EOSABS 0.0 12/25/2019 0358   EOSABS 0.0 10/11/2014 1122   BASOSABS 0.0 12/25/2019 0358   BASOSABS 0.0 10/11/2014 1122    Hgb A1C Lab Results  Component Value Date   HGBA1C 7.1 (H) 10/19/2020      Assessment and Plan:   Medicare Annual Wellness Visit:  Diet: He does not like meat. He consumes fruits  and veggies. He tries to avoid fried foods. He drinks mostly coffee and water. Physical activity: Sedentary Depression/mood screen: Chronic, PHQ 9 score of 4 Hearing: Intact to whispered voice Visual acuity: Grossly normal, performs annual eye exam  ADLs: Capable Fall risk: High Home safety: Good Cognitive evaluation: Intact to naming.  Difficulty with orientation, recall and repetition. EOL planning: No adv directives, DNR/ I agree  Preventative Medicine: Flu shot UTD.  Tetanus, Pneumovax, Prevnar, Zostavax and COVID UTD.  He declines Shingrix vaccine at this time.  Encouraged him to get his COVID booster.  He no longer needs to screen for colon cancer.  He declines lung cancer screening.  Encouraged him to consume a balanced diet.  Advised him to see an eye doctor and dentist annually.  Will check PSA, HFP, lipid and urine microalbumin today.  Due dates for screening exam given the patient as part of his AVS.  Next appointment: 6 months, follow-up chronic conditions.   Webb Silversmith, NP This visit occurred during the SARS-CoV-2 public health emergency.  Safety protocols were in place, including screening questions prior to the visit, additional usage of staff PPE, and extensive cleaning of exam room while observing appropriate contact time as indicated for disinfecting solutions.

## 2020-10-27 NOTE — Addendum Note (Signed)
Addended by: [name] on: 10/27/2020 03:35 PM   Modules accepted: Orders

## 2020-10-27 NOTE — Assessment & Plan Note (Signed)
He declines smoking cessation Continue Breo and Albuterol He declines lung cancer screening  He does not wish to follow with pulmonology this time

## 2020-10-27 NOTE — Assessment & Plan Note (Signed)
Persistent abdominal pain. Will increase famotidine to 20 mg at bedtime

## 2020-10-27 NOTE — Assessment & Plan Note (Signed)
Managed with Aricept, Namenda and Depakote He will continue to follow with neurology

## 2020-10-27 NOTE — Assessment & Plan Note (Signed)
Continue Flomax PSA today

## 2020-10-27 NOTE — Assessment & Plan Note (Signed)
Stable on Citalopram, Depakote, Lorazepam and Mirtazapine Support offered We will monitor

## 2020-10-27 NOTE — Assessment & Plan Note (Signed)
Continue Gabapentin per pain management

## 2020-10-27 NOTE — Assessment & Plan Note (Signed)
Reasonable control given age Given edema, will increase furosemide to 40 mg in the a.m. and continue 20 mg in the p.m. Renal function reviewed Encourage low-salt diet and elevation of legs

## 2020-10-27 NOTE — Assessment & Plan Note (Signed)
HFP and lipid profile today Encouraged him to consume a low-fat diet Continue Simvastatin

## 2020-10-27 NOTE — Assessment & Plan Note (Signed)
Recently had spinal cord stimulator removed Continue Meloxicam, Methocarbamol, Hydrocodone and Gabapentin He will continue to follow with pain management

## 2020-10-27 NOTE — Assessment & Plan Note (Signed)
Continue Mirtazapine 

## 2020-10-28 LAB — HEPATIC FUNCTION PANEL
AG Ratio: 1.7 (calc) (ref 1.0–2.5)
ALT: 6 U/L — ABNORMAL LOW (ref 9–46)
AST: 12 U/L (ref 10–35)
Albumin: 4.3 g/dL (ref 3.6–5.1)
Alkaline phosphatase (APISO): 57 U/L (ref 35–144)
Bilirubin, Direct: 0.1 mg/dL (ref 0.0–0.2)
Globulin: 2.6 g/dL (calc) (ref 1.9–3.7)
Indirect Bilirubin: 0.2 mg/dL (calc) (ref 0.2–1.2)
Total Bilirubin: 0.3 mg/dL (ref 0.2–1.2)
Total Protein: 6.9 g/dL (ref 6.1–8.1)

## 2020-10-28 LAB — LIPID PANEL
Cholesterol: 221 mg/dL — ABNORMAL HIGH (ref <200)
HDL: 35 mg/dL — ABNORMAL LOW (ref >=40)
Non-HDL Cholesterol (Calc): 186 mg/dL (calc) — ABNORMAL HIGH (ref <130)
Total CHOL/HDL Ratio: 6.3 (calc) — ABNORMAL HIGH (ref <5.0)
Triglycerides: 527 mg/dL — ABNORMAL HIGH (ref <150)

## 2020-10-28 LAB — PSA: PSA: 0.56 ng/mL (ref <=4.0)

## 2020-10-28 LAB — MICROALBUMIN / CREATININE URINE RATIO
Creatinine, Urine: 29 mg/dL (ref 20–320)
Microalb Creat Ratio: 10 mcg/mg creat (ref <30)
Microalb, Ur: 0.3 mg/dL

## 2020-10-30 ENCOUNTER — Encounter: Payer: Self-pay | Admitting: Internal Medicine

## 2020-10-31 MED ORDER — SIMVASTATIN 40 MG PO TABS: 40.0000 mg | ORAL_TABLET | Freq: Every day | ORAL | 0 refills | Status: DC

## 2020-10-31 NOTE — Telephone Encounter (Signed)
carbidopa-levodopa   Was prescribed by Neuro please advise

## 2020-10-31 NOTE — Addendum Note (Signed)
Addended by: Lurlean Nanny on: 10/31/2020 05:38 PM   Modules accepted: Orders

## 2020-11-09 ENCOUNTER — Other Ambulatory Visit: Payer: Self-pay | Admitting: Internal Medicine

## 2020-11-09 DIAGNOSIS — F03918 Unspecified dementia, unspecified severity, with other behavioral disturbance: Secondary | ICD-10-CM

## 2020-11-09 DIAGNOSIS — F0391 Unspecified dementia with behavioral disturbance: Secondary | ICD-10-CM

## 2020-11-09 DIAGNOSIS — G4700 Insomnia, unspecified: Secondary | ICD-10-CM

## 2020-11-09 NOTE — Telephone Encounter (Signed)
Last filled 10/06/2020... please advise

## 2020-11-15 ENCOUNTER — Encounter: Payer: Self-pay | Admitting: Internal Medicine

## 2020-11-23 DIAGNOSIS — J449 Chronic obstructive pulmonary disease, unspecified: Secondary | ICD-10-CM | POA: Diagnosis not present

## 2020-11-28 DIAGNOSIS — M545 Low back pain, unspecified: Secondary | ICD-10-CM | POA: Diagnosis not present

## 2020-11-28 DIAGNOSIS — M5416 Radiculopathy, lumbar region: Secondary | ICD-10-CM | POA: Diagnosis not present

## 2020-11-28 DIAGNOSIS — Z7689 Persons encountering health services in other specified circumstances: Secondary | ICD-10-CM | POA: Diagnosis not present

## 2020-11-29 DIAGNOSIS — M4807 Spinal stenosis, lumbosacral region: Secondary | ICD-10-CM | POA: Diagnosis not present

## 2020-11-29 DIAGNOSIS — M542 Cervicalgia: Secondary | ICD-10-CM | POA: Diagnosis not present

## 2020-11-29 DIAGNOSIS — M5416 Radiculopathy, lumbar region: Secondary | ICD-10-CM | POA: Diagnosis not present

## 2020-12-05 MED ORDER — SIMVASTATIN 40 MG PO TABS: 40.0000 mg | ORAL_TABLET | Freq: Every day | ORAL | 0 refills | Status: DC

## 2020-12-05 NOTE — Addendum Note (Signed)
Addended by: Lurlean Nanny on: 12/05/2020 04:03 PM   Modules accepted: Orders

## 2020-12-12 ENCOUNTER — Other Ambulatory Visit: Payer: Self-pay | Admitting: Internal Medicine

## 2020-12-12 NOTE — Telephone Encounter (Signed)
Requested medication (s) are due for refill today: yes  Requested medication (s) are on the active medication list: yes  Last refill:  11/11/20  Future visit scheduled: no  Notes to clinic:  not delegated    Requested Prescriptions  Pending Prescriptions Disp Refills   LORazepam (ATIVAN) 0.5 MG tablet [Pharmacy Med Name: LORAZEPAM 0.5 MG TABLET] 30 tablet 0    Sig: TAKE 1 TABLET BY MOUTH EVERYDAY AT BEDTIME      There is no refill protocol information for this order

## 2020-12-24 DIAGNOSIS — J449 Chronic obstructive pulmonary disease, unspecified: Secondary | ICD-10-CM | POA: Diagnosis not present

## 2020-12-25 DIAGNOSIS — M5416 Radiculopathy, lumbar region: Secondary | ICD-10-CM | POA: Diagnosis not present

## 2020-12-25 DIAGNOSIS — M4807 Spinal stenosis, lumbosacral region: Secondary | ICD-10-CM | POA: Diagnosis not present

## 2020-12-25 DIAGNOSIS — M545 Low back pain, unspecified: Secondary | ICD-10-CM | POA: Diagnosis not present

## 2020-12-25 DIAGNOSIS — G20 Parkinson's disease: Secondary | ICD-10-CM | POA: Diagnosis not present

## 2021-01-05 ENCOUNTER — Other Ambulatory Visit: Payer: Self-pay | Admitting: Neurosurgery

## 2021-01-10 ENCOUNTER — Encounter: Payer: Self-pay | Admitting: Internal Medicine

## 2021-01-11 ENCOUNTER — Ambulatory Visit: Payer: Self-pay

## 2021-01-11 NOTE — Telephone Encounter (Signed)
Patient's daughter called and says about 1-2 weeks ago her father walked out behind the house to some brush piled up near the woods. There was a bucket turned upside down and he was attempting to sit down on the bucket and he slipped off and sat on the ground hitting his tailbone on a big rock. She says he has been having pain to his tailbone since then, especially with sitting. She's given him Tylenol for the pain. No bruising noted, no cut in the skin. No other symptoms noted. Appointment scheduled for tomorrow at 1520 with Webb Silversmith, NP, care advice given, patient's daughter verbalized understanding.    Summary: advise about fall   Patients daughter called in concerned father may have cracked tail bone from fall he had 1-2 weeks ago that bothers him. She wants to know what she should do, xray or that. Please call back to advise      Reason for Disposition  [1] High-risk adult (e.g., age > 63 years, osteoporosis, chronic steroid use) AND [2] still hurts  Answer Assessment - Initial Assessment Questions 1. MECHANISM: "How did the injury happen?"       Slipped off a bucket and hit his bottom on a rock 2. ONSET: "When did the injury happen?" (Minutes or hours ago)      1-2 weeks 3. LOCATION: "Where is the injury located?"      Tailbone 4. SEVERITY: "Can you sit?" "Can you walk?"      Can walk. Sitting hurts really bad 5. PAIN: "Is there pain?" If Yes, ask: "How bad is the pain?"    (Scale 1-10; or mild, moderate, severe)     Hurting pretty bad 6. SIZE: For bruises, or swelling, ask: "How large is it?" (e.g., inches or centimeters)      No cut 7. OTHER SYMPTOMS: "Do you have any other symptoms?" (e.g., numbness, back pain)     No 8. PREGNANCY: "Is there any chance you are pregnant?" "When was your last menstrual period?"     N/A  Protocols used: Tailbone Injury-A-AH

## 2021-01-11 NOTE — Telephone Encounter (Signed)
Called pt's daughter

## 2021-01-12 ENCOUNTER — Ambulatory Visit
Admission: RE | Admit: 2021-01-12 | Discharge: 2021-01-12 | Disposition: A | Discharge status: Home / Self Care | Payer: Medicare HMO | Source: Ambulatory Visit | Attending: Internal Medicine | Admitting: Internal Medicine

## 2021-01-12 ENCOUNTER — Encounter: Payer: Self-pay | Admitting: Internal Medicine

## 2021-01-12 ENCOUNTER — Ambulatory Visit (INDEPENDENT_AMBULATORY_CARE_PROVIDER_SITE_OTHER): Payer: Medicare HMO | Admitting: Internal Medicine

## 2021-01-12 ENCOUNTER — Other Ambulatory Visit: Payer: Self-pay

## 2021-01-12 VITALS — BP 134/64 | HR 77 | Temp 97.7°F | Resp 18 | Ht 72.0 in | Wt 181.4 lb

## 2021-01-12 DIAGNOSIS — M533 Sacrococcygeal disorders, not elsewhere classified: Secondary | ICD-10-CM

## 2021-01-12 DIAGNOSIS — M47816 Spondylosis without myelopathy or radiculopathy, lumbar region: Secondary | ICD-10-CM | POA: Diagnosis not present

## 2021-01-12 NOTE — Patient Instructions (Signed)
Tailbone Injury The tailbone is the small bone at the lower end of the backbone (spine). The tailbone can become injured from: A fall. Sitting to row or bike for a long time. Having a baby. This type of injury can be painful. Most tailbone injuries get better on theirown in 4-6 weeks. Follow these instructions at home: Activity Avoid sitting in one place for a long time. Wear proper pads and gear when riding a bike or rowing. Increase your activity as the pain allows. Do exercises as told by your doctor or physical therapist. Managing pain, stiffness, and swelling To lessen pain: Sit on a large, rubber or inflated ring or cushion. Lean forward when you sit. If told, apply ice to the injured area. Put ice in a plastic bag. Place a towel between your skin and the bag. Leave the ice on for 20 minutes, 2-3 times per day. Do this for the first 1-2 days. If told, put heat on the injured area. Do this as often as told by your doctor. Use the heat source that your doctor recommends, such as a moist heat pack or a heating pad. Place a towel between your skin and the heat source. Leave the heat on for 20-30 minutes. Remove the heat if your skin turns bright red. This is very important if you are unable to feel pain, heat, or cold. You may have a greater risk of getting burned. General instructions Take over-the-counter and prescription medicines only as told by your doctor. To prevent or treat trouble pooping (constipation) or pain when pooping, your doctor may suggest that you: Drink enough fluid to keep your pee (urine) pale yellow. Eat foods that are high in fiber. These include fresh fruits and vegetables, whole grains, and beans. Limit foods that are high in fat and sugar. These include fried and sweet foods. Take an over-the-counter or prescription medicine to treat trouble pooping. Keep all follow-up visits as told by your doctor. This is important. Contact a doctor if: Your pain gets  worse. Pooping causes you pain. You cannot poop after 4 days. You have pain during sex. Summary A tailbone injury can happen from a fall, from sitting for a long time to row or bike, or after having a baby. These injuries can be painful. Most tailbone injuries get better on their own in 4-6 weeks. Sit on a large, rubber or inflated ring or cushion to lessen pain. Avoid sitting in one place for a long time. Follow your doctor's suggestions to prevent or treat trouble pooping. This information is not intended to replace advice given to you by your health care provider. Make sure you discuss any questions you have with your healthcare provider. Document Revised: 08/05/2017 Document Reviewed: 08/05/2017 Elsevier Patient Education  2022 [employer].

## 2021-01-13 ENCOUNTER — Other Ambulatory Visit: Payer: Self-pay | Admitting: Internal Medicine

## 2021-01-13 ENCOUNTER — Encounter: Payer: Self-pay | Admitting: Internal Medicine

## 2021-01-13 NOTE — Telephone Encounter (Signed)
Requested medication (s) are due for refill today: yes  Requested medication (s) are on the active medication list: yes  Last refill:  12/12/20  Future visit scheduled: no  Notes to clinic:  med not delegated to NT to RF    Requested Prescriptions  Pending Prescriptions Disp Refills   LORazepam (ATIVAN) 0.5 MG tablet [Pharmacy Med Name: LORAZEPAM 0.5 MG TABLET] 30 tablet 0    Sig: TAKE 1 TABLET BY MOUTH EVERYDAY AT BEDTIME      Not Delegated - Psychiatry:  Anxiolytics/Hypnotics Failed - 01/13/2021  5:03 PM      Failed - This refill cannot be delegated      Failed - Urine Drug Screen completed in last 360 days      Passed - Valid encounter within last 6 months    Recent Outpatient Visits           Yesterday Sacral pain   [facility] [place], Coralie Keens, NP

## 2021-01-13 NOTE — Progress Notes (Signed)
Subjective:    Patient ID: Alexander Booth, male    DOB: 12-17-1945, 75 y.o.   MRN: [id number]  HPI  Pt presents to the clinic today with c/o tailbone pain. He reports this started 2 weeks ago after a fall. He reports he went to sit down on a cinderblock and fell backwards. He describes the pain as sore and achy. The pain does not radiate. It is worse with sitting for long periods of time. He has taken Meloxicam and Hydrocodone with minimal relief of symptoms.   Review of Systems   Past Medical History:  Diagnosis Date   COPD (chronic obstructive pulmonary disease) ([place])    Depression    Erectile dysfunction    He's had a hx of    Hypercholesterolemia    Hypertension    Essential   Lumbosacral disc disease    with chronic low back pain, has implanted nerve stimulator   Peripheral neuropathy    Presenile dementia    Type 2 diabetes mellitus ([place]) 2010   takes oral meds    Current Outpatient Medications  Medication Sig Dispense Refill   Accu-Chek Softclix Lancets lancets Check sugar 2 times daily. DX E11.8 200 each 3   acetaminophen (TYLENOL) 500 MG tablet Take 1,000 mg by mouth every 6 (six) hours as needed for moderate pain.     Albuterol Sulfate (PROAIR RESPICLICK) 462 (90 Base) MCG/ACT AEPB Inhale 1-2 puffs into the lungs every 4 (four) hours as needed. (Patient taking differently: Inhale 1-2 puffs into the lungs every 4 (four) hours as needed (shortness of breath).) 1 each 11   aspirin EC 81 MG tablet Take 81 mg by mouth daily with breakfast.     Blood Glucose Monitoring Suppl (ACCU-CHEK AVIVA PLUS) w/Device KIT Check sugar 2 times daily. DX E11.8 1 kit 12   carbidopa-levodopa (SINEMET IR) 25-100 MG tablet TAKE 1 TABLET THREE TIMES DAILY (AT 7AM, 11AM, 4PM) (Patient taking differently: Take 1 tablet by mouth 2 (two) times daily.) 270 tablet 0   Cholecalciferol (QC VITAMIN D3) 50 MCG (2000 UT) TABS Take 2,000 Units by mouth daily with lunch.     citalopram (CELEXA) 20 MG  tablet TAKE 1 TABLET EVERY DAY (Patient taking differently: Take 20 mg by mouth daily.) 90 tablet 1   cyclobenzaprine (FLEXERIL) 5 MG tablet Take 5 mg by mouth 2 (two) times daily.     divalproex (DEPAKOTE ER) 500 MG 24 hr tablet TAKE 2 TABLETS EVERY DAY (Patient taking differently: Take 500 mg by mouth 2 (two) times daily.) 180 tablet 3   donepezil (ARICEPT) 10 MG tablet TAKE 1 TABLET EVERY MORNING (Patient taking differently: Take 10 mg by mouth daily.) 90 tablet 3   famotidine (PEPCID) 10 MG tablet Take 2 tablets (20 mg total) by mouth daily. 180 tablet 3   fluticasone furoate-vilanterol (BREO ELLIPTA) 100-25 MCG/INH AEPB Inhale 1 puff into the lungs daily.     furosemide (LASIX) 20 MG tablet Take 20 mg by mouth 2 (two) times daily. 270 tablet 3   gabapentin (NEURONTIN) 400 MG capsule TAKE 3 CAPSULES AT BEDTIME AND MAY REPEAT DOSE ONE TIME IF NEEDED (Patient taking differently: Take 800 mg by mouth at bedtime.) 270 capsule 3   glucose blood (ACCU-CHEK AVIVA PLUS) test strip Check sugar 2 times daily. DX E11.8 200 each 3   HYDROcodone-acetaminophen (NORCO) 10-325 MG tablet Take 1 tablet by mouth every 6 (six) hours as needed for moderate pain.     LORazepam (ATIVAN)  0.5 MG tablet TAKE 1 TABLET BY MOUTH EVERYDAY AT BEDTIME (Patient taking differently: Take 0.5 mg by mouth at bedtime.) 30 tablet 0   meloxicam (MOBIC) 15 MG tablet TAKE 1 TABLET EVERY DAY WITH SUPPER (Patient taking differently: Take 15 mg by mouth every evening.) 90 tablet 0   memantine (NAMENDA XR) 7 MG CP24 24 hr capsule TAKE 1 CAPSULE (7 MG TOTAL) BY MOUTH EVERY MORNING. 90 capsule 1   metFORMIN (GLUCOPHAGE) 500 MG tablet TAKE 1 TABLET TWICE DAILY WITH LUNCH AND WITH SUPPER (PER ADMIN INSTRUCTIONS) (Patient taking differently: Take 500 mg by mouth 2 (two) times daily with a meal.) 180 tablet 3   mirtazapine (REMERON) 45 MG tablet TAKE 1 TABLET AT BEDTIME (Patient taking differently: Take 45 mg by mouth.) 90 tablet 3   mupirocin  ointment (BACTROBAN) 2 % Apply 1 application topically 2 (two) times daily. (Patient taking differently: Apply 1 application topically 2 (two) times daily as needed (wound care).) 22 g 0   nitroGLYCERIN (NITROSTAT) 0.4 MG SL tablet Place 0.4 mg under the tongue every 5 (five) minutes x 3 doses as needed for chest pain.     simvastatin (ZOCOR) 40 MG tablet Take 1 tablet (40 mg total) by mouth daily at 6 PM. 90 tablet 0   tamsulosin (FLOMAX) 0.4 MG CAPS capsule TAKE 1 CAPSULE BY MOUTH EVERY DAY (Patient taking differently: Take 0.4 mg by mouth daily after supper.) 90 capsule 3   vitamin C (ASCORBIC ACID) 500 MG tablet Take 500 mg by mouth daily with lunch.     No current facility-administered medications for this visit.    Allergies  Allergen Reactions   Codeine Hives and Itching    Family History  Problem Relation Age of Onset   Multiple myeloma Mother    Diabetes Mother    Diabetes Sister    Lymphoma Sister    Stroke Neg Hx     Social History   Socioeconomic History   Marital status: Married    Spouse name: Vaughan Basta   Number of children: 3   Years of education: 12   Highest education level: Not on file  Occupational History   Occupation: retired    Comment: retired  Tobacco Use   Smoking status: Every Day    Packs/day: 1.00    Years: 40.00    Pack years: 40.00    Types: Cigarettes   Smokeless tobacco: Never  Vaping Use   Vaping Use: Never used  Substance and Sexual Activity   Alcohol use: No    Alcohol/week: 0.0 standard drinks   Drug use: No   Sexual activity: Not on file  Other Topics Concern   Not on file  Social History Narrative   Patient is right handed   Patient resides with wife,consumes 2-3 cups of caffeine daily   Social Determinants of Health   Financial Resource Strain: Not on file  Food Insecurity: Not on file  Transportation Needs: Not on file  Physical Activity: Not on file  Stress: Not on file  Social Connections: Not on file  Intimate  Partner Violence: Not on file     Constitutional: Denies fever, malaise, fatigue, headache or abrupt weight changes.  Respiratory: Denies difficulty breathing, shortness of breath, cough or sputum production.   Cardiovascular: Denies chest pain, chest tightness, palpitations or swelling in the hands or feet.  Musculoskeletal: Pt reports tailbone pain. Denies muscle pain or joint swelling.    No other specific complaints in a complete review of  systems (except as listed in HPI above).   Objective:   Physical Exam   BP 134/64 (BP Location: Right Arm, Patient Position: Sitting, Cuff Size: Normal)   Pulse 77   Temp 97.7 F (36.5 C) (Temporal)   Resp 18   Ht 6' (1.829 m)   Wt 181 lb 6.4 oz (82.3 kg)   SpO2 98%   BMI 24.60 kg/m  Wt Readings from Last 3 Encounters:  01/12/21 181 lb 6.4 oz (82.3 kg)  10/27/20 190 lb (86.2 kg)  10/19/20 190 lb (86.2 kg)    General: Appears his stated age, chronically il appearing in NAD. Skin: Warm, dry and intact. No bruising noted. Cardiovascular: Normal rate and rhythm.  Pulmonary/Chest: Normal effort. Musculoskeletal: Difficulty going from a sitting to a standing position. Pain with palpation over the sacrum. Gait slow and steady with use of walker. Neurological: Alert and oriented.    BMET    Component Value Date/Time   NA 137 10/19/2020 0917   NA 141 10/11/2014 1122   K 4.0 10/19/2020 0917   CL 98 10/19/2020 0917   CO2 29 10/19/2020 0917   GLUCOSE 154 (H) 10/19/2020 0917   BUN 16 10/19/2020 0917   BUN 11 10/11/2014 1122   CREATININE 1.23 10/19/2020 0917   CREATININE 0.72 03/26/2013 1608   CALCIUM 9.1 10/19/2020 0917   GFRNONAA >60 10/19/2020 0917   GFRAA >60 12/25/2019 0358    Lipid Panel     Component Value Date/Time   CHOL 221 (H) 10/27/2020 1445   TRIG 527 (H) 10/27/2020 1445   HDL 35 (L) 10/27/2020 1445   CHOLHDL 6.3 (H) 10/27/2020 1445   VLDL 75.6 (H) 04/27/2019 1116   LDLCALC  10/27/2020 1445     Comment:      . LDL cholesterol not calculated. Triglyceride levels greater than 400 mg/dL invalidate calculated LDL results. . Reference range: <100 . Desirable range <100 mg/dL for primary prevention;   <70 mg/dL for patients with CHD or diabetic patients  with > or = 2 CHD risk factors. Marland Kitchen LDL-C is now calculated using the Martin-Hopkins  calculation, which is a validated novel method providing  better accuracy than the Friedewald equation in the  estimation of LDL-C.  Cresenciano Genre et al. Annamaria Helling. 9643;838(18): 2061-2068  (http://education.[employer].com/faq/FAQ164)     CBC    Component Value Date/Time   WBC 5.7 10/19/2020 0917   RBC 3.88 (L) 10/19/2020 0917   HGB 13.2 10/19/2020 0917   HCT 38.8 (L) 10/19/2020 0917   PLT 181 10/19/2020 0917   MCV 100.0 10/19/2020 0917   MCH 34.0 10/19/2020 0917   MCHC 34.0 10/19/2020 0917   RDW 11.9 10/19/2020 0917   RDW 13.5 10/11/2014 1122   LYMPHSABS 0.9 12/25/2019 0358   LYMPHSABS 1.3 10/11/2014 1122   MONOABS 0.5 12/25/2019 0358   EOSABS 0.0 12/25/2019 0358   EOSABS 0.0 10/11/2014 1122   BASOSABS 0.0 12/25/2019 0358   BASOSABS 0.0 10/11/2014 1122    Hgb A1C Lab Results  Component Value Date   HGBA1C 7.1 (H) 10/19/2020           Assessment & Plan:   Tailbone Pain:   Xray sacrum/coccyx today Encouraged him to sit on a donut pillow Enocuraged ice for 10 min 2 x day  Return precautions discussed

## 2021-01-17 ENCOUNTER — Other Ambulatory Visit: Payer: Self-pay | Admitting: Internal Medicine

## 2021-01-17 NOTE — Telephone Encounter (Signed)
  Notes to clinic:  Medication filled by historical provider  Review for refill    Requested Prescriptions  Pending Prescriptions Disp Refills   BREO ELLIPTA 100-25 MCG/INH AEPB [Pharmacy Med Name: BREO ELLIPTA 100-25 MCG INH] 60 each 2    Sig: INHALE 1 PUFF BY MOUTH EVERY DAY      There is no refill protocol information for this order

## 2021-01-19 NOTE — Progress Notes (Signed)
Surgical Instructions    Your procedure is scheduled on Thursday, July 7th, 2022.  Report to [facility] Main Entrance "A" at 12:00 A.M., then check in with the Admitting office.  Call this number if you have problems the morning of surgery:  [phone]   If you have any questions prior to your surgery date call [phone]: Open Monday-Friday 8am-4pm    Remember:  Do not eat or drink after midnight the night before your surgery    Take these medicines the morning of surgery with A SIP OF WATER:  carbidopa-levodopa (SINEMET IR) citalopram (CELEXA)  BREO ELLIPTA - bring the inhaler with you cyclobenzaprine (FLEXERIL) divalproex (DEPAKOTE ER)  donepezil (ARICEPT) famotidine (PEPCID) memantine (NAMENDA XR)  If needed:  acetaminophen (TYLENOL)  Albuterol Sulfate (PROAIR RESPICLICK) - bring the inhaler with you HYDROcodone-acetaminophen (NORCO)  nitroGLYCERIN (NITROSTAT)   Follow your surgeon's instructions on when to stop Aspirin.  If no instructions were given by your surgeon then you will need to call the office to get those instructions.     As of today, STOP taking any Aspirin (unless otherwise instructed by your surgeon) Aleve, Naproxen, Ibuprofen, Motrin, Advil, Mobic, Goody's, BC's, all herbal medications, fish oil, and all vitamins.  WHAT DO I DO ABOUT MY DIABETES MEDICATION?   Do not take metFORMIN (GLUCOPHAGE) the morning of surgery.   HOW TO MANAGE YOUR DIABETES BEFORE AND AFTER SURGERY  Why is it important to control my blood sugar before and after surgery? Improving blood sugar levels before and after surgery helps healing and can limit problems. A way of improving blood sugar control is eating a healthy diet by:  Eating less sugar and carbohydrates  Increasing activity/exercise  Talking with your doctor about reaching your blood sugar goals High blood sugars (greater than 180 mg/dL) can raise your risk of infections and slow your recovery, so you will  need to focus on controlling your diabetes during the weeks before surgery. Make sure that the doctor who takes care of your diabetes knows about your planned surgery including the date and location.  How do I manage my blood sugar before surgery? Check your blood sugar at least 4 times a day, starting 2 days before surgery, to make sure that the level is not too high or low.  Check your blood sugar the morning of your surgery when you wake up and every 2 hours until you get to the Short Stay unit.  If your blood sugar is less than 70 mg/dL, you will need to treat for low blood sugar: Do not take insulin. Treat a low blood sugar (less than 70 mg/dL) with  cup of clear juice (cranberry or apple), 4 glucose tablets, OR glucose gel. Recheck blood sugar in 15 minutes after treatment (to make sure it is greater than 70 mg/dL). If your blood sugar is not greater than 70 mg/dL on recheck, call [phone] for further instructions. Report your blood sugar to the short stay nurse when you get to Short Stay.  If you are admitted to the hospital after surgery: Your blood sugar will be checked by the staff and you will probably be given insulin after surgery (instead of oral diabetes medicines) to make sure you have good blood sugar levels. The goal for blood sugar control after surgery is 80-180 mg/dL. to          Do not wear jewelry  Do not wear lotions, powder, colognes, or deodorant. Men may shave face and neck. Do not bring  valuables to the hospital. DO Not wear nail polish, gel polish, artificial nails, or any other type of covering on natural nails including finger and toenails. If patients have artificial nails, gel coating, etc. that need to be removed by a nail salon please have this removed prior to surgery or surgery may need to be canceled/delayed if the surgeon/ anesthesia feels like the patient is unable to be adequately monitored.             [facility] is not responsible for any  belongings or valuables.  Do NOT Smoke (Tobacco/Vaping) or drink Alcohol 24 hours prior to your procedure If you use a CPAP at night, you may bring all equipment for your overnight stay.   Contacts, glasses, dentures or bridgework may not be worn into surgery, please bring cases for these belongings   For patients admitted to the hospital, discharge time will be determined by your treatment team.   Patients discharged the day of surgery will not be allowed to drive home, and someone needs to stay with them for 24 hours.  ONLY 1 SUPPORT PERSON MAY BE PRESENT WHILE YOU ARE IN SURGERY. IF YOU ARE TO BE ADMITTED ONCE YOU ARE IN YOUR ROOM YOU WILL BE ALLOWED TWO (2) VISITORS.  Minor children may have two parents present. Special consideration for safety and communication needs will be reviewed on a case by case basis.  Special instructions:    Oral Hygiene is also important to reduce your risk of infection.  Remember - BRUSH YOUR TEETH THE MORNING OF SURGERY WITH YOUR REGULAR TOOTHPASTE   [facility]- Preparing For Surgery  Before surgery, you can play an important role. Because skin is not sterile, your skin needs to be as free of germs as possible. You can reduce the number of germs on your skin by washing with CHG (chlorahexidine gluconate) Soap before surgery.  CHG is an antiseptic cleaner which kills germs and bonds with the skin to continue killing germs even after washing.     Please do not use if you have an allergy to CHG or antibacterial soaps. If your skin becomes reddened/irritated stop using the CHG.  Do not shave (including legs and underarms) for at least 48 hours prior to first CHG shower. It is OK to shave your face.  Please follow these instructions carefully.     Shower the NIGHT BEFORE SURGERY and the MORNING OF SURGERY with CHG Soap.   If you chose to wash your hair, wash your hair first as usual with your normal shampoo. After you shampoo, rinse your hair and body  thoroughly to remove the shampoo.  Then [employer] and genitals (private parts) with your normal soap and rinse thoroughly to remove soap.  After that Use CHG Soap as you would any other liquid soap. You can apply CHG directly to the skin and wash gently with a scrungie or a clean washcloth.   Apply the CHG Soap to your body ONLY FROM THE NECK DOWN.  Do not use on open wounds or open sores. Avoid contact with your eyes, ears, mouth and genitals (private parts). Wash Face and genitals (private parts)  with your normal soap.   Wash thoroughly, paying special attention to the area where your surgery will be performed.  Thoroughly rinse your body with warm water from the neck down.  DO NOT shower/wash with your normal soap after using and rinsing off the CHG Soap.  Pat yourself dry with a CLEAN TOWEL.  Wear CLEAN PAJAMAS to bed the night before surgery  Place CLEAN SHEETS on your bed the night before your surgery  DO NOT SLEEP WITH PETS.   Day of Surgery:  Take a shower with CHG soap. Wear Clean/Comfortable clothing the morning of surgery Do not apply any deodorants/lotions.   Remember to brush your teeth WITH YOUR REGULAR TOOTHPASTE.   Please read over the following fact sheets that you were given.

## 2021-01-20 NOTE — H&P (Signed)
Patient ID:   [id number] Patient: Alexander Booth  Date of Birth: 17-Mar-1946 Visit Type: Office Visit   Date: 12/25/2020 08:30 AM Provider: Marchia Meiers. Vertell Limber MD   This 75 year old male presents for neck and back pain.  HISTORY OF PRESENT ILLNESS:  1.  neck and back pain  08/22/2020 spinal cord stimulator removal MRI review  Patient returns as scheduled for review of lumbar MRI.  He reports low back pain radiating into both hips and both legs to the ankles.  He reports some relief using a heating pad while supine.  Patient reports the pain as being constant, when sitting or active.  Incisions are well healed.  He reports no relief with medications, physical therapy, or injections.  He feels physical therapy over the last 3 years have made symptoms worse.  He reports 5 yd is his maximum walking distance due to pain. He exhibits full strength BLE on examination.  He continues to follow with Simeon Craft PA-C for pain management Hydrocodone 10/325 taken t.i.d. Offers no relief per patient Flexeril 5 mg taken b.i.d. Offers no relief per patient  Lumbar MRI on canopy         Medical/Surgical/Interim History Reviewed, no change.  Last detailed document date:12/13/2019.     Family History:  Reviewed, no changes.  Last detailed document date:12/13/2019.   Social History: Reviewed, no changes. Last detailed document date: 12/13/2019.    MEDICATIONS: (added, continued or stopped this visit) Started Medication Directions Instruction Stopped   Aricept 10 mg tablet     03/08/2018 Breo Ellipta 100 mcg-25 mcg/dose powder for inhalation     05/03/2019 citalopram 20 mg tablet     11/29/2020 cyclobenzaprine 5 mg tablet take 1 tablet by oral route 2 times every day     divalproex ER 500 mg tablet,extended release 24 hr     01/03/2018 Flovent HFA 110 mcg/actuation aerosol inhaler     01/02/2019 furosemide 20 mg tablet     01/26/2018 gabapentin 400 mg capsule     09/13/2020  hydrocodone 10 mg-acetaminophen 325 mg tablet take 1 tablet by oral route  every 6 hours as needed for pain (DNF 11/07/20)    11/29/2020 hydrocodone 10 mg-acetaminophen 325 mg tablet take 1 tablet by oral route  every 6 hours as needed for pain (DNF 12/29/20) The patient has a script on hold that can be filled today   11/29/2020 hydrocodone 10 mg-acetaminophen 325 mg tablet take 1 tablet by oral route  every 6 hours as needed for pain (DNF 01/27/21)    03/23/2018 lorazepam 0.5 mg tablet      meloxicam 15 mg tablet take 1 tablet by oral route  every day     metformin 500 mg tablet     01/28/2018 mirtazapine 45 mg tablet      Namenda 5 mg tablet take 2 tablet by oral route 2 times every day     simvastatin 40 mg tablet     12/26/2018 Symbicort 80 mcg-4.5 mcg/actuation HFA aerosol inhaler     11/11/2018 tamsulosin 0.4 mg capsule     12/15/2017 Ventolin HFA 90 mcg/actuation aerosol inhaler        ALLERGIES: Ingredient Reaction Medication Name Comment  CODEINE Rash    OXYCODONE Aggressive behavior; Altered mental status     Reviewed, no changes.    PHYSICAL EXAM:   Vitals Date Temp F BP Pulse Ht In Wt Lb BMI BSA Pain Score  12/25/2020  159/71 84 72 182.6 24.76  8/10  12/25/2020    72         DIAGNOSTIC RESULTS:   Lumbar MRI is essentially unchanged, exhibiting disc bulging at each level with osteophytes and ligamentum flavum hypertrophy, foraminal stenosis most significant at L4-5 and L5-S1 levels bilaterally.    IMPRESSION:   Alexander Booth visits with his granddaughter, in good spirits but frustrated with his lumbar radiculopathy.  PLAN:  At present, patient is not interested in pursuing surgery.  However, if he decides to proceed, bilateral L4-5 and L5-S1 laminal foraminotomies would be recommended.  Surgical plan and postoperative expectations are reviewed with patient and his granddaughter today.  They will call if he wishes to proceed, and with any questions.  He will  continue to work with Simeon Craft PA-C for pain management.   Assessment/Plan   # Detail Type Description   1. Assessment Radiculopathy, lumbar region (M54.16).       2. 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       3. Assessment Parkinson disease (G20).                     Provider:  Marchia Meiers. Vertell Limber MD  12/25/2020 09:09 AM    Dictation edited by: Mike Craze. Poteat RN    CC Providers: Webb Silversmith [place],  [place]  [id number]-   Thomas Brackbill  [facility] [address] [place], [place] [address]-               Electronically signed by Marchia Meiers. Vertell Limber MD on 12/25/2020 06:31 PM

## 2021-01-23 ENCOUNTER — Encounter (HOSPITAL_COMMUNITY): Payer: Self-pay

## 2021-01-23 ENCOUNTER — Encounter (HOSPITAL_COMMUNITY)
Admission: RE | Admit: 2021-01-23 | Discharge: 2021-01-23 | Disposition: A | Discharge status: Home / Self Care | Payer: Medicare HMO | Source: Ambulatory Visit | Attending: Neurosurgery | Admitting: Neurosurgery

## 2021-01-23 ENCOUNTER — Other Ambulatory Visit: Payer: Self-pay

## 2021-01-23 DIAGNOSIS — J449 Chronic obstructive pulmonary disease, unspecified: Secondary | ICD-10-CM | POA: Diagnosis not present

## 2021-01-23 DIAGNOSIS — Z01818 Encounter for other preprocedural examination: Secondary | ICD-10-CM | POA: Diagnosis not present

## 2021-01-23 DIAGNOSIS — Z20822 Contact with and (suspected) exposure to covid-19: Secondary | ICD-10-CM | POA: Diagnosis not present

## 2021-01-23 HISTORY — DX: Dyspnea, unspecified: R06.00

## 2021-01-23 HISTORY — DX: Unspecified osteoarthritis, unspecified site: M19.90

## 2021-01-23 LAB — COMPREHENSIVE METABOLIC PANEL
ALT: 5 U/L (ref 0–44)
AST: 14 U/L — ABNORMAL LOW (ref 15–41)
Albumin: 3.5 g/dL (ref 3.5–5.0)
Alkaline Phosphatase: 52 U/L (ref 38–126)
Anion gap: 7 (ref 5–15)
BUN: 13 mg/dL (ref 8–23)
CO2: 28 mmol/L (ref 22–32)
Calcium: 9.2 mg/dL (ref 8.9–10.3)
Chloride: 100 mmol/L (ref 98–111)
Creatinine, Ser: 1.19 mg/dL (ref 0.61–1.24)
GFR, Estimated: >60 mL/min (ref >60)
Glucose, Bld: 122 mg/dL — ABNORMAL HIGH (ref 70–99)
Potassium: 4.3 mmol/L (ref 3.5–5.1)
Sodium: 135 mmol/L (ref 135–145)
Total Bilirubin: 0.6 mg/dL (ref 0.3–1.2)
Total Protein: 6.4 g/dL — ABNORMAL LOW (ref 6.5–8.1)

## 2021-01-23 LAB — TYPE AND SCREEN
ABO/RH(D): A POS
Antibody Screen: NEGATIVE

## 2021-01-23 LAB — CBC
HCT: 38 % — ABNORMAL LOW (ref 39.0–52.0)
Hemoglobin: 12.5 g/dL — ABNORMAL LOW (ref 13.0–17.0)
MCH: 33 pg (ref 26.0–34.0)
MCHC: 32.9 g/dL (ref 30.0–36.0)
MCV: 100.3 fL — ABNORMAL HIGH (ref 80.0–100.0)
Platelets: 168 10*3/uL (ref 150–400)
RBC: 3.79 MIL/uL — ABNORMAL LOW (ref 4.22–5.81)
RDW: 11.9 % (ref 11.5–15.5)
WBC: 6.1 10*3/uL (ref 4.0–10.5)
nRBC: 0 % (ref 0.0–0.2)

## 2021-01-23 LAB — SURGICAL PCR SCREEN
MRSA, PCR: NEGATIVE
Staphylococcus aureus: POSITIVE — AB

## 2021-01-23 LAB — HEMOGLOBIN A1C
Hgb A1c MFr Bld: 7 % — ABNORMAL HIGH (ref 4.8–5.6)
Mean Plasma Glucose: 154 mg/dL

## 2021-01-23 LAB — SARS CORONAVIRUS 2 (TAT 6-24 HRS): SARS Coronavirus 2: NEGATIVE

## 2021-01-23 LAB — GLUCOSE, CAPILLARY: Glucose-Capillary: 133 mg/dL — ABNORMAL HIGH (ref 70–99)

## 2021-01-23 NOTE — Progress Notes (Addendum)
PCP: Webb Silversmith, Md Cardiologist: denies  EKG: 01/23/21 CXR: 01/06/20 ECHO: 11/12/18. Negative per daughter Stress Test: denies Cardiac Cath: denies  Fasting Blood Sugar- does not check glucose Checks Blood Sugar_0__ times a day  OSA/CPAP: No Oxygen: Uses 2L 02 at HS  ASA: Last dose 7/2. Blood Thinners: No  Covid test 01/23/21 at PAT  Anesthesia Review: Yes, oxygen use  Patient denies shortness of breath, fever, cough, and chest pain at PAT appointment.  Patient verbalized understanding of instructions provided today at the PAT appointment.  Patient asked to review instructions at home and day of surgery.

## 2021-01-24 NOTE — Anesthesia Preprocedure Evaluation (Addendum)
Anesthesia Evaluation  Patient identified by MRN, date of birth, ID band Patient awake    Reviewed: Allergy & Precautions, NPO status , Patient's Chart, lab work & pertinent test results  Airway Mallampati: II  TM Distance: >3 FB Neck ROM: Full    Dental no notable dental hx.    Pulmonary shortness of breath, COPD (2L at night),  COPD inhaler and oxygen dependent, Current Smoker,    Pulmonary exam normal breath sounds clear to auscultation       Cardiovascular hypertension, Normal cardiovascular exam Rhythm:Regular Rate:Normal  EKG: 01/23/21: NSR   CV: Echo 11/12/18: IMPRESSIONS  1. The left ventricle has normal systolic function, with an ejection  fraction of 55-60%. The cavity size was normal. Left ventricular diastolic  parameters were normal.  2. The right ventricle has normal systolic function. The cavity was  normal. There is no increase in right ventricular wall thickness.  3. Left atrial size was mildly dilated.  4. The aortic valve is tricuspid. Mild thickening of the aortic valve.  Mild calcification of the aortic valve.   Neuro/Psych PSYCHIATRIC DISORDERS Depression Dementia Parkinson's negative neurological ROS     GI/Hepatic Neg liver ROS, GERD  ,  Endo/Other  diabetes, Type 2, Oral Hypoglycemic Agents  Renal/GU negative Renal ROS  negative genitourinary   Musculoskeletal  (+) Arthritis ,   Abdominal   Peds  Hematology negative hematology ROS (+)   Anesthesia Other Findings  smoking, COPD (nocturnal O2 @ 2L), dyspnea, HTN, DM2 (with peripheral neuropathy), hypercholesterolemia, hard of hearing, neck surgery (C5-6 ACDF 1995; C3-5 ACDF 04/25/11), back surgery (L1 kyphoplasty 12/10/12; L1-2 PLIF 01/18/13; thoracic spinal cord stimulator insertion with laminectomy 12/02/14; removal of spinal cord stimulator with paddle lead 10/19/20).   Reproductive/Obstetrics                            Anesthesia Physical Anesthesia Plan  ASA: 3  Anesthesia Plan: General   Post-op Pain Management:    Induction: Intravenous  PONV Risk Score and Plan: 1 and Dexamethasone, Ondansetron and Treatment may vary due to age or medical condition  Airway Management Planned: Oral ETT  Additional Equipment:   Intra-op Plan:   Post-operative Plan: Extubation in OR  Informed Consent: I have reviewed the patients History and Physical, chart, labs and discussed the procedure including the risks, benefits and alternatives for the proposed anesthesia with the patient or authorized representative who has indicated his/her understanding and acceptance.     Dental advisory given  Plan Discussed with: CRNA  Anesthesia Plan Comments: ( )       Anesthesia Quick Evaluation

## 2021-01-24 NOTE — Progress Notes (Signed)
Anesthesia Chart Review:  Case: [id number] Date/Time: 01/25/21 1603   Procedure: Bilateral Lumbar 4-5, Lumbar 5 Sacral 1 Laminectomy/Foraminotomy (Bilateral) - 3C/RM 21   Anesthesia type: General   Pre-op diagnosis: Radiculopathy, Lumbar region   Location: [facility] OR ROOM 21 / [facility]   Surgeons: Erline Levine, MD       DISCUSSION: Patient is a 75 year old male scheduled for the above procedure.  History includes smoking, COPD (nocturnal O2 @ 2L), dyspnea, HTN, DM2 (with peripheral neuropathy), hypercholesterolemia, hard of hearing, neck surgery (C5-6 ACDF 1995; C3-5 ACDF 04/25/11), back surgery (L1 kyphoplasty 12/10/12; L1-2 PLIF 01/18/13; thoracic spinal cord stimulator insertion with laminectomy 12/02/14; removal of spinal cord stimulator with paddle lead 10/19/20). He has had progressive memory loss since 2004 with some concern for Alzheimer's disease, but per 01/31/20 neurology note by Dr. Carles Collet, she was not convinced his memory loss was from Alzheimer's or neurodegenerative disease as "he really should look very differently" 18 years later. She was exhibiting parkinsonism symptoms and started him on Sinemet for Parkinson's Disease. He was also referred for neurocognitive testing.   Last aspirin 01/20/2021. Denied chest pain and SOB per PAT RN interview.   01/23/2021 presurgical COVID-19 test negative. Anesthesia team to evaluate on the day of surgery.    VS: BP (!) 117/59   Pulse 69   Temp 36.5 C (Oral)   Resp 17   Ht 6' (1.829 m)   Wt 83.1 kg   SpO2 97%   BMI 24.86 kg/m    PROVIDERS: [name], NP is PCP  Tat, Wells Guiles, DO is neurologist - He is not routinely followed by cardiology. He has been followed by Darlin Coco, MD from at least 2012-2017. Appears he was mainly following him for medical issues. At the last visit there on 08/18/15, he wrote, "Following my retirement he will not need to be followed by cardiology.  Continue follow-up for his medical problems with his PCP."   LABS:  Labs reviewed: Acceptable for surgery. (all labs ordered are listed, but only abnormal results are displayed)  Labs Reviewed  SURGICAL PCR SCREEN - Abnormal; Notable for the following components:      Result Value   Staphylococcus aureus POSITIVE (*)    All other components within normal limits  GLUCOSE, CAPILLARY - Abnormal; Notable for the following components:   Glucose-Capillary 133 (*)    All other components within normal limits  HEMOGLOBIN A1C - Abnormal; Notable for the following components:   Hgb A1c MFr Bld 7.0 (*)    All other components within normal limits  COMPREHENSIVE METABOLIC PANEL - Abnormal; Notable for the following components:   Glucose, Bld 122 (*)    Total Protein 6.4 (*)    AST 14 (*)    All other components within normal limits  CBC - Abnormal; Notable for the following components:   RBC 3.79 (*)    Hemoglobin 12.5 (*)    HCT 38.0 (*)    MCV 100.3 (*)    All other components within normal limits  SARS CORONAVIRUS 2 (TAT 6-24 HRS)  TYPE AND SCREEN     IMAGES: MRI L-spine 11/28/20 (Full report and images in Canopy/PACS): IMPRESSION: Unchanged diffuse lumbar disc and facet degeneration with up to moderate lateral recess and neural foraminal stenosis as above.  CT C-spine 06/29/20: IMPRESSION: - No acute fracture or listhesis of the cervical spine. - Solid fusion C3-C6. - Degenerative disc and degenerative joint disease as described above. Multilevel neural foraminal narrowing,  most severe on the left at C5-6. These changes appear stable since prior examination. - Moderate bilateral carotid bifurcation calcifications.   EKG: 01/23/21: NSR   CV: Echo 11/12/18: IMPRESSIONS   1. The left ventricle has normal systolic function, with an ejection  fraction of 55-60%. The cavity size was normal. Left ventricular diastolic  parameters were normal.   2. The right ventricle has normal systolic function. The cavity was  normal. There is no increase in  right ventricular wall thickness.   3. Left atrial size was mildly dilated.   4. The aortic valve is tricuspid. Mild thickening of the aortic valve.  Mild calcification of the aortic valve.    Past Medical History:  Diagnosis Date   Arthritis    COPD (chronic obstructive pulmonary disease) (HCC)    Depression    Dyspnea    Erectile dysfunction    He's had a hx of    Hypercholesterolemia    Hypertension    Essential   Lumbosacral disc disease    with chronic low back pain, has implanted nerve stimulator   Peripheral neuropathy    Presenile dementia    Type 2 diabetes mellitus ([place]) 2010   takes oral meds    Past Surgical History:  Procedure Laterality Date   ANTERIOR CERVICAL DECOMP/DISCECTOMY FUSION     APPENDECTOMY     HEMORRHOID SURGERY     x2   JOINT REPLACEMENT Left    2nd one was to change siZe   KNEE SURGERY     Previous left total replacement   KYPHOPLASTY N/A 12/10/2012   Procedure: Lumbar one Kyphoplasty;  Surgeon: Kristeen Miss, MD;  Location: [facility];  Service: Neurosurgery;  Laterality: N/A;  Lumbar One Kyphoplasty   OTHER SURGICAL HISTORY  1992   Bone Fusion in Right Foot   SPINAL CORD STIMULATOR INSERTION N/A 12/02/2014   Procedure: THORACIC SPINAL CORD STIMULATOR INSERTION INFINION BOSTON SCIENTIFIC 16 LEAD WITH LAMINECTOMY;  Surgeon: Erline Levine, MD;  Location: [facility];  Service: Neurosurgery;  Laterality: N/A;  THORACIC SPINAL CORD STIMULATOR INSERTION INFINION BOSTON SCIENTIFIC 16 LEAD WITH LAMINECTOMY   SPINAL CORD STIMULATOR REMOVAL N/A 10/19/2020   Procedure: Removal of spinal cord stimulator with paddle lead;  Surgeon: Erline Levine, MD;  Location: [facility];  Service: Neurosurgery;  Laterality: N/A;   TOTAL KNEE ARTHROPLASTY Left     MEDICATIONS:  Accu-Chek Softclix Lancets lancets   acetaminophen (TYLENOL) 500 MG tablet   Albuterol Sulfate (PROAIR RESPICLICK) 390 (90 Base) MCG/ACT AEPB   aspirin EC 81 MG tablet   Blood Glucose Monitoring  Suppl (ACCU-CHEK AVIVA PLUS) w/Device KIT   BREO ELLIPTA 100-25 MCG/INH AEPB   carbidopa-levodopa (SINEMET IR) 25-100 MG tablet   Cholecalciferol (QC VITAMIN D3) 50 MCG (2000 UT) TABS   citalopram (CELEXA) 20 MG tablet   cyclobenzaprine (FLEXERIL) 5 MG tablet   divalproex (DEPAKOTE ER) 500 MG 24 hr tablet   donepezil (ARICEPT) 10 MG tablet   famotidine (PEPCID) 10 MG tablet   furosemide (LASIX) 20 MG tablet   gabapentin (NEURONTIN) 400 MG capsule   glucose blood (ACCU-CHEK AVIVA PLUS) test strip   HYDROcodone-acetaminophen (NORCO) 10-325 MG tablet   LORazepam (ATIVAN) 0.5 MG tablet   meloxicam (MOBIC) 15 MG tablet   memantine (NAMENDA XR) 7 MG CP24 24 hr capsule   metFORMIN (GLUCOPHAGE) 500 MG tablet   mirtazapine (REMERON) 45 MG tablet   mupirocin ointment (BACTROBAN) 2 %   nitroGLYCERIN (NITROSTAT) 0.4 MG SL tablet  simvastatin (ZOCOR) 40 MG tablet   tamsulosin (FLOMAX) 0.4 MG CAPS capsule   vitamin C (ASCORBIC ACID) 500 MG tablet   No current facility-administered medications for this encounter.    Myra Gianotti, PA-C Surgical Short Stay/Anesthesiology [facility] Phone [phone] [facility] Phone [phone] 01/24/2021 10:14 AM

## 2021-01-25 ENCOUNTER — Observation Stay (HOSPITAL_COMMUNITY)
Admission: RE | Admit: 2021-01-25 | Discharge: 2021-01-26 | Disposition: A | Discharge status: Home / Self Care | Payer: Medicare HMO | Attending: Neurosurgery | Admitting: Neurosurgery

## 2021-01-25 ENCOUNTER — Ambulatory Visit (HOSPITAL_COMMUNITY): Payer: Medicare HMO | Admitting: Vascular Surgery

## 2021-01-25 ENCOUNTER — Encounter (HOSPITAL_COMMUNITY): Payer: Self-pay | Admitting: Neurosurgery

## 2021-01-25 ENCOUNTER — Encounter (HOSPITAL_COMMUNITY)
Admission: RE | Disposition: A | Discharge status: Home / Self Care | Payer: Self-pay | Source: Home / Self Care | Attending: Neurosurgery

## 2021-01-25 ENCOUNTER — Ambulatory Visit (HOSPITAL_COMMUNITY): Payer: Medicare HMO

## 2021-01-25 ENCOUNTER — Ambulatory Visit (HOSPITAL_COMMUNITY): Payer: Medicare HMO | Admitting: Anesthesiology

## 2021-01-25 ENCOUNTER — Other Ambulatory Visit: Payer: Self-pay

## 2021-01-25 DIAGNOSIS — M4807 Spinal stenosis, lumbosacral region: Principal | ICD-10-CM | POA: Insufficient documentation

## 2021-01-25 DIAGNOSIS — Z419 Encounter for procedure for purposes other than remedying health state, unspecified: Secondary | ICD-10-CM

## 2021-01-25 DIAGNOSIS — R2689 Other abnormalities of gait and mobility: Secondary | ICD-10-CM | POA: Insufficient documentation

## 2021-01-25 DIAGNOSIS — M4726 Other spondylosis with radiculopathy, lumbar region: Secondary | ICD-10-CM | POA: Insufficient documentation

## 2021-01-25 DIAGNOSIS — G20 Parkinson's disease: Secondary | ICD-10-CM | POA: Insufficient documentation

## 2021-01-25 DIAGNOSIS — M47816 Spondylosis without myelopathy or radiculopathy, lumbar region: Secondary | ICD-10-CM | POA: Diagnosis not present

## 2021-01-25 DIAGNOSIS — Z79899 Other long term (current) drug therapy: Secondary | ICD-10-CM | POA: Diagnosis not present

## 2021-01-25 DIAGNOSIS — M48061 Spinal stenosis, lumbar region without neurogenic claudication: Secondary | ICD-10-CM | POA: Diagnosis present

## 2021-01-25 DIAGNOSIS — J449 Chronic obstructive pulmonary disease, unspecified: Secondary | ICD-10-CM | POA: Diagnosis not present

## 2021-01-25 DIAGNOSIS — Z981 Arthrodesis status: Secondary | ICD-10-CM | POA: Diagnosis not present

## 2021-01-25 DIAGNOSIS — Z885 Allergy status to narcotic agent status: Secondary | ICD-10-CM | POA: Insufficient documentation

## 2021-01-25 DIAGNOSIS — M5417 Radiculopathy, lumbosacral region: Secondary | ICD-10-CM | POA: Diagnosis not present

## 2021-01-25 DIAGNOSIS — M5416 Radiculopathy, lumbar region: Secondary | ICD-10-CM | POA: Diagnosis not present

## 2021-01-25 DIAGNOSIS — I10 Essential (primary) hypertension: Secondary | ICD-10-CM | POA: Diagnosis not present

## 2021-01-25 HISTORY — PX: LUMBAR LAMINECTOMY/DECOMPRESSION MICRODISCECTOMY: SHX5026

## 2021-01-25 LAB — GLUCOSE, CAPILLARY
Glucose-Capillary: 148 mg/dL — ABNORMAL HIGH (ref 70–99)
Glucose-Capillary: 124 mg/dL — ABNORMAL HIGH (ref 70–99)
Glucose-Capillary: 146 mg/dL — ABNORMAL HIGH (ref 70–99)
Glucose-Capillary: 290 mg/dL — ABNORMAL HIGH (ref 70–99)

## 2021-01-25 SURGERY — LUMBAR LAMINECTOMY/DECOMPRESSION MICRODISCECTOMY 2 LEVELS
Anesthesia: General | Site: Back | Laterality: Bilateral

## 2021-01-25 MED ORDER — KCL IN DEXTROSE-NACL 20-5-0.45 MEQ/L-%-% IV SOLN: INTRAVENOUS | Status: DC

## 2021-01-25 MED ORDER — ALBUTEROL SULFATE (2.5 MG/3ML) 0.083% IN NEBU: 2.5000 mg | INHALATION_SOLUTION | RESPIRATORY_TRACT | Status: DC | PRN

## 2021-01-25 MED ORDER — FENTANYL CITRATE (PF) 250 MCG/5ML IJ SOLN
INTRAMUSCULAR | Status: AC
  Filled 2021-01-25: qty 5

## 2021-01-25 MED ORDER — FENTANYL CITRATE (PF) 100 MCG/2ML IJ SOLN
INTRAMUSCULAR | Status: AC
  Filled 2021-01-25: qty 2

## 2021-01-25 MED ORDER — BUPIVACAINE HCL (PF) 0.5 % IJ SOLN
INTRAMUSCULAR | Status: AC
  Filled 2021-01-25: qty 30

## 2021-01-25 MED ORDER — CHLORHEXIDINE GLUCONATE 0.12 % MT SOLN
OROMUCOSAL | Status: AC
  Filled 2021-01-25: qty 15

## 2021-01-25 MED ORDER — POLYETHYLENE GLYCOL 3350 17 G PO PACK: 17.0000 g | PACK | Freq: Every day | ORAL | Status: DC | PRN

## 2021-01-25 MED ORDER — FENTANYL CITRATE (PF) 100 MCG/2ML IJ SOLN
25.0000 ug | INTRAMUSCULAR | Status: DC | PRN
  Administered 2021-01-25 (×2): 25 ug via INTRAVENOUS

## 2021-01-25 MED ORDER — DEXAMETHASONE SODIUM PHOSPHATE 10 MG/ML IJ SOLN
INTRAMUSCULAR | Status: DC | PRN
  Administered 2021-01-25: 10 mg via INTRAVENOUS

## 2021-01-25 MED ORDER — DIVALPROEX SODIUM ER 500 MG PO TB24
500.0000 mg | ORAL_TABLET | Freq: Two times a day (BID) | ORAL | Status: DC
  Administered 2021-01-25: 500 mg via ORAL
  Filled 2021-01-25 (×3): qty 1

## 2021-01-25 MED ORDER — CHLORHEXIDINE GLUCONATE CLOTH 2 % EX PADS: 6.0000 | MEDICATED_PAD | Freq: Once | CUTANEOUS | Status: DC

## 2021-01-25 MED ORDER — PANTOPRAZOLE SODIUM 40 MG IV SOLR: 40.0000 mg | Freq: Every day | INTRAVENOUS | Status: DC

## 2021-01-25 MED ORDER — ONDANSETRON HCL 4 MG/2ML IJ SOLN
INTRAMUSCULAR | Status: DC | PRN
  Administered 2021-01-25: 4 mg via INTRAVENOUS

## 2021-01-25 MED ORDER — SUGAMMADEX SODIUM 200 MG/2ML IV SOLN
INTRAVENOUS | Status: DC | PRN
  Administered 2021-01-25: 200 mg via INTRAVENOUS

## 2021-01-25 MED ORDER — HYDROCODONE-ACETAMINOPHEN 5-325 MG PO TABS
1.0000 | ORAL_TABLET | ORAL | Status: DC | PRN
  Administered 2021-01-26 (×2): 1 via ORAL
  Filled 2021-01-25 (×2): qty 1

## 2021-01-25 MED ORDER — FLEET ENEMA 7-19 GM/118ML RE ENEM: 1.0000 | ENEMA | Freq: Once | RECTAL | Status: DC | PRN

## 2021-01-25 MED ORDER — HYDROCODONE-ACETAMINOPHEN 10-325 MG PO TABS
ORAL_TABLET | ORAL | Status: AC
  Filled 2021-01-25: qty 1

## 2021-01-25 MED ORDER — MIRTAZAPINE 30 MG PO TABS
45.0000 mg | ORAL_TABLET | Freq: Every day | ORAL | Status: DC
  Administered 2021-01-25: 45 mg via ORAL
  Filled 2021-01-25 (×2): qty 1

## 2021-01-25 MED ORDER — BISACODYL 10 MG RE SUPP: 10.0000 mg | Freq: Every day | RECTAL | Status: DC | PRN

## 2021-01-25 MED ORDER — LORAZEPAM 0.5 MG PO TABS
0.5000 mg | ORAL_TABLET | Freq: Every day | ORAL | Status: DC
  Administered 2021-01-25: 0.5 mg via ORAL
  Filled 2021-01-25: qty 1

## 2021-01-25 MED ORDER — FENTANYL CITRATE (PF) 100 MCG/2ML IJ SOLN
INTRAMUSCULAR | Status: DC | PRN
  Administered 2021-01-25: 100 ug via INTRAVENOUS

## 2021-01-25 MED ORDER — KETOROLAC TROMETHAMINE 15 MG/ML IJ SOLN
7.5000 mg | Freq: Four times a day (QID) | INTRAMUSCULAR | Status: DC
  Administered 2021-01-25 – 2021-01-26 (×2): 7.5 mg via INTRAVENOUS
  Filled 2021-01-25 (×2): qty 1

## 2021-01-25 MED ORDER — SIMVASTATIN 20 MG PO TABS
40.0000 mg | ORAL_TABLET | Freq: Every day | ORAL | Status: DC
  Administered 2021-01-25: 40 mg via ORAL
  Filled 2021-01-25: qty 2

## 2021-01-25 MED ORDER — ASPIRIN EC 81 MG PO TBEC
81.0000 mg | DELAYED_RELEASE_TABLET | Freq: Every day | ORAL | Status: DC
  Administered 2021-01-26: 81 mg via ORAL
  Filled 2021-01-25: qty 1

## 2021-01-25 MED ORDER — DOCUSATE SODIUM 100 MG PO CAPS
100.0000 mg | ORAL_CAPSULE | Freq: Two times a day (BID) | ORAL | Status: DC
  Administered 2021-01-25: 100 mg via ORAL
  Filled 2021-01-25: qty 1

## 2021-01-25 MED ORDER — FLUTICASONE FUROATE-VILANTEROL 100-25 MCG/INH IN AEPB
1.0000 | INHALATION_SPRAY | Freq: Every day | RESPIRATORY_TRACT | Status: DC
  Administered 2021-01-26: 1 via RESPIRATORY_TRACT
  Filled 2021-01-25: qty 28

## 2021-01-25 MED ORDER — CHLORHEXIDINE GLUCONATE 0.12 % MT SOLN
15.0000 mL | Freq: Once | OROMUCOSAL | Status: AC
  Administered 2021-01-25: 15 mL via OROMUCOSAL
  Filled 2021-01-25: qty 15

## 2021-01-25 MED ORDER — BUPIVACAINE HCL (PF) 0.5 % IJ SOLN
INTRAMUSCULAR | Status: DC | PRN
  Administered 2021-01-25: 10 mL

## 2021-01-25 MED ORDER — GABAPENTIN 400 MG PO CAPS
800.0000 mg | ORAL_CAPSULE | Freq: Every day | ORAL | Status: DC
  Administered 2021-01-25: 800 mg via ORAL
  Filled 2021-01-25: qty 2

## 2021-01-25 MED ORDER — PROPOFOL 10 MG/ML IV BOLUS
INTRAVENOUS | Status: DC | PRN
  Administered 2021-01-25: 100 mg via INTRAVENOUS
  Administered 2021-01-25: 10 mg via INTRAVENOUS

## 2021-01-25 MED ORDER — CYCLOBENZAPRINE HCL 10 MG PO TABS
ORAL_TABLET | ORAL | Status: AC
  Filled 2021-01-25: qty 1

## 2021-01-25 MED ORDER — FUROSEMIDE 20 MG PO TABS
20.0000 mg | ORAL_TABLET | Freq: Two times a day (BID) | ORAL | Status: DC
  Administered 2021-01-26: 20 mg via ORAL
  Filled 2021-01-25: qty 1

## 2021-01-25 MED ORDER — INSULIN ASPART 100 UNIT/ML IJ SOLN
0.0000 [IU] | Freq: Three times a day (TID) | INTRAMUSCULAR | Status: DC
  Administered 2021-01-26: 5 [IU] via SUBCUTANEOUS

## 2021-01-25 MED ORDER — INSULIN ASPART 100 UNIT/ML IJ SOLN
0.0000 [IU] | Freq: Every day | INTRAMUSCULAR | Status: DC
  Administered 2021-01-25: 3 [IU] via SUBCUTANEOUS

## 2021-01-25 MED ORDER — ORAL CARE MOUTH RINSE: 15.0000 mL | Freq: Once | OROMUCOSAL | Status: AC

## 2021-01-25 MED ORDER — CARBIDOPA-LEVODOPA 25-100 MG PO TABS
1.0000 | ORAL_TABLET | Freq: Two times a day (BID) | ORAL | Status: DC
  Administered 2021-01-25 – 2021-01-26 (×2): 1 via ORAL
  Filled 2021-01-25 (×3): qty 1

## 2021-01-25 MED ORDER — METHYLPREDNISOLONE ACETATE 80 MG/ML IJ SUSP
INTRAMUSCULAR | Status: DC | PRN
  Administered 2021-01-25: 80 mg

## 2021-01-25 MED ORDER — CITALOPRAM HYDROBROMIDE 20 MG PO TABS: 20.0000 mg | ORAL_TABLET | Freq: Every day | ORAL | Status: DC

## 2021-01-25 MED ORDER — FENTANYL CITRATE (PF) 250 MCG/5ML IJ SOLN
INTRAMUSCULAR | Status: DC | PRN
  Administered 2021-01-25: 50 ug via INTRAVENOUS
  Administered 2021-01-25: 25 ug via INTRAVENOUS
  Administered 2021-01-25: 50 ug via INTRAVENOUS

## 2021-01-25 MED ORDER — HYDROCODONE-ACETAMINOPHEN 10-325 MG PO TABS
1.0000 | ORAL_TABLET | Freq: Four times a day (QID) | ORAL | Status: DC | PRN
  Administered 2021-01-25: 1 via ORAL

## 2021-01-25 MED ORDER — VITAMIN D 25 MCG (1000 UNIT) PO TABS: 2000.0000 [IU] | ORAL_TABLET | Freq: Every day | ORAL | Status: DC

## 2021-01-25 MED ORDER — PANTOPRAZOLE SODIUM 40 MG PO TBEC
40.0000 mg | DELAYED_RELEASE_TABLET | Freq: Every day | ORAL | Status: DC
  Administered 2021-01-25: 40 mg via ORAL
  Filled 2021-01-25: qty 1

## 2021-01-25 MED ORDER — PROPOFOL 10 MG/ML IV BOLUS
INTRAVENOUS | Status: AC
  Filled 2021-01-25: qty 20

## 2021-01-25 MED ORDER — PHENYLEPHRINE HCL-NACL 10-0.9 MG/250ML-% IV SOLN
INTRAVENOUS | Status: DC | PRN
  Administered 2021-01-25: 20 ug/min via INTRAVENOUS

## 2021-01-25 MED ORDER — METHYLPREDNISOLONE ACETATE 80 MG/ML IJ SUSP
INTRAMUSCULAR | Status: AC
  Filled 2021-01-25: qty 1

## 2021-01-25 MED ORDER — CEFAZOLIN SODIUM-DEXTROSE 2-4 GM/100ML-% IV SOLN
2.0000 g | Freq: Three times a day (TID) | INTRAVENOUS | Status: AC
  Administered 2021-01-25 – 2021-01-26 (×2): 2 g via INTRAVENOUS
  Filled 2021-01-25 (×2): qty 100

## 2021-01-25 MED ORDER — METFORMIN HCL 500 MG PO TABS
500.0000 mg | ORAL_TABLET | Freq: Two times a day (BID) | ORAL | Status: DC
  Administered 2021-01-26: 500 mg via ORAL
  Filled 2021-01-25: qty 1

## 2021-01-25 MED ORDER — ROCURONIUM BROMIDE 10 MG/ML (PF) SYRINGE
PREFILLED_SYRINGE | INTRAVENOUS | Status: DC | PRN
  Administered 2021-01-25: 10 mg via INTRAVENOUS
  Administered 2021-01-25: 20 mg via INTRAVENOUS
  Administered 2021-01-25: 60 mg via INTRAVENOUS

## 2021-01-25 MED ORDER — ALBUTEROL SULFATE 108 (90 BASE) MCG/ACT IN AEPB: 1.0000 | INHALATION_SPRAY | RESPIRATORY_TRACT | Status: DC | PRN

## 2021-01-25 MED ORDER — ALBUTEROL SULFATE HFA 108 (90 BASE) MCG/ACT IN AERS
INHALATION_SPRAY | RESPIRATORY_TRACT | Status: DC | PRN
  Administered 2021-01-25: 3 via RESPIRATORY_TRACT

## 2021-01-25 MED ORDER — HYDROMORPHONE HCL 1 MG/ML IJ SOLN: 0.5000 mg | INTRAMUSCULAR | Status: DC | PRN

## 2021-01-25 MED ORDER — LACTATED RINGERS IV SOLN: INTRAVENOUS | Status: DC

## 2021-01-25 MED ORDER — PHENOL 1.4 % MT LIQD: 1.0000 | OROMUCOSAL | Status: DC | PRN

## 2021-01-25 MED ORDER — ALUM & MAG HYDROXIDE-SIMETH 200-200-20 MG/5ML PO SUSP: 30.0000 mL | Freq: Four times a day (QID) | ORAL | Status: DC | PRN

## 2021-01-25 MED ORDER — 0.9 % SODIUM CHLORIDE (POUR BTL) OPTIME
TOPICAL | Status: DC | PRN
  Administered 2021-01-25: 1000 mL

## 2021-01-25 MED ORDER — MEMANTINE HCL ER 7 MG PO CP24
7.0000 mg | ORAL_CAPSULE | Freq: Every morning | ORAL | Status: DC
  Filled 2021-01-25: qty 1

## 2021-01-25 MED ORDER — CEFAZOLIN SODIUM-DEXTROSE 2-4 GM/100ML-% IV SOLN
2.0000 g | INTRAVENOUS | Status: AC
  Administered 2021-01-25: 2 g via INTRAVENOUS
  Filled 2021-01-25: qty 100

## 2021-01-25 MED ORDER — FAMOTIDINE 20 MG PO TABS
20.0000 mg | ORAL_TABLET | Freq: Every day | ORAL | Status: DC
  Administered 2021-01-25: 20 mg via ORAL
  Filled 2021-01-25: qty 1

## 2021-01-25 MED ORDER — THROMBIN 5000 UNITS EX SOLR
CUTANEOUS | Status: AC
  Filled 2021-01-25: qty 5000

## 2021-01-25 MED ORDER — ZOLPIDEM TARTRATE 5 MG PO TABS: 5.0000 mg | ORAL_TABLET | Freq: Every evening | ORAL | Status: DC | PRN

## 2021-01-25 MED ORDER — MUPIROCIN 2 % EX OINT: 1.0000 "application " | TOPICAL_OINTMENT | Freq: Two times a day (BID) | CUTANEOUS | Status: DC | PRN

## 2021-01-25 MED ORDER — ONDANSETRON HCL 4 MG/2ML IJ SOLN: 4.0000 mg | Freq: Four times a day (QID) | INTRAMUSCULAR | Status: DC | PRN

## 2021-01-25 MED ORDER — SODIUM CHLORIDE 0.9% FLUSH: 3.0000 mL | INTRAVENOUS | Status: DC | PRN

## 2021-01-25 MED ORDER — ACETAMINOPHEN 500 MG PO TABS: 1000.0000 mg | ORAL_TABLET | Freq: Four times a day (QID) | ORAL | Status: DC | PRN

## 2021-01-25 MED ORDER — ACETAMINOPHEN 650 MG RE SUPP: 650.0000 mg | RECTAL | Status: DC | PRN

## 2021-01-25 MED ORDER — ONDANSETRON HCL 4 MG PO TABS: 4.0000 mg | ORAL_TABLET | Freq: Four times a day (QID) | ORAL | Status: DC | PRN

## 2021-01-25 MED ORDER — LIDOCAINE 2% (20 MG/ML) 5 ML SYRINGE
INTRAMUSCULAR | Status: DC | PRN
  Administered 2021-01-25: 40 mg via INTRAVENOUS
  Administered 2021-01-25: 20 mg via INTRAVENOUS

## 2021-01-25 MED ORDER — MENTHOL 3 MG MT LOZG: 1.0000 | LOZENGE | OROMUCOSAL | Status: DC | PRN

## 2021-01-25 MED ORDER — ASCORBIC ACID 500 MG PO TABS: 500.0000 mg | ORAL_TABLET | Freq: Every day | ORAL | Status: DC

## 2021-01-25 MED ORDER — CYCLOBENZAPRINE HCL 5 MG PO TABS
5.0000 mg | ORAL_TABLET | Freq: Two times a day (BID) | ORAL | Status: DC
  Administered 2021-01-25: 5 mg via ORAL

## 2021-01-25 MED ORDER — MELOXICAM 7.5 MG PO TABS
15.0000 mg | ORAL_TABLET | Freq: Every evening | ORAL | Status: DC
  Filled 2021-01-25: qty 2

## 2021-01-25 MED ORDER — LIDOCAINE-EPINEPHRINE 1 %-1:100000 IJ SOLN
INTRAMUSCULAR | Status: DC | PRN
  Administered 2021-01-25: 10 mL

## 2021-01-25 MED ORDER — EPHEDRINE SULFATE-NACL 50-0.9 MG/10ML-% IV SOSY
PREFILLED_SYRINGE | INTRAVENOUS | Status: DC | PRN
  Administered 2021-01-25: 5 mg via INTRAVENOUS

## 2021-01-25 MED ORDER — TAMSULOSIN HCL 0.4 MG PO CAPS
0.4000 mg | ORAL_CAPSULE | Freq: Every day | ORAL | Status: DC
  Administered 2021-01-25: 0.4 mg via ORAL
  Filled 2021-01-25: qty 1

## 2021-01-25 MED ORDER — ACETAMINOPHEN 325 MG PO TABS: 650.0000 mg | ORAL_TABLET | ORAL | Status: DC | PRN

## 2021-01-25 MED ORDER — THROMBIN 5000 UNITS EX SOLR: OROMUCOSAL | Status: DC | PRN

## 2021-01-25 MED ORDER — DONEPEZIL HCL 10 MG PO TABS
10.0000 mg | ORAL_TABLET | Freq: Every day | ORAL | Status: DC
  Filled 2021-01-25: qty 1

## 2021-01-25 MED ORDER — ACETAMINOPHEN 500 MG PO TABS
1000.0000 mg | ORAL_TABLET | Freq: Once | ORAL | Status: AC
  Administered 2021-01-25: 1000 mg via ORAL
  Filled 2021-01-25: qty 2

## 2021-01-25 MED ORDER — SODIUM CHLORIDE 0.9 % IV SOLN: 250.0000 mL | INTRAVENOUS | Status: DC

## 2021-01-25 MED ORDER — NITROGLYCERIN 0.4 MG SL SUBL: 0.4000 mg | SUBLINGUAL_TABLET | SUBLINGUAL | Status: DC | PRN

## 2021-01-25 MED ORDER — LIDOCAINE-EPINEPHRINE 1 %-1:100000 IJ SOLN
INTRAMUSCULAR | Status: AC
  Filled 2021-01-25: qty 1

## 2021-01-25 MED ORDER — SODIUM CHLORIDE 0.9% FLUSH: 3.0000 mL | Freq: Two times a day (BID) | INTRAVENOUS | Status: DC

## 2021-01-25 SURGICAL SUPPLY — 56 items
ADH SKN CLS APL DERMABOND .7 (GAUZE/BANDAGES/DRESSINGS) ×1
BAG COUNTER SPONGE SURGICOUNT (BAG) ×2 IMPLANT
BAG SPNG CNTER NS LX DISP (BAG) ×1
BAND INSRT 18 STRL LF DISP RB (MISCELLANEOUS) ×2
BAND RUBBER #18 3X1/16 STRL (MISCELLANEOUS) ×4 IMPLANT
BLADE CLIPPER SURG (BLADE) IMPLANT
BUR MATCHSTICK NEURO 3.0 LAGG (BURR) ×2 IMPLANT
BUR ROUND FLUTED 5 RND (BURR) ×2 IMPLANT
CANISTER SUCT 3000ML PPV (MISCELLANEOUS) ×2 IMPLANT
CARTRIDGE OIL MAESTRO DRILL (MISCELLANEOUS) ×1 IMPLANT
DECANTER SPIKE VIAL GLASS SM (MISCELLANEOUS) ×2 IMPLANT
DERMABOND ADVANCED (GAUZE/BANDAGES/DRESSINGS) ×1
DERMABOND ADVANCED .7 DNX12 (GAUZE/BANDAGES/DRESSINGS) ×1 IMPLANT
DIFFUSER DRILL AIR PNEUMATIC (MISCELLANEOUS) ×2 IMPLANT
DRAPE LAPAROTOMY 100X72X124 (DRAPES) ×2 IMPLANT
DRAPE MICROSCOPE LEICA (MISCELLANEOUS) ×2 IMPLANT
DRAPE SURG 17X23 STRL (DRAPES) ×2 IMPLANT
DURAPREP 26ML APPLICATOR (WOUND CARE) ×2 IMPLANT
ELECT REM PT RETURN 9FT ADLT (ELECTROSURGICAL) ×2
ELECTRODE REM PT RTRN 9FT ADLT (ELECTROSURGICAL) ×1 IMPLANT
GAUZE 4X4 16PLY ~~LOC~~+RFID DBL (SPONGE) IMPLANT
GAUZE SPONGE 4X4 12PLY STRL (GAUZE/BANDAGES/DRESSINGS) IMPLANT
GLOVE EXAM NITRILE XL STR (GLOVE) IMPLANT
GLOVE SRG 8 PF TXTR STRL LF DI (GLOVE) ×1 IMPLANT
GLOVE SURG ENC MOIS LTX SZ8 (GLOVE) ×2 IMPLANT
GLOVE SURG LTX SZ8 (GLOVE) ×2 IMPLANT
GLOVE SURG UNDER POLY LF SZ8 (GLOVE) ×2
GLOVE SURG UNDER POLY LF SZ8.5 (GLOVE) ×2 IMPLANT
GOWN STRL REUS W/ TWL LRG LVL3 (GOWN DISPOSABLE) IMPLANT
GOWN STRL REUS W/ TWL XL LVL3 (GOWN DISPOSABLE) ×1 IMPLANT
GOWN STRL REUS W/TWL 2XL LVL3 (GOWN DISPOSABLE) ×2 IMPLANT
GOWN STRL REUS W/TWL LRG LVL3 (GOWN DISPOSABLE)
GOWN STRL REUS W/TWL XL LVL3 (GOWN DISPOSABLE) ×2
HEMOSTAT POWDER KIT SURGIFOAM (HEMOSTASIS) ×2 IMPLANT
KIT BASIN OR (CUSTOM PROCEDURE TRAY) ×2 IMPLANT
KIT TURNOVER KIT B (KITS) ×2 IMPLANT
NDL HYPO 18GX1.5 BLUNT FILL (NEEDLE) IMPLANT
NDL HYPO 25X1 1.5 SAFETY (NEEDLE) ×1 IMPLANT
NDL SPNL 18GX3.5 QUINCKE PK (NEEDLE) ×1 IMPLANT
NEEDLE HYPO 18GX1.5 BLUNT FILL (NEEDLE) IMPLANT
NEEDLE HYPO 25X1 1.5 SAFETY (NEEDLE) ×2 IMPLANT
NEEDLE SPNL 18GX3.5 QUINCKE PK (NEEDLE) ×2 IMPLANT
NS IRRIG 1000ML POUR BTL (IV SOLUTION) ×2 IMPLANT
OIL CARTRIDGE MAESTRO DRILL (MISCELLANEOUS) ×2
PACK LAMINECTOMY NEURO (CUSTOM PROCEDURE TRAY) ×2 IMPLANT
PAD ARMBOARD 7.5X6 YLW CONV (MISCELLANEOUS) ×6 IMPLANT
SPONGE SURGIFOAM ABS GEL SZ50 (HEMOSTASIS) IMPLANT
STAPLER SKIN PROX WIDE 3.9 (STAPLE) IMPLANT
SUT VIC AB 0 CT1 18XCR BRD8 (SUTURE) ×1 IMPLANT
SUT VIC AB 0 CT1 8-18 (SUTURE) ×2
SUT VIC AB 2-0 CT1 18 (SUTURE) ×2 IMPLANT
SUT VIC AB 3-0 SH 8-18 (SUTURE) ×2 IMPLANT
SYR 5ML LL (SYRINGE) IMPLANT
TOWEL GREEN STERILE (TOWEL DISPOSABLE) ×2 IMPLANT
TOWEL GREEN STERILE FF (TOWEL DISPOSABLE) ×2 IMPLANT
WATER STERILE IRR 1000ML POUR (IV SOLUTION) ×2 IMPLANT

## 2021-01-25 NOTE — Brief Op Note (Signed)
01/25/2021  4:41 PM  PATIENT:  Alexander Booth  75 y.o. male  PRE-OPERATIVE DIAGNOSIS:   lumbar spinal and foraminal stenosis, lumbago, lumbar radiculopathy L 45 and L 5 S 1 levels  POST-OPERATIVE DIAGNOSIS:   lumbar spinal and foraminal stenosis, lumbago, lumbar radiculopathy L 45 and L 5 S 1 levels  PROCEDURE:  Procedure(s) with comments: Bilateral Lumbar 4-5, Lumbar 5 Sacral 1 Laminectomy/Foraminotomy (Bilateral) - 3C/RM 21  SURGEON:  Surgeon(s) and Role:    Erline Levine, MD - Primary    * Dawley, Theodoro Doing, DO - Assisting  PHYSICIAN ASSISTANT:   ASSISTANTS: Poteat, RN   ANESTHESIA:   general  EBL:  150 mL   BLOOD ADMINISTERED:none  DRAINS: none   LOCAL MEDICATIONS USED:  MARCAINE    and LIDOCAINE   SPECIMEN:  No Specimen  DISPOSITION OF SPECIMEN:  N/A  COUNTS:  YES  TOURNIQUET:  * No tourniquets in log *  DICTATION: Patient has bilateral foraminal stenosis at L4-5 and L 5 S 1 levels with significant bilateral leg weakness and intractable pain. It was elected to take him to surgery for bilateral L 45 and L 5 S 1 laminectomies and foraminotomies.  Procedure: Patient was brought to the operating room and following the smooth and uncomplicated induction of general endotracheal anesthesia he was placed in a prone position on the Wilson frame. Low back was prepped and draped in the usual sterile fashion with betadine scrub and DuraPrep. Preop localizing radiograph was obtained with a spinal needle.  Area of planned incision was infiltrated with local lidocaine. Incision was made in the midline and carried to the lumbodorsal fascia which was incised on both sides of midline. Subperiosteal dissection was performed exposing what was felt to be L45 level. Intraoperative x-ray demonstrated marker probe at L 45 and L 5 S 1. Bilateral foraminotomies of L4 and L5 was performed with leksell, then a high-speed drill and completed with Kerrison rongeurs and generous foraminotomy was  performed to decompress the L4 and L5 nerves. Decompression was performed with the operating microscope.  Ligamentum flavum was detached and removed in a piecemeal fashion and the bilateral  L5 nerve roots were decompressed laterally with removal of the superior aspect of the facet and ligamentum causing nerve root compression. Angled curettes were used to detatch and then remove thickened compressive ligamentous material. Bilateral foraminotomies of L 5  and S 1 was performed with leksell, then a high-speed drill and completed with Kerrison rongeurs and generous foraminotomy was performed to decompress the L 5 and S 1 nerves. Ligamentum flavum was detached and removed in a piecemeal fashion and the bilateral S 1 nerve roots were decompressed laterally with removal of the superior aspect of the facet and ligamentum causing nerve root compression. Angled curettes were used to detatch and then remove thickened compressive ligamentous material. At this point it was felt that all neural elements were well decompressed. The wound was then irrigated with saline. Hemostasis was assured with surgifoam  and the interspace was irrigated with Depo-Medrol and fentanyl. The lumbodorsal fascia was closed with 0 Vicryl sutures the subcutaneous tissues reapproximated 2-0 Vicryl inverted sutures and the skin edges were reapproximated with 3-0 Vicryl subcuticular stitch. The wound is dressed with Dermabond and an occlusive dressing. Patient was extubated in the operating room and taken to recovery in stable and satisfactory condition having tolerated his operation well counts were correct at the end of the case.    PLAN OF CARE: Admit for overnight  observation  PATIENT DISPOSITION:  PACU - hemodynamically stable.   Delay start of Pharmacological VTE agent (>24hrs) due to surgical blood loss or risk of bleeding: yes

## 2021-01-25 NOTE — Op Note (Signed)
01/25/2021  4:41 PM  PATIENT:  Alexander Booth  75 y.o. male  PRE-OPERATIVE DIAGNOSIS:   lumbar spinal and foraminal stenosis, lumbago, lumbar radiculopathy L 45 and L 5 S 1 levels  POST-OPERATIVE DIAGNOSIS:   lumbar spinal and foraminal stenosis, lumbago, lumbar radiculopathy L 45 and L 5 S 1 levels  PROCEDURE:  Procedure(s) with comments: Bilateral Lumbar 4-5, Lumbar 5 Sacral 1 Laminectomy/Foraminotomy (Bilateral) - 3C/RM 21  SURGEON:  Surgeon(s) and Role:    Erline Levine, MD - Primary    * Dawley, Theodoro Doing, DO - Assisting  PHYSICIAN ASSISTANT:   ASSISTANTS: Poteat, RN   ANESTHESIA:   general  EBL:  150 mL   BLOOD ADMINISTERED:none  DRAINS: none   LOCAL MEDICATIONS USED:  MARCAINE    and LIDOCAINE   SPECIMEN:  No Specimen  DISPOSITION OF SPECIMEN:  N/A  COUNTS:  YES  TOURNIQUET:  * No tourniquets in log *  DICTATION: Patient has bilateral foraminal stenosis at L4-5 and L 5 S 1 levels with significant bilateral leg weakness and intractable pain. It was elected to take him to surgery for bilateral L 45 and L 5 S 1 laminectomies and foraminotomies.  Procedure: Patient was brought to the operating room and following the smooth and uncomplicated induction of general endotracheal anesthesia he was placed in a prone position on the Wilson frame. Low back was prepped and draped in the usual sterile fashion with betadine scrub and DuraPrep. Preop localizing radiograph was obtained with a spinal needle.  Area of planned incision was infiltrated with local lidocaine. Incision was made in the midline and carried to the lumbodorsal fascia which was incised on both sides of midline. Subperiosteal dissection was performed exposing what was felt to be L45 level. Intraoperative x-ray demonstrated marker probe at L 45 and L 5 S 1. Bilateral foraminotomies of L4 and L5 was performed with leksell, then a high-speed drill and completed with Kerrison rongeurs and generous foraminotomy was  performed to decompress the L4 and L5 nerves. Decompression was performed with the operating microscope.  Ligamentum flavum was detached and removed in a piecemeal fashion and the bilateral  L5 nerve roots were decompressed laterally with removal of the superior aspect of the facet and ligamentum causing nerve root compression. Angled curettes were used to detatch and then remove thickened compressive ligamentous material. Bilateral foraminotomies of L 5  and S 1 was performed with leksell, then a high-speed drill and completed with Kerrison rongeurs and generous foraminotomy was performed to decompress the L 5 and S 1 nerves. Ligamentum flavum was detached and removed in a piecemeal fashion and the bilateral S 1 nerve roots were decompressed laterally with removal of the superior aspect of the facet and ligamentum causing nerve root compression. Angled curettes were used to detatch and then remove thickened compressive ligamentous material. At this point it was felt that all neural elements were well decompressed. The wound was then irrigated with saline. Hemostasis was assured with surgifoam  and the interspace was irrigated with Depo-Medrol and fentanyl. The lumbodorsal fascia was closed with 0 Vicryl sutures the subcutaneous tissues reapproximated 2-0 Vicryl inverted sutures and the skin edges were reapproximated with 3-0 Vicryl subcuticular stitch. The wound is dressed with Dermabond and an occlusive dressing. Patient was extubated in the operating room and taken to recovery in stable and satisfactory condition having tolerated his operation well counts were correct at the end of the case.    PLAN OF CARE: Admit for overnight  observation  PATIENT DISPOSITION:  PACU - hemodynamically stable.   Delay start of Pharmacological VTE agent (>24hrs) due to surgical blood loss or risk of bleeding: yes

## 2021-01-25 NOTE — Transfer of Care (Signed)
Immediate Anesthesia Transfer of Care Note  Patient: Alexander Booth [facility]  Procedure(s) Performed: Bilateral Lumbar 4-5, Lumbar 5 Sacral 1 Laminectomy/Foraminotomy (Bilateral: Back)  Patient Location: PACU  Anesthesia Type:General  Level of Consciousness: drowsy  Airway & Oxygen Therapy: Patient Spontanous Breathing and Patient connected to face mask oxygen  Post-op Assessment: Report given to RN and Post -op Vital signs reviewed and stable  Post vital signs: Reviewed and stable  Last Vitals:  Vitals Value Taken Time  BP 134/52 01/25/21 1637  Temp    Pulse 70 01/25/21 1640  Resp 14 01/25/21 1640  SpO2 100 % 01/25/21 1640  Vitals shown include unvalidated device data.  Last Pain:  Vitals:   01/25/21 1229  TempSrc:   PainSc: 0-No pain         Complications: No notable events documented.

## 2021-01-25 NOTE — Interval H&P Note (Signed)
History and Physical Interval Note:  01/25/2021 2:08 PM  Why  has presented today for surgery, with the diagnosis of Radiculopathy, Lumbar region.  The various methods of treatment have been discussed with the patient and family. After consideration of risks, benefits and other options for treatment, the patient has consented to  Procedure(s) with comments: Bilateral Lumbar 4-5, Lumbar 5 Sacral 1 Laminectomy/Foraminotomy (Bilateral) - 3C/RM 21 as a surgical intervention.  The patient's history has been reviewed, patient examined, no change in status, stable for surgery.  I have reviewed the patient's chart and labs.  Questions were answered to the patient's satisfaction.     Alexander Booth

## 2021-01-25 NOTE — Anesthesia Procedure Notes (Signed)
Procedure Name: Intubation Date/Time: 01/25/2021 2:20 PM Performed by: Reeves Dam, CRNA Pre-anesthesia Checklist: Patient identified, Emergency Drugs available, Suction available and Patient being monitored Patient Re-evaluated:Patient Re-evaluated prior to induction Oxygen Delivery Method: Circle system utilized Preoxygenation: Pre-oxygenation with 100% oxygen Induction Type: IV induction Ventilation: Mask ventilation without difficulty Laryngoscope Size: Miller and 3 Grade View: Grade I Tube type: Oral Tube size: 7.5 mm Number of attempts: 1 Airway Equipment and Method: Stylet and Oral airway Placement Confirmation: ETT inserted through vocal cords under direct vision, positive ETCO2 and breath sounds checked- equal and bilateral Secured at: 23 cm Tube secured with: Tape Dental Injury: Teeth and Oropharynx as per pre-operative assessment

## 2021-01-26 ENCOUNTER — Encounter (HOSPITAL_COMMUNITY): Payer: Self-pay | Admitting: Neurosurgery

## 2021-01-26 DIAGNOSIS — Z885 Allergy status to narcotic agent status: Secondary | ICD-10-CM | POA: Diagnosis not present

## 2021-01-26 DIAGNOSIS — R2689 Other abnormalities of gait and mobility: Secondary | ICD-10-CM | POA: Diagnosis not present

## 2021-01-26 DIAGNOSIS — Z79899 Other long term (current) drug therapy: Secondary | ICD-10-CM | POA: Diagnosis not present

## 2021-01-26 DIAGNOSIS — G20 Parkinson's disease: Secondary | ICD-10-CM | POA: Diagnosis not present

## 2021-01-26 DIAGNOSIS — M4807 Spinal stenosis, lumbosacral region: Secondary | ICD-10-CM | POA: Diagnosis not present

## 2021-01-26 DIAGNOSIS — M4726 Other spondylosis with radiculopathy, lumbar region: Secondary | ICD-10-CM | POA: Diagnosis not present

## 2021-01-26 LAB — GLUCOSE, CAPILLARY: Glucose-Capillary: 205 mg/dL — ABNORMAL HIGH (ref 70–99)

## 2021-01-26 MED ORDER — OXYCODONE-ACETAMINOPHEN 5-325 MG PO TABS: 1.0000 | ORAL_TABLET | Freq: Four times a day (QID) | ORAL | 0 refills | Status: AC | PRN

## 2021-01-26 NOTE — Evaluation (Signed)
Occupational Therapy Evaluation Patient Details Name: Alexander Booth MRN: [id number] DOB: 03-19-1946 Today's Date: 01/26/2021    History of Present Illness Pt is a 75 y/o male who presents s/p bilateral L4/5 and L5/S1, laminectomy/foraminotomy. PMH significant for DM, dementia, peripheral neuropathy, HTN, COPD, L TKR, L1 kyphoplasty 2014, ACDF., Parkinson's.   Clinical Impression   Patient admitted for diagnosis and procedure above.  PTA she lives with his spouse, who can assist as needed.  His daughter lives nearby, and will assist as well.  Barriers are listed below.  Currently hs is very close to his baseline, he has a history of falls, and has a diagnosis of parkinson's.  Patient verbalized understanding of education, and all questions answered.   No further OT needs identified in the acute setting.      Follow Up Recommendations  No OT follow up    Equipment Recommendations  None recommended by OT    Recommendations for Other Services       Precautions / Restrictions Precautions Precautions: Back Precaution Booklet Issued: Yes (comment) Precaution Comments: Reviewed Restrictions Weight Bearing Restrictions: No      Mobility Bed Mobility Overal bed mobility: Modified Independent               Patient Response: Cooperative  Transfers Overall transfer level: Modified independent Equipment used: Rolling walker (2 wheeled)                  Balance Overall balance assessment: Mild deficits observed, not formally tested                                         ADL either performed or assessed with clinical judgement   ADL Overall ADL's : At baseline                                       General ADL Comments: Able to complete ADL from sit/stand level with good follow through of precautions.     Vision Patient Visual Report: No change from baseline       Perception     Praxis      Pertinent Vitals/Pain Pain  Assessment: Faces Faces Pain Scale: Hurts little more Pain Location: incision site Pain Descriptors / Indicators: Operative site guarding Pain Intervention(s): Monitored during session     Hand Dominance Right   Extremity/Trunk Assessment Upper Extremity Assessment Upper Extremity Assessment: Generalized weakness   Lower Extremity Assessment Lower Extremity Assessment: Defer to PT evaluation   Cervical / Trunk Assessment Cervical / Trunk Assessment: Other exceptions Cervical / Trunk Exceptions: spine surgery   Communication Communication Communication: HOH   Cognition Arousal/Alertness: Awake/alert Behavior During Therapy: WFL for tasks assessed/performed Overall Cognitive Status: History of cognitive impairments - at baseline                                 General Comments: following commands and good safety   General Comments  history of falls    Exercises     Shoulder Instructions      Home Living Family/patient expects to be discharged to:: Private residence Living Arrangements: Spouse/significant other Available Help at Discharge: Family;Available 24 hours/day Type of Home: House Home Access: Stairs to enter [employer] of Steps:  4 Entrance Stairs-Rails: Right;Left;Can reach both Home Layout: One level     Bathroom Shower/Tub: Tub/shower unit;Walk-in shower   Bathroom Toilet: Standard Bathroom Accessibility: Yes How Accessible: Accessible via walker Home Equipment: Shower seat;Grab bars - tub/shower;Walker - 2 wheels;Cane - single point;Bedside commode;Hand held shower head          Prior Functioning/Environment Level of Independence: Needs assistance  Gait / Transfers Assistance Needed: Ambulates with a cane at times but also uses the walker frequently due to pain.  Using Rw more. ADL's / Homemaking Assistance Needed: Able to complete his own ADL, but spouse assists with meals, home management and community mobility.             OT Problem List: Impaired balance (sitting and/or standing);Pain      OT Treatment/Interventions:      OT Goals(Current goals can be found in the care plan section) Acute Rehab OT Goals Patient Stated Goal: Patient ready to go home and recover OT Goal Formulation: With patient Time For Goal Achievement: 01/26/21 Potential to Achieve Goals: Good  OT Frequency:     Barriers to D/C:  None noted          Co-evaluation              AM-PAC OT "6 Clicks" Daily Activity     Outcome Measure Help from another person eating meals?: None Help from another person taking care of personal grooming?: None Help from another person toileting, which includes using toliet, bedpan, or urinal?: None Help from another person bathing (including washing, rinsing, drying)?: A Little Help from another person to put on and taking off regular upper body clothing?: None Help from another person to put on and taking off regular lower body clothing?: A Little 6 Click Score: 22   End of Session Equipment Utilized During Treatment: Rolling walker  Activity Tolerance: Patient tolerated treatment well Patient left: in bed;with call bell/phone within reach;with family/visitor present  OT Visit Diagnosis: Unsteadiness on feet (R26.81);Pain Pain - Right/Left:  (low back)                Time: 6222-9798 OT Time Calculation (min): 21 min Charges:  OT General Charges $OT Visit: 1 Visit OT Evaluation $OT Eval Moderate Complexity: 1 Mod  01/26/2021  Alexander Booth  Acute Rehabilitation Services  Office:  [phone]   Metta Clines 01/26/2021, 9:42 AM

## 2021-01-26 NOTE — Progress Notes (Signed)
Patient is discharged from room 3C08 at this time. Alert and in stable condition. IV site d/c'd and instructions read to patient and daughter with understanding verbalized and all questions answered. Left unit via wheelchair with all belongings at side.

## 2021-01-26 NOTE — Progress Notes (Signed)
Subjective: Patient reports that he is doing well and is pleased with his postoperative progress. He has mild incisional discomfort. No acute events overnight.   Objective: Vital signs in last 24 hours: Temp:  [97.1 F (36.2 C)-98 F (36.7 C)] 97.5 F (36.4 C) (07/08 0733) Pulse Rate:  [62-76] 76 (07/08 0733) Resp:  [9-23] 18 (07/08 0733) BP: (121-143)/(48-65) 123/50 (07/08 0733) SpO2:  [92 %-100 %] 95 % (07/08 0733) Weight:  [86.2 kg] 86.2 kg (07/07 1127)  Intake/Output from previous day: 07/07 0701 - 07/08 0700 In: 900 [I.V.:900] Out: 150 [Blood:150] Intake/Output this shift: No intake/output data recorded.  Physical Exam: Patient is awake, A/O X 4, conversant, and in good spirits. Speech is fluent and appropriate. Doing well. MAEW with good strength that is symmetric bilaterally. 5/5 BUE/BLE.   Dressing is clean dry intact. Incision is well approximated with no drainage, erythema, or edema.    Lab Results: Recent Labs    01/23/21 0938  WBC 6.1  HGB 12.5*  HCT 38.0*  PLT 168   BMET Recent Labs    01/23/21 0938  NA 135  K 4.3  CL 100  CO2 28  GLUCOSE 122*  BUN 13  CREATININE 1.19  CALCIUM 9.2    Studies/Results: DG Lumbar Spine 2-3 Views  Result Date: 01/25/2021 CLINICAL DATA:  L4-S1 laminectomy EXAM: LUMBAR SPINE - 2-3 VIEW COMPARISON:  MRI lumbar spine 11/28/2020 FINDINGS: Portable cross-table views of the lower lumbar spine are obtained intraoperatively for surgical localization purposes. The initial image demonstrates a needle or localization marker posterior to the L5-S1 interspace. Subsequent image demonstrates localization marker is placed posterior to the L4-5 and L5-S1 levels, spanning the L5 spinous process. Degenerative changes are noted in the lower lumbar spine. IMPRESSION: Intraoperative views of the lumbar spine obtained for surgical localization purposes. Electronically Signed   By: Lucienne Capers M.D.   On: 01/25/2021 19:32     Assessment/Plan: Patient is post-op day 1 s/p bilateral L4/5 and L5/S1, laminectomy/foraminotomy. He is recovering well and reports a resolution of his preoperative symptoms.  His only complaint is mild incisional discomfort.  He has ambulated with nursing staff and is awaiting PT/OT evaluation. Continue working on pain control, mobility and ambulating patient. Will plan for discharge today.    LOS: 0 days     [name], DNP, NP-C 01/26/2021, 7:51 AM

## 2021-01-26 NOTE — Discharge Summary (Signed)
Physician Discharge Summary  Patient ID: Alexander Booth MRN: [id number] DOB/AGE: 77-23-1947 77 y.o.  Admit date: 01/25/2021 Discharge date: 01/26/2021  Admission Diagnoses: lumbar spinal and foraminal stenosis, lumbago, lumbar radiculopathy L 45 and L 5 S 1 levels   Discharge Diagnoses: lumbar spinal and foraminal stenosis, lumbago, lumbar radiculopathy L 45 and L 5 S 1 levels   Active Problems:   Spinal stenosis of lumbar region with radiculopathy   Discharged Condition: good  Hospital Course: The patient was admitted on 01/26/2021 and taken to the operating room where the patient underwent bilateral L4/5 and L5/S1, laminectomy/foraminotomy. The patient tolerated the procedure well and was taken to the recovery room and then to the floor in stable condition. The hospital course was routine. There were no complications. The wound remained clean dry and intact. Pt had appropriate back soreness. No complaints of leg pain or new N/T/W. The patient remained afebrile with stable vital signs, and tolerated a regular diet. The patient continued to increase activities, and pain was well controlled with oral pain medications.   Consults: None  Significant Diagnostic Studies: radiology: X-Ray: intraoperative   Treatments: surgery: Bilateral Lumbar 4-5, Lumbar 5 Sacral 1 Laminectomy/Foraminotomy (Bilateral)  Discharge Exam: Blood pressure (!) 123/50, pulse 76, temperature (!) 97.5 F (36.4 C), temperature source Oral, resp. rate 18, height 6' (1.829 m), weight 86.2 kg, SpO2 96 %.  Physical Exam: Patient is awake, A/O X 4, conversant, and in good spirits. Speech is fluent and appropriate. Doing well. MAEW with good strength that is symmetric bilaterally. 5/5 BUE/BLE.   Dressing is clean dry intact. Incision is well approximated with no drainage, erythema, or edema.    Disposition: Discharge disposition: 01-Home or Self Care        Allergies as of 01/26/2021       Reactions   Codeine  Hives, Itching        Medication List     STOP taking these medications    HYDROcodone-acetaminophen 10-325 MG tablet Commonly known as: NORCO   meloxicam 15 MG tablet Commonly known as: MOBIC       TAKE these medications    Accu-Chek Aviva Plus test strip Generic drug: glucose blood Check sugar 2 times daily. DX E11.8   Accu-Chek Aviva Plus w/Device Kit Check sugar 2 times daily. DX E11.8   Accu-Chek Softclix Lancets lancets Check sugar 2 times daily. DX E11.8   acetaminophen 500 MG tablet Commonly known as: TYLENOL Take 1,000 mg by mouth every 6 (six) hours as needed for moderate pain.   aspirin EC 81 MG tablet Take 81 mg by mouth daily with breakfast.   Breo Ellipta 100-25 MCG/INH Aepb Generic drug: fluticasone furoate-vilanterol INHALE 1 PUFF BY MOUTH EVERY DAY   cyclobenzaprine 5 MG tablet Commonly known as: FLEXERIL Take 5 mg by mouth 2 (two) times daily.   famotidine 10 MG tablet Commonly known as: PEPCID Take 2 tablets (20 mg total) by mouth daily.   furosemide 20 MG tablet Commonly known as: LASIX Take 20 mg by mouth 2 (two) times daily.   LORazepam 0.5 MG tablet Commonly known as: ATIVAN TAKE 1 TABLET BY MOUTH EVERYDAY AT BEDTIME   memantine 7 MG Cp24 24 hr capsule Commonly known as: NAMENDA XR TAKE 1 CAPSULE (7 MG TOTAL) BY MOUTH EVERY MORNING.   nitroGLYCERIN 0.4 MG SL tablet Commonly known as: NITROSTAT Place 0.4 mg under the tongue every 5 (five) minutes x 3 doses as needed for chest pain.   oxyCODONE-acetaminophen 5-325  MG tablet Commonly known as: Percocet Take 1-2 tablets by mouth every 6 (six) hours as needed for severe pain.   QC Vitamin D3 50 MCG (2000 UT) Tabs Generic drug: Cholecalciferol Take 2,000 Units by mouth daily with lunch.   simvastatin 40 MG tablet Commonly known as: ZOCOR Take 1 tablet (40 mg total) by mouth daily at 6 PM.   vitamin C 500 MG tablet Commonly known as: ASCORBIC ACID Take 500 mg by mouth  daily with lunch.       ASK your doctor about these medications    carbidopa-levodopa 25-100 MG tablet Commonly known as: SINEMET IR TAKE 1 TABLET THREE TIMES DAILY (AT 7AM, 11AM, 4PM)   citalopram 20 MG tablet Commonly known as: CELEXA TAKE 1 TABLET EVERY DAY   divalproex 500 MG 24 hr tablet Commonly known as: DEPAKOTE ER TAKE 2 TABLETS EVERY DAY   donepezil 10 MG tablet Commonly known as: ARICEPT TAKE 1 TABLET EVERY MORNING   gabapentin 400 MG capsule Commonly known as: NEURONTIN TAKE 3 CAPSULES AT BEDTIME AND MAY REPEAT DOSE ONE TIME IF NEEDED   metFORMIN 500 MG tablet Commonly known as: GLUCOPHAGE TAKE 1 TABLET TWICE DAILY WITH LUNCH AND WITH SUPPER (PER ADMIN INSTRUCTIONS)   mirtazapine 45 MG tablet Commonly known as: REMERON TAKE 1 TABLET AT BEDTIME   mupirocin ointment 2 % Commonly known as: Bactroban Apply 1 application topically 2 (two) times daily.   ProAir RespiClick 735 (90 Base) MCG/ACT Aepb Generic drug: Albuterol Sulfate Inhale 1-2 puffs into the lungs every 4 (four) hours as needed.   tamsulosin 0.4 MG Caps capsule Commonly known as: FLOMAX TAKE 1 CAPSULE BY MOUTH EVERY DAY         Signed: [name], DNP, NP-C 01/26/2021, 9:20 AM

## 2021-01-26 NOTE — Discharge Instructions (Addendum)
Wound Care Leave incision open to air. You may shower. Do not scrub directly on incision.  Do not put any creams, lotions, or ointments on incision. Activity Walk each and every day, increasing distance each day. No lifting greater than 5 lbs.  Avoid bending, arching, and twisting. No driving for 2 weeks; may ride as a passenger locally. Diet Resume your normal diet.  Return to Work Will be discussed at you follow up appointment. Call Your Doctor If Any of These Occur Redness, drainage, or swelling at the wound.  Temperature greater than 101 degrees. Severe pain not relieved by pain medication. Incision starts to come apart. Follow Up Appt Call today for appointment in 2-4 weeks ([phone]) or for problems.  If you have any hardware placed in your spine, you will need an x-ray before your appointment.

## 2021-01-26 NOTE — Evaluation (Signed)
Physical Therapy Evaluation Patient Details Name: Alexander Booth MRN: [id number] DOB: 1945-08-07 Today's Date: 01/26/2021   History of Present Illness  Pt is a 75 y/o male who presents s/p bilateral L4/5 and L5/S1, laminectomy/foraminotomy. PMH significant for DM, dementia, peripheral neuropathy, HTN, COPD, L TKR, L1 kyphoplasty 2014, ACDF.  Clinical Impression  PTA pt living with spouse in single story home with 4 steps with rails on both sides. Pt was ambulating household distances with RW, independent in bADLs, wife provides for iADLs. Wife and daughter will help at discharge. Pt is currently limited by expected pain at surgical site, as well as generalized weakness in BLE. Pt is currently mod I for bed mobility and transfers and supervision for ambulation and min A for ascent/descent of 5 steps. D/c plan remain appropriate at this time.    Follow Up Recommendations Supervision for mobility/OOB    Equipment Recommendations  Rolling walker with 5" wheels;3in1 (PT)       Precautions / Restrictions Precautions Precautions: Back Precaution Booklet Issued: Yes (comment) Precaution Comments: Reviewed able to recall 2/3 precautions at end of session Restrictions Weight Bearing Restrictions: No      Mobility  Bed Mobility Overal bed mobility: Modified Independent             General bed mobility comments: sitting EoB on entry    Transfers Overall transfer level: Modified independent Equipment used: Rolling walker (2 wheeled)             General transfer comment: good hand placement for power up and steadying using RW  Ambulation/Gait Ambulation/Gait assistance: Supervision Gait Distance (Feet): 30 Feet Assistive device: Rolling walker (2 wheeled) Gait Pattern/deviations: Step-through pattern;Decreased dorsiflexion - right;Shuffle;Trunk flexed Gait velocity: slowed Gait velocity interpretation: <1.8 ft/sec, indicate of risk for recurrent falls General Gait Details:  slowed, shuffling gait, vc for picking up feet with ambulation  Stairs Stairs: Yes Stairs assistance: Min assist Stair Management: Backwards;Step to pattern;Two rails Number of Stairs: 4 General stair comments: vc for sequencing      Balance Overall balance assessment: Mild deficits observed, not formally tested                                           Pertinent Vitals/Pain Pain Assessment: 0-10 Pain Score: 10-Worst pain ever Faces Pain Scale: Hurts even more Pain Location: incision site Pain Descriptors / Indicators: Operative site guarding Pain Intervention(s): Monitored during session;Limited activity within patient's tolerance;Repositioned;Patient requesting pain meds-RN notified    Home Living Family/patient expects to be discharged to:: Private residence Living Arrangements: Spouse/significant other Available Help at Discharge: Family;Available 24 hours/day Type of Home: House Home Access: Stairs to enter Entrance Stairs-Rails: Right;Left;Can reach both Entrance Stairs-Number of Steps: 4 Home Layout: One level Home Equipment: Shower seat;Grab bars - tub/shower;Walker - 2 wheels;Cane - single point;Bedside commode;Hand held shower head      Prior Function Level of Independence: Needs assistance   Gait / Transfers Assistance Needed: Ambulates with a cane at times but also uses the walker frequently due to pain.  Using Rw more.  ADL's / Homemaking Assistance Needed: Able to complete his own ADL, but spouse assists with meals, home management and community mobility.        Hand Dominance   Dominant Hand: Right    Extremity/Trunk Assessment   Upper Extremity Assessment Upper Extremity Assessment: Generalized weakness    Lower Extremity  Assessment Lower Extremity Assessment: Generalized weakness    Cervical / Trunk Assessment Cervical / Trunk Assessment: Other exceptions Cervical / Trunk Exceptions: spine surgery  Communication    Communication: HOH  Cognition Arousal/Alertness: Awake/alert Behavior During Therapy: WFL for tasks assessed/performed Overall Cognitive Status: History of cognitive impairments - at baseline                                 General Comments: following commands and good safety      General Comments General comments (skin integrity, edema, etc.): hx of falls        Assessment/Plan    PT Assessment Patent does not need any further PT services  PT Problem List         PT Treatment Interventions      PT Goals (Current goals can be found in the Care Plan section)  Acute Rehab PT Goals Patient Stated Goal: anxious to get home PT Goal Formulation: With patient Time For Goal Achievement: 02/09/21 Potential to Achieve Goals: Good     AM-PAC PT "6 Clicks" Mobility  Outcome Measure Help needed turning from your back to your side while in a flat bed without using bedrails?: None Help needed moving from lying on your back to sitting on the side of a flat bed without using bedrails?: None Help needed moving to and from a bed to a chair (including a wheelchair)?: None Help needed standing up from a chair using your arms (e.g., wheelchair or bedside chair)?: None Help needed to walk in hospital room?: None Help needed climbing 3-5 steps with a railing? : A Little 6 Click Score: 23    End of Session Equipment Utilized During Treatment: Gait belt Activity Tolerance: Patient tolerated treatment well Patient left: in bed;with family/visitor present Nurse Communication: Mobility status (equipment needs) PT Visit Diagnosis: Other abnormalities of gait and mobility (R26.89);Muscle weakness (generalized) (M62.81);Difficulty in walking, not elsewhere classified (R26.2);Pain    Time: 8527-7824 PT Time Calculation (min) (ACUTE ONLY): 19 min   Charges:   PT Evaluation $PT Eval Low Complexity: 1 Low          [name] B. Migdalia Dk PT, DPT Acute Rehabilitation  Services Pager [phone] Office [phone]   [place] 01/26/2021, 10:21 AM

## 2021-01-26 NOTE — Anesthesia Postprocedure Evaluation (Signed)
Anesthesia Post Note  Patient: Alexander Booth [facility]  Procedure(s) Performed: Bilateral Lumbar 4-5, Lumbar 5 Sacral 1 Laminectomy/Foraminotomy (Bilateral: Back)     Patient location during evaluation: PACU Anesthesia Type: General Level of consciousness: awake and alert Pain management: pain level controlled Vital Signs Assessment: post-procedure vital signs reviewed and stable Respiratory status: spontaneous breathing, nonlabored ventilation, respiratory function stable and patient connected to nasal cannula oxygen Cardiovascular status: blood pressure returned to baseline and stable Postop Assessment: no apparent nausea or vomiting Anesthetic complications: no   No notable events documented.  Last Vitals:  Vitals:   01/26/21 0733 01/26/21 0820  BP: (!) 123/50   Pulse: 76   Resp: 18   Temp: (!) 36.4 C   SpO2: 95% 96%    Last Pain:  Vitals:   01/26/21 0733  TempSrc: Oral  PainSc:                  Savvas Roper S

## 2021-02-05 ENCOUNTER — Other Ambulatory Visit: Payer: Self-pay | Admitting: Internal Medicine

## 2021-02-06 NOTE — Telephone Encounter (Signed)
O/v 10/27/20 No f/u made  R/f 12/05/2020 #90 no refills.

## 2021-02-10 ENCOUNTER — Other Ambulatory Visit: Payer: Self-pay | Admitting: Internal Medicine

## 2021-02-10 NOTE — Telephone Encounter (Signed)
  Notes to clinic Please assess, pharm notes pt states taking two times a day, rx is for three times a day.

## 2021-02-12 ENCOUNTER — Telehealth: Payer: Self-pay | Admitting: Neurology

## 2021-02-12 NOTE — Telephone Encounter (Signed)
Patient's daughter Alexander Booth called and said her father is having trouble with feeling unstable after getting up from a sitting position.   She said he is taking carbidopa-levodopa 25-100 MG and that may be causing it and wants Dr. Doristine Devoid recommendations.

## 2021-02-13 NOTE — Telephone Encounter (Signed)
I don't think that I can accurately comment on sx's.  Pt not been seen here for over a year.  Looks like several appts cx (for me and neuropsych).  Probably need to talk to person prescribing his levodopa (looks like NP)

## 2021-02-13 NOTE — Telephone Encounter (Signed)
Spoke to patients daughter [name] and informed her that Dr. Carles Collet cannot comment on sx's as patient has not been seen in over a year. Advised patients daughter to contact NP regina who has been filling the medication for patient. [name] verbalized understanding and will contact the NP [facility].

## 2021-02-14 ENCOUNTER — Other Ambulatory Visit: Payer: Self-pay | Admitting: Internal Medicine

## 2021-02-14 NOTE — Telephone Encounter (Signed)
Requested medication (s) are due for refill today: yes   Requested medication (s) are on the active medication list:  yes  Last refill:  01/15/2021  Future visit scheduled: no  Notes to clinic:  This refill cannot be delegated   Requested Prescriptions  Pending Prescriptions Disp Refills   LORazepam (ATIVAN) 0.5 MG tablet [Pharmacy Med Name: LORAZEPAM 0.5 MG TABLET] 30 tablet 0    Sig: TAKE 1 TABLET BY MOUTH EVERYDAY AT BEDTIME      Not Delegated - Psychiatry:  Anxiolytics/Hypnotics Failed - 02/14/2021  9:51 AM      Failed - This refill cannot be delegated      Failed - Urine Drug Screen completed in last 360 days      Passed - Valid encounter within last 6 months    Recent Outpatient Visits           1 month ago Sacral pain   [facility] [place], Coralie Keens, NP

## 2021-02-23 DIAGNOSIS — J449 Chronic obstructive pulmonary disease, unspecified: Secondary | ICD-10-CM | POA: Diagnosis not present

## 2021-02-26 DIAGNOSIS — M542 Cervicalgia: Secondary | ICD-10-CM | POA: Diagnosis not present

## 2021-02-26 DIAGNOSIS — M4807 Spinal stenosis, lumbosacral region: Secondary | ICD-10-CM | POA: Diagnosis not present

## 2021-02-26 DIAGNOSIS — M5416 Radiculopathy, lumbar region: Secondary | ICD-10-CM | POA: Diagnosis not present

## 2021-02-26 DIAGNOSIS — G20 Parkinson's disease: Secondary | ICD-10-CM | POA: Diagnosis not present

## 2021-03-10 ENCOUNTER — Other Ambulatory Visit: Payer: Self-pay | Admitting: Internal Medicine

## 2021-03-10 NOTE — Telephone Encounter (Signed)
last RF 10/25/20 #90 1 RF

## 2021-03-14 ENCOUNTER — Other Ambulatory Visit: Payer: Self-pay | Admitting: Internal Medicine

## 2021-03-14 ENCOUNTER — Encounter: Payer: Self-pay | Admitting: Internal Medicine

## 2021-03-14 MED ORDER — LORAZEPAM 0.5 MG PO TABS: ORAL_TABLET | ORAL | 0 refills | Status: DC

## 2021-03-14 NOTE — Telephone Encounter (Signed)
Requested medications are due for refill today.  unknown  Requested medications are on the active medications list.  no  Last refill. 10/20/2020  Future visit scheduled.   no  Notes to clinic.  Medication was discontinued 01/26/2021

## 2021-03-21 DIAGNOSIS — D229 Melanocytic nevi, unspecified: Secondary | ICD-10-CM | POA: Diagnosis not present

## 2021-03-21 DIAGNOSIS — D485 Neoplasm of uncertain behavior of skin: Secondary | ICD-10-CM | POA: Diagnosis not present

## 2021-03-21 DIAGNOSIS — L819 Disorder of pigmentation, unspecified: Secondary | ICD-10-CM | POA: Diagnosis not present

## 2021-03-21 DIAGNOSIS — L905 Scar conditions and fibrosis of skin: Secondary | ICD-10-CM | POA: Diagnosis not present

## 2021-03-21 DIAGNOSIS — L570 Actinic keratosis: Secondary | ICD-10-CM | POA: Diagnosis not present

## 2021-03-21 DIAGNOSIS — L814 Other melanin hyperpigmentation: Secondary | ICD-10-CM | POA: Diagnosis not present

## 2021-03-21 DIAGNOSIS — Z85828 Personal history of other malignant neoplasm of skin: Secondary | ICD-10-CM | POA: Diagnosis not present

## 2021-03-21 DIAGNOSIS — L821 Other seborrheic keratosis: Secondary | ICD-10-CM | POA: Diagnosis not present

## 2021-03-26 DIAGNOSIS — J449 Chronic obstructive pulmonary disease, unspecified: Secondary | ICD-10-CM | POA: Diagnosis not present

## 2021-04-02 NOTE — Progress Notes (Signed)
Assessment/Plan:   1.  Parkinsons Disease  -Increase carbidopa/levodopa 25/100, 1.5 tablet 3 times per day.  -needs PT for shuffling gait but he refuses.  I want him to reconsider but he doesn't want to.  2.  Memory change  -Previously had neurocognitive testing scheduled, but canceled that.  -As with our previous records, I am not convinced of a neurodegenerative dementia.  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  -As previous, he had a PET scan all the way back in 2004 that was concerning for possible Alzheimer's disease, but he would not have had Alzheimer's disease for over 20 years without [place] knowing it clinically.  3.  Gait instability  -While I do think Parkinson's disease certainly plays an issue here (and perhaps the primary issue which is why I want him to do Parkinsons Disease PT), I am concerned about the opioid narcotics and the benzodiazepines.  Would like to see these limited, if not completely eliminated.  Understand completely that patient did recently just have low back surgery with Dr. Vertell Limber.  The opioids proceeded the back surgery, but hopefully he will be able to eliminate these now that he has had back surgery completed.  Pt reports still with pain.  Benzodiazepines have been longstanding and I would like to see these eliminated completely.  He and I discussed that today   Subjective:   Alexander Booth was seen today in follow up for Parkinsons disease.  My previous records were reviewed prior to todays visit as well as outside records available to me.  Diagnosis was made last visit.  However, that was over a year ago.  Patient has canceled multiple appointments since that time.  Started him on levodopa in July, 2021.  His nurse practitioner has been feeling his medication for him and he reports faithful with med.  However, he doesn't know when he takes it as daughter gives it to him  (fills pill box) and she is not here.  Neurocognitive testing was ordered last visit.  Patient canceled that.  Daughter did call me at the end of July about concerns about balance change when he first got up and thought it was related to levodopa.  I told her that I had not seen him for over a year and was not sure I could comment. Patient did recently have L4-L5 and L5-S1 laminectomy/foraminotomies.  This was done with Dr. Vertell Limber in early July.  I did have the opportunity to review the patient's PDMP.  He is on lorazepam as well as hydrocodone.  He received #120 hydrocodone on August 8, in addition to #20 oxycodone on July 28 and #45 lorazepam on August 26.  States he has had bunch of falls this year.  States that he did fall in bath since surgery.  Did tell the doc about it and saw doc since he fell.  Often uses cane with ambulation.  Gets dizzy with standing.    Current prescribed movement disorder medications: carbidopa/levodopa 25/100 tid (pt states that not sure when taking - states that daughter does meds)   PREVIOUS MEDICATIONS: Sinemet  ALLERGIES:   Allergies  Allergen Reactions   Codeine Hives and Itching    CURRENT MEDICATIONS:  Outpatient Encounter Medications as of 04/03/2021  Medication Sig   Accu-Chek Softclix Lancets lancets Check sugar 2 times daily. DX E11.8   acetaminophen (TYLENOL) 500 MG tablet Take 1,000 mg by mouth every 6 (six)  hours as needed for moderate pain.   Albuterol Sulfate (PROAIR RESPICLICK) 417 (90 Base) MCG/ACT AEPB Inhale 1-2 puffs into the lungs every 4 (four) hours as needed. (Patient taking differently: Inhale 1-2 puffs into the lungs every 4 (four) hours as needed (shortness of breath).)   aspirin EC 81 MG tablet Take 81 mg by mouth daily with breakfast.   Blood Glucose Monitoring Suppl (ACCU-CHEK AVIVA PLUS) w/Device KIT Check sugar 2 times daily. DX E11.8   BREO ELLIPTA 100-25 MCG/INH AEPB INHALE 1 PUFF BY MOUTH EVERY DAY   carbidopa-levodopa (SINEMET  IR) 25-100 MG tablet TAKE 1 TABLET THREE TIMES DAILY (AT 7AM, 11AM, 4PM)   Cholecalciferol (QC VITAMIN D3) 50 MCG (2000 UT) TABS Take 2,000 Units by mouth daily with lunch.   citalopram (CELEXA) 20 MG tablet TAKE 1 TABLET EVERY DAY (Patient taking differently: Take 20 mg by mouth daily.)   cyclobenzaprine (FLEXERIL) 5 MG tablet Take 5 mg by mouth 2 (two) times daily.   cyclobenzaprine (FLEXERIL) 5 MG tablet Take by mouth.   divalproex (DEPAKOTE ER) 500 MG 24 hr tablet TAKE 2 TABLETS EVERY DAY (Patient taking differently: Take 500 mg by mouth 2 (two) times daily.)   donepezil (ARICEPT) 10 MG tablet TAKE 1 TABLET EVERY MORNING (Patient taking differently: Take 10 mg by mouth daily.)   famotidine (PEPCID) 10 MG tablet Take 2 tablets (20 mg total) by mouth daily.   furosemide (LASIX) 20 MG tablet Take 20 mg by mouth 2 (two) times daily.   gabapentin (NEURONTIN) 400 MG capsule TAKE 3 CAPSULES AT BEDTIME AND MAY REPEAT DOSE ONE TIME IF NEEDED (Patient taking differently: Take 800 mg by mouth at bedtime.)   glucose blood (ACCU-CHEK AVIVA PLUS) test strip Check sugar 2 times daily. DX E11.8   HYDROcodone-acetaminophen (NORCO) 10-325 MG tablet take 1 tablet by oral route  every 6 hours as needed for pain (DNF 03/28/21)   LORazepam (ATIVAN) 0.5 MG tablet TAKE 1-2 tabs PO BID prn   meloxicam (MOBIC) 15 MG tablet TAKE 1 TABLET EVERY DAY WITH SUPPER   memantine (NAMENDA XR) 7 MG CP24 24 hr capsule TAKE 1 CAPSULE (7 MG TOTAL) BY MOUTH EVERY MORNING.   metFORMIN (GLUCOPHAGE) 500 MG tablet TAKE 1 TABLET TWICE DAILY WITH LUNCH AND WITH SUPPER (PER ADMIN INSTRUCTIONS) (Patient taking differently: Take 500 mg by mouth 2 (two) times daily with a meal.)   mirtazapine (REMERON) 45 MG tablet TAKE 1 TABLET AT BEDTIME (Patient taking differently: Take 45 mg by mouth.)   mupirocin ointment (BACTROBAN) 2 % Apply 1 application topically 2 (two) times daily. (Patient taking differently: Apply 1 application topically 2 (two) times  daily as needed (wound care).)   nitroGLYCERIN (NITROSTAT) 0.4 MG SL tablet Place 0.4 mg under the tongue every 5 (five) minutes x 3 doses as needed for chest pain.   oxyCODONE-acetaminophen (PERCOCET) 5-325 MG tablet Take 1-2 tablets by mouth every 6 (six) hours as needed for severe pain.   simvastatin (ZOCOR) 40 MG tablet TAKE 1 TABLET EVERY DAY AT 6 PM   tamsulosin (FLOMAX) 0.4 MG CAPS capsule TAKE 1 CAPSULE BY MOUTH EVERY DAY (Patient taking differently: Take 0.4 mg by mouth daily after supper.)   vitamin C (ASCORBIC ACID) 500 MG tablet Take 500 mg by mouth daily with lunch.   [DISCONTINUED] sildenafil (VIAGRA) 50 MG tablet Take 1 tablet (50 mg total) by mouth as needed.   No facility-administered encounter medications on file as of 04/03/2021.    Objective:  PHYSICAL EXAMINATION:    VITALS:   Vitals:   04/03/21 1459  BP: 121/82  Pulse: 83  SpO2: 93%  Weight: 168 lb 12.8 oz (76.6 kg)  Height: 6' (1.829 m)    GEN:  The patient appears stated age and is in NAD. HEENT:  Normocephalic, atraumatic.  The mucous membranes are moist. The superficial temporal arteries are without ropiness or tenderness. CV:  RRR Lungs:  CTAB Neck/HEME:  There are no carotid bruits bilaterally.  Neurological examination:  Orientation: The patient is alert and oriented x3. Cranial nerves: There is good facial symmetry with minimal facial hypomimia. The speech is fluent and clear. Soft palate rises symmetrically and there is no tongue deviation. Hearing is intact to conversational tone. Sensation: Sensation is intact to light touch throughout Motor: Strength is at least antigravity x4.  Movement examination: Tone: There is normal tone in the UE/LE Abnormal movements: mild RUE rest tremor that increases with ambulation.  More rare LUE rest tremor Coordination:  There is mild decremation with RAM's, mostly with finger taps on the R Gait and Station: The patient has difficulty arising out of a  deep-seated chair without the use of the hands. The patient's stride length is very short stepped with shuffling .    I have reviewed and interpreted the following labs independently    Chemistry      Component Value Date/Time   NA 135 01/23/2021 0938   NA 141 10/11/2014 1122   K 4.3 01/23/2021 0938   CL 100 01/23/2021 0938   CO2 28 01/23/2021 0938   BUN 13 01/23/2021 0938   BUN 11 10/11/2014 1122   CREATININE 1.19 01/23/2021 0938   CREATININE 0.72 03/26/2013 1608      Component Value Date/Time   CALCIUM 9.2 01/23/2021 0938   ALKPHOS 52 01/23/2021 0938   AST 14 (L) 01/23/2021 0938   ALT 5 01/23/2021 0938   BILITOT 0.6 01/23/2021 0938   BILITOT 0.4 10/11/2014 1122       Lab Results  Component Value Date   WBC 6.1 01/23/2021   HGB 12.5 (L) 01/23/2021   HCT 38.0 (L) 01/23/2021   MCV 100.3 (H) 01/23/2021   PLT 168 01/23/2021    Lab Results  Component Value Date   TSH 1.216 12/21/2019     Total time spent on today's visit was 20 minutes, including both face-to-face time and nonface-to-face time.  Time included that spent on review of records (prior notes available to me/labs/imaging if pertinent), discussing treatment and goals, answering patient's questions and coordinating care.  Cc:  [name], NP

## 2021-04-03 ENCOUNTER — Other Ambulatory Visit: Payer: Self-pay

## 2021-04-03 ENCOUNTER — Encounter: Payer: Self-pay | Admitting: Neurology

## 2021-04-03 ENCOUNTER — Ambulatory Visit: Payer: Medicare HMO | Admitting: Neurology

## 2021-04-03 VITALS — BP 121/82 | HR 83 | Ht 72.0 in | Wt 168.8 lb

## 2021-04-03 DIAGNOSIS — G20 Parkinson's disease: Secondary | ICD-10-CM | POA: Diagnosis not present

## 2021-04-03 MED ORDER — CARBIDOPA-LEVODOPA 25-100 MG PO TABS: ORAL_TABLET | ORAL | 1 refills | Status: DC

## 2021-04-03 NOTE — Patient Instructions (Signed)
Increase carbidopa/levodopa 25/100, 1.5 tablets at 7am/11am/4pm Would recommend physical therapy.  Let me know if you change your mind.

## 2021-04-05 ENCOUNTER — Other Ambulatory Visit: Payer: Self-pay | Admitting: Internal Medicine

## 2021-04-05 NOTE — Telephone Encounter (Signed)
Requested medication (s) are due for refill today: yes  Requested medication (s) are on the active medication list: yes  Last refill:  10/25/20 #90 1 refill  Future visit scheduled: no  Notes to clinic:  patient seen x 1 for back pain by Avie Echevaria, NP, no appt noted to establish care at [facility]. Do you want to refill Rx?     Requested Prescriptions  Pending Prescriptions Disp Refills   citalopram (CELEXA) 20 MG tablet [Pharmacy Med Name: CITALOPRAM HYDROBROMIDE 20 MG Tablet] 90 tablet 1    Sig: TAKE 1 TABLET EVERY DAY     There is no refill protocol information for this order

## 2021-04-09 DIAGNOSIS — M549 Dorsalgia, unspecified: Secondary | ICD-10-CM | POA: Diagnosis not present

## 2021-04-11 DIAGNOSIS — M5136 Other intervertebral disc degeneration, lumbar region: Secondary | ICD-10-CM | POA: Diagnosis not present

## 2021-04-11 DIAGNOSIS — M4807 Spinal stenosis, lumbosacral region: Secondary | ICD-10-CM | POA: Diagnosis not present

## 2021-04-11 DIAGNOSIS — M5416 Radiculopathy, lumbar region: Secondary | ICD-10-CM | POA: Diagnosis not present

## 2021-04-11 DIAGNOSIS — G20 Parkinson's disease: Secondary | ICD-10-CM | POA: Diagnosis not present

## 2021-04-11 DIAGNOSIS — I10 Essential (primary) hypertension: Secondary | ICD-10-CM | POA: Diagnosis not present

## 2021-04-11 DIAGNOSIS — M454 Ankylosing spondylitis of thoracic region: Secondary | ICD-10-CM | POA: Diagnosis not present

## 2021-04-11 DIAGNOSIS — G8929 Other chronic pain: Secondary | ICD-10-CM | POA: Diagnosis not present

## 2021-04-11 DIAGNOSIS — J449 Chronic obstructive pulmonary disease, unspecified: Secondary | ICD-10-CM | POA: Diagnosis not present

## 2021-04-11 DIAGNOSIS — M47814 Spondylosis without myelopathy or radiculopathy, thoracic region: Secondary | ICD-10-CM | POA: Diagnosis not present

## 2021-04-16 DIAGNOSIS — M5136 Other intervertebral disc degeneration, lumbar region: Secondary | ICD-10-CM | POA: Diagnosis not present

## 2021-04-16 DIAGNOSIS — M5416 Radiculopathy, lumbar region: Secondary | ICD-10-CM | POA: Diagnosis not present

## 2021-04-16 DIAGNOSIS — M47814 Spondylosis without myelopathy or radiculopathy, thoracic region: Secondary | ICD-10-CM | POA: Diagnosis not present

## 2021-04-16 DIAGNOSIS — G20 Parkinson's disease: Secondary | ICD-10-CM | POA: Diagnosis not present

## 2021-04-16 DIAGNOSIS — I10 Essential (primary) hypertension: Secondary | ICD-10-CM | POA: Diagnosis not present

## 2021-04-16 DIAGNOSIS — M4807 Spinal stenosis, lumbosacral region: Secondary | ICD-10-CM | POA: Diagnosis not present

## 2021-04-16 DIAGNOSIS — J449 Chronic obstructive pulmonary disease, unspecified: Secondary | ICD-10-CM | POA: Diagnosis not present

## 2021-04-16 DIAGNOSIS — G8929 Other chronic pain: Secondary | ICD-10-CM | POA: Diagnosis not present

## 2021-04-16 DIAGNOSIS — M454 Ankylosing spondylitis of thoracic region: Secondary | ICD-10-CM | POA: Diagnosis not present

## 2021-04-19 ENCOUNTER — Other Ambulatory Visit: Payer: Self-pay | Admitting: Internal Medicine

## 2021-04-19 NOTE — Telephone Encounter (Signed)
Requested medication (s) are due for refill today: Yes  Requested medication (s) are on the active medication list: Yes  Last refill:  03/14/21  Future visit scheduled: No  Notes to clinic:  Unable to refill per protocol, cannot delegate.      Requested Prescriptions  Pending Prescriptions Disp Refills   LORazepam (ATIVAN) 0.5 MG tablet [Pharmacy Med Name: LORAZEPAM 0.5 MG TABLET] 45 tablet 0    Sig: TAKE 1 TO 2 TABLETS BY MOUTH TWICE A DAY AS NEEDED     Not Delegated - Psychiatry:  Anxiolytics/Hypnotics Failed - 04/19/2021 12:50 PM      Failed - This refill cannot be delegated      Failed - Urine Drug Screen completed in last 360 days      Passed - Valid encounter within last 6 months    Recent Outpatient Visits           3 months ago Sacral pain   [facility] [place], Coralie Keens, NP

## 2021-04-24 DIAGNOSIS — G20 Parkinson's disease: Secondary | ICD-10-CM | POA: Diagnosis not present

## 2021-04-24 DIAGNOSIS — I10 Essential (primary) hypertension: Secondary | ICD-10-CM | POA: Diagnosis not present

## 2021-04-24 DIAGNOSIS — M5416 Radiculopathy, lumbar region: Secondary | ICD-10-CM | POA: Diagnosis not present

## 2021-04-24 DIAGNOSIS — M5442 Lumbago with sciatica, left side: Secondary | ICD-10-CM | POA: Diagnosis not present

## 2021-04-25 DIAGNOSIS — J449 Chronic obstructive pulmonary disease, unspecified: Secondary | ICD-10-CM | POA: Diagnosis not present

## 2021-04-26 ENCOUNTER — Other Ambulatory Visit: Payer: Self-pay | Admitting: Internal Medicine

## 2021-04-27 NOTE — Telephone Encounter (Signed)
Requested medications are due for refill today, signature different (mail order)  Requested medications are on the active medication list signature is different  Last refill 02/19/21  Last visit 4/8  Future visit scheduled no  Notes to clinic signature different, same dose but directions more specific on current med list.

## 2021-05-01 DIAGNOSIS — M5416 Radiculopathy, lumbar region: Secondary | ICD-10-CM | POA: Diagnosis not present

## 2021-05-01 DIAGNOSIS — G8929 Other chronic pain: Secondary | ICD-10-CM | POA: Diagnosis not present

## 2021-05-01 DIAGNOSIS — M47814 Spondylosis without myelopathy or radiculopathy, thoracic region: Secondary | ICD-10-CM | POA: Diagnosis not present

## 2021-05-01 DIAGNOSIS — M4807 Spinal stenosis, lumbosacral region: Secondary | ICD-10-CM | POA: Diagnosis not present

## 2021-05-01 DIAGNOSIS — I10 Essential (primary) hypertension: Secondary | ICD-10-CM | POA: Diagnosis not present

## 2021-05-01 DIAGNOSIS — G20 Parkinson's disease: Secondary | ICD-10-CM | POA: Diagnosis not present

## 2021-05-01 DIAGNOSIS — M454 Ankylosing spondylitis of thoracic region: Secondary | ICD-10-CM | POA: Diagnosis not present

## 2021-05-01 DIAGNOSIS — J449 Chronic obstructive pulmonary disease, unspecified: Secondary | ICD-10-CM | POA: Diagnosis not present

## 2021-05-01 DIAGNOSIS — M5136 Other intervertebral disc degeneration, lumbar region: Secondary | ICD-10-CM | POA: Diagnosis not present

## 2021-05-03 ENCOUNTER — Ambulatory Visit: Payer: Self-pay

## 2021-05-03 DIAGNOSIS — G8929 Other chronic pain: Secondary | ICD-10-CM | POA: Diagnosis not present

## 2021-05-03 DIAGNOSIS — M454 Ankylosing spondylitis of thoracic region: Secondary | ICD-10-CM | POA: Diagnosis not present

## 2021-05-03 DIAGNOSIS — M47814 Spondylosis without myelopathy or radiculopathy, thoracic region: Secondary | ICD-10-CM | POA: Diagnosis not present

## 2021-05-03 DIAGNOSIS — M5416 Radiculopathy, lumbar region: Secondary | ICD-10-CM | POA: Diagnosis not present

## 2021-05-03 DIAGNOSIS — M5136 Other intervertebral disc degeneration, lumbar region: Secondary | ICD-10-CM | POA: Diagnosis not present

## 2021-05-03 DIAGNOSIS — G20 Parkinson's disease: Secondary | ICD-10-CM | POA: Diagnosis not present

## 2021-05-03 DIAGNOSIS — M4807 Spinal stenosis, lumbosacral region: Secondary | ICD-10-CM | POA: Diagnosis not present

## 2021-05-03 DIAGNOSIS — I10 Essential (primary) hypertension: Secondary | ICD-10-CM | POA: Diagnosis not present

## 2021-05-03 DIAGNOSIS — J449 Chronic obstructive pulmonary disease, unspecified: Secondary | ICD-10-CM | POA: Diagnosis not present

## 2021-05-03 NOTE — Telephone Encounter (Signed)
Patient called on cell phone, no answer, no mailbox set up. Called on home phone, left VM to return the call to the office to speak to a nurse about his symptoms. Unable to reach patient after 3 attempts by [facility] NT, routing to the provider for resolution per protocol. See notes below for the reason patient is being called.

## 2021-05-03 NOTE — Telephone Encounter (Signed)
Mel, PT with [facility] called to report findings with the patient. He says the patient reported to him that he's not urinating as much with a little burning and being lightheaded, dizzy all day today. Patient's BP 112/74, no fever. Mel says no PT exercises were done today. He says he advised the patient to increase fluid intake, more water. I advised I will call the patient. Patient called, left VM to return the call to the office to speak to a nurse. Will attempt to call patient back later today.

## 2021-05-04 ENCOUNTER — Ambulatory Visit: Payer: Self-pay

## 2021-05-04 NOTE — Telephone Encounter (Signed)
Daughter Alexander Booth called to let you know the pt is no longer having the urinary issue as reported by [facility]. Pt no longer having lightheadedness.  She reports pt is doing fine.   Daughter just wants PCP aware pt. Is better.  Answer Assessment - Initial Assessment Questions 1. REASON FOR CALL or QUESTION: "What is your reason for calling today?" or "How can I best help you?" or "What question do you have that I can help answer?"     Daughter sent message to PCP 2. CALLER: Document the source of call. (e.g., laboratory, patient).     Pt.'s daughter  Protocols used: PCP Call - No Triage-A-AH

## 2021-05-06 NOTE — Telephone Encounter (Signed)
noted 

## 2021-05-07 DIAGNOSIS — G8929 Other chronic pain: Secondary | ICD-10-CM | POA: Diagnosis not present

## 2021-05-07 DIAGNOSIS — J449 Chronic obstructive pulmonary disease, unspecified: Secondary | ICD-10-CM | POA: Diagnosis not present

## 2021-05-07 DIAGNOSIS — M5416 Radiculopathy, lumbar region: Secondary | ICD-10-CM | POA: Diagnosis not present

## 2021-05-07 DIAGNOSIS — M5136 Other intervertebral disc degeneration, lumbar region: Secondary | ICD-10-CM | POA: Diagnosis not present

## 2021-05-07 DIAGNOSIS — M454 Ankylosing spondylitis of thoracic region: Secondary | ICD-10-CM | POA: Diagnosis not present

## 2021-05-07 DIAGNOSIS — I10 Essential (primary) hypertension: Secondary | ICD-10-CM | POA: Diagnosis not present

## 2021-05-07 DIAGNOSIS — M4807 Spinal stenosis, lumbosacral region: Secondary | ICD-10-CM | POA: Diagnosis not present

## 2021-05-07 DIAGNOSIS — G20 Parkinson's disease: Secondary | ICD-10-CM | POA: Diagnosis not present

## 2021-05-07 DIAGNOSIS — M47814 Spondylosis without myelopathy or radiculopathy, thoracic region: Secondary | ICD-10-CM | POA: Diagnosis not present

## 2021-05-08 DIAGNOSIS — L905 Scar conditions and fibrosis of skin: Secondary | ICD-10-CM | POA: Diagnosis not present

## 2021-05-08 DIAGNOSIS — C44629 Squamous cell carcinoma of skin of left upper limb, including shoulder: Secondary | ICD-10-CM | POA: Diagnosis not present

## 2021-05-08 DIAGNOSIS — L570 Actinic keratosis: Secondary | ICD-10-CM | POA: Diagnosis not present

## 2021-05-14 ENCOUNTER — Other Ambulatory Visit: Payer: Self-pay | Admitting: Internal Medicine

## 2021-05-14 DIAGNOSIS — G4700 Insomnia, unspecified: Secondary | ICD-10-CM

## 2021-05-14 DIAGNOSIS — F03918 Unspecified dementia, unspecified severity, with other behavioral disturbance: Secondary | ICD-10-CM

## 2021-05-14 NOTE — Telephone Encounter (Signed)
Requested Prescriptions  Pending Prescriptions Disp Refills  . memantine (NAMENDA XR) 7 MG CP24 24 hr capsule [Pharmacy Med Name: MEMANTINE HCL ER 7 MG CAPSULE] 90 capsule 1    Sig: TAKE 1 CAPSULE (7 MG TOTAL) BY MOUTH EVERY MORNING.     Neurology:  Alzheimer's Agents Passed - 05/14/2021  1:29 PM      Passed - Valid encounter within last 6 months    Recent Outpatient Visits          4 months ago Sacral pain   [facility] [place], Coralie Keens, [place]

## 2021-05-15 ENCOUNTER — Other Ambulatory Visit: Payer: Self-pay | Admitting: Neurology

## 2021-05-15 DIAGNOSIS — M454 Ankylosing spondylitis of thoracic region: Secondary | ICD-10-CM | POA: Diagnosis not present

## 2021-05-15 DIAGNOSIS — J449 Chronic obstructive pulmonary disease, unspecified: Secondary | ICD-10-CM | POA: Diagnosis not present

## 2021-05-15 DIAGNOSIS — I10 Essential (primary) hypertension: Secondary | ICD-10-CM | POA: Diagnosis not present

## 2021-05-15 DIAGNOSIS — G8929 Other chronic pain: Secondary | ICD-10-CM | POA: Diagnosis not present

## 2021-05-15 DIAGNOSIS — M4807 Spinal stenosis, lumbosacral region: Secondary | ICD-10-CM | POA: Diagnosis not present

## 2021-05-15 DIAGNOSIS — M5416 Radiculopathy, lumbar region: Secondary | ICD-10-CM | POA: Diagnosis not present

## 2021-05-15 DIAGNOSIS — M5136 Other intervertebral disc degeneration, lumbar region: Secondary | ICD-10-CM | POA: Diagnosis not present

## 2021-05-15 DIAGNOSIS — M47814 Spondylosis without myelopathy or radiculopathy, thoracic region: Secondary | ICD-10-CM | POA: Diagnosis not present

## 2021-05-15 DIAGNOSIS — G20 Parkinson's disease: Secondary | ICD-10-CM | POA: Diagnosis not present

## 2021-05-23 DIAGNOSIS — I10 Essential (primary) hypertension: Secondary | ICD-10-CM | POA: Diagnosis not present

## 2021-05-23 DIAGNOSIS — J449 Chronic obstructive pulmonary disease, unspecified: Secondary | ICD-10-CM | POA: Diagnosis not present

## 2021-05-23 DIAGNOSIS — G20 Parkinson's disease: Secondary | ICD-10-CM | POA: Diagnosis not present

## 2021-05-23 DIAGNOSIS — M454 Ankylosing spondylitis of thoracic region: Secondary | ICD-10-CM | POA: Diagnosis not present

## 2021-05-23 DIAGNOSIS — M4807 Spinal stenosis, lumbosacral region: Secondary | ICD-10-CM | POA: Diagnosis not present

## 2021-05-23 DIAGNOSIS — M47814 Spondylosis without myelopathy or radiculopathy, thoracic region: Secondary | ICD-10-CM | POA: Diagnosis not present

## 2021-05-23 DIAGNOSIS — M5416 Radiculopathy, lumbar region: Secondary | ICD-10-CM | POA: Diagnosis not present

## 2021-05-23 DIAGNOSIS — G8929 Other chronic pain: Secondary | ICD-10-CM | POA: Diagnosis not present

## 2021-05-23 DIAGNOSIS — M5136 Other intervertebral disc degeneration, lumbar region: Secondary | ICD-10-CM | POA: Diagnosis not present

## 2021-05-25 ENCOUNTER — Other Ambulatory Visit: Payer: Self-pay | Admitting: Internal Medicine

## 2021-05-26 DIAGNOSIS — J449 Chronic obstructive pulmonary disease, unspecified: Secondary | ICD-10-CM | POA: Diagnosis not present

## 2021-05-26 NOTE — Telephone Encounter (Signed)
Requested medications are due for refill today yes  Requested medications are on the active medication list yes  Last refill 04/20/21  Last visit ? 01/12/21, do not see it mentioned but that is latest visit  Future visit scheduled no  Notes to clinic This medication can not be delegated, UDS over 360 days ago, please assess.   Requested Prescriptions  Pending Prescriptions Disp Refills   LORazepam (ATIVAN) 0.5 MG tablet [Pharmacy Med Name: LORAZEPAM 0.5 MG TABLET] 45 tablet 0    Sig: TAKE 1 TO 2 TABLETS BY MOUTH TWICE A DAY AS NEEDED     Not Delegated - Psychiatry:  Anxiolytics/Hypnotics Failed - 05/25/2021  5:04 PM      Failed - This refill cannot be delegated      Failed - Urine Drug Screen completed in last 360 days      Passed - Valid encounter within last 6 months    Recent Outpatient Visits           4 months ago Sacral pain   [facility] [place], Coralie Keens, NP

## 2021-05-28 DIAGNOSIS — G20 Parkinson's disease: Secondary | ICD-10-CM | POA: Diagnosis not present

## 2021-05-28 DIAGNOSIS — M47814 Spondylosis without myelopathy or radiculopathy, thoracic region: Secondary | ICD-10-CM | POA: Diagnosis not present

## 2021-05-28 DIAGNOSIS — M5416 Radiculopathy, lumbar region: Secondary | ICD-10-CM | POA: Diagnosis not present

## 2021-05-28 DIAGNOSIS — M454 Ankylosing spondylitis of thoracic region: Secondary | ICD-10-CM | POA: Diagnosis not present

## 2021-05-28 DIAGNOSIS — G8929 Other chronic pain: Secondary | ICD-10-CM | POA: Diagnosis not present

## 2021-05-28 DIAGNOSIS — M4807 Spinal stenosis, lumbosacral region: Secondary | ICD-10-CM | POA: Diagnosis not present

## 2021-05-28 DIAGNOSIS — J449 Chronic obstructive pulmonary disease, unspecified: Secondary | ICD-10-CM | POA: Diagnosis not present

## 2021-05-28 DIAGNOSIS — M5136 Other intervertebral disc degeneration, lumbar region: Secondary | ICD-10-CM | POA: Diagnosis not present

## 2021-05-28 DIAGNOSIS — I10 Essential (primary) hypertension: Secondary | ICD-10-CM | POA: Diagnosis not present

## 2021-05-31 DIAGNOSIS — J449 Chronic obstructive pulmonary disease, unspecified: Secondary | ICD-10-CM | POA: Diagnosis not present

## 2021-05-31 DIAGNOSIS — G20 Parkinson's disease: Secondary | ICD-10-CM | POA: Diagnosis not present

## 2021-05-31 DIAGNOSIS — G8929 Other chronic pain: Secondary | ICD-10-CM | POA: Diagnosis not present

## 2021-05-31 DIAGNOSIS — M5416 Radiculopathy, lumbar region: Secondary | ICD-10-CM | POA: Diagnosis not present

## 2021-05-31 DIAGNOSIS — M4807 Spinal stenosis, lumbosacral region: Secondary | ICD-10-CM | POA: Diagnosis not present

## 2021-05-31 DIAGNOSIS — I10 Essential (primary) hypertension: Secondary | ICD-10-CM | POA: Diagnosis not present

## 2021-05-31 DIAGNOSIS — M5136 Other intervertebral disc degeneration, lumbar region: Secondary | ICD-10-CM | POA: Diagnosis not present

## 2021-05-31 DIAGNOSIS — M47814 Spondylosis without myelopathy or radiculopathy, thoracic region: Secondary | ICD-10-CM | POA: Diagnosis not present

## 2021-05-31 DIAGNOSIS — M454 Ankylosing spondylitis of thoracic region: Secondary | ICD-10-CM | POA: Diagnosis not present

## 2021-06-04 DIAGNOSIS — G8929 Other chronic pain: Secondary | ICD-10-CM | POA: Diagnosis not present

## 2021-06-04 DIAGNOSIS — I10 Essential (primary) hypertension: Secondary | ICD-10-CM | POA: Diagnosis not present

## 2021-06-04 DIAGNOSIS — M5416 Radiculopathy, lumbar region: Secondary | ICD-10-CM | POA: Diagnosis not present

## 2021-06-04 DIAGNOSIS — J449 Chronic obstructive pulmonary disease, unspecified: Secondary | ICD-10-CM | POA: Diagnosis not present

## 2021-06-04 DIAGNOSIS — G20 Parkinson's disease: Secondary | ICD-10-CM | POA: Diagnosis not present

## 2021-06-04 DIAGNOSIS — M5136 Other intervertebral disc degeneration, lumbar region: Secondary | ICD-10-CM | POA: Diagnosis not present

## 2021-06-04 DIAGNOSIS — M47814 Spondylosis without myelopathy or radiculopathy, thoracic region: Secondary | ICD-10-CM | POA: Diagnosis not present

## 2021-06-04 DIAGNOSIS — M454 Ankylosing spondylitis of thoracic region: Secondary | ICD-10-CM | POA: Diagnosis not present

## 2021-06-04 DIAGNOSIS — M4807 Spinal stenosis, lumbosacral region: Secondary | ICD-10-CM | POA: Diagnosis not present

## 2021-06-06 ENCOUNTER — Other Ambulatory Visit: Payer: Self-pay

## 2021-06-06 ENCOUNTER — Ambulatory Visit: Payer: Self-pay | Admitting: *Deleted

## 2021-06-06 DIAGNOSIS — G8929 Other chronic pain: Secondary | ICD-10-CM | POA: Diagnosis not present

## 2021-06-06 DIAGNOSIS — G20 Parkinson's disease: Secondary | ICD-10-CM | POA: Diagnosis not present

## 2021-06-06 DIAGNOSIS — M4807 Spinal stenosis, lumbosacral region: Secondary | ICD-10-CM | POA: Diagnosis not present

## 2021-06-06 DIAGNOSIS — I10 Essential (primary) hypertension: Secondary | ICD-10-CM | POA: Diagnosis not present

## 2021-06-06 DIAGNOSIS — J449 Chronic obstructive pulmonary disease, unspecified: Secondary | ICD-10-CM | POA: Diagnosis not present

## 2021-06-06 DIAGNOSIS — M5136 Other intervertebral disc degeneration, lumbar region: Secondary | ICD-10-CM | POA: Diagnosis not present

## 2021-06-06 DIAGNOSIS — M5416 Radiculopathy, lumbar region: Secondary | ICD-10-CM | POA: Diagnosis not present

## 2021-06-06 DIAGNOSIS — M47814 Spondylosis without myelopathy or radiculopathy, thoracic region: Secondary | ICD-10-CM | POA: Diagnosis not present

## 2021-06-06 DIAGNOSIS — M454 Ankylosing spondylitis of thoracic region: Secondary | ICD-10-CM | POA: Diagnosis not present

## 2021-06-06 MED ORDER — ACCU-CHEK GUIDE VI STRP: ORAL_STRIP | 12 refills | Status: AC

## 2021-06-06 MED ORDER — ACCU-CHEK SOFTCLIX LANCETS MISC: 3 refills | Status: DC

## 2021-06-06 MED ORDER — ACCU-CHEK GUIDE ME W/DEVICE KIT: 1.0000 | PACK | 0 refills | Status: DC

## 2021-06-06 NOTE — Telephone Encounter (Signed)
Mel- [facility] is calling to report concerns about drop in patient BP- orthostatic. Patient BP when sitting is normal- but when standing drops low. Patient gets dizzy with ambulation and states he is weak. Patient is not hydrating well- he will try to hydrate better. Patient wants to know if he needs to stop any of his BP medications- will send that question to PCP. Patient does not normally monitor BP- will start keeping record. Advised ED if drops systolic 90- it has been close and symptoms. Patient advised UC if he gets low reading while sitting. Fall risk discussed. Will send message to PCP for patient direction on medication- he is aware he may not hear back tonight.

## 2021-06-06 NOTE — Telephone Encounter (Signed)
Reason for Disposition . [3] Systolic BP 01-314 AND [3] taking blood pressure medications AND [3] dizzy, lightheaded or weak  Answer Assessment - Initial Assessment Questions 1. BLOOD PRESSURE: "What is the blood pressure?" "Did you take at least two measurements 5 minutes apart?"     Monday- 98/60 after activity, Today 95/55 resting to standing, 124/72- sitting- orthostatic drop present when standing 2. ONSET: "When did you take your blood pressure?"     4:00, 4:15 3. HOW: "How did you obtain the blood pressure?" (e.g., visiting nurse, automatic home BP monitor)     manual 4. HISTORY: "Do you have a history of low blood pressure?" "What is your blood pressure normally?"     During PT care- no history-122/66,108/60,146/72- new this week 5. MEDICATIONS: "Are you taking any medications for blood pressure?" If Yes, ask: "Have they been changed recently?"     No new start of medication- patient is not hydrating well- taking HCTZ 6. PULSE RATE: "Do you know what your pulse rate is?"      74,70 today-70 7. OTHER SYMPTOMS: "Have you been sick recently?" "Have you had a recent injury?"     No, dizziness, lightheaded when ambulating  8. PREGNANCY: "Is there any chance you are pregnant?" "When was your last menstrual period?"     no  Protocols used: Blood Pressure - Low-A-AH

## 2021-06-08 NOTE — Telephone Encounter (Signed)
Have him monitor BP for me an update me with readings on Monday.  I can always do a home visit for him if needed.

## 2021-06-12 NOTE — Telephone Encounter (Signed)
The pt was instructed to monitor his blood pressure. He verbalize understanding and will call back on Monday morning with his readings.

## 2021-06-18 ENCOUNTER — Other Ambulatory Visit: Payer: Self-pay | Admitting: Internal Medicine

## 2021-06-18 DIAGNOSIS — G20 Parkinson's disease: Secondary | ICD-10-CM | POA: Diagnosis not present

## 2021-06-18 DIAGNOSIS — M5442 Lumbago with sciatica, left side: Secondary | ICD-10-CM | POA: Diagnosis not present

## 2021-06-18 DIAGNOSIS — M5416 Radiculopathy, lumbar region: Secondary | ICD-10-CM | POA: Diagnosis not present

## 2021-06-18 DIAGNOSIS — M5136 Other intervertebral disc degeneration, lumbar region: Secondary | ICD-10-CM | POA: Diagnosis not present

## 2021-06-18 NOTE — Telephone Encounter (Signed)
Requested medications are due for refill today yes  Requested medications are on the active medication list yes  Last visit 01/12/21  Future visit scheduled No  Notes to clinic Has already had a curtesy refill and there is no upcoming appointment scheduled. Requested Prescriptions  Pending Prescriptions Disp Refills   furosemide (LASIX) 20 MG tablet [Pharmacy Med Name: FUROSEMIDE 20 MG Tablet] 120 tablet     Sig: TAKE 1 TABLET TWICE DAILY WITH BREAKFAST AND LUNCH (NEED TO SCHEDULE 6 MONTH FOLLOW UP)     Cardiovascular:  Diuretics - Loop Passed - 06/18/2021  3:37 AM      Passed - K in normal range and within 360 days    Potassium  Date Value Ref Range Status  01/23/2021 4.3 3.5 - 5.1 mmol/L Final          Passed - Ca in normal range and within 360 days    Calcium  Date Value Ref Range Status  01/23/2021 9.2 8.9 - 10.3 mg/dL Final          Passed - Na in normal range and within 360 days    Sodium  Date Value Ref Range Status  01/23/2021 135 135 - 145 mmol/L Final  10/11/2014 141 134 - 144 mmol/L Final          Passed - Cr in normal range and within 360 days    Creat  Date Value Ref Range Status  03/26/2013 0.72 0.50 - 1.35 mg/dL Final   Creatinine, Ser  Date Value Ref Range Status  01/23/2021 1.19 0.61 - 1.24 mg/dL Final   Creatinine,U  Date Value Ref Range Status  03/20/2017 115.3 mg/dL Final   Creatinine, Urine  Date Value Ref Range Status  10/27/2020 29 20 - 320 mg/dL Final          Passed - Last BP in normal range    BP Readings from Last 1 Encounters:  04/03/21 121/82          Passed - Valid encounter within last 6 months    Recent Outpatient Visits           5 months ago Sacral pain   [facility] [place], Coralie Keens, NP

## 2021-06-19 NOTE — Telephone Encounter (Signed)
I attempted to contact the patient, no answer. I sent the patient a mychart message with [name] recommendation.

## 2021-06-19 NOTE — Telephone Encounter (Signed)
Pt needs visit in December. I will do a home visit on a Wednesday afternoon about 3 pm. Let me know which Wed in Dec works best for him.

## 2021-06-22 ENCOUNTER — Encounter: Payer: Self-pay | Admitting: Internal Medicine

## 2021-06-22 ENCOUNTER — Ambulatory Visit: Payer: Medicare HMO | Admitting: Internal Medicine

## 2021-06-22 VITALS — BP 130/70 | HR 75 | Resp 20 | Wt 170.3 lb

## 2021-06-22 DIAGNOSIS — I700 Atherosclerosis of aorta: Secondary | ICD-10-CM | POA: Diagnosis not present

## 2021-06-22 DIAGNOSIS — N400 Enlarged prostate without lower urinary tract symptoms: Secondary | ICD-10-CM

## 2021-06-22 DIAGNOSIS — E785 Hyperlipidemia, unspecified: Secondary | ICD-10-CM | POA: Insufficient documentation

## 2021-06-22 DIAGNOSIS — E781 Pure hyperglyceridemia: Secondary | ICD-10-CM

## 2021-06-22 DIAGNOSIS — J449 Chronic obstructive pulmonary disease, unspecified: Secondary | ICD-10-CM | POA: Diagnosis not present

## 2021-06-22 DIAGNOSIS — G20 Parkinson's disease: Secondary | ICD-10-CM

## 2021-06-22 DIAGNOSIS — K219 Gastro-esophageal reflux disease without esophagitis: Secondary | ICD-10-CM | POA: Diagnosis not present

## 2021-06-22 DIAGNOSIS — G63 Polyneuropathy in diseases classified elsewhere: Secondary | ICD-10-CM

## 2021-06-22 DIAGNOSIS — F339 Major depressive disorder, recurrent, unspecified: Secondary | ICD-10-CM

## 2021-06-22 DIAGNOSIS — G8929 Other chronic pain: Secondary | ICD-10-CM

## 2021-06-22 DIAGNOSIS — I10 Essential (primary) hypertension: Secondary | ICD-10-CM

## 2021-06-22 DIAGNOSIS — G479 Sleep disorder, unspecified: Secondary | ICD-10-CM

## 2021-06-22 DIAGNOSIS — F03918 Unspecified dementia, unspecified severity, with other behavioral disturbance: Secondary | ICD-10-CM

## 2021-06-22 DIAGNOSIS — M549 Dorsalgia, unspecified: Secondary | ICD-10-CM

## 2021-06-22 DIAGNOSIS — M5416 Radiculopathy, lumbar region: Secondary | ICD-10-CM

## 2021-06-22 DIAGNOSIS — E118 Type 2 diabetes mellitus with unspecified complications: Secondary | ICD-10-CM

## 2021-06-22 DIAGNOSIS — M48061 Spinal stenosis, lumbar region without neurogenic claudication: Secondary | ICD-10-CM

## 2021-06-22 MED ORDER — TRAZODONE HCL 50 MG PO TABS: 25.0000 mg | ORAL_TABLET | Freq: Every evening | ORAL | 0 refills | Status: DC | PRN

## 2021-06-22 NOTE — Assessment & Plan Note (Signed)
-   Continue Flomax 

## 2021-06-22 NOTE — Assessment & Plan Note (Signed)
Managed with meloxicam, gabapentin, cyclobenzaprine and Percocet prescribed by pain management

## 2021-06-22 NOTE — Assessment & Plan Note (Signed)
Try to avoid foods that trigger the reflux Continue famotidine as needed

## 2021-06-22 NOTE — Assessment & Plan Note (Signed)
Continue Sinemet ?

## 2021-06-22 NOTE — Assessment & Plan Note (Signed)
Continue Breo and albuterol Encourage smoking cessation

## 2021-06-22 NOTE — Assessment & Plan Note (Signed)
We will check A1c and urine microalbumin at next visit Encourage low-carb diet Continue metformin Encourage routine eye exam Encourage routine foot exam Immunizations UTD

## 2021-06-22 NOTE — Assessment & Plan Note (Signed)
We will check lipid panel at next visit Encourage low-fat diet Continue atorvastatin

## 2021-06-22 NOTE — Assessment & Plan Note (Signed)
Stable cognitive and functional needs Continue Depakote, donepezil, memantine, citalopram, lorazepam and mirtazapine Will monitor

## 2021-06-22 NOTE — Assessment & Plan Note (Signed)
Controlled off meds He notes intermittent dizziness but this has improved If worsens would consider fludrocortisone for orthostasis

## 2021-06-22 NOTE — Assessment & Plan Note (Signed)
Continue Depakote, citalopram and lorazepam We not indicated Support offered

## 2021-06-22 NOTE — Assessment & Plan Note (Signed)
We will trial trazodone

## 2021-06-22 NOTE — Assessment & Plan Note (Signed)
Continue simvastatin. 

## 2021-06-22 NOTE — Progress Notes (Signed)
Subjective:    Patient ID: Alexander Booth, male    DOB: [birth year], 75 y.o.   MRN: [id number]  HPI  Patient is seen in his home for routine follow-up. Reviewed with his daughter [name] who is his main caregiver.  She reports he has been complaining of intermittent dizziness when he stands up.  Physical therapy noted that he was orthostatic at 1 point so his Furosemide was D/C'd.  He reports intermittent dizziness at times but does report overall this is better.  His daughter also reports recent back surgery for nerve ablation but he reports that this did not improve his pain at all.  He reports he is not sleeping well despite Lorazepam, Percocet, Gabapentin and Mirtazapine.  He is able to fall asleep but he is unable to stay asleep.  There is no sleep study on file.  He reports his appetite varies, some days it is good and some days it is not.  His weight is stable however.  He does have an intermittent cough and shortness of breath, related to his COPD but he does not feel like this is worse.  He denies chest pain.  He reports his bowels are okay and he denies any urinary difficulties.  He reports his mood is good.  HTN: His BP today is 130/70.  He is not taking any antihypertensive medication at this time.  ECG from 01/2021 reviewed.  COPD: He reports chronic cough and shortness of breath.  He is using Breo and Albuterol as prescribed.  There are no PFTs on file.  GERD: He reports intermittent breakthrough on Famotidine.  He does not take any antacids OTC.  There is no upper GI on file.  Dementia with Behavioral Disturbance: Chronic, managed on Depakote, Donepezil, Memantine, Citalopram, Lorazepam and Mirtazapine.  He is not currently seeing a therapist.  DM2: His last A1c was 7%, 01/2021.  He is taking Metformin as prescribed.  He does not check his sugars.  He checks his feet routinely.  Flu 03/2021.  Pneumovax 04/2018.  Prevnar: 09/2015 [place] x3.    Chronic Back Pain with  Neuropathy: Status post recent nerve ablation that was not helpful.  He reports his biggest issue is his pain in his back and his legs.  He is taking Percocet, Cyclobenzaprine, Meloxicam and Gabapentin.  He follows with pain management.  HLD with Aortic Atherosclerosis: His last LDL was not calculated, triglycerides 527, 10/2020.  He denies myalgias on Simvastatin.  He tries to consume a low-fat diet.  Parkinson's: He reports tremor and stiffness.  He is taking Sinemet as prescribed.  He no longer follows with neurology.  BPH: He voids fine on Flomax.  He no longer follows with urology.  Review of Systems     Past Medical History:  Diagnosis Date   Arthritis    COPD (chronic obstructive pulmonary disease) ([place])    Depression    Dyspnea    Erectile dysfunction    He's had a hx of    Hypercholesterolemia    Hypertension    Essential   Lumbosacral disc disease    with chronic low back pain, has implanted nerve stimulator   Peripheral neuropathy    Presenile dementia    Type 2 diabetes mellitus ([place]) 2010   takes oral meds    Current Outpatient Medications  Medication Sig Dispense Refill   Accu-Chek Softclix Lancets lancets Check sugar 2 times daily. DX E11.8 200 each 3   Albuterol Sulfate (PROAIR RESPICLICK) 789 (90  Base) MCG/ACT AEPB Inhale 1-2 puffs into the lungs every 4 (four) hours as needed. (Patient taking differently: Inhale 1-2 puffs into the lungs every 4 (four) hours as needed (shortness of breath).) 1 each 11   aspirin EC 81 MG tablet Take 81 mg by mouth daily with breakfast.     Blood Glucose Monitoring Suppl (ACCU-CHEK AVIVA PLUS) w/Device KIT Check sugar 2 times daily. DX E11.8 1 kit 12   BREO ELLIPTA 100-25 MCG/INH AEPB INHALE 1 PUFF BY MOUTH EVERY DAY 60 each 2   carbidopa-levodopa (SINEMET IR) 25-100 MG tablet TAKE 1.5 TABLET BY MOUTH THREE TIMES A DAY 315 tablet 0   Cholecalciferol (QC VITAMIN D3) 50 MCG (2000 UT) TABS Take 2,000 Units by mouth daily with lunch.      citalopram (CELEXA) 20 MG tablet TAKE 1 TABLET EVERY DAY 90 tablet 0   cyclobenzaprine (FLEXERIL) 5 MG tablet Take 5 mg by mouth 2 (two) times daily.     divalproex (DEPAKOTE ER) 500 MG 24 hr tablet TAKE 2 TABLETS EVERY DAY (Patient taking differently: Take 500 mg by mouth 2 (two) times daily.) 180 tablet 3   donepezil (ARICEPT) 10 MG tablet TAKE 1 TABLET EVERY MORNING (Patient taking differently: Take 10 mg by mouth daily.) 90 tablet 3   famotidine (PEPCID) 10 MG tablet Take 2 tablets (20 mg total) by mouth daily. 180 tablet 3   gabapentin (NEURONTIN) 400 MG capsule TAKE 3 CAPSULES AT BEDTIME AND MAY REPEAT DOSE ONE TIME IF NEEDED (Patient taking differently: Take 800 mg by mouth at bedtime.) 270 capsule 3   LORazepam (ATIVAN) 0.5 MG tablet TAKE 1 TO 2 TABLETS BY MOUTH TWICE A DAY AS NEEDED 45 tablet 0   meloxicam (MOBIC) 15 MG tablet TAKE 1 TABLET EVERY DAY WITH SUPPER 90 tablet 0   memantine (NAMENDA XR) 7 MG CP24 24 hr capsule TAKE 1 CAPSULE (7 MG TOTAL) BY MOUTH EVERY MORNING. 90 capsule 1   metFORMIN (GLUCOPHAGE) 500 MG tablet TAKE 1 TABLET TWICE DAILY WITH LUNCH AND WITH SUPPER (PER ADMIN INSTRUCTIONS) (Patient taking differently: Take 500 mg by mouth 2 (two) times daily with a meal.) 180 tablet 3   mirtazapine (REMERON) 45 MG tablet TAKE 1 TABLET AT BEDTIME (Patient taking differently: Take 45 mg by mouth.) 90 tablet 3   oxyCODONE-acetaminophen (PERCOCET) 5-325 MG tablet Take 1-2 tablets by mouth every 6 (six) hours as needed for severe pain. 20 tablet 0   simvastatin (ZOCOR) 40 MG tablet TAKE 1 TABLET EVERY DAY AT 6 PM 90 tablet 0   tamsulosin (FLOMAX) 0.4 MG CAPS capsule TAKE 1 CAPSULE BY MOUTH EVERY DAY (Patient taking differently: Take 0.4 mg by mouth daily after supper.) 90 capsule 3   traZODone (DESYREL) 50 MG tablet Take 0.5 tablets (25 mg total) by mouth at bedtime as needed for sleep. 30 tablet 0   vitamin C (ASCORBIC ACID) 500 MG tablet Take 500 mg by mouth daily with lunch.      acetaminophen (TYLENOL) 500 MG tablet Take 1,000 mg by mouth every 6 (six) hours as needed for moderate pain. (Patient not taking: Reported on 06/22/2021)     glucose blood (ACCU-CHEK AVIVA PLUS) test strip Check sugar 2 times daily. DX E11.8 200 each 3   glucose blood (ACCU-CHEK GUIDE) test strip Use as instructed 100 each 12   mupirocin ointment (BACTROBAN) 2 % Apply 1 application topically 2 (two) times daily. (Patient not taking: Reported on 06/22/2021) 22 g 0   nitroGLYCERIN (  NITROSTAT) 0.4 MG SL tablet Place 0.4 mg under the tongue every 5 (five) minutes x 3 doses as needed for chest pain. (Patient not taking: Reported on 06/22/2021)     No current facility-administered medications for this visit.    Allergies  Allergen Reactions   Codeine Hives and Itching    Family History  Problem Relation Age of Onset   Multiple myeloma Mother    Diabetes Mother    Diabetes Sister    Lymphoma Sister    Stroke Neg Hx     Social History   Socioeconomic History   Marital status: Married    Spouse name: Vaughan Basta   Number of children: 3   Years of education: 12   Highest education level: Not on file  Occupational History   Occupation: retired    Comment: retired  Tobacco Use   Smoking status: Every Day    Packs/day: 1.00    Years: 40.00    Pack years: 40.00    Types: Cigarettes   Smokeless tobacco: Never  Vaping Use   Vaping Use: Never used  Substance and Sexual Activity   Alcohol use: No    Alcohol/week: 0.0 standard drinks   Drug use: No   Sexual activity: Not on file  Other Topics Concern   Not on file  Social History Narrative   Patient is right handed   Patient resides with wife,consumes 2-3 cups of caffeine daily   Social Determinants of Health   Financial Resource Strain: Not on file  Food Insecurity: Not on file  Transportation Needs: Not on file  Physical Activity: Not on file  Stress: Not on file  Social Connections: Not on file  Intimate Partner Violence: Not on  file     Constitutional: Denies fever, malaise, fatigue, headache or abrupt weight changes.  HEENT: Denies eye pain, eye redness, ear pain, ringing in the ears, wax buildup, runny nose, nasal congestion, bloody nose, or sore throat. Respiratory: Patient reports intermittent cough and shortness of breath.  Denies difficulty breathing.   Cardiovascular: Denies chest pain, chest tightness, palpitations or swelling in the hands or feet.  Gastrointestinal: Denies abdominal pain, bloating, constipation, diarrhea or blood in the stool.  GU: Denies urgency, frequency, pain with urination, burning sensation, blood in urine, odor or discharge. Musculoskeletal: Patient reports chronic back and leg pain.  Denies decrease in range of motion,  muscle pain or joint swelling.  Skin: Denies redness, rashes, lesions or ulcercations.  Neurological: Patient reports neuropathic pain, difficulty with memory, difficulty with balance.  Denies dizziness, difficulty with speech or problems with coordination.  Psych: Patient has a history of depression.  Denies anxiety, SI/HI.  No other specific complaints in a complete review of systems (except as listed in HPI above).  Objective:   Physical Exam   BP 130/70   Pulse 75   Resp 20   Wt 170 lb 4.8 oz (77.2 kg)   SpO2 97%   BMI 23.10 kg/m  Wt Readings from Last 3 Encounters:  06/22/21 170 lb 4.8 oz (77.2 kg)  04/03/21 168 lb 12.8 oz (76.6 kg)  01/25/21 190 lb (86.2 kg)    General: Appears his stated age, chronically ill-appearing, in NAD. Skin: Warm, dry and intact. No ulcerations noted. HEENT: Head: normal shape and size; Eyes: sclera white and EOMs intact;  Neck:  Neck supple, trachea midline. No masses, lumps or thyromegaly present.  Cardiovascular: Normal rate and rhythm. S1,S2 noted.  No murmur, rubs or gallops noted. No JVD  or BLE edema. No carotid bruits noted. Pulmonary/Chest: Normal effort and positive vesicular breath sounds. No respiratory  distress. No wheezes, rales or ronchi noted.  Abdomen: Soft and nontender. Normal bowel sounds. No distention or masses noted. Liver, spleen and kidneys non palpable. Musculoskeletal: Bradykinesia noted.   Neurological: Alert and oriented.  + Resting tremor. Psychiatric: Mood and affect normal. Behavior is normal. Judgment and thought content normal.     BMET    Component Value Date/Time   NA 135 01/23/2021 0938   NA 141 10/11/2014 1122   K 4.3 01/23/2021 0938   CL 100 01/23/2021 0938   CO2 28 01/23/2021 0938   GLUCOSE 122 (H) 01/23/2021 0938   BUN 13 01/23/2021 0938   BUN 11 10/11/2014 1122   CREATININE 1.19 01/23/2021 0938   CREATININE 0.72 03/26/2013 1608   CALCIUM 9.2 01/23/2021 0938   GFRNONAA >60 01/23/2021 0938   GFRAA >60 12/25/2019 0358    Lipid Panel     Component Value Date/Time   CHOL 221 (H) 10/27/2020 1445   TRIG 527 (H) 10/27/2020 1445   HDL 35 (L) 10/27/2020 1445   CHOLHDL 6.3 (H) 10/27/2020 1445   VLDL 75.6 (H) 04/27/2019 1116   LDLCALC  10/27/2020 1445     Comment:     . LDL cholesterol not calculated. Triglyceride levels greater than 400 mg/dL invalidate calculated LDL results. . Reference range: <100 . Desirable range <100 mg/dL for primary prevention;   <70 mg/dL for patients with CHD or diabetic patients  with > or = 2 CHD risk factors. Marland Kitchen LDL-C is now calculated using the Martin-Hopkins  calculation, which is a validated novel method providing  better accuracy than the Friedewald equation in the  estimation of LDL-C.  Cresenciano Genre et al. Annamaria Helling. 1423;953(20): 2061-2068  (http://education.[employer].com/faq/FAQ164)     CBC    Component Value Date/Time   WBC 6.1 01/23/2021 0938   RBC 3.79 (L) 01/23/2021 0938   HGB 12.5 (L) 01/23/2021 0938   HCT 38.0 (L) 01/23/2021 0938   PLT 168 01/23/2021 0938   MCV 100.3 (H) 01/23/2021 0938   MCH 33.0 01/23/2021 0938   MCHC 32.9 01/23/2021 0938   RDW 11.9 01/23/2021 0938   RDW 13.5 10/11/2014  1122   LYMPHSABS 0.9 12/25/2019 0358   LYMPHSABS 1.3 10/11/2014 1122   MONOABS 0.5 12/25/2019 0358   EOSABS 0.0 12/25/2019 0358   EOSABS 0.0 10/11/2014 1122   BASOSABS 0.0 12/25/2019 0358   BASOSABS 0.0 10/11/2014 1122    Hgb A1C Lab Results  Component Value Date   HGBA1C 7.0 (H) 01/23/2021           Assessment & Plan:     Webb Silversmith, NP This visit occurred during the SARS-CoV-2 public health emergency.  Safety protocols were in place, including screening questions prior to the visit, additional usage of staff PPE, and extensive cleaning of exam room while observing appropriate contact time as indicated for disinfecting solutions.

## 2021-06-25 ENCOUNTER — Other Ambulatory Visit: Payer: Self-pay | Admitting: Internal Medicine

## 2021-06-25 DIAGNOSIS — M5136 Other intervertebral disc degeneration, lumbar region: Secondary | ICD-10-CM | POA: Diagnosis not present

## 2021-06-25 DIAGNOSIS — G20 Parkinson's disease: Secondary | ICD-10-CM | POA: Diagnosis not present

## 2021-06-25 DIAGNOSIS — M5442 Lumbago with sciatica, left side: Secondary | ICD-10-CM | POA: Diagnosis not present

## 2021-06-25 DIAGNOSIS — M5416 Radiculopathy, lumbar region: Secondary | ICD-10-CM | POA: Diagnosis not present

## 2021-06-25 NOTE — Telephone Encounter (Signed)
Requested medication (s) are due for refill today: YES  Requested medication (s) are on the active medication list: YES  Last refill:  05/28/21 #45/0 RF  Future visit scheduled: NO  Notes to clinic: Unable to refill per protocol, cannot delegate.      Requested Prescriptions  Pending Prescriptions Disp Refills   LORazepam (ATIVAN) 0.5 MG tablet [Pharmacy Med Name: LORAZEPAM 0.5 MG TABLET] 45 tablet 0    Sig: TAKE 1 TO 2 TABLETS BY MOUTH TWICE A DAY AS NEEDED     Not Delegated - Psychiatry:  Anxiolytics/Hypnotics Failed - 06/25/2021 10:40 AM      Failed - This refill cannot be delegated      Failed - Urine Drug Screen completed in last 360 days      Passed - Valid encounter within last 6 months    Recent Outpatient Visits           3 days ago Aortic atherosclerosis [facility])   [facility] [place], Coralie Keens, NP   5 months ago Sacral pain   [facility] [place], Coralie Keens, [place]

## 2021-06-28 ENCOUNTER — Other Ambulatory Visit: Payer: Self-pay | Admitting: Internal Medicine

## 2021-06-28 ENCOUNTER — Other Ambulatory Visit: Payer: Self-pay | Admitting: Neurology

## 2021-06-28 DIAGNOSIS — G20 Parkinson's disease: Secondary | ICD-10-CM

## 2021-06-29 ENCOUNTER — Other Ambulatory Visit: Payer: Self-pay

## 2021-06-29 NOTE — Telephone Encounter (Signed)
Requested medication (s) are due for refill today: yes  Requested medication (s) are on the active medication list: yes  Last refill:  02/06/21 Zocor #90/0RF, 03/14/21 meloxicam #90/0RF  Future visit scheduled: no  Notes to clinic:  Unable to refill per protocol, medication not assigned to the refill protocol.      Requested Prescriptions  Pending Prescriptions Disp Refills   meloxicam (MOBIC) 15 MG tablet [Pharmacy Med Name: MELOXICAM 15 MG TABLET] 90 tablet 3    Sig: TAKE 1 TABLET BY MOUTH EVERY DAY     There is no refill protocol information for this order     simvastatin (ZOCOR) 40 MG tablet [Pharmacy Med Name: SIMVASTATIN 40 MG TABLET] 90 tablet 0    Sig: TAKE 1 TABLET BY MOUTH DAILY AT 6 PM.     There is no refill protocol information for this order

## 2021-07-11 ENCOUNTER — Encounter: Payer: Self-pay | Admitting: Internal Medicine

## 2021-07-12 MED ORDER — AZITHROMYCIN 250 MG PO TABS: ORAL_TABLET | ORAL | 0 refills | Status: DC

## 2021-07-23 ENCOUNTER — Other Ambulatory Visit: Payer: Self-pay | Admitting: Internal Medicine

## 2021-07-25 ENCOUNTER — Encounter: Payer: Self-pay | Admitting: Internal Medicine

## 2021-07-25 NOTE — Telephone Encounter (Signed)
Requested Prescriptions  Pending Prescriptions Disp Refills   traZODone (DESYREL) 50 MG tablet [Pharmacy Med Name: TRAZODONE 50 MG TABLET] 30 tablet 2    Sig: TAKE 0.5 TABLETS BY MOUTH AT BEDTIME AS NEEDED FOR SLEEP.     Psychiatry: Antidepressants - Serotonin Modulator Failed - 07/23/2021  2:30 PM      Failed - Valid encounter within last 6 months    Recent Outpatient Visits          1 month ago Aortic atherosclerosis [facility])   [facility] [place], Coralie Keens, NP   6 months ago Sacral pain   [facility] [place], [place], [place]             Passed - Completed PHQ-2 or PHQ-9 in the last 360 days

## 2021-07-26 MED ORDER — TRAZODONE HCL 50 MG PO TABS: 50.0000 mg | ORAL_TABLET | Freq: Every day | ORAL | 0 refills | Status: DC

## 2021-08-09 ENCOUNTER — Other Ambulatory Visit: Payer: Self-pay | Admitting: Internal Medicine

## 2021-08-09 NOTE — Telephone Encounter (Signed)
Requested medication (s) are due for refill today: yes ativan  Requested medication (s) are on the active medication list: yes   Last refill:  06/26/21 #45 0 refills   Future visit scheduled: no   Notes to clinic:  not delegated per protocol, last OV 1 month ago . Last refill for flomax 07/20/21 and trazadone signed 07/26/21.      Requested Prescriptions  Pending Prescriptions Disp Refills   tamsulosin (FLOMAX) 0.4 MG CAPS capsule [Pharmacy Med Name: TAMSULOSIN HCL 0.4 MG CAPSULE] 90 capsule 3    Sig: TAKE 1 CAPSULE BY MOUTH EVERY DAY     Urology: Alpha-Adrenergic Blocker Passed - 08/09/2021  2:03 PM      Passed - Last BP in normal range    BP Readings from Last 1 Encounters:  06/22/21 130/70          Passed - Valid encounter within last 12 months    Recent Outpatient Visits           1 month ago Aortic atherosclerosis ([place])   [facility], Coralie Keens, NP   6 months ago Sacral pain   [facility] [place], [place], NP               traZODone (DESYREL) 50 MG tablet [Pharmacy Med Name: TRAZODONE 50 MG TABLET] 90 tablet 0    Sig: TAKE 1 TABLET BY MOUTH EVERYDAY AT BEDTIME     Psychiatry: Antidepressants - Serotonin Modulator Failed - 08/09/2021  2:03 PM      Failed - Valid encounter within last 6 months    Recent Outpatient Visits           1 month ago Aortic atherosclerosis [facility])   [facility] [place], Coralie Keens, NP   6 months ago Sacral pain   [place], [place], NP              Passed - Completed PHQ-2 or PHQ-9 in the last 360 days       LORazepam (ATIVAN) 0.5 MG tablet [Pharmacy Med Name: LORAZEPAM 0.5 MG TABLET] 45 tablet 0    Sig: TAKE 1 TO 2 TABLETS BY MOUTH TWICE A DAY AS NEEDED     Not Delegated - Psychiatry:  Anxiolytics/Hypnotics Failed - 08/09/2021  2:03 PM      Failed - This refill cannot be delegated      Failed - Urine Drug Screen completed in last 360 days      Failed -  Valid encounter within last 6 months    Recent Outpatient Visits           1 month ago Aortic atherosclerosis [facility])   [facility] [name], NP   6 months ago Sacral pain   [facility] [place], Coralie Keens, [place]

## 2021-08-16 ENCOUNTER — Ambulatory Visit: Payer: Medicare HMO | Admitting: Neurology

## 2021-08-17 ENCOUNTER — Telehealth: Payer: Self-pay | Admitting: Internal Medicine

## 2021-08-17 NOTE — Telephone Encounter (Signed)
Pt daughter Alexander Booth is calling to report that the [facility] is stating that LORazepam (ATIVAN) 0.5 MG tablet [[id number]] is no on lisit of covered drugs. [employer] is requesting a PA or requesting an exception  CB- [phone]

## 2021-08-20 NOTE — Telephone Encounter (Signed)
PA sent through Cover My Meds.    Thanks,   -Isola Mehlman  

## 2021-08-21 NOTE — Telephone Encounter (Signed)
Left message for Alexander Booth that Alexander Booth' Lorazepam has been approved.  (Per DPR)   Thanks,   -Dequante Tremaine Sookdeo (Key: [id number]) - [id number] LORazepam 0.5MG  tablets     Status: PA Response - Approved  Created: January 30th, 2023  Sent: January 30th, 2023  Open   Archive

## 2021-08-30 ENCOUNTER — Other Ambulatory Visit: Payer: Self-pay | Admitting: Internal Medicine

## 2021-08-30 NOTE — Telephone Encounter (Signed)
Pt had OV on 06/25/21 Requested Prescriptions  Pending Prescriptions Disp Refills   citalopram (CELEXA) 20 MG tablet [Pharmacy Med Name: CITALOPRAM HYDROBROMIDE 20 MG Tablet] 90 tablet 0    Sig: TAKE 1 TABLET EVERY DAY     Psychiatry:  Antidepressants - SSRI Failed - 08/30/2021  9:50 AM      Failed - Valid encounter within last 6 months    Recent Outpatient Visits          2 months ago Aortic atherosclerosis [facility])   [facility] [place], Coralie Keens, NP   7 months ago Sacral pain   [facility] [place], [place], [place]             Passed - Completed PHQ-2 or PHQ-9 in the last 360 days

## 2021-08-31 ENCOUNTER — Encounter: Payer: Self-pay | Admitting: Internal Medicine

## 2021-08-31 ENCOUNTER — Ambulatory Visit: Payer: Self-pay

## 2021-08-31 NOTE — Telephone Encounter (Signed)
Pt's daughter advised via MyChart.  (This is a duplicate message)   Thanks,   -Mickel Baas

## 2021-08-31 NOTE — Telephone Encounter (Signed)
Pt needs to be evaluated at an Urgent Care.  Left message 08/31/2021.  PEC please advise pt when he calls back.   Thanks,   -Mickel Baas

## 2021-08-31 NOTE — Telephone Encounter (Signed)
Summary: rx request / cut on toe   The patient's daughter shares that the patient recently injured their foot on Saturday, cutting their toe   The patient has also dropped something on top of their foot yesterday (08/30/21) and has shared with family that their foot is very sore   The patient has shared with their daughter that the site of the cut on their foot may also be infected   The patient's daughter would like the patient prescribed an antibiotic for the patient to take until they can be seen by their podiatrist on Tuesday   Please contact further when possible           Chief Complaint: Injury to right foot and Great Toe. Cut toe on Sunday and then dropped something on foot yesterday. Symptoms: Pain, swelling and redness Frequency: Yesterday Pertinent Negatives: Patient denies fever Disposition: [] ED /[] Urgent Care (no appt availability in office) / [] Appointment(In office/virtual)/ []  [place] [facility]/ [] Home Care/ [] Refused Recommended Disposition /[]  Mobile Bus/ [x]  Follow-up with PCP Additional Notes: Daughter requesting an antibiotic be sent to pharmacy. States very difficult to get him to office. Please advise.  Answer Assessment - Initial Assessment Questions 1. ONSET: "When did the pain start?"      Sunday 2. LOCATION: "Where is the pain located?"      Right foot - Great toe 3. PAIN: "How bad is the pain?"    (Scale 1-10; or mild, moderate, severe)  - MILD (1-3): doesn't interfere with normal activities.   - MODERATE (4-7): interferes with normal activities (e.g., work or school) or awakens from sleep, limping.   - SEVERE (8-10): excruciating pain, unable to do any normal activities, unable to walk.      Moderate 4. WORK OR EXERCISE: "Has there been any recent work or exercise that involved this part of the body?"      Cut toe Sunday and dropped something on the foot yesterday 5. CAUSE: "What do you think is causing the foot pain?"     Injury 6.  OTHER SYMPTOMS: "Do you have any other symptoms?" (e.g., leg pain, rash, fever, numbness)     Swelling, red 7. PREGNANCY: "Is there any chance you are pregnant?" "When was your last menstrual period?"     N/A  Protocols used: Foot Pain-A-AH

## 2021-09-04 ENCOUNTER — Other Ambulatory Visit: Payer: Self-pay | Admitting: Podiatry

## 2021-09-04 ENCOUNTER — Encounter: Payer: Self-pay | Admitting: Podiatry

## 2021-09-04 ENCOUNTER — Other Ambulatory Visit: Payer: Self-pay

## 2021-09-04 ENCOUNTER — Ambulatory Visit (INDEPENDENT_AMBULATORY_CARE_PROVIDER_SITE_OTHER): Payer: Medicare HMO

## 2021-09-04 ENCOUNTER — Ambulatory Visit: Payer: Medicare HMO | Admitting: Podiatry

## 2021-09-04 DIAGNOSIS — E0843 Diabetes mellitus due to underlying condition with diabetic autonomic (poly)neuropathy: Secondary | ICD-10-CM | POA: Diagnosis not present

## 2021-09-04 DIAGNOSIS — L03116 Cellulitis of left lower limb: Secondary | ICD-10-CM

## 2021-09-04 DIAGNOSIS — Z860100 Personal history of colon polyps, unspecified: Secondary | ICD-10-CM | POA: Insufficient documentation

## 2021-09-04 DIAGNOSIS — L97522 Non-pressure chronic ulcer of other part of left foot with fat layer exposed: Secondary | ICD-10-CM

## 2021-09-04 DIAGNOSIS — Z9689 Presence of other specified functional implants: Secondary | ICD-10-CM | POA: Insufficient documentation

## 2021-09-04 DIAGNOSIS — S99922A Unspecified injury of left foot, initial encounter: Secondary | ICD-10-CM

## 2021-09-04 MED ORDER — AMOXICILLIN-POT CLAVULANATE 875-125 MG PO TABS: 1.0000 | ORAL_TABLET | Freq: Two times a day (BID) | ORAL | 0 refills | Status: DC

## 2021-09-04 MED ORDER — GENTAMICIN SULFATE 0.1 % EX CREA: 1.0000 "application " | TOPICAL_CREAM | Freq: Two times a day (BID) | CUTANEOUS | 1 refills | Status: DC

## 2021-09-04 NOTE — Addendum Note (Signed)
Addended by: Graceann Congress D on: 09/04/2021 01:06 PM   Modules accepted: Orders

## 2021-09-04 NOTE — Progress Notes (Signed)
Subjective:  76 y.o. male with PMHx of diabetes mellitus presenting today for new complaint with his daughter for evaluation of pain and tenderness associated to the left foot.  Patient states that he dropped a caulking gun on the top of his left foot which established laceration.  This was a 2 weeks ago.  He says that the area became red and swollen with drainage.  Patient also has a wound on the bottom of his left great toe which has been present for several weeks.  He says that his foot is now red and swollen and it is increasingly painful to walk on.  He has been applying triple antibiotic ointment and taking Tylenol for as needed for the pain.  Patient states that the pain is currently 9/10   Past Medical History:  Diagnosis Date   Arthritis    COPD (chronic obstructive pulmonary disease) ([place])    Depression    Dyspnea    Erectile dysfunction    He's had a hx of    Hypercholesterolemia    Hypertension    Essential   Lumbosacral disc disease    with chronic low back pain, has implanted nerve stimulator   Peripheral neuropathy    Presenile dementia    Type 2 diabetes mellitus ([place]) 2010   takes oral meds   Past Surgical History:  Procedure Laterality Date   ANTERIOR CERVICAL DECOMP/DISCECTOMY FUSION     APPENDECTOMY     HEMORRHOID SURGERY     x2   JOINT REPLACEMENT Left    2nd one was to change siZe   KNEE SURGERY     Previous left total replacement   KYPHOPLASTY N/A 12/10/2012   Procedure: Lumbar one Kyphoplasty;  Surgeon: Kristeen Miss, MD;  [facility];  Service: Neurosurgery;  Laterality: N/A;  Lumbar One Kyphoplasty   LUMBAR LAMINECTOMY/DECOMPRESSION MICRODISCECTOMY Bilateral 01/25/2021   Procedure: Bilateral Lumbar 4-5, Lumbar 5 Sacral 1 Laminectomy/Foraminotomy;  Surgeon: Erline Levine, MD;  Location: [place];  Service: Neurosurgery;  Laterality: Bilateral;  3C/RM 21   OTHER SURGICAL HISTORY  1992   Bone Fusion in Right Foot   SPINAL CORD STIMULATOR INSERTION  N/A 12/02/2014   Procedure: THORACIC SPINAL CORD STIMULATOR INSERTION INFINION BOSTON [employer] 16 LEAD WITH LAMINECTOMY;  Surgeon: Erline Levine, MD;  Location: [place] [facility];  Service: Neurosurgery;  Laterality: N/A;  THORACIC SPINAL CORD STIMULATOR INSERTION INFINION BOSTON [employer]IFIC 16 LEAD WITH LAMINECTOMY   SPINAL CORD STIMULATOR REMOVAL N/A 10/19/2020   Procedure: Removal of spinal cord stimulator with paddle lead;  Surgeon: Erline Levine, MD;  Location: [place] [facility];  Service: Neurosurgery;  Laterality: N/A;   TOTAL KNEE ARTHROPLASTY Left    Allergies  Allergen Reactions   Codeine Hives and Itching         Objective/Physical Exam General: The patient is alert and oriented x3 in no acute distress.  Dermatology:  Wound #1 noted to the plantar aspect of the left great toe measuring approximately 0.6 x 2.5 x 0.2 cm (LxWxD).   Wound #2 noted to the dorsum of the left foot measuring approximately 1.0 x 1.0 x 0.1 cm.  To the noted ulceration(s), there is no eschar. There is a moderate amount of slough, fibrin, and necrotic tissue noted. Granulation tissue and wound base is red. There is a minimal amount of serosanguineous drainage noted. There is no exposed bone muscle-tendon ligament or joint. There is no malodor. Periwound integrity is intact. Skin is warm, dry and supple bilateral lower extremities.  Vascular: Erythema and edema noted to the foot.  Pulses diminished.  Capillary refill delayed.  Neurological: Epicritic and protective threshold diminished bilaterally.   Musculoskeletal Exam: Range of motion within normal limits to all pedal and ankle joints bilateral. Muscle strength 5/5 in all groups bilateral.   Radiographic exam LT foot: Diffuse degenerative changes noted throughout the foot.  No fractures identified.  Normal osseous mineralization.  Absence of the fifth metatarsal head noted from prior surgery.  Assessment: 1.  Ulcer left great toe and left dorsal foot secondary to  diabetes mellitus 2. diabetes mellitus w/ peripheral neuropathy 3.  Cellulitis left foot   Plan of Care:  1. Patient was evaluated.  X-rays reviewed.  Cultures taken and sent to pathology for culture and sensitivity of the dorsal foot wound 2. medically necessary excisional debridement including subcutaneous tissue was performed using a tissue nipper and a chisel blade. Excisional debridement of all the necrotic nonviable tissue down to healthy bleeding viable tissue was performed with post-debridement measurements same as pre-. 3. the wound was cleansed and dry sterile dressing applied. 4.  Prescription for gentamicin cream applied daily 5.  Prescription for Augmentin 875/125 mg #22 times daily 6.  Postsurgical shoe dispensed.  Weightbearing as tolerated 7.  Order placed for ABI w/ TBI LLE.  Due to concern for possible vascular compromise and PAD 8.  Return to clinic 2 weeks   Edrick Kins, DPM [facility]  Dr. Edrick Kins, DPM    [address], [place] [address]                Office [phone]  Fax [phone]

## 2021-09-07 ENCOUNTER — Telehealth: Payer: Self-pay | Admitting: *Deleted

## 2021-09-07 NOTE — Telephone Encounter (Signed)
Patient has a Nurse, children's.[place] ABI, has to be pre authorized. before scheduling.please contact Damita Dunnings w/ vein and [phone]) afterwards giving him information so that he may contact patient to schedule.

## 2021-09-07 NOTE — Telephone Encounter (Signed)
CPT code: for this study [phone].

## 2021-09-11 DIAGNOSIS — L02425 Furuncle of right lower limb: Secondary | ICD-10-CM | POA: Diagnosis not present

## 2021-09-11 DIAGNOSIS — F1120 Opioid dependence, uncomplicated: Secondary | ICD-10-CM | POA: Diagnosis not present

## 2021-09-11 DIAGNOSIS — Z08 Encounter for follow-up examination after completed treatment for malignant neoplasm: Secondary | ICD-10-CM | POA: Diagnosis not present

## 2021-09-11 DIAGNOSIS — L570 Actinic keratosis: Secondary | ICD-10-CM | POA: Diagnosis not present

## 2021-09-11 DIAGNOSIS — L02426 Furuncle of left lower limb: Secondary | ICD-10-CM | POA: Diagnosis not present

## 2021-09-11 DIAGNOSIS — L819 Disorder of pigmentation, unspecified: Secondary | ICD-10-CM | POA: Diagnosis not present

## 2021-09-11 DIAGNOSIS — M5442 Lumbago with sciatica, left side: Secondary | ICD-10-CM | POA: Diagnosis not present

## 2021-09-11 DIAGNOSIS — L578 Other skin changes due to chronic exposure to nonionizing radiation: Secondary | ICD-10-CM | POA: Diagnosis not present

## 2021-09-11 DIAGNOSIS — G20 Parkinson's disease: Secondary | ICD-10-CM | POA: Diagnosis not present

## 2021-09-11 DIAGNOSIS — L853 Xerosis cutis: Secondary | ICD-10-CM | POA: Diagnosis not present

## 2021-09-11 DIAGNOSIS — L218 Other seborrheic dermatitis: Secondary | ICD-10-CM | POA: Diagnosis not present

## 2021-09-11 DIAGNOSIS — Z85828 Personal history of other malignant neoplasm of skin: Secondary | ICD-10-CM | POA: Diagnosis not present

## 2021-09-11 DIAGNOSIS — Z6825 Body mass index (BMI) 25.0-25.9, adult: Secondary | ICD-10-CM | POA: Diagnosis not present

## 2021-09-11 LAB — WOUND CULTURE: Organism ID, Bacteria: NONE SEEN

## 2021-09-12 ENCOUNTER — Other Ambulatory Visit: Payer: Self-pay | Admitting: Family

## 2021-09-13 ENCOUNTER — Telehealth: Payer: Self-pay | Admitting: *Deleted

## 2021-09-13 ENCOUNTER — Other Ambulatory Visit: Payer: Self-pay | Admitting: Internal Medicine

## 2021-09-13 NOTE — Telephone Encounter (Signed)
I called and made him an appt with Webb Silversmith, NP for 09/20/2021 for his 6 month check and med refills.

## 2021-09-13 NOTE — Telephone Encounter (Signed)
Requested medication (s) are due for refill today:   Provider to review  Requested medication (s) are on the active medication list:   Yes for both  Future visit scheduled:   Yes I called and got him an appt with [name] in office visit 09/20/2021 at 8:40.   Last ordered: Trazodone 07/26/2021 #90, 0 refills;  Ativan 08/09/2021 #45, 0 refills  Returned because protocol criteria not met.    Did not have a valid encounter within the last 6 months so appt made.  Ativan non delegated   Requested Prescriptions  Pending Prescriptions Disp Refills   traZODone (DESYREL) 50 MG tablet [Pharmacy Med Name: TRAZODONE 50 MG TABLET] 90 tablet 0    Sig: TAKE 1 TABLET BY MOUTH EVERYDAY AT BEDTIME     Psychiatry: Antidepressants - Serotonin Modulator Failed - 09/13/2021  9:07 AM      Failed - Valid encounter within last 6 months    Recent Outpatient Visits           2 months ago Aortic atherosclerosis [facility])   [facility], Coralie Keens, NP   8 months ago Sacral pain   [facility] [place], Coralie Keens, NP       Future Appointments             In 1 week [place], Coralie Keens, NP [facility], [place] - Completed PHQ-2 or PHQ-9 in the last 360 days       LORazepam (ATIVAN) 0.5 MG tablet [Pharmacy Med Name: LORAZEPAM 0.5 MG TABLET] 45 tablet 0    Sig: TAKE 1 TO 2 TABLETS BY MOUTH TWICE A DAY AS NEEDED     Not Delegated - Psychiatry: Anxiolytics/Hypnotics 2 Failed - 09/13/2021  9:07 AM      Failed - This refill cannot be delegated      Failed - Urine Drug Screen completed in last 360 days      Failed - Valid encounter within last 6 months    Recent Outpatient Visits           2 months ago Aortic atherosclerosis [facility])   [facility] [place], Coralie Keens, NP   8 months ago Sacral pain   [facility] [place], Coralie Keens, NP       Future Appointments             In 1 week Garnette Gunner, Coralie Keens, NP [facility], [place] - Patient is not pregnant

## 2021-09-14 ENCOUNTER — Other Ambulatory Visit: Payer: Self-pay

## 2021-09-14 ENCOUNTER — Ambulatory Visit (HOSPITAL_COMMUNITY)
Admission: RE | Admit: 2021-09-14 | Discharge: 2021-09-14 | Disposition: A | Discharge status: Home / Self Care | Payer: Medicare HMO | Source: Ambulatory Visit | Attending: Podiatry | Admitting: Podiatry

## 2021-09-14 DIAGNOSIS — L97522 Non-pressure chronic ulcer of other part of left foot with fat layer exposed: Secondary | ICD-10-CM | POA: Diagnosis not present

## 2021-09-14 DIAGNOSIS — E0843 Diabetes mellitus due to underlying condition with diabetic autonomic (poly)neuropathy: Secondary | ICD-10-CM | POA: Insufficient documentation

## 2021-09-17 ENCOUNTER — Other Ambulatory Visit: Payer: Self-pay

## 2021-09-17 ENCOUNTER — Ambulatory Visit: Payer: Medicare HMO | Admitting: Podiatry

## 2021-09-17 ENCOUNTER — Other Ambulatory Visit: Payer: Self-pay | Admitting: Internal Medicine

## 2021-09-17 DIAGNOSIS — F03918 Unspecified dementia, unspecified severity, with other behavioral disturbance: Secondary | ICD-10-CM

## 2021-09-17 DIAGNOSIS — G4700 Insomnia, unspecified: Secondary | ICD-10-CM

## 2021-09-17 DIAGNOSIS — F32A Depression, unspecified: Secondary | ICD-10-CM

## 2021-09-17 DIAGNOSIS — E0843 Diabetes mellitus due to underlying condition with diabetic autonomic (poly)neuropathy: Secondary | ICD-10-CM

## 2021-09-17 DIAGNOSIS — L97522 Non-pressure chronic ulcer of other part of left foot with fat layer exposed: Secondary | ICD-10-CM

## 2021-09-17 DIAGNOSIS — M2141 Flat foot [pes planus] (acquired), right foot: Secondary | ICD-10-CM

## 2021-09-17 DIAGNOSIS — M2142 Flat foot [pes planus] (acquired), left foot: Secondary | ICD-10-CM

## 2021-09-17 NOTE — Progress Notes (Signed)
Subjective:  76 y.o. male with PMHx of diabetes mellitus presenting today for new complaint with his daughter for evaluation of pain and tenderness associated to the left foot.  Patient states that he dropped a caulking gun on the top of his left foot which established laceration.  This was a 2 weeks ago.  He says that the area became red and swollen with drainage.  Patient also has a wound on the bottom of his left great toe which has been present for several weeks.  He says that his foot is now red and swollen and it is increasingly painful to walk on.  He has been applying triple antibiotic ointment and taking Tylenol for as needed for the pain.  Patient states that the pain is currently 9/10   Past Medical History:  Diagnosis Date   Arthritis    COPD (chronic obstructive pulmonary disease) ([place])    Depression    Dyspnea    Erectile dysfunction    He's had a hx of    Hypercholesterolemia    Hypertension    Essential   Lumbosacral disc disease    with chronic low back pain, has implanted nerve stimulator   Peripheral neuropathy    Presenile dementia    Type 2 diabetes mellitus ([place]) 2010   takes oral meds   Past Surgical History:  Procedure Laterality Date   ANTERIOR CERVICAL DECOMP/DISCECTOMY FUSION     APPENDECTOMY     HEMORRHOID SURGERY     x2   JOINT REPLACEMENT Left    2nd one was to change siZe   KNEE SURGERY     Previous left total replacement   KYPHOPLASTY N/A 12/10/2012   Procedure: Lumbar one Kyphoplasty;  Surgeon: Kristeen Miss, MD;  [facility]: [facility];  Service: Neurosurgery;  Laterality: N/A;  Lumbar One Kyphoplasty   LUMBAR LAMINECTOMY/DECOMPRESSION MICRODISCECTOMY Bilateral 01/25/2021   Procedure: Bilateral Lumbar 4-5, Lumbar 5 Sacral 1 Laminectomy/Foraminotomy;  Surgeon: Erline Levine, MD;  Location: [place];  Service: Neurosurgery;  Laterality: Bilateral;  3C/RM 21   OTHER SURGICAL HISTORY  1992   Bone Fusion in Right Foot   SPINAL CORD STIMULATOR INSERTION  N/A 12/02/2014   Procedure: THORACIC SPINAL CORD STIMULATOR INSERTION INFINION [employer] 16 LEAD WITH LAMINECTOMY;  Surgeon: Erline Levine, MD;  Location: [place] [facility];  Service: Neurosurgery;  Laterality: N/A;  THORACIC SPINAL CORD STIMULATOR INSERTION INFINION [employer] 16 LEAD WITH LAMINECTOMY   SPINAL CORD STIMULATOR REMOVAL N/A 10/19/2020   Procedure: Removal of spinal cord stimulator with paddle lead;  Surgeon: Erline Levine, MD;  Location: [place];  Service: Neurosurgery;  Laterality: N/A;   TOTAL KNEE ARTHROPLASTY Left    Allergies  Allergen Reactions   Codeine Hives and Itching         Objective/Physical Exam General: The patient is alert and oriented x3 in no acute distress.  Dermatology:  Wound #1 noted to the plantar aspect of the left great toe measuring approximately 0.6 x 2.5 x 0.2 cm (LxWxD).   Wound #2 noted to the dorsum of the left foot measuring approximately 1.0 x 1.0 x 0.1 cm.  To the noted ulceration(s), there is no eschar. There is a moderate amount of slough, fibrin, and necrotic tissue noted. Granulation tissue and wound base is red. There is a minimal amount of serosanguineous drainage noted. There is no exposed bone muscle-tendon ligament or joint. There is no malodor. Periwound integrity is intact. Skin is warm, dry and supple bilateral lower extremities.  Vascular: VAS [place] ABI WITH/WO TBI 09/14/2021 Summary:  Right: Resting right ankle-brachial index is within normal range. No  evidence of significant right lower extremity arterial disease. The right  toe-brachial index is normal.   Left: Resting left ankle-brachial index is within normal range. No  evidence of significant left lower extremity arterial disease. The left  toe-brachial index is normal.   Neurological: Epicritic and protective threshold diminished bilaterally.   Musculoskeletal Exam: Range of motion within normal limits to all pedal and ankle joints bilateral. Muscle strength  5/5 in all groups bilateral.   Radiographic exam LT foot 09/04/2021: Diffuse degenerative changes noted throughout the foot.  No fractures identified.  Normal osseous mineralization.  Absence of the fifth metatarsal head noted from prior surgery.  Assessment: 1.  Ulcer left great toe and left dorsal foot secondary to diabetes mellitus 2. diabetes mellitus w/ peripheral neuropathy 3.  Cellulitis left foot; resolved   Plan of Care:  1. Patient was evaluated.  Vascular results were reviewed today 2. medically necessary excisional debridement including subcutaneous tissue was performed using a tissue nipper and a chisel blade. Excisional debridement of all the necrotic nonviable tissue down to healthy bleeding viable tissue was performed with post-debridement measurements same as pre-. 3. the wound was cleansed and dry sterile dressing applied. 4.  Continue gentamicin cream daily 5.  Patient completed the oral Augmentin 875/125 mg  6.  Continue postsurgical shoe 7.  Appointment with Pedorthist for new diabetic shoes and insoles 8.  Return to clinic 2 weeks   Edrick Kins, DPM [facility]  Dr. Edrick Kins, DPM    [address], [place] [address]                Office [phone]  Fax [phone]

## 2021-09-18 NOTE — Telephone Encounter (Signed)
Requested medication (s) are due for refill today:   Yes for both  Requested medication (s) are on the active medication list:   Yes for both  Future visit scheduled:   Yes in 2 days   Last ordered: 11/01/2020 90 day supply with 3 refills for both.  Returned because protocol criteria not met.   Labs due.   Requested Prescriptions  Pending Prescriptions Disp Refills   donepezil (ARICEPT) 10 MG tablet [Pharmacy Med Name: DONEPEZIL HCL 10 MG Tablet] 90 tablet 3    Sig: TAKE 1 TABLET EVERY MORNING     Neurology:  Alzheimer's Agents Failed - 09/17/2021  6:39 AM      Failed - Valid encounter within last 6 months    Recent Outpatient Visits           2 months ago Aortic atherosclerosis ([place])   [facility] [place], Coralie Keens, NP   8 months ago Sacral pain   [facility] [place], Coralie Keens, NP       Future Appointments             In 2 days [place] [place], Coralie Keens, NP [facility]             divalproex (DEPAKOTE ER) 500 MG 24 hr tablet [Pharmacy Med Name: DIVALPROEX SODIUM ER 500 MG Tablet Extended Release 24 Hour] 180 tablet 3    Sig: TAKE 2 [place]     Neurology:  Anticonvulsants - Valproates Failed - 09/17/2021  6:39 AM      Failed - AST in normal range and within 360 days    AST  Date Value Ref Range Status  01/23/2021 14 (L) 15 - 41 U/L Final          Failed - HGB in normal range and within 360 days    Hemoglobin  Date Value Ref Range Status  01/23/2021 12.5 (L) 13.0 - 17.0 g/dL Final          Failed - HCT in normal range and within 360 days    HCT  Date Value Ref Range Status  01/23/2021 38.0 (L) 39.0 - 52.0 % Final          Failed - Valproic Acid (serum) in normal range and within 360 days    Valproic Acid Lvl  Date Value Ref Range Status  12/17/2016 53 50.0 - 100.0 ug/mL Final          Passed - ALT in normal range and within 360 days    ALT  Date Value Ref Range Status  01/23/2021 5 0 - 44 U/L Final           Passed - PLT in normal range and within 360 days    Platelets  Date Value Ref Range Status  01/23/2021 168 150 - 400 K/uL Final          Passed - WBC in normal range and within 360 days    WBC  Date Value Ref Range Status  01/23/2021 6.1 4.0 - 10.5 K/uL Final          Passed - Completed PHQ-2 or PHQ-9 in the last 360 days      Passed - Patient is not pregnant      Passed - Valid encounter within last 12 months    Recent Outpatient Visits           2 months ago Aortic atherosclerosis ([place])   [place], NP   8 months ago Sacral pain   [facility] [place], Coralie Keens, NP       Future Appointments             In 2 days Garnette Gunner, Coralie Keens, NP [facility]

## 2021-09-20 ENCOUNTER — Encounter: Payer: Self-pay | Admitting: Internal Medicine

## 2021-09-20 ENCOUNTER — Other Ambulatory Visit: Payer: Self-pay

## 2021-09-20 ENCOUNTER — Ambulatory Visit (INDEPENDENT_AMBULATORY_CARE_PROVIDER_SITE_OTHER): Payer: Medicare HMO | Admitting: Internal Medicine

## 2021-09-20 VITALS — BP 148/72 | HR 82 | Temp 97.1°F | Wt 191.0 lb

## 2021-09-20 DIAGNOSIS — N3943 Post-void dribbling: Secondary | ICD-10-CM

## 2021-09-20 DIAGNOSIS — E118 Type 2 diabetes mellitus with unspecified complications: Secondary | ICD-10-CM | POA: Diagnosis not present

## 2021-09-20 DIAGNOSIS — Z6825 Body mass index (BMI) 25.0-25.9, adult: Secondary | ICD-10-CM | POA: Diagnosis not present

## 2021-09-20 DIAGNOSIS — Z0001 Encounter for general adult medical examination with abnormal findings: Secondary | ICD-10-CM

## 2021-09-20 DIAGNOSIS — E663 Overweight: Secondary | ICD-10-CM | POA: Diagnosis not present

## 2021-09-20 DIAGNOSIS — N401 Enlarged prostate with lower urinary tract symptoms: Secondary | ICD-10-CM | POA: Diagnosis not present

## 2021-09-20 LAB — POCT URINALYSIS DIPSTICK
Bilirubin, UA: NEGATIVE
Blood, UA: NEGATIVE
Glucose, UA: POSITIVE — AB
Ketones, UA: NEGATIVE
Leukocytes, UA: NEGATIVE
Nitrite, UA: NEGATIVE
Protein, UA: NEGATIVE
Spec Grav, UA: >=1.03 — AB (ref 1.010–1.025)
Urobilinogen, UA: 0.2 E.U./dL
pH, UA: 5 (ref 5.0–8.0)

## 2021-09-20 LAB — POCT GLYCOSYLATED HEMOGLOBIN (HGB A1C): Hemoglobin A1C: 7.4 % — AB (ref 4.0–5.6)

## 2021-09-20 NOTE — Progress Notes (Signed)
Subjective:    Patient ID: Alexander Booth, male    DOB: August 15, 1945, 76 y.o.   MRN: [id number]  HPI  Patient presents to clinic today for his annual exam.  Flu: 04/2021 Tetanus: 08/2012 COVID: Pfizer x 4 Pneumovax: 04/2018 Prevnar: 09/2015 Zostavax: 09/2012 Shingrix: never PSA screening: 10/2020 Colon screening: 09/2019 Vision screening: as needed Dentist: as needed, dentures  Diet: He does eat some meat. He does eat fruits and veggies. He does eat fried foods. He drinks mostly water, sweet tea and coffee. Exercise: None  Review of Systems  Past Medical History:  Diagnosis Date   Arthritis    COPD (chronic obstructive pulmonary disease) (HCC)    Depression    Dyspnea    Erectile dysfunction    He's had a hx of    Hypercholesterolemia    Hypertension    Essential   Lumbosacral disc disease    with chronic low back pain, has implanted nerve stimulator   Peripheral neuropathy    Presenile dementia    Type 2 diabetes mellitus (HCC) 2010   takes oral meds    Current Outpatient Medications  Medication Sig Dispense Refill   Accu-Chek Softclix Lancets lancets Check sugar 2 times daily. DX E11.8 200 each 3   acetaminophen (TYLENOL) 500 MG tablet Take 1,000 mg by mouth every 6 (six) hours as needed for moderate pain. (Patient not taking: Reported on 06/22/2021)     Albuterol Sulfate (PROAIR RESPICLICK) 108 (90 Base) MCG/ACT AEPB Inhale 1-2 puffs into the lungs every 4 (four) hours as needed. (Patient taking differently: Inhale 1-2 puffs into the lungs every 4 (four) hours as needed (shortness of breath).) 1 each 11   amoxicillin-clavulanate (AUGMENTIN) 875-125 MG tablet Take 1 tablet by mouth 2 (two) times daily. 20 tablet 0   aspirin EC 81 MG tablet Take 81 mg by mouth daily with breakfast.     azithromycin (ZITHROMAX) 250 MG tablet Take 2 tabs today, then 1 tab daily x 4 days 6 tablet 0   Blood Glucose Monitoring Suppl (ACCU-CHEK AVIVA PLUS) w/Device KIT Check sugar 2 times  daily. DX E11.8 1 kit 12   BREO ELLIPTA 100-25 MCG/INH AEPB INHALE 1 PUFF BY MOUTH EVERY DAY 60 each 2   carbidopa-levodopa (SINEMET IR) 25-100 MG tablet TAKE 1.5 TABLETS BY MOUTH 3 TIMES A DAY 405 tablet 0   Cholecalciferol (QC VITAMIN D3) 50 MCG (2000 UT) TABS Take 2,000 Units by mouth daily with lunch.     citalopram (CELEXA) 20 MG tablet TAKE 1 TABLET EVERY DAY 90 tablet 1   cyclobenzaprine (FLEXERIL) 5 MG tablet Take 5 mg by mouth 2 (two) times daily.     cyclobenzaprine (FLEXERIL) 5 MG tablet Take 1 tablet by mouth 2 (two) times daily.     divalproex (DEPAKOTE ER) 500 MG 24 hr tablet TAKE 2 TABLETS EVERY DAY 180 tablet 0   donepezil (ARICEPT) 10 MG tablet Take 1 tablet (10 mg total) by mouth daily. 90 tablet 0   famotidine (PEPCID) 10 MG tablet Take 2 tablets (20 mg total) by mouth daily. 180 tablet 3   furosemide (LASIX) 20 MG tablet Take 20 mg by mouth 2 (two) times daily.     gabapentin (NEURONTIN) 400 MG capsule TAKE 3 CAPSULES AT BEDTIME AND MAY REPEAT DOSE ONE TIME IF NEEDED (Patient taking differently: Take 800 mg by mouth at bedtime.) 270 capsule 3   gentamicin cream (GARAMYCIN) 0.1 % Apply 1 application topically 2 (two) times daily. 30  g 1   glucose blood (ACCU-CHEK AVIVA PLUS) test strip Check sugar 2 times daily. DX E11.8 200 each 3   glucose blood (ACCU-CHEK GUIDE) test strip Use as instructed 100 each 12   LORazepam (ATIVAN) 0.5 MG tablet TAKE 1 TO 2 TABLETS BY MOUTH TWICE A DAY AS NEEDED 45 tablet 0   meloxicam (MOBIC) 15 MG tablet TAKE 1 TABLET BY MOUTH EVERY DAY 90 tablet 3   memantine (NAMENDA XR) 7 MG CP24 24 hr capsule TAKE 1 CAPSULE (7 MG TOTAL) BY MOUTH EVERY MORNING. 90 capsule 1   metFORMIN (GLUCOPHAGE) 500 MG tablet TAKE 1 TABLET TWICE DAILY WITH LUNCH AND WITH SUPPER (PER ADMIN INSTRUCTIONS) (Patient taking differently: Take 500 mg by mouth 2 (two) times daily with a meal.) 180 tablet 3   methocarbamol (ROBAXIN) 750 MG tablet Take 750 mg by mouth 3 (three) times  daily.     mirtazapine (REMERON) 45 MG tablet TAKE 1 TABLET AT BEDTIME (Patient taking differently: Take 45 mg by mouth.) 90 tablet 3   nitroGLYCERIN (NITROSTAT) 0.4 MG SL tablet Place 0.4 mg under the tongue every 5 (five) minutes x 3 doses as needed for chest pain. (Patient not taking: Reported on 06/22/2021)     oxyCODONE-acetaminophen (PERCOCET) 5-325 MG tablet Take 1-2 tablets by mouth every 6 (six) hours as needed for severe pain. 20 tablet 0   simvastatin (ZOCOR) 40 MG tablet TAKE 1 TABLET EVERY DAY AT 6:00PM 90 tablet 0   SYMPROIC 0.2 MG TABS      tamsulosin (FLOMAX) 0.4 MG CAPS capsule TAKE 1 CAPSULE BY MOUTH EVERY DAY 90 capsule 0   traZODone (DESYREL) 50 MG tablet TAKE 1 TABLET BY MOUTH EVERYDAY AT BEDTIME 90 tablet 0   vitamin C (ASCORBIC ACID) 500 MG tablet Take 500 mg by mouth daily with lunch.     No current facility-administered medications for this visit.    Allergies  Allergen Reactions   Codeine Hives and Itching    Family History  Problem Relation Age of Onset   Multiple myeloma Mother    Diabetes Mother    Diabetes Sister    Lymphoma Sister    Stroke Neg Hx     Social History   Socioeconomic History   Marital status: Married    Spouse name: Bonita Quin   Number of children: 3   Years of education: 12   Highest education level: Not on file  Occupational History   Occupation: retired    Comment: retired  Tobacco Use   Smoking status: Every Day    Packs/day: 1.00    Years: 40.00    Pack years: 40.00    Types: Cigarettes   Smokeless tobacco: Never  Vaping Use   Vaping Use: Never used  Substance and Sexual Activity   Alcohol use: No    Alcohol/week: 0.0 standard drinks   Drug use: No   Sexual activity: Not on file  Other Topics Concern   Not on file  Social History Narrative   Patient is right handed   Patient resides with wife,consumes 2-3 cups of caffeine daily   Social Determinants of Health   Financial Resource Strain: Not on file  Food  Insecurity: Not on file  Transportation Needs: Not on file  Physical Activity: Not on file  Stress: Not on file  Social Connections: Not on file  Intimate Partner Violence: Not on file     Constitutional: Pt reports chronic fatigue. Denies fever, malaise, headache or abrupt weight changes.  HEENT: Denies eye pain, eye redness, ear pain, ringing in the ears, wax buildup, runny nose, nasal congestion, bloody nose, or sore throat. Respiratory: Patient reports shortness of breath.  Denies difficulty breathing, cough or sputum production.   Cardiovascular: Denies chest pain, chest tightness, palpitations or swelling in the hands or feet.  Gastrointestinal: Pt reports constipation. Denies abdominal pain, bloating, diarrhea or blood in the stool.  GU: Pt reports post void dribbling. Denies urgency, frequency, pain with urination, burning sensation, blood in urine, odor or discharge. Musculoskeletal: Patient reports chronic back pain, difficulty with gait.  Denies decrease in range of motion, muscle pain or joint swelling.  Skin: Pt reports wound infection of left foot. Denies redness, rashes, lesions.  Neurological: Patient reports insomnia and neuropathy.  Denies dizziness, difficulty with memory, difficulty with speech or problems with balance and coordination.  Psych: Patient has a history of depression.  Denies anxiety, SI/HI.  No other specific complaints in a complete review of systems (except as listed in HPI above).     Objective:   Physical Exam  BP (!) 148/72   Pulse 82   Temp (!) 97.1 F (36.2 C) (Temporal)   Wt 191 lb (86.6 kg)   SpO2 96%   BMI 25.90 kg/m   Wt Readings from Last 3 Encounters:  06/22/21 170 lb 4.8 oz (77.2 kg)  04/03/21 168 lb 12.8 oz (76.6 kg)  01/25/21 190 lb (86.2 kg)    General: Appears his stated age, overweight, in NAD. Skin: Warm, dry and intact. Right foot bandaged with post op shoe on. HEENT: Head: normal shape and size; Eyes: sclera white and  EOMs intact; Ears: HOH. Neck:  Neck supple, trachea midline. No masses, lumps or thyromegaly present.  Cardiovascular: Normal rate and rhythm. S1,S2 noted.  No murmur, rubs or gallops noted. No JVD or BLE edema. No carotid bruits noted. Pulmonary/Chest: Normal effort and positive vesicular breath sounds. No respiratory distress. No wheezes, rales or ronchi noted.  Abdomen: Soft and nontender. Normal bowel sounds.  Musculoskeletal: Strength 5/5 BUE/BLE. Gait slow and steady with of cane. Neurological: Alert and oriented. Resting tremor noted of right hand. Bradykinesia noted. Psychiatric: Mood and affect normal. Behavior is normal. Judgment and thought content normal.     BMET    Component Value Date/Time   NA 135 01/23/2021 0938   NA 141 10/11/2014 1122   K 4.3 01/23/2021 0938   CL 100 01/23/2021 0938   CO2 28 01/23/2021 0938   GLUCOSE 122 (H) 01/23/2021 0938   BUN 13 01/23/2021 0938   BUN 11 10/11/2014 1122   CREATININE 1.19 01/23/2021 0938   CREATININE 0.72 03/26/2013 1608   CALCIUM 9.2 01/23/2021 0938   GFRNONAA >60 01/23/2021 0938   GFRAA >60 12/25/2019 0358    Lipid Panel     Component Value Date/Time   CHOL 221 (H) 10/27/2020 1445   TRIG 527 (H) 10/27/2020 1445   HDL 35 (L) 10/27/2020 1445   CHOLHDL 6.3 (H) 10/27/2020 1445   VLDL 75.6 (H) 04/27/2019 1116   LDLCALC  10/27/2020 1445     Comment:     . LDL cholesterol not calculated. Triglyceride levels greater than 400 mg/dL invalidate calculated LDL results. . Reference range: <100 . Desirable range <100 mg/dL for primary prevention;   <70 mg/dL for patients with CHD or diabetic patients  with > or = 2 CHD risk factors. Marland Kitchen LDL-C is now calculated using the Martin-Hopkins  calculation, which is a validated novel method providing  better accuracy than the Friedewald equation in the  estimation of LDL-C.  Horald Pollen et al. Lenox Ahr. 1610;960(45): 2061-2068  (http://education.[employer].com/faq/FAQ164)      CBC    Component Value Date/Time   WBC 6.1 01/23/2021 0938   RBC 3.79 (L) 01/23/2021 0938   HGB 12.5 (L) 01/23/2021 0938   HCT 38.0 (L) 01/23/2021 0938   PLT 168 01/23/2021 0938   MCV 100.3 (H) 01/23/2021 0938   MCH 33.0 01/23/2021 0938   MCHC 32.9 01/23/2021 0938   RDW 11.9 01/23/2021 0938   RDW 13.5 10/11/2014 1122   LYMPHSABS 0.9 12/25/2019 0358   LYMPHSABS 1.3 10/11/2014 1122   MONOABS 0.5 12/25/2019 0358   EOSABS 0.0 12/25/2019 0358   EOSABS 0.0 10/11/2014 1122   BASOSABS 0.0 12/25/2019 0358   BASOSABS 0.0 10/11/2014 1122    Hgb A1C Lab Results  Component Value Date   HGBA1C 7.0 (H) 01/23/2021            Assessment & Plan:   Preventative health maintenance:  Flu shot UTD Tetanus UTD Covid vaccine UTD Pneumovax and Prevnar UTD Zostavax UTD He declines Shingrix vaccine Colon screening UTD Encouraged him to consume a balanced diet and exercise regimen Advised him to see an eye doctor and dentist annually We will check CBC, c-Met, lipid, A1c, urine microalbumin and PSA today  RTC in 6 months, follow-up chronic conditions Nicki Reaper, NP  This visit occurred during the SARS-CoV-2 public health emergency.  Safety protocols were in place, including screening questions prior to the visit, additional usage of staff PPE, and extensive cleaning of exam room while observing appropriate contact time as indicated for disinfecting solutions.

## 2021-09-20 NOTE — Patient Instructions (Signed)
Health Maintenance After Age 78 After age 78, you are at a higher risk for certain long-term diseases and infections as well as injuries from falls. Falls are a major cause of broken bones and head injuries in people who are older than age 78. Getting regular preventive care can help to keep you healthy and well. Preventive care includes getting regular testing and making lifestyle changes as recommended by your health care provider. Talk with your health care provider about: Which screenings and tests you should have. A screening is a test that checks for a disease when you have no symptoms. A diet and exercise plan that is right for you. What should I know about screenings and tests to prevent falls? Screening and testing are the best ways to find a health problem early. Early diagnosis and treatment give you the best chance of managing medical conditions that are common after age 78. Certain conditions and lifestyle choices may make you more likely to have a fall. Your health care provider may recommend: Regular vision checks. Poor vision and conditions such as cataracts can make you more likely to have a fall. If you wear glasses, make sure to get your prescription updated if your vision changes. Medicine review. Work with your health care provider to regularly review all of the medicines you are taking, including over-the-counter medicines. Ask your health care provider about any side effects that may make you more likely to have a fall. Tell your health care provider if any medicines that you take make you feel dizzy or sleepy. Strength and balance checks. Your health care provider may recommend certain tests to check your strength and balance while standing, walking, or changing positions. Foot health exam. Foot pain and numbness, as well as not wearing proper footwear, can make you more likely to have a fall. Screenings, including: Osteoporosis screening. Osteoporosis is a condition that causes  the bones to get weaker and break more easily. Blood pressure screening. Blood pressure changes and medicines to control blood pressure can make you feel dizzy. Depression screening. You may be more likely to have a fall if you have a fear of falling, feel depressed, or feel unable to do activities that you used to do. Alcohol use screening. Using too much alcohol can affect your balance and may make you more likely to have a fall. Follow these instructions at home: Lifestyle Do not drink alcohol if: Your health care provider tells you not to drink. If you drink alcohol: Limit how much you have to: 0-1 drink a day for women. 0-2 drinks a day for men. Know how much alcohol is in your drink. In the [place] one drink equals one 12 oz bottle of beer (355 mL), one 5 oz glass of wine (148 mL), or one 1 oz glass of hard liquor (44 mL). Do not use any products that contain nicotine or tobacco. These products include cigarettes, chewing tobacco, and vaping devices, such as e-cigarettes. If you need help quitting, ask your health care provider. Activity  Follow a regular exercise program to stay fit. This will help you maintain your balance. Ask your health care provider what types of exercise are appropriate for you. If you need a cane or walker, use it as recommended by your health care provider. Wear supportive shoes that have nonskid soles. Safety  Remove any tripping hazards, such as rugs, cords, and clutter. Install safety equipment such as grab bars in bathrooms and safety rails on stairs. Keep rooms and walkways   well-lit. General instructions Talk with your health care provider about your risks for falling. Tell your health care provider if: You fall. Be sure to tell your health care provider about all falls, even ones that seem minor. You feel dizzy, tiredness (fatigue), or off-balance. Take over-the-counter and prescription medicines only as told by your health care provider. These include  supplements. Eat a healthy diet and maintain a healthy weight. A healthy diet includes low-fat dairy products, low-fat (lean) meats, and fiber from whole grains, beans, and lots of fruits and vegetables. Stay current with your vaccines. Schedule regular health, dental, and eye exams. Summary Having a healthy lifestyle and getting preventive care can help to protect your health and wellness after age 78. Screening and testing are the best way to find a health problem early and help you avoid having a fall. Early diagnosis and treatment give you the best chance for managing medical conditions that are more common for people who are older than age 78. Falls are a major cause of broken bones and head injuries in people who are older than age 78. Take precautions to prevent a fall at home. Work with your health care provider to learn what changes you can make to improve your health and wellness and to prevent falls. This information is not intended to replace advice given to you by your health care provider. Make sure you discuss any questions you have with your health care provider. Document Revised: 11/27/2020 Document Reviewed: 11/27/2020 Elsevier Patient Education  2022 Elsevier Inc.  

## 2021-09-20 NOTE — Assessment & Plan Note (Signed)
Encourage diet for weight loss 

## 2021-09-20 NOTE — Assessment & Plan Note (Addendum)
Urinalysis negative for signs of infection ?PSA today ?

## 2021-09-21 ENCOUNTER — Telehealth: Payer: Self-pay

## 2021-09-21 LAB — COMPLETE METABOLIC PANEL WITH GFR
AG Ratio: 1.6 (calc) (ref 1.0–2.5)
ALT: 3 U/L — ABNORMAL LOW (ref 9–46)
AST: 11 U/L (ref 10–35)
Albumin: 4.4 g/dL (ref 3.6–5.1)
Alkaline phosphatase (APISO): 55 U/L (ref 35–144)
BUN: 18 mg/dL (ref 7–25)
CO2: 30 mmol/L (ref 20–32)
Calcium: 9.9 mg/dL (ref 8.6–10.3)
Chloride: 100 mmol/L (ref 98–110)
Creat: 1.13 mg/dL (ref 0.70–1.28)
Globulin: 2.8 g/dL (calc) (ref 1.9–3.7)
Glucose, Bld: 179 mg/dL — ABNORMAL HIGH (ref 65–99)
Potassium: 5.6 mmol/L — ABNORMAL HIGH (ref 3.5–5.3)
Sodium: 140 mmol/L (ref 135–146)
Total Bilirubin: 0.3 mg/dL (ref 0.2–1.2)
Total Protein: 7.2 g/dL (ref 6.1–8.1)
eGFR: 67 mL/min/{1.73_m2} (ref >=60)

## 2021-09-21 LAB — MICROALBUMIN / CREATININE URINE RATIO
Creatinine, Urine: 209 mg/dL (ref 20–320)
Microalb Creat Ratio: 30 mcg/mg creat — ABNORMAL HIGH (ref <30)
Microalb, Ur: 6.2 mg/dL

## 2021-09-21 LAB — CBC
HCT: 42.5 % (ref 38.5–50.0)
Hemoglobin: 14.1 g/dL (ref 13.2–17.1)
MCH: 32 pg (ref 27.0–33.0)
MCHC: 33.2 g/dL (ref 32.0–36.0)
MCV: 96.6 fL (ref 80.0–100.0)
MPV: 11.2 fL (ref 7.5–12.5)
Platelets: 172 10*3/uL (ref 140–400)
RBC: 4.4 10*6/uL (ref 4.20–5.80)
RDW: 11.9 % (ref 11.0–15.0)
WBC: 6.1 10*3/uL (ref 3.8–10.8)

## 2021-09-21 LAB — PSA: PSA: 0.66 ng/mL (ref <=4.00)

## 2021-09-21 LAB — LIPID PANEL
Cholesterol: 176 mg/dL (ref <200)
HDL: 43 mg/dL (ref >=40)
LDL Cholesterol (Calc): 94 mg/dL (calc)
Non-HDL Cholesterol (Calc): 133 mg/dL (calc) — ABNORMAL HIGH (ref <130)
Total CHOL/HDL Ratio: 4.1 (calc) (ref <5.0)
Triglycerides: 275 mg/dL — ABNORMAL HIGH (ref <150)

## 2021-09-21 MED ORDER — LISINOPRIL 5 MG PO TABS: 5.0000 mg | ORAL_TABLET | Freq: Every day | ORAL | 1 refills | Status: DC

## 2021-09-21 NOTE — Telephone Encounter (Signed)
-----   Message from [name], NP sent at 09/21/2021 11:37 AM EST ----- ?,Called daughter potassium is slightly elevated.  Try to avoid high potassium containing foods such as banana and green leafy vegetables.  Liver and kidney function is normal.  Cholesterol looks good but triglycerides are slightly elevated however they are improved. Diabetes is affecting his kidneys, we should start him on Lisinopril 5 mg for renal protection. Send in #90, 1 refill. Blood counts and PSA are normal. ?

## 2021-09-21 NOTE — Telephone Encounter (Signed)
Pt's daughter [name] advised.  (On DPR)  RX sent to [facility] in [place].  ? ?Thanks,  ? ?-Mickel Baas  ?

## 2021-10-01 ENCOUNTER — Other Ambulatory Visit: Payer: Self-pay | Admitting: Internal Medicine

## 2021-10-01 NOTE — Telephone Encounter (Signed)
Requested medication (s) are due for refill today: yes ? ?Requested medication (s) are on the active medication list: no ? ?Last refill:  08/10/21 ? ?Future visit scheduled: no ? ?Notes to clinic: rx isn't listed on med list, labs need to be updated ? ? ?  ?Requested Prescriptions  ?Pending Prescriptions Disp Refills  ? furosemide (LASIX) 20 MG tablet [Pharmacy Med Name: FUROSEMIDE 20 MG Tablet] 120 tablet   ?  Sig: TAKE 1 TABLET TWICE DAILY WITH BREAKFAST AND LUNCH (NEED TO SCHEDULE 6 MONTH FOLLOW UP)  ?  ? Cardiovascular:  Diuretics - Loop Failed - 10/01/2021  4:12 AM  ?  ?  Failed - K in normal range and within 180 days  ?  Potassium  ?Date Value Ref Range Status  ?09/20/2021 5.6 (H) 3.5 - 5.3 mmol/L Final  ?  ?  ?  ?  Failed - Mg Level in normal range and within 180 days  ?  Magnesium  ?Date Value Ref Range Status  ?12/25/2019 2.0 1.7 - 2.4 mg/dL Final  ?  Comment:  ?  Performed at [facility] [address], [place], [place] [address]  ?  ?  ?  ?  Failed - Last BP in normal range  ?  BP Readings from Last 1 Encounters:  ?09/20/21 (!) 148/72  ?  ?  ?  ?  Passed - Ca in normal range and within 180 days  ?  Calcium  ?Date Value Ref Range Status  ?09/20/2021 9.9 8.6 - 10.3 mg/dL Final  ?  ?  ?  ?  Passed - Na in normal range and within 180 days  ?  Sodium  ?Date Value Ref Range Status  ?09/20/2021 140 135 - 146 mmol/L Final  ?10/11/2014 141 134 - 144 mmol/L Final  ?  ?  ?  ?  Passed - Cr in normal range and within 180 days  ?  Creat  ?Date Value Ref Range Status  ?09/20/2021 1.13 0.70 - 1.28 mg/dL Final  ? ?Creatinine,U  ?Date Value Ref Range Status  ?03/20/2017 115.3 mg/dL Final  ? ?Creatinine, Urine  ?Date Value Ref Range Status  ?09/20/2021 209 20 - 320 mg/dL Final  ?  ?  ?  ?  Passed - Cl in normal range and within 180 days  ?  Chloride  ?Date Value Ref Range Status  ?09/20/2021 100 98 - 110 mmol/L Final  ?  ?  ?  ?  Passed - Valid encounter within last 6 months  ?  Recent Outpatient Visits   ? ?      ? 1  week ago Encounter for general adult medical examination with abnormal findings  ? [facility] [place], [place] W, NP  ? 3 months ago Aortic atherosclerosis [facility])  ? [facility] [place], [place] W, NP  ? 8 months ago Sacral pain  ? [facility] [place], Coralie Keens, NP  ? ?  ?  ? ?  ?  ?  ? ?

## 2021-10-08 ENCOUNTER — Ambulatory Visit: Payer: Medicare HMO

## 2021-10-08 ENCOUNTER — Ambulatory Visit: Payer: Medicare HMO | Admitting: Podiatry

## 2021-10-08 ENCOUNTER — Other Ambulatory Visit: Payer: Self-pay

## 2021-10-08 DIAGNOSIS — L97522 Non-pressure chronic ulcer of other part of left foot with fat layer exposed: Secondary | ICD-10-CM | POA: Diagnosis not present

## 2021-10-08 DIAGNOSIS — M2141 Flat foot [pes planus] (acquired), right foot: Secondary | ICD-10-CM

## 2021-10-08 DIAGNOSIS — E0843 Diabetes mellitus due to underlying condition with diabetic autonomic (poly)neuropathy: Secondary | ICD-10-CM

## 2021-10-08 NOTE — Progress Notes (Signed)
SITUATION ?Reason for Consult: Evaluation for Prefabricated Diabetic Shoes and Custom Diabetic Inserts. ?Patient / Caregiver Report: Patient would like well fitting shoes ? ?OBJECTIVE DATA: ?Patient History / Diagnosis:  ?  ICD-10-CM   ?1. Diabetes mellitus due to underlying condition with diabetic autonomic neuropathy, unspecified whether long term insulin use (HCC)  E08.43   ?  ?2. Pes planus of both feet  M21.41   ? M21.42   ?  ?3. Ulcer of left foot with fat layer exposed ([place])  L97.522   ?  ? ?Treating Diabetic Physician:  Nobie Putnam, DO ? ?Current or Previous Devices:   Current user ? ?In-Person Foot Examination: ?Ulcers & Callousing:   Left hallux ?Deformities:    Pes planus bilateral ?Sensation:    compromised  ?Shoe Size:     11.5XW ? ?ORTHOTIC RECOMMENDATION ?Recommended Devices: ?- 1x pair prefabricated PDAC approved diabetic shoes; Patient Selected Orthofeet Sprint Black 675 Size 11.5XW ?- 3x pair custom-to-patient PDAC approved vacuum formed diabetic insoles. ? ?GOALS OF SHOES AND INSOLES ?- Reduce shear and pressure ?- Reduce / Prevent callus formation ?- Reduce / Prevent ulceration ?- Protect the fragile healing compromised diabetic foot. ? ?Patient would benefit from diabetic shoes and inserts as patient has diabetes mellitus and the patient has one or more of the following conditions: ?- History of previous foot ulceration. ?- History of pre-ulcerative callus ?- Peripheral neuropathy with evidence of callus formation ?- Foot deformity ?- Poor circulation ? ?ACTIONS PERFORMED ?Potential out of pocket cost was communicated to patient. Patient understood and consented to measurement and casting. Patient was casted for insoles via crush box and measured for shoes via brannock device. Procedure was explained and patient tolerated procedure well. All questions were answered and concerns addressed. Casts were shipped to central fabrication for HOLD until Certificate of Medical Necessity or  otherwise necessary authorization from insurance is obtained. ? ?PLAN ?Shoes are to be ordered and casts released from hold once all appropriate paperwork is complete. Patient is to be contacted and scheduled for fitting once shoes and insoles have been fabricated and received. ? ?

## 2021-10-08 NOTE — Progress Notes (Signed)
? ?Subjective:  ?76 y.o. male with PMHx of diabetes mellitus presenting today for follow-up evaluation of an ulcer to the right great toe.  Patient states that he is doing much better.  They have been applying the antibiotic cream and a dressing.  He is also been in the surgical shoe.  He states that today to have an appointment with Pedorthist for new diabetic shoes and insoles ? ? ?Past Medical History:  ?Diagnosis Date  ? Arthritis   ? COPD (chronic obstructive pulmonary disease) ([place])   ? Depression   ? Dyspnea   ? Erectile dysfunction   ? He's had a hx of   ? Hypercholesterolemia   ? Hypertension   ? Essential  ? Lumbosacral disc disease   ? with chronic low back pain, has implanted nerve stimulator  ? Peripheral neuropathy   ? Presenile dementia   ? Type 2 diabetes mellitus ([place]) 2010  ? takes oral meds  ? ?Past Surgical History:  ?Procedure Laterality Date  ? ANTERIOR CERVICAL DECOMP/DISCECTOMY FUSION    ? APPENDECTOMY    ? HEMORRHOID SURGERY    ? x2  ? JOINT REPLACEMENT Left   ? 2nd one was to change siZe  ? KNEE SURGERY    ? Previous left total replacement  ? KYPHOPLASTY N/A 12/10/2012  ? Procedure: Lumbar one Kyphoplasty;  Surgeon: Kristeen Miss, MD;  Location: [place] [facility];  Service: Neurosurgery;  Laterality: N/A;  Lumbar One Kyphoplasty  ? LUMBAR LAMINECTOMY/DECOMPRESSION MICRODISCECTOMY Bilateral 01/25/2021  ? Procedure: Bilateral Lumbar 4-5, Lumbar 5 Sacral 1 Laminectomy/Foraminotomy;  Surgeon: Erline Levine, MD;  Location: [facility];  Service: Neurosurgery;  Laterality: Bilateral;  3C/RM 21  ? OTHER SURGICAL HISTORY  1992  ? Bone Fusion in Right Foot  ? SPINAL CORD STIMULATOR INSERTION N/A 12/02/2014  ? Procedure: THORACIC SPINAL CORD STIMULATOR INSERTION INFINION BOSTON [employer]ENTIFIC 16 LEAD WITH LAMINECTOMY;  Surgeon: Erline Levine, MD;  Location: [place] [facility];  Service: Neurosurgery;  Laterality: N/A;  THORACIC SPINAL CORD STIMULATOR INSERTION INFINION BOSTON [employer]IFIC 16 LEAD WITH LAMINECTOMY  ? SPINAL CORD  STIMULATOR REMOVAL N/A 10/19/2020  ? Procedure: Removal of spinal cord stimulator with paddle lead;  Surgeon: Erline Levine, MD;  Location: [facility];  Service: Neurosurgery;  Laterality: N/A;  ? TOTAL KNEE ARTHROPLASTY Left   ? ?Allergies  ?Allergen Reactions  ? Codeine Hives and Itching  ? ?Objective/Physical Exam ?General: The patient is alert and oriented x3 in no acute distress. ? ?Dermatology:  ?Wound #1 noted to the plantar aspect of the left great toe measuring approximately 0.5x1.5 x 0.2 cm (LxWxD).  Overall there is significant improvement ? ?Wound #2 noted to the dorsum of the left foot has healed.  Complete reepithelialization has occurred ? ?To the noted ulceration(s), there is no eschar. There is a moderate amount of slough, fibrin, and necrotic tissue noted. Granulation tissue and wound base is red. There is a minimal amount of serosanguineous drainage noted. There is no exposed bone muscle-tendon ligament or joint. There is no malodor. Periwound integrity is intact. ?Skin is warm, dry and supple bilateral lower extremities. ? ?Vascular: VAS [place] ABI WITH/WO TBI 09/14/2021 ?Summary:  ?Right: Resting right ankle-brachial index is within normal range. No  ?evidence of significant right lower extremity arterial disease. The right  ?toe-brachial index is normal.  ? ?Left: Resting left ankle-brachial index is within normal range. No  ?evidence of significant left lower extremity arterial disease. The left  ?toe-brachial index is normal.  ? ?Neurological: Epicritic  and protective threshold diminished bilaterally.  ? ?Musculoskeletal Exam: Range of motion within normal limits to all pedal and ankle joints bilateral. Muscle strength 5/5 in all groups bilateral.  ? ?Radiographic exam LT foot 09/04/2021: Diffuse degenerative changes noted throughout the foot.  No fractures identified.  Normal osseous mineralization.  Absence of the fifth metatarsal head noted from prior surgery. ? ?Assessment: ?1.  Ulcer left great toe  and left dorsal foot secondary to diabetes mellitus ?2. diabetes mellitus w/ peripheral neuropathy ?3.  Cellulitis left foot; resolved ? ? ?Plan of Care:  ?1. Patient was evaluated.  Vascular results were reviewed today ?2. medically necessary excisional debridement including subcutaneous tissue was performed using a tissue nipper and a chisel blade. Excisional debridement of all the necrotic nonviable tissue down to healthy bleeding viable tissue was performed with post-debridement measurements same as pre-. ?3. the wound was cleansed and dry sterile dressing applied. ?4.  Continue gentamicin cream daily with a light dressing ?5.  Continue postsurgical shoe.  Offloading felt dancers pad was applied to the insole to offload pressure from the great toe ?6.  Patient has an appointment today with the Pedorthist to be molded for custom diabetic shoes and insoles ? ? ?Edrick Kins, DPM ?[place] ? ?Dr. Edrick Kins, DPM  ?  ?[address]                                      ?[place], [place] [address]                ?Office [phone]  ?Fax [phone] ? ? ? ? ?

## 2021-10-14 ENCOUNTER — Other Ambulatory Visit: Payer: Self-pay | Admitting: Internal Medicine

## 2021-10-14 DIAGNOSIS — G20 Parkinson's disease: Secondary | ICD-10-CM

## 2021-10-16 NOTE — Telephone Encounter (Signed)
Requested Prescriptions  ?Pending Prescriptions Disp Refills  ?? carbidopa-levodopa (SINEMET IR) 25-100 MG tablet [Pharmacy Med Name: CARBIDOPA/LEVODOPA 25-100 MG Tablet] 270 tablet   ?  Sig: TAKE 1 TABLET THREE TIMES DAILY (AT 7AM, 11AM, 4PM)  ?  ? Neurology:  Parkinsonian Agents 2 Failed - 10/14/2021  4:45 AM  ?  ?  Failed - ALT in normal range and within 360 days  ?  ALT  ?Date Value Ref Range Status  ?09/20/2021 3 (L) 9 - 46 U/L Final  ?   ?  ?  Failed - Last BP in normal range  ?  BP Readings from Last 1 Encounters:  ?09/20/21 (!) 148/72  ?   ?  ?  Passed - WBC in normal range and within 360 days  ?  WBC  ?Date Value Ref Range Status  ?09/20/2021 6.1 3.8 - 10.8 Thousand/uL Final  ?   ?  ?  Passed - PLT in normal range and within 360 days  ?  Platelets  ?Date Value Ref Range Status  ?09/20/2021 172 140 - 400 Thousand/uL Final  ?   ?  ?  Passed - HGB in normal range and within 360 days  ?  Hemoglobin  ?Date Value Ref Range Status  ?09/20/2021 14.1 13.2 - 17.1 g/dL Final  ?   ?  ?  Passed - HCT in normal range and within 360 days  ?  HCT  ?Date Value Ref Range Status  ?09/20/2021 42.5 38.5 - 50.0 % Final  ?   ?  ?  Passed - Cr in normal range and within 360 days  ?  Creat  ?Date Value Ref Range Status  ?09/20/2021 1.13 0.70 - 1.28 mg/dL Final  ? ?Creatinine,U  ?Date Value Ref Range Status  ?03/20/2017 115.3 mg/dL Final  ? ?Creatinine, Urine  ?Date Value Ref Range Status  ?09/20/2021 209 20 - 320 mg/dL Final  ?   ?  ?  Passed - AST in normal range and within 360 days  ?  AST  ?Date Value Ref Range Status  ?09/20/2021 11 10 - 35 U/L Final  ?   ?  ?  Passed - Valid encounter within last 12 months  ?  Recent Outpatient Visits   ?      ? 3 weeks ago Encounter for general adult medical examination with abnormal findings  ? [facility] [place], [place] W, NP  ? 3 months ago Aortic atherosclerosis [facility])  ? [facility] [place], [place] W, NP  ? 9 months ago Sacral pain  ? [facility] [place], [place] W, NP  ?  ?  ? ?  ?  ?  ?? meloxicam (MOBIC) 15 MG tablet [Pharmacy Med Name: MELOXICAM 15 MG Tablet] 90 tablet 3  ?  Sig: TAKE 1 TABLET EVERY DAY WITH SUPPER  ?  ? Analgesics:  COX2 Inhibitors Failed - 10/14/2021  4:45 AM  ?  ?  Failed - Manual Review: Labs are only required if the patient has taken medication for more than 8 weeks.  ?  ?  Failed - ALT in normal range and within 360 days  ?  ALT  ?Date Value Ref Range Status  ?09/20/2021 3 (L) 9 - 46 U/L Final  ?   ?  ?  Passed - HGB in normal range and within 360 days  ?  Hemoglobin  ?Date Value Ref Range Status  ?09/20/2021 14.1 13.2 - 17.1 g/dL Final  ?   ?  ?  Passed - Cr in normal range and within 360 days  ?  Creat  ?Date Value Ref Range Status  ?09/20/2021 1.13 0.70 - 1.28 mg/dL Final  ? ?Creatinine,U  ?Date Value Ref Range Status  ?03/20/2017 115.3 mg/dL Final  ? ?Creatinine, Urine  ?Date Value Ref Range Status  ?09/20/2021 209 20 - 320 mg/dL Final  ?   ?  ?  Passed - HCT in normal range and within 360 days  ?  HCT  ?Date Value Ref Range Status  ?09/20/2021 42.5 38.5 - 50.0 % Final  ?   ?  ?  Passed - AST in normal range and within 360 days  ?  AST  ?Date Value Ref Range Status  ?09/20/2021 11 10 - 35 U/L Final  ?   ?  ?  Passed - eGFR is 30 or above and within 360 days  ?  GFR calc Af Amer  ?Date Value Ref Range Status  ?12/25/2019 >60 >60 mL/min Final  ? ?GFR, Estimated  ?Date Value Ref Range Status  ?01/23/2021 >60 >60 mL/min Final  ?  Comment:  ?  (NOTE) ?Calculated using the CKD-EPI Creatinine Equation (2021) ?  ? ?GFR  ?Date Value Ref Range Status  ?01/06/2020 59.40 (L) >60.00 mL/min Final  ? ?eGFR  ?Date Value Ref Range Status  ?09/20/2021 67 > OR = 60 mL/min/1.24m Final  ?  Comment:  ?  The eGFR is based on the CKD-EPI 2021 equation. To calculate  ?the new eGFR from a previous Creatinine or Cystatin C ?result, go to https://www.kidney.org/professionals/ ?kdoqi/gfr%5Fcalculator ?  ?   ?  ?  Passed - Patient is not pregnant  ?  ?   Passed - Valid encounter within last 12 months  ?  Recent Outpatient Visits   ?      ? 3 weeks ago Encounter for general adult medical examination with abnormal findings  ? [facility] [place]  ? 3 months ago [facility]ortic atherosclerosis ([facility]  ? [facility]  ? 9 months ago Sacral pain  ? [facility]  ?  ?  ? ?  ?  ?  ? ?Spoke with pharmacy, pt out of Mobic refills ?

## 2021-10-16 NOTE — Telephone Encounter (Signed)
Requested medication (s) are due for refill today: yes ? ?Requested medication (s) are on the active medication list: yes   ? ?Last refill: 06/29/21  #405  0 refils ? ?Future visit scheduled no ? ?Notes to clinic:Historical Provider, please review. Thank you ? ?Requested Prescriptions  ?Pending Prescriptions Disp Refills  ? carbidopa-levodopa (SINEMET IR) 25-100 MG tablet [Pharmacy Med Name: CARBIDOPA/LEVODOPA 25-100 MG Tablet] 270 tablet   ?  Sig: TAKE 1 TABLET THREE TIMES DAILY (AT 7AM, 11AM, 4PM)  ?  ? Neurology:  Parkinsonian Agents 2 Failed - 10/14/2021  4:45 AM  ?  ?  Failed - ALT in normal range and within 360 days  ?  ALT  ?Date Value Ref Range Status  ?09/20/2021 3 (L) 9 - 46 U/L Final  ?  ?  ?  ?  Failed - Last BP in normal range  ?  BP Readings from Last 1 Encounters:  ?09/20/21 (!) 148/72  ?  ?  ?  ?  Passed - WBC in normal range and within 360 days  ?  WBC  ?Date Value Ref Range Status  ?09/20/2021 6.1 3.8 - 10.8 Thousand/uL Final  ?  ?  ?  ?  Passed - PLT in normal range and within 360 days  ?  Platelets  ?Date Value Ref Range Status  ?09/20/2021 172 140 - 400 Thousand/uL Final  ?  ?  ?  ?  Passed - HGB in normal range and within 360 days  ?  Hemoglobin  ?Date Value Ref Range Status  ?09/20/2021 14.1 13.2 - 17.1 g/dL Final  ?  ?  ?  ?  Passed - HCT in normal range and within 360 days  ?  HCT  ?Date Value Ref Range Status  ?09/20/2021 42.5 38.5 - 50.0 % Final  ?  ?  ?  ?  Passed - Cr in normal range and within 360 days  ?  Creat  ?Date Value Ref Range Status  ?09/20/2021 1.13 0.70 - 1.28 mg/dL Final  ? ?Creatinine,U  ?Date Value Ref Range Status  ?03/20/2017 115.3 mg/dL Final  ? ?Creatinine, Urine  ?Date Value Ref Range Status  ?09/20/2021 209 20 - 320 mg/dL Final  ?  ?  ?  ?  Passed - AST in normal range and within 360 days  ?  AST  ?Date Value Ref Range Status  ?09/20/2021 11 10 - 35 U/L Final  ?  ?  ?  ?  Passed - Valid encounter within last 12 months  ?  Recent Outpatient Visits   ? ?      ? 3 weeks  ago Encounter for general adult medical examination with abnormal findings  ? [facility] [place], [place] W, NP  ? 3 months ago Aortic atherosclerosis [facility])  ? [facility] [place], [place] W, NP  ? 9 months ago Sacral pain  ? [facility] [place], Coralie Keens, NP  ? ?  ?  ? ?  ?  ?  ?Signed Prescriptions Disp Refills  ? meloxicam (MOBIC) 15 MG tablet 90 tablet 1  ?  Sig: TAKE 1 TABLET EVERY DAY WITH SUPPER  ?  ? Analgesics:  COX2 Inhibitors Failed - 10/14/2021  4:45 AM  ?  ?  Failed - Manual Review: Labs are only required if the patient has taken medication for more than 8 weeks.  ?  ?  Failed - ALT in normal range and within 360 days  ?  ALT  ?Date Value Ref Range Status  ?09/20/2021 3 (L) 9 - 46 U/L Final  ?  ?  ?  ?  Passed - HGB in normal range and within 360 days  ?  Hemoglobin  ?Date Value Ref Range Status  ?09/20/2021 14.1 13.2 - 17.1 g/dL Final  ?  ?  ?  ?  Passed - Cr in normal range and within 360 days  ?  Creat  ?Date Value Ref Range Status  ?09/20/2021 1.13 0.70 - 1.28 mg/dL Final  ? ?Creatinine,U  ?Date Value Ref Range Status  ?03/20/2017 115.3 mg/dL Final  ? ?Creatinine, Urine  ?Date Value Ref Range Status  ?09/20/2021 209 20 - 320 mg/dL Final  ?  ?  ?  ?  Passed - HCT in normal range and within 360 days  ?  HCT  ?Date Value Ref Range Status  ?09/20/2021 42.5 38.5 - 50.0 % Final  ?  ?  ?  ?  Passed - AST in normal range and within 360 days  ?  AST  ?Date Value Ref Range Status  ?09/20/2021 11 10 - 35 U/L Final  ?  ?  ?  ?  Passed - eGFR is 30 or above and within 360 days  ?  GFR calc Af Amer  ?Date Value Ref Range Status  ?12/25/2019 >60 >60 mL/min Final  ? ?GFR, Estimated  ?Date Value Ref Range Status  ?01/23/2021 >60 >60 mL/min Final  ?  Comment:  ?  (NOTE) ?Calculated using the CKD-EPI Creatinine Equation (2021) ?  ? ?GFR  ?Date Value Ref Range Status  ?01/06/2020 59.40 (L) >60.00 mL/min Final  ? ?eGFR  ?Date Value Ref Range Status  ?09/20/2021 67 > OR = 60  mL/min/1.108m Final  ?  Comment:  ?  The eGFR is based on the CKD-EPI 2021 equation. To calculate  ?the new eGFR from a previous Creatinine or Cystatin C ?result, go to https://www.kidney.org/professionals/ ?kdoqi/gfr%5Fcalculator ?  ?  ?  ?  ?  Passed - Patient is not pregnant  ?  ?  Passed - Valid encounter within last 12 months  ?  Recent Outpatient Visits   ? ?      ? 3 weeks ago Encounter for general adult medical examination with abnormal findings  ? [facility]  ? 3 months ago Aortic atherosclerosis ([facility]  ? [facility]  ? 9 months ago Sacral pain  ? [facility]  ? ?  ?  ? ?  ?  ?  ? ? ? ? ?

## 2021-10-22 ENCOUNTER — Other Ambulatory Visit: Payer: Self-pay | Admitting: Internal Medicine

## 2021-10-23 NOTE — Telephone Encounter (Signed)
Requested medication (s) are due for refill today: yes ? ?Requested medication (s) are on the active medication list: yes ? ?Last refill:  09/13/21 #45/0 ? ?Future visit scheduled: no ? ?Notes to clinic:  Unable to refill per protocol, cannot delegate. ? ?  ?Requested Prescriptions  ?Pending Prescriptions Disp Refills  ? LORazepam (ATIVAN) 0.5 MG tablet [Pharmacy Med Name: LORAZEPAM 0.5 MG TABLET] 45 tablet 0  ?  Sig: TAKE 1 TO 2 TABLETS BY MOUTH TWICE A DAY AS NEEDED  ?  ? Not Delegated - Psychiatry: Anxiolytics/Hypnotics 2 Failed - 10/22/2021 10:03 AM  ?  ?  Failed - This refill cannot be delegated  ?  ?  Failed - Urine Drug Screen completed in last 360 days  ?  ?  Passed - Patient is not pregnant  ?  ?  Passed - Valid encounter within last 6 months  ?  Recent Outpatient Visits   ? ?      ? 1 month ago Encounter for general adult medical examination with abnormal findings  ? [facility] [place], [place] W, NP  ? 4 months ago Aortic atherosclerosis [facility])  ? [facility] [place], [place] W, NP  ? 9 months ago Sacral pain  ? [facility] [place], Coralie Keens, NP  ? ?  ?  ? ?  ?  ?  ? ?

## 2021-11-05 ENCOUNTER — Ambulatory Visit (INDEPENDENT_AMBULATORY_CARE_PROVIDER_SITE_OTHER): Payer: Medicare HMO

## 2021-11-05 ENCOUNTER — Ambulatory Visit: Payer: Medicare HMO | Admitting: Podiatry

## 2021-11-05 DIAGNOSIS — L97522 Non-pressure chronic ulcer of other part of left foot with fat layer exposed: Secondary | ICD-10-CM

## 2021-11-05 NOTE — Progress Notes (Signed)
? ?Subjective:  ?76 y.o. male with PMHx of diabetes mellitus presenting today for follow-up evaluation of an ulcer to the right great toe.  Patient was fitted for custom molded orthotics and insoles and they are pending dispensable.  He has been applying the antibiotic cream and a light dressing daily and wearing surgical shoe.  He says that the pain has improved significantly. ? ? ?Past Medical History:  ?Diagnosis Date  ? Arthritis   ? COPD (chronic obstructive pulmonary disease) ([place])   ? Depression   ? Dyspnea   ? Erectile dysfunction   ? He's had a hx of   ? Hypercholesterolemia   ? Hypertension   ? Essential  ? Lumbosacral disc disease   ? with chronic low back pain, has implanted nerve stimulator  ? Peripheral neuropathy   ? Presenile dementia   ? Type 2 diabetes mellitus ([place]) 2010  ? takes oral meds  ? ?Past Surgical History:  ?Procedure Laterality Date  ? ANTERIOR CERVICAL DECOMP/DISCECTOMY FUSION    ? APPENDECTOMY    ? HEMORRHOID SURGERY    ? x2  ? JOINT REPLACEMENT Left   ? 2nd one was to change siZe  ? KNEE SURGERY    ? Previous left total replacement  ? KYPHOPLASTY N/A 12/10/2012  ? Procedure: Lumbar one Kyphoplasty;  Surgeon: Kristeen Miss, MD;  Location: [place] [facility];  Service: Neurosurgery;  Laterality: N/A;  Lumbar One Kyphoplasty  ? LUMBAR LAMINECTOMY/DECOMPRESSION MICRODISCECTOMY Bilateral 01/25/2021  ? Procedure: Bilateral Lumbar 4-5, Lumbar 5 Sacral 1 Laminectomy/Foraminotomy;  Surgeon: Erline Levine, MD;  Location: [place];  Service: Neurosurgery;  Laterality: Bilateral;  3C/RM 21  ? OTHER SURGICAL HISTORY  1992  ? Bone Fusion in Right Foot  ? SPINAL CORD STIMULATOR INSERTION N/A 12/02/2014  ? Procedure: THORACIC SPINAL CORD STIMULATOR INSERTION INFINION [employer] 16 LEAD WITH LAMINECTOMY;  Surgeon: Erline Levine, MD;  Location: [place] [facility];  Service: Neurosurgery;  Laterality: N/A;  THORACIC SPINAL CORD STIMULATOR INSERTION INFINION [employer]OST[employer] 16 LEAD WITH LAMINECTOMY  ? SPINAL CORD  STIMULATOR REMOVAL N/A 10/19/2020  ? Procedure: Removal of spinal cord stimulator with paddle lead;  Surgeon: Erline Levine, MD;  Location: [place];  Service: Neurosurgery;  Laterality: N/A;  ? TOTAL KNEE ARTHROPLASTY Left   ? ?Allergies  ?Allergen Reactions  ? Codeine Hives and Itching  ? ? ?Objective/Physical Exam ?General: The patient is alert and oriented x3 in no acute distress. ? ?Dermatology:  ?Wound #1 noted to the plantar aspect of the left great toe appears stable with steady improvement.  It measures approximately 0.5 x 1.0 x 0.2 cm.  Please see above noted photo ?To the noted ulceration(s), there is no eschar. There is a moderate amount of slough, fibrin, and necrotic tissue noted. Granulation tissue and wound base is red. There is a minimal amount of serosanguineous drainage noted. There is no exposed bone muscle-tendon ligament or joint. There is no malodor. Periwound integrity is intact. ?Skin is warm, dry and supple bilateral lower extremities. ? ?Vascular: VAS [place] ABI WITH/WO TBI 09/14/2021 ?Summary:  ?Right: Resting right ankle-brachial index is within normal range. No  ?evidence of significant right lower extremity arterial disease. The right  ?toe-brachial index is normal.  ? ?Left: Resting left ankle-brachial index is within normal range. No  ?evidence of significant left lower extremity arterial disease. The left  ?toe-brachial index is normal.  ? ?Neurological: Epicritic and protective threshold diminished bilaterally.  ? ?Musculoskeletal Exam: Range of motion within normal limits  to all pedal and ankle joints bilateral. Muscle strength 5/5 in all groups bilateral.  ? ?Radiographic exam LT foot 09/04/2021: Diffuse degenerative changes noted throughout the foot.  No fractures identified.  Normal osseous mineralization.  Absence of the fifth metatarsal head noted from prior surgery. ? ?Assessment: ?1.  Ulcer left great toe secondary to diabetes mellitus ?2. diabetes mellitus w/ peripheral  neuropathy ? ?Plan of Care:  ?1. Patient was evaluated.   ?2. medically necessary excisional debridement including subcutaneous tissue was performed using a tissue nipper and a chisel blade. Excisional debridement of all the necrotic nonviable tissue down to healthy bleeding viable tissue was performed with post-debridement measurements same as pre-. ?3. the wound was cleansed and dry sterile dressing applied. ?4.  Continue gentamicin cream daily with a light dressing ?5.  Continue postsurgical shoe with offloading felt dancers pad to the insole to offload pressure from the great toe ?6.  Custom diabetic shoes and insoles pending.  The patient was already molded ?7.  Return to clinic 4 weeks ? ? ?Edrick Kins, DPM ?[place] ? ?Dr. Edrick Kins, DPM  ?  ?[address]                                      ?[place], [place] [address]                ?Office [phone]  ?Fax [phone] ? ? ? ? ?

## 2021-11-23 ENCOUNTER — Other Ambulatory Visit: Payer: Self-pay | Admitting: Internal Medicine

## 2021-11-23 DIAGNOSIS — F03918 Unspecified dementia, unspecified severity, with other behavioral disturbance: Secondary | ICD-10-CM

## 2021-11-23 DIAGNOSIS — G4700 Insomnia, unspecified: Secondary | ICD-10-CM

## 2021-11-23 NOTE — Telephone Encounter (Signed)
Requested Prescriptions  ?Pending Prescriptions Disp Refills  ?? memantine (NAMENDA XR) 7 MG CP24 24 hr capsule [Pharmacy Med Name: MEMANTINE HCL ER 7 MG CAPSULE] 90 capsule 0  ?  Sig: TAKE 1 CAPSULE (7 MG TOTAL) BY MOUTH EVERY MORNING.  ?  ? Neurology:  Alzheimer's Agents 2 Passed - 11/23/2021  2:30 AM  ?  ?  Passed - Cr in normal range and within 360 days  ?  Creat  ?Date Value Ref Range Status  ?09/20/2021 1.13 0.70 - 1.28 mg/dL Final  ? ?Creatinine,U  ?Date Value Ref Range Status  ?03/20/2017 115.3 mg/dL Final  ? ?Creatinine, Urine  ?Date Value Ref Range Status  ?09/20/2021 209 20 - 320 mg/dL Final  ?   ?  ?  Passed - eGFR is 5 or above and within 360 days  ?  GFR calc Af Amer  ?Date Value Ref Range Status  ?12/25/2019 >60 >60 mL/min Final  ? ?GFR, Estimated  ?Date Value Ref Range Status  ?01/23/2021 >60 >60 mL/min Final  ?  Comment:  ?  (NOTE) ?Calculated using the CKD-EPI Creatinine Equation (2021) ?  ? ?GFR  ?Date Value Ref Range Status  ?01/06/2020 59.40 (L) >60.00 mL/min Final  ? ?eGFR  ?Date Value Ref Range Status  ?09/20/2021 67 > OR = 60 mL/min/1.48m Final  ?  Comment:  ?  The eGFR is based on the CKD-EPI 2021 equation. To calculate  ?the new eGFR from a previous Creatinine or Cystatin C ?result, go to https://www.kidney.org/professionals/ ?kdoqi/gfr%5Fcalculator ?  ?   ?  ?  Passed - Valid encounter within last 6 months  ?  Recent Outpatient Visits   ?      ? 2 months ago Encounter for general adult medical examination with abnormal findings  ? [facility] [facility]  ? 5 months ago Aortic atherosclerosis ([place]  ? [facility]  ? 10 months ago Sacral pain  ? [facility]  ?  ?  ? ?  ?  ?  ? ? ?

## 2021-11-24 ENCOUNTER — Other Ambulatory Visit: Payer: Self-pay | Admitting: Internal Medicine

## 2021-11-26 NOTE — Telephone Encounter (Signed)
Requested medication (s) are due for refill today- expired Rx ? ?Requested medication (s) are on the active medication list -yes ? ?Future visit scheduled -no ? ?Last refill: 11/01/20 for both- 1 year supply ? ?Notes to clinic: Request RF: expired Rx ? ?Requested Prescriptions  ?Pending Prescriptions Disp Refills  ? metFORMIN (GLUCOPHAGE) 500 MG tablet [Pharmacy Med Name: METFORMIN HYDROCHLORIDE 500 MG Tablet] 180 tablet 3  ?  Sig: TAKE 1 TABLET TWICE DAILY WITH LUNCH AND WITH SUPPER (PER ADMIN INSTRUCTIONS)  ?  ? Endocrinology:  Diabetes - Biguanides Failed - 11/24/2021  2:50 AM  ?  ?  Failed - CBC within normal limits and completed in the last 12 months  ?  WBC  ?Date Value Ref Range Status  ?09/20/2021 6.1 3.8 - 10.8 Thousand/uL Final  ? ?RBC  ?Date Value Ref Range Status  ?09/20/2021 4.40 4.20 - 5.80 Million/uL Final  ? ?Hemoglobin  ?Date Value Ref Range Status  ?09/20/2021 14.1 13.2 - 17.1 g/dL Final  ? ?HCT  ?Date Value Ref Range Status  ?09/20/2021 42.5 38.5 - 50.0 % Final  ? ?MCHC  ?Date Value Ref Range Status  ?09/20/2021 33.2 32.0 - 36.0 g/dL Final  ? ?MCH  ?Date Value Ref Range Status  ?09/20/2021 32.0 27.0 - 33.0 pg Final  ? ?MCV  ?Date Value Ref Range Status  ?09/20/2021 96.6 80.0 - 100.0 fL Final  ? ?No results found for: PLTCOUNTKUC, LABPLAT, [place] ?RDW  ?Date Value Ref Range Status  ?09/20/2021 11.9 11.0 - 15.0 % Final  ?10/11/2014 13.5 12.3 - 15.4 % Final  ? ?  ?  ?  Passed - Cr in normal range and within 360 days  ?  Creat  ?Date Value Ref Range Status  ?09/20/2021 1.13 0.70 - 1.28 mg/dL Final  ? ?Creatinine,U  ?Date Value Ref Range Status  ?03/20/2017 115.3 mg/dL Final  ? ?Creatinine, Urine  ?Date Value Ref Range Status  ?09/20/2021 209 20 - 320 mg/dL Final  ?  ?  ?  ?  Passed - HBA1C is between 0 and 7.9 and within 180 days  ?  Hemoglobin A1C  ?Date Value Ref Range Status  ?09/20/2021 7.4 (A) 4.0 - 5.6 % Final  ? ?Hgb A1c MFr Bld  ?Date Value Ref Range Status  ?01/23/2021 7.0 (H) 4.8 - 5.6 % Final   ?  Comment:  ?  (NOTE) ?        Prediabetes: 5.7 - 6.4 ?        Diabetes: >6.4 ?        Glycemic control for adults with diabetes: <7.0 ?  ?  ?  ?  ?  Passed - eGFR in normal range and within 360 days  ?  GFR calc Af Amer  ?Date Value Ref Range Status  ?12/25/2019 >60 >60 mL/min Final  ? ?GFR, Estimated  ?Date Value Ref Range Status  ?01/23/2021 >60 >60 mL/min Final  ?  Comment:  ?  (NOTE) ?Calculated using the CKD-EPI Creatinine Equation (2021) ?  ? ?GFR  ?Date Value Ref Range Status  ?01/06/2020 59.40 (L) >60.00 mL/min Final  ? ?eGFR  ?Date Value Ref Range Status  ?09/20/2021 67 > OR = 60 mL/min/1.65m Final  ?  Comment:  ?  The eGFR is based on the CKD-EPI 2021 equation. To calculate  ?the new eGFR from a previous Creatinine or Cystatin C ?result, go to https://www.kidney.org/professionals/ ?kdoqi/gfr%5Fcalculator ?  ?  ?  ?  ?  Passed - B12 Level  in normal range and within 720 days  ?  Vitamin B-12  ?Date Value Ref Range Status  ?12/21/2019 325 180 - 914 pg/mL Final  ?  Comment:  ?  (NOTE) ?This assay is not validated for testing neonatal or ?myeloproliferative syndrome specimens for Vitamin B12 levels. ?Performed at [facility], [place] [address] [place], [place] ?[address] ?  ?  ?  ?  ?  Passed - Valid encounter within last 6 months  ?  Recent Outpatient Visits   ? ?      ? 2 months ago Encounter for general adult medical examination with abnormal findings  ? [facility] [place], [place] W, NP  ? 5 months ago Aortic atherosclerosis [facility])  ? [facility] [place], [place] W, NP  ? 10 months ago Sacral pain  ? [facility] [place], [place] W, NP  ? ?  ?  ? ? ?  ?  ?  ? mirtazapine (REMERON) 45 MG tablet [Pharmacy Med Name: MIRTAZAPINE 45 MG Tablet] 90 tablet 3  ?  Sig: TAKE 1 TABLET AT BEDTIME  ?  ? Psychiatry: Antidepressants - mirtazapine Passed - 11/24/2021  2:50 AM  ?  ?  Passed - Completed PHQ-2 or PHQ-9 in the last 360 days  ?  ?  Passed - Valid encounter within last  6 months  ?  Recent Outpatient Visits   ? ?      ? 2 months ago Encounter for general adult medical examination with abnormal findings  ? [facility] [place], [place] W, NP  ? 5 months ago Aortic atherosclerosis [facility])  ? [facility] [place], [place] W, NP  ? 10 months ago Sacral pain  ? [facility] [place], Coralie Keens, NP  ? ?  ?  ? ? ?  ?  ?  ? ? ? ?Requested Prescriptions  ?Pending Prescriptions Disp Refills  ? metFORMIN (GLUCOPHAGE) 500 MG tablet [Pharmacy Med Name: METFORMIN HYDROCHLORIDE 500 MG Tablet] 180 tablet 3  ?  Sig: TAKE 1 TABLET TWICE DAILY WITH LUNCH AND WITH SUPPER (PER ADMIN INSTRUCTIONS)  ?  ? Endocrinology:  Diabetes - Biguanides Failed - 11/24/2021  2:50 AM  ?  ?  Failed - CBC within normal limits and completed in the last 12 months  ?  WBC  ?Date Value Ref Range Status  ?09/20/2021 6.1 3.8 - 10.8 Thousand/uL Final  ? ?RBC  ?Date Value Ref Range Status  ?09/20/2021 4.40 4.20 - 5.80 Million/uL Final  ? ?Hemoglobin  ?Date Value Ref Range Status  ?09/20/2021 14.1 13.2 - 17.1 g/dL Final  ? ?HCT  ?Date Value Ref Range Status  ?09/20/2021 42.5 38.5 - 50.0 % Final  ? ?MCHC  ?Date Value Ref Range Status  ?09/20/2021 33.2 32.0 - 36.0 g/dL Final  ? ?MCH  ?Date Value Ref Range Status  ?09/20/2021 32.0 27.0 - 33.0 pg Final  ? ?MCV  ?Date Value Ref Range Status  ?09/20/2021 96.6 80.0 - 100.0 fL Final  ? ?No results found for: PLTCOUNTKUC, LABPLAT, [place] ?RDW  ?Date Value Ref Range Status  ?09/20/2021 11.9 11.0 - 15.0 % Final  ?10/11/2014 13.5 12.3 - 15.4 % Final  ? ?  ?  ?  Passed - Cr in normal range and within 360 days  ?  Creat  ?Date Value Ref Range Status  ?09/20/2021 1.13 0.70 - 1.28 mg/dL Final  ? ?Creatinine,U  ?Date Value Ref Range Status  ?  03/20/2017 115.3 mg/dL Final  ? ?Creatinine, Urine  ?Date Value Ref Range Status  ?09/20/2021 209 20 - 320 mg/dL Final  ?  ?  ?  ?  Passed - HBA1C is between 0 and 7.9 and within 180 days  ?  Hemoglobin A1C  ?Date Value Ref  Range Status  ?09/20/2021 7.4 (A) 4.0 - 5.6 % Final  ? ?Hgb A1c MFr Bld  ?Date Value Ref Range Status  ?01/23/2021 7.0 (H) 4.8 - 5.6 % Final  ?  Comment:  ?  (NOTE) ?        Prediabetes: 5.7 - 6.4 ?        Diabetes: >6.4 ?        Glycemic control for adults with diabetes: <7.0 ?  ?  ?  ?  ?  Passed - eGFR in normal range and within 360 days  ?  GFR calc Af Amer  ?Date Value Ref Range Status  ?12/25/2019 >60 >60 mL/min Final  ? ?GFR, Estimated  ?Date Value Ref Range Status  ?01/23/2021 >60 >60 mL/min Final  ?  Comment:  ?  (NOTE) ?Calculated using the CKD-EPI Creatinine Equation (2021) ?  ? ?GFR  ?Date Value Ref Range Status  ?01/06/2020 59.40 (L) >60.00 mL/min Final  ? ?eGFR  ?Date Value Ref Range Status  ?09/20/2021 67 > OR = 60 mL/min/1.26m Final  ?  Comment:  ?  The eGFR is based on the CKD-EPI 2021 equation. To calculate  ?the new eGFR from a previous Creatinine or Cystatin C ?result, go to https://www.kidney.org/professionals/ ?kdoqi/gfr%5Fcalculator ?  ?  ?  ?  ?  Passed - B12 Level in normal range and within 720 days  ?  Vitamin B-12  ?Date Value Ref Range Status  ?12/21/2019 325 180 - 914 pg/mL Final  ?  Comment:  ?  (NOTE) ?This assay is not validated for testing neonatal or ?myeloproliferative syndrome specimens for Vitamin B12 levels. ?Performed at [facility] [address] [address], [id number]?  ?  ?  ?  ?  Passed - Valid encounter within last 6 months  ?  Recent Outpatient Visits   ? ?      ? 2 months ago Encounter for general adult medical examination with abnormal findings  [facility] [place]  ? 5 months ago [facility]  [facility]  ? 10 months ago Sacral pain  ? [facility] [place]  ? ?  ?  ? ? ?  ?  ?  ? mirtazapine (REMERON) 45 MG tablet [Pharmacy Med Name: MIRTAZAPINE 45 MG Tablet] 90 tablet 3  ?  Sig: TAKE 1 TABLET AT BEDTIME  ?  ? Psychiatry: Antidepressants -  mirtazapine Passed - 11/24/2021  2:50 AM  ?  ?  Passed - Completed PHQ-2 or PHQ-9 in the last 360 days  ?  ?  Passed - Valid encounter within last 6 months  ?  Recent Outpatient Visits   ? ?      ? 2 months

## 2021-11-28 ENCOUNTER — Other Ambulatory Visit: Payer: Self-pay | Admitting: Internal Medicine

## 2021-11-28 NOTE — Telephone Encounter (Signed)
Requested medication (s) are due for refill today: yes ? ?Requested medication (s) are on the active medication list: yes ? ?Last refill:  10/23/21 #45/0 ? ?Future visit scheduled: no ? ?Notes to clinic:  Unable to refill per protocol, cannot delegate. ? ? ? ?  ?Requested Prescriptions  ?Pending Prescriptions Disp Refills  ? LORazepam (ATIVAN) 0.5 MG tablet [Pharmacy Med Name: LORAZEPAM 0.5 MG TABLET] 45 tablet 0  ?  Sig: TAKE 1 TO 2 TABLETS BY MOUTH TWICE A DAY AS NEEDED  ?  ? Not Delegated - Psychiatry: Anxiolytics/Hypnotics 2 Failed - 11/28/2021 11:42 AM  ?  ?  Failed - This refill cannot be delegated  ?  ?  Failed - Urine Drug Screen completed in last 360 days  ?  ?  Passed - Patient is not pregnant  ?  ?  Passed - Valid encounter within last 6 months  ?  Recent Outpatient Visits   ? ?      ? 2 months ago Encounter for general adult medical examination with abnormal findings  ? [facility] [place], [place] W, NP  ? 5 months ago Aortic atherosclerosis [facility])  ? [facility] [place], [place] W, NP  ? 10 months ago Sacral pain  ? [facility] [place], Coralie Keens, NP  ? ?  ?  ? ? ?  ?  ?  ? ?

## 2021-12-03 ENCOUNTER — Ambulatory Visit (INDEPENDENT_AMBULATORY_CARE_PROVIDER_SITE_OTHER): Payer: Medicare HMO

## 2021-12-03 DIAGNOSIS — Z0000 Encounter for general adult medical examination without abnormal findings: Secondary | ICD-10-CM

## 2021-12-03 NOTE — Progress Notes (Signed)
? ?Subjective:  ? Alexander Booth is a 76 y.o. male who presents for Medicare Annual/Subsequent preventive examination. ?I connected with Ammie Ferrier today by telephone and verified that I am speaking with the correct person using two identifiers. ?Location patient: home ?Location provider: work ?Persons participating in the virtual visit: Yidel Teuscher, daughter Glyn Ade, CMA,   ?  ?I discussed the limitations, risks, security and privacy concerns of performing an evaluation and management service by telephone and the availability of in person appointments. I also discussed with the patient that there may be a patient responsible charge related to this service. The patient expressed understanding and verbally consented to this telephonic visit.  ?  ?Interactive audio and video telecommunications were attempted between this provider and patient, however failed, due to patient having technical difficulties OR patient did not have access to video capability.  We continued and completed visit with audio only. ?Review of Systems    ?Per HPI unless specifically indicated below ?   ?Cardiac Risk Factors include: sedentary lifestyle;hypertension and diabetes ? ?   ?Objective:  ?  ?Today's Vitals  ? 12/03/21 0948 12/03/21 0949  ?PainSc: 8  8   ? ?There is no height or weight on file to calculate BMI. ? ? ?  04/03/2021  ?  3:00 PM 01/26/2021  ?  8:00 AM 01/25/2021  ? 12:13 PM 01/23/2021  ?  9:22 AM 10/19/2020  ?  1:50 PM 10/19/2020  ?  9:40 AM 01/31/2020  ?  9:01 AM  ?Advanced Directives  ?Does Patient Have a Medical Advance Directive? Yes Yes Yes Yes Yes Yes Yes  ?Type of Advance Directive Living will [place];Living will  [place];Living will Living will Living will [place];Living will  ?Does patient want to make changes to medical advance directive?  No - Patient declined No - Patient declined  No - Patient declined    ? ? ?Current Medications  (verified) ?Outpatient Encounter Medications as of 12/03/2021  ?Medication Sig  ? Accu-Chek Softclix Lancets lancets Check sugar 2 times daily. DX E11.8  ? acetaminophen (TYLENOL) 500 MG tablet Take 1,000 mg by mouth every 6 (six) hours as needed for moderate pain.  ? Albuterol Sulfate (PROAIR RESPICLICK) 130 (90 Base) MCG/ACT AEPB Inhale 1-2 puffs into the lungs every 4 (four) hours as needed. (Patient taking differently: Inhale 1-2 puffs into the lungs every 4 (four) hours as needed (shortness of breath).)  ? aspirin EC 81 MG tablet Take 81 mg by mouth daily with breakfast.  ? Blood Glucose Monitoring Suppl (ACCU-CHEK AVIVA PLUS) w/Device KIT Check sugar 2 times daily. DX E11.8  ? BREO ELLIPTA 100-25 MCG/INH AEPB INHALE 1 PUFF BY MOUTH EVERY DAY  ? carbidopa-levodopa (SINEMET IR) 25-100 MG tablet TAKE 1 TABLET THREE TIMES DAILY (AT 7AM, 11AM, 4PM)  ? Cholecalciferol (QC VITAMIN D3) 50 MCG (2000 UT) TABS Take 2,000 Units by mouth daily with lunch.  ? citalopram (CELEXA) 20 MG tablet TAKE 1 TABLET EVERY DAY  ? cyclobenzaprine (FLEXERIL) 5 MG tablet Take 5 mg by mouth 2 (two) times daily.  ? divalproex (DEPAKOTE ER) 500 MG 24 hr tablet TAKE 2 TABLETS EVERY DAY  ? donepezil (ARICEPT) 10 MG tablet Take 1 tablet (10 mg total) by mouth daily.  ? famotidine (PEPCID) 10 MG tablet Take 2 tablets (20 mg total) by mouth daily.  ? gabapentin (NEURONTIN) 400 MG capsule TAKE 3 CAPSULES AT BEDTIME AND MAY REPEAT DOSE ONE  TIME IF NEEDED (Patient taking differently: Take 800 mg by mouth at bedtime.)  ? glucose blood (ACCU-CHEK AVIVA PLUS) test strip Check sugar 2 times daily. DX E11.8  ? glucose blood (ACCU-CHEK GUIDE) test strip Use as instructed  ? lisinopril (ZESTRIL) 5 MG tablet Take 1 tablet (5 mg total) by mouth daily.  ? LORazepam (ATIVAN) 0.5 MG tablet TAKE 1 TO 2 TABLETS BY MOUTH TWICE A DAY AS NEEDED  ? meloxicam (MOBIC) 15 MG tablet TAKE 1 TABLET EVERY DAY WITH SUPPER  ? memantine (NAMENDA XR) 7 MG CP24 24 hr capsule TAKE  1 CAPSULE (7 MG TOTAL) BY MOUTH EVERY MORNING.  ? metFORMIN (GLUCOPHAGE) 500 MG tablet Take 1 tablet (500 mg total) by mouth 2 (two) times daily with a meal.  ? mirtazapine (REMERON) 45 MG tablet Take 1 tablet (45 mg total) by mouth at bedtime.  ? nitroGLYCERIN (NITROSTAT) 0.4 MG SL tablet Place 0.4 mg under the tongue every 5 (five) minutes x 3 doses as needed for chest pain.  ? oxyCODONE-acetaminophen (PERCOCET) 5-325 MG tablet Take 1-2 tablets by mouth every 6 (six) hours as needed for severe pain.  ? simvastatin (ZOCOR) 40 MG tablet TAKE 1 TABLET EVERY DAY AT 6:00PM  ? tamsulosin (FLOMAX) 0.4 MG CAPS capsule TAKE 1 CAPSULE BY MOUTH EVERY DAY  ? traZODone (DESYREL) 50 MG tablet TAKE 1 TABLET BY MOUTH EVERYDAY AT BEDTIME  ? vitamin C (ASCORBIC ACID) 500 MG tablet Take 500 mg by mouth daily with lunch.  ? [DISCONTINUED] gentamicin cream (GARAMYCIN) 0.1 % Apply 1 application topically 2 (two) times daily.  ? SYMPROIC 0.2 MG TABS  (Patient not taking: Reported on 12/03/2021)  ? [DISCONTINUED] cyclobenzaprine (FLEXERIL) 5 MG tablet Take 1 tablet by mouth 2 (two) times daily. (Patient not taking: Reported on 12/03/2021)  ? [DISCONTINUED] furosemide (LASIX) 20 MG tablet TAKE 1 TABLET TWICE DAILY WITH BREAKFAST AND LUNCH (NEED TO SCHEDULE 6 MONTH FOLLOW UP) (Patient not taking: Reported on 12/03/2021)  ? [DISCONTINUED] sildenafil (VIAGRA) 50 MG tablet Take 1 tablet (50 mg total) by mouth as needed.  ? ?No facility-administered encounter medications on file as of 12/03/2021.  ? ? ?Allergies (verified) ?Codeine  ? ?History: ?Past Medical History:  ?Diagnosis Date  ? Arthritis   ? COPD (chronic obstructive pulmonary disease) ([place])   ? Depression   ? Dyspnea   ? Erectile dysfunction   ? He's had a hx of   ? Hypercholesterolemia   ? Hypertension   ? Essential  ? Lumbosacral disc disease   ? with chronic low back pain, has implanted nerve stimulator  ? Peripheral neuropathy   ? Presenile dementia   ? Type 2 diabetes mellitus ([place])  2010  ? takes oral meds  ? ?Past Surgical History:  ?Procedure Laterality Date  ? ANTERIOR CERVICAL DECOMP/DISCECTOMY FUSION    ? APPENDECTOMY    ? HEMORRHOID SURGERY    ? x2  ? JOINT REPLACEMENT Left   ? 2nd one was to change siZe  ? KNEE SURGERY    ? Previous left total replacement  ? KYPHOPLASTY N/A 12/10/2012  ? Procedure: Lumbar one Kyphoplasty;  Surgeon: Kristeen Miss, MD;  Location: [place] [facility];  Service: Neurosurgery;  Laterality: N/A;  Lumbar One Kyphoplasty  ? LUMBAR LAMINECTOMY/DECOMPRESSION MICRODISCECTOMY Bilateral 01/25/2021  ? Procedure: Bilateral Lumbar 4-5, Lumbar 5 Sacral 1 Laminectomy/Foraminotomy;  Surgeon: Erline Levine, MD;  Location: [place];  Service: Neurosurgery;  Laterality: Bilateral;  3C/RM 21  ? OTHER SURGICAL HISTORY  1992  ? Bone Fusion in Right Foot  ? SPINAL CORD STIMULATOR INSERTION N/A 12/02/2014  ? Procedure: THORACIC SPINAL CORD STIMULATOR INSERTION INFINION [employer]OSTON [employer] 16 LEAD WITH LAMINECTOMY;  Surgeon: Erline Levine, MD;  Location: [place] [facility];  Service: Neurosurgery;  Laterality: N/A;  THORACIC SPINAL CORD STIMULATOR INSERTION INFINION BOSTON [employer] 16 LEAD WITH LAMINECTOMY  ? SPINAL CORD STIMULATOR REMOVAL N/A 10/19/2020  ? Procedure: Removal of spinal cord stimulator with paddle lead;  Surgeon: Erline Levine, MD;  Location: [place];  Service: Neurosurgery;  Laterality: N/A;  ? TOTAL KNEE ARTHROPLASTY Left   ? ?Family History  ?Problem Relation Age of Onset  ? Multiple myeloma Mother   ? Diabetes Mother   ? Diabetes Sister   ? Lymphoma Sister   ? Stroke Neg Hx   ? ?Social History  ? ?Socioeconomic History  ? Marital status: Married  ?  Spouse name: Vaughan Basta  ? Number of children: 3  ? Years of education: 66  ? Highest education level: Not on file  ?Occupational History  ? Occupation: retired  ?  Comment: retired  ?Tobacco Use  ? Smoking status: Every Day  ?  Packs/day: 1.00  ?  Years: 40.00  ?  Pack years: 40.00  ?  Types: Cigarettes  ? Smokeless tobacco: Never  ?Vaping Use   ? Vaping Use: Never used  ?Substance and Sexual Activity  ? Alcohol use: No  ?  Alcohol/week: 0.0 standard drinks  ? Drug use: No  ? Sexual activity: Not on file  ?Other Topics Concern  ? Not on file  ?S

## 2021-12-03 NOTE — Patient Instructions (Signed)
Fall Prevention in the Home, Adult ?Falls can cause injuries and can happen to people of all ages. There are many things you can do to make your home safe and to help prevent falls. Ask for help when making these changes. ?What actions can I take to prevent falls? ?General Instructions ?Use good lighting in all rooms. Replace any light bulbs that burn out. ?Turn on the lights in dark areas. Use night-lights. ?Keep items that you use often in easy-to-reach places. Lower the shelves around your home if needed. ?Set up your furniture so you have a clear path. Avoid moving your furniture around. ?Do not have throw rugs or other things on the floor that can make you trip. ?Avoid walking on wet floors. ?If any of your floors are uneven, fix them. ?Add color or contrast paint or tape to clearly mark and help you see: ?Grab bars or handrails. ?First and last steps of staircases. ?Where the edge of each step is. ?If you use a stepladder: ?Make sure that it is fully opened. Do not climb a closed stepladder. ?Make sure the sides of the stepladder are locked in place. ?Ask someone to hold the stepladder while you use it. ?Know where your pets are when moving through your home. ?What can I do in the bathroom? ? ?  ? ?Keep the floor dry. Clean up any water on the floor right away. ?Remove soap buildup in the tub or shower. ?Use nonskid mats or decals on the floor of the tub or shower. ?Attach bath mats securely with double-sided, nonslip rug tape. ?If you need to sit down in the shower, use a plastic, nonslip stool. ?Install grab bars by the toilet and in the tub and shower. Do not use towel bars as grab bars. ?What can I do in the bedroom? ?Make sure that you have a light by your bed that is easy to reach. ?Do not use any sheets or blankets for your bed that hang to the floor. ?Have a firm chair with side arms that you can use for support when you get dressed. ?What can I do in the kitchen? ?Clean up any spills right away. ?If  you need to reach something above you, use a step stool with a grab bar. ?Keep electrical cords out of the way. ?Do not use floor polish or wax that makes floors slippery. ?What can I do with my stairs? ?Do not leave any items on the stairs. ?Make sure that you have a light switch at the top and the bottom of the stairs. ?Make sure that there are handrails on both sides of the stairs. Fix handrails that are broken or loose. ?Install nonslip stair treads on all your stairs. ?Avoid having throw rugs at the top or bottom of the stairs. ?Choose a carpet that does not hide the edge of the steps on the stairs. ?Check carpeting to make sure that it is firmly attached to the stairs. Fix carpet that is loose or worn. ?What can I do on the outside of my home? ?Use bright outdoor lighting. ?Fix the edges of walkways and driveways and fix any cracks. ?Remove anything that might make you trip as you walk through a door, such as a raised step or threshold. ?Trim any bushes or trees on paths to your home. ?Check to see if handrails are loose or broken and that both sides of all steps have handrails. ?Install guardrails along the edges of any raised decks and porches. ?Clear paths of anything  that can make you trip, such as tools or rocks. ?Have leaves, snow, or ice cleared regularly. ?Use sand or salt on paths during winter. ?Clean up any spills in your garage right away. This includes grease or oil spills. ?What other actions can I take? ?Wear shoes that: ?Have a low heel. Do not wear high heels. ?Have rubber bottoms. ?Feel good on your feet and fit well. ?Are closed at the toe. Do not wear open-toe sandals. ?Use tools that help you move around if needed. These include: ?Canes. ?Walkers. ?Scooters. ?Crutches. ?Review your medicines with your doctor. Some medicines can make you feel dizzy. This can increase your chance of falling. ?Ask your doctor what else you can do to help prevent falls. ?Where to find more information ?Centers  for Disease Control and Prevention, [employer]AD[employer]: http://www.wolf.info/ ?[employer] on Aging: http://kim-miller.com/ ?Contact a doctor if: ?You are afraid of falling at home. ?You feel weak, drowsy, or dizzy at home. ?You fall at home. ?Summary ?There are many simple things that you can do to make your home safe and to help prevent falls. ?Ways to make your home safe include removing things that can make you trip and installing grab bars in the bathroom. ?Ask for help when making these changes in your home. ?This information is not intended to replace advice given to you by your health care provider. Make sure you discuss any questions you have with your health care provider. ?Document Revised: 04/09/2021 Document Reviewed: 02/09/2020 ?Elsevier Patient Education ? [place]. ? ?Health Maintenance, Male ?Adopting a healthy lifestyle and getting preventive care are important in promoting health and wellness. Ask your health care provider about: ?The right schedule for you to have regular tests and exams. ?Things you can do on your own to prevent diseases and keep yourself healthy. ?What should I know about diet, weight, and exercise? ?Eat a healthy diet ? ?Eat a diet that includes plenty of vegetables, fruits, low-fat dairy products, and lean protein. ?Do not eat a lot of foods that are high in solid fats, added sugars, or sodium. ?Maintain a healthy weight ?Body mass index (BMI) is a measurement that can be used to identify possible weight problems. It estimates body fat based on height and weight. Your health care provider can help determine your BMI and help you achieve or maintain a healthy weight. ?Get regular exercise ?Get regular exercise. This is one of the most important things you can do for your health. Most adults should: ?Exercise for at least 150 minutes each week. The exercise should increase your heart rate and make you sweat (moderate-intensity exercise). ?Do strengthening exercises at least twice a week.  This is in addition to the moderate-intensity exercise. ?Spend less time sitting. Even light physical activity can be beneficial. ?Watch cholesterol and blood lipids ?Have your blood tested for lipids and cholesterol at 78 years of age, then have this test every 5 years. ?You may need to have your cholesterol levels checked more often if: ?Your lipid or cholesterol levels are high. ?You are older than 78 years of age. ?You are at high risk for heart disease. ?What should I know about cancer screening? ?Many types of cancers can be detected early and may often be prevented. Depending on your health history and family history, you may need to have cancer screening at various ages. This may include screening for: ?Colorectal cancer. ?Prostate cancer. ?Skin cancer. ?Lung cancer. ?What should I know about heart disease, diabetes, and high blood pressure? ?Blood  pressure and heart disease ?High blood pressure causes heart disease and increases the risk of stroke. This is more likely to develop in people who have high blood pressure readings or are overweight. ?Talk with your health care provider about your target blood pressure readings. ?Have your blood pressure checked: ?Every 3-5 years if you are 18-26 years of age. ?Every year if you are 4 years old or older. ?If you are between the ages of 40 and 55 and are a current or former smoker, ask your health care provider if you should have a one-time screening for abdominal aortic aneurysm (AAA). ?Diabetes ?Have regular diabetes screenings. This checks your fasting blood sugar level. Have the screening done: ?Once every three years after age 69 if you are at a normal weight and have a low risk for diabetes. ?More often and at a younger age if you are overweight or have a high risk for diabetes. ?What should I know about preventing infection? ?Hepatitis B ?If you have a higher risk for hepatitis B, you should be screened for this virus. Talk with your health care provider  to find out if you are at risk for hepatitis B infection. ?Hepatitis C ?Blood testing is recommended for: ?Everyone born from [birth year] through 1967. ?Anyone with known risk factors for hepatitis C. ?Sexually

## 2021-12-05 ENCOUNTER — Ambulatory Visit: Payer: Medicare HMO | Admitting: Podiatry

## 2021-12-05 DIAGNOSIS — E0843 Diabetes mellitus due to underlying condition with diabetic autonomic (poly)neuropathy: Secondary | ICD-10-CM

## 2021-12-05 DIAGNOSIS — L97522 Non-pressure chronic ulcer of other part of left foot with fat layer exposed: Secondary | ICD-10-CM

## 2021-12-05 NOTE — Progress Notes (Signed)
? ?Subjective:  ?76 y.o. male with PMHx of diabetes mellitus presenting today for follow-up evaluation of an ulcer to the right great toe.  Patient was fitted for custom molded orthotics and insoles and they are pending dispensable.  He has been applying the antibiotic cream and a light dressing daily and wearing surgical shoe.  He says that the pain has improved significantly. ? ? ?Past Medical History:  ?Diagnosis Date  ? Arthritis   ? COPD (chronic obstructive pulmonary disease) ([place])   ? Depression   ? Dyspnea   ? Erectile dysfunction   ? He's had a hx of   ? Hypercholesterolemia   ? Hypertension   ? Essential  ? Lumbosacral disc disease   ? with chronic low back pain, has implanted nerve stimulator  ? Peripheral neuropathy   ? Presenile dementia   ? Type 2 diabetes mellitus ([place]) 2010  ? takes oral meds  ? ?Past Surgical History:  ?Procedure Laterality Date  ? ANTERIOR CERVICAL DECOMP/DISCECTOMY FUSION    ? APPENDECTOMY    ? HEMORRHOID SURGERY    ? x2  ? JOINT REPLACEMENT Left   ? 2nd one was to change siZe  ? KNEE SURGERY    ? Previous left total replacement  ? KYPHOPLASTY N/A 12/10/2012  ? Procedure: Lumbar one Kyphoplasty;  Surgeon: Kristeen Miss, MD;  Location: [facility];  Service: Neurosurgery;  Laterality: N/A;  Lumbar One Kyphoplasty  ? LUMBAR LAMINECTOMY/DECOMPRESSION MICRODISCECTOMY Bilateral 01/25/2021  ? Procedure: Bilateral Lumbar 4-5, Lumbar 5 Sacral 1 Laminectomy/Foraminotomy;  Surgeon: Erline Levine, MD;  Location: [facility];  Service: Neurosurgery;  Laterality: Bilateral;  3C/RM 21  ? OTHER SURGICAL HISTORY  1992  ? Bone Fusion in Right Foot  ? SPINAL CORD STIMULATOR INSERTION N/A 12/02/2014  ? Procedure: THORACIC SPINAL CORD STIMULATOR INSERTION INFINION [employer] 16 LEAD WITH LAMINECTOMY;  Surgeon: Erline Levine, MD;  Location: [facility];  Service: Neurosurgery;  Laterality: N/A;  THORACIC SPINAL CORD STIMULATOR INSERTION INFINION [employer]OSTON [employer] 16 LEAD WITH LAMINECTOMY  ? SPINAL CORD  STIMULATOR REMOVAL N/A 10/19/2020  ? Procedure: Removal of spinal cord stimulator with paddle lead;  Surgeon: Erline Levine, MD;  Location: [facility];  Service: Neurosurgery;  Laterality: N/A;  ? TOTAL KNEE ARTHROPLASTY Left   ? ?Allergies  ?Allergen Reactions  ? Codeine Hives and Itching  ?Objective/Physical Exam ?General: The patient is alert and oriented x3 in no acute distress. ? ?Dermatology:  ?Wound #1 noted to the plantar aspect of the left great toe appears to demonstrate good improvement since last visit.  It measures approximately 0.5x0.5 x 0.2 cm.  Please see above noted photo ?To the noted ulceration(s), there is no eschar. There is a moderate amount of slough, fibrin, and necrotic tissue noted. Granulation tissue and wound base is red. There is a minimal amount of serosanguineous drainage noted. There is no exposed bone muscle-tendon ligament or joint. There is no malodor. Periwound integrity is intact. ?Skin is warm, dry and supple bilateral lower extremities. ? ?Vascular: VAS [place] ABI WITH/WO TBI 09/14/2021 ?Summary:  ?Right: Resting right ankle-brachial index is within normal range. No  ?evidence of significant right lower extremity arterial disease. The right  ?toe-brachial index is normal.  ? ?Left: Resting left ankle-brachial index is within normal range. No  ?evidence of significant left lower extremity arterial disease. The left  ?toe-brachial index is normal.  ? ?Neurological: Epicritic and protective threshold diminished bilaterally.  ? ?Musculoskeletal Exam: Range of motion within normal limits to  all pedal and ankle joints bilateral. Muscle strength 5/5 in all groups bilateral.  ? ?Radiographic exam LT foot 09/04/2021: Diffuse degenerative changes noted throughout the foot.  No fractures identified.  Normal osseous mineralization.  Absence of the fifth metatarsal head noted from prior surgery. ? ?Assessment: ?1.  Ulcer left great toe secondary to diabetes mellitus ?2. diabetes mellitus w/ peripheral  neuropathy ? ?Plan of Care:  ?1. Patient was evaluated.   ?2. medically necessary excisional debridement including subcutaneous tissue was performed using a tissue nipper and a chisel blade. Excisional debridement of all the necrotic nonviable tissue down to healthy bleeding viable tissue was performed with post-debridement measurements same as pre-. ?3. the wound was cleansed and dry sterile dressing applied. ?4.  Continue gentamicin cream daily with a light dressing ?5.  Continue postsurgical shoe with offloading felt dancers pad to the insole to offload pressure from the great toe ?6.  Custom diabetic shoes and insoles pending.  The patient was already molded ?7.  Return to clinic 4 weeks ? ? ?Edrick Kins, DPM ?[facility] ? ?Dr. Edrick Kins, DPM  ?  ?[address]                                      ?[place], [place] [address]                ?Office [phone]  ?Fax [phone] ? ? ? ? ?

## 2021-12-12 DIAGNOSIS — R296 Repeated falls: Secondary | ICD-10-CM | POA: Diagnosis not present

## 2021-12-12 DIAGNOSIS — G20 Parkinson's disease: Secondary | ICD-10-CM | POA: Diagnosis not present

## 2021-12-12 DIAGNOSIS — M5442 Lumbago with sciatica, left side: Secondary | ICD-10-CM | POA: Diagnosis not present

## 2021-12-12 DIAGNOSIS — F1120 Opioid dependence, uncomplicated: Secondary | ICD-10-CM | POA: Diagnosis not present

## 2021-12-14 ENCOUNTER — Other Ambulatory Visit: Payer: Self-pay | Admitting: Internal Medicine

## 2021-12-18 NOTE — Telephone Encounter (Signed)
Called daughter [name].  Furosemide is no longer on his medication list.  Has he stopped this?

## 2021-12-18 NOTE — Telephone Encounter (Signed)
Alexander Booth states he is no longer taking furosemide.    Thanks,   -Alexander Booth

## 2021-12-18 NOTE — Telephone Encounter (Signed)
Requested medication (s) are due for refill today - no  Requested medication (s) are on the active medication list -no  Future visit scheduled -no  Last refill: discontinued at last OV- unsure  Notes to clinic: No longer on current medication list- sent for PCP review.  Requested Prescriptions  Pending Prescriptions Disp Refills   furosemide (LASIX) 20 MG tablet [Pharmacy Med Name: FUROSEMIDE 20 MG Tablet] 180 tablet     Sig: TAKE 1 TABLET TWICE DAILY WITH BREAKFAST AND LUNCH (NEED TO SCHEDULE 6 MONTH FOLLOW UP).     Cardiovascular:  Diuretics - Loop Failed - 12/14/2021  5:10 AM      Failed - K in normal range and within 180 days    Potassium  Date Value Ref Range Status  09/20/2021 5.6 (H) 3.5 - 5.3 mmol/L Final         Failed - Mg Level in normal range and within 180 days    Magnesium  Date Value Ref Range Status  12/25/2019 2.0 1.7 - 2.4 mg/dL Final    Comment:    Performed at [facility], [place] [address] [place], [place] [address]         Failed - Last BP in normal range    BP Readings from Last 1 Encounters:  09/20/21 (!) 148/72         Passed - Ca in normal range and within 180 days    Calcium  Date Value Ref Range Status  09/20/2021 9.9 8.6 - 10.3 mg/dL Final         Passed - Na in normal range and within 180 days    Sodium  Date Value Ref Range Status  09/20/2021 140 135 - 146 mmol/L Final  10/11/2014 141 134 - 144 mmol/L Final         Passed - Cr in normal range and within 180 days    Creat  Date Value Ref Range Status  09/20/2021 1.13 0.70 - 1.28 mg/dL Final   Creatinine,U  Date Value Ref Range Status  03/20/2017 115.3 mg/dL Final   Creatinine, Urine  Date Value Ref Range Status  09/20/2021 209 20 - 320 mg/dL Final         Passed - Cl in normal range and within 180 days    Chloride  Date Value Ref Range Status  09/20/2021 100 98 - 110 mmol/L Final         Passed - Valid encounter within last 6 months    Recent Outpatient Visits            2 months ago Encounter for general adult medical examination with abnormal findings   [facility] [place], Coralie Keens, NP   5 months ago Aortic atherosclerosis [facility])   [facility], NP   11 months ago Sacral pain   [facility] [place], Coralie Keens, NP                  Requested Prescriptions  Pending Prescriptions Disp Refills   furosemide (LASIX) 20 MG tablet [Pharmacy Med Name: FUROSEMIDE 20 MG Tablet] 180 tablet     Sig: TAKE 1 TABLET TWICE DAILY WITH BREAKFAST AND LUNCH (NEED TO SCHEDULE 6 MONTH FOLLOW UP).     Cardiovascular:  Diuretics - Loop Failed - 12/14/2021  5:10 AM      Failed - K in normal range and within 180 days    Potassium  Date Value Ref  Range Status  09/20/2021 5.6 (H) 3.5 - 5.3 mmol/L Final         Failed - Mg Level in normal range and within 180 days    Magnesium  Date Value Ref Range Status  12/25/2019 2.0 1.7 - 2.4 mg/dL Final    Comment:    Performed at [facility], [place] [address], [place], [place] [address]         Failed - Last BP in normal range    BP Readings from Last 1 Encounters:  09/20/21 (!) 148/72         Passed - Ca in normal range and within 180 days    Calcium  Date Value Ref Range Status  09/20/2021 9.9 8.6 - 10.3 mg/dL Final         Passed - Na in normal range and within 180 days    Sodium  Date Value Ref Range Status  09/20/2021 140 135 - 146 mmol/L Final  10/11/2014 141 134 - 144 mmol/L Final         Passed - Cr in normal range and within 180 days    Creat  Date Value Ref Range Status  09/20/2021 1.13 0.70 - 1.28 mg/dL Final   Creatinine,U  Date Value Ref Range Status  03/20/2017 115.3 mg/dL Final   Creatinine, Urine  Date Value Ref Range Status  09/20/2021 209 20 - 320 mg/dL Final         Passed - Cl in normal range and within 180 days    Chloride  Date Value Ref Range Status  09/20/2021 100 98 - 110 mmol/L Final         Passed  - Valid encounter within last 6 months    Recent Outpatient Visits           2 months ago Encounter for general adult medical examination with abnormal findings   [place], NP   5 months ago Aortic atherosclerosis [facility])   [facility] [name], NP   11 months ago Sacral pain   [facility] [place], Coralie Keens, [place]

## 2021-12-31 ENCOUNTER — Other Ambulatory Visit: Payer: Self-pay | Admitting: Internal Medicine

## 2022-01-01 ENCOUNTER — Other Ambulatory Visit: Payer: Self-pay | Admitting: Internal Medicine

## 2022-01-01 MED ORDER — TRAZODONE HCL 50 MG PO TABS: 50.0000 mg | ORAL_TABLET | Freq: Every day | ORAL | 0 refills | Status: DC

## 2022-01-01 NOTE — Telephone Encounter (Signed)
Refilled 01/01/2022 #[id number] refills Requested Prescriptions  Pending Prescriptions Disp Refills  . traZODone (DESYREL) 50 MG tablet [Pharmacy Med Name: TRAZODONE 50 MG TABLET] 90 tablet 0    Sig: TAKE 1 TABLET BY MOUTH EVERYDAY AT BEDTIME     Psychiatry: Antidepressants - Serotonin Modulator Passed - 12/31/2021  7:31 PM      Passed - Completed PHQ-2 or PHQ-9 in the last 360 days      Passed - Valid encounter within last 6 months    Recent Outpatient Visits          3 months ago Encounter for general adult medical examination with abnormal findings   [place], NP   6 months ago Aortic atherosclerosis [facility])   [facility], NP   11 months ago Sacral pain   [facility], Coralie Keens, [place]

## 2022-01-07 ENCOUNTER — Other Ambulatory Visit: Payer: Self-pay | Admitting: Internal Medicine

## 2022-01-07 ENCOUNTER — Ambulatory Visit: Payer: Medicare HMO | Admitting: Podiatry

## 2022-01-07 DIAGNOSIS — E0843 Diabetes mellitus due to underlying condition with diabetic autonomic (poly)neuropathy: Secondary | ICD-10-CM | POA: Diagnosis not present

## 2022-01-07 DIAGNOSIS — L97522 Non-pressure chronic ulcer of other part of left foot with fat layer exposed: Secondary | ICD-10-CM | POA: Diagnosis not present

## 2022-01-07 MED ORDER — GENTAMICIN SULFATE 0.1 % EX CREA
1.0000 | TOPICAL_CREAM | Freq: Two times a day (BID) | CUTANEOUS | 1 refills | Status: DC
Start: 2022-01-07 — End: 2022-05-15

## 2022-01-07 NOTE — Telephone Encounter (Signed)
Requested Prescriptions  Pending Prescriptions Disp Refills  . tamsulosin (FLOMAX) 0.4 MG CAPS capsule [Pharmacy Med Name: TAMSULOSIN HCL 0.4 MG CAPSULE] 90 capsule 2    Sig: TAKE 1 CAPSULE BY MOUTH EVERY DAY     Urology: Alpha-Adrenergic Blocker Failed - 01/07/2022  2:07 AM      Failed - Last BP in normal range    BP Readings from Last 1 Encounters:  09/20/21 (!) 148/72         Passed - PSA in normal range and within 360 days    PSA  Date Value Ref Range Status  09/20/2021 0.66 < OR = 4.00 ng/mL Final    Comment:    The total PSA value from this assay system is  standardized against the WHO standard. The test  result will be approximately 20% lower when compared  to the equimolar-standardized total PSA (Beckman  Coulter). Comparison of serial PSA results should be  interpreted with this fact in mind. . This test was performed using the Siemens  chemiluminescent method. Values obtained from  different assay methods cannot be used interchangeably. PSA levels, regardless of value, should not be interpreted as absolute evidence of the presence or absence of disease.   06/01/2019 0.56 0.10 - 4.00 ng/ml Final    Comment:    Test performed using Access Hybritech PSA Assay, a parmagnetic partical, chemiluminecent immunoassay.         Passed - Valid encounter within last 12 months    Recent Outpatient Visits          3 months ago Encounter for general adult medical examination with abnormal findings   [facility] [place], Coralie Keens, NP   6 months ago Aortic atherosclerosis [facility])   [facility], NP   12 months ago Sacral pain   [facility] [place], Coralie Keens, [place]

## 2022-01-07 NOTE — Progress Notes (Signed)
Subjective:  76 y.o. male with PMHx of diabetes mellitus presenting today for follow-up evaluation of an ulcer to the right great toe.  Patient states that they have been applying the antibiotic cream and a light dressing as instructed.  He has been wearing the postsurgical shoe.  No new complaints at this time   Past Medical History:  Diagnosis Date   Arthritis    COPD (chronic obstructive pulmonary disease) (HCC)    Depression    Dyspnea    Erectile dysfunction    He's had a hx of    Hypercholesterolemia    Hypertension    Essential   Lumbosacral disc disease    with chronic low back pain, has implanted nerve stimulator   Peripheral neuropathy    Presenile dementia    Type 2 diabetes mellitus ([place]) 2010   takes oral meds   Past Surgical History:  Procedure Laterality Date   ANTERIOR CERVICAL DECOMP/DISCECTOMY FUSION     APPENDECTOMY     HEMORRHOID SURGERY     x2   JOINT REPLACEMENT Left    2nd one was to change siZe   KNEE SURGERY     Previous left total replacement   KYPHOPLASTY N/A 12/10/2012   Procedure: Lumbar one Kyphoplasty;  Surgeon: Kristeen Miss, MD;  [facility];  Service: Neurosurgery;  Laterality: N/A;  Lumbar One Kyphoplasty   LUMBAR LAMINECTOMY/DECOMPRESSION MICRODISCECTOMY Bilateral 01/25/2021   Procedure: Bilateral Lumbar 4-5, Lumbar 5 Sacral 1 Laminectomy/Foraminotomy;  Surgeon: Erline Levine, MD;  Location: [place];  Service: Neurosurgery;  Laterality: Bilateral;  3C/RM 21   OTHER SURGICAL HISTORY  1992   Bone Fusion in Right Foot   SPINAL CORD STIMULATOR INSERTION N/A 12/02/2014   Procedure: THORACIC SPINAL CORD STIMULATOR INSERTION INFINION BOSTON SCIENTIFIC 16 LEAD WITH LAMINECTOMY;  Surgeon: Erline Levine, MD;  Location: [facility];  Service: Neurosurgery;  Laterality: N/A;  THORACIC SPINAL CORD STIMULATOR INSERTION INFINION BOSTON SCIENTIFIC 16 LEAD WITH LAMINECTOMY   SPINAL CORD STIMULATOR REMOVAL N/A 10/19/2020   Procedure: Removal of spinal  cord stimulator with paddle lead;  Surgeon: Erline Levine, MD;  Location: [facility];  Service: Neurosurgery;  Laterality: N/A;   TOTAL KNEE ARTHROPLASTY Left    Allergies  Allergen Reactions   Codeine Hives and Itching      Objective/Physical Exam General: The patient is alert and oriented x3 in no acute distress.  Dermatology:  Wound noted to the plantar aspect of the left great toe measuring approximately 1.0 x 1.0 x 0.2 cm.  Please see above noted photo To the noted ulceration(s), there is no eschar. There is a moderate amount of slough, fibrin, and necrotic tissue noted. Granulation tissue and wound base is red. There is a minimal amount of serosanguineous drainage noted. There is no exposed bone muscle-tendon ligament or joint. There is no malodor. Periwound integrity is intact. Skin is warm, dry and supple bilateral lower extremities.  Vascular: VAS [place] ABI WITH/WO TBI 09/14/2021 Summary:  Right: Resting right ankle-brachial index is within normal range. No  evidence of significant right lower extremity arterial disease. The right  toe-brachial index is normal.   Left: Resting left ankle-brachial index is within normal range. No  evidence of significant left lower extremity arterial disease. The left  toe-brachial index is normal.   Neurological: Epicritic and protective threshold diminished bilaterally.   Musculoskeletal Exam: Pes planovalgus deformity noted  Radiographic exam LT foot 09/04/2021: Diffuse degenerative changes noted throughout the foot.  No fractures identified.  Normal osseous mineralization.  Absence of the fifth metatarsal head noted from prior surgery.  Assessment: 1.  Ulcer left great toe secondary to diabetes mellitus 2. diabetes mellitus w/ peripheral neuropathy  Plan of Care:  1. Patient was evaluated.   2. medically necessary excisional debridement including subcutaneous tissue was performed using a tissue nipper and a chisel blade. Excisional  debridement of all the necrotic nonviable tissue down to healthy bleeding viable tissue was performed with post-debridement measurements same as pre-. 3. the wound was cleansed and dry sterile dressing applied. 4.  Continue gentamicin cream daily with a light dressing.  Refill sent to the pharmacy 5.  Continue postsurgical shoe with offloading felt dancers pad to the insole to offload pressure from the great toe 6.  Custom diabetic shoes and insoles pending.  The patient was already molded about 3 months ago 7.  Return to clinic 4 weeks   Edrick Kins, DPM [facility]  Dr. Edrick Kins, DPM    [address], [place] [address]                Office [phone]  Fax [phone]

## 2022-01-08 ENCOUNTER — Other Ambulatory Visit: Payer: Self-pay | Admitting: Neurology

## 2022-01-08 DIAGNOSIS — G20 Parkinson's disease: Secondary | ICD-10-CM

## 2022-01-11 ENCOUNTER — Other Ambulatory Visit: Payer: Self-pay | Admitting: Internal Medicine

## 2022-01-11 NOTE — Telephone Encounter (Signed)
Requested medication (s) are due for refill today - yes  Requested medication (s) are on the active medication list -yes  Future visit scheduled -no  Last refill: 11/29/21 #45  Notes to clinic: non delegated Rx  Requested Prescriptions  Pending Prescriptions Disp Refills   LORazepam (ATIVAN) 0.5 MG tablet [Pharmacy Med Name: LORAZEPAM 0.5 MG TABLET] 45 tablet 0    Sig: TAKE 1 TO 2 TABLETS BY MOUTH TWICE A DAY AS NEEDED     Not Delegated - Psychiatry: Anxiolytics/Hypnotics 2 Failed - 01/11/2022  4:08 PM      Failed - This refill cannot be delegated      Failed - Urine Drug Screen completed in last 360 days      Passed - Patient is not pregnant      Passed - Valid encounter within last 6 months    Recent Outpatient Visits           3 months ago Encounter for general adult medical examination with abnormal findings   [facility] [place], Salvadore [place], NP   6 months ago Aortic atherosclerosis [facility])   [facility], Salvadore [place], NP   12 months ago Sacral pain   [facility] [place], Salvadore [place], NP                 Requested Prescriptions  Pending Prescriptions Disp Refills   LORazepam (ATIVAN) 0.5 MG tablet [Pharmacy Med Name: LORAZEPAM 0.5 MG TABLET] 45 tablet 0    Sig: TAKE 1 TO 2 TABLETS BY MOUTH TWICE A DAY AS NEEDED     Not Delegated - Psychiatry: Anxiolytics/Hypnotics 2 Failed - 01/11/2022  4:08 PM      Failed - This refill cannot be delegated      Failed - Urine Drug Screen completed in last 360 days      Passed - Patient is not pregnant      Passed - Valid encounter within last 6 months    Recent Outpatient Visits           3 months ago Encounter for general adult medical examination with abnormal findings   [facility] [place], Salvadore Oxford, NP   6 months ago Aortic atherosclerosis [facility])   [facility] [place], NP   12 months ago Sacral pain   [facility] [place],  Salvadore Oxford, [place]

## 2022-01-14 DIAGNOSIS — R296 Repeated falls: Secondary | ICD-10-CM | POA: Diagnosis not present

## 2022-01-25 ENCOUNTER — Other Ambulatory Visit: Payer: Self-pay | Admitting: Internal Medicine

## 2022-01-25 NOTE — Telephone Encounter (Signed)
Requested Prescriptions  Pending Prescriptions Disp Refills  . gabapentin (NEURONTIN) 400 MG capsule [Pharmacy Med Name: GABAPENTIN 400 MG Capsule] 270 capsule 3    Sig: TAKE 3 CAPSULES AT BEDTIME AND MAY REPEAT DOSE ONE TIME IF NEEDED     Neurology: Anticonvulsants - gabapentin Passed - 01/25/2022  4:20 PM      Passed - Cr in normal range and within 360 days    Creat  Date Value Ref Range Status  09/20/2021 1.13 0.70 - 1.28 mg/dL Final   Creatinine,U  Date Value Ref Range Status  03/20/2017 115.3 mg/dL Final   Creatinine, Urine  Date Value Ref Range Status  09/20/2021 209 20 - 320 mg/dL Final         Passed - Completed PHQ-2 or PHQ-9 in the last 360 days      Passed - Valid encounter within last 12 months    Recent Outpatient Visits          4 months ago Encounter for general adult medical examination with abnormal findings   [place], NP   7 months ago Aortic atherosclerosis [facility])   [facility] [place], Coralie Keens, NP   1 year ago Sacral pain   [facility] [place], Coralie Keens, NP

## 2022-02-04 ENCOUNTER — Ambulatory Visit: Payer: Medicare HMO | Admitting: Podiatry

## 2022-02-06 ENCOUNTER — Other Ambulatory Visit: Payer: Self-pay | Admitting: Neurosurgery

## 2022-02-06 ENCOUNTER — Other Ambulatory Visit: Payer: Self-pay | Admitting: Internal Medicine

## 2022-02-06 ENCOUNTER — Ambulatory Visit: Payer: Medicare HMO | Admitting: Podiatry

## 2022-02-06 DIAGNOSIS — F32A Depression, unspecified: Secondary | ICD-10-CM

## 2022-02-06 DIAGNOSIS — G20 Parkinson's disease: Secondary | ICD-10-CM | POA: Diagnosis not present

## 2022-02-06 DIAGNOSIS — G4700 Insomnia, unspecified: Secondary | ICD-10-CM

## 2022-02-06 DIAGNOSIS — R296 Repeated falls: Secondary | ICD-10-CM | POA: Diagnosis not present

## 2022-02-06 DIAGNOSIS — M5416 Radiculopathy, lumbar region: Secondary | ICD-10-CM | POA: Diagnosis not present

## 2022-02-06 DIAGNOSIS — G8929 Other chronic pain: Secondary | ICD-10-CM

## 2022-02-06 DIAGNOSIS — F1120 Opioid dependence, uncomplicated: Secondary | ICD-10-CM | POA: Diagnosis not present

## 2022-02-06 DIAGNOSIS — F03918 Unspecified dementia, unspecified severity, with other behavioral disturbance: Secondary | ICD-10-CM

## 2022-02-07 NOTE — Telephone Encounter (Signed)
Requested medication (s) are due for refill today: Yes  Requested medication (s) are on the active medication list: Yes  Last refill:  09/18/21  Future visit scheduled: No  Notes to clinic:  Protocol indicates lab work is needed.    Requested Prescriptions  Pending Prescriptions Disp Refills   divalproex (DEPAKOTE ER) 500 MG 24 hr tablet [Pharmacy Med Name: DIVALPROEX SODIUM ER 500 MG Tablet Extended Release 24 Hour] 180 tablet 0    Sig: TAKE 2 TABLETS EVERY DAY     Neurology:  Anticonvulsants - Valproates Failed - 02/06/2022  3:36 AM      Failed - ALT in normal range and within 360 days    ALT  Date Value Ref Range Status  09/20/2021 3 (L) 9 - 46 U/L Final         Failed - Valproic Acid (serum) in normal range and within 360 days    Valproic Acid Lvl  Date Value Ref Range Status  12/17/2016 53 50.0 - 100.0 ug/mL Final         Passed - AST in normal range and within 360 days    AST  Date Value Ref Range Status  09/20/2021 11 10 - 35 U/L Final         Passed - HGB in normal range and within 360 days    Hemoglobin  Date Value Ref Range Status  09/20/2021 14.1 13.2 - 17.1 g/dL Final         Passed - PLT in normal range and within 360 days    Platelets  Date Value Ref Range Status  09/20/2021 172 140 - 400 Thousand/uL Final         Passed - WBC in normal range and within 360 days    WBC  Date Value Ref Range Status  09/20/2021 6.1 3.8 - 10.8 Thousand/uL Final         Passed - HCT in normal range and within 360 days    HCT  Date Value Ref Range Status  09/20/2021 42.5 38.5 - 50.0 % Final         Passed - Completed PHQ-2 or PHQ-9 in the last 360 days      Passed - Patient is not pregnant      Passed - Valid encounter within last 12 months    Recent Outpatient Visits           4 months ago Encounter for general adult medical examination with abnormal findings   [facility] [place], Coralie Keens, NP   7 months ago Aortic atherosclerosis [facility])    [facility] [place], Coralie Keens, NP   1 year ago Sacral pain   [facility] [place], Coralie Keens, NP              Signed Prescriptions Disp Refills   donepezil (ARICEPT) 10 MG tablet 90 tablet 0    Sig: TAKE 1 TABLET EVERY DAY     Neurology:  Alzheimer's Agents Passed - 02/06/2022  3:36 AM      Passed - Valid encounter within last 6 months    Recent Outpatient Visits           4 months ago Encounter for general adult medical examination with abnormal findings   [place], NP   7 months ago Aortic atherosclerosis [facility])   [facility] [place], Coralie Keens, NP   1 year ago Sacral pain  [facility] [place], [place] W, NP               simvastatin (ZOCOR) 40 MG tablet 90 tablet 0    Sig: TAKE 1 TABLET EVERY DAY AT 6:00PM     Cardiovascular:  Antilipid - Statins Failed - 02/06/2022  3:36 AM      Failed - Lipid Panel in normal range within the last 12 months    Cholesterol  Date Value Ref Range Status  09/20/2021 176 <200 mg/dL Final   LDL Cholesterol (Calc)  Date Value Ref Range Status  09/20/2021 94 mg/dL (calc) Final    Comment:    Reference range: <100 . Desirable range <100 mg/dL for primary prevention;   <70 mg/dL for patients with CHD or diabetic patients  with > or = 2 CHD risk factors. Marland Kitchen LDL-C is now calculated using the Martin-Hopkins  calculation, which is a validated novel method providing  better accuracy than the Friedewald equation in the  estimation of LDL-C.  Cresenciano Genre et al. Annamaria Helling. 2633;354(56): 2061-2068  (http://education.QuestDiagnostics.com/faq/FAQ164)    Direct LDL  Date Value Ref Range Status  04/27/2019 92.0 mg/dL Final    Comment:    Optimal:  <100 mg/dLNear or Above Optimal:  100-129 mg/dLBorderline High:  130-159 mg/dLHigh:  160-189 mg/dLVery High:  >190 mg/dL   HDL  Date Value Ref Range Status  09/20/2021 43 > OR = 40 mg/dL Final   Triglycerides   Date Value Ref Range Status  09/20/2021 275 (H) <150 mg/dL Final    Comment:    . If a non-fasting specimen was collected, consider repeat triglyceride testing on a fasting specimen if clinically indicated.  Yates Decamp et al. J. of Clin. Lipidol. 2563;8:937-342. Marland Kitchen          Passed - Patient is not pregnant      Passed - Valid encounter within last 12 months    Recent Outpatient Visits           4 months ago Encounter for general adult medical examination with abnormal findings   [facility] [place], Coralie Keens, NP   7 months ago Aortic atherosclerosis [facility])   [facility] [place], Coralie Keens, NP   1 year ago Sacral pain   [facility] [place], Coralie Keens, [place]

## 2022-02-12 DIAGNOSIS — M5126 Other intervertebral disc displacement, lumbar region: Secondary | ICD-10-CM | POA: Diagnosis not present

## 2022-02-14 ENCOUNTER — Other Ambulatory Visit: Payer: Self-pay | Admitting: Internal Medicine

## 2022-02-15 ENCOUNTER — Other Ambulatory Visit: Payer: Self-pay | Admitting: Neurosurgery

## 2022-02-15 DIAGNOSIS — M546 Pain in thoracic spine: Secondary | ICD-10-CM

## 2022-02-15 NOTE — Telephone Encounter (Signed)
Requested medication (s) are due for refill today:yes  Requested medication (s) are on the active medication list:yes  Last refill:  01/04/22 and 01/14/22  Future visit scheduled: yes  Notes to clinic:  Unable to refill per protocol, cannot delegate Lorazepam. Rx request for Trazodone is too soon. Last refill was 01/01/22 for 90 days.     Requested Prescriptions  Pending Prescriptions Disp Refills   LORazepam (ATIVAN) 0.5 MG tablet [Pharmacy Med Name: LORAZEPAM 0.5 MG TABLET] 45 tablet 0    Sig: TAKE 1 TO 2 TABLETS BY MOUTH TWICE A DAY AS NEEDED     Not Delegated - Psychiatry: Anxiolytics/Hypnotics 2 Failed - 02/14/2022  9:41 AM      Failed - This refill cannot be delegated      Failed - Urine Drug Screen completed in last 360 days      Passed - Patient is not pregnant      Passed - Valid encounter within last 6 months    Recent Outpatient Visits           4 months ago Encounter for general adult medical examination with abnormal findings   [facility] [place], Coralie Keens, NP   7 months ago Aortic atherosclerosis [facility])   [facility] [place], Coralie Keens, NP   1 year ago Sacral pain   [facility] [place], [place] W, NP               traZODone (DESYREL) 50 MG tablet [Pharmacy Med Name: TRAZODONE 50 MG TABLET] 90 tablet 0    Sig: TAKE 1 TABLET BY MOUTH EVERYDAY AT BEDTIME     Psychiatry: Antidepressants - Serotonin Modulator Passed - 02/14/2022  9:41 AM      Passed - Completed PHQ-2 or PHQ-9 in the last 360 days      Passed - Valid encounter within last 6 months    Recent Outpatient Visits           4 months ago Encounter for general adult medical examination with abnormal findings   [facility] [place], Coralie Keens, NP   7 months ago Aortic atherosclerosis [facility])   [facility] [place], Coralie Keens, NP   1 year ago Sacral pain   [facility] [place], Coralie Keens, [place]

## 2022-02-20 ENCOUNTER — Other Ambulatory Visit: Payer: Medicare HMO

## 2022-02-20 ENCOUNTER — Ambulatory Visit
Admission: RE | Admit: 2022-02-20 | Discharge: 2022-02-20 | Disposition: A | Discharge status: Home / Self Care | Payer: Medicare HMO | Source: Ambulatory Visit | Attending: Neurosurgery | Admitting: Neurosurgery

## 2022-02-20 DIAGNOSIS — R200 Anesthesia of skin: Secondary | ICD-10-CM | POA: Diagnosis not present

## 2022-02-20 DIAGNOSIS — M47814 Spondylosis without myelopathy or radiculopathy, thoracic region: Secondary | ICD-10-CM | POA: Diagnosis not present

## 2022-02-20 DIAGNOSIS — R531 Weakness: Secondary | ICD-10-CM | POA: Diagnosis not present

## 2022-02-20 DIAGNOSIS — M4804 Spinal stenosis, thoracic region: Secondary | ICD-10-CM | POA: Diagnosis not present

## 2022-02-20 DIAGNOSIS — M546 Pain in thoracic spine: Secondary | ICD-10-CM

## 2022-02-20 MED ORDER — GADOBENATE DIMEGLUMINE 529 MG/ML IV SOLN
17.0000 mL | Freq: Once | INTRAVENOUS | Status: AC | PRN
  Administered 2022-02-20: 17 mL via INTRAVENOUS

## 2022-02-21 ENCOUNTER — Other Ambulatory Visit: Payer: Self-pay | Admitting: Internal Medicine

## 2022-02-21 ENCOUNTER — Telehealth: Payer: Self-pay | Admitting: Internal Medicine

## 2022-02-21 DIAGNOSIS — M546 Pain in thoracic spine: Secondary | ICD-10-CM | POA: Diagnosis not present

## 2022-02-21 DIAGNOSIS — F03918 Unspecified dementia, unspecified severity, with other behavioral disturbance: Secondary | ICD-10-CM

## 2022-02-21 DIAGNOSIS — F1120 Opioid dependence, uncomplicated: Secondary | ICD-10-CM | POA: Diagnosis not present

## 2022-02-21 DIAGNOSIS — G4700 Insomnia, unspecified: Secondary | ICD-10-CM

## 2022-02-21 DIAGNOSIS — M5136 Other intervertebral disc degeneration, lumbar region: Secondary | ICD-10-CM | POA: Diagnosis not present

## 2022-02-21 NOTE — Telephone Encounter (Signed)
Gerald Stabs calling from [place] is calling to result O2 order for current litter flow. Fax- [phone]- [place] [employer] Phone- [phone]

## 2022-02-22 NOTE — Telephone Encounter (Signed)
I do not manage his oxygen

## 2022-02-22 NOTE — Telephone Encounter (Signed)
Requested Prescriptions  Pending Prescriptions Disp Refills  . memantine (NAMENDA XR) 7 MG CP24 24 hr capsule [Pharmacy Med Name: MEMANTINE HCL ER 7 MG CAPSULE] 90 capsule 0    Sig: TAKE 1 CAPSULE (7 MG TOTAL) BY MOUTH EVERY MORNING.     Neurology:  Alzheimer's Agents 2 Passed - 02/21/2022  3:59 PM      Passed - Cr in normal range and within 360 days    Creat  Date Value Ref Range Status  09/20/2021 1.13 0.70 - 1.28 mg/dL Final   Creatinine,U  Date Value Ref Range Status  03/20/2017 115.3 mg/dL Final   Creatinine, Urine  Date Value Ref Range Status  09/20/2021 209 20 - 320 mg/dL Final         Passed - eGFR is 5 or above and within 360 days    GFR calc Af Amer  Date Value Ref Range Status  12/25/2019 >60 >60 mL/min Final   GFR, Estimated  Date Value Ref Range Status  01/23/2021 >60 >60 mL/min Final    Comment:    (NOTE) Calculated using the CKD-EPI Creatinine Equation (2021)    GFR  Date Value Ref Range Status  01/06/2020 59.40 (L) >60.00 mL/min Final   eGFR  Date Value Ref Range Status  09/20/2021 67 > OR = 60 mL/min/1.37m Final    Comment:    The eGFR is based on the CKD-EPI 2021 equation. To calculate  the new eGFR from a previous Creatinine or Cystatin C result, go to https://www.kidney.org/professionals/ kdoqi/gfr%5Fcalculator          Passed - Valid encounter within last 6 months    Recent Outpatient Visits          5 months ago Encounter for general adult medical examination with abnormal findings   [place]   8 months ago Aortic atherosclerosis ([facility]   [facility]   1 year ago Sacral pain   [facility] [place]

## 2022-02-27 ENCOUNTER — Other Ambulatory Visit: Payer: Self-pay | Admitting: Internal Medicine

## 2022-02-28 NOTE — Telephone Encounter (Signed)
Requested Prescriptions  Pending Prescriptions Disp Refills  . lisinopril (ZESTRIL) 5 MG tablet [Pharmacy Med Name: LISINOPRIL 5 MG TABLET] 30 tablet 0    Sig: TAKE 1 TABLET (5 MG TOTAL) BY MOUTH DAILY.     Cardiovascular:  ACE Inhibitors Failed - 02/27/2022  7:28 PM      Failed - K in normal range and within 180 days    Potassium  Date Value Ref Range Status  09/20/2021 5.6 (H) 3.5 - 5.3 mmol/L Final         Failed - Last BP in normal range    BP Readings from Last 1 Encounters:  09/20/21 (!) 148/72         Passed - Cr in normal range and within 180 days    Creat  Date Value Ref Range Status  09/20/2021 1.13 0.70 - 1.28 mg/dL Final   Creatinine,U  Date Value Ref Range Status  03/20/2017 115.3 mg/dL Final   Creatinine, Urine  Date Value Ref Range Status  09/20/2021 209 20 - 320 mg/dL Final         Passed - Patient is not pregnant      Passed - Valid encounter within last 6 months    Recent Outpatient Visits          5 months ago Encounter for general adult medical examination with abnormal findings   [place], Coralie Keens, NP   8 months ago Aortic atherosclerosis [facility])   [facility] [place], Coralie Keens, NP   1 year ago Sacral pain   [facility] [place], Coralie Keens, [place]

## 2022-03-04 ENCOUNTER — Ambulatory Visit: Payer: Medicare HMO | Admitting: Podiatry

## 2022-03-04 DIAGNOSIS — L97522 Non-pressure chronic ulcer of other part of left foot with fat layer exposed: Secondary | ICD-10-CM

## 2022-03-04 DIAGNOSIS — E0843 Diabetes mellitus due to underlying condition with diabetic autonomic (poly)neuropathy: Secondary | ICD-10-CM

## 2022-03-04 NOTE — Addendum Note (Signed)
Addended by: Edrick Kins on: 03/04/2022 09:37 AM   Modules accepted: Orders

## 2022-03-04 NOTE — Progress Notes (Addendum)
Subjective:  76 y.o. male with PMHx of diabetes mellitus presenting today for follow-up evaluation of an ulcer to the right great toe.  Patient states that they have been applying the antibiotic cream and a light dressing as instructed.  He has been wearing the postsurgical shoe.  No new complaints at this time   Past Medical History:  Diagnosis Date   Arthritis    COPD (chronic obstructive pulmonary disease) (HCC)    Depression    Dyspnea    Erectile dysfunction    He's had a hx of    Hypercholesterolemia    Hypertension    Essential   Lumbosacral disc disease    with chronic low back pain, has implanted nerve stimulator   Peripheral neuropathy    Presenile dementia    Type 2 diabetes mellitus ([place]) 2010   takes oral meds   Past Surgical History:  Procedure Laterality Date   ANTERIOR CERVICAL DECOMP/DISCECTOMY FUSION     APPENDECTOMY     HEMORRHOID SURGERY     x2   JOINT REPLACEMENT Left    2nd one was to change siZe   KNEE SURGERY     Previous left total replacement   KYPHOPLASTY N/A 12/10/2012   Procedure: Lumbar one Kyphoplasty;  Surgeon: Kristeen Miss, MD;  Location: [facility];  Service: Neurosurgery;  Laterality: N/A;  Lumbar One Kyphoplasty   LUMBAR LAMINECTOMY/DECOMPRESSION MICRODISCECTOMY Bilateral 01/25/2021   Procedure: Bilateral Lumbar 4-5, Lumbar 5 Sacral 1 Laminectomy/Foraminotomy;  Surgeon: Erline Levine, MD;  Location: [place];  Service: Neurosurgery;  Laterality: Bilateral;  3C/RM 21   OTHER SURGICAL HISTORY  1992   Bone Fusion in Right Foot   SPINAL CORD STIMULATOR INSERTION N/A 12/02/2014   Procedure: THORACIC SPINAL CORD STIMULATOR INSERTION INFINION BOSTON SCIENTIFIC 16 LEAD WITH LAMINECTOMY;  Surgeon: Erline Levine, MD;  Location: [facility];  Service: Neurosurgery;  Laterality: N/A;  THORACIC SPINAL CORD STIMULATOR INSERTION INFINION BOSTON SCIENTIFIC 16 LEAD WITH LAMINECTOMY   SPINAL CORD STIMULATOR REMOVAL N/A 10/19/2020   Procedure: Removal of spinal  cord stimulator with paddle lead;  Surgeon: Erline Levine, MD;  Location: [place];  Service: Neurosurgery;  Laterality: N/A;   TOTAL KNEE ARTHROPLASTY Left    Allergies  Allergen Reactions   Codeine Hives and Itching      Objective/Physical Exam General: The patient is alert and oriented x3 in no acute distress.  Dermatology:  Wound noted to the plantar aspect of the left great toe measuring approximately 0.8x0.5 x 0.2 cm.  Please see above noted photo To the noted ulceration(s), there is no eschar. There is a moderate amount of slough, fibrin, and necrotic tissue noted. Granulation tissue and wound base is red. There is a minimal amount of serosanguineous drainage noted. There is no exposed bone muscle-tendon ligament or joint. There is no malodor. Periwound integrity is intact. Skin is warm, dry and supple bilateral lower extremities.  Vascular: VAS [place] ABI WITH/WO TBI 09/14/2021 Summary:  Right: Resting right ankle-brachial index is within normal range. No  evidence of significant right lower extremity arterial disease. The right  toe-brachial index is normal.   Left: Resting left ankle-brachial index is within normal range. No  evidence of significant left lower extremity arterial disease. The left  toe-brachial index is normal.   Neurological: Epicritic and protective threshold diminished bilaterally.   Musculoskeletal Exam: Pes planovalgus deformity noted  Radiographic exam LT foot 09/04/2021: Diffuse degenerative changes noted throughout the foot.  No fractures identified.  Normal  osseous mineralization.  Absence of the fifth metatarsal head noted from prior surgery.  Assessment: 1.  Ulcer left great toe secondary to diabetes mellitus 2. diabetes mellitus w/ peripheral neuropathy  Plan of Care:  1. Patient was evaluated.   2. medically necessary excisional debridement including subcutaneous tissue was performed using a tissue nipper and a chisel blade. Excisional debridement  of all the necrotic nonviable tissue down to healthy bleeding viable tissue was performed with post-debridement measurements same as pre-. 3. the wound was cleansed and dry sterile dressing applied. 4.  Continue gentamicin cream daily with a light dressing.  Refill sent to the pharmacy 5.  Continue postsurgical shoe with offloading felt dancers pad to the insole to offload pressure from the great toe 6.  Custom diabetic shoes and insoles pending.  The patient was already molded about 4 months ago.  New order was placed today for diabetic shoes 7.  Return to clinic 3 weeks   Edrick Kins, DPM [facility]  Dr. Edrick Kins, DPM    [address] [place], [place] [address]                Office [phone]  Fax [phone]

## 2022-03-06 ENCOUNTER — Other Ambulatory Visit: Payer: Self-pay | Admitting: Internal Medicine

## 2022-03-06 DIAGNOSIS — F03918 Unspecified dementia, unspecified severity, with other behavioral disturbance: Secondary | ICD-10-CM

## 2022-03-06 DIAGNOSIS — G4700 Insomnia, unspecified: Secondary | ICD-10-CM

## 2022-03-06 NOTE — Telephone Encounter (Signed)
Requested medication (s) are due for refill today: no  Requested medication (s) are on the active medication list: yes  Last refill:  02/22/22  Future visit scheduled:yes  Notes to clinic:  Unable to refill per protocol, last refill by provider 02/22/22 for 90 days. Possible duplicate request.     Requested Prescriptions  Pending Prescriptions Disp Refills   memantine (NAMENDA XR) 7 MG CP24 24 hr capsule [Pharmacy Med Name: MEMANTINE HCL ER 7 MG CAPSULE] 90 capsule 0    Sig: TAKE 1 CAPSULE (7 MG TOTAL) BY MOUTH EVERY MORNING.     Neurology:  Alzheimer's Agents 2 Passed - 03/06/2022  7:47 AM      Passed - Cr in normal range and within 360 days    Creat  Date Value Ref Range Status  09/20/2021 1.13 0.70 - 1.28 mg/dL Final   Creatinine,U  Date Value Ref Range Status  03/20/2017 115.3 mg/dL Final   Creatinine, Urine  Date Value Ref Range Status  09/20/2021 209 20 - 320 mg/dL Final         Passed - eGFR is 5 or above and within 360 days    GFR calc Af Amer  Date Value Ref Range Status  12/25/2019 >60 >60 mL/min Final   GFR, Estimated  Date Value Ref Range Status  01/23/2021 >60 >60 mL/min Final    Comment:    (NOTE) Calculated using the CKD-EPI Creatinine Equation (2021)    GFR  Date Value Ref Range Status  01/06/2020 59.40 (L) >60.00 mL/min Final   eGFR  Date Value Ref Range Status  09/20/2021 67 > OR = 60 mL/min/1.71m Final    Comment:    The eGFR is based on the CKD-EPI 2021 equation. To calculate  the new eGFR from a previous Creatinine or Cystatin C result, go to https://www.kidney.org/professionals/ kdoqi/gfr%5Fcalculator          Passed - Valid encounter within last 6 months    Recent Outpatient Visits           5 months ago Encounter for general adult medical examination with abnormal findings   [facility]   8 months ago [facility]ortic atherosclerosis [facility]   [facility]   1 year ago  Sacral pain   [facility]              Signed Prescriptions Disp Refills   traZODone (DESYREL) 50 MG tablet 90 tablet 0    Sig: TAKE 1 TABLET BY MOUTH EVERYDAY AT BEDTIME     Psychiatry: Antidepressants - Serotonin Modulator Passed - 03/06/2022  7:47 AM      Passed - Completed PHQ-2 or PHQ-9 in the last 360 days      Passed - Valid encounter within last 6 months    Recent Outpatient Visits           5 months ago Encounter for general adult medical examination with abnormal findings   [facility]   8 months ago Aortic atherosclerosis ([facility]   [facility]   1 year ago Sacral pain   [facility]

## 2022-03-06 NOTE — Telephone Encounter (Signed)
Requested Prescriptions  Pending Prescriptions Disp Refills  . memantine (NAMENDA XR) 7 MG CP24 24 hr capsule [Pharmacy Med Name: MEMANTINE HCL ER 7 MG CAPSULE] 90 capsule 0    Sig: TAKE 1 CAPSULE (7 MG TOTAL) BY MOUTH EVERY MORNING.     Neurology:  Alzheimer's Agents 2 Passed - 03/06/2022  7:47 AM      Passed - Cr in normal range and within 360 days    Creat  Date Value Ref Range Status  09/20/2021 1.13 0.70 - 1.28 mg/dL Final   Creatinine,U  Date Value Ref Range Status  03/20/2017 115.3 mg/dL Final   Creatinine, Urine  Date Value Ref Range Status  09/20/2021 209 20 - 320 mg/dL Final         Passed - eGFR is 5 or above and within 360 days    GFR calc Af Amer  Date Value Ref Range Status  12/25/2019 >60 >60 mL/min Final   GFR, Estimated  Date Value Ref Range Status  01/23/2021 >60 >60 mL/min Final    Comment:    (NOTE) Calculated using the CKD-EPI Creatinine Equation (2021)    GFR  Date Value Ref Range Status  01/06/2020 59.40 (L) >60.00 mL/min Final   eGFR  Date Value Ref Range Status  09/20/2021 67 > OR = 60 mL/min/1.53m Final    Comment:    The eGFR is based on the CKD-EPI 2021 equation. To calculate  the new eGFR from a previous Creatinine or Cystatin C result, go to https://www.kidney.org/professionals/ kdoqi/gfr%5Fcalculator          Passed - Valid encounter within last 6 months    Recent Outpatient Visits          5 months ago Encounter for general adult medical examination with abnormal findings   [facility]   8 months ago Aortic atherosclerosis ([facility]   [facility]   1 year ago Sacral pain   [facility] [place] [place]             . traZODone (DCostilla 50 MG tablet [Pharmacy Med Name: TRAZODONE 50 MG TABLET] 90 tablet 0    Sig: TAKE 1 TABLET BY MOUTH EVERYDAY AT BEDTIME     Psychiatry: Antidepressants - Serotonin Modulator Passed - 03/06/2022  7:47 AM       Passed - Completed PHQ-2 or PHQ-9 in the last 360 days      Passed - Valid encounter within last 6 months    Recent Outpatient Visits          5 months ago Encounter for general adult medical examination with abnormal findings   [place]   8 months ago [facility]ortic atherosclerosis ([facility]   [facility]   1 year ago Sacral pain   [facility]

## 2022-03-11 ENCOUNTER — Encounter: Payer: Self-pay | Admitting: Internal Medicine

## 2022-03-11 DIAGNOSIS — L821 Other seborrheic keratosis: Secondary | ICD-10-CM | POA: Diagnosis not present

## 2022-03-11 DIAGNOSIS — C44629 Squamous cell carcinoma of skin of left upper limb, including shoulder: Secondary | ICD-10-CM | POA: Diagnosis not present

## 2022-03-11 DIAGNOSIS — D225 Melanocytic nevi of trunk: Secondary | ICD-10-CM | POA: Diagnosis not present

## 2022-03-11 DIAGNOSIS — Z08 Encounter for follow-up examination after completed treatment for malignant neoplasm: Secondary | ICD-10-CM | POA: Diagnosis not present

## 2022-03-11 DIAGNOSIS — R208 Other disturbances of skin sensation: Secondary | ICD-10-CM | POA: Diagnosis not present

## 2022-03-11 DIAGNOSIS — D492 Neoplasm of unspecified behavior of bone, soft tissue, and skin: Secondary | ICD-10-CM | POA: Diagnosis not present

## 2022-03-11 DIAGNOSIS — L570 Actinic keratosis: Secondary | ICD-10-CM | POA: Diagnosis not present

## 2022-03-11 DIAGNOSIS — Z85828 Personal history of other malignant neoplasm of skin: Secondary | ICD-10-CM | POA: Diagnosis not present

## 2022-03-13 ENCOUNTER — Other Ambulatory Visit: Payer: Self-pay | Admitting: Internal Medicine

## 2022-03-13 DIAGNOSIS — J449 Chronic obstructive pulmonary disease, unspecified: Secondary | ICD-10-CM

## 2022-03-19 ENCOUNTER — Other Ambulatory Visit: Payer: Self-pay | Admitting: Internal Medicine

## 2022-03-19 DIAGNOSIS — G4700 Insomnia, unspecified: Secondary | ICD-10-CM

## 2022-03-19 DIAGNOSIS — F03918 Unspecified dementia, unspecified severity, with other behavioral disturbance: Secondary | ICD-10-CM

## 2022-03-20 NOTE — Telephone Encounter (Signed)
reordered 02/22/22 #90 capsules- should have enough until 05/25/22   Requested Prescriptions  Refused Prescriptions Disp Refills  . memantine (NAMENDA XR) 7 MG CP24 24 hr capsule [Pharmacy Med Name: MEMANTINE HCL ER 7 MG CAPSULE] 90 capsule 0    Sig: TAKE 1 CAPSULE (7 MG TOTAL) BY MOUTH EVERY MORNING.     Neurology:  Alzheimer's Agents 2 Passed - 03/19/2022  6:45 PM      Passed - Cr in normal range and within 360 days    Creat  Date Value Ref Range Status  09/20/2021 1.13 0.70 - 1.28 mg/dL Final   Creatinine,U  Date Value Ref Range Status  03/20/2017 115.3 mg/dL Final   Creatinine, Urine  Date Value Ref Range Status  09/20/2021 209 20 - 320 mg/dL Final         Passed - eGFR is 5 or above and within 360 days    GFR calc Af Amer  Date Value Ref Range Status  12/25/2019 >60 >60 mL/min Final   GFR, Estimated  Date Value Ref Range Status  01/23/2021 >60 >60 mL/min Final    Comment:    (NOTE) Calculated using the CKD-EPI Creatinine Equation (2021)    GFR  Date Value Ref Range Status  01/06/2020 59.40 (L) >60.00 mL/min Final   eGFR  Date Value Ref Range Status  09/20/2021 67 > OR = 60 mL/min/1.73m2 Final    Comment:    The eGFR is based on the CKD-EPI 2021 equation. To calculate  the new eGFR from a previous Creatinine or Cystatin C result, go to https://www.kidney.org/professionals/ kdoqi/gfr%5Fcalculator          Passed - Valid encounter within last 6 months    Recent Outpatient Visits          6 months ago Encounter for general adult medical examination with abnormal findings   [facility] Baity, Regina W, NP   9 months ago Aortic atherosclerosis (HCC)   [facility] Baity, Regina W, NP   1 year ago Sacral pain   [facility] Baity, Regina W, NP               

## 2022-03-26 ENCOUNTER — Ambulatory Visit: Payer: Medicare HMO | Admitting: Podiatry

## 2022-03-26 DIAGNOSIS — L97522 Non-pressure chronic ulcer of other part of left foot with fat layer exposed: Secondary | ICD-10-CM

## 2022-03-26 DIAGNOSIS — E0843 Diabetes mellitus due to underlying condition with diabetic autonomic (poly)neuropathy: Secondary | ICD-10-CM

## 2022-03-26 MED ORDER — DOXYCYCLINE HYCLATE 100 MG PO TABS: 100.0000 mg | ORAL_TABLET | Freq: Two times a day (BID) | ORAL | 0 refills | Status: DC

## 2022-03-26 NOTE — Addendum Note (Signed)
Addended by: Edrick Kins on: 03/26/2022 08:57 AM   Modules accepted: Orders

## 2022-03-26 NOTE — Progress Notes (Addendum)
No chief complaint on file.   Subjective:  76 y.o. male with PMHx of diabetes mellitus presenting today for follow-up evaluation of an ulcer to the right great toe.  Patient states that they have been applying the antibiotic cream and a light dressing as instructed.  He has been wearing the postsurgical shoe.  Patient states that recently he has noticed an increase of pain and is worried about possible infection.    Past Medical History:  Diagnosis Date   Arthritis    COPD (chronic obstructive pulmonary disease) (HCC)    Depression    Dyspnea    Erectile dysfunction    He's had a hx of    Hypercholesterolemia    Hypertension    Essential   Lumbosacral disc disease    with chronic low back pain, has implanted nerve stimulator   Peripheral neuropathy    Presenile dementia    Type 2 diabetes mellitus ([place]) 2010   takes oral meds   Past Surgical History:  Procedure Laterality Date   ANTERIOR CERVICAL DECOMP/DISCECTOMY FUSION     APPENDECTOMY     HEMORRHOID SURGERY     x2   JOINT REPLACEMENT Left    2nd one was to change siZe   KNEE SURGERY     Previous left total replacement   KYPHOPLASTY N/A 12/10/2012   Procedure: Lumbar one Kyphoplasty;  Surgeon: Kristeen Miss, MD;  [facility]: [facility];  Service: Neurosurgery;  Laterality: N/A;  Lumbar One Kyphoplasty   LUMBAR LAMINECTOMY/DECOMPRESSION MICRODISCECTOMY Bilateral 01/25/2021   Procedure: Bilateral Lumbar 4-5, Lumbar 5 Sacral 1 Laminectomy/Foraminotomy;  Surgeon: Erline Levine, MD;  Location: [place];  Service: Neurosurgery;  Laterality: Bilateral;  3C/RM 21   OTHER SURGICAL HISTORY  1992   Bone Fusion in Right Foot   SPINAL CORD STIMULATOR INSERTION N/A 12/02/2014   Procedure: THORACIC SPINAL CORD STIMULATOR INSERTION INFINION BOSTON SCIENTIFIC 16 LEAD WITH LAMINECTOMY;  Surgeon: Erline Levine, MD;  Location: [place] [facility];  Service: Neurosurgery;  Laterality: N/A;  THORACIC SPINAL CORD STIMULATOR INSERTION INFINION BOSTON  SCIENTIFIC 16 LEAD WITH LAMINECTOMY   SPINAL CORD STIMULATOR REMOVAL N/A 10/19/2020   Procedure: Removal of spinal cord stimulator with paddle lead;  Surgeon: Erline Levine, MD;  Location: [place];  Service: Neurosurgery;  Laterality: N/A;   TOTAL KNEE ARTHROPLASTY Left    Allergies  Allergen Reactions   Codeine Hives and Itching       Objective/Physical Exam General: The patient is alert and oriented x3 in no acute distress.  Dermatology:  Wound noted to the plantar aspect of the left great toe measuring approximately 0.5 x 0.5 x 0.2 cm.  Please see above noted photo To the noted ulceration(s), there is no eschar. There is a moderate amount of slough, fibrin, and necrotic tissue noted. Granulation tissue and wound base is red. There is a minimal amount of serosanguineous drainage noted. There is no exposed bone muscle-tendon ligament or joint. There is no malodor. Periwound integrity is intact. Skin is warm, dry and supple bilateral lower extremities.  Vascular: Pulses are palpable.  Skin is warm to touch.  Clinically there is no concern for vascular compromise  VAS [place] ABI WITH/WO TBI 09/14/2021 Summary:  Right: Resting right ankle-brachial index is within normal range. No  evidence of significant right lower extremity arterial disease. The right  toe-brachial index is normal.   Left: Resting left ankle-brachial index is within normal range. No  evidence of significant left lower extremity arterial disease. The left  toe-brachial index is normal.   Neurological: Epicritic and protective threshold diminished bilaterally.   Musculoskeletal Exam: Pes planovalgus deformity noted  Radiographic exam LT foot 09/04/2021: Diffuse degenerative changes noted throughout the foot.  No fractures identified.  Normal osseous mineralization.  Absence of the fifth metatarsal head noted from prior surgery.  Assessment: 1.  Ulcer left great toe secondary to diabetes mellitus 2. diabetes mellitus w/  peripheral neuropathy  Plan of Care:  1. Patient was evaluated.   2. medically necessary excisional debridement including subcutaneous tissue was performed using a tissue nipper and a chisel blade. Excisional debridement of all the necrotic nonviable tissue down to healthy bleeding viable tissue was performed with post-debridement measurements same as pre-. 3. the wound was cleansed and dry sterile dressing applied. 4.  Continue gentamicin cream daily with a light dressing.  Refill sent to the pharmacy 5.  Continue postsurgical shoe with offloading felt dancers pad to the insole to offload pressure from the great toe 6.  Custom diabetic shoes and insoles pending.  Patient already molded  7.  Patient is concerned for increase of pain and possible infection.  Clinically there does not appear to be any indication of cellulitis or underlying infection.  Just to be safe prescription for doxycycline 100 mg 2 times daily #20 sent to the pharmacy  8.  Return to clinic 3 weeks   Edrick Kins, DPM [facility]  Dr. Edrick Kins, DPM    [address], [place] [address]                Office [phone]  Fax [phone]

## 2022-03-29 ENCOUNTER — Other Ambulatory Visit: Payer: Self-pay | Admitting: Internal Medicine

## 2022-04-01 NOTE — Telephone Encounter (Signed)
Requested Prescriptions  Pending Prescriptions Disp Refills  . lisinopril (ZESTRIL) 5 MG tablet [Pharmacy Med Name: LISINOPRIL 5 MG TABLET] 30 tablet 0    Sig: TAKE 1 TABLET (5 MG TOTAL) BY MOUTH DAILY.     Cardiovascular:  ACE Inhibitors Failed - 04/01/2022  5:16 PM      Failed - Cr in normal range and within 180 days    Creat  Date Value Ref Range Status  09/20/2021 1.13 0.70 - 1.28 mg/dL Final   Creatinine,U  Date Value Ref Range Status  03/20/2017 115.3 mg/dL Final   Creatinine, Urine  Date Value Ref Range Status  09/20/2021 209 20 - 320 mg/dL Final         Failed - K in normal range and within 180 days    Potassium  Date Value Ref Range Status  09/20/2021 5.6 (H) 3.5 - 5.3 mmol/L Final         Failed - Last BP in normal range    BP Readings from Last 1 Encounters:  09/20/21 (!) 148/72         Passed - Patient is not pregnant      Passed - Valid encounter within last 6 months    Recent Outpatient Visits          6 months ago Encounter for general adult medical examination with abnormal findings   [place], NP   9 months ago Aortic atherosclerosis [facility])   [facility] [place], Coralie Keens, NP   1 year ago Sacral pain   [facility] [place], Coralie Keens, [place]

## 2022-04-01 NOTE — Telephone Encounter (Signed)
Wife wants [place] to call daughter to schedule appt so his med request could be filled. Called [name], daughter. [name] stated that [name] comes to the house to see her dad sometimes. She then stated that she has an appt 04/12/22 at [place] and wants to know if Reginia can just see her father at the same time. I told [name] I would forward the question.

## 2022-04-02 DIAGNOSIS — C44629 Squamous cell carcinoma of skin of left upper limb, including shoulder: Secondary | ICD-10-CM | POA: Diagnosis not present

## 2022-04-04 ENCOUNTER — Other Ambulatory Visit: Payer: Self-pay | Admitting: Internal Medicine

## 2022-04-05 NOTE — Telephone Encounter (Signed)
Requested medication (s) are due for refill today:   Provider to review  Requested medication (s) are on the active medication list:   Yes  Future visit scheduled:   No   Last ordered: 02/15/2022 #45, 0 refills  Returned because it's a non delegated refill.    Requested Prescriptions  Pending Prescriptions Disp Refills   LORazepam (ATIVAN) 0.5 MG tablet [Pharmacy Med Name: LORAZEPAM 0.5 MG TABLET] 45 tablet 0    Sig: TAKE 1 TO 2 TABLETS BY MOUTH TWICE A DAY AS NEEDED     Not Delegated - Psychiatry: Anxiolytics/Hypnotics 2 Failed - 04/04/2022 11:31 AM      Failed - This refill cannot be delegated      Failed - Urine Drug Screen completed in last 360 days      Passed - Patient is not pregnant      Passed - Valid encounter within last 6 months    Recent Outpatient Visits           6 months ago Encounter for general adult medical examination with abnormal findings   [place], NP   9 months ago Aortic atherosclerosis [facility])   [facility] [place], Coralie Keens, NP   1 year ago Sacral pain   [facility] [place], Coralie Keens, NP

## 2022-04-07 ENCOUNTER — Other Ambulatory Visit: Payer: Self-pay | Admitting: Internal Medicine

## 2022-04-08 NOTE — Telephone Encounter (Signed)
Requested Prescriptions  Pending Prescriptions Disp Refills  . citalopram (CELEXA) 20 MG tablet [Pharmacy Med Name: CITALOPRAM HYDROBROMIDE 20 MG Tablet] 90 tablet 0    Sig: TAKE 1 TABLET EVERY DAY     Psychiatry:  Antidepressants - SSRI Passed - 04/07/2022  3:09 AM      Passed - Completed PHQ-2 or PHQ-9 in the last 360 days      Passed - Valid encounter within last 6 months    Recent Outpatient Visits          6 months ago Encounter for general adult medical examination with abnormal findings   [facility] [place], Coralie Keens, NP   9 months ago Aortic atherosclerosis [facility])   [facility] [place], Coralie Keens, NP   1 year ago Sacral pain   [facility] [place], Coralie Keens, NP

## 2022-04-11 IMAGING — CT CT L SPINE W/ CM
1 of 8 series · 5 of 14 positions shown, 7 images · non-contrast
Comparison: Lumbar myelogram 06/04/2018

CLINICAL DATA: Bilateral low back pain and right greater than left
leg pain. Prior lumbar fusion.
TECHNIQUE: Contiguous axial images were obtained through the Lumbar spine after
the intrathecal infusion of infusion. Coronal and sagittal
reconstructions were obtained of the axial image sets.

[Series 3: l spine soft · axial · 0.44mm/px · z∈[-352,-160]mm · 5 of 96 slices shown, 7 images]
[im 16/96  soft-tissue]
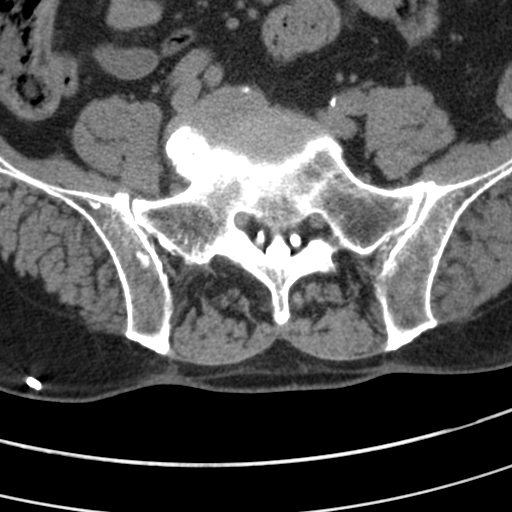
[im 16/96  bone]
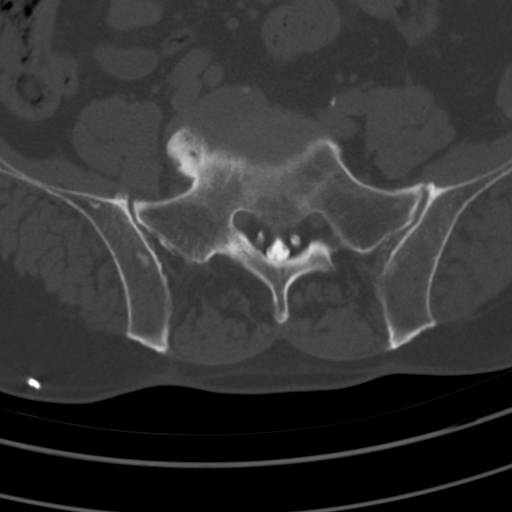
[im 32/96  bone]
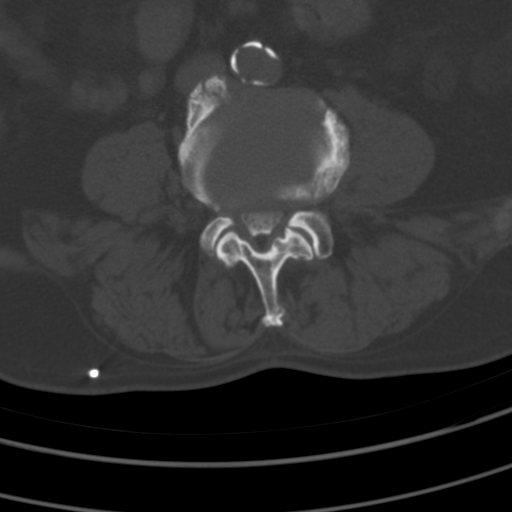
[im 48/96  bone]
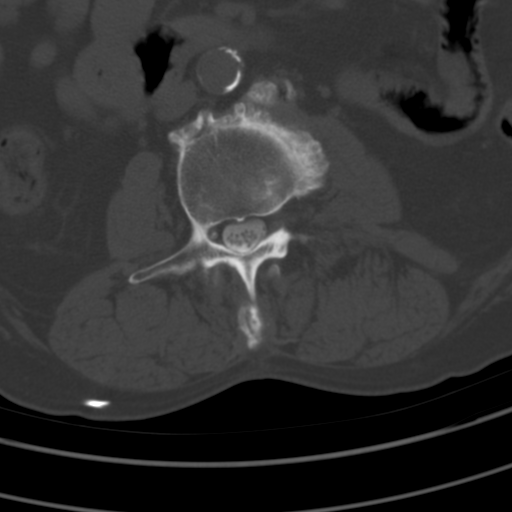
[im 64/96  bone]
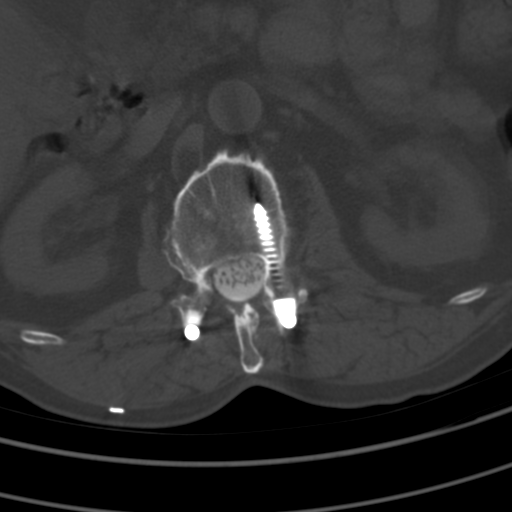
[im 80/96  soft-tissue]
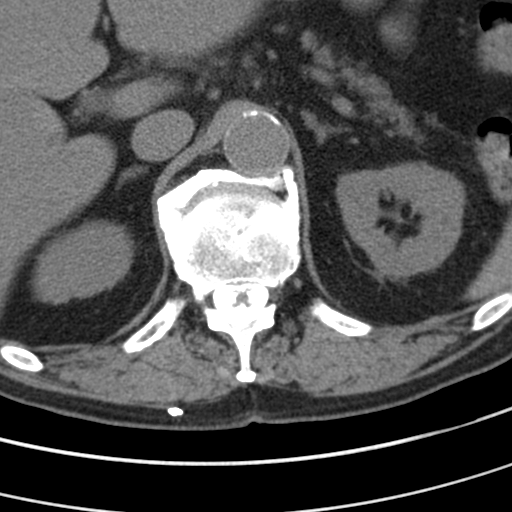
[im 80/96  bone]
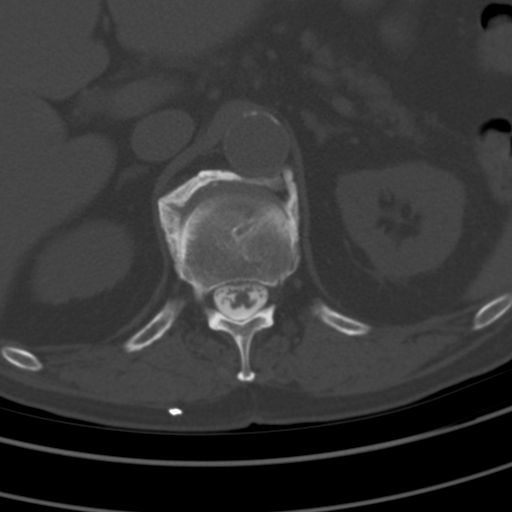

[5 of 14 positions shown; findings below may reference images not displayed]

EXAM:
LUMBAR MYELOGRAM

FLUOROSCOPY TIME:  Fluoroscopy Time: 1 minute 50 seconds

Radiation Exposure Index: 391.97 microGray*m^2

PROCEDURE:
After thorough discussion of risks and benefits of the procedure
including bleeding, infection, injury to nerves, blood vessels,
adjacent structures as well as headache and CSF leak, written and
oral informed consent was obtained. Consent was obtained by Dr.
Huguette Ava. Time out form was completed.

Patient was positioned prone on the fluoroscopy table. Local
anesthesia was provided with 1% lidocaine without epinephrine after
prepped and draped in the usual sterile fashion. Puncture was
performed at L3-4 using a 3 1/2 inch 22-gauge spinal needle via a
left interlaminar approach. Using a single pass through the dura,
the needle was placed within the thecal sac, with return of clear
CSF. 15 mL of Isovue 7-0VV was injected into the thecal sac, with
normal opacification of the nerve roots and cauda equina consistent
with free flow within the subarachnoid space.

I personally performed the lumbar puncture and administered the
intrathecal contrast. I also personally supervised acquisition of
the myelogram images.
FINDINGS: LUMBAR MYELOGRAM FINDINGS:

There are 5 non rib-bearing lumbar type vertebrae. There is mild
lumbar dextroscoliosis. No significant listhesis is seen in the
sagittal plane, and no abnormal motion is evident on upright flexion
or extension radiographs.

Sequelae of L1-2 fusion are identified with wide patency of the
spinal canal at this level. Ventral extradural defects are again
seen from L2-3 to L5-S1 with generally mild spinal stenosis at these
levels. Associated nerve root effacement is most notable of the S1
nerve roots.

CT LUMBAR MYELOGRAM FINDINGS:

Vertebral alignment is unchanged from the prior CT without
significant listhesis. A remote L1 compression fracture is again
noted status post augmentation. There has been prior L1-2 PLIF with
solid interbody osseous fusion. L1-2 pedicle screws appear
well-positioned without evidence of loosening. No acute fracture or
destructive osseous process is identified. Anterior vertebral
osteophytes are present throughout the lumbar and lower thoracic
spine, particularly bulky from T11-L1 and L3-S1. The lumbar spinal
canal is mildly small in caliber diffusely due to congenitally short
pedicles.

The conus medullaris terminates at the upper L1 level. Abdominal
aortic atherosclerosis is noted without aneurysm. Spinal cord
stimulator leads are partially visualized coursing through the
subcutaneous soft tissues of the back.

T11-12: Mild disc bulging and facet and ligamentum flavum
hypertrophy result in mild spinal stenosis without neural foraminal
stenosis, unchanged.

T12-L1: Mild disc bulging without stenosis, unchanged.

L1-2: Previous posterior decompression and fusion. No stenosis.

L2-3: Circumferential disc bulging, a right far lateral disc
osteophyte complex, and mild-to-moderate facet and ligamentum flavum
hypertrophy result in mild spinal stenosis, mild left greater than
right lateral recess stenosis, and mild to moderate right neural
foraminal stenosis, unchanged.

L3-4: Circumferential disc bulging and mild-to-moderate facet and
ligamentum flavum hypertrophy result in mild spinal stenosis,
mild-to-moderate bilateral lateral recess stenosis, and mild to
moderate bilateral neural foraminal stenosis, unchanged. The L4
nerve roots could be affected in the lateral recesses.

L4-5: Circumferential disc bulging and moderate facet and ligamentum
flavum hypertrophy result in mild spinal stenosis, mild-to-moderate
bilateral lateral recess stenosis, and mild right and moderate to
severe left neural foraminal stenosis with potential left L4 nerve
root impingement, unchanged.

L5-S1: Circumferential disc bulging eccentric to the right with
bulky right far lateral osteophytes and mild-to-moderate facet and
ligamentum flavum hypertrophy result in moderate right greater than
left lateral recess stenosis and moderate right greater than left
neural foraminal stenosis, unchanged and potentially affecting the
L5 and S1 nerve roots bilaterally.
IMPRESSION: 1. Unchanged diffuse lumbar disc and facet degeneration.
2. Moderate bilateral lateral recess and neural foraminal stenosis
at L5-S1.
3. Mild-to-moderate lateral recess stenosis at L3-4 and L4-5.
4. Moderate to severe left neural foraminal stenosis at L4-5.
5. Solid L1-2 fusion without stenosis.
6.  Aortic Atherosclerosis ([id number]).

## 2022-04-17 ENCOUNTER — Ambulatory Visit: Payer: Medicare HMO | Admitting: Podiatry

## 2022-04-17 DIAGNOSIS — L97522 Non-pressure chronic ulcer of other part of left foot with fat layer exposed: Secondary | ICD-10-CM | POA: Diagnosis not present

## 2022-04-17 DIAGNOSIS — E0843 Diabetes mellitus due to underlying condition with diabetic autonomic (poly)neuropathy: Secondary | ICD-10-CM | POA: Diagnosis not present

## 2022-04-17 NOTE — Progress Notes (Signed)
Chief Complaint  Patient presents with   Follow-up    Patient is here for left foot follow-up ulcer, patient states that the foot is still havin some pain but not as bad.    Subjective:  76 y.o. male with PMHx of diabetes mellitus presenting today for follow-up evaluation of an ulcer to the right great toe.  Patient states that they have been applying the antibiotic cream and a light dressing as instructed.  Patient states that he has noticed some improvement and reduction in the pain.  No new complaints at this time  Past Medical History:  Diagnosis Date   Arthritis    COPD (chronic obstructive pulmonary disease) (HCC)    Depression    Dyspnea    Erectile dysfunction    He's had a hx of    Hypercholesterolemia    Hypertension    Essential   Lumbosacral disc disease    with chronic low back pain, has implanted nerve stimulator   Peripheral neuropathy    Presenile dementia    Type 2 diabetes mellitus ([place]) 2010   takes oral meds   Past Surgical History:  Procedure Laterality Date   ANTERIOR CERVICAL DECOMP/DISCECTOMY FUSION     APPENDECTOMY     HEMORRHOID SURGERY     x2   JOINT REPLACEMENT Left    2nd one was to change siZe   KNEE SURGERY     Previous left total replacement   KYPHOPLASTY N/A 12/10/2012   Procedure: Lumbar one Kyphoplasty;  Surgeon: Kristeen Miss, MD;  Location: [facility];  Service: Neurosurgery;  Laterality: N/A;  Lumbar One Kyphoplasty   LUMBAR LAMINECTOMY/DECOMPRESSION MICRODISCECTOMY Bilateral 01/25/2021   Procedure: Bilateral Lumbar 4-5, Lumbar 5 Sacral 1 Laminectomy/Foraminotomy;  Surgeon: Erline Levine, MD;  Location: [place];  Service: Neurosurgery;  Laterality: Bilateral;  3C/RM 21   OTHER SURGICAL HISTORY  1992   Bone Fusion in Right Foot   SPINAL CORD STIMULATOR INSERTION N/A 12/02/2014   Procedure: THORACIC SPINAL CORD STIMULATOR INSERTION INFINION BOSTON [employer]ENTIFIC 16 LEAD WITH LAMINECTOMY;  Surgeon: Erline Levine, MD;  Location: [place];   Service: Neurosurgery;  Laterality: N/A;  THORACIC SPINAL CORD STIMULATOR INSERTION INFINION BOSTON [employer]ENTIFIC 16 LEAD WITH LAMINECTOMY   SPINAL CORD STIMULATOR REMOVAL N/A 10/19/2020   Procedure: Removal of spinal cord stimulator with paddle lead;  Surgeon: Erline Levine, MD;  Location: [facility];  Service: Neurosurgery;  Laterality: N/A;   TOTAL KNEE ARTHROPLASTY Left    Allergies  Allergen Reactions   Codeine Hives and Itching   Objective/Physical Exam General: The patient is alert and oriented x3 in no acute distress.  Dermatology:  Overall improvement of the wound.  Wound noted to the plantar aspect of the left great toe measuring approximately 0.4 x 0.4 x 0.1 cm.  Please see above noted photo To the noted ulceration(s), there is no eschar. There is a moderate amount of slough, fibrin, and necrotic tissue noted. Granulation tissue and wound base is red. There is a minimal amount of serosanguineous drainage noted. There is no exposed bone muscle-tendon ligament or joint. There is no malodor. Periwound integrity is intact. Skin is warm, dry and supple bilateral lower extremities.  Vascular: Pulses are palpable.  Skin is warm to touch.  Clinically there is no concern for vascular compromise  VAS [place] ABI WITH/WO TBI 09/14/2021 Summary:  Right: Resting right ankle-brachial index is within normal range. No  evidence of significant right lower extremity arterial disease. The right  toe-brachial index  is normal.   Left: Resting left ankle-brachial index is within normal range. No  evidence of significant left lower extremity arterial disease. The left  toe-brachial index is normal.   Neurological: Epicritic and protective threshold diminished bilaterally.   Musculoskeletal Exam: Pes planovalgus deformity noted  Radiographic exam LT foot 09/04/2021: Diffuse degenerative changes noted throughout the foot.  No fractures identified.  Normal osseous mineralization.  Absence of the fifth metatarsal  head noted from prior surgery.  Assessment: 1.  Ulcer left great toe secondary to diabetes mellitus 2. diabetes mellitus w/ peripheral neuropathy  Plan of Care:  1. Patient was evaluated.   2. medically necessary excisional debridement including subcutaneous tissue was performed using a tissue nipper and a chisel blade. Excisional debridement of all the necrotic nonviable tissue down to healthy bleeding viable tissue was performed with post-debridement measurements same as pre-. 3. the wound was cleansed and dry sterile dressing applied. 4.  Continue gentamicin cream daily with a light dressing.   5.  Continue postsurgical shoe with offloading felt dancers pad to the insole to offload pressure from the great toe 6.  Custom diabetic shoes and insoles pending.  Patient already molded  7.  Return to clinic 4 weeks  Alexander Booth, DPM [facility]  Dr. Edrick Booth, DPM    [address], [place] [address]                Office [phone]  Fax [phone]

## 2022-04-21 ENCOUNTER — Other Ambulatory Visit: Payer: Self-pay | Admitting: Internal Medicine

## 2022-04-22 NOTE — Telephone Encounter (Signed)
Requested medication (s) are due for refill today:   Yes for both  Requested medication (s) are on the active medication list:   Yes for both  Future visit scheduled:   No   Last ordered: Both 11/26/2021 #180, 0 refills  Returned because labs due per protocol   Requested Prescriptions  Pending Prescriptions Disp Refills   metFORMIN (GLUCOPHAGE) 500 MG tablet [Pharmacy Med Name: METFORMIN HYDROCHLORIDE 500 MG Tablet] 180 tablet 0    Sig: TAKE 1 TABLET TWICE DAILY WITH A MEAL     Endocrinology:  Diabetes - Biguanides Failed - 04/21/2022  4:02 AM      Failed - HBA1C is between 0 and 7.9 and within 180 days    Hemoglobin A1C  Date Value Ref Range Status  09/20/2021 7.4 (A) 4.0 - 5.6 % Final   Hgb A1c MFr Bld  Date Value Ref Range Status  01/23/2021 7.0 (H) 4.8 - 5.6 % Final    Comment:    (NOTE)         Prediabetes: 5.7 - 6.4         Diabetes: >6.4         Glycemic control for adults with diabetes: <7.0          Failed - B12 Level in normal range and within 720 days    Vitamin B-12  Date Value Ref Range Status  12/21/2019 325 180 - 914 pg/mL Final    Comment:    (NOTE) This assay is not validated for testing neonatal or myeloproliferative syndrome specimens for Vitamin B12 levels. Performed at  [facility], [address] [place], [place] [address]          Failed - CBC within normal limits and completed in the last 12 months    WBC  Date Value Ref Range Status  09/20/2021 6.1 3.8 - 10.8 Thousand/uL Final   RBC  Date Value Ref Range Status  09/20/2021 4.40 4.20 - 5.80 Million/uL Final   Hemoglobin  Date Value Ref Range Status  09/20/2021 14.1 13.2 - 17.1 g/dL Final   HCT  Date Value Ref Range Status  09/20/2021 42.5 38.5 - 50.0 % Final   MCHC  Date Value Ref Range Status  09/20/2021 33.2 32.0 - 36.0 g/dL Final   MCH  Date Value Ref Range Status  09/20/2021 32.0 27.0 - 33.0 pg Final   MCV  Date Value Ref Range Status  09/20/2021 96.6 80.0 -  100.0 fL Final   No results found for: "PLTCOUNTKUC", "LABPLAT", "POCPLA" RDW  Date Value Ref Range Status  09/20/2021 11.9 11.0 - 15.0 % Final  10/11/2014 13.5 12.3 - 15.4 % Final         Passed - Cr in normal range and within 360 days    Creat  Date Value Ref Range Status  09/20/2021 1.13 0.70 - 1.28 mg/dL Final   Creatinine,U  Date Value Ref Range Status  03/20/2017 115.3 mg/dL Final   Creatinine, Urine  Date Value Ref Range Status  09/20/2021 209 20 - 320 mg/dL Final         Passed - eGFR in normal range and within 360 days    GFR calc Af Amer  Date Value Ref Range Status  12/25/2019 >60 >60 mL/min Final   GFR, Estimated  Date Value Ref Range Status  01/23/2021 >60 >60 mL/min Final    Comment:    (NOTE) Calculated using the CKD-EPI Creatinine Equation (2021)    GFR    Date Value Ref Range Status  01/06/2020 59.40 (L) >60.00 mL/min Final   eGFR  Date Value Ref Range Status  09/20/2021 67 > OR = 60 mL/min/1.45m Final    Comment:    The eGFR is based on the CKD-EPI 2021 equation. To calculate  the new eGFR from a previous Creatinine or Cystatin C result, go to https://www.kidney.org/professionals/ kdoqi/gfr%5Fcalculator          Passed - Valid encounter within last 6 months    Recent Outpatient Visits           7 months ago Encounter for general adult medical examination with abnormal findings   [facility]   10 months ago Aortic atherosclerosis ([facility]   [facility] [place]ie Keens NP   1 year ago Sacral pain   [facility]               mirtazapine (REMERON) 45 MG tablet [Pharmacy Med Name: MIRTAZAPINE 45 MG Tablet] 90 tablet 0    Sig: TAKE 1 TABLET AT BEDTIME     Psychiatry: Antidepressants - mirtazapine Passed - 04/21/2022  4:02 AM      Passed - Completed PHQ-2 or PHQ-9 in the last 360 days      Passed - Valid encounter within last 6 months    Recent  Outpatient Visits           7 months ago Encounter for general adult medical examination with abnormal findings   [place] NP   10 months ago Aortic atherosclerosis ([facility]   [facility]   1 year ago Sacral pain   [facility] [place]

## 2022-05-03 DIAGNOSIS — H0102B Squamous blepharitis left eye, upper and lower eyelids: Secondary | ICD-10-CM | POA: Diagnosis not present

## 2022-05-03 DIAGNOSIS — E119 Type 2 diabetes mellitus without complications: Secondary | ICD-10-CM | POA: Diagnosis not present

## 2022-05-03 DIAGNOSIS — H35373 Puckering of macula, bilateral: Secondary | ICD-10-CM | POA: Diagnosis not present

## 2022-05-03 DIAGNOSIS — H0102A Squamous blepharitis right eye, upper and lower eyelids: Secondary | ICD-10-CM | POA: Diagnosis not present

## 2022-05-03 DIAGNOSIS — H35033 Hypertensive retinopathy, bilateral: Secondary | ICD-10-CM | POA: Diagnosis not present

## 2022-05-03 DIAGNOSIS — Z961 Presence of intraocular lens: Secondary | ICD-10-CM | POA: Diagnosis not present

## 2022-05-03 DIAGNOSIS — H3562 Retinal hemorrhage, left eye: Secondary | ICD-10-CM | POA: Diagnosis not present

## 2022-05-03 LAB — HM DIABETES EYE EXAM

## 2022-05-06 ENCOUNTER — Other Ambulatory Visit: Payer: Self-pay | Admitting: Internal Medicine

## 2022-05-07 NOTE — Telephone Encounter (Signed)
Requested medication (s) are due for refill today: yes  Requested medication (s) are on the active medication list: yes  Last refill:  04/01/22 #30  Future visit scheduled: no  Notes to clinic:  overdue lab work, requesting 90 day refill   Requested Prescriptions  Pending Prescriptions Disp Refills   lisinopril (ZESTRIL) 5 MG tablet [Pharmacy Med Name: LISINOPRIL 5 MG TABLET] 90 tablet 1    Sig: TAKE 1 TABLET (5 MG TOTAL) BY MOUTH DAILY.     Cardiovascular:  ACE Inhibitors Failed - 05/06/2022 12:30 PM      Failed - Cr in normal range and within 180 days    Creat  Date Value Ref Range Status  09/20/2021 1.13 0.70 - 1.28 mg/dL Final   Creatinine,U  Date Value Ref Range Status  03/20/2017 115.3 mg/dL Final   Creatinine, Urine  Date Value Ref Range Status  09/20/2021 209 20 - 320 mg/dL Final         Failed - K in normal range and within 180 days    Potassium  Date Value Ref Range Status  09/20/2021 5.6 (H) 3.5 - 5.3 mmol/L Final         Failed - Last BP in normal range    BP Readings from Last 1 Encounters:  09/20/21 (!) 148/72         Passed - Patient is not pregnant      Passed - Valid encounter within last 6 months    Recent Outpatient Visits           7 months ago Encounter for general adult medical examination with abnormal findings   [place], Coralie Keens, NP   10 months ago Aortic atherosclerosis [facility])   [facility] [place], Coralie Keens, NP   1 year ago Sacral pain   [facility] [place], Coralie Keens, [place]

## 2022-05-09 ENCOUNTER — Other Ambulatory Visit: Payer: Self-pay | Admitting: Internal Medicine

## 2022-05-09 DIAGNOSIS — G4700 Insomnia, unspecified: Secondary | ICD-10-CM

## 2022-05-09 DIAGNOSIS — F03918 Unspecified dementia, unspecified severity, with other behavioral disturbance: Secondary | ICD-10-CM

## 2022-05-09 NOTE — Telephone Encounter (Signed)
Requested medication (s) are due for refill today: yes  Requested medication (s) are on the active medication list: yes  Last refill:  04/05/22 #45/0  Future visit scheduled: no  Notes to clinic:  Unable to refill per protocol, cannot delegate. Pt called, LVMTCB to schedule appt      Requested Prescriptions  Pending Prescriptions Disp Refills   LORazepam (ATIVAN) 0.5 MG tablet [Pharmacy Med Name: LORAZEPAM 0.5 MG TABLET] 45 tablet 0    Sig: TAKE 1 TO 2 TABLETS BY MOUTH TWICE A DAY AS NEEDED     Not Delegated - Psychiatry: Anxiolytics/Hypnotics 2 Failed - 05/09/2022 10:55 AM      Failed - This refill cannot be delegated      Failed - Urine Drug Screen completed in last 360 days      Passed - Patient is not pregnant      Passed - Valid encounter within last 6 months    Recent Outpatient Visits           7 months ago Encounter for general adult medical examination with abnormal findings   [facility] [place], Coralie Keens, NP   10 months ago Aortic atherosclerosis [facility])   [facility] [place], Coralie Keens, NP   1 year ago Sacral pain   [facility] [place], Coralie Keens, NP              Signed Prescriptions Disp Refills   lisinopril (ZESTRIL) 5 MG tablet 30 tablet 0    Sig: TAKE 1 TABLET (5 MG TOTAL) BY MOUTH DAILY.     Cardiovascular:  ACE Inhibitors Failed - 05/09/2022 10:55 AM      Failed - Cr in normal range and within 180 days    Creat  Date Value Ref Range Status  09/20/2021 1.13 0.70 - 1.28 mg/dL Final   Creatinine,U  Date Value Ref Range Status  03/20/2017 115.3 mg/dL Final   Creatinine, Urine  Date Value Ref Range Status  09/20/2021 209 20 - 320 mg/dL Final         Failed - K in normal range and within 180 days    Potassium  Date Value Ref Range Status  09/20/2021 5.6 (H) 3.5 - 5.3 mmol/L Final         Failed - Last BP in normal range    BP Readings from Last 1 Encounters:  09/20/21 (!) 148/72         Passed -  Patient is not pregnant      Passed - Valid encounter within last 6 months    Recent Outpatient Visits           7 months ago Encounter for general adult medical examination with abnormal findings   [facility] [place], Coralie Keens, NP   10 months ago Aortic atherosclerosis [facility])   [facility] [place], Coralie Keens, NP   1 year ago Sacral pain   [facility] [place], [place] W, NP               traZODone (DESYREL) 50 MG tablet 30 tablet 0    Sig: TAKE 1 TABLET BY MOUTH EVERYDAY AT BEDTIME     Psychiatry: Antidepressants - Serotonin Modulator Passed - 05/09/2022 10:55 AM      Passed - Completed PHQ-2 or PHQ-9 in the last 360 days      Passed - Valid encounter within last 6 months    Recent Outpatient Visits  7 months ago Encounter for general adult medical examination with abnormal findings   [facility] [place], Coralie Keens, NP   10 months ago Aortic atherosclerosis [facility])   [facility], Coralie Keens, NP   1 year ago Sacral pain   [facility] [place], [place] W, NP               memantine (NAMENDA XR) 7 MG CP24 24 hr capsule 30 capsule 0    Sig: TAKE 1 CAPSULE (7 MG TOTAL) BY MOUTH EVERY MORNING.     Neurology:  Alzheimer's Agents 2 Passed - 05/09/2022 10:55 AM      Passed - Cr in normal range and within 360 days    Creat  Date Value Ref Range Status  09/20/2021 1.13 0.70 - 1.28 mg/dL Final   Creatinine,U  Date Value Ref Range Status  03/20/2017 115.3 mg/dL Final   Creatinine, Urine  Date Value Ref Range Status  09/20/2021 209 20 - 320 mg/dL Final         Passed - eGFR is 5 or above and within 360 days    GFR calc Af Amer  Date Value Ref Range Status  12/25/2019 >60 >60 mL/min Final   GFR, Estimated  Date Value Ref Range Status  01/23/2021 >60 >60 mL/min Final    Comment:    (NOTE) Calculated using the CKD-EPI Creatinine Equation (2021)    GFR  Date Value Ref  Range Status  01/06/2020 59.40 (L) >60.00 mL/min Final   eGFR  Date Value Ref Range Status  09/20/2021 67 > OR = 60 mL/min/1.17m Final    Comment:    The eGFR is based on the CKD-EPI 2021 equation. To calculate  the new eGFR from a previous Creatinine or Cystatin C result, go to https://www.kidney.org/professionals/ kdoqi/gfr%5Fcalculator          Passed - Valid encounter within last 6 months    Recent Outpatient Visits           7 months ago Encounter for general adult medical examination with abnormal findings   [place] NP   10 months ago Aortic atherosclerosis ([facility]   [facility] RC[facility]ie Ke[facility] NP   1 year ago Sacral pain   [facility] [place]

## 2022-05-09 NOTE — Telephone Encounter (Signed)
Patient called, left VM to return the call to the office to scheduled an appt for medication refill request.   

## 2022-05-09 NOTE — Telephone Encounter (Signed)
Courtesy refill. Pt needs to schedule appt.   Requested Prescriptions  Pending Prescriptions Disp Refills  . LORazepam (ATIVAN) 0.5 MG tablet [Pharmacy Med Name: LORAZEPAM 0.5 MG TABLET] 45 tablet 0    Sig: TAKE 1 TO 2 TABLETS BY MOUTH TWICE A DAY AS NEEDED     Not Delegated - Psychiatry: Anxiolytics/Hypnotics 2 Failed - 05/09/2022 10:55 AM      Failed - This refill cannot be delegated      Failed - Urine Drug Screen completed in last 360 days      Passed - Patient is not pregnant      Passed - Valid encounter within last 6 months    Recent Outpatient Visits          7 months ago Encounter for general adult medical examination with abnormal findings   [facility] [place], Coralie Keens, NP   10 months ago Aortic atherosclerosis [facility])   [facility] [place], Coralie Keens, NP   1 year ago Sacral pain   [place], NP             . lisinopril (ZESTRIL) 5 MG tablet [Pharmacy Med Name: LISINOPRIL 5 MG TABLET] 30 tablet 0    Sig: TAKE 1 TABLET (5 MG TOTAL) BY MOUTH DAILY.     Cardiovascular:  ACE Inhibitors Failed - 05/09/2022 10:55 AM      Failed - Cr in normal range and within 180 days    Creat  Date Value Ref Range Status  09/20/2021 1.13 0.70 - 1.28 mg/dL Final   Creatinine,U  Date Value Ref Range Status  03/20/2017 115.3 mg/dL Final   Creatinine, Urine  Date Value Ref Range Status  09/20/2021 209 20 - 320 mg/dL Final         Failed - K in normal range and within 180 days    Potassium  Date Value Ref Range Status  09/20/2021 5.6 (H) 3.5 - 5.3 mmol/L Final         Failed - Last BP in normal range    BP Readings from Last 1 Encounters:  09/20/21 (!) 148/72         Passed - Patient is not pregnant      Passed - Valid encounter within last 6 months    Recent Outpatient Visits          7 months ago Encounter for general adult medical examination with abnormal findings   [facility] [place],  Coralie Keens, NP   10 months ago Aortic atherosclerosis [facility])   [facility] [place], Coralie Keens, NP   1 year ago Sacral pain   [facility] [place], [place] W, NP             . traZODone (DESYREL) 50 MG tablet [Pharmacy Med Name: TRAZODONE 50 MG TABLET] 90 tablet 0    Sig: TAKE 1 TABLET BY MOUTH EVERYDAY AT BEDTIME     Psychiatry: Antidepressants - Serotonin Modulator Passed - 05/09/2022 10:55 AM      Passed - Completed PHQ-2 or PHQ-9 in the last 360 days      Passed - Valid encounter within last 6 months    Recent Outpatient Visits          7 months ago Encounter for general adult medical examination with abnormal findings   [facility] [place], Coralie Keens, NP   10 months ago Aortic atherosclerosis ([place])  [facility] [place], Coralie Keens, NP   1 year ago Sacral pain   [facility] [place], [place] W, NP             . memantine (NAMENDA XR) 7 MG CP24 24 hr capsule [Pharmacy Med Name: MEMANTINE HCL ER 7 MG CAPSULE] 90 capsule 0    Sig: TAKE 1 CAPSULE (7 MG TOTAL) BY MOUTH EVERY MORNING.     Neurology:  Alzheimer's Agents 2 Passed - 05/09/2022 10:55 AM      Passed - Cr in normal range and within 360 days    Creat  Date Value Ref Range Status  09/20/2021 1.13 0.70 - 1.28 mg/dL Final   Creatinine,U  Date Value Ref Range Status  03/20/2017 115.3 mg/dL Final   Creatinine, Urine  Date Value Ref Range Status  09/20/2021 209 20 - 320 mg/dL Final         Passed - eGFR is 5 or above and within 360 days    GFR calc Af Amer  Date Value Ref Range Status  12/25/2019 >60 >60 mL/min Final   GFR, Estimated  Date Value Ref Range Status  01/23/2021 >60 >60 mL/min Final    Comment:    (NOTE) Calculated using the CKD-EPI Creatinine Equation (2021)    GFR  Date Value Ref Range Status  01/06/2020 59.40 (L) >60.00 mL/min Final   eGFR  Date Value Ref Range Status  09/20/2021 67 > OR = 60 mL/min/1.68m Final     Comment:    The eGFR is based on the CKD-EPI 2021 equation. To calculate  the new eGFR from a previous Creatinine or Cystatin C result, go to https://www.kidney.org/professionals/ kdoqi/gfr%5Fcalculator          Passed - Valid encounter within last 6 months    Recent Outpatient Visits          7 months ago Encounter for general adult medical examination with abnormal findings   SS[place] NP   10 months ago [facility]ortic [facility] ([facility]   [facility] [place] RC[facility]ie Keens NP   1 year ago Sacral pain   [facility] [facility]

## 2022-05-15 ENCOUNTER — Ambulatory Visit: Payer: Medicare HMO | Admitting: Podiatry

## 2022-05-15 DIAGNOSIS — E0843 Diabetes mellitus due to underlying condition with diabetic autonomic (poly)neuropathy: Secondary | ICD-10-CM | POA: Diagnosis not present

## 2022-05-15 DIAGNOSIS — L97522 Non-pressure chronic ulcer of other part of left foot with fat layer exposed: Secondary | ICD-10-CM | POA: Diagnosis not present

## 2022-05-15 MED ORDER — GENTAMICIN SULFATE 0.1 % EX CREA: 1.0000 | TOPICAL_CREAM | Freq: Two times a day (BID) | CUTANEOUS | 1 refills | Status: AC

## 2022-05-15 NOTE — Progress Notes (Signed)
Chief Complaint  Patient presents with   Follow-up    Patient is here for left foot ulcer follow-up.    Subjective:  76 y.o. male with PMHx of diabetes mellitus presenting today for follow-up evaluation of an ulcer to the right great toe.  Patient states that they have been applying the antibiotic cream and a light dressing as instructed.  Patient states that he has noticed some improvement and reduction in the pain.  No new complaints at this time  Past Medical History:  Diagnosis Date   Arthritis    COPD (chronic obstructive pulmonary disease) (HCC)    Depression    Dyspnea    Erectile dysfunction    He's had a hx of    Hypercholesterolemia    Hypertension    Essential   Lumbosacral disc disease    with chronic low back pain, has implanted nerve stimulator   Peripheral neuropathy    Presenile dementia    Type 2 diabetes mellitus ([place]) 2010   takes oral meds   Past Surgical History:  Procedure Laterality Date   ANTERIOR CERVICAL DECOMP/DISCECTOMY FUSION     APPENDECTOMY     HEMORRHOID SURGERY     x2   JOINT REPLACEMENT Left    2nd one was to change siZe   KNEE SURGERY     Previous left total replacement   KYPHOPLASTY N/A 12/10/2012   Procedure: Lumbar one Kyphoplasty;  Surgeon: Kristeen Miss, MD;  [facility];  Service: Neurosurgery;  Laterality: N/A;  Lumbar One Kyphoplasty   LUMBAR LAMINECTOMY/DECOMPRESSION MICRODISCECTOMY Bilateral 01/25/2021   Procedure: Bilateral Lumbar 4-5, Lumbar 5 Sacral 1 Laminectomy/Foraminotomy;  Surgeon: Erline Levine, MD;  Location: [place];  Service: Neurosurgery;  Laterality: Bilateral;  3C/RM 21   OTHER SURGICAL HISTORY  1992   Bone Fusion in Right Foot   SPINAL CORD STIMULATOR INSERTION N/A 12/02/2014   Procedure: THORACIC SPINAL CORD STIMULATOR INSERTION INFINION BOSTON SCIENTIFIC 16 LEAD WITH LAMINECTOMY;  Surgeon: Erline Levine, MD;  Location: [facility];  Service: Neurosurgery;  Laterality: N/A;  THORACIC SPINAL CORD  STIMULATOR INSERTION INFINION BOSTON SCIENTIFIC 16 LEAD WITH LAMINECTOMY   SPINAL CORD STIMULATOR REMOVAL N/A 10/19/2020   Procedure: Removal of spinal cord stimulator with paddle lead;  Surgeon: Erline Levine, MD;  Location: [facility];  Service: Neurosurgery;  Laterality: N/A;   TOTAL KNEE ARTHROPLASTY Left    Allergies  Allergen Reactions   Codeine Hives and Itching     Objective/Physical Exam General: The patient is alert and oriented x3 in no acute distress.  Dermatology:  Overall improvement of the wound.  Wound is stable. Plantar aspect of the left great toe measuring approximately 0.3 x 0.3 x 0.1 cm.  Please see above noted photo To the noted ulceration(s), there is no eschar. There is a moderate amount of slough, fibrin, and necrotic tissue noted. Granulation tissue and wound base is red. There is a minimal amount of serosanguineous drainage noted. There is no exposed bone muscle-tendon ligament or joint. There is no malodor. Periwound integrity is intact. Skin is warm, dry and supple bilateral lower extremities.  Vascular: Pulses are palpable.  Skin is warm to touch.  Clinically there is no concern for vascular compromise  VAS [place] ABI WITH/WO TBI 09/14/2021 Summary:  Right: Resting right ankle-brachial index is within normal range. No  evidence of significant right lower extremity arterial disease. The right  toe-brachial index is normal.   Left: Resting left ankle-brachial index is within normal range.  No  evidence of significant left lower extremity arterial disease. The left  toe-brachial index is normal.   Neurological: Epicritic and protective threshold diminished bilaterally.   Musculoskeletal Exam: Pes planovalgus deformity noted  Radiographic exam LT foot 09/04/2021: Diffuse degenerative changes noted throughout the foot.  No fractures identified.  Normal osseous mineralization.  Absence of the fifth metatarsal head noted from prior surgery.  Assessment: 1.  Ulcer left  great toe secondary to diabetes mellitus 2. diabetes mellitus w/ peripheral neuropathy  Plan of Care:  1. Patient was evaluated.   2. medically necessary excisional debridement including subcutaneous tissue was performed using a tissue nipper and a chisel blade. Excisional debridement of all the necrotic nonviable tissue down to healthy bleeding viable tissue was performed with post-debridement measurements same as pre-. 3. the wound was cleansed and dry sterile dressing applied. 4.  Continue gentamicin cream daily with a light dressing.  Reach goal provided 5.  Continue postsurgical shoe with offloading felt dancers pad to the insole to offload pressure from the great toe 6.  Custom diabetic shoes and insoles pending.  Patient already molded  7.  Return to clinic 4 weeks and for follow-up x-rays  Edrick Kins, DPM [facility]  Dr. Edrick Kins, DPM    [address], [place] [address]                Office [phone]  Fax [phone]

## 2022-05-16 DIAGNOSIS — M5136 Other intervertebral disc degeneration, lumbar region: Secondary | ICD-10-CM | POA: Diagnosis not present

## 2022-05-16 DIAGNOSIS — M546 Pain in thoracic spine: Secondary | ICD-10-CM | POA: Diagnosis not present

## 2022-05-16 DIAGNOSIS — F1120 Opioid dependence, uncomplicated: Secondary | ICD-10-CM | POA: Diagnosis not present

## 2022-05-17 ENCOUNTER — Encounter: Payer: Self-pay | Admitting: Internal Medicine

## 2022-05-20 NOTE — Telephone Encounter (Signed)
Can you add him on at 1140 for a home visit Wednesday, November 1?

## 2022-05-22 ENCOUNTER — Other Ambulatory Visit (INDEPENDENT_AMBULATORY_CARE_PROVIDER_SITE_OTHER): Payer: Medicare HMO | Admitting: Internal Medicine

## 2022-05-22 VITALS — BP 134/72 | HR 78 | Temp 98.1°F | Resp 20 | Wt 192.0 lb

## 2022-05-22 DIAGNOSIS — I700 Atherosclerosis of aorta: Secondary | ICD-10-CM

## 2022-05-22 DIAGNOSIS — E1142 Type 2 diabetes mellitus with diabetic polyneuropathy: Secondary | ICD-10-CM

## 2022-05-22 DIAGNOSIS — F02818 Dementia in other diseases classified elsewhere, unspecified severity, with other behavioral disturbance: Secondary | ICD-10-CM | POA: Diagnosis not present

## 2022-05-22 DIAGNOSIS — K219 Gastro-esophageal reflux disease without esophagitis: Secondary | ICD-10-CM | POA: Diagnosis not present

## 2022-05-22 DIAGNOSIS — F03918 Unspecified dementia, unspecified severity, with other behavioral disturbance: Secondary | ICD-10-CM

## 2022-05-22 DIAGNOSIS — G20B2 Parkinson's disease with dyskinesia, with fluctuations: Secondary | ICD-10-CM | POA: Diagnosis not present

## 2022-05-22 DIAGNOSIS — E118 Type 2 diabetes mellitus with unspecified complications: Secondary | ICD-10-CM

## 2022-05-22 DIAGNOSIS — G479 Sleep disorder, unspecified: Secondary | ICD-10-CM

## 2022-05-22 DIAGNOSIS — J449 Chronic obstructive pulmonary disease, unspecified: Secondary | ICD-10-CM

## 2022-05-22 DIAGNOSIS — I10 Essential (primary) hypertension: Secondary | ICD-10-CM | POA: Diagnosis not present

## 2022-05-22 DIAGNOSIS — E781 Pure hyperglyceridemia: Secondary | ICD-10-CM

## 2022-05-22 DIAGNOSIS — M549 Dorsalgia, unspecified: Secondary | ICD-10-CM

## 2022-05-22 DIAGNOSIS — G8929 Other chronic pain: Secondary | ICD-10-CM

## 2022-05-22 DIAGNOSIS — N401 Enlarged prostate with lower urinary tract symptoms: Secondary | ICD-10-CM

## 2022-05-22 DIAGNOSIS — N3943 Post-void dribbling: Secondary | ICD-10-CM

## 2022-05-22 DIAGNOSIS — G20A1 Parkinson's disease without dyskinesia, without mention of fluctuations: Secondary | ICD-10-CM | POA: Diagnosis not present

## 2022-05-22 NOTE — Progress Notes (Unsigned)
Subjective:    Patient ID: Alexander Booth, male    DOB: 06-10-1946, 76 y.o.   MRN: [id number]  HPI  Patient seen in his home for 92-monthfollow-up.  HTN: His BP today is.  He is taking Lisinopril as prescribed.  ECG from 01/2021 reviewed.  COPD: He reports chronic cough and shortness of breath.  He is using Breo and Albuterol as prescribed.  He is on oxygen only as needed.  There are no PFTs on file.  He follows with pulmonology.  GERD: He reports intermittent breakthrough on Famotidine but does not take any OTC medications for this.  There is no upper GI on file.  Dementia with Behavioral Disturbance: Chronic, managed on Depakote, Donepezil, Memantine, Citalopram, Lorazepam, trTazodone and Mirtazapine.  He is not currently seeing a therapist.  He does not follow with neurology.  DM2: His last A1c was 7.4%, 09/2021.  He is taking Metformin as prescribed.  He does not check his sugars.  He does not check his feet routinely.  His last eye exam was.  Flu 04/2019.  Pneumovax 04/2018.  Prevnar 09/2015.  [place].  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  He is taking Percocet, Cyclobenzaprine, Meloxicam and Gabapentin as prescribed.  He follows with pain management.  HLD with Aortic Atherosclerosis: His last LDL was 94, triglycerides 225, 09/2021.  He denies myalgias on Simvastatin.  He is taking Aspirin as well.  He tries to consume a low-fat diet.  Parkinson's: He reports tremors and stiffness.  He is taking Sinemet as prescribed.  He no longer follows with neurology  BPH: Mainly urinary frequency.  He is taking Flomax as prescribed.  He does not follow with urology.  Review of Systems     Past Medical History:  Diagnosis Date   Arthritis    [place] disease) [place]    Depression    Dyspnea    Erectile dysfunction    He's had a hx of    Hypercholesterolemia    Hypertension    Essential   Lumbosacral disc disease    with  chronic low back pain, has implanted nerve stimulator   Peripheral neuropathy    Presenile dementia    [place] 2 diabetes mellitus [place] 2010   takes oral meds    Current Outpatient Medications  Medication Sig Dispense Refill   Accu-Chek Softclix Lancets lancets Check sugar 2 times daily. DX E11.8 200 each 3   acetaminophen (TYLENOL) 500 MG tablet Take 1,000 mg by mouth every 6 (six) hours as needed for moderate pain.     Albuterol Sulfate (PROAIR RESPICLICK) 1867(90 Base) MCG/ACT AEPB Inhale 1-2 puffs into the lungs every 4 (four) hours as needed. (Patient taking differently: Inhale 1-2 puffs into the lungs every 4 (four) hours as needed (shortness of breath).) 1 each 11   aspirin EC 81 MG tablet Take 81 mg by mouth daily with breakfast.     Blood Glucose Monitoring Suppl (ACCU-CHEK AVIVA PLUS) w/Device KIT Check sugar 2 times daily. DX E11.8 1 kit 12   BREO ELLIPTA 100-25 MCG/INH AEPB INHALE 1 PUFF BY MOUTH EVERY DAY 60 each 2   carbidopa-levodopa (SINEMET IR) 25-100 MG tablet TAKE 1 TABLET THREE TIMES DAILY (AT 7AM, 11AM, 4PM) 270 tablet 1   Cholecalciferol (QC VITAMIN D3) 50 MCG (2000 UT) TABS Take 2,000 Units by mouth daily with lunch.     citalopram (CELEXA) 20 MG tablet TAKE 1 TABLET EVERY DAY 90  tablet 0   cyclobenzaprine (FLEXERIL) 5 MG tablet Take 5 mg by mouth 2 (two) times daily.     divalproex (DEPAKOTE ER) 500 MG 24 hr tablet TAKE 2 TABLETS EVERY DAY 180 tablet 0   donepezil (ARICEPT) 10 MG tablet TAKE 1 TABLET EVERY DAY 90 tablet 0   doxycycline (VIBRA-TABS) 100 MG tablet Take 1 tablet (100 mg total) by mouth 2 (two) times daily. 20 tablet 0   famotidine (PEPCID) 10 MG tablet Take 2 tablets (20 mg total) by mouth daily. 180 tablet 3   gabapentin (NEURONTIN) 400 MG capsule TAKE 3 CAPSULES AT BEDTIME AND MAY REPEAT DOSE ONE TIME IF NEEDED 270 capsule 3   gentamicin cream (GARAMYCIN) 0.1 % Apply 1 Application topically 2 (two) times daily. 30 g 1   glucose blood (ACCU-CHEK AVIVA  PLUS) test strip Check sugar 2 times daily. DX E11.8 200 each 3   glucose blood (ACCU-CHEK GUIDE) test strip Use as instructed 100 each 12   lisinopril (ZESTRIL) 5 MG tablet TAKE 1 TABLET (5 MG TOTAL) BY MOUTH DAILY. 30 tablet 0   LORazepam (ATIVAN) 0.5 MG tablet TAKE 1 TO 2 TABLETS BY MOUTH TWICE A DAY AS NEEDED 45 tablet 0   meloxicam (MOBIC) 15 MG tablet TAKE 1 TABLET EVERY DAY WITH SUPPER 90 tablet 1   memantine (NAMENDA XR) 7 MG CP24 24 hr capsule TAKE 1 CAPSULE (7 MG TOTAL) BY MOUTH EVERY MORNING. 30 capsule 0   metFORMIN (GLUCOPHAGE) 500 MG tablet TAKE 1 TABLET TWICE DAILY WITH A MEAL 180 tablet 0   mirtazapine (REMERON) 45 MG tablet TAKE 1 TABLET AT BEDTIME 90 tablet 0   nitroGLYCERIN (NITROSTAT) 0.4 MG SL tablet Place 0.4 mg under the tongue every 5 (five) minutes x 3 doses as needed for chest pain.     simvastatin (ZOCOR) 40 MG tablet TAKE 1 TABLET EVERY DAY AT 6:00PM 90 tablet 0   SYMPROIC 0.2 MG TABS      tamsulosin (FLOMAX) 0.4 MG CAPS capsule TAKE 1 CAPSULE BY MOUTH EVERY DAY 90 capsule 2   traZODone (DESYREL) 50 MG tablet TAKE 1 TABLET BY MOUTH EVERYDAY AT BEDTIME 30 tablet 0   vitamin C (ASCORBIC ACID) 500 MG tablet Take 500 mg by mouth daily with lunch.     No current facility-administered medications for this visit.    Allergies  Allergen Reactions   Codeine Hives and Itching    Family History  Problem Relation Age of Onset   Multiple myeloma Mother    Diabetes Mother    Diabetes Sister    Lymphoma Sister    Stroke Neg Hx     Social History   Socioeconomic History   Marital status: Married    Spouse name: Vaughan Basta   Number of children: 3   Years of education: 12   Highest education level: Not on file  Occupational History   Occupation: retired    Comment: retired  Tobacco Use   Smoking status: Every Day    Packs/day: 1.00    Years: 40.00    Total pack years: 40.00    Types: Cigarettes   Smokeless tobacco: Never  Vaping Use   Vaping Use: Never used   Substance and Sexual Activity   Alcohol use: No    Alcohol/week: 0.0 standard drinks of alcohol   Drug use: No   Sexual activity: Not on file  Other Topics Concern   Not on file  Social History Narrative   Patient is right  handed   Patient resides with wife,consumes 2-3 cups of caffeine daily   Social Determinants of Health   Financial Resource Strain: Not on file  Food Insecurity: Not on file  Transportation Needs: Not on file  Physical Activity: Not on file  Stress: Not on file  Social Connections: Not on file  Intimate Partner Violence: Not on file     Constitutional: Denies fever, malaise, fatigue, headache or abrupt weight changes.  HEENT: Denies eye pain, eye redness, ear pain, ringing in the ears, wax buildup, runny nose, nasal congestion, bloody nose, or sore throat. Respiratory: Patient reports chronic cough and shortness of breath.  Denies difficulty breathing.   Cardiovascular: Denies chest pain, chest tightness, palpitations or swelling in the hands or feet.  Gastrointestinal: Patient reports intermittent reflux.  Denies abdominal pain, bloating, constipation, diarrhea or blood in the stool.  GU: Patient reports urinary frequency.  Denies urgency, pain with urination, burning sensation, blood in urine, odor or discharge. Musculoskeletal: Patient reports chronic back pain.  Denies decrease in range of motion, difficulty with gait, or joint swelling.  Skin: Denies redness, rashes, lesions or ulcercations.  Neurological: Patient reports difficulty with memory and balance.  Denies dizziness, difficulty with speech or problems with coordination.  Psych: Patient has a history of anxiety.  Denies depression, SI/HI.  No other specific complaints in a complete review of systems (except as listed in HPI above).  Objective:   Physical Exam  There were no vitals taken for this visit. Wt Readings from Last 3 Encounters:  09/20/21 191 lb (86.6 kg)  06/22/21 170 lb 4.8 oz  (77.2 kg)  04/03/21 168 lb 12.8 oz (76.6 kg)    General: Appears their stated age, well developed, well nourished in NAD. Skin: Warm, dry and intact. No rashes, lesions or ulcerations noted. HEENT: Head: normal shape and size; Eyes: sclera white, no icterus, conjunctiva pink, PERRLA and EOMs intact; Ears: Tm's gray and intact, normal light reflex; Nose: mucosa pink and moist, septum midline; Throat/Mouth: Teeth present, mucosa pink and moist, no exudate, lesions or ulcerations noted.  Neck:  Neck supple, trachea midline. No masses, lumps or thyromegaly present.  Cardiovascular: Normal rate and rhythm. S1,S2 noted.  No murmur, rubs or gallops noted. No JVD or BLE edema. No carotid bruits noted. Pulmonary/Chest: Normal effort and positive vesicular breath sounds. No respiratory distress. No wheezes, rales or ronchi noted.  Abdomen: Soft and nontender. Normal bowel sounds. No distention or masses noted. Liver, spleen and kidneys non palpable. Musculoskeletal: Normal range of motion. No signs of joint swelling. No difficulty with gait.  Neurological: Alert and oriented. Cranial nerves II-XII grossly intact. Coordination normal.  Psychiatric: Mood and affect normal. Behavior is normal. Judgment and thought content normal.    BMET    Component Value Date/Time   NA 140 09/20/2021 0905   NA 141 10/11/2014 1122   K 5.6 (H) 09/20/2021 0905   CL 100 09/20/2021 0905   CO2 30 09/20/2021 0905   GLUCOSE 179 (H) 09/20/2021 0905   BUN 18 09/20/2021 0905   BUN 11 10/11/2014 1122   CREATININE 1.13 09/20/2021 0905   CALCIUM 9.9 09/20/2021 0905   GFRNONAA >60 01/23/2021 0938   GFRAA >60 12/25/2019 0358    Lipid Panel     Component Value Date/Time   CHOL 176 09/20/2021 0905   TRIG 275 (H) 09/20/2021 0905   HDL 43 09/20/2021 0905   CHOLHDL 4.1 09/20/2021 0905   VLDL 75.6 (H) 04/27/2019 1116   LDLCALC  94 09/20/2021 0905    CBC    Component Value Date/Time   WBC 6.1 09/20/2021 0905   RBC 4.40  09/20/2021 0905   HGB 14.1 09/20/2021 0905   HCT 42.5 09/20/2021 0905   PLT 172 09/20/2021 0905   MCV 96.6 09/20/2021 0905   MCH 32.0 09/20/2021 0905   MCHC 33.2 09/20/2021 0905   RDW 11.9 09/20/2021 0905   RDW 13.5 10/11/2014 1122   LYMPHSABS 0.9 12/25/2019 0358   LYMPHSABS 1.3 10/11/2014 1122   MONOABS 0.5 12/25/2019 0358   EOSABS 0.0 12/25/2019 0358   EOSABS 0.0 10/11/2014 1122   BASOSABS 0.0 12/25/2019 0358   BASOSABS 0.0 10/11/2014 1122    Hgb A1C Lab Results  Component Value Date   HGBA1C 7.4 (A) 09/20/2021            Assessment & Plan:      RTC in 6 months for your annual exam Webb Silversmith, NP

## 2022-05-23 ENCOUNTER — Other Ambulatory Visit: Payer: Self-pay | Admitting: Internal Medicine

## 2022-05-23 ENCOUNTER — Encounter: Payer: Self-pay | Admitting: Internal Medicine

## 2022-05-23 NOTE — Assessment & Plan Note (Signed)
Unable to obtain labs today Encouraged him to consume a low-carb diet Continue metformin Encourage routine eye exam He follows with podiatry for foot exams Immunizations UTD

## 2022-05-23 NOTE — Assessment & Plan Note (Signed)
Try to avoid foods that trigger reflux Continue famotidine

## 2022-05-23 NOTE — Telephone Encounter (Signed)
Requested Prescriptions  Pending Prescriptions Disp Refills   meloxicam (MOBIC) 15 MG tablet [Pharmacy Med Name: MELOXICAM 15 MG Tablet] 90 tablet 1    Sig: TAKE 1 TABLET EVERY DAY WITH SUPPER     Analgesics:  COX2 Inhibitors Failed - 05/23/2022  3:09 AM      Failed - Manual Review: Labs are only required if the patient has taken medication for more than 8 weeks.      Failed - ALT in normal range and within 360 days    ALT  Date Value Ref Range Status  09/20/2021 3 (L) 9 - 46 U/L Final         Passed - HGB in normal range and within 360 days    Hemoglobin  Date Value Ref Range Status  09/20/2021 14.1 13.2 - 17.1 g/dL Final         Passed - Cr in normal range and within 360 days    Creat  Date Value Ref Range Status  09/20/2021 1.13 0.70 - 1.28 mg/dL Final   Creatinine,U  Date Value Ref Range Status  03/20/2017 115.3 mg/dL Final   Creatinine, Urine  Date Value Ref Range Status  09/20/2021 209 20 - 320 mg/dL Final         Passed - HCT in normal range and within 360 days    HCT  Date Value Ref Range Status  09/20/2021 42.5 38.5 - 50.0 % Final         Passed - AST in normal range and within 360 days    AST  Date Value Ref Range Status  09/20/2021 11 10 - 35 U/L Final         Passed - eGFR is 30 or above and within 360 days    GFR calc Af Amer  Date Value Ref Range Status  12/25/2019 >60 >60 mL/min Final   GFR, Estimated  Date Value Ref Range Status  01/23/2021 >60 >60 mL/min Final    Comment:    (NOTE) Calculated using the CKD-EPI Creatinine Equation (2021)    GFR  Date Value Ref Range Status  01/06/2020 59.40 (L) >60.00 mL/min Final   eGFR  Date Value Ref Range Status  09/20/2021 67 > OR = 60 mL/min/1.18m Final    Comment:    The eGFR is based on the CKD-EPI 2021 equation. To calculate  the new eGFR from a previous Creatinine or Cystatin C result, go to https://www.kidney.org/professionals/ kdoqi/gfr%5Fcalculator          Passed - Patient is not  pregnant      Passed - Valid encounter within last 12 months    Recent Outpatient Visits           8 months ago Encounter for general adult medical examination with abnormal findings   [place]   11 months ago [facility]ortic atherosclerosis [facility]   1 year ago Sacral pain   [facility] [place] [place]

## 2022-05-23 NOTE — Assessment & Plan Note (Signed)
Controlled on lisinopril Reinforced DASH diet and exercise for weight loss

## 2022-05-23 NOTE — Assessment & Plan Note (Signed)
Continue Percocet, cyclobenzaprine, meloxicam and gabapentin He will continue to follow with pain management

## 2022-05-23 NOTE — Assessment & Plan Note (Signed)
Encouraged him to consume a low-fat diet Continue simvastatin

## 2022-05-23 NOTE — Assessment & Plan Note (Signed)
Encourage smoking cessation Continue Breo and albuterol as needed Continue oxygen as needed

## 2022-05-23 NOTE — Assessment & Plan Note (Signed)
Continue Depakote, donepezil, memantine, citalopram, lorazepam, trazodone and mirtazapine.

## 2022-05-23 NOTE — Assessment & Plan Note (Signed)
-   Continue Flomax 

## 2022-05-23 NOTE — Assessment & Plan Note (Signed)
Continue Sinemet ?

## 2022-05-23 NOTE — Patient Instructions (Signed)

## 2022-05-23 NOTE — Assessment & Plan Note (Addendum)
Encourage low-fat diet Continue simvastatin and aspirin

## 2022-05-23 NOTE — Assessment & Plan Note (Signed)
Continue trazodone 

## 2022-05-30 ENCOUNTER — Other Ambulatory Visit: Payer: Self-pay | Admitting: Internal Medicine

## 2022-05-30 ENCOUNTER — Other Ambulatory Visit: Payer: Self-pay | Admitting: Neurology

## 2022-05-30 DIAGNOSIS — F03918 Unspecified dementia, unspecified severity, with other behavioral disturbance: Secondary | ICD-10-CM

## 2022-05-30 DIAGNOSIS — G4700 Insomnia, unspecified: Secondary | ICD-10-CM

## 2022-05-30 DIAGNOSIS — G20A1 Parkinson's disease without dyskinesia, without mention of fluctuations: Secondary | ICD-10-CM

## 2022-05-30 NOTE — Telephone Encounter (Signed)
Requested Prescriptions  Pending Prescriptions Disp Refills   lisinopril (ZESTRIL) 5 MG tablet [Pharmacy Med Name: LISINOPRIL 5 MG TABLET] 90 tablet 1    Sig: TAKE 1 TABLET (5 MG TOTAL) BY MOUTH DAILY.     Cardiovascular:  ACE Inhibitors Failed - 05/30/2022  4:02 PM      Failed - Cr in normal range and within 180 days    Creat  Date Value Ref Range Status  09/20/2021 1.13 0.70 - 1.28 mg/dL Final   Creatinine,U  Date Value Ref Range Status  03/20/2017 115.3 mg/dL Final   Creatinine, Urine  Date Value Ref Range Status  09/20/2021 209 20 - 320 mg/dL Final         Failed - K in normal range and within 180 days    Potassium  Date Value Ref Range Status  09/20/2021 5.6 (H) 3.5 - 5.3 mmol/L Final         Passed - Patient is not pregnant      Passed - Last BP in normal range    BP Readings from Last 1 Encounters:  05/23/22 134/72         Passed - Valid encounter within last 6 months    Recent Outpatient Visits           8 months ago Encounter for general adult medical examination with abnormal findings   [facility] [place], Coralie Keens, NP   11 months ago Aortic atherosclerosis [facility])   [facility] [place], Coralie Keens, NP   1 year ago Sacral pain   [facility] [place], [place] W, NP               memantine (NAMENDA XR) 7 MG CP24 24 hr capsule [Pharmacy Med Name: MEMANTINE HCL ER 7 MG CAPSULE] 90 capsule 1    Sig: TAKE 1 CAPSULE (7 MG TOTAL) BY MOUTH EVERY MORNING.     Neurology:  Alzheimer's Agents 2 Passed - 05/30/2022  4:02 PM      Passed - Cr in normal range and within 360 days    Creat  Date Value Ref Range Status  09/20/2021 1.13 0.70 - 1.28 mg/dL Final   Creatinine,U  Date Value Ref Range Status  03/20/2017 115.3 mg/dL Final   Creatinine, Urine  Date Value Ref Range Status  09/20/2021 209 20 - 320 mg/dL Final         Passed - eGFR is 5 or above and within 360 days    GFR calc Af Amer  Date Value Ref Range Status   12/25/2019 >60 >60 mL/min Final   GFR, Estimated  Date Value Ref Range Status  01/23/2021 >60 >60 mL/min Final    Comment:    (NOTE) Calculated using the CKD-EPI Creatinine Equation (2021)    GFR  Date Value Ref Range Status  01/06/2020 59.40 (L) >60.00 mL/min Final   eGFR  Date Value Ref Range Status  09/20/2021 67 > OR = 60 mL/min/1.41m Final    Comment:    The eGFR is based on the CKD-EPI 2021 equation. To calculate  the new eGFR from a previous Creatinine or Cystatin C result, go to https://www.kidney.org/professionals/ kdoqi/gfr%5Fcalculator          Passed - Valid encounter within last 6 months    Recent Outpatient Visits           8 months ago En[facility] for general adult medical examination with abnormal findings   [facility]   11 months ago Aortic atherosclerosis [facility])   [facility] [place], Coralie Keens, NP   1 year ago Sacral pain   [facility] [place], Coralie Keens, [place]

## 2022-06-06 ENCOUNTER — Encounter: Payer: Self-pay | Admitting: Internal Medicine

## 2022-06-06 MED ORDER — TRAZODONE HCL 50 MG PO TABS: 50.0000 mg | ORAL_TABLET | Freq: Every day | ORAL | 1 refills | Status: DC

## 2022-06-10 DIAGNOSIS — Z6825 Body mass index (BMI) 25.0-25.9, adult: Secondary | ICD-10-CM | POA: Diagnosis not present

## 2022-06-10 DIAGNOSIS — M5416 Radiculopathy, lumbar region: Secondary | ICD-10-CM | POA: Diagnosis not present

## 2022-06-11 ENCOUNTER — Other Ambulatory Visit: Payer: Self-pay | Admitting: Internal Medicine

## 2022-06-11 NOTE — Telephone Encounter (Signed)
Requested medications are due for refill today.  yes  Requested medications are on the active medications list.  yes  Last refill. 06/06/2022   Future visit scheduled.   no  Notes to clinic.  Pt needs appt. Refill not delegated.    Requested Prescriptions  Pending Prescriptions Disp Refills   traZODone (DESYREL) 50 MG tablet [Pharmacy Med Name: TRAZODONE 50 MG TABLET] 30 tablet     Sig: TAKE 1 TABLET BY MOUTH EVERYDAY AT BEDTIME     Psychiatry: Antidepressants - Serotonin Modulator Failed - 06/11/2022  4:01 PM      Failed - Valid encounter within last 6 months    Recent Outpatient Visits           8 months ago Encounter for general adult medical examination with abnormal findings   [facility] [place], Coralie Keens, NP   11 months ago Aortic atherosclerosis [facility])   [facility], Coralie Keens, NP   1 year ago Sacral pain   [facility] [place], [place] W, NP               LORazepam (ATIVAN) 0.5 MG tablet [Pharmacy Med Name: LORAZEPAM 0.5 MG TABLET] 45 tablet 0    Sig: TAKE 1 TO 2 TABLETS BY MOUTH TWICE A DAY AS NEEDED     Not Delegated - Psychiatry: Anxiolytics/Hypnotics 2 Failed - 06/11/2022  4:01 PM      Failed - This refill cannot be delegated      Failed - Urine Drug Screen completed in last 360 days      Failed - Valid encounter within last 6 months    Recent Outpatient Visits           8 months ago Encounter for general adult medical examination with abnormal findings   [facility] [place], Coralie Keens, NP   11 months ago Aortic atherosclerosis [facility])   [facility] [place], Coralie Keens, NP   1 year ago Sacral pain   [place], [place]              Passed - Patient is not pregnant

## 2022-06-19 ENCOUNTER — Ambulatory Visit: Payer: Medicare HMO | Admitting: Podiatry

## 2022-07-06 ENCOUNTER — Ambulatory Visit: Payer: Medicare HMO | Admitting: Podiatry

## 2022-07-08 ENCOUNTER — Telehealth: Payer: Self-pay | Admitting: Internal Medicine

## 2022-07-08 NOTE — Telephone Encounter (Signed)
Angel from [employer] called to report that a prescription for Oxygen was faxed on 07/05/2022.  Needs signature from PCP and to update the liters per minute information.   Best contact: [phone]

## 2022-07-09 NOTE — Telephone Encounter (Signed)
Does pulmonology prescribe this?  Thanks,   -Mickel Baas

## 2022-07-10 NOTE — Telephone Encounter (Signed)
I filled this out and it should have been faxed back Monday or Tuesday this week.

## 2022-07-12 ENCOUNTER — Other Ambulatory Visit: Payer: Self-pay | Admitting: Neurology

## 2022-07-12 DIAGNOSIS — G20A1 Parkinson's disease without dyskinesia, without mention of fluctuations: Secondary | ICD-10-CM

## 2022-07-18 ENCOUNTER — Other Ambulatory Visit: Payer: Self-pay | Admitting: Internal Medicine

## 2022-07-19 NOTE — Telephone Encounter (Signed)
Requested medication (s) are due for refill today: Yes  Requested medication (s) are on the active medication list: Yes  Last refill:  06/12/22  Future visit scheduled: No  Notes to clinic:  Unable to refill per protocol, cannot delegate.      Requested Prescriptions  Pending Prescriptions Disp Refills   LORazepam (ATIVAN) 0.5 MG tablet [Pharmacy Med Name: LORAZEPAM 0.5 MG TABLET] 45 tablet 0    Sig: TAKE 1 TO 2 TABLETS BY MOUTH TWICE A DAY AS NEEDED     Not Delegated - Psychiatry: Anxiolytics/Hypnotics 2 Failed - 07/18/2022  9:37 AM      Failed - This refill cannot be delegated      Failed - Urine Drug Screen completed in last 360 days      Failed - Valid encounter within last 6 months    Recent Outpatient Visits           10 months ago Encounter for general adult medical examination with abnormal findings   [facility] [place], Coralie Keens, NP   1 year ago Aortic atherosclerosis [facility])   [facility] [place], Coralie Keens, NP   1 year ago Sacral pain   [place], [place]              Passed - Patient is not pregnant

## 2022-07-25 DIAGNOSIS — G20A1 Parkinson's disease without dyskinesia, without mention of fluctuations: Secondary | ICD-10-CM | POA: Diagnosis not present

## 2022-07-25 DIAGNOSIS — M546 Pain in thoracic spine: Secondary | ICD-10-CM | POA: Diagnosis not present

## 2022-07-25 DIAGNOSIS — M5416 Radiculopathy, lumbar region: Secondary | ICD-10-CM | POA: Diagnosis not present

## 2022-07-25 DIAGNOSIS — F1120 Opioid dependence, uncomplicated: Secondary | ICD-10-CM | POA: Diagnosis not present

## 2022-08-09 ENCOUNTER — Ambulatory Visit: Payer: Medicare HMO | Admitting: *Deleted

## 2022-08-09 ENCOUNTER — Encounter: Payer: Self-pay | Admitting: Internal Medicine

## 2022-08-09 ENCOUNTER — Ambulatory Visit: Payer: Medicare HMO | Admitting: Podiatry

## 2022-08-09 VITALS — BP 130/63 | HR 80

## 2022-08-09 DIAGNOSIS — M2141 Flat foot [pes planus] (acquired), right foot: Secondary | ICD-10-CM

## 2022-08-09 DIAGNOSIS — E1142 Type 2 diabetes mellitus with diabetic polyneuropathy: Secondary | ICD-10-CM | POA: Diagnosis not present

## 2022-08-09 DIAGNOSIS — E0843 Diabetes mellitus due to underlying condition with diabetic autonomic (poly)neuropathy: Secondary | ICD-10-CM

## 2022-08-09 DIAGNOSIS — L97522 Non-pressure chronic ulcer of other part of left foot with fat layer exposed: Secondary | ICD-10-CM

## 2022-08-09 NOTE — Progress Notes (Signed)
Chief Complaint  Patient presents with   Diabetic Ulcer    Diabetic ulcer left foot hallux, A1c- BG- Not taking,     Subjective:  77 y.o. male with PMHx of diabetes mellitus presenting today for follow-up evaluation of an ulcer to the right great toe.  Patient states that the wound has now callused and it is becoming symptomatic.  He mostly wears house slippers throughout the day.  No change in past medical history since last visit  Past Medical History:  Diagnosis Date   Arthritis    COPD (chronic obstructive pulmonary disease) (HCC)    Depression    Dyspnea    Erectile dysfunction    He's had a hx of    Hypercholesterolemia    Hypertension    Essential   Lumbosacral disc disease    with chronic low back pain, has implanted nerve stimulator   Peripheral neuropathy    Presenile dementia    Type 2 diabetes mellitus ([place]) 2010   takes oral meds   Past Surgical History:  Procedure Laterality Date   ANTERIOR CERVICAL DECOMP/DISCECTOMY FUSION     APPENDECTOMY     HEMORRHOID SURGERY     x2   JOINT REPLACEMENT Left    2nd one was to change siZe   KNEE SURGERY     Previous left total replacement   KYPHOPLASTY N/A 12/10/2012   Procedure: Lumbar one Kyphoplasty;  Surgeon: Kristeen Miss, MD;  Location: [facility];  Service: Neurosurgery;  Laterality: N/A;  Lumbar One Kyphoplasty   LUMBAR LAMINECTOMY/DECOMPRESSION MICRODISCECTOMY Bilateral 01/25/2021   Procedure: Bilateral Lumbar 4-5, Lumbar 5 Sacral 1 Laminectomy/Foraminotomy;  Surgeon: Erline Levine, MD;  Location: [place];  Service: Neurosurgery;  Laterality: Bilateral;  3C/RM 21   OTHER SURGICAL HISTORY  1992   Bone Fusion in Right Foot   SPINAL CORD STIMULATOR INSERTION N/A 12/02/2014   Procedure: THORACIC SPINAL CORD STIMULATOR INSERTION INFINION BOSTON SCIENTIFIC 16 LEAD WITH LAMINECTOMY;  Surgeon: Erline Levine, MD;  Location: [place] [facility];  Service: Neurosurgery;  Laterality: N/A;  THORACIC SPINAL CORD STIMULATOR INSERTION  INFINION BOSTON SCIENTIFIC 16 LEAD WITH LAMINECTOMY   SPINAL CORD STIMULATOR REMOVAL N/A 10/19/2020   Procedure: Removal of spinal cord stimulator with paddle lead;  Surgeon: Erline Levine, MD;  Location: [place];  Service: Neurosurgery;  Laterality: N/A;   TOTAL KNEE ARTHROPLASTY Left    Allergies  Allergen Reactions   Codeine Hives and Itching    Objective/Physical Exam General: The patient is alert and oriented x3 in no acute distress.  Dermatology:  The ulcer to the plantar aspect of the left great toe has healed.  There is a preulcerative callus noted with hyperkeratosis of the skin.  After debridement there is no open wound.  Vascular: Pulses are palpable.  Skin is warm to touch.  Clinically there is no concern for vascular compromise  VAS [place] ABI WITH/WO TBI 09/14/2021 Summary:  Right: Resting right ankle-brachial index is within normal range. No  evidence of significant right lower extremity arterial disease. The right  toe-brachial index is normal.   Left: Resting left ankle-brachial index is within normal range. No  evidence of significant left lower extremity arterial disease. The left  toe-brachial index is normal.   Neurological: Epicritic and protective threshold diminished bilaterally.   Musculoskeletal Exam: Pes planovalgus deformity noted  Radiographic exam LT foot 09/04/2021: Diffuse degenerative changes noted throughout the foot.  No fractures identified.  Normal osseous mineralization.  Absence of the fifth metatarsal head  noted from prior surgery.  Assessment: 1.  Ulcer left great toe secondary to diabetes mellitus; healed 2. diabetes mellitus w/ peripheral neuropathy 3.  Preulcerative callus lesion left great toe  Plan of Care:  1. Patient was evaluated.   2.  The ulcer to the plantar aspect of the left great toe has healed completely.  Complete reepithelialization has occurred.  There is a preulcerative callus however.  This was debrided using a #312 scalpel  without incident or bleeding.  Patient felt relief 3.  Continue wearing diabetic shoes and insoles with felt offloading dancers pads to alleviate pressure from the great toe 4.  Return to clinic 3 months  Edrick Kins, DPM [facility]  Dr. Edrick Kins, DPM    [address], [place] [address]                Office [phone]  Fax [phone]

## 2022-08-09 NOTE — Progress Notes (Signed)
Patient presents to the office today for diabetic shoe and insole measuring.  Patient was measured with brannock device to determine size and width for 1 pair of extra depth shoes and foam casted for 3 pair of insoles.   Documentation of medical necessity will be sent to patient's treating diabetic doctor to verify and sign.   Patient's diabetic provider: Dr. Chancy Hurter  Shoes and insoles will be ordered at that time and patient will be notified for an appointment for fitting when they arrive.    Patient shoe selection-   Shoe choice:   Orthofeet 675  Shoe size ordered: Men's 11.5 XW

## 2022-08-12 NOTE — Telephone Encounter (Signed)
Can we perform prior Auth on patient's Ativan.

## 2022-08-12 NOTE — Progress Notes (Signed)
I am the supervising physician for Webb Silversmith, FNP, and have been involved with their office visit and foot examination. I am certifying that I agree with the content of this note as supervising physician, and have reviewed this encounter including the documentation in this note and/or discussed this patient with the provider, Webb Silversmith, [place]   I am signing this document as the certifying physician who is managing this patient's diabetes under a comprehensive plan of care and due to the patient's current diabetes conditions qualifies for diabetic shoes as part of their treatment plan.  Nobie Putnam, [facility] 08/12/2022, 4:25 PM

## 2022-08-21 ENCOUNTER — Ambulatory Visit (INDEPENDENT_AMBULATORY_CARE_PROVIDER_SITE_OTHER): Payer: Medicare HMO | Admitting: Internal Medicine

## 2022-08-21 ENCOUNTER — Encounter: Payer: Self-pay | Admitting: Internal Medicine

## 2022-08-21 VITALS — BP 156/78 | HR 84 | Temp 96.8°F | Wt 178.0 lb

## 2022-08-21 DIAGNOSIS — F03918 Unspecified dementia, unspecified severity, with other behavioral disturbance: Secondary | ICD-10-CM | POA: Diagnosis not present

## 2022-08-21 DIAGNOSIS — R5383 Other fatigue: Secondary | ICD-10-CM | POA: Diagnosis not present

## 2022-08-21 DIAGNOSIS — R443 Hallucinations, unspecified: Secondary | ICD-10-CM

## 2022-08-21 DIAGNOSIS — R42 Dizziness and giddiness: Secondary | ICD-10-CM | POA: Diagnosis not present

## 2022-08-21 DIAGNOSIS — R634 Abnormal weight loss: Secondary | ICD-10-CM | POA: Diagnosis not present

## 2022-08-21 DIAGNOSIS — E118 Type 2 diabetes mellitus with unspecified complications: Secondary | ICD-10-CM

## 2022-08-21 DIAGNOSIS — F0282 Dementia in other diseases classified elsewhere, unspecified severity, with psychotic disturbance: Secondary | ICD-10-CM | POA: Diagnosis not present

## 2022-08-21 DIAGNOSIS — E1142 Type 2 diabetes mellitus with diabetic polyneuropathy: Secondary | ICD-10-CM | POA: Diagnosis not present

## 2022-08-21 LAB — POCT URINALYSIS DIPSTICK
Bilirubin, UA: NEGATIVE
Blood, UA: NEGATIVE
Glucose, UA: NEGATIVE
Ketones, UA: NEGATIVE
Leukocytes, UA: NEGATIVE
Nitrite, UA: NEGATIVE
Protein, UA: NEGATIVE
Spec Grav, UA: <=1.005 — AB (ref 1.010–1.025)
Urobilinogen, UA: 0.2 E.U./dL
pH, UA: 8 (ref 5.0–8.0)

## 2022-08-21 MED ORDER — MEMANTINE HCL ER 14 MG PO CP24: 14.0000 mg | ORAL_CAPSULE | Freq: Every day | ORAL | 0 refills | Status: DC

## 2022-08-21 NOTE — Progress Notes (Signed)
Subjective:    Patient ID: Alexander Booth, male    DOB: 1945-08-28, 77 y.o.   MRN: [id number]  HPI  Patient presents to clinic today with complaint of dizziness.  He reports this can occur anytime of the day.  It typically occurs with changing positions.  He reports he feels like he is going to pass out.  He reports he is not sleeping well at night despite trazodone.  His appetite is poor and he has lost 15 pounds in the last 2 months.  He has not been coughing but is short of breath.  He wears his oxygen at home but not when he leaves the house.  He has chronic pain and has difficulty getting around.  He has started to hear a dog barking inside and outside of the house.  He does not have a dog at home.  He is taking Donepezil, Memantine and Depakote as prescribed.  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  Review of Systems     Past Medical History:  Diagnosis Date   Arthritis    COPD (chronic obstructive pulmonary disease) ([place])    Depression    Dyspnea    Erectile dysfunction    He's had a hx of    Hypercholesterolemia    Hypertension    Essential   Lumbosacral disc disease    with chronic low back pain, has implanted nerve stimulator   Peripheral neuropathy    Presenile dementia    Type 2 diabetes mellitus ([place]) 2010   takes oral meds    Current Outpatient Medications  Medication Sig Dispense Refill   Accu-Chek Softclix Lancets lancets Check sugar 2 times daily. DX E11.8 200 each 3   acetaminophen (TYLENOL) 500 MG tablet Take 1,000 mg by mouth every 6 (six) hours as needed for moderate pain.     Albuterol Sulfate (PROAIR RESPICLICK) 132 (90 Base) MCG/ACT AEPB Inhale 1-2 puffs into the lungs every 4 (four) hours as needed. (Patient taking differently: Inhale 1-2 puffs into the lungs every 4 (four) hours as needed (shortness of breath).) 1 each 11   aspirin EC 81 MG tablet Take 81 mg by mouth daily with breakfast.     Blood Glucose Monitoring Suppl (ACCU-CHEK AVIVA  PLUS) w/Device KIT Check sugar 2 times daily. DX E11.8 1 kit 12   BREO ELLIPTA 100-25 MCG/INH AEPB INHALE 1 PUFF BY MOUTH EVERY DAY 60 each 2   carbidopa-levodopa (SINEMET IR) 25-100 MG tablet TAKE 1 TABLET THREE TIMES DAILY (AT 7AM, 11AM, 4PM) 270 tablet 1   Cholecalciferol (QC VITAMIN D3) 50 MCG (2000 UT) TABS Take 2,000 Units by mouth daily with lunch.     citalopram (CELEXA) 20 MG tablet TAKE 1 TABLET EVERY DAY 90 tablet 0   cyclobenzaprine (FLEXERIL) 5 MG tablet Take 5 mg by mouth 2 (two) times daily.     divalproex (DEPAKOTE ER) 500 MG 24 hr tablet TAKE 2 TABLETS EVERY DAY 180 tablet 0   donepezil (ARICEPT) 10 MG tablet TAKE 1 TABLET EVERY DAY 90 tablet 0   doxycycline (VIBRA-TABS) 100 MG tablet Take 1 tablet (100 mg total) by mouth 2 (two) times daily. (Patient not taking: Reported on 05/23/2022) 20 tablet 0   famotidine (PEPCID) 10 MG tablet Take 2 tablets (20 mg total) by mouth daily. 180 tablet 3   gabapentin (NEURONTIN) 400 MG capsule TAKE 3 CAPSULES AT BEDTIME AND MAY REPEAT DOSE ONE TIME IF NEEDED 270 capsule 3   gentamicin cream (  GARAMYCIN) 0.1 % Apply 1 Application topically 2 (two) times daily. 30 g 1   glucose blood (ACCU-CHEK AVIVA PLUS) test strip Check sugar 2 times daily. DX E11.8 200 each 3   glucose blood (ACCU-CHEK GUIDE) test strip Use as instructed (Patient not taking: Reported on 05/23/2022) 100 each 12   lisinopril (ZESTRIL) 5 MG tablet TAKE 1 TABLET (5 MG TOTAL) BY MOUTH DAILY. 90 tablet 1   LORazepam (ATIVAN) 0.5 MG tablet TAKE 1 TO 2 TABLETS BY MOUTH TWICE A DAY AS NEEDED 45 tablet 0   meloxicam (MOBIC) 15 MG tablet TAKE 1 TABLET EVERY DAY WITH SUPPER 90 tablet 1   memantine (NAMENDA XR) 7 MG CP24 24 hr capsule TAKE 1 CAPSULE (7 MG TOTAL) BY MOUTH EVERY MORNING. 90 capsule 1   metFORMIN (GLUCOPHAGE) 500 MG tablet TAKE 1 TABLET TWICE DAILY WITH A MEAL 180 tablet 0   mirtazapine (REMERON) 45 MG tablet TAKE 1 TABLET AT BEDTIME 90 tablet 0   nitroGLYCERIN (NITROSTAT) 0.4  MG SL tablet Place 0.4 mg under the tongue every 5 (five) minutes x 3 doses as needed for chest pain. (Patient not taking: Reported on 05/23/2022)     simvastatin (ZOCOR) 40 MG tablet TAKE 1 TABLET EVERY DAY AT 6:00PM 90 tablet 0   SYMPROIC 0.2 MG TABS      tamsulosin (FLOMAX) 0.4 MG CAPS capsule TAKE 1 CAPSULE BY MOUTH EVERY DAY 90 capsule 2   traZODone (DESYREL) 50 MG tablet Take 1-2 tablets (50-100 mg total) by mouth at bedtime. 180 tablet 1   vitamin C (ASCORBIC ACID) 500 MG tablet Take 500 mg by mouth daily with lunch.     No current facility-administered medications for this visit.    Allergies  Allergen Reactions   Codeine Hives and Itching    Family History  Problem Relation Age of Onset   Multiple myeloma Mother    Diabetes Mother    Diabetes Sister    Lymphoma Sister    Stroke Neg Hx     Social History   Socioeconomic History   Marital status: Married    Spouse name: Vaughan Basta   Number of children: 3   Years of education: 12   Highest education level: Not on file  Occupational History   Occupation: retired    Comment: retired  Tobacco Use   Smoking status: Every Day    Packs/day: 1.00    Years: 40.00    Total pack years: 40.00    Types: Cigarettes   Smokeless tobacco: Never  Vaping Use   Vaping Use: Never used  Substance and Sexual Activity   Alcohol use: No    Alcohol/week: 0.0 standard drinks of alcohol   Drug use: No   Sexual activity: Not on file  Other Topics Concern   Not on file  Social History Narrative   Patient is right handed   Patient resides with wife,consumes 2-3 cups of caffeine daily   Social Determinants of Health   Financial Resource Strain: Not on file  Food Insecurity: Not on file  Transportation Needs: Not on file  Physical Activity: Not on file  Stress: Not on file  Social Connections: Not on file  Intimate Partner Violence: Not on file     Constitutional: Patient reports fatigue, unintentional weight loss.  Denies fever,  malaise, headache or abrupt weight changes.  HEENT: Denies eye pain, eye redness, ear pain, ringing in the ears, wax buildup, runny nose, nasal congestion, bloody nose, or sore throat. Respiratory:  Patient reports shortness of breath.  Denies difficulty breathing, cough or sputum production.   Cardiovascular: Denies chest pain, chest tightness, palpitations or swelling in the hands or feet.  Gastrointestinal: Patient reports constipation.  Denies abdominal pain, bloating,  diarrhea or blood in the stool.  GU: Denies urgency, frequency, pain with urination, burning sensation, blood in urine, odor or discharge. Musculoskeletal: Patient reports chronic joint pain.  Denies decrease in range of motion, difficulty with gait, muscle pain or joint swelling.  Skin: Denies redness, rashes, lesions or ulcercations.  Neurological: Patient reports dizziness, difficulty with memory, problems with balance and coordination and hallucinations.   Psych: Patient has a history of anxiety and depression.  Denies SI/HI.  No other specific complaints in a complete review of systems (except as listed in HPI above).  Objective:   Physical Exam   BP (!) 156/78 (BP Location: Right Arm, Patient Position: Sitting, Cuff Size: Normal)   Pulse 84   Temp (!) 96.8 F (36 C) (Temporal)   Wt 178 lb (80.7 kg)   SpO2 96%   BMI 24.14 kg/m   Wt Readings from Last 3 Encounters:  05/23/22 192 lb (87.1 kg)  09/20/21 191 lb (86.6 kg)  06/22/21 170 lb 4.8 oz (77.2 kg)    General: Appears his stated age, [place] ill-appearing, in NAD. Skin: Warm, dry and intact.  HEENT: Head: normal shape and size; Eyes: sclera white, no icterus, conjunctiva pink, PERRLA and EOMs intact;  Neck:  Neck supple, trachea midline. No masses, lumps or thyromegaly present.  Cardiovascular: Normal rate and rhythm. S1,S2 noted.  No murmur, rubs or gallops noted. No JVD or BLE edema. No carotid bruits noted. Pulmonary/Chest: Normal effort and positive  vesicular breath sounds. No respiratory distress. No wheezes, rales or ronchi noted.  Abdomen: Soft and tender in the left lower quadrant. Normal bowel sounds.  Musculoskeletal: Shuffling gait. Neurological: Alert and oriented.  Psychiatric: Mood and affect flat. Behavior is normal. Judgment and thought content normal.    BMET    Component Value Date/Time   NA 140 09/20/2021 0905   NA 141 10/11/2014 1122   K 5.6 (H) 09/20/2021 0905   CL 100 09/20/2021 0905   CO2 30 09/20/2021 0905   GLUCOSE 179 (H) 09/20/2021 0905   BUN 18 09/20/2021 0905   BUN 11 10/11/2014 1122   CREATININE 1.13 09/20/2021 0905   CALCIUM 9.9 09/20/2021 0905   GFRNONAA >60 01/23/2021 0938   GFRAA >60 12/25/2019 0358    Lipid Panel     Component Value Date/Time   CHOL 176 09/20/2021 0905   TRIG 275 (H) 09/20/2021 0905   HDL 43 09/20/2021 0905   CHOLHDL 4.1 09/20/2021 0905   VLDL 75.6 (H) 04/27/2019 1116   LDLCALC 94 09/20/2021 0905    CBC    Component Value Date/Time   WBC 6.1 09/20/2021 0905   RBC 4.40 09/20/2021 0905   HGB 14.1 09/20/2021 0905   HCT 42.5 09/20/2021 0905   PLT 172 09/20/2021 0905   MCV 96.6 09/20/2021 0905   MCH 32.0 09/20/2021 0905   MCHC 33.2 09/20/2021 0905   RDW 11.9 09/20/2021 0905   RDW 13.5 10/11/2014 1122   LYMPHSABS 0.9 12/25/2019 0358   LYMPHSABS 1.3 10/11/2014 1122   MONOABS 0.5 12/25/2019 0358   EOSABS 0.0 12/25/2019 0358   EOSABS 0.0 10/11/2014 1122   BASOSABS 0.0 12/25/2019 0358   BASOSABS 0.0 10/11/2014 1122    Hgb A1C Lab Results  Component Value Date   HGBA1C  7.4 (A) 09/20/2021           Assessment & Plan:   Fatigue, Unintentional Weight Loss, Shortness of Breath, Dizziness and Hallucinations:  Will check CBC, c-Met, TSH, lipid, A1c and urinalysis Orthostatics negative Encourage adequate water intake Encouraged him to make position changes slowly Will increase memantine to 14 mg daily Encouraged him to monitor BP at home, if persistently  elevated greater than 140/90 let me know Continue donepezil, Depakote, trazodone and mirtazapine  RTC in 3 months for annual exam Webb Silversmith, NP

## 2022-08-21 NOTE — Patient Instructions (Signed)
Psychosis Psychosis, also called thought disturbance, refers to a severe loss of contact with reality. People having a psychotic episode (acute episode of psychosis) are not able to think clearly, and their emotions and responses do not match with what is actually happening. They may be very upset, have chaotic behavior, or be very quiet and withdrawn. People having a psychotic episode may have false beliefs about what is happening or who they are (delusions). They may see, hear, taste, smell, or feel things that are not present (hallucinations). What are the causes? This condition may be caused by: Very serious mental health or psychiatric conditions, such as schizophrenia, bipolar disorder, or major depression. Use of drugs such as hallucinogens or alcohol. Medical conditions such as delirium or neurological disorders. What are the signs or symptoms? Symptoms of this condition include: Delusions, such as: Feeling a lot of fear or suspicion (paranoia). Believing something that is odd, unrealistic, or false, such as believing that you are someone else. Hallucinations, such as: Hearing or seeing things, smelling odors, experiencing tastes, or feeling bodily sensations that do not exist. Command hallucinations that direct you to do something that could be dangerous. Disorganized thinking, such as thoughts that jump from one idea to another in a way that does not make sense. Disorganized speech, such as saying things that do not make sense, echoing others, or using words based on their sounds rather than their meanings. Inappropriate behavior, such as talking to yourself, showing a clear increase or decrease in activity, or intruding on unfamiliar people. How is this diagnosed? This condition is diagnosed based on an assessment by a health care provider. The health care provider may ask questions about: Your thoughts, feelings, and behavior. Any medical conditions you have. Any use of alcohol or  drugs. One or more of the following may also be done: A physical exam. Blood tests. Brain imaging, such as a CT scan or MRI. A brain wave study (electroencephalogram, or EEG). The health care provider may refer you to a mental health professional for further tests. How is this treated? Treatment for this condition may depend on the cause of the psychosis. Treatment may include one or more of the following: Supportive care and monitoring in the emergency room or hospital. You may need to stay in the hospital if you are a danger to yourself or others. Taking antipsychotic medicines to reduce symptoms and to balance chemicals in the brain. Treating an underlying medical condition. Stopping or reducing drugs that are causing psychotic episodes. Therapy and other supportive programs, such as: Ongoing treatment and care from a mental health professional. Individual or family therapy. Training to learn new skills to cope with the psychosis and prevent further episodes. Follow these instructions at home: Take over-the-counter and prescription medicines only as told by your health care provider. Consult a health care provider before taking over-the-counter medicines, herbs, or supplements. Surround yourself with people who care about you and can help manage your condition. Keep stress under control. Stress may trigger psychosis and make symptoms worse. Maintain a healthy lifestyle. This includes: Eating a healthy diet. Getting enough sleep. Exercising regularly. Avoiding alcohol, nicotine, and recreational drugs. Keep all follow-up visits. This is important. Contact a health care provider if: Medicines do not seem to be helping. You or others notice that: You continue to see, smell, or feel things that are not there. You hear voices telling you to do things. You feel extremely fearful and suspicious that someone or something will harm you. You  feel unable to leave your house. You have  trouble taking care of yourself. You have side effects of medicines, such as: Changes in sleep patterns. Dizziness. Weight gain. Restlessness. Movement changes. Shaking that you cannot control (tremors). Get help right away if: You have serious side effects of medicine, such as: Swelling of the face, lips, tongue, or throat. Fever, confusion, muscle spasms, or seizures. You have serious thoughts about harming yourself or hurting others. Get help right away if you feel like you may hurt yourself or others, or have thoughts about taking your own life. Go to your nearest emergency room or: Call 911. Call the [facility] at [phone] or 988. This is open 24 hours a day. Text the Crisis Text Line at 734-298-5093. These symptoms may be an emergency. Get help right away. Call 911. Do not wait to see if the symptoms will go away. Do not drive yourself to the hospital. Summary Psychosis refers to a severe loss of contact with reality. People having a psychotic episode are not able to think clearly, and they may have delusions or hallucinations. The health care provider may refer you to a mental health professional for further tests. Treatment may include therapy and other supportive programs such as individual or family therapy. Contact a health care provider if medicines do not seem to be helping. This information is not intended to replace advice given to you by your health care provider. Make sure you discuss any questions you have with your health care provider. Document Revised: 06/03/2021 Document Reviewed: 06/03/2021 Elsevier Patient Education  [employer].

## 2022-08-22 LAB — COMPLETE METABOLIC PANEL WITH GFR
AG Ratio: 1.6 (calc) (ref 1.0–2.5)
ALT: 7 U/L — ABNORMAL LOW (ref 9–46)
AST: 11 U/L (ref 10–35)
Albumin: 4.4 g/dL (ref 3.6–5.1)
Alkaline phosphatase (APISO): 52 U/L (ref 35–144)
BUN: 15 mg/dL (ref 7–25)
CO2: 30 mmol/L (ref 20–32)
Calcium: 10.2 mg/dL (ref 8.6–10.3)
Chloride: 102 mmol/L (ref 98–110)
Creat: 1.27 mg/dL (ref 0.70–1.28)
Globulin: 2.8 g/dL (calc) (ref 1.9–3.7)
Glucose, Bld: 138 mg/dL — ABNORMAL HIGH (ref 65–99)
Potassium: 6 mmol/L — ABNORMAL HIGH (ref 3.5–5.3)
Sodium: 140 mmol/L (ref 135–146)
Total Bilirubin: 0.3 mg/dL (ref 0.2–1.2)
Total Protein: 7.2 g/dL (ref 6.1–8.1)
eGFR: 58 mL/min/{1.73_m2} — ABNORMAL LOW (ref >=60)

## 2022-08-22 LAB — CBC
HCT: 39.8 % (ref 38.5–50.0)
Hemoglobin: 13.7 g/dL (ref 13.2–17.1)
MCH: 32.6 pg (ref 27.0–33.0)
MCHC: 34.4 g/dL (ref 32.0–36.0)
MCV: 94.8 fL (ref 80.0–100.0)
MPV: 11.4 fL (ref 7.5–12.5)
Platelets: 185 10*3/uL (ref 140–400)
RBC: 4.2 10*6/uL (ref 4.20–5.80)
RDW: 11.8 % (ref 11.0–15.0)
WBC: 6 10*3/uL (ref 3.8–10.8)

## 2022-08-22 LAB — LIPID PANEL
Cholesterol: 158 mg/dL (ref <200)
HDL: 43 mg/dL (ref >=40)
LDL Cholesterol (Calc): 83 mg/dL (calc)
Non-HDL Cholesterol (Calc): 115 mg/dL (calc) (ref <130)
Total CHOL/HDL Ratio: 3.7 (calc) (ref <5.0)
Triglycerides: 231 mg/dL — ABNORMAL HIGH (ref <150)

## 2022-08-22 LAB — TSH: TSH: 1.87 mIU/L (ref 0.40–4.50)

## 2022-08-22 LAB — HEMOGLOBIN A1C
Hgb A1c MFr Bld: 6.5 % of total Hgb — ABNORMAL HIGH (ref <5.7)
Mean Plasma Glucose: 140 mg/dL
eAG (mmol/L): 7.7 mmol/L

## 2022-08-26 ENCOUNTER — Encounter: Payer: Self-pay | Admitting: Internal Medicine

## 2022-08-27 ENCOUNTER — Other Ambulatory Visit: Payer: Self-pay | Admitting: Internal Medicine

## 2022-08-27 ENCOUNTER — Ambulatory Visit (INDEPENDENT_AMBULATORY_CARE_PROVIDER_SITE_OTHER): Payer: Medicare HMO

## 2022-08-27 DIAGNOSIS — G4700 Insomnia, unspecified: Secondary | ICD-10-CM

## 2022-08-27 DIAGNOSIS — M2142 Flat foot [pes planus] (acquired), left foot: Secondary | ICD-10-CM

## 2022-08-27 DIAGNOSIS — F03918 Unspecified dementia, unspecified severity, with other behavioral disturbance: Secondary | ICD-10-CM

## 2022-08-27 DIAGNOSIS — E1142 Type 2 diabetes mellitus with diabetic polyneuropathy: Secondary | ICD-10-CM | POA: Diagnosis not present

## 2022-08-27 DIAGNOSIS — G20A1 Parkinson's disease without dyskinesia, without mention of fluctuations: Secondary | ICD-10-CM

## 2022-08-27 DIAGNOSIS — F32A Depression, unspecified: Secondary | ICD-10-CM

## 2022-08-27 DIAGNOSIS — M2141 Flat foot [pes planus] (acquired), right foot: Secondary | ICD-10-CM | POA: Diagnosis not present

## 2022-08-27 NOTE — Progress Notes (Signed)
Pt present to pick up diabetic shoes. Tried and instructions given.

## 2022-08-28 NOTE — Telephone Encounter (Signed)
Requested medication (s) are due for refill today: yes  Requested medication (s) are on the active medication list: yes  Last refill:  07/23/22 #45 0 refills  Future visit scheduled: no  Notes to clinic:  not delegated per protocol do you want to refill Rx?     Requested Prescriptions  Pending Prescriptions Disp Refills   LORazepam (ATIVAN) 0.5 MG tablet [Pharmacy Med Name: LORAZEPAM 0.5 MG TABLET] 45 tablet 0    Sig: TAKE 1 TO 2 TABLETS BY MOUTH TWICE A DAY AS NEEDED     Not Delegated - Psychiatry: Anxiolytics/Hypnotics 2 Failed - 08/27/2022 10:41 AM      Failed - This refill cannot be delegated      Failed - Urine Drug Screen completed in last 360 days      Passed - Patient is not pregnant      Passed - Valid encounter within last 6 months    Recent Outpatient Visits           1 week ago 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[facility])   [facility] [place], Coralie Keens, NP   11 months ago Encounter for general adult medical examination with abnormal findings   [facility] [place], Coralie Keens, NP   1 year ago Aortic atherosclerosis [facility])   [facility] [place], Coralie Keens, NP   1 year ago Sacral pain   [facility] [place], Coralie Keens, NP              Signed Prescriptions Disp Refills   tamsulosin (FLOMAX) 0.4 MG CAPS capsule 90 capsule 1    Sig: TAKE 1 CAPSULE BY MOUTH EVERY DAY     Urology: Alpha-Adrenergic Blocker Failed - 08/27/2022 10:41 AM      Failed - Last BP in normal range    BP Readings from Last 1 Encounters:  08/21/22 (!) 156/78         Passed - PSA in normal range and within 360 days    PSA  Date Value Ref Range Status  09/20/2021 0.66 < OR = 4.00 ng/mL Final    Comment:    The total PSA value from this assay system is  standardized against the WHO standard. The test  result will be approximately 20% lower when compared  to the equimolar-standardized total  PSA (Beckman  Coulter). Comparison of serial PSA results should be  interpreted with this fact in mind. . This test was performed using the Siemens  chemiluminescent method. Values obtained from  different assay methods cannot be used interchangeably. PSA levels, regardless of value, should not be interpreted as absolute evidence of the presence or absence of disease.   06/01/2019 0.56 0.10 - 4.00 ng/ml Final    Comment:    Test performed using Access Hybritech PSA Assay, a parmagnetic partical, chemiluminecent immunoassay.         Passed - Valid encounter within last 12 months    Recent Outpatient Visits           1 week ago 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[facility])   [facility] [place], Coralie Keens, NP   11 months ago Encounter for general adult medical examination with abnormal findings   [facility] [place], Coralie Keens, NP   1 year ago Aortic atherosclerosis [facility])   [facility] [place], Coralie Keens, NP   1 year  ago Sacral pain   [facility] [place], Coralie Keens, [place]

## 2022-08-28 NOTE — Telephone Encounter (Signed)
Requested Prescriptions  Pending Prescriptions Disp Refills   tamsulosin (FLOMAX) 0.4 MG CAPS capsule [Pharmacy Med Name: TAMSULOSIN HCL 0.4 MG CAPSULE] 90 capsule 1    Sig: TAKE 1 CAPSULE BY MOUTH EVERY DAY     Urology: Alpha-Adrenergic Blocker Failed - 08/27/2022 10:41 AM      Failed - Last BP in normal range    BP Readings from Last 1 Encounters:  08/21/22 (!) 156/78         Passed - PSA in normal range and within 360 days    PSA  Date Value Ref Range Status  09/20/2021 0.66 < OR = 4.00 ng/mL Final    Comment:    The total PSA value from this assay system is  standardized against the WHO standard. The test  result will be approximately 20% lower when compared  to the equimolar-standardized total PSA (Beckman  Coulter). Comparison of serial PSA results should be  interpreted with this fact in mind. . This test was performed using the Siemens  chemiluminescent method. Values obtained from  different assay methods cannot be used interchangeably. PSA levels, regardless of value, should not be interpreted as absolute evidence of the presence or absence of disease.   06/01/2019 0.56 0.10 - 4.00 ng/ml Final    Comment:    Test performed using Access Hybritech PSA Assay, a parmagnetic partical, chemiluminecent immunoassay.         Passed - Valid encounter within last 12 months    Recent Outpatient Visits           1 week ago 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[facility])   [facility] [place], Coralie Keens, NP   11 months ago Encounter for general adult medical examination with abnormal findings   [facility] [place], Coralie Keens, NP   1 year ago Aortic atherosclerosis [facility])   [facility] [place], Coralie Keens, NP   1 year ago Sacral pain   [facility] [place], Coralie Keens, NP               LORazepam (ATIVAN) 0.5 MG tablet [Pharmacy Med Name: LORAZEPAM 0.5 MG TABLET] 45 tablet 0     Sig: TAKE 1 TO 2 TABLETS BY MOUTH TWICE A DAY AS NEEDED     Not Delegated - Psychiatry: Anxiolytics/Hypnotics 2 Failed - 08/27/2022 10:41 AM      Failed - This refill cannot be delegated      Failed - Urine Drug Screen completed in last 360 days      Passed - Patient is not pregnant      Passed - Valid encounter within last 6 months    Recent Outpatient Visits           1 week ago 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[facility])   [facility] [place], Coralie Keens, NP   11 months ago Encounter for general adult medical examination with abnormal findings   [facility] [place], Coralie Keens, NP   1 year ago Aortic atherosclerosis [facility])   [facility] [place], Coralie Keens, NP   1 year ago Sacral pain   [facility] [place], Coralie Keens, [place]

## 2022-08-28 NOTE — Telephone Encounter (Signed)
Requested Prescriptions  Pending Prescriptions Disp Refills   carbidopa-levodopa (SINEMET IR) 25-100 MG tablet [Pharmacy Med Name: CARBIDOPA/LEVODOPA 25-100 MG Tablet] 270 tablet 0    Sig: TAKE 1 TABLET THREE TIMES DAILY (AT 7AM, 11AM, 4PM)     Neurology:  Parkinsonian Agents 2 Failed - 08/27/2022  1:53 AM      Failed - ALT in normal range and within 360 days    ALT  Date Value Ref Range Status  08/21/2022 7 (L) 9 - 46 U/L Final         Failed - Last BP in normal range    BP Readings from Last 1 Encounters:  08/21/22 (!) 156/78         Passed - WBC in normal range and within 360 days    WBC  Date Value Ref Range Status  08/21/2022 6.0 3.8 - 10.8 Thousand/uL Final         Passed - PLT in normal range and within 360 days    Platelets  Date Value Ref Range Status  08/21/2022 185 140 - 400 Thousand/uL Final         Passed - HGB in normal range and within 360 days    Hemoglobin  Date Value Ref Range Status  08/21/2022 13.7 13.2 - 17.1 g/dL Final         Passed - HCT in normal range and within 360 days    HCT  Date Value Ref Range Status  08/21/2022 39.8 38.5 - 50.0 % Final         Passed - Cr in normal range and within 360 days    Creat  Date Value Ref Range Status  08/21/2022 1.27 0.70 - 1.28 mg/dL Final   Creatinine,U  Date Value Ref Range Status  03/20/2017 115.3 mg/dL Final   Creatinine, Urine  Date Value Ref Range Status  09/20/2021 209 20 - 320 mg/dL Final         Passed - AST in normal range and within 360 days    AST  Date Value Ref Range Status  08/21/2022 11 10 - 35 U/L Final         Passed - Valid encounter within last 12 months    Recent Outpatient Visits           1 week ago 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[facility])   [facility] [place], Coralie Keens, NP   11 months ago Encounter for general adult medical examination with abnormal findings   [facility] [place], Coralie Keens, NP   1 year ago  Aortic atherosclerosis [facility])   [facility] [place], Coralie Keens, NP   1 year ago Sacral pain   [facility] [place], [place] W, NP               donepezil (ARICEPT) 10 MG tablet [Pharmacy Med Name: DONEPEZIL HYDROCHLORIDE 10 MG Tablet] 90 tablet 0    Sig: TAKE [place]     Neurology:  Alzheimer's Agents Passed - 08/27/2022  1:53 AM      Passed - Valid encounter within last 6 months    Recent Outpatient Visits           1 week ago 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[facility])   [facility] [place], Coralie Keens, NP   11 months ago Encounter for general adult medical examination with abnormal findings  [facility] [place], [place] W, NP   1 year ago Aortic atherosclerosis [facility])   [facility] [place], Coralie Keens, NP   1 year ago Sacral pain   [facility] [place], [place], NP               divalproex (DEPAKOTE ER) 500 MG 24 hr tablet [Pharmacy Med Name: DIVALPROEX SODIUM ER 500 MG Tablet Extended Release 24 Hour] 180 tablet 0    Sig: TAKE 2 TABLETS EVERY DAY     Neurology:  Anticonvulsants - Valproates Failed - 08/27/2022  1:53 AM      Failed - ALT in normal range and within 360 days    ALT  Date Value Ref Range Status  08/21/2022 7 (L) 9 - 46 U/L Final         Failed - Valproic Acid (serum) in normal range and within 360 days    Valproic Acid Lvl  Date Value Ref Range Status  12/17/2016 53 50.0 - 100.0 ug/mL Final         Passed - AST in normal range and within 360 days    AST  Date Value Ref Range Status  08/21/2022 11 10 - 35 U/L Final         Passed - HGB in normal range and within 360 days    Hemoglobin  Date Value Ref Range Status  08/21/2022 13.7 13.2 - 17.1 g/dL Final         Passed - PLT in normal range and within 360 days    Platelets  Date Value Ref Range Status  08/21/2022 185 140 - 400  Thousand/uL Final         Passed - WBC in normal range and within 360 days    WBC  Date Value Ref Range Status  08/21/2022 6.0 3.8 - 10.8 Thousand/uL Final         Passed - HCT in normal range and within 360 days    HCT  Date Value Ref Range Status  08/21/2022 39.8 38.5 - 50.0 % Final         Passed - Completed PHQ-2 or PHQ-9 in the last 360 days      Passed - Patient is not pregnant      Passed - Valid encounter within last 12 months    Recent Outpatient Visits           1 week ago 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[facility])   [facility] [place], Coralie Keens, NP   11 months ago Encounter for general adult medical examination with abnormal findings   [facility] [place], Coralie Keens, NP   1 year ago Aortic atherosclerosis [facility])   [facility] [place], Coralie Keens, NP   1 year ago Sacral pain   [facility] [place], [place] W, NP               simvastatin (ZOCOR) 40 MG tablet [Pharmacy Med Name: SIMVASTATIN 40 MG Tablet] 90 tablet 0    Sig: TAKE 1 TABLET EVERY DAY AT 6:00PM     Cardiovascular:  Antilipid - Statins Failed - 08/27/2022  1:53 AM      Failed - Lipid Panel in normal range within the last 12 months    Cholesterol  Date Value Ref Range Status  08/21/2022 158 <200 mg/dL Final  LDL Cholesterol (Calc)  Date Value Ref Range Status  08/21/2022 83 mg/dL (calc) Final    Comment:    Reference range: <100 . Desirable range <100 mg/dL for primary prevention;   <70 mg/dL for patients with CHD or diabetic patients  with > or = 2 CHD risk factors. Marland Kitchen LDL-C is now calculated using the Martin-Hopkins  calculation, which is a validated novel method providing  better accuracy than the Friedewald equation in the  estimation of LDL-C.  Cresenciano Genre et al. Annamaria Helling. 8786;767(20): 2061-2068  (http://education.[employer].com/faq/FAQ164)    Direct LDL  Date Value Ref Range  Status  04/27/2019 92.0 mg/dL Final    Comment:    Optimal:  <100 mg/dLNear or Above Optimal:  100-129 mg/dLBorderline High:  130-159 mg/dLHigh:  160-189 mg/dLVery High:  >190 mg/dL   HDL  Date Value Ref Range Status  08/21/2022 43 > OR = 40 mg/dL Final   Triglycerides  Date Value Ref Range Status  08/21/2022 231 (H) <150 mg/dL Final    Comment:    . If a non-fasting specimen was collected, consider repeat triglyceride testing on a fasting specimen if clinically indicated.  Yates Decamp et al. J. of Clin. Lipidol. 9470;9:628-366. Marland Kitchen          Passed - Patient is not pregnant      Passed - Valid encounter within last 12 months    Recent Outpatient Visits           1 week ago 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[facility])   [facility] [place], Coralie Keens, NP   11 months ago Encounter for general adult medical examination with abnormal findings   [facility] [place], Coralie Keens, NP   1 year ago Aortic atherosclerosis [facility])   [facility] [place], Coralie Keens, NP   1 year ago Sacral pain   [facility] [place], Coralie Keens, NP               citalopram (CELEXA) 20 MG tablet [Pharmacy Med Name: CITALOPRAM HYDROBROMIDE 20 MG Tablet] 90 tablet 0    Sig: TAKE [place]     Psychiatry:  Antidepressants - SSRI Passed - 08/27/2022  1:53 AM      Passed - Valid encounter within last 6 months    Recent Outpatient Visits           1 week ago 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[facility])   [facility] [place], Coralie Keens, NP   11 months ago Encounter for general adult medical examination with abnormal findings   [facility] [place], Coralie Keens, NP   1 year ago Aortic atherosclerosis [facility])   [facility] [place], Coralie Keens, NP   1 year ago Sacral pain   [facility] [place], Coralie Keens, [place]

## 2022-09-13 ENCOUNTER — Other Ambulatory Visit: Payer: Self-pay | Admitting: Internal Medicine

## 2022-09-13 NOTE — Telephone Encounter (Signed)
Rx no longer on current medication list Requested Prescriptions  Pending Prescriptions Disp Refills   memantine (NAMENDA XR) 14 MG CP24 24 hr capsule [Pharmacy Med Name: MEMANTINE HCL ER 14 MG CAPSULE] 90 capsule 0    Sig: TAKE 1 CAPSULE (14 MG TOTAL) BY MOUTH DAILY.     Neurology:  Alzheimer's Agents 2 Passed - 09/13/2022  2:19 PM      Passed - Cr in normal range and within 360 days    Creat  Date Value Ref Range Status  08/21/2022 1.27 0.70 - 1.28 mg/dL Final   Creatinine,U  Date Value Ref Range Status  03/20/2017 115.3 mg/dL Final   Creatinine, Urine  Date Value Ref Range Status  09/20/2021 209 20 - 320 mg/dL Final         Passed - eGFR is 5 or above and within 360 days    GFR calc Af Amer  Date Value Ref Range Status  12/25/2019 >60 >60 mL/min Final   GFR, Estimated  Date Value Ref Range Status  01/23/2021 >60 >60 mL/min Final    Comment:    (NOTE) Calculated using the CKD-EPI Creatinine Equation (2021)    GFR  Date Value Ref Range Status  01/06/2020 59.40 (L) >60.00 mL/min Final   eGFR  Date Value Ref Range Status  08/21/2022 58 (L) > OR = 60 mL/min/1.66m Final         Passed - Valid encounter within last 6 months    Recent Outpatient Visits           3 weeks ago 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([facility]   [facility]   11 months ago Encounter for general adult medical examination with abnormal findings   [facility]   1 year ago Aortic atherosclerosis ([facility]   [facility]   1 year ago Sacral pain   [facility] [place]

## 2022-09-18 ENCOUNTER — Other Ambulatory Visit: Payer: Self-pay | Admitting: Internal Medicine

## 2022-09-18 DIAGNOSIS — D225 Melanocytic nevi of trunk: Secondary | ICD-10-CM | POA: Diagnosis not present

## 2022-09-18 DIAGNOSIS — R208 Other disturbances of skin sensation: Secondary | ICD-10-CM | POA: Diagnosis not present

## 2022-09-18 DIAGNOSIS — L814 Other melanin hyperpigmentation: Secondary | ICD-10-CM | POA: Diagnosis not present

## 2022-09-18 DIAGNOSIS — Z08 Encounter for follow-up examination after completed treatment for malignant neoplasm: Secondary | ICD-10-CM | POA: Diagnosis not present

## 2022-09-18 DIAGNOSIS — L570 Actinic keratosis: Secondary | ICD-10-CM | POA: Diagnosis not present

## 2022-09-18 DIAGNOSIS — L821 Other seborrheic keratosis: Secondary | ICD-10-CM | POA: Diagnosis not present

## 2022-09-18 DIAGNOSIS — L538 Other specified erythematous conditions: Secondary | ICD-10-CM | POA: Diagnosis not present

## 2022-09-18 DIAGNOSIS — H61032 Chondritis of left external ear: Secondary | ICD-10-CM | POA: Diagnosis not present

## 2022-09-18 DIAGNOSIS — Z85828 Personal history of other malignant neoplasm of skin: Secondary | ICD-10-CM | POA: Diagnosis not present

## 2022-09-18 NOTE — Telephone Encounter (Signed)
Dc'd 08/26/22 Webb Silversmith NP  Requested Prescriptions  Refused Prescriptions Disp Refills   memantine (NAMENDA XR) 14 MG CP24 24 hr capsule [Pharmacy Med Name: MEMANTINE HCL ER 14 MG CAPSULE] 90 capsule 0    Sig: TAKE 1 CAPSULE (14 MG TOTAL) BY MOUTH DAILY.     Neurology:  Alzheimer's Agents 2 Passed - 09/18/2022 10:40 AM      Passed - Cr in normal range and within 360 days    Creat  Date Value Ref Range Status  08/21/2022 1.27 0.70 - 1.28 mg/dL Final   Creatinine,U  Date Value Ref Range Status  03/20/2017 115.3 mg/dL Final   Creatinine, Urine  Date Value Ref Range Status  09/20/2021 209 20 - 320 mg/dL Final         Passed - eGFR is 5 or above and within 360 days    GFR calc Af Amer  Date Value Ref Range Status  12/25/2019 >60 >60 mL/min Final   GFR, Estimated  Date Value Ref Range Status  01/23/2021 >60 >60 mL/min Final    Comment:    (NOTE) Calculated using the CKD-EPI Creatinine Equation (2021)    GFR  Date Value Ref Range Status  01/06/2020 59.40 (L) >60.00 mL/min Final   eGFR  Date Value Ref Range Status  08/21/2022 58 (L) > OR = 60 mL/min/1.31m Final         Passed - Valid encounter within last 6 months    Recent Outpatient Visits           4 weeks ago 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([facility]   [facility]   12 months ago Encounter for general adult medical examination with abnormal findings   [facility]   1 year ago Aortic atherosclerosis ([facility]   [facility]   1 year ago Sacral pain   [facility] [place]

## 2022-09-28 ENCOUNTER — Other Ambulatory Visit: Payer: Self-pay | Admitting: Neurology

## 2022-09-28 DIAGNOSIS — G20A1 Parkinson's disease without dyskinesia, without mention of fluctuations: Secondary | ICD-10-CM

## 2022-10-02 DIAGNOSIS — M546 Pain in thoracic spine: Secondary | ICD-10-CM | POA: Diagnosis not present

## 2022-10-02 DIAGNOSIS — R296 Repeated falls: Secondary | ICD-10-CM | POA: Diagnosis not present

## 2022-10-02 DIAGNOSIS — M5416 Radiculopathy, lumbar region: Secondary | ICD-10-CM | POA: Diagnosis not present

## 2022-10-02 DIAGNOSIS — G20A1 Parkinson's disease without dyskinesia, without mention of fluctuations: Secondary | ICD-10-CM | POA: Diagnosis not present

## 2022-10-10 ENCOUNTER — Other Ambulatory Visit: Payer: Self-pay | Admitting: Internal Medicine

## 2022-10-10 NOTE — Telephone Encounter (Signed)
Requested medication (s) are due for refill today: yes  Requested medication (s) are on the active medication list: yes  Last refill:  ativan- 08/28/22 #45 0 refills, zestril-05/30/22 #90 1 refills   Future visit scheduled: no   Notes to clinic:  not delegated per protocol. Ativan. Last labs 08/21/22 potassium 6.0. protocol failed. Do you want to refill Rxs?     Requested Prescriptions  Pending Prescriptions Disp Refills   LORazepam (ATIVAN) 0.5 MG tablet [Pharmacy Med Name: LORAZEPAM 0.5 MG TABLET] 45 tablet 0    Sig: TAKE 1 TO 2 TABLETS BY MOUTH TWICE A DAY AS NEEDED     Not Delegated - Psychiatry: Anxiolytics/Hypnotics 2 Failed - 10/10/2022 11:10 AM      Failed - This refill cannot be delegated      Failed - Urine Drug Screen completed in last 360 days      Passed - Patient is not pregnant      Passed - Valid encounter within last 6 months    Recent Outpatient Visits           1 month ago 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[facility])   [facility] [place], Coralie Keens, NP   1 year ago Encounter for general adult medical examination with abnormal findings   [facility] [place], Coralie Keens, NP   1 year ago Aortic atherosclerosis [facility])   [facility] [place], Coralie Keens, NP   1 year ago Sacral pain   [facility] [place], [place] W, NP               lisinopril (ZESTRIL) 5 MG tablet [Pharmacy Med Name: LISINOPRIL 5 MG TABLET] 90 tablet 1    Sig: TAKE 1 TABLET (5 MG TOTAL) BY MOUTH DAILY.     Cardiovascular:  ACE Inhibitors Failed - 10/10/2022 11:10 AM      Failed - K in normal range and within 180 days    Potassium  Date Value Ref Range Status  08/21/2022 6.0 (H) 3.5 - 5.3 mmol/L Final         Failed - Last BP in normal range    BP Readings from Last 1 Encounters:  08/21/22 (!) 156/78         Passed - Cr in normal range and within 180 days    Creat  Date Value Ref Range  Status  08/21/2022 1.27 0.70 - 1.28 mg/dL Final   Creatinine,U  Date Value Ref Range Status  03/20/2017 115.3 mg/dL Final   Creatinine, Urine  Date Value Ref Range Status  09/20/2021 209 20 - 320 mg/dL Final         Passed - Patient is not pregnant      Passed - Valid encounter within last 6 months    Recent Outpatient Visits           1 month ago 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[facility])   [facility] [place], Coralie Keens, NP   1 year ago Encounter for general adult medical examination with abnormal findings   [facility] [place], Coralie Keens, NP   1 year ago Aortic atherosclerosis [facility])   [facility] [place], Coralie Keens, NP   1 year ago Sacral pain   [facility] [place], Coralie Keens, [place]

## 2022-10-11 ENCOUNTER — Other Ambulatory Visit: Payer: Self-pay | Admitting: Internal Medicine

## 2022-10-11 NOTE — Telephone Encounter (Signed)
Requested Prescriptions  Pending Prescriptions Disp Refills   mirtazapine (REMERON) 45 MG tablet [Pharmacy Med Name: MIRTAZAPINE 45 MG Tablet] 90 tablet 0    Sig: TAKE 1 TABLET AT BEDTIME     Psychiatry: Antidepressants - mirtazapine Passed - 10/11/2022 12:50 AM      Passed - Valid encounter within last 6 months    Recent Outpatient Visits           1 month ago 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[facility])   [facility] [place], [place], [place]   1 year ago Encounter for general adult medical examination with abnormal findings   [facility] [place], [place]alie Keens, NP   1 year ago Aortic atherosclerosis [facility])   [facility] [place], Coralie Keens, NP   1 year ago Sacral pain   [facility] [place], Coralie Keens, NP               metFORMIN (GLUCOPHAGE) 500 MG tablet [Pharmacy Med Name: METFORMIN HYDROCHLORIDE 500 MG Tablet] 180 tablet 0    Sig: TAKE 1 TABLET TWICE DAILY WITH MEALS     Endocrinology:  Diabetes - Biguanides Failed - 10/11/2022 12:50 AM      Failed - eGFR in normal range and within 360 days    GFR calc Af Amer  Date Value Ref Range Status  12/25/2019 >60 >60 mL/min Final   GFR, Estimated  Date Value Ref Range Status  01/23/2021 >60 >60 mL/min Final    Comment:    (NOTE) Calculated using the CKD-EPI Creatinine Equation (2021)    GFR  Date Value Ref Range Status  01/06/2020 59.40 (L) >60.00 mL/min Final   eGFR  Date Value Ref Range Status  08/21/2022 58 (L) > OR = 60 mL/min/1.85m2 Final         Failed - B12 Level in normal range and within 720 days    Vitamin B-12  Date Value Ref Range Status  12/21/2019 325 180 - 914 pg/mL Final    Comment:    (NOTE) This assay is not validated for testing neonatal or myeloproliferative syndrome specimens for Vitamin B12 levels. Performed at [facility], [place] [address], [place], [place] [address]          Failed - CBC  within normal limits and completed in the last 12 months    WBC  Date Value Ref Range Status  08/21/2022 6.0 3.8 - 10.8 Thousand/uL Final   RBC  Date Value Ref Range Status  08/21/2022 4.20 4.20 - 5.80 Million/uL Final   Hemoglobin  Date Value Ref Range Status  08/21/2022 13.7 13.2 - 17.1 g/dL Final   HCT  Date Value Ref Range Status  08/21/2022 39.8 38.5 - 50.0 % Final   MCHC  Date Value Ref Range Status  08/21/2022 34.4 32.0 - 36.0 g/dL Final   [facility]  Date Value Ref Range Status  08/21/2022 32.6 27.0 - 33.0 pg Final   MCV  Date Value Ref Range Status  08/21/2022 94.8 80.0 - 100.0 fL Final   No results found for: "PLTCOUNTKUC", "LABPLAT", "POCPLA" RDW  Date Value Ref Range Status  08/21/2022 11.8 11.0 - 15.0 % Final  10/11/2014 13.5 12.3 - 15.4 % Final         Passed - Cr in normal range and within 360 days    Creat  Date Value Ref Range Status  08/21/2022 1.27 0.70 - 1.28  mg/dL Final   Creatinine,U  Date Value Ref Range Status  03/20/2017 115.3 mg/dL Final   Creatinine, Urine  Date Value Ref Range Status  09/20/2021 209 20 - 320 mg/dL Final         Passed - HBA1C is between 0 and 7.9 and within 180 days    Hgb A1c MFr Bld  Date Value Ref Range Status  08/21/2022 6.5 (H) <5.7 % of total Hgb Final    Comment:    For someone without known diabetes, a hemoglobin A1c value of 6.5% or greater indicates that they may have  diabetes and this should be confirmed with a follow-up  test. . For someone with known diabetes, a value <7% indicates  that their diabetes is well controlled and a value  greater than or equal to 7% indicates suboptimal  control. A1c targets should be individualized based on  duration of diabetes, age, comorbid conditions, and  other considerations. . Currently, no consensus exists regarding use of hemoglobin A1c for diagnosis of diabetes for children. Renella Cunas - Valid encounter within last 6 months    Recent Outpatient  Visits           1 month ago 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[facility])   [facility] [place], [place]alie Keens, NP   1 year ago Encounter for general adult medical examination with abnormal findings   [facility] [place], Coralie Keens, NP   1 year ago Aortic atherosclerosis [facility])   [facility] [place], Coralie Keens, NP   1 year ago Sacral pain   [facility] [place], Coralie Keens, NP               meloxicam (MOBIC) 15 MG tablet [Pharmacy Med Name: MELOXICAM 15 MG Tablet] 90 tablet 3    Sig: TAKE 1 TABLET EVERY DAY WITH SUPPER     Analgesics:  COX2 Inhibitors Failed - 10/11/2022 12:50 AM      Failed - Manual Review: Labs are only required if the patient has taken medication for more than 8 weeks.      Failed - ALT in normal range and within 360 days    ALT  Date Value Ref Range Status  08/21/2022 7 (L) 9 - 46 U/L Final         Passed - HGB in normal range and within 360 days    Hemoglobin  Date Value Ref Range Status  08/21/2022 13.7 13.2 - 17.1 g/dL Final         Passed - Cr in normal range and within 360 days    Creat  Date Value Ref Range Status  08/21/2022 1.27 0.70 - 1.28 mg/dL Final   Creatinine,U  Date Value Ref Range Status  03/20/2017 115.3 mg/dL Final   Creatinine, Urine  Date Value Ref Range Status  09/20/2021 209 20 - 320 mg/dL Final         Passed - HCT in normal range and within 360 days    HCT  Date Value Ref Range Status  08/21/2022 39.8 38.5 - 50.0 % Final         Passed - AST in normal range and within 360 days    AST  Date Value Ref Range Status  08/21/2022 11 10 - 35 U/L Final         Passed - eGFR is 30 or  above and within 360 days    GFR calc Af Amer  Date Value Ref Range Status  12/25/2019 >60 >60 mL/min Final   GFR, Estimated  Date Value Ref Range Status  01/23/2021 >60 >60 mL/min Final    Comment:    (NOTE) Calculated using the CKD-EPI  Creatinine Equation (2021)    GFR  Date Value Ref Range Status  01/06/2020 59.40 (L) >60.00 mL/min Final   eGFR  Date Value Ref Range Status  08/21/2022 58 (L) > OR = 60 mL/min/1.33m2 Final         Passed - Patient is not pregnant      Passed - Valid encounter within last 12 months    Recent Outpatient Visits           1 month ago 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[facility])   [facility] [place], Coralie Keens, NP   1 year ago Encounter for general adult medical examination with abnormal findings   [facility] [place], Coralie Keens, NP   1 year ago Aortic atherosclerosis [facility])   [facility], Coralie Keens, NP   1 year ago Sacral pain   [facility] [place], [place]alie Keens, [place]

## 2022-10-17 ENCOUNTER — Encounter: Payer: Self-pay | Admitting: Internal Medicine

## 2022-10-17 ENCOUNTER — Ambulatory Visit (INDEPENDENT_AMBULATORY_CARE_PROVIDER_SITE_OTHER): Payer: Medicare HMO | Admitting: Internal Medicine

## 2022-10-17 VITALS — BP 136/72 | HR 82 | Temp 96.8°F | Ht 72.0 in | Wt 176.0 lb

## 2022-10-17 DIAGNOSIS — Z23 Encounter for immunization: Secondary | ICD-10-CM | POA: Diagnosis not present

## 2022-10-17 DIAGNOSIS — Z0001 Encounter for general adult medical examination with abnormal findings: Secondary | ICD-10-CM | POA: Diagnosis not present

## 2022-10-17 DIAGNOSIS — Z122 Encounter for screening for malignant neoplasm of respiratory organs: Secondary | ICD-10-CM | POA: Diagnosis not present

## 2022-10-17 DIAGNOSIS — R300 Dysuria: Secondary | ICD-10-CM

## 2022-10-17 DIAGNOSIS — E118 Type 2 diabetes mellitus with unspecified complications: Secondary | ICD-10-CM

## 2022-10-17 DIAGNOSIS — Z125 Encounter for screening for malignant neoplasm of prostate: Secondary | ICD-10-CM | POA: Diagnosis not present

## 2022-10-17 LAB — POCT URINALYSIS DIPSTICK
Bilirubin, UA: NEGATIVE
Blood, UA: NEGATIVE
Glucose, UA: NEGATIVE
Ketones, UA: NEGATIVE
Leukocytes, UA: NEGATIVE
Nitrite, UA: NEGATIVE
Protein, UA: POSITIVE — AB
Spec Grav, UA: 1.025 (ref 1.010–1.025)
Urobilinogen, UA: 0.2 E.U./dL
pH, UA: 6 (ref 5.0–8.0)

## 2022-10-17 MED ORDER — PANTOPRAZOLE SODIUM 20 MG PO TBEC: 20.0000 mg | DELAYED_RELEASE_TABLET | Freq: Every day | ORAL | 1 refills | Status: DC

## 2022-10-17 MED ORDER — CIPROFLOXACIN HCL 500 MG PO TABS: 500.0000 mg | ORAL_TABLET | Freq: Two times a day (BID) | ORAL | 0 refills | Status: DC

## 2022-10-17 NOTE — Addendum Note (Signed)
Addended by: Ashley Royalty E on: 10/17/2022 10:29 AM   Modules accepted: Orders

## 2022-10-17 NOTE — Patient Instructions (Signed)
Health Maintenance, Male Adopting a healthy lifestyle and getting preventive care are important in promoting health and wellness. Ask your health care provider about: The right schedule for you to have regular tests and exams. Things you can do on your own to prevent diseases and keep yourself healthy. What should I know about diet, weight, and exercise? Eat a healthy diet  Eat a diet that includes plenty of vegetables, fruits, low-fat dairy products, and lean protein. Do not eat a lot of foods that are high in solid fats, added sugars, or sodium. Maintain a healthy weight Body mass index (BMI) is a measurement that can be used to identify possible weight problems. It estimates body fat based on height and weight. Your health care provider can help determine your BMI and help you achieve or maintain a healthy weight. Get regular exercise Get regular exercise. This is one of the most important things you can do for your health. Most adults should: Exercise for at least 150 minutes each week. The exercise should increase your heart rate and make you sweat (moderate-intensity exercise). Do strengthening exercises at least twice a week. This is in addition to the moderate-intensity exercise. Spend less time sitting. Even light physical activity can be beneficial. Watch cholesterol and blood lipids Have your blood tested for lipids and cholesterol at 79 years of age, then have this test every 5 years. You may need to have your cholesterol levels checked more often if: Your lipid or cholesterol levels are high. You are older than 79 years of age. You are at high risk for heart disease. What should I know about cancer screening? Many types of cancers can be detected early and may often be prevented. Depending on your health history and family history, you may need to have cancer screening at various ages. This may include screening for: Colorectal cancer. Prostate cancer. Skin cancer. Lung  cancer. What should I know about heart disease, diabetes, and high blood pressure? Blood pressure and heart disease High blood pressure causes heart disease and increases the risk of stroke. This is more likely to develop in people who have high blood pressure readings or are overweight. Talk with your health care provider about your target blood pressure readings. Have your blood pressure checked: Every 3-5 years if you are 18-39 years of age. Every year if you are 40 years old or older. If you are between the ages of 65 and 75 and are a current or former smoker, ask your health care provider if you should have a one-time screening for abdominal aortic aneurysm (AAA). Diabetes Have regular diabetes screenings. This checks your fasting blood sugar level. Have the screening done: Once every three years after age 45 if you are at a normal weight and have a low risk for diabetes. More often and at a younger age if you are overweight or have a high risk for diabetes. What should I know about preventing infection? Hepatitis B If you have a higher risk for hepatitis B, you should be screened for this virus. Talk with your health care provider to find out if you are at risk for hepatitis B infection. Hepatitis C Blood testing is recommended for: Everyone born from 1945 through 1965. Anyone with known risk factors for hepatitis C. Sexually transmitted infections (STIs) You should be screened each year for STIs, including gonorrhea and chlamydia, if: You are sexually active and are younger than 79 years of age. You are older than 79 years of age and your   health care provider tells you that you are at risk for this type of infection. Your sexual activity has changed since you were last screened, and you are at increased risk for chlamydia or gonorrhea. Ask your health care provider if you are at risk. Ask your health care provider about whether you are at high risk for HIV. Your health care provider  may recommend a prescription medicine to help prevent HIV infection. If you choose to take medicine to prevent HIV, you should first get tested for HIV. You should then be tested every 3 months for as long as you are taking the medicine. Follow these instructions at home: Alcohol use Do not drink alcohol if your health care provider tells you not to drink. If you drink alcohol: Limit how much you have to 0-2 drinks a day. Know how much alcohol is in your drink. In the [place] one drink equals one 12 oz bottle of beer (355 mL), one 5 oz glass of wine (148 mL), or one 1 oz glass of hard liquor (44 mL). Lifestyle Do not use any products that contain nicotine or tobacco. These products include cigarettes, chewing tobacco, and vaping devices, such as e-cigarettes. If you need help quitting, ask your health care provider. Do not use street drugs. Do not share needles. Ask your health care provider for help if you need support or information about quitting drugs. General instructions Schedule regular health, dental, and eye exams. Stay current with your vaccines. Tell your health care provider if: You often feel depressed. You have ever been abused or do not feel safe at home. Summary Adopting a healthy lifestyle and getting preventive care are important in promoting health and wellness. Follow your health care provider's instructions about healthy diet, exercising, and getting tested or screened for diseases. Follow your health care provider's instructions on monitoring your cholesterol and blood pressure. This information is not intended to replace advice given to you by your health care provider. Make sure you discuss any questions you have with your health care provider. Document Revised: 11/27/2020 Document Reviewed: 11/27/2020 Elsevier Patient Education  2023 Elsevier Inc.  

## 2022-10-17 NOTE — Progress Notes (Signed)
Subjective:    Patient ID: Alexander Booth, male    DOB: Apr 18, 1946, 77 y.o.   MRN: [id number]  HPI  Patient presents to clinic today for his annual exam.  He also reports burning with urination.  This started a few weeks ago.  Flu: 04/2022 Tetanus: 08/2012 COVID: Pfizer x 4 Pneumovax: 04/2018 Prevnar: 09/2015 Zostavax: 09/2012 Shingrix: Never PSA screening: 09/2021 Colon screening: 09/2019 Vision screening: annually Dentist: as needed  Diet: He does eat meat. He consumes fruits and veggies. He tries to avoid fried foods. He drinks mostly water. Exercise: None  Review of Systems     Past Medical History:  Diagnosis Date   Arthritis    COPD (chronic obstructive pulmonary disease) (HCC)    Depression    Dyspnea    Erectile dysfunction    He's had a hx of    Hypercholesterolemia    Hypertension    Essential   Lumbosacral disc disease    with chronic low back pain, has implanted nerve stimulator   Peripheral neuropathy    Presenile dementia    Type 2 diabetes mellitus ([place]) 2010   takes oral meds    Current Outpatient Medications  Medication Sig Dispense Refill   Accu-Chek Softclix Lancets lancets Check sugar 2 times daily. DX E11.8 200 each 3   acetaminophen (TYLENOL) 500 MG tablet Take 1,000 mg by mouth every 6 (six) hours as needed for moderate pain.     Albuterol Sulfate (PROAIR RESPICLICK) 123XX123 (90 Base) MCG/ACT AEPB Inhale 1-2 puffs into the lungs every 4 (four) hours as needed. (Patient taking differently: Inhale 1-2 puffs into the lungs every 4 (four) hours as needed (shortness of breath).) 1 each 11   aspirin EC 81 MG tablet Take 81 mg by mouth daily with breakfast.     Blood Glucose Monitoring Suppl (ACCU-CHEK AVIVA PLUS) w/Device KIT Check sugar 2 times daily. DX E11.8 1 kit 12   BREO ELLIPTA 100-25 MCG/INH AEPB INHALE 1 PUFF BY MOUTH EVERY DAY 60 each 2   carbidopa-levodopa (SINEMET IR) 25-100 MG tablet TAKE 1 TABLET THREE TIMES DAILY (AT 7AM, 11AM, 4PM) 270  tablet 0   Cholecalciferol (QC VITAMIN D3) 50 MCG (2000 UT) TABS Take 2,000 Units by mouth daily with lunch.     citalopram (CELEXA) 20 MG tablet TAKE 1 TABLET EVERY DAY 90 tablet 0   cyclobenzaprine (FLEXERIL) 5 MG tablet Take 5 mg by mouth 2 (two) times daily.     divalproex (DEPAKOTE ER) 500 MG 24 hr tablet TAKE 2 TABLETS EVERY DAY 180 tablet 0   donepezil (ARICEPT) 10 MG tablet TAKE 1 TABLET EVERY DAY 90 tablet 0   doxycycline (VIBRA-TABS) 100 MG tablet Take 1 tablet (100 mg total) by mouth 2 (two) times daily. 20 tablet 0   famotidine (PEPCID) 10 MG tablet Take 2 tablets (20 mg total) by mouth daily. 180 tablet 3   gabapentin (NEURONTIN) 400 MG capsule TAKE 3 CAPSULES AT BEDTIME AND MAY REPEAT DOSE ONE TIME IF NEEDED 270 capsule 3   gentamicin cream (GARAMYCIN) 0.1 % Apply 1 Application topically 2 (two) times daily. 30 g 1   glucose blood (ACCU-CHEK AVIVA PLUS) test strip Check sugar 2 times daily. DX E11.8 200 each 3   glucose blood (ACCU-CHEK GUIDE) test strip Use as instructed (Patient not taking: Reported on 05/23/2022) 100 each 12   lisinopril (ZESTRIL) 5 MG tablet TAKE 1 TABLET (5 MG TOTAL) BY MOUTH DAILY. 90 tablet 0   LORazepam (  ATIVAN) 0.5 MG tablet TAKE 1 TO 2 TABLETS BY MOUTH TWICE A DAY AS NEEDED 45 tablet 0   meloxicam (MOBIC) 15 MG tablet TAKE 1 TABLET EVERY DAY WITH SUPPER 90 tablet 1   metFORMIN (GLUCOPHAGE) 500 MG tablet TAKE 1 TABLET TWICE DAILY WITH MEALS 180 tablet 0   mirtazapine (REMERON) 45 MG tablet TAKE 1 TABLET AT BEDTIME 90 tablet 0   nitroGLYCERIN (NITROSTAT) 0.4 MG SL tablet Place 0.4 mg under the tongue every 5 (five) minutes x 3 doses as needed for chest pain.     simvastatin (ZOCOR) 40 MG tablet TAKE 1 TABLET EVERY DAY AT 6:00PM 90 tablet 0   SYMPROIC 0.2 MG TABS      tamsulosin (FLOMAX) 0.4 MG CAPS capsule TAKE 1 CAPSULE BY MOUTH EVERY DAY 90 capsule 1   traZODone (DESYREL) 50 MG tablet Take 1-2 tablets (50-100 mg total) by mouth at bedtime. 180 tablet 1    vitamin C (ASCORBIC ACID) 500 MG tablet Take 500 mg by mouth daily with lunch.     No current facility-administered medications for this visit.    Allergies  Allergen Reactions   Codeine Hives and Itching    Family History  Problem Relation Age of Onset   Multiple myeloma Mother    Diabetes Mother    Diabetes Sister    Lymphoma Sister    Stroke Neg Hx     Social History   Socioeconomic History   Marital status: Married    Spouse name: Vaughan Basta   Number of children: 3   Years of education: 12   Highest education level: Not on file  Occupational History   Occupation: retired    Comment: retired  Tobacco Use   Smoking status: Every Day    Packs/day: 1.00    Years: 40.00    Additional pack years: 0.00    Total pack years: 40.00    Types: Cigarettes   Smokeless tobacco: Never  Vaping Use   Vaping Use: Never used  Substance and Sexual Activity   Alcohol use: No    Alcohol/week: 0.0 standard drinks of alcohol   Drug use: No   Sexual activity: Not on file  Other Topics Concern   Not on file  Social History Narrative   Patient is right handed   Patient resides with wife,consumes 2-3 cups of caffeine daily   Social Determinants of Health   Financial Resource Strain: Not on file  Food Insecurity: Not on file  Transportation Needs: Not on file  Physical Activity: Not on file  Stress: Not on file  Social Connections: Not on file  Intimate Partner Violence: Not on file     Constitutional: Denies fever, malaise, fatigue, headache or abrupt weight changes.  HEENT: Denies eye pain, eye redness, ear pain, ringing in the ears, wax buildup, runny nose, nasal congestion, bloody nose, or sore throat. Respiratory: Patient reports intermittent shortness of breath.  Denies difficulty breathing, cough or sputum production.   Cardiovascular: Denies chest pain, chest tightness, palpitations or swelling in the hands or feet.  Gastrointestinal: Pt reports abdominal pain,  constipation. Denies abdominal pain, bloating,  diarrhea or blood in the stool.  GU: Patient reports burning with urination.  Denies urgency, frequency, pain with urination, blood in urine, odor or discharge. Musculoskeletal: Patient reports chronic joint pain, difficulty with gait.  Denies decrease in range of motion, muscle pain or joint swelling.  Skin: Denies redness, rashes, lesions or ulcercations.  Neurological: Patient reports insomnia, difficulty with balance,  tremor, intermittent dizziness and some difficulty with memory.  Denies difficulty with speech or problems with coordination.  Psych: Patient reports anxiety.  Denies depression, SI/HI.  No other specific complaints in a complete review of systems (except as listed in HPI above).  Objective:   Physical Exam  BP 136/72 (BP Location: Left Arm, Patient Position: Sitting, Cuff Size: Normal)   Pulse 82   Temp (!) 96.8 F (36 C) (Temporal)   Ht 6' (1.829 m)   Wt 176 lb (79.8 kg)   SpO2 98%   BMI 23.87 kg/m   Wt Readings from Last 3 Encounters:  08/21/22 178 lb (80.7 kg)  05/23/22 192 lb (87.1 kg)  09/20/21 191 lb (86.6 kg)    General: Appears his stated age, chronically ill-appearing, in NAD. Skin: Warm, dry and intact. No ulcerations noted. HEENT: Head: normal shape and size; Eyes: sclera white, no icterus, conjunctiva pink, PERRLA and EOMs intact;  Neck:  Neck supple, trachea midline. No masses, lumps or thyromegaly present.  Cardiovascular: Normal rate and rhythm. S1,S2 noted.  No murmur, rubs or gallops noted. No JVD or BLE edema. No carotid bruits noted. Pulmonary/Chest: Normal effort and positive vesicular breath sounds. No respiratory distress. No wheezes, rales or ronchi noted.  Abdomen: Soft and tender in the epigastric region.  Slightly distended.  Normal bowel sounds. Liver, spleen and kidneys non palpable. Musculoskeletal: Shuffling gait without device. Neurological: Alert and oriented. Coordination normal.   Psychiatric: Mood and affect normal. Behavior is normal. Judgment and thought content normal.    BMET    Component Value Date/Time   NA 140 08/21/2022 1634   NA 141 10/11/2014 1122   K 6.0 (H) 08/21/2022 1634   CL 102 08/21/2022 1634   CO2 30 08/21/2022 1634   GLUCOSE 138 (H) 08/21/2022 1634   BUN 15 08/21/2022 1634   BUN 11 10/11/2014 1122   CREATININE 1.27 08/21/2022 1634   CALCIUM 10.2 08/21/2022 1634   GFRNONAA >60 01/23/2021 0938   GFRAA >60 12/25/2019 0358    Lipid Panel     Component Value Date/Time   CHOL 158 08/21/2022 1634   TRIG 231 (H) 08/21/2022 1634   HDL 43 08/21/2022 1634   CHOLHDL 3.7 08/21/2022 1634   VLDL 75.6 (H) 04/27/2019 1116   LDLCALC 83 08/21/2022 1634    CBC    Component Value Date/Time   WBC 6.0 08/21/2022 1634   RBC 4.20 08/21/2022 1634   HGB 13.7 08/21/2022 1634   HCT 39.8 08/21/2022 1634   PLT 185 08/21/2022 1634   MCV 94.8 08/21/2022 1634   MCH 32.6 08/21/2022 1634   MCHC 34.4 08/21/2022 1634   RDW 11.8 08/21/2022 1634   RDW 13.5 10/11/2014 1122   LYMPHSABS 0.9 12/25/2019 0358   LYMPHSABS 1.3 10/11/2014 1122   MONOABS 0.5 12/25/2019 0358   EOSABS 0.0 12/25/2019 0358   EOSABS 0.0 10/11/2014 1122   BASOSABS 0.0 12/25/2019 0358   BASOSABS 0.0 10/11/2014 1122    Hgb A1C Lab Results  Component Value Date   HGBA1C 6.5 (H) 08/21/2022           Assessment & Plan:   Preventative Health Maintenance:  Encouraged him to get a flu shot the fall He declines tetanus for financial reasons, advised him if he gets better To go get this done Pneumovax and Prevnar 13 UTD Prevnar 20 today Zostavax UTD Discussed Shingrix vaccine, he will check coverage with his insurance company and schedule a visit if he would like to  have this done Encouraged him to get his COVID booster Colon screening UTD Lung cancer screening CT ordered Encouraged him to consume a balanced diet and exercise regimen Advised him to see an eye doctor and  dentist annually We will check c-Met, lipid, A1c, PSA and urine microalbumin  Burning with Urination:  Urinalysis normal although urine looks very concentrated and has a foul odor We will send urine culture Rx for Cipro 500 mg twice daily x 7 days Push fluids  RTC in 6 months, follow-up chronic conditions Webb Silversmith, NP

## 2022-10-18 ENCOUNTER — Encounter: Payer: Self-pay | Admitting: Internal Medicine

## 2022-10-18 LAB — COMPLETE METABOLIC PANEL WITH GFR
AG Ratio: 1.6 (calc) (ref 1.0–2.5)
ALT: 4 U/L — ABNORMAL LOW (ref 9–46)
AST: 8 U/L — ABNORMAL LOW (ref 10–35)
Albumin: 4.3 g/dL (ref 3.6–5.1)
Alkaline phosphatase (APISO): 53 U/L (ref 35–144)
BUN: 21 mg/dL (ref 7–25)
CO2: 28 mmol/L (ref 20–32)
Calcium: 9.8 mg/dL (ref 8.6–10.3)
Chloride: 101 mmol/L (ref 98–110)
Creat: 1.1 mg/dL (ref 0.70–1.28)
Globulin: 2.7 g/dL (calc) (ref 1.9–3.7)
Glucose, Bld: 107 mg/dL (ref 65–139)
Potassium: 5.4 mmol/L — ABNORMAL HIGH (ref 3.5–5.3)
Sodium: 140 mmol/L (ref 135–146)
Total Bilirubin: 0.3 mg/dL (ref 0.2–1.2)
Total Protein: 7 g/dL (ref 6.1–8.1)
eGFR: 68 mL/min/{1.73_m2} (ref >=60)

## 2022-10-18 LAB — URINE CULTURE
MICRO NUMBER:: 14755835
Result:: NO GROWTH
SPECIMEN QUALITY:: ADEQUATE

## 2022-10-18 LAB — LIPID PANEL
Cholesterol: 140 mg/dL (ref <200)
HDL: 36 mg/dL — ABNORMAL LOW (ref >=40)
LDL Cholesterol (Calc): 71 mg/dL (calc)
Non-HDL Cholesterol (Calc): 104 mg/dL (calc) (ref <130)
Total CHOL/HDL Ratio: 3.9 (calc) (ref <5.0)
Triglycerides: 252 mg/dL — ABNORMAL HIGH (ref <150)

## 2022-10-18 LAB — PSA: PSA: 0.69 ng/mL (ref <=4.00)

## 2022-10-18 MED ORDER — ATORVASTATIN CALCIUM 40 MG PO TABS: 40.0000 mg | ORAL_TABLET | Freq: Every day | ORAL | 1 refills | Status: DC

## 2022-10-24 ENCOUNTER — Encounter: Payer: Self-pay | Admitting: Internal Medicine

## 2022-10-28 ENCOUNTER — Other Ambulatory Visit: Payer: Self-pay | Admitting: Internal Medicine

## 2022-10-29 NOTE — Telephone Encounter (Signed)
Requested Prescriptions  Refused Prescriptions Disp Refills   memantine (NAMENDA XR) 14 MG CP24 24 hr capsule [Pharmacy Med Name: MEMANTINE HCL ER 14 MG CAPSULE] 90 capsule 0    Sig: TAKE 1 CAPSULE (14 MG TOTAL) BY MOUTH DAILY.     Neurology:  Alzheimer's Agents 2 Passed - 10/28/2022  4:28 PM      Passed - Cr in normal range and within 360 days    Creat  Date Value Ref Range Status  10/17/2022 1.10 0.70 - 1.28 mg/dL Final   Creatinine,U  Date Value Ref Range Status  03/20/2017 115.3 mg/dL Final   Creatinine, Urine  Date Value Ref Range Status  09/20/2021 209 20 - 320 mg/dL Final         Passed - eGFR is 5 or above and within 360 days    GFR calc Af Amer  Date Value Ref Range Status  12/25/2019 >60 >60 mL/min Final   GFR, Estimated  Date Value Ref Range Status  01/23/2021 >60 >60 mL/min Final    Comment:    (NOTE) Calculated using the CKD-EPI Creatinine Equation (2021)    GFR  Date Value Ref Range Status  01/06/2020 59.40 (L) >60.00 mL/min Final   eGFR  Date Value Ref Range Status  10/17/2022 68 > OR = 60 mL/min/1.39m2 Final         Passed - Valid encounter within last 6 months    Recent Outpatient Visits           1 week ago Encounter for general adult medical examination with abnormal findings   [place] [place], Salvadore Oxford, NP   2 months ago 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[facility])   [place] [facility] [place], Salvadore Oxford, NP   1 year ago Encounter for general adult medical examination with abnormal findings   [place] [place], Salvadore Oxford, NP   1 year ago Aortic atherosclerosis [facility])   [place] [place] [facility], Salvadore Oxford, NP   1 year ago Sacral pain   [place] [facility] [place], Salvadore Oxford, NP       Future Appointments             In 5 months Baity, Salvadore Oxford, NP [place] [facility], [facility]

## 2022-11-03 ENCOUNTER — Other Ambulatory Visit: Payer: Self-pay | Admitting: Internal Medicine

## 2022-11-04 NOTE — Telephone Encounter (Signed)
Requested medication (s) are due for refill today: review  Requested medication (s) are on the active medication list: no  Last refill:  08/21/22  Future visit scheduled: yes  Notes to clinic:  pt had requested to stay on medication in mychart message, review for refill   Requested Prescriptions  Pending Prescriptions Disp Refills   memantine (NAMENDA XR) 14 MG CP24 24 hr capsule [Pharmacy Med Name: MEMANTINE HCL ER 14 MG CAPSULE] 90 capsule 0    Sig: Take 1 capsule (14 mg total) by mouth daily.     Neurology:  Alzheimer's Agents 2 Passed - 11/03/2022 10:34 AM      Passed - Cr in normal range and within 360 days    Creat  Date Value Ref Range Status  10/17/2022 1.10 0.70 - 1.28 mg/dL Final   Creatinine,U  Date Value Ref Range Status  03/20/2017 115.3 mg/dL Final   Creatinine, Urine  Date Value Ref Range Status  09/20/2021 209 20 - 320 mg/dL Final         Passed - eGFR is 5 or above and within 360 days    GFR calc Af Amer  Date Value Ref Range Status  12/25/2019 >60 >60 mL/min Final   GFR, Estimated  Date Value Ref Range Status  01/23/2021 >60 >60 mL/min Final    Comment:    (NOTE) Calculated using the CKD-EPI Creatinine Equation (2021)    GFR  Date Value Ref Range Status  01/06/2020 59.40 (L) >60.00 mL/min Final   eGFR  Date Value Ref Range Status  10/17/2022 68 > OR = 60 mL/min/1.49m2 Final         Passed - Valid encounter within last 6 months    Recent Outpatient Visits           2 weeks ago Encounter for general adult medical examination with abnormal findings   [place] [facility] [place], Salvadore Oxford, NP   2 months ago 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, Salvadore Oxford, NP   1 year ago Sacral pain   [place] [facility] [place], Salvadore Oxford, NP       Future Appointments             In 5 months [place], Salvadore Oxford, NP [place] [facility], [facility]                  Requested Prescriptions  Pending Prescriptions Disp Refills   memantine (NAMENDA XR) 14 MG CP24 24 hr capsule [Pharmacy Med Name: MEMANTINE HCL ER 14 MG CAPSULE] 90 capsule 0    Sig: Take 1 capsule (14 mg total) by mouth daily.     Neurology:  Alzheimer's Agents 2 Passed - 11/03/2022 10:34 AM      Passed - Cr in normal range and within 360 days    Creat  Date Value Ref Range Status  10/17/2022 1.10 0.70 - 1.28 mg/dL Final   Creatinine,U  Date Value Ref Range Status  03/20/2017 115.3 mg/dL Final   Creatinine, Urine  Date Value Ref Range Status  09/20/2021 209 20 - 320 mg/dL Final  Passed - eGFR is 5 or above and within 360 days    GFR calc Af Amer  Date Value Ref Range Status  12/25/2019 >60 >60 mL/min Final   GFR, Estimated  Date Value Ref Range Status  01/23/2021 >60 >60 mL/min Final    Comment:    (NOTE) Calculated using the CKD-EPI Creatinine Equation (2021)    GFR  Date Value Ref Range Status  01/06/2020 59.40 (L) >60.00 mL/min Final   eGFR  Date Value Ref Range Status  10/17/2022 68 > OR = 60 mL/min/1.47m2 Final         Passed - Valid encounter within last 6 months    Recent Outpatient Visits           2 weeks ago Encounter for general adult medical examination with abnormal findings   [place] [facility] [place], Salvadore Oxford, NP   2 months ago 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[facility])   [place] [facility] [place], Salvadore Oxford, NP   1 year ago Encounter for general adult medical examination with abnormal findings   [place] [facility] [place], Salvadore Oxford, NP   1 year ago Aortic atherosclerosis [facility])   [place] [facility] [place], Salvadore Oxford, NP   1 year ago Sacral pain   [place] [facility] [place], Salvadore Oxford, NP       Future Appointments             In 5 months Baity, Salvadore Oxford, NP  [facility], [facility]

## 2022-11-09 ENCOUNTER — Other Ambulatory Visit: Payer: Self-pay | Admitting: Internal Medicine

## 2022-11-09 DIAGNOSIS — G20A1 Parkinson's disease without dyskinesia, without mention of fluctuations: Secondary | ICD-10-CM

## 2022-11-09 DIAGNOSIS — F03918 Unspecified dementia, unspecified severity, with other behavioral disturbance: Secondary | ICD-10-CM

## 2022-11-09 DIAGNOSIS — G4700 Insomnia, unspecified: Secondary | ICD-10-CM

## 2022-11-09 DIAGNOSIS — F32A Depression, unspecified: Secondary | ICD-10-CM

## 2022-11-11 NOTE — Telephone Encounter (Signed)
Requested Prescriptions  Pending Prescriptions Disp Refills   divalproex (DEPAKOTE ER) 500 MG 24 hr tablet [Pharmacy Med Name: DIVALPROEX SODIUM ER 500 MG Tablet Extended Release 24 Hour] 180 tablet 0    Sig: TAKE 2 TABLETS EVERY DAY     Neurology:  Anticonvulsants - Valproates Failed - 11/09/2022  4:05 AM      Failed - AST in normal range and within 360 days    AST  Date Value Ref Range Status  10/17/2022 8 (L) 10 - 35 U/L Final         Failed - ALT in normal range and within 360 days    ALT  Date Value Ref Range Status  10/17/2022 4 (L) 9 - 46 U/L Final         Failed - Valproic Acid (serum) in normal range and within 360 days    Valproic Acid Lvl  Date Value Ref Range Status  12/17/2016 53 50.0 - 100.0 ug/mL Final         Passed - HGB in normal range and within 360 days    Hemoglobin  Date Value Ref Range Status  08/21/2022 13.7 13.2 - 17.1 g/dL Final         Passed - PLT in normal range and within 360 days    Platelets  Date Value Ref Range Status  08/21/2022 185 140 - 400 Thousand/uL Final         Passed - WBC in normal range and within 360 days    WBC  Date Value Ref Range Status  08/21/2022 6.0 3.8 - 10.8 Thousand/uL Final         Passed - HCT in normal range and within 360 days    HCT  Date Value Ref Range Status  08/21/2022 39.8 38.5 - 50.0 % Final         Passed - Completed PHQ-2 or PHQ-9 in the last 360 days      Passed - Patient is not pregnant      Passed - Valid encounter within last 12 months    Recent Outpatient Visits           3 weeks ago Encounter for general adult medical examination with abnormal findings   [place] [facility] [place], Salvadore Oxford, NP   2 months ago 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[facility])   [place] [facility] [place], Salvadore Oxford, NP   1 year ago Encounter for general adult medical examination with abnormal findings   [place] [facility] [place], Salvadore Oxford, NP   1  year ago Aortic atherosclerosis [facility])   [place] [facility] [place], Salvadore Oxford, NP   1 year ago Sacral pain   [place] [facility] [place], Salvadore Oxford, NP       Future Appointments             In 4 months Baity, Salvadore Oxford, NP [place] [facility], [facility]             carbidopa-levodopa (SINEMET IR) 25-100 MG tablet [Pharmacy Med Name: CARBIDOPA/LEVODOPA 25-100 MG Tablet] 270 tablet 0    Sig: TAKE 1 TABLET THREE TIMES DAILY (AT 7AM, 11AM, 4PM)     Neurology:  Parkinsonian Agents 2 Failed - 11/09/2022  4:05 AM      Failed - AST in normal range and within 360 days    AST  Date Value Ref Range Status  10/17/2022 8 (L) 10 - 35 U/L Final         Failed - ALT in normal range and within 360 days    ALT  Date Value Ref Range Status  10/17/2022 4 (L) 9 - 46 U/L Final         Passed - WBC in normal range and within 360 days    WBC  Date Value Ref Range Status  08/21/2022 6.0 3.8 - 10.8 Thousand/uL Final         Passed - PLT in normal range and within 360 days    Platelets  Date Value Ref Range Status  08/21/2022 185 140 - 400 Thousand/uL Final         Passed - HGB in normal range and within 360 days    Hemoglobin  Date Value Ref Range Status  08/21/2022 13.7 13.2 - 17.1 g/dL Final         Passed - HCT in normal range and within 360 days    HCT  Date Value Ref Range Status  08/21/2022 39.8 38.5 - 50.0 % Final         Passed - Cr in normal range and within 360 days    Creat  Date Value Ref Range Status  10/17/2022 1.10 0.70 - 1.28 mg/dL Final   Creatinine,U  Date Value Ref Range Status  03/20/2017 115.3 mg/dL Final   Creatinine, Urine  Date Value Ref Range Status  09/20/2021 209 20 - 320 mg/dL Final         Passed - Last BP in normal range    BP Readings from Last 1 Encounters:  10/17/22 136/72         Passed - Valid encounter within last 12 months    Recent Outpatient Visits           3 weeks ago  Encounter for general adult medical examination with abnormal findings   [place] [facility] [place], Salvadore Oxford, NP   2 months ago 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[facility])   [place] [facility] [place], Salvadore Oxford, NP   1 year ago Encounter for general adult medical examination with abnormal findings   [place] [facility] [place], Salvadore Oxford, NP   1 year ago Aortic atherosclerosis [facility])   [place] [facility] [place], Salvadore Oxford, NP   1 year ago Sacral pain   [place] [facility] [place], Salvadore Oxford, NP       Future Appointments             In 4 months Baity, Salvadore Oxford, NP [place] [facility], [facility]             donepezil (ARICEPT) 10 MG tablet [Pharmacy Med Name: DONEPEZIL HYDROCHLORIDE 10 MG Tablet] 90 tablet 0    Sig: TAKE 1 TABLET EVERY DAY     Neurology:  Alzheimer's Agents Passed - 11/09/2022  4:05 AM      Passed - Valid encounter within last 6 months    Recent Outpatient Visits           3 weeks ago Encounter for general adult medical examination with abnormal findings   [place] [facility] [place], Salvadore Oxford, NP   2 months ago 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[facility])   [place] [facility] [place], Salvadore Oxford, NP   1 year ago Encounter for general adult medical examination with  abnormal findings   [place] [facility] [place], Salvadore Oxford, NP   1 year ago Aortic atherosclerosis [facility])   [place] [facility] [place], Salvadore Oxford, NP   1 year ago Sacral pain   [place] [place], Salvadore Oxford, NP       Future Appointments             In 4 months Baity, Salvadore Oxford, NP [place] [facility], [facility]

## 2022-11-12 ENCOUNTER — Other Ambulatory Visit: Payer: Self-pay | Admitting: Internal Medicine

## 2022-11-12 NOTE — Telephone Encounter (Signed)
Requested Prescriptions  Pending Prescriptions Disp Refills   citalopram (CELEXA) 20 MG tablet [Pharmacy Med Name: CITALOPRAM HYDROBROMIDE 20 MG Tablet] 90 tablet 0    Sig: TAKE 1 TABLET EVERY DAY     Psychiatry:  Antidepressants - SSRI Passed - 11/12/2022 12:24 AM      Passed - Valid encounter within last 6 months    Recent Outpatient Visits           3 weeks ago Encounter for general adult medical examination with abnormal findings   [place] [facility] [place], Salvadore Oxford, NP   2 months ago 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[facility])   [place] [facility] [place], Salvadore Oxford, NP   1 year ago Encounter for general adult medical examination with abnormal findings   [place] [facility] [place], Salvadore Oxford, NP   1 year ago Aortic atherosclerosis [facility])   [place] [facility] [place], Salvadore Oxford, NP   1 year ago Sacral pain   [place] [facility] [place], Salvadore Oxford, NP       Future Appointments             In 4 months Baity, Salvadore Oxford, NP [place] [facility], [facility]

## 2022-11-13 ENCOUNTER — Ambulatory Visit: Payer: Medicare HMO | Admitting: Podiatry

## 2022-11-14 ENCOUNTER — Other Ambulatory Visit: Payer: Self-pay | Admitting: Internal Medicine

## 2022-11-15 ENCOUNTER — Telehealth: Payer: Self-pay | Admitting: Internal Medicine

## 2022-11-15 NOTE — Telephone Encounter (Signed)
Unable to refill per protocol, Rx expired. Discontinued 08/26/22.  Requested Prescriptions  Pending Prescriptions Disp Refills   memantine (NAMENDA XR) 14 MG CP24 24 hr capsule [Pharmacy Med Name: MEMANTINE HCL ER 14 MG CAPSULE] 90 capsule 0    Sig: TAKE 1 CAPSULE (14 MG TOTAL) BY MOUTH DAILY.     Neurology:  Alzheimer's Agents 2 Passed - 11/14/2022  7:54 PM      Passed - Cr in normal range and within 360 days    Creat  Date Value Ref Range Status  10/17/2022 1.10 0.70 - 1.28 mg/dL Final   Creatinine,U  Date Value Ref Range Status  03/20/2017 115.3 mg/dL Final   Creatinine, Urine  Date Value Ref Range Status  09/20/2021 209 20 - 320 mg/dL Final         Passed - eGFR is 5 or above and within 360 days    GFR calc Af Amer  Date Value Ref Range Status  12/25/2019 >60 >60 mL/min Final   GFR, Estimated  Date Value Ref Range Status  01/23/2021 >60 >60 mL/min Final    Comment:    (NOTE) Calculated using the CKD-EPI Creatinine Equation (2021)    GFR  Date Value Ref Range Status  01/06/2020 59.40 (L) >60.00 mL/min Final   eGFR  Date Value Ref Range Status  10/17/2022 68 > OR = 60 mL/min/1.5m2 Final         Passed - Valid encounter within last 6 months    Recent Outpatient Visits           4 weeks ago Encounter for general adult medical examination with abnormal findings   [facility] [place] [facility] [place], Salvadore Oxford, NP   2 months ago 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[facility])   [place] [facility] [place], Salvadore Oxford, NP   1 year ago Encounter for general adult medical examination with abnormal findings   [place] [facility] [place], Salvadore Oxford, NP   1 year ago Aortic atherosclerosis [facility])   [place] [facility] [place], Salvadore Oxford, NP   1 year ago Sacral pain   [place] [facility] [place], Salvadore Oxford, NP       Future Appointments             In 4 months Baity, Salvadore Oxford, NP [place]  [facility], [facility]

## 2022-11-15 NOTE — Telephone Encounter (Signed)
Copied from CRM [id number]. Topic: Medicare AWV >> Nov 15, 2022  2:42 PM Payton Doughty wrote: Reason for CRM: Called patient to schedule Medicare Annual Wellness Visit (AWV). Left message for patient to call back and schedule Medicare Annual Wellness Visit (AWV).  Last date of AWV: 11/23/21  Please schedule an appointment at any time with Kennedy Bucker, LPN  .  If any questions, please contact me.  Thank you ,  Verlee Rossetti; Care Guide Ambulatory Clinical Support [place] [facility] Direct Dial: [phone]

## 2022-11-16 ENCOUNTER — Other Ambulatory Visit: Payer: Self-pay | Admitting: Internal Medicine

## 2022-11-18 NOTE — Telephone Encounter (Signed)
Requested Prescriptions  Pending Prescriptions Disp Refills   traZODone (DESYREL) 50 MG tablet [Pharmacy Med Name: TRAZODONE HYDROCHLORIDE 50 MG Tablet] 180 tablet 1    Sig: TAKE 1 TO 2 TABLETS AT BEDTIME     Psychiatry: Antidepressants - Serotonin Modulator Passed - 11/16/2022  2:34 AM      Passed - Valid encounter within last 6 months    Recent Outpatient Visits           1 month ago Encounter for general adult medical examination with abnormal findings   [place] [facility] [place], Salvadore Oxford, NP   2 months ago 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[facility])   [place] [facility] [place], Salvadore Oxford, NP   1 year ago Encounter for general adult medical examination with abnormal findings   [place] [facility] [place], Salvadore Oxford, NP   1 year ago Aortic atherosclerosis [facility])   [place] [facility] [place], Salvadore Oxford, NP   1 year ago Sacral pain   [place] [facility] [place], Salvadore Oxford, NP       Future Appointments             In 4 months Baity, Salvadore Oxford, NP [place] [facility], [facility]

## 2022-11-20 DIAGNOSIS — N481 Balanitis: Secondary | ICD-10-CM | POA: Diagnosis not present

## 2022-11-20 DIAGNOSIS — N390 Urinary tract infection, site not specified: Secondary | ICD-10-CM | POA: Diagnosis not present

## 2022-11-22 ENCOUNTER — Other Ambulatory Visit: Payer: Self-pay | Admitting: Internal Medicine

## 2022-11-22 NOTE — Telephone Encounter (Signed)
Requested medication (s) are due for refill today: yes  Requested medication (s) are on the active medication list: yes  Last refill:  10/10/22  Future visit scheduled: yes  Notes to clinic:  Unable to refill per protocol, cannot delegate.      Requested Prescriptions  Pending Prescriptions Disp Refills   LORazepam (ATIVAN) 0.5 MG tablet [Pharmacy Med Name: LORAZEPAM 0.5 MG TABLET] 45 tablet 0    Sig: TAKE 1 TO 2 TABLETS BY MOUTH TWICE A DAY AS NEEDED     Not Delegated - Psychiatry: Anxiolytics/Hypnotics 2 Failed - 11/22/2022  9:51 AM      Failed - This refill cannot be delegated      Failed - Urine Drug Screen completed in last 360 days      Passed - Patient is not pregnant      Passed - Valid encounter within last 6 months    Recent Outpatient Visits           1 month ago Encounter for general adult medical examination with abnormal findings   [place] [facility] [place], Salvadore Oxford, NP   3 months ago 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[facility])   [facility] [place], Salvadore Oxford, NP   1 year ago Encounter for general adult medical examination with abnormal findings   [place] [facility] [place], Salvadore Oxford, NP   1 year ago Aortic atherosclerosis [facility])   [place] [facility] [place], Salvadore Oxford, NP   1 year ago Sacral pain   [place] [facility] [place], Salvadore Oxford, NP       Future Appointments             In 4 months Baity, Salvadore Oxford, NP [place] [facility], [facility]

## 2022-11-25 ENCOUNTER — Ambulatory Visit
Admission: RE | Admit: 2022-11-25 | Discharge: 2022-11-25 | Disposition: A | Discharge status: Home / Self Care | Payer: Medicare HMO | Source: Ambulatory Visit | Attending: Internal Medicine | Admitting: Internal Medicine

## 2022-11-25 DIAGNOSIS — I700 Atherosclerosis of aorta: Secondary | ICD-10-CM | POA: Diagnosis not present

## 2022-11-25 DIAGNOSIS — Z122 Encounter for screening for malignant neoplasm of respiratory organs: Secondary | ICD-10-CM | POA: Diagnosis not present

## 2022-11-25 DIAGNOSIS — Z136 Encounter for screening for cardiovascular disorders: Secondary | ICD-10-CM | POA: Diagnosis not present

## 2022-11-25 DIAGNOSIS — F17210 Nicotine dependence, cigarettes, uncomplicated: Secondary | ICD-10-CM | POA: Diagnosis not present

## 2022-11-25 DIAGNOSIS — J439 Emphysema, unspecified: Secondary | ICD-10-CM | POA: Diagnosis not present

## 2022-11-25 DIAGNOSIS — F17200 Nicotine dependence, unspecified, uncomplicated: Secondary | ICD-10-CM | POA: Diagnosis not present

## 2022-12-23 ENCOUNTER — Other Ambulatory Visit: Payer: Self-pay | Admitting: Internal Medicine

## 2022-12-24 NOTE — Telephone Encounter (Signed)
Requested Prescriptions  Pending Prescriptions Disp Refills   mirtazapine (REMERON) 45 MG tablet [Pharmacy Med Name: MIRTAZAPINE 45 MG Tablet] 90 tablet 1    Sig: TAKE 1 TABLET AT BEDTIME     Psychiatry: Antidepressants - mirtazapine Passed - 12/23/2022  3:13 AM      Passed - Valid encounter within last 6 months    Recent Outpatient Visits           2 months ago Encounter for general adult medical examination with abnormal findings   [place] [facility] [place], Salvadore Oxford, NP   4 months ago 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[facility])   [place] [place] [facility] [place], Salvadore Oxford, NP   1 year ago Encounter for general adult medical examination with abnormal findings   [place] [facility] [place], Salvadore Oxford, NP   1 year ago Aortic atherosclerosis [facility])   [place] [facility] [place], Salvadore Oxford, NP   1 year ago Sacral pain   [place] [facility] [place], Salvadore Oxford, NP       Future Appointments             In 3 months [place], Salvadore Oxford, NP [place] [facility]             metFORMIN (GLUCOPHAGE) 500 MG tablet [Pharmacy Med Name: METFORMIN HYDROCHLORIDE 500 MG Tablet] 180 tablet 1    Sig: TAKE 1 TABLET TWICE DAILY WITH MEALS     Endocrinology:  Diabetes - Biguanides Failed - 12/23/2022  3:13 AM      Failed - B12 Level in normal range and within 720 days    Vitamin B-12  Date Value Ref Range Status  12/21/2019 325 180 - 914 pg/mL Final    Comment:    (NOTE) This assay is not validated for testing neonatal or myeloproliferative syndrome specimens for Vitamin B12 levels. Performed at [facility], [address] [place], [place] [address]          Failed - CBC within normal limits and completed in the last 12 months    WBC  Date Value Ref Range Status  08/21/2022 6.0 3.8 - 10.8 Thousand/uL Final   RBC  Date Value Ref Range Status  08/21/2022 4.20 4.20 - 5.80  Million/uL Final   Hemoglobin  Date Value Ref Range Status  08/21/2022 13.7 13.2 - 17.1 g/dL Final   HCT  Date Value Ref Range Status  08/21/2022 39.8 38.5 - 50.0 % Final   MCHC  Date Value Ref Range Status  08/21/2022 34.4 32.0 - 36.0 g/dL Final   [facility]  Date Value Ref Range Status  08/21/2022 32.6 27.0 - 33.0 pg Final   MCV  Date Value Ref Range Status  08/21/2022 94.8 80.0 - 100.0 fL Final   No results found for: "PLTCOUNTKUC", "LABPLAT", "POCPLA" RDW  Date Value Ref Range Status  08/21/2022 11.8 11.0 - 15.0 % Final  10/11/2014 13.5 12.3 - 15.4 % Final         Passed - Cr in normal range and within 360 days    Creat  Date Value Ref Range Status  10/17/2022 1.10 0.70 - 1.28 mg/dL Final   Creatinine,U  Date Value Ref Range Status  03/20/2017 115.3 mg/dL Final   Creatinine, Urine  Date Value Ref Range Status  09/20/2021 209 20 - 320 mg/dL Final         Passed -  HBA1C is between 0 and 7.9 and within 180 days    Hgb A1c MFr Bld  Date Value Ref Range Status  08/21/2022 6.5 (H) <5.7 % of total Hgb Final    Comment:    For someone without known diabetes, a hemoglobin A1c value of 6.5% or greater indicates that they may have  diabetes and this should be confirmed with a follow-up  test. . For someone with known diabetes, a value <7% indicates  that their diabetes is well controlled and a value  greater than or equal to 7% indicates suboptimal  control. A1c targets should be individualized based on  duration of diabetes, age, comorbid conditions, and  other considerations. . Currently, no consensus exists regarding use of hemoglobin A1c for diagnosis of diabetes for children. Verna Czech - eGFR in normal range and within 360 days    GFR calc Af Amer  Date Value Ref Range Status  12/25/2019 >60 >60 mL/min Final   GFR, Estimated  Date Value Ref Range Status  01/23/2021 >60 >60 mL/min Final    Comment:    (NOTE) Calculated using the CKD-EPI  Creatinine Equation (2021)    GFR  Date Value Ref Range Status  01/06/2020 59.40 (L) >60.00 mL/min Final   eGFR  Date Value Ref Range Status  10/17/2022 68 > OR = 60 mL/min/1.65m2 Final         Passed - Valid encounter within last 6 months    Recent Outpatient Visits           2 months ago Encounter for general adult medical examination with abnormal findings   [place] [facility] [place], Salvadore Oxford, NP   4 months ago 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[facility])   [place] [facility] [place], Salvadore Oxford, NP   1 year ago Encounter for general adult medical examination with abnormal findings   [place] [facility] [place], Salvadore Oxford, NP   1 year ago Aortic atherosclerosis [facility])   [place] [facility] [place], Salvadore Oxford, NP   1 year ago Sacral pain   [place] [facility] [place], Salvadore Oxford, NP       Future Appointments             In 3 months [place], Salvadore Oxford, NP [place] [facility]

## 2022-12-31 DIAGNOSIS — G20A1 Parkinson's disease without dyskinesia, without mention of fluctuations: Secondary | ICD-10-CM | POA: Diagnosis not present

## 2022-12-31 DIAGNOSIS — M5416 Radiculopathy, lumbar region: Secondary | ICD-10-CM | POA: Diagnosis not present

## 2022-12-31 DIAGNOSIS — F1120 Opioid dependence, uncomplicated: Secondary | ICD-10-CM | POA: Diagnosis not present

## 2022-12-31 DIAGNOSIS — R296 Repeated falls: Secondary | ICD-10-CM | POA: Diagnosis not present

## 2023-01-09 ENCOUNTER — Ambulatory Visit (INDEPENDENT_AMBULATORY_CARE_PROVIDER_SITE_OTHER): Payer: Medicare HMO

## 2023-01-09 VITALS — Ht 72.0 in | Wt 175.0 lb

## 2023-01-09 DIAGNOSIS — Z0000 Encounter for general adult medical examination without abnormal findings: Secondary | ICD-10-CM

## 2023-01-09 NOTE — Progress Notes (Signed)
Subjective:   Alexander Booth is a 77 y.o. male who presents for Medicare Annual/Subsequent preventive examination. Daughter Alexander Booth was also on the call.  Visit Complete: Virtual  I connected with  Alexander Booth on 01/09/23 by a audio enabled telemedicine application and verified that I am speaking with the correct person using two identifiers.  Patient Location: Home  Provider Location: Office/Clinic  I discussed the limitations of evaluation and management by telemedicine. The patient expressed understanding and agreed to proceed.  Patient Medicare AWV questionnaire was completed by the patient on 01/09/2023; I have confirmed that all information answered by patient is correct and no changes since this date.  Review of Systems     Cardiac Risk Factors include: advanced age (>39men, >81 women);diabetes mellitus;hypertension;male gender;smoking/ tobacco exposure     Objective:    Today's Vitals   01/09/23 1127 01/09/23 1128  Weight: 175 lb (79.4 kg)   Height: 6' (1.829 m)   PainSc:  7    Body mass index is 23.73 kg/m.     01/09/2023   11:35 AM 04/03/2021    3:00 PM 01/26/2021    8:00 AM 01/25/2021   12:13 PM 01/23/2021    9:22 AM 10/19/2020    1:50 PM 10/19/2020    9:40 AM  Advanced Directives  Does Patient Have a Medical Advance Directive? Yes Yes Yes Yes Yes Yes Yes  Type of Estate agent of [place];Living will Living will Healthcare Power of [place];Living will  Healthcare Power of [place];Living will Living will Living will  Does patient want to make changes to medical advance directive?   No - Patient declined No - Patient declined  No - Patient declined   Copy of Healthcare Power of Attorney in Chart? Yes - validated most recent copy scanned in chart (See row information)          Current Medications (verified) Outpatient Encounter Medications as of 01/09/2023  Medication Sig   Accu-Chek Softclix Lancets lancets Check sugar 2 times daily.  DX E11.8   acetaminophen (TYLENOL) 500 MG tablet Take 1,000 mg by mouth every 6 (six) hours as needed for moderate pain.   Albuterol Sulfate (PROAIR RESPICLICK) 108 (90 Base) MCG/ACT AEPB Inhale 1-2 puffs into the lungs every 4 (four) hours as needed. (Patient taking differently: Inhale 1-2 puffs into the lungs every 4 (four) hours as needed (shortness of breath).)   aspirin EC 81 MG tablet Take 81 mg by mouth daily with breakfast.   atorvastatin (LIPITOR) 40 MG tablet Take 1 tablet (40 mg total) by mouth daily.   Blood Glucose Monitoring Suppl (ACCU-CHEK AVIVA PLUS) w/Device KIT Check sugar 2 times daily. DX E11.8   BREO ELLIPTA 100-25 MCG/INH AEPB INHALE 1 PUFF BY MOUTH EVERY DAY   carbidopa-levodopa (SINEMET IR) 25-100 MG tablet TAKE 1 TABLET THREE TIMES DAILY (AT 7AM, 11AM, 4PM)   Cholecalciferol (QC VITAMIN D3) 50 MCG (2000 UT) TABS Take 2,000 Units by mouth daily with lunch.   citalopram (CELEXA) 20 MG tablet TAKE 1 TABLET EVERY DAY   cyclobenzaprine (FLEXERIL) 5 MG tablet Take 5 mg by mouth 2 (two) times daily.   divalproex (DEPAKOTE ER) 500 MG 24 hr tablet TAKE 2 TABLETS EVERY DAY   donepezil (ARICEPT) 10 MG tablet TAKE 1 TABLET EVERY DAY   gabapentin (NEURONTIN) 400 MG capsule TAKE 3 CAPSULES AT BEDTIME AND MAY REPEAT DOSE ONE TIME IF NEEDED   glucose blood (ACCU-CHEK AVIVA PLUS) test strip Check sugar 2 times  daily. DX E11.8   glucose blood (ACCU-CHEK GUIDE) test strip Use as instructed   lisinopril (ZESTRIL) 5 MG tablet TAKE 1 TABLET (5 MG TOTAL) BY MOUTH DAILY.   LORazepam (ATIVAN) 0.5 MG tablet TAKE 1 TO 2 TABLETS BY MOUTH TWICE A DAY AS NEEDED   meloxicam (MOBIC) 15 MG tablet TAKE 1 TABLET EVERY DAY WITH SUPPER   metFORMIN (GLUCOPHAGE) 500 MG tablet TAKE 1 TABLET TWICE DAILY WITH MEALS   mirtazapine (REMERON) 45 MG tablet TAKE 1 TABLET AT BEDTIME   nitroGLYCERIN (NITROSTAT) 0.4 MG SL tablet Place 0.4 mg under the tongue every 5 (five) minutes x 3 doses as needed for chest pain.    pantoprazole (PROTONIX) 20 MG tablet Take 1 tablet (20 mg total) by mouth daily.   tamsulosin (FLOMAX) 0.4 MG CAPS capsule TAKE 1 CAPSULE BY MOUTH EVERY DAY   traZODone (DESYREL) 50 MG tablet TAKE 1 TO 2 TABLETS AT BEDTIME   vitamin C (ASCORBIC ACID) 500 MG tablet Take 500 mg by mouth daily with lunch.   ciprofloxacin (CIPRO) 500 MG tablet Take 1 tablet (500 mg total) by mouth 2 (two) times daily. One po bid x 7 days (Patient not taking: Reported on 01/09/2023)   gentamicin cream (GARAMYCIN) 0.1 % Apply 1 Application topically 2 (two) times daily. (Patient not taking: Reported on 01/09/2023)   [DISCONTINUED] sildenafil (VIAGRA) 50 MG tablet Take 1 tablet (50 mg total) by mouth as needed.   No facility-administered encounter medications on file as of 01/09/2023.    Allergies (verified) Codeine   History: Past Medical History:  Diagnosis Date   Arthritis    COPD (chronic obstructive pulmonary disease) (HCC)    Depression    Dyspnea    Erectile dysfunction    He's had a hx of    Hypercholesterolemia    Hypertension    Essential   Lumbosacral disc disease    with chronic low back pain, has implanted nerve stimulator   Peripheral neuropathy    Presenile dementia    Type 2 diabetes mellitus (HCC) 2010   takes oral meds   Past Surgical History:  Procedure Laterality Date   ANTERIOR CERVICAL DECOMP/DISCECTOMY FUSION     APPENDECTOMY     HEMORRHOID SURGERY     x2   JOINT REPLACEMENT Left    2nd one was to change siZe   KNEE SURGERY     Previous left total replacement   KYPHOPLASTY N/A 12/10/2012   Procedure: Lumbar one Kyphoplasty;  Surgeon: Barnett Abu, MD;  Location: [facility];  Service: Neurosurgery;  Laterality: N/A;  Lumbar One Kyphoplasty   LUMBAR LAMINECTOMY/DECOMPRESSION MICRODISCECTOMY Bilateral 01/25/2021   Procedure: Bilateral Lumbar 4-5, Lumbar 5 Sacral 1 Laminectomy/Foraminotomy;  Surgeon: Maeola Harman, MD;  Location: [facility] OR;  Service: Neurosurgery;  Laterality:  Bilateral;  3C/RM 21   OTHER SURGICAL HISTORY  1992   Bone Fusion in Right Foot   SPINAL CORD STIMULATOR INSERTION N/A 12/02/2014   Procedure: THORACIC SPINAL CORD STIMULATOR INSERTION INFINION [employer] 16 LEAD WITH LAMINECTOMY;  Surgeon: Maeola Harman, MD;  Location: [facility];  Service: Neurosurgery;  Laterality: N/A;  THORACIC SPINAL CORD STIMULATOR INSERTION INFINION [employer] 16 LEAD WITH LAMINECTOMY   SPINAL CORD STIMULATOR REMOVAL N/A 10/19/2020   Procedure: Removal of spinal cord stimulator with paddle lead;  Surgeon: Maeola Harman, MD;  Location: [facility] OR;  Service: Neurosurgery;  Laterality: N/A;   TOTAL KNEE ARTHROPLASTY Left    Family History  Problem Relation Age of  Onset   Multiple myeloma Mother    Diabetes Mother    Diabetes Sister    Lymphoma Sister    Stroke Neg Hx    Social History   Socioeconomic History   Marital status: Married    Spouse name: Bonita Quin   Number of children: 3   Years of education: 12   Highest education level: Not on file  Occupational History   Occupation: retired    Comment: retired  Tobacco Use   Smoking status: Every Day    Packs/day: 1.00    Years: 40.00    Additional pack years: 0.00    Total pack years: 40.00    Types: Cigarettes   Smokeless tobacco: Never  Vaping Use   Vaping Use: Never used  Substance and Sexual Activity   Alcohol use: No    Alcohol/week: 0.0 standard drinks of alcohol   Drug use: No   Sexual activity: Not on file  Other Topics Concern   Not on file  Social History Narrative   Patient is right handed   Patient resides with wife,consumes 2-3 cups of caffeine daily   Social Determinants of Health   Financial Resource Strain: Low Risk  (01/09/2023)   Overall Financial Resource Strain (CARDIA)    Difficulty of Paying Living Expenses: Not hard at all  Food Insecurity: No Food Insecurity (01/09/2023)   Hunger Vital Sign    Worried About Running Out of Food in the Last Year: Never true    Ran  Out of Food in the Last Year: Never true  Transportation Needs: No Transportation Needs (01/09/2023)   PRAPARE - Administrator, Civil Service (Medical): No    Lack of Transportation (Non-Medical): No  Physical Activity: Inactive (01/09/2023)   Exercise Vital Sign    Days of Exercise per Week: 0 days    Minutes of Exercise per Session: 0 min  Stress: No Stress Concern Present (01/09/2023)   [employer] of Occupational Health - Occupational Stress Questionnaire    Feeling of Stress : Not at all  Social Connections: Unknown (01/09/2023)   Social Connection and Isolation Panel [NHANES]    Frequency of Communication with Friends and Family: More than three times a week    Frequency of Social Gatherings with Friends and Family: Three times a week    Attends Religious Services: Not on file    Active Member of Clubs or Organizations: No    Attends Banker Meetings: Never    Marital Status: Married    Tobacco Counseling Ready to quit: No Counseling given: Not Answered   Clinical Intake:  Pre-visit preparation completed: Yes  Pain : 0-10 Pain Score: 7  Pain Type: Chronic pain Pain Location: Back Pain Orientation: Lower Pain Radiating Towards: legs Pain Descriptors / Indicators: Aching Pain Onset: More than a month ago Pain Frequency: Constant     Nutritional Status: BMI of 19-24  Normal Nutritional Risks: None Diabetes: Yes CBG done?: No Did pt. bring in CBG monitor from home?: No  How often do you need to have someone help you when you read instructions, pamphlets, or other written materials from your doctor or pharmacy?: 3 - Sometimes  Interpreter Needed?: No  Information entered by :: NAllen LPN   Activities of Daily Living    01/09/2023    9:15 AM 10/17/2022   10:21 AM  In your present state of health, do you have any difficulty performing the following activities:  Hearing? 1 1  Comment no  hearing aids   Vision? 0 1  Difficulty  concentrating or making decisions? 1 1  Walking or climbing stairs? 1 1  Dressing or bathing? 0 0  Doing errands, shopping? 1 1  Preparing Food and eating ? N   Using the Toilet? N   In the past six months, have you accidently leaked urine? Y   Do you have problems with loss of bowel control? N   Managing your Medications? Y   Managing your Finances? Y   Housekeeping or managing your Housekeeping? Y     Patient Care Team: Lorre Munroe, NP as PCP - General (Internal Medicine)  Indicate any recent Medical Services you may have received from other than [facility] providers in the past year (date may be approximate).     Assessment:   This is a routine wellness examination for Abdulmohsen.  Hearing/Vision screen Vision Screening - Comments:: Regular eye exams, [facility]  Dietary issues and exercise activities discussed:     Goals Addressed             This Visit's Progress    Patient Stated       01/09/2023, keep comfortable       Depression Screen    01/09/2023   11:36 AM 10/17/2022   10:20 AM 08/21/2022    9:04 AM 12/03/2021    9:58 AM 09/20/2021    9:52 AM 06/22/2021    2:48 PM 10/27/2020    2:16 PM  PHQ 2/9 Scores  PHQ - 2 Score 0 3 0 0 0 1 0  PHQ- 9 Score  11   3 6 4     Fall Risk    01/09/2023    9:15 AM 10/17/2022   10:21 AM 08/21/2022    9:04 AM 12/03/2021    9:58 AM 04/03/2021    3:00 PM  Fall Risk   Falls in the past year? 1 1 0 1 1  Number falls in past yr: 1 1  1 1   Injury with Fall? 1 0 0 1 0  Risk for fall due to : Impaired mobility;Impaired balance/gait;History of fall(s);Medication side effect Impaired balance/gait Impaired balance/gait Impaired vision;Impaired mobility;Impaired balance/gait   Follow up Falls prevention discussed;Education provided;Falls evaluation completed   Falls evaluation completed     MEDICARE RISK AT HOME:  Medicare Risk at Home - 01/09/23 0915     If so, are there any without handrails? No             TIMED UP AND  GO:  Was the test performed?  No    Cognitive Function:  6 CIT not administered. Patient has a diagnosis of dementia.     10/10/2015    9:57 AM 04/13/2015   11:31 AM 10/11/2014   10:12 AM 04/11/2014   10:03 AM  MMSE - Mini Mental State Exam  Orientation to time 4 4 4 4   Orientation to Place 5 4 4 3   Registration 0 3 3 3   Attention/ Calculation 0 4 4 5   Recall 0 1 2 2   Language- name 2 objects 2 2 2 2   Language- repeat 1 1 1 1   Language- follow 3 step command 3 3 3 3   Language- read & follow direction 1 1 1 1   Write a sentence 1 1 1 1   Copy design 1 1 1 1   Total score 18 25 26 26         12/03/2021    9:56 AM  6CIT Screen  What Year? 0 points  What month? 3 points  What time? 0 points  Count back from 20 0 points  Months in reverse 2 points  Repeat phrase 2 points  Total Score 7 points    Immunizations Immunization History  Administered Date(s) Administered   Fluad Quad(high Dose 65+) 04/12/2019, 04/26/2021   Influenza, High Dose Seasonal PF 03/27/2017   Influenza,inj,Quad PF,6+ Mos 04/25/2015, 04/21/2018   Influenza-Unspecified 05/22/2013, 03/22/2014   PFIZER(Purple Top)SARS-COV-2 Vaccination 09/22/2019, 10/13/2019, 05/19/2020   PNEUMOCOCCAL CONJUGATE-20 10/17/2022   Pfizer Covid-19 Vaccine Bivalent Booster 94yrs & up 07/31/2021   Pneumococcal Conjugate-13 10/19/2015   Pneumococcal Polysaccharide-23 04/21/2018   Pneumococcal-Unspecified 08/22/2012   Td 08/22/2012   Zoster, Live 09/19/2012    TDAP status: Due, Education has been provided regarding the importance of this vaccine. Advised may receive this vaccine at local pharmacy or Health Dept. Aware to provide a copy of the vaccination record if obtained from local pharmacy or Health Dept. Verbalized acceptance and understanding.  Flu Vaccine status: Up to date  Pneumococcal vaccine status: Up to date  Covid-19 vaccine status: Completed vaccines  Qualifies for Shingles Vaccine? Yes   Zostavax completed  Yes   Shingrix Completed?: No.    Education has been provided regarding the importance of this vaccine. Patient has been advised to call insurance company to determine out of pocket expense if they have not yet received this vaccine. Advised may also receive vaccine at local pharmacy or Health Dept. Verbalized acceptance and understanding.  Screening Tests Health Maintenance  Topic Date Due   Zoster Vaccines- Shingrix (1 of 2) Never done   COVID-19 Vaccine (5 - 2023-24 season) 03/22/2022   DTaP/Tdap/Td (2 - Tdap) 08/22/2022   Diabetic kidney evaluation - Urine ACR  09/21/2022   Medicare Annual Wellness (AWV)  12/04/2022   HEMOGLOBIN A1C  02/19/2023   INFLUENZA VACCINE  02/20/2023   OPHTHALMOLOGY EXAM  05/04/2023   Diabetic kidney evaluation - eGFR measurement  10/17/2023   FOOT EXAM  10/17/2023   Lung Cancer Screening  11/25/2023   Pneumonia Vaccine 58+ Years old  Completed   Hepatitis C Screening  Completed   HPV VACCINES  Aged Out   Colonoscopy  Discontinued    Health Maintenance  Health Maintenance Due  Topic Date Due   Zoster Vaccines- Shingrix (1 of 2) Never done   COVID-19 Vaccine (5 - 2023-24 season) 03/22/2022   DTaP/Tdap/Td (2 - Tdap) 08/22/2022   Diabetic kidney evaluation - Urine ACR  09/21/2022   Medicare Annual Wellness (AWV)  12/04/2022    Colorectal cancer screening: No longer required.   Lung Cancer Screening: (Low Dose CT Chest recommended if Age 100-80 years, 20 pack-year currently smoking OR have quit w/in 15years.) does not qualify.   Lung Cancer Screening Referral: no  Additional Screening:  Hepatitis C Screening: does qualify; Completed 12/24/2019  Vision Screening: Recommended annual ophthalmology exams for early detection of glaucoma and other disorders of the eye. Is the patient up to date with their annual eye exam?  Yes  Who is the provider or what is the name of the office in which the patient attends annual eye exams? [facility] If pt is  not established with a provider, would they like to be referred to a provider to establish care? No .   Dental Screening: Recommended annual dental exams for proper oral hygiene  Diabetic Foot Exam: Diabetic Foot Exam: Completed 10/17/2022  Community Resource Referral / Chronic Care Management: CRR required this visit?  No   CCM required this visit?  No     Plan:     I have personally reviewed and noted the following in the patient's chart:   Medical and social history Use of alcohol, tobacco or illicit drugs  Current medications and supplements including opioid prescriptions. Patient is not currently taking opioid prescriptions. Functional ability and status Nutritional status Physical activity Advanced directives List of other physicians Hospitalizations, surgeries, and ER visits in previous 12 months Vitals Screenings to include cognitive, depression, and falls Referrals and appointments  In addition, I have reviewed and discussed with patient certain preventive protocols, quality metrics, and best practice recommendations. A written personalized care plan for preventive services as well as general preventive health recommendations were provided to patient.     Barb Merino, LPN   1/61/0960   After Visit Summary: (MyChart) Due to this being a telephonic visit, the after visit summary with patients personalized plan was offered to patient via MyChart   Nurse Notes: none

## 2023-01-09 NOTE — Patient Instructions (Signed)
Alexander Booth , Thank you for taking time to come for your Medicare Wellness Visit. I appreciate your ongoing commitment to your health goals. Please review the following plan we discussed and let me know if I can assist you in the future.   These are the goals we discussed:  Goals      Patient Stated     01/09/2023, keep comfortable        This is a list of the screening recommended for you and due dates:  Health Maintenance  Topic Date Due   Zoster (Shingles) Vaccine (1 of 2) Never done   COVID-19 Vaccine (5 - 2023-24 season) 03/22/2022   DTaP/Tdap/Td vaccine (2 - Tdap) 08/22/2022   Yearly kidney health urinalysis for diabetes  09/21/2022   Hemoglobin A1C  02/19/2023   Flu Shot  02/20/2023   Eye exam for diabetics  05/04/2023   Yearly kidney function blood test for diabetes  10/17/2023   Complete foot exam   10/17/2023   Screening for Lung Cancer  11/25/2023   Medicare Annual Wellness Visit  01/09/2024   Pneumonia Vaccine  Completed   Hepatitis C Screening  Completed   HPV Vaccine  Aged Out   Colon Cancer Screening  Discontinued    Advanced directives: copy in chart  Conditions/risks identified: smoking  Next appointment: Follow up in one year for your annual wellness visit.   Preventive Care 29 Years and Older, Male  Preventive care refers to lifestyle choices and visits with your health care provider that can promote health and wellness. What does preventive care include? A yearly physical exam. This is also called an annual well check. Dental exams once or twice a year. Routine eye exams. Ask your health care provider how often you should have your eyes checked. Personal lifestyle choices, including: Daily care of your teeth and gums. Regular physical activity. Eating a healthy diet. Avoiding tobacco and drug use. Limiting alcohol use. Practicing safe sex. Taking low doses of aspirin every day. Taking vitamin and mineral supplements as recommended by your  health care provider. What happens during an annual well check? The services and screenings done by your health care provider during your annual well check will depend on your age, overall health, lifestyle risk factors, and family history of disease. Counseling  Your health care provider may ask you questions about your: Alcohol use. Tobacco use. Drug use. Emotional well-being. Home and relationship well-being. Sexual activity. Eating habits. History of falls. Memory and ability to understand (cognition). Work and work Astronomer. Screening  You may have the following tests or measurements: Height, weight, and BMI. Blood pressure. Lipid and cholesterol levels. These may be checked every 5 years, or more frequently if you are over 31 years old. Skin check. Lung cancer screening. You may have this screening every year starting at age 44 if you have a 30-pack-year history of smoking and currently smoke or have quit within the past 15 years. Fecal occult blood test (FOBT) of the stool. You may have this test every year starting at age 55. Flexible sigmoidoscopy or colonoscopy. You may have a sigmoidoscopy every 5 years or a colonoscopy every 10 years starting at age 27. Prostate cancer screening. Recommendations will vary depending on your family history and other risks. Hepatitis C blood test. Hepatitis B blood test. Sexually transmitted disease (STD) testing. Diabetes screening. This is done by checking your blood sugar (glucose) after you have not eaten for a while (fasting). You may have this done every  1-3 years. Abdominal aortic aneurysm (AAA) screening. You may need this if you are a current or former smoker. Osteoporosis. You may be screened starting at age 64 if you are at high risk. Talk with your health care provider about your test results, treatment options, and if necessary, the need for more tests. Vaccines  Your health care provider may recommend certain vaccines, such  as: Influenza vaccine. This is recommended every year. Tetanus, diphtheria, and acellular pertussis (Tdap, Td) vaccine. You may need a Td booster every 10 years. Zoster vaccine. You may need this after age 5. Pneumococcal 13-valent conjugate (PCV13) vaccine. One dose is recommended after age 60. Pneumococcal polysaccharide (PPSV23) vaccine. One dose is recommended after age 78. Talk to your health care provider about which screenings and vaccines you need and how often you need them. This information is not intended to replace advice given to you by your health care provider. Make sure you discuss any questions you have with your health care provider. Document Released: 08/04/2015 Document Revised: 03/27/2016 Document Reviewed: 05/09/2015 Elsevier Interactive Patient Education  2017 [employer].  Fall Prevention in the Home Falls can cause injuries. They can happen to people of all ages. There are many things you can do to make your home safe and to help prevent falls. What can I do on the outside of my home? Regularly fix the edges of walkways and driveways and fix any cracks. Remove anything that might make you trip as you walk through a door, such as a raised step or threshold. Trim any bushes or trees on the path to your home. Use bright outdoor lighting. Clear any walking paths of anything that might make someone trip, such as rocks or tools. Regularly check to see if handrails are loose or broken. Make sure that both sides of any steps have handrails. Any raised decks and porches should have guardrails on the edges. Have any leaves, snow, or ice cleared regularly. Use sand or salt on walking paths during winter. Clean up any spills in your garage right away. This includes oil or grease spills. What can I do in the bathroom? Use night lights. Install grab bars by the toilet and in the tub and shower. Do not use towel bars as grab bars. Use non-skid mats or decals in the tub or  shower. If you need to sit down in the shower, use a plastic, non-slip stool. Keep the floor dry. Clean up any water that spills on the floor as soon as it happens. Remove soap buildup in the tub or shower regularly. Attach bath mats securely with double-sided non-slip rug tape. Do not have throw rugs and other things on the floor that can make you trip. What can I do in the bedroom? Use night lights. Make sure that you have a light by your bed that is easy to reach. Do not use any sheets or blankets that are too big for your bed. They should not hang down onto the floor. Have a firm chair that has side arms. You can use this for support while you get dressed. Do not have throw rugs and other things on the floor that can make you trip. What can I do in the kitchen? Clean up any spills right away. Avoid walking on wet floors. Keep items that you use a lot in easy-to-reach places. If you need to reach something above you, use a strong step stool that has a grab bar. Keep electrical cords out of the way.  Do not use floor polish or wax that makes floors slippery. If you must use wax, use non-skid floor wax. Do not have throw rugs and other things on the floor that can make you trip. What can I do with my stairs? Do not leave any items on the stairs. Make sure that there are handrails on both sides of the stairs and use them. Fix handrails that are broken or loose. Make sure that handrails are as long as the stairways. Check any carpeting to make sure that it is firmly attached to the stairs. Fix any carpet that is loose or worn. Avoid having throw rugs at the top or bottom of the stairs. If you do have throw rugs, attach them to the floor with carpet tape. Make sure that you have a light switch at the top of the stairs and the bottom of the stairs. If you do not have them, ask someone to add them for you. What else can I do to help prevent falls? Wear shoes that: Do not have high heels. Have  rubber bottoms. Are comfortable and fit you well. Are closed at the toe. Do not wear sandals. If you use a stepladder: Make sure that it is fully opened. Do not climb a closed stepladder. Make sure that both sides of the stepladder are locked into place. Ask someone to hold it for you, if possible. Clearly mark and make sure that you can see: Any grab bars or handrails. First and last steps. Where the edge of each step is. Use tools that help you move around (mobility aids) if they are needed. These include: Canes. Walkers. Scooters. Crutches. Turn on the lights when you go into a dark area. Replace any light bulbs as soon as they burn out. Set up your furniture so you have a clear path. Avoid moving your furniture around. If any of your floors are uneven, fix them. If there are any pets around you, be aware of where they are. Review your medicines with your doctor. Some medicines can make you feel dizzy. This can increase your chance of falling. Ask your doctor what other things that you can do to help prevent falls. This information is not intended to replace advice given to you by your health care provider. Make sure you discuss any questions you have with your health care provider. Document Released: 05/04/2009 Document Revised: 12/14/2015 Document Reviewed: 08/12/2014 Elsevier Interactive Patient Education  2017 [employer].

## 2023-01-17 ENCOUNTER — Other Ambulatory Visit: Payer: Self-pay | Admitting: Internal Medicine

## 2023-01-20 NOTE — Telephone Encounter (Signed)
Requested Prescriptions  Pending Prescriptions Disp Refills   lisinopril (ZESTRIL) 5 MG tablet [Pharmacy Med Name: LISINOPRIL 5 MG TABLET] 90 tablet 0    Sig: TAKE 1 TABLET (5 MG TOTAL) BY MOUTH DAILY.     Cardiovascular:  ACE Inhibitors Failed - 01/17/2023  5:49 PM      Failed - K in normal range and within 180 days    Potassium  Date Value Ref Range Status  10/17/2022 5.4 (H) 3.5 - 5.3 mmol/L Final         Passed - Cr in normal range and within 180 days    Creat  Date Value Ref Range Status  10/17/2022 1.10 0.70 - 1.28 mg/dL Final   Creatinine,U  Date Value Ref Range Status  03/20/2017 115.3 mg/dL Final   Creatinine, Urine  Date Value Ref Range Status  09/20/2021 209 20 - 320 mg/dL Final         Passed - Patient is not pregnant      Passed - Last BP in normal range    BP Readings from Last 1 Encounters:  10/17/22 136/72         Passed - Valid encounter within last 6 months    Recent Outpatient Visits           3 months ago Encounter for general adult medical examination with abnormal findings   [facility] [place], Salvadore Oxford, NP   5 months ago 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[facility])   [place] [facility] [place], Salvadore Oxford, NP   1 year ago Encounter for general adult medical examination with abnormal findings   [place] [facility] [place], Salvadore Oxford, NP   1 year ago Aortic atherosclerosis [facility])   [place] [facility] [place], Salvadore Oxford, NP   2 years ago Sacral pain   [place] [facility] [place], Salvadore Oxford, NP       Future Appointments             In 2 months [place], Salvadore Oxford, NP [place] [facility]            Refused Prescriptions Disp Refills   memantine (NAMENDA XR) 14 MG CP24 24 hr capsule [Pharmacy Med Name: MEMANTINE HCL ER 14 MG CAPSULE] 90 capsule 0    Sig: TAKE 1 CAPSULE (14 MG TOTAL) BY MOUTH DAILY.     Neurology:   Alzheimer's Agents 2 Passed - 01/17/2023  5:49 PM      Passed - Cr in normal range and within 360 days    Creat  Date Value Ref Range Status  10/17/2022 1.10 0.70 - 1.28 mg/dL Final   Creatinine,U  Date Value Ref Range Status  03/20/2017 115.3 mg/dL Final   Creatinine, Urine  Date Value Ref Range Status  09/20/2021 209 20 - 320 mg/dL Final         Passed - eGFR is 5 or above and within 360 days    GFR calc Af Amer  Date Value Ref Range Status  12/25/2019 >60 >60 mL/min Final   GFR, Estimated  Date Value Ref Range Status  01/23/2021 >60 >60 mL/min Final    Comment:    (NOTE) Calculated using the CKD-EPI Creatinine Equation (2021)    GFR  Date Value Ref Range Status  01/06/2020 59.40 (L) >60.00 mL/min Final   eGFR  Date Value Ref Range Status  10/17/2022 68 > OR =  60 mL/min/1.30m2 Final         Passed - Valid encounter within last 6 months    Recent Outpatient Visits           3 months ago Encounter for general adult medical examination with abnormal findings   [place] [facility] [place], Salvadore Oxford, NP   5 months ago 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[facility])   [place] [facility] [place], Salvadore Oxford, NP   1 year ago Encounter for general adult medical examination with abnormal findings   [place] [facility] [place], Salvadore Oxford, NP   1 year ago Aortic atherosclerosis [facility])   [place] [facility] [place], Salvadore Oxford, NP   2 years ago Sacral pain   [place] [facility] [place], Salvadore Oxford, NP       Future Appointments             In 2 months [place], Salvadore Oxford, NP [place] [facility]

## 2023-01-20 NOTE — Telephone Encounter (Signed)
Rx- memantine no longer listed on current medication list Requested Prescriptions  Pending Prescriptions Disp Refills   memantine (NAMENDA XR) 14 MG CP24 24 hr capsule [Pharmacy Med Name: MEMANTINE HCL ER 14 MG CAPSULE] 90 capsule 0    Sig: TAKE 1 CAPSULE (14 MG TOTAL) BY MOUTH DAILY.     Neurology:  Alzheimer's Agents 2 Passed - 01/17/2023  5:49 PM      Passed - Cr in normal range and within 360 days    Creat  Date Value Ref Range Status  10/17/2022 1.10 0.70 - 1.28 mg/dL Final   Creatinine,U  Date Value Ref Range Status  03/20/2017 115.3 mg/dL Final   Creatinine, Urine  Date Value Ref Range Status  09/20/2021 209 20 - 320 mg/dL Final         Passed - eGFR is 5 or above and within 360 days    GFR calc Af Amer  Date Value Ref Range Status  12/25/2019 >60 >60 mL/min Final   GFR, Estimated  Date Value Ref Range Status  01/23/2021 >60 >60 mL/min Final    Comment:    (NOTE) Calculated using the CKD-EPI Creatinine Equation (2021)    GFR  Date Value Ref Range Status  01/06/2020 59.40 (L) >60.00 mL/min Final   eGFR  Date Value Ref Range Status  10/17/2022 68 > OR = 60 mL/min/1.11m2 Final         Passed - Valid encounter within last 6 months    Recent Outpatient Visits           3 months ago Encounter for general adult medical examination with abnormal findings   [place] [facility] [place], Salvadore Oxford, NP   5 months ago 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[facility])   [place] [facility] [place], Salvadore Oxford, NP   1 year ago Encounter for general adult medical examination with abnormal findings   [place] [facility] [place], Salvadore Oxford, NP   1 year ago Aortic atherosclerosis [facility])   [place] [facility] [place], Salvadore Oxford, NP   2 years ago Sacral pain   [place] [facility] [place], Salvadore Oxford, NP       Future Appointments             In 2 months Baity, Salvadore Oxford, NP [place] [facility], [facility]             lisinopril (ZESTRIL) 5 MG tablet [Pharmacy Med Name: LISINOPRIL 5 MG TABLET] 90 tablet 0    Sig: TAKE 1 TABLET (5 MG TOTAL) BY MOUTH DAILY.     Cardiovascular:  ACE Inhibitors Failed - 01/17/2023  5:49 PM      Failed - K in normal range and within 180 days    Potassium  Date Value Ref Range Status  10/17/2022 5.4 (H) 3.5 - 5.3 mmol/L Final         Passed - Cr in normal range and within 180 days    Creat  Date Value Ref Range Status  10/17/2022 1.10 0.70 - 1.28 mg/dL Final   Creatinine,U  Date Value Ref Range Status  03/20/2017 115.3 mg/dL Final   Creatinine, Urine  Date Value Ref Range Status  09/20/2021 209 20 - 320 mg/dL Final         Passed - Patient is not pregnant      Passed - Last BP in normal range    BP  Readings from Last 1 Encounters:  10/17/22 136/72         Passed - Valid encounter within last 6 months    Recent Outpatient Visits           3 months ago Encounter for general adult medical examination with abnormal findings   [place] [facility] [address], Salvadore Oxford, NP   5 months ago 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[facility])   [place] [facility] [place], Salvadore Oxford, NP   1 year ago Encounter for general adult medical examination with abnormal findings   [place] [facility] [place], Salvadore Oxford, NP   1 year ago Aortic atherosclerosis [facility])   [place] [facility] [place], Salvadore Oxford, NP   2 years ago Sacral pain   [place] [facility] [place], Salvadore Oxford, NP       Future Appointments             In 2 months Baity, Salvadore Oxford, NP [place] [facility]

## 2023-01-23 ENCOUNTER — Other Ambulatory Visit: Payer: Self-pay | Admitting: Internal Medicine

## 2023-01-23 ENCOUNTER — Encounter: Payer: Self-pay | Admitting: Internal Medicine

## 2023-01-24 ENCOUNTER — Other Ambulatory Visit: Payer: Self-pay | Admitting: Internal Medicine

## 2023-01-24 DIAGNOSIS — G20A1 Parkinson's disease without dyskinesia, without mention of fluctuations: Secondary | ICD-10-CM

## 2023-01-24 DIAGNOSIS — F32A Depression, unspecified: Secondary | ICD-10-CM

## 2023-01-24 DIAGNOSIS — G4700 Insomnia, unspecified: Secondary | ICD-10-CM

## 2023-01-24 DIAGNOSIS — F03918 Unspecified dementia, unspecified severity, with other behavioral disturbance: Secondary | ICD-10-CM

## 2023-01-24 MED ORDER — MEMANTINE HCL ER 14 MG PO CP24: 14.0000 mg | ORAL_CAPSULE | Freq: Every day | ORAL | 0 refills | Status: DC

## 2023-01-24 NOTE — Telephone Encounter (Signed)
Requested medication (s) are due for refill today:yes  Requested medication (s) are on the active medication list: yes  Last refill:  11/22/22 #45  Future visit scheduled: yes  Notes to clinic:  med not delegated to NT to RF   Requested Prescriptions  Pending Prescriptions Disp Refills   LORazepam (ATIVAN) 0.5 MG tablet [Pharmacy Med Name: LORAZEPAM 0.5 MG TABLET] 45 tablet     Sig: TAKE 1 TO 2 TABLETS BY MOUTH TWICE A DAY AS NEEDED     Not Delegated - Psychiatry: Anxiolytics/Hypnotics 2 Failed - 01/23/2023  2:37 PM      Failed - This refill cannot be delegated      Failed - Urine Drug Screen completed in last 360 days      Passed - Patient is not pregnant      Passed - Valid encounter within last 6 months    Recent Outpatient Visits           3 months ago Encounter for general adult medical examination with abnormal findings   [place] [facility] [place], Salvadore Oxford, NP   5 months ago 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[facility] [place])   [place] [facility] [place], Salvadore Oxford, NP   1 year ago Encounter for general adult medical examination with abnormal findings   [place] [facility] [place], Salvadore Oxford, NP   1 year ago Aortic atherosclerosis [facility])   [place] [facility] [place], Salvadore Oxford, NP   2 years ago Sacral pain   [place] [facility] [place], Salvadore Oxford, NP       Future Appointments             In 2 months Baity, Salvadore Oxford, NP [place] [facility], [facility]            Signed Prescriptions Disp Refills   tamsulosin (FLOMAX) 0.4 MG CAPS capsule 90 capsule 1    Sig: TAKE 1 CAPSULE BY MOUTH EVERY DAY     Urology: Alpha-Adrenergic Blocker Passed - 01/23/2023  2:37 PM      Passed - PSA in normal range and within 360 days    PSA  Date Value Ref Range Status  10/17/2022 0.69 < OR = 4.00 ng/mL Final    Comment:    The total PSA value from this assay system is  standardized  against the WHO standard. The test  result will be approximately 20% lower when compared  to the equimolar-standardized total PSA (Beckman  Coulter). Comparison of serial PSA results should be  interpreted with this fact in mind. . This test was performed using the Siemens  chemiluminescent method. Values obtained from  different assay methods cannot be used interchangeably. PSA levels, regardless of value, should not be interpreted as absolute evidence of the presence or absence of disease.   06/01/2019 0.56 0.10 - 4.00 ng/ml Final    Comment:    Test performed using Access [employer]ch PSA Assay, a parmagnetic partical, chemiluminecent immunoassay.         Passed - Last BP in normal range    BP Readings from Last 1 Encounters:  10/17/22 136/72         Passed - Valid encounter within last 12 months    Recent Outpatient Visits           3 months ago Encounter for general adult medical examination with abnormal findings    [facility] [place], Salvadore Oxford, NP   5 months ago 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[facility])   [place] [facility] [place], Salvadore Oxford, NP   1 year ago Encounter for general adult medical examination with abnormal findings   [place] [facility] [employer], Salvadore Oxford, NP   1 year ago Aortic atherosclerosis [facility])   [place] [facility] [place], Salvadore Oxford, NP   2 years ago Sacral pain   [place] [facility] [place], Salvadore Oxford, NP       Future Appointments             In 2 months Baity, Salvadore Oxford, NP [place] [facility], [facility]

## 2023-01-24 NOTE — Telephone Encounter (Signed)
Requested Prescriptions  Pending Prescriptions Disp Refills   divalproex (DEPAKOTE ER) 500 MG 24 hr tablet [Pharmacy Med Name: DIVALPROEX SODIUM ER 500 MG Tablet Extended Release 24 Hour] 180 tablet 0    Sig: TAKE 2 TABLETS EVERY DAY     Neurology:  Anticonvulsants - Valproates Failed - 01/24/2023  2:08 AM      Failed - AST in normal range and within 360 days    AST  Date Value Ref Range Status  10/17/2022 8 (L) 10 - 35 U/L Final         Failed - ALT in normal range and within 360 days    ALT  Date Value Ref Range Status  10/17/2022 4 (L) 9 - 46 U/L Final         Failed - Valproic Acid (serum) in normal range and within 360 days    Valproic Acid Lvl  Date Value Ref Range Status  12/17/2016 53 50.0 - 100.0 ug/mL Final         Passed - HGB in normal range and within 360 days    Hemoglobin  Date Value Ref Range Status  08/21/2022 13.7 13.2 - 17.1 g/dL Final         Passed - PLT in normal range and within 360 days    Platelets  Date Value Ref Range Status  08/21/2022 185 140 - 400 Thousand/uL Final         Passed - WBC in normal range and within 360 days    WBC  Date Value Ref Range Status  08/21/2022 6.0 3.8 - 10.8 Thousand/uL Final         Passed - HCT in normal range and within 360 days    HCT  Date Value Ref Range Status  08/21/2022 39.8 38.5 - 50.0 % Final         Passed - Completed PHQ-2 or PHQ-9 in the last 360 days      Passed - Patient is not pregnant      Passed - Valid encounter within last 12 months    Recent Outpatient Visits           3 months ago Encounter for general adult medical examination with abnormal findings   [place] [facility] [place], Salvadore Oxford, NP   5 months ago 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[facility])   [place] [facility] [place], Salvadore Oxford, NP   1 year ago Encounter for general adult medical examination with abnormal findings   [place] [facility] [place], Salvadore Oxford, NP   1  year ago Aortic atherosclerosis [facility])   [place] [facility], Salvadore Oxford, NP   2 years ago Sacral pain   [place] [facility] [place], Salvadore Oxford, NP       Future Appointments             In 2 months Ba[place], Salvadore Oxford, NP [place] [facility]             donepezil (ARICEPT) 10 MG tablet [Pharmacy Med Name: DONEPEZIL HYDROCHLORIDE 10 MG Tablet] 90 tablet 0    Sig: TAKE 1 TABLET EVERY DAY     Neurology:  Alzheimer's Agents Passed - 01/24/2023  2:08 AM      Passed - Valid encounter within last 6 months    Recent Outpatient Visits           3 months ago  Encounter for general adult medical examination with abnormal findings   [place] [facility] [place], Salvadore Oxford, NP   5 months ago 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[facility])   [place] [facility] [place], Salvadore Oxford, NP   1 year ago Encounter for general adult medical examination with abnormal findings   [facility] [place], Salvadore Oxford, NP   1 year ago Aortic atherosclerosis [facility])   [place] [facility] [place], Salvadore Oxford, NP   2 years ago Sacral pain   [place] [facility] [place], Salvadore Oxford, NP       Future Appointments             In 2 months [place], Salvadore Oxford, NP [place] [facility], [facility]             carbidopa-levodopa (SINEMET IR) 25-100 MG tablet [Pharmacy Med Name: CARBIDOPA/LEVODOPA 25-100 MG Tablet] 270 tablet 0    Sig: TAKE 1 TABLET THREE TIMES DAILY (AT 7AM, 11AM, 4PM)     Neurology:  Parkinsonian Agents 2 Failed - 01/24/2023  2:08 AM      Failed - AST in normal range and within 360 days    AST  Date Value Ref Range Status  10/17/2022 8 (L) 10 - 35 U/L Final         Failed - ALT in normal range and within 360 days    ALT  Date Value Ref Range Status  10/17/2022 4 (L) 9 - 46 U/L Final         Passed - WBC in normal range and within 360  days    WBC  Date Value Ref Range Status  08/21/2022 6.0 3.8 - 10.8 Thousand/uL Final         Passed - PLT in normal range and within 360 days    Platelets  Date Value Ref Range Status  08/21/2022 185 140 - 400 Thousand/uL Final         Passed - HGB in normal range and within 360 days    Hemoglobin  Date Value Ref Range Status  08/21/2022 13.7 13.2 - 17.1 g/dL Final         Passed - HCT in normal range and within 360 days    HCT  Date Value Ref Range Status  08/21/2022 39.8 38.5 - 50.0 % Final         Passed - Cr in normal range and within 360 days    Creat  Date Value Ref Range Status  10/17/2022 1.10 0.70 - 1.28 mg/dL Final   Creatinine,U  Date Value Ref Range Status  03/20/2017 115.3 mg/dL Final   Creatinine, Urine  Date Value Ref Range Status  09/20/2021 209 20 - 320 mg/dL Final         Passed - Last BP in normal range    BP Readings from Last 1 Encounters:  10/17/22 136/72         Passed - Valid encounter within last 12 months    Recent Outpatient Visits           3 months ago Encounter for general adult medical examination with abnormal findings   [place] [facility] [place], Salvadore Oxford, NP   5 months ago 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[facility])    [facility] [place], Salvadore Oxford, NP   1 year ago Encounter for general adult medical examination with  abnormal findings   [facility] [place], Salvadore Oxford, NP   1 year ago Aortic atherosclerosis [facility])   [place] [facility] [place], Salvadore Oxford, NP   2 years ago Sacral pain   [place] [facility] [place], Salvadore Oxford, NP       Future Appointments             In 2 months Baity, Salvadore Oxford, NP [place] [facility], [facility]

## 2023-01-24 NOTE — Telephone Encounter (Signed)
Requested Prescriptions  Pending Prescriptions Disp Refills   tamsulosin (FLOMAX) 0.4 MG CAPS capsule [Pharmacy Med Name: TAMSULOSIN HCL 0.4 MG CAPSULE] 90 capsule 1    Sig: TAKE 1 CAPSULE BY MOUTH EVERY DAY     Urology: Alpha-Adrenergic Blocker Passed - 01/23/2023  2:37 PM      Passed - PSA in normal range and within 360 days    PSA  Date Value Ref Range Status  10/17/2022 0.69 < OR = 4.00 ng/mL Final    Comment:    The total PSA value from this assay system is  standardized against the WHO standard. The test  result will be approximately 20% lower when compared  to the equimolar-standardized total PSA (Beckman  Coulter). Comparison of serial PSA results should be  interpreted with this fact in mind. . This test was performed using the Siemens  chemiluminescent method. Values obtained from  different assay methods cannot be used interchangeably. PSA levels, regardless of value, should not be interpreted as absolute evidence of the presence or absence of disease.   06/01/2019 0.56 0.10 - 4.00 ng/ml Final    Comment:    Test performed using Access Hybritech PSA Assay, a parmagnetic partical, chemiluminecent immunoassay.         Passed - Last BP in normal range    BP Readings from Last 1 Encounters:  10/17/22 136/72         Passed - Valid encounter within last 12 months    Recent Outpatient Visits           3 months ago Encounter for general adult medical examination with abnormal findings   [place] [facility] [place], Salvadore Oxford, NP   5 months ago 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[facility])   [place] [facility] [place], Salvadore Oxford, NP   1 year ago Encounter for general adult medical examination with abnormal findings   [place] [facility] [place], Salvadore Oxford, NP   1 year ago Aortic atherosclerosis [facility])   [place] [facility] [place], Salvadore Oxford, NP   2 years ago Sacral pain   [place] [facility] [place], Salvadore Oxford, NP       Future Appointments             In 2 months Baity, Salvadore Oxford, NP [place] [facility]             LORazepam (ATIVAN) 0.5 MG tablet [Pharmacy Med Name: LORAZEPAM 0.5 MG TABLET] 45 tablet     Sig: TAKE 1 TO 2 TABLETS BY MOUTH TWICE A DAY AS NEEDED     Not Delegated - Psychiatry: Anxiolytics/Hypnotics 2 Failed - 01/23/2023  2:37 PM      Failed - This refill cannot be delegated      Failed - Urine Drug Screen completed in last 360 days      Passed - Patient is not pregnant      Passed - Valid encounter within last 6 months    Recent Outpatient Visits           3 months ago Encounter for general adult medical examination with abnormal findings   [place] [facility] [place], Salvadore Oxford, NP   5 months ago 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[facility])   [place] [facility] [place], Salvadore Oxford, NP   1 year ago Encounter for general adult medical examination with abnormal findings  [place] [facility] [place], Salvadore Oxford, NP   1 year ago Aortic atherosclerosis [facility])   [place] [facility] [place], Salvadore Oxford, NP   2 years ago Sacral pain   [place] [place], Salvadore Oxford, NP       Future Appointments             In 2 months Baity, Salvadore Oxford, NP [place] [facility]

## 2023-02-06 ENCOUNTER — Telehealth: Payer: Self-pay | Admitting: *Deleted

## 2023-02-06 NOTE — Patient Outreach (Signed)
  Care Coordination   02/06/2023 Name: Alexander Booth MRN: [id number] DOB: 10-Jun-1946   Care Coordination Outreach Attempts:  An unsuccessful telephone outreach was attempted today to offer the patient information about available care coordination services.  Follow Up Plan:  Additional outreach attempts will be made to offer the patient care coordination information and services.   Encounter Outcome:  No Answer   Care Coordination Interventions:  No, not indicated  {THN Tip this will not be part of the note when signed-REQUIRED REPORT FIELD DO NOT DELETE (Optional):  Kemper Durie, RN, MSN, [facility] Care Management Care Management Coordinator [phone]

## 2023-02-10 ENCOUNTER — Telehealth: Payer: Self-pay | Admitting: *Deleted

## 2023-02-10 NOTE — Patient Instructions (Signed)
Visit Information  Thank you for taking time to visit with me today. Please don't hesitate to contact me if I can be of assistance to you.   Please call the Suicide and Crisis Lifeline: 988 call the [place] National Suicide Prevention Lifeline: 8130983144 or TTY: 470-380-9009 TTY [phone]) to talk to a trained counselor call 1-800-273-TALK (toll free, 24 hour hotline) call 911 if you are experiencing a Mental Health or Behavioral Health Crisis or need someone to talk to.  Patient verbalizes understanding of instructions and care plan provided today and agrees to view in MyChart. Active MyChart status and patient understanding of how to access instructions and care plan via MyChart confirmed with patient.     The patient has been provided with contact information for the care management team and has been advised to call with any health related questions or concerns.   Kemper Durie [facility] Care Management Care Management Coordinator [phone]

## 2023-02-10 NOTE — Patient Outreach (Signed)
  Care Coordination   Initial Visit Note   02/10/2023 Name: Alexander Booth MRN: [id number] DOB: 1946/03/16  Alexander Booth is a 77 y.o. year old male who sees [place], Salvadore Oxford, NP for primary care. I spoke with [name], daughter of Aydrien Froman [facility] by phone today.  What matters to the patients health and wellness today? Per daughter, report patient is doing well.  He lives with his wife, but daughter and son live within miles away and is very supportive in management of his care.  Denies needs at this time but agrees to contact [facility] with any questions in the future.    SDOH assessments and interventions completed:  No     Care Coordination Interventions:  No, not indicated   Follow up plan: No further intervention required.   Encounter Outcome:  Pt. Visit Completed   Kemper Durie, RN, MSN, [facility] Care Management Care Management Coordinator [phone]

## 2023-02-12 ENCOUNTER — Ambulatory Visit (INDEPENDENT_AMBULATORY_CARE_PROVIDER_SITE_OTHER): Payer: Medicare HMO | Admitting: Internal Medicine

## 2023-02-12 ENCOUNTER — Encounter: Payer: Self-pay | Admitting: Internal Medicine

## 2023-02-12 ENCOUNTER — Ambulatory Visit
Admission: RE | Admit: 2023-02-12 | Discharge: 2023-02-12 | Disposition: A | Discharge status: Home / Self Care | Payer: Medicare HMO | Attending: Internal Medicine | Admitting: Internal Medicine

## 2023-02-12 ENCOUNTER — Ambulatory Visit: Admission: RE | Admit: 2023-02-12 | Payer: Medicare HMO | Source: Ambulatory Visit

## 2023-02-12 VITALS — BP 132/74 | HR 89 | Ht 72.0 in | Wt 175.0 lb

## 2023-02-12 DIAGNOSIS — R5383 Other fatigue: Secondary | ICD-10-CM | POA: Diagnosis not present

## 2023-02-12 DIAGNOSIS — R079 Chest pain, unspecified: Secondary | ICD-10-CM

## 2023-02-12 DIAGNOSIS — R296 Repeated falls: Secondary | ICD-10-CM | POA: Diagnosis not present

## 2023-02-12 DIAGNOSIS — R0602 Shortness of breath: Secondary | ICD-10-CM

## 2023-02-12 DIAGNOSIS — I700 Atherosclerosis of aorta: Secondary | ICD-10-CM | POA: Diagnosis not present

## 2023-02-12 DIAGNOSIS — Z981 Arthrodesis status: Secondary | ICD-10-CM | POA: Diagnosis not present

## 2023-02-12 DIAGNOSIS — E1142 Type 2 diabetes mellitus with diabetic polyneuropathy: Secondary | ICD-10-CM | POA: Diagnosis not present

## 2023-02-12 DIAGNOSIS — J449 Chronic obstructive pulmonary disease, unspecified: Secondary | ICD-10-CM | POA: Diagnosis not present

## 2023-02-12 DIAGNOSIS — R531 Weakness: Secondary | ICD-10-CM

## 2023-02-12 DIAGNOSIS — I10 Essential (primary) hypertension: Secondary | ICD-10-CM | POA: Diagnosis not present

## 2023-02-12 DIAGNOSIS — K219 Gastro-esophageal reflux disease without esophagitis: Secondary | ICD-10-CM | POA: Diagnosis not present

## 2023-02-12 DIAGNOSIS — E781 Pure hyperglyceridemia: Secondary | ICD-10-CM

## 2023-02-12 DIAGNOSIS — G20B2 Parkinson's disease with dyskinesia, with fluctuations: Secondary | ICD-10-CM

## 2023-02-12 DIAGNOSIS — E118 Type 2 diabetes mellitus with unspecified complications: Secondary | ICD-10-CM

## 2023-02-12 DIAGNOSIS — R071 Chest pain on breathing: Secondary | ICD-10-CM | POA: Diagnosis not present

## 2023-02-12 LAB — CBC
HCT: 36.3 % — ABNORMAL LOW (ref 38.5–50.0)
MCH: 32.3 pg (ref 27.0–33.0)
MPV: 11.5 fL (ref 7.5–12.5)
Platelets: 150 10*3/uL (ref 140–400)
RBC: 3.71 10*6/uL — ABNORMAL LOW (ref 4.20–5.80)
RDW: 11.7 % (ref 11.0–15.0)
WBC: 6.4 10*3/uL (ref 3.8–10.8)

## 2023-02-12 NOTE — Patient Instructions (Signed)
Fall Prevention in the Home, Adult Falls can cause injuries and affect people of all ages. There are many simple things that you can do to make your home safe and to help prevent falls. If you need it, ask for help making these changes. What actions can I take to prevent falls? General information Use good lighting in all rooms. Make sure to: Replace any light bulbs that burn out. Turn on lights if it is dark and use night-lights. Keep items that you use often in easy-to-reach places. Lower the shelves around your home if needed. Move furniture so that there are clear paths around it. Do not keep throw rugs or other things on the floor that can make you trip. If any of your floors are uneven, fix them. Add color or contrast paint or tape to clearly mark and help you see: Grab bars or handrails. First and last steps of staircases. Where the edge of each step is. If you use a ladder or stepladder: Make sure that it is fully opened. Do not climb a closed ladder. Make sure the sides of the ladder are locked in place. Have someone hold the ladder while you use it. Know where your pets are as you move through your home. What can I do in the bathroom?     Keep the floor dry. Clean up any water that is on the floor right away. Remove soap buildup in the bathtub or shower. Buildup makes bathtubs and showers slippery. Use non-skid mats or decals on the floor of the bathtub or shower. Attach bath mats securely with double-sided, non-slip rug tape. If you need to sit down while you are in the shower, use a non-slip stool. Install grab bars by the toilet and in the bathtub and shower. Do not use towel bars as grab bars. What can I do in the bedroom? Make sure that you have a light by your bed that is easy to reach. Do not use any sheets or blankets on your bed that hang to the floor. Have a firm bench or chair with side arms that you can use for support when you get dressed. What can I do in  the kitchen? Clean up any spills right away. If you need to reach something above you, use a sturdy step stool that has a grab bar. Keep electrical cables out of the way. Do not use floor polish or wax that makes floors slippery. What can I do with my stairs? Do not leave anything on the stairs. Make sure that you have a light switch at the top and the bottom of the stairs. Have them installed if you do not have them. Make sure that there are handrails on both sides of the stairs. Fix handrails that are broken or loose. Make sure that handrails are as long as the staircases. Install non-slip stair treads on all stairs in your home if they do not have carpet. Avoid having throw rugs at the top or bottom of stairs, or secure the rugs with carpet tape to prevent them from moving. Choose a carpet design that does not hide the edge of steps on the stairs. Make sure that carpet is firmly attached to the stairs. Fix any carpet that is loose or worn. What can I do on the outside of my home? Use bright outdoor lighting. Repair the edges of walkways and driveways and fix any cracks. Clear paths of anything that can make you trip, such as tools or rocks. Add   color or contrast paint or tape to clearly mark and help you see high doorway thresholds. Trim any bushes or trees on the main path into your home. Check that handrails are securely fastened and in good repair. Both sides of all steps should have handrails. Install guardrails along the edges of any raised decks or porches. Have leaves, snow, and ice cleared regularly. Use sand, salt, or ice melt on walkways during winter months if you live where there is ice and snow. In the garage, clean up any spills right away, including grease or oil spills. What other actions can I take? Review your medicines with your health care provider. Some medicines can make you confused or feel dizzy. This can increase your chance of falling. Wear closed-toe shoes that  fit well and support your feet. Wear shoes that have rubber soles and low heels. Use a cane, walker, scooter, or crutches that help you move around if needed. Talk with your provider about other ways that you can decrease your risk of falls. This may include seeing a physical therapist to learn to do exercises to improve movement and strength. Where to find more information Centers for Disease Control and Prevention, [employer]: cdc.gov National Institute on Aging: nia.nih.gov National Institute on Aging: nia.nih.gov Contact a health care provider if: You are afraid of falling at home. You feel weak, drowsy, or dizzy at home. You fall at home. Get help right away if you: Lose consciousness or have trouble moving after a fall. Have a fall that causes a head injury. These symptoms may be an emergency. Get help right away. Call 911. Do not wait to see if the symptoms will go away. Do not drive yourself to the hospital. This information is not intended to replace advice given to you by your health care provider. Make sure you discuss any questions you have with your health care provider. Document Revised: 03/11/2022 Document Reviewed: 03/11/2022 Elsevier Patient Education  2024 [employer]  

## 2023-02-12 NOTE — Progress Notes (Signed)
Subjective:    Patient ID: Alexander Booth, male    DOB: [birth year], 77 y.o.   MRN: [id number]  HPI  Patient presents to clinic today with complaint of fatigue and chest pain.  He noticed this 4 months ago.  He describes the chest pain as pressure.  The pain radiates through to his back.  He reports chronic shortness of breath that seems to have worsened.  He does not sleep well at night despite trazodone.  His appetite is fair, his daughter reports he eats 1 good meal per day.  He has had constipation at times but reports this seems better lately.  He also reports frequent falls almost daily.  He reports his legs will just give out from underneath him.  He is using a cane, walker and transport wheelchair.  He has a history of COPD managed on breo and albuterol.  He does continue to smoke.  He has a history of HTN, HLD and aortic atherosclerosis which is managed on lisinopril, aspirin and atorvastatin.  He has a history of DM 2, last A1c 6.5%, managed on metformin.  He has a history of Parkinson's and dementia managed on sinemet, mirtazapine and namenda.  He has a history of GERD managed on pantoprazole but reports this chest pain feels different than heartburn.  Review of Systems     Past Medical History:  Diagnosis Date   Arthritis    COPD (chronic obstructive pulmonary disease) (HCC)    Depression    Dyspnea    Erectile dysfunction    He's had a hx of    Hypercholesterolemia    Hypertension    Essential   Lumbosacral disc disease    with chronic low back pain, has implanted nerve stimulator   Peripheral neuropathy    Presenile dementia    Type 2 diabetes mellitus (HCC) 2010   takes oral meds    Current Outpatient Medications  Medication Sig Dispense Refill   Accu-Chek Softclix Lancets lancets Check sugar 2 times daily. DX E11.8 200 each 3   acetaminophen (TYLENOL) 500 MG tablet Take 1,000 mg by mouth every 6 (six) hours as needed for moderate pain.     Albuterol Sulfate  (PROAIR RESPICLICK) 108 (90 Base) MCG/ACT AEPB Inhale 1-2 puffs into the lungs every 4 (four) hours as needed. (Patient taking differently: Inhale 1-2 puffs into the lungs every 4 (four) hours as needed (shortness of breath).) 1 each 11   aspirin EC 81 MG tablet Take 81 mg by mouth daily with breakfast.     atorvastatin (LIPITOR) 40 MG tablet Take 1 tablet (40 mg total) by mouth daily. 90 tablet 1   Blood Glucose Monitoring Suppl (ACCU-CHEK AVIVA PLUS) w/Device KIT Check sugar 2 times daily. DX E11.8 1 kit 12   BREO ELLIPTA 100-25 MCG/INH AEPB INHALE 1 PUFF BY MOUTH EVERY DAY 60 each 2   carbidopa-levodopa (SINEMET IR) 25-100 MG tablet TAKE 1 TABLET THREE TIMES DAILY (AT 7AM, 11AM, 4PM) 270 tablet 0   Cholecalciferol (QC VITAMIN D3) 50 MCG (2000 UT) TABS Take 2,000 Units by mouth daily with lunch.     ciprofloxacin (CIPRO) 500 MG tablet Take 1 tablet (500 mg total) by mouth 2 (two) times daily. One po bid x 7 days (Patient not taking: Reported on 01/09/2023) 14 tablet 0   citalopram (CELEXA) 20 MG tablet TAKE 1 TABLET EVERY DAY 90 tablet 1   cyclobenzaprine (FLEXERIL) 5 MG tablet Take 5 mg by mouth 2 (two) times daily.  divalproex (DEPAKOTE ER) 500 MG 24 hr tablet TAKE 2 TABLETS EVERY DAY 180 tablet 0   donepezil (ARICEPT) 10 MG tablet TAKE 1 TABLET EVERY DAY 90 tablet 0   gabapentin (NEURONTIN) 400 MG capsule TAKE 3 CAPSULES AT BEDTIME AND MAY REPEAT DOSE ONE TIME IF NEEDED 270 capsule 3   gentamicin cream (GARAMYCIN) 0.1 % Apply 1 Application topically 2 (two) times daily. (Patient not taking: Reported on 01/09/2023) 30 g 1   glucose blood (ACCU-CHEK AVIVA PLUS) test strip Check sugar 2 times daily. DX E11.8 200 each 3   glucose blood (ACCU-CHEK GUIDE) test strip Use as instructed 100 each 12   lisinopril (ZESTRIL) 5 MG tablet TAKE 1 TABLET (5 MG TOTAL) BY MOUTH DAILY. 90 tablet 0   LORazepam (ATIVAN) 0.5 MG tablet TAKE 1 TO 2 TABLETS BY MOUTH TWICE A DAY AS NEEDED 45 tablet 0   meloxicam  (MOBIC) 15 MG tablet TAKE 1 TABLET EVERY DAY WITH SUPPER 90 tablet 1   memantine (NAMENDA XR) 14 MG CP24 24 hr capsule Take 1 capsule (14 mg total) by mouth daily. 90 capsule 0   metFORMIN (GLUCOPHAGE) 500 MG tablet TAKE 1 TABLET TWICE DAILY WITH MEALS 180 tablet 1   mirtazapine (REMERON) 45 MG tablet TAKE 1 TABLET AT BEDTIME 90 tablet 1   nitroGLYCERIN (NITROSTAT) 0.4 MG SL tablet Place 0.4 mg under the tongue every 5 (five) minutes x 3 doses as needed for chest pain.     pantoprazole (PROTONIX) 20 MG tablet Take 1 tablet (20 mg total) by mouth daily. 90 tablet 1   tamsulosin (FLOMAX) 0.4 MG CAPS capsule TAKE 1 CAPSULE BY MOUTH EVERY DAY 90 capsule 1   traZODone (DESYREL) 50 MG tablet TAKE 1 TO 2 TABLETS AT BEDTIME 180 tablet 1   vitamin C (ASCORBIC ACID) 500 MG tablet Take 500 mg by mouth daily with lunch.     No current facility-administered medications for this visit.    Allergies  Allergen Reactions   Codeine Hives and Itching    Family History  Problem Relation Age of Onset   Multiple myeloma Mother    Diabetes Mother    Diabetes Sister    Lymphoma Sister    Stroke Neg Hx     Social History   Socioeconomic History   Marital status: Married    Spouse name: Bonita Quin   Number of children: 3   Years of education: 12   Highest education level: 12th grade  Occupational History   Occupation: retired    Comment: retired  Tobacco Use   Smoking status: Every Day    Current packs/day: 1.00    Average packs/day: 1 pack/day for 40.0 years (40.0 ttl pk-yrs)    Types: Cigarettes   Smokeless tobacco: Never  Vaping Use   Vaping status: Never Used  Substance and Sexual Activity   Alcohol use: No    Alcohol/week: 0.0 standard drinks of alcohol   Drug use: No   Sexual activity: Not on file  Other Topics Concern   Not on file  Social History Narrative   Patient is right handed   Patient resides with wife,consumes 2-3 cups of caffeine daily   Social Determinants of Health    Financial Resource Strain: Low Risk  (02/11/2023)   Overall Financial Resource Strain (CARDIA)    Difficulty of Paying Living Expenses: Not hard at all  Food Insecurity: No Food Insecurity (02/11/2023)   Hunger Vital Sign    Worried About Running Out of  Food in the Last Year: Never true    Ran Out of Food in the Last Year: Never true  Transportation Needs: No Transportation Needs (02/11/2023)   PRAPARE - Administrator, Civil Service (Medical): No    Lack of Transportation (Non-Medical): No  Physical Activity: Inactive (02/11/2023)   Exercise Vital Sign    Days of Exercise per Week: 0 days    Minutes of Exercise per Session: 0 min  Stress: No Stress Concern Present (01/09/2023)   [employer] of Occupational Health - Occupational Stress Questionnaire    Feeling of Stress : Not at all  Social Connections: Moderately Isolated (02/11/2023)   Social Connection and Isolation Panel [NHANES]    Frequency of Communication with Friends and Family: More than three times a week    Frequency of Social Gatherings with Friends and Family: More than three times a week    Attends Religious Services: Never    Database administrator or Organizations: No    Attends Banker Meetings: Never    Marital Status: Married  Catering manager Violence: Not At Risk (01/09/2023)   Humiliation, Afraid, Rape, and Kick questionnaire    Fear of Current or Ex-Partner: No    Emotionally Abused: No    Physically Abused: No    Sexually Abused: No     Constitutional: Patient reports fatigue.  Denies fever, malaise, headache or abrupt weight changes.  HEENT: Denies eye pain, eye redness, ear pain, ringing in the ears, wax buildup, runny nose, nasal congestion, bloody nose, or sore throat. Respiratory: Patient reports chronic shortness of breath.  Denies difficulty breathing, cough or sputum production.   Cardiovascular: Patient reports chest pain.  Denies chest tightness, palpitations or  swelling in the hands or feet.  Gastrointestinal: Patient reports intermittent constipation.  Denies abdominal pain, bloating,  diarrhea or blood in the stool.  GU: Patient reports nocturia.  Denies urgency, pain with urination, burning sensation, blood in urine, odor or discharge. Musculoskeletal: Patient reports chronic back pain, difficulty with gait, frequent falls.  Denies joint swelling.  Skin: Denies redness, rashes, lesions or ulcercations.  Neurological: Patient reports tremor, neuropathic pain, insomnia, difficulty with memory, difficulty with balance. denies dizziness, difficulty with speech or problems with coordination.  Psych: Patient has a history of anxiety.  Denies depression, SI/HI.  No other specific complaints in a complete review of systems (except as listed in HPI above).  Objective:   Physical Exam   BP 132/74   Pulse 89   Ht 6' (1.829 m)   Wt 175 lb (79.4 kg)   SpO2 94%   BMI 23.73 kg/m   Wt Readings from Last 3 Encounters:  01/09/23 175 lb (79.4 kg)  10/17/22 176 lb (79.8 kg)  08/21/22 178 lb (80.7 kg)    General: Appears his stated age, chronically ill-appearing, in NAD. Skin: Warm, dry and intact. No bruising, abrasion or ulcerations noted. HEENT: Head: normal shape and size; Eyes: sclera white, no icterus, conjunctiva pink, PERRLA and EOMs intact;   Cardiovascular: Normal rate and rhythm. S1,S2 noted.  No murmur, rubs or gallops noted. No JVD or BLE edema. No carotid bruits noted. Pulmonary/Chest: Normal effort and diminished breath sounds. No respiratory distress. No wheezes, rales or ronchi noted.  Abdomen: Soft and nontender.  Musculoskeletal: Some pain with patient of the left upper chest wall.  In wheelchair today. Neurological: Alert and oriented.  Resting tremor noted.    BMET    Component Value Date/Time  NA 140 10/17/2022 1012   NA 141 10/11/2014 1122   K 5.4 (H) 10/17/2022 1012   CL 101 10/17/2022 1012   CO2 28 10/17/2022 1012    GLUCOSE 107 10/17/2022 1012   BUN 21 10/17/2022 1012   BUN 11 10/11/2014 1122   CREATININE 1.10 10/17/2022 1012   CALCIUM 9.8 10/17/2022 1012   GFRNONAA >60 01/23/2021 0938   GFRAA >60 12/25/2019 0358    Lipid Panel     Component Value Date/Time   CHOL 140 10/17/2022 1012   TRIG 252 (H) 10/17/2022 1012   HDL 36 (L) 10/17/2022 1012   CHOLHDL 3.9 10/17/2022 1012   VLDL 75.6 (H) 04/27/2019 1116   LDLCALC 71 10/17/2022 1012    CBC    Component Value Date/Time   WBC 6.0 08/21/2022 1634   RBC 4.20 08/21/2022 1634   HGB 13.7 08/21/2022 1634   HCT 39.8 08/21/2022 1634   PLT 185 08/21/2022 1634   MCV 94.8 08/21/2022 1634   MCH 32.6 08/21/2022 1634   MCHC 34.4 08/21/2022 1634   RDW 11.8 08/21/2022 1634   RDW 13.5 10/11/2014 1122   LYMPHSABS 0.9 12/25/2019 0358   LYMPHSABS 1.3 10/11/2014 1122   MONOABS 0.5 12/25/2019 0358   EOSABS 0.0 12/25/2019 0358   EOSABS 0.0 10/11/2014 1122   BASOSABS 0.0 12/25/2019 0358   BASOSABS 0.0 10/11/2014 1122    Hgb A1C Lab Results  Component Value Date   HGBA1C 6.5 (H) 08/21/2022           Assessment & Plan:   Fatigue, chest pain, shortness of breath, frequent falls:  Indication for ECG: Chest pain Interpretation of ECG: Left anterior fascicular block but no concern for ACS Comparison of ECG: 01/2021, new onset left anterior fascicular block Chest x-ray today for further evaluation Referral to cardiology for further evaluation and treatment, possible stress test Referral to home health PT for evaluation and strengthening exercises  RTC in 2 months for follow up chronic conditions Nicki Reaper, NP

## 2023-02-13 LAB — CBC
Hemoglobin: 12 g/dL — ABNORMAL LOW (ref 13.2–17.1)
MCHC: 33.1 g/dL (ref 32.0–36.0)
MCV: 97.8 fL (ref 80.0–100.0)

## 2023-02-13 LAB — COMPLETE METABOLIC PANEL WITH GFR
AG Ratio: 1.5 (calc) (ref 1.0–2.5)
ALT: 3 U/L — ABNORMAL LOW (ref 9–46)
AST: 11 U/L (ref 10–35)
Albumin: 3.9 g/dL (ref 3.6–5.1)
Calcium: 9.6 mg/dL (ref 8.6–10.3)
Chloride: 102 mmol/L (ref 98–110)
Creat: 1.15 mg/dL (ref 0.70–1.28)
Globulin: 2.6 g/dL (calc) (ref 1.9–3.7)
Glucose, Bld: 118 mg/dL — ABNORMAL HIGH (ref 65–99)
Potassium: 6.4 mmol/L (ref 3.5–5.3)
Total Bilirubin: 0.3 mg/dL (ref 0.2–1.2)
Total Protein: 6.5 g/dL (ref 6.1–8.1)
eGFR: 65 mL/min/{1.73_m2} (ref >=60)

## 2023-02-13 LAB — LIPASE: Lipase: 19 U/L (ref 7–60)

## 2023-02-14 ENCOUNTER — Emergency Department (HOSPITAL_BASED_OUTPATIENT_CLINIC_OR_DEPARTMENT_OTHER)
Admission: EM | Admit: 2023-02-14 | Discharge: 2023-02-14 | Disposition: A | Discharge status: Home / Self Care | Payer: Medicare HMO | Attending: Emergency Medicine | Admitting: Emergency Medicine

## 2023-02-14 ENCOUNTER — Telehealth: Payer: Self-pay | Admitting: Internal Medicine

## 2023-02-14 ENCOUNTER — Other Ambulatory Visit: Payer: Self-pay

## 2023-02-14 ENCOUNTER — Encounter (HOSPITAL_BASED_OUTPATIENT_CLINIC_OR_DEPARTMENT_OTHER): Payer: Self-pay

## 2023-02-14 DIAGNOSIS — Z7984 Long term (current) use of oral hypoglycemic drugs: Secondary | ICD-10-CM | POA: Insufficient documentation

## 2023-02-14 DIAGNOSIS — Z7689 Persons encountering health services in other specified circumstances: Secondary | ICD-10-CM | POA: Diagnosis not present

## 2023-02-14 DIAGNOSIS — I10 Essential (primary) hypertension: Secondary | ICD-10-CM | POA: Diagnosis not present

## 2023-02-14 DIAGNOSIS — R7989 Other specified abnormal findings of blood chemistry: Secondary | ICD-10-CM | POA: Insufficient documentation

## 2023-02-14 DIAGNOSIS — F0390 Unspecified dementia without behavioral disturbance: Secondary | ICD-10-CM | POA: Insufficient documentation

## 2023-02-14 DIAGNOSIS — R079 Chest pain, unspecified: Secondary | ICD-10-CM | POA: Insufficient documentation

## 2023-02-14 DIAGNOSIS — G20C Parkinsonism, unspecified: Secondary | ICD-10-CM | POA: Diagnosis not present

## 2023-02-14 DIAGNOSIS — J449 Chronic obstructive pulmonary disease, unspecified: Secondary | ICD-10-CM | POA: Diagnosis not present

## 2023-02-14 DIAGNOSIS — E119 Type 2 diabetes mellitus without complications: Secondary | ICD-10-CM | POA: Insufficient documentation

## 2023-02-14 DIAGNOSIS — Z7982 Long term (current) use of aspirin: Secondary | ICD-10-CM | POA: Diagnosis not present

## 2023-02-14 DIAGNOSIS — R0789 Other chest pain: Secondary | ICD-10-CM | POA: Diagnosis not present

## 2023-02-14 DIAGNOSIS — Z79899 Other long term (current) drug therapy: Secondary | ICD-10-CM | POA: Insufficient documentation

## 2023-02-14 LAB — COMPREHENSIVE METABOLIC PANEL
ALT: <5 U/L (ref 0–44)
AST: 10 U/L — ABNORMAL LOW (ref 15–41)
Albumin: 3.7 g/dL (ref 3.5–5.0)
Alkaline Phosphatase: 50 U/L (ref 38–126)
Anion gap: 7 (ref 5–15)
BUN: 17 mg/dL (ref 8–23)
CO2: 32 mmol/L (ref 22–32)
Calcium: 9.5 mg/dL (ref 8.9–10.3)
Chloride: 99 mmol/L (ref 98–111)
Creatinine, Ser: 1.1 mg/dL (ref 0.61–1.24)
GFR, Estimated: >60 mL/min (ref >60)
Glucose, Bld: 109 mg/dL — ABNORMAL HIGH (ref 70–99)
Potassium: 5 mmol/L (ref 3.5–5.1)
Sodium: 138 mmol/L (ref 135–145)
Total Bilirubin: 0.3 mg/dL (ref 0.3–1.2)
Total Protein: 6.3 g/dL — ABNORMAL LOW (ref 6.5–8.1)

## 2023-02-14 LAB — CBC WITH DIFFERENTIAL/PLATELET
Abs Immature Granulocytes: 0.01 10*3/uL (ref 0.00–0.07)
Basophils Absolute: 0.1 10*3/uL (ref 0.0–0.1)
Basophils Relative: 1 %
Eosinophils Absolute: 0.1 10*3/uL (ref 0.0–0.5)
Eosinophils Relative: 2 %
HCT: 34 % — ABNORMAL LOW (ref 39.0–52.0)
Hemoglobin: 11.4 g/dL — ABNORMAL LOW (ref 13.0–17.0)
Immature Granulocytes: 0 %
Lymphocytes Relative: 25 %
Lymphs Abs: 1.5 10*3/uL (ref 0.7–4.0)
MCH: 32.9 pg (ref 26.0–34.0)
MCHC: 33.5 g/dL (ref 30.0–36.0)
MCV: 98 fL (ref 80.0–100.0)
Monocytes Absolute: 0.5 10*3/uL (ref 0.1–1.0)
Monocytes Relative: 9 %
Neutro Abs: 3.9 10*3/uL (ref 1.7–7.7)
Neutrophils Relative %: 63 %
Platelets: 143 10*3/uL — ABNORMAL LOW (ref 150–400)
RBC: 3.47 MIL/uL — ABNORMAL LOW (ref 4.22–5.81)
RDW: 12.3 % (ref 11.5–15.5)
WBC: 6.1 10*3/uL (ref 4.0–10.5)
nRBC: 0 % (ref 0.0–0.2)

## 2023-02-14 LAB — TROPONIN I (HIGH SENSITIVITY): Troponin I (High Sensitivity): 3 ng/L (ref <18)

## 2023-02-14 MED ORDER — DEXTROSE 50 % IV SOLN: 1.0000 | Freq: Once | INTRAVENOUS | Status: DC

## 2023-02-14 MED ORDER — INSULIN ASPART 100 UNIT/ML IJ SOLN: 5.0000 [IU] | Freq: Once | INTRAMUSCULAR | Status: DC

## 2023-02-14 MED ORDER — SODIUM ZIRCONIUM CYCLOSILICATE 10 G PO PACK: 10.0000 g | PACK | Freq: Once | ORAL | Status: DC

## 2023-02-14 NOTE — ED Provider Notes (Signed)
[place] EMERGENCY DEPARTMENT AT [facility] Provider Note   CSN: [id number] Arrival date & time: 02/14/23  6578     History  Chief Complaint  Patient presents with   Abnormal Labs    Alexander Booth is a 77 y.o. male.  HPI     Saw PCP because of chest pain, obtained blood work and found K to be high and sent to ED.  2-3L O2 at home  Hurts in left chest, sometimes feels like it is in back. Not constant, just every so often.  Has been going on for about 4 weeks.  Has had some falls, not sure if hurt it. Does not think it was exertional.  Had CXR that was ok at PCP.  Not worse with deep breaths.  No new cough, no new dyspnea. No nausea or vomiting. Has back problems, sees pain specialist, parkinson's, no new leg pain or swelling.  Does not take k, or eat sig bananas/sports drinks.  Has had K high in the past for unclear reasons    Past Medical History:  Diagnosis Date   Arthritis    COPD (chronic obstructive pulmonary disease) (HCC)    Depression    Dyspnea    Erectile dysfunction    He's had a hx of    Hypercholesterolemia    Hypertension    Essential   Lumbosacral disc disease    with chronic low back pain, has implanted nerve stimulator   Peripheral neuropathy    Presenile dementia    Type 2 diabetes mellitus (HCC) 2010   takes oral meds     Home Medications Prior to Admission medications   Medication Sig Start Date End Date Taking? Authorizing Provider  Accu-Chek Softclix Lancets lancets Check sugar 2 times daily. DX E11.8 06/06/21   Lorre Munroe, NP  acetaminophen (TYLENOL) 500 MG tablet Take 1,000 mg by mouth every 6 (six) hours as needed for moderate pain.    [provider]  Albuterol Sulfate (PROAIR RESPICLICK) 108 (90 Base) MCG/ACT AEPB Inhale 1-2 puffs into the lungs every 4 (four) hours as needed. Patient taking differently: Inhale 1-2 puffs into the lungs every 4 (four) hours as needed (shortness of breath). 05/10/19   Nyoka Cowden, MD  aspirin EC 81 MG tablet Take 81 mg by mouth daily with breakfast.    [provider]  atorvastatin (LIPITOR) 40 MG tablet Take 1 tablet (40 mg total) by mouth daily. 10/18/22   Lorre Munroe, NP  Blood Glucose Monitoring Suppl (ACCU-CHEK AVIVA PLUS) w/Device KIT Check sugar 2 times daily. DX E11.8 02/03/19   Lorre Munroe, NP  BREO ELLIPTA 100-25 MCG/INH AEPB INHALE 1 PUFF BY MOUTH EVERY DAY 01/17/21   Lorre Munroe, NP  carbidopa-levodopa (SINEMET IR) 25-100 MG tablet TAKE 1 TABLET THREE TIMES DAILY (AT 7AM, 11AM, 4PM) 01/24/23   Lorre Munroe, NP  Cholecalciferol (QC VITAMIN D3) 50 MCG (2000 UT) TABS Take 2,000 Units by mouth daily with lunch.    [provider]  ciprofloxacin (CIPRO) 500 MG tablet Take 1 tablet (500 mg total) by mouth 2 (two) times daily. One po bid x 7 days Patient not taking: Reported on 01/09/2023 10/17/22   Lorre Munroe, NP  citalopram (CELEXA) 20 MG tablet TAKE 1 TABLET EVERY DAY 11/12/22   Lorre Munroe, NP  cyclobenzaprine (FLEXERIL) 5 MG tablet Take 5 mg by mouth 2 (two) times daily.    [provider]  divalproex (DEPAKOTE  ER) 500 MG 24 hr tablet TAKE 2 TABLETS EVERY DAY 01/24/23   Lorre Munroe, NP  donepezil (ARICEPT) 10 MG tablet TAKE 1 TABLET EVERY DAY 01/24/23   Lorre Munroe, NP  gabapentin (NEURONTIN) 400 MG capsule TAKE 3 CAPSULES AT BEDTIME AND MAY REPEAT DOSE ONE TIME IF NEEDED 01/25/22   Lorre Munroe, NP  gentamicin cream (GARAMYCIN) 0.1 % Apply 1 Application topically 2 (two) times daily. Patient not taking: Reported on 01/09/2023 05/15/22   Felecia Shelling, DPM  glucose blood (ACCU-CHEK AVIVA PLUS) test strip Check sugar 2 times daily. DX E11.8 02/03/19   Lorre Munroe, NP  glucose blood (ACCU-CHEK GUIDE) test strip Use as instructed 06/06/21   Lorre Munroe, NP  lisinopril (ZESTRIL) 5 MG tablet TAKE 1 TABLET (5 MG TOTAL) BY MOUTH DAILY. 01/20/23   Lorre Munroe, NP  LORazepam (ATIVAN) 0.5 MG tablet TAKE 1  TO 2 TABLETS BY MOUTH TWICE A DAY AS NEEDED 01/24/23   Lorre Munroe, NP  meloxicam (MOBIC) 15 MG tablet TAKE 1 TABLET EVERY DAY WITH SUPPER 05/23/22   Lorre Munroe, NP  memantine (NAMENDA XR) 14 MG CP24 24 hr capsule Take 1 capsule (14 mg total) by mouth daily. 01/24/23   Lorre Munroe, NP  metFORMIN (GLUCOPHAGE) 500 MG tablet TAKE 1 TABLET TWICE DAILY WITH MEALS 12/24/22   Lorre Munroe, NP  mirtazapine (REMERON) 45 MG tablet TAKE 1 TABLET AT BEDTIME 12/24/22   Lorre Munroe, NP  nitroGLYCERIN (NITROSTAT) 0.4 MG SL tablet Place 0.4 mg under the tongue every 5 (five) minutes x 3 doses as needed for chest pain. 01/15/16   [provider]  pantoprazole (PROTONIX) 20 MG tablet Take 1 tablet (20 mg total) by mouth daily. 10/17/22   Lorre Munroe, NP  tamsulosin (FLOMAX) 0.4 MG CAPS capsule TAKE 1 CAPSULE BY MOUTH EVERY DAY 01/24/23   Lorre Munroe, NP  traZODone (DESYREL) 50 MG tablet TAKE 1 TO 2 TABLETS AT BEDTIME 11/18/22   Lorre Munroe, NP  vitamin C (ASCORBIC ACID) 500 MG tablet Take 500 mg by mouth daily with lunch.    [provider]  sildenafil (VIAGRA) 50 MG tablet Take 1 tablet (50 mg total) by mouth as needed. 06/18/11 10/15/11  Cassell Clement, MD      Allergies    Codeine    Review of Systems   Review of Systems  Physical Exam Updated Vital Signs BP 128/78 (BP Location: Right Arm)   Pulse 82   Temp 97.7 F (36.5 C) (Oral)   Resp 17   Ht 6' (1.829 m)   Wt 79.4 kg   SpO2 98%   BMI 23.73 kg/m  Physical Exam Vitals and nursing note reviewed.  Constitutional:      General: He is not in acute distress.    Appearance: He is well-developed. He is not diaphoretic.  HENT:     Head: Normocephalic and atraumatic.  Eyes:     Conjunctiva/sclera: Conjunctivae normal.  Cardiovascular:     Rate and Rhythm: Normal rate and regular rhythm.     Heart sounds: Normal heart sounds. No murmur heard.    No friction rub. No gallop.  Pulmonary:     Effort: Pulmonary  effort is normal. No respiratory distress.     Breath sounds: Normal breath sounds. No wheezing or rales.  Abdominal:     General: There is no distension.     Palpations: Abdomen is soft.  Tenderness: There is no abdominal tenderness. There is no guarding.  Musculoskeletal:     Cervical back: Normal range of motion.  Skin:    General: Skin is warm and dry.  Neurological:     Mental Status: He is alert and oriented to person, place, and time.     ED Results / Procedures / Treatments   Labs (all labs ordered are listed, but only abnormal results are displayed) Labs Reviewed  CBC WITH DIFFERENTIAL/PLATELET - Abnormal; Notable for the following components:      Result Value   RBC 3.47 (*)    Hemoglobin 11.4 (*)    HCT 34.0 (*)    Platelets 143 (*)    All other components within normal limits  COMPREHENSIVE METABOLIC PANEL - Abnormal; Notable for the following components:   Glucose, Bld 109 (*)    Total Protein 6.3 (*)    AST 10 (*)    All other components within normal limits  TROPONIN I (HIGH SENSITIVITY)  TROPONIN I (HIGH SENSITIVITY)    EKG EKG Interpretation Date/Time:  Friday February 14 2023 07:11:58 EDT Ventricular Rate:  81 PR Interval:  135 QRS Duration:  81 QT Interval:  381 QTC Calculation: 443 R Axis:   -63  Text Interpretation: Sinus or ectopic atrial rhythm Left anterior fascicular block Abnormal R-wave progression, late transition No significant change since last tracing Confirmed by Alvira Monday ([id number]) on 02/14/2023 8:01:13 AM  Radiology No results found.  Procedures Procedures    Medications Ordered in ED Medications - No data to display   ED Course/ Medical Decision Making/ A&P                              77 year old male with a history of COPD on 2 to 3 L of oxygen at baseline, hypertension, hyperlipidemia, Parkinson's disease, type 2 diabetes, dementia, who presents with concern for elevated K when he had labs done at his primary care  doctor's office when he had presented to see them for chest pain.  For his chest pain, EKG was completed and personally evaluated interpreted by me showed a sinus rhythm without acute ST changes or findings of pericarditis.  Reviewed the radiology read of his chest x-ray completed yesterday which showed no evidence of pneumonia, pneumothorax, pulmonary edema or mediastinal widening.  He does not have any increased shortness of breath, does not have pleuritic pain, or constant symptoms, no asymmetric leg swelling, and my clinical suspicion for PE at this time is low.  With his intermittent chest pain, will obtain troponin for further evaluation of cardiac etiology.  His PCP was referring him to cardiology.  Clinically low suspicion for aortic dissection.  Labs were repeated today and personally eval and interpreted by me showing hgb 11.4 from 12 yesterday. Not having acute bleeding symptoms, recommend recheck of this as outpatient.  Today, K is 5.0, normal Cr and GFR. Do not see indication for acute lowering, high-end normal K.  Discussed continue to try to avoid potassium containing foods and recommend outpatient follow-up.  Troponin negative. Have low suspicion for ACS. Intermittent, nonexertional pain.  Feel he is stable for outpatient follow-up--he has been referred to cardiology by his primary care physician, and agree with this referral.  Discussed reasons to return to the emergency department. Patient discharged in stable condition with understanding of reasons to return.         Final Clinical Impression(s) / ED Diagnoses Final  diagnoses:  Referred by primary care physician  Chest pain, unspecified type    Rx / DC Orders ED Discharge Orders     None         Alvira Monday, MD 02/14/23 1044

## 2023-02-14 NOTE — Telephone Encounter (Signed)
Gena Rehab Manager with [facility] states that they received a referral for pt PCP. Gena states that they are going to be delayed in seeing the pt, they reached the pt daughter today and she is requesting for them to come out on 02/19/2023.  Per Gena they will be going out to see the pt on Wednesday 02/19/2023.

## 2023-02-14 NOTE — ED Notes (Signed)
Called lab to add Troponin, spoke with Marchelle Folks.

## 2023-02-14 NOTE — ED Triage Notes (Signed)
In for eval of abnormal labs. Was seen at PCP on Wednesday for chest pain x4 weeks. Daughter reports she received a phone call that his potassium was 6.4 on the bloodwork. Patient awake and alert. Denies pain.

## 2023-02-20 ENCOUNTER — Telehealth: Payer: Self-pay | Admitting: *Deleted

## 2023-02-20 NOTE — Patient Outreach (Signed)
  Care Coordination   02/20/2023 Name: Alexander Booth MRN: [id number] DOB: 08/02/1945   Care Coordination Outreach Attempts:  An unsuccessful telephone outreach was attempted today to offer the patient information about available care coordination services.  Noted ED visit last week, call placed for follow up, no answer, message left.  Per chart, [facility] is active for home health as of yesterday.   Follow Up Plan:  No further outreach attempts will be made at this time. We have been unable to contact the patient to offer or enroll patient in care coordination services  Encounter Outcome:  No Answer   Care Coordination Interventions:  No, not indicated    Kemper Durie, RN, MSN, [facility] Care Management Care Management Coordinator [phone]

## 2023-02-21 ENCOUNTER — Telehealth: Payer: Self-pay

## 2023-02-21 DIAGNOSIS — F0284 Dementia in other diseases classified elsewhere, unspecified severity, with anxiety: Secondary | ICD-10-CM | POA: Diagnosis not present

## 2023-02-21 DIAGNOSIS — I10 Essential (primary) hypertension: Secondary | ICD-10-CM | POA: Diagnosis not present

## 2023-02-21 DIAGNOSIS — F0283 Dementia in other diseases classified elsewhere, unspecified severity, with mood disturbance: Secondary | ICD-10-CM | POA: Diagnosis not present

## 2023-02-21 DIAGNOSIS — I444 Left anterior fascicular block: Secondary | ICD-10-CM | POA: Diagnosis not present

## 2023-02-21 DIAGNOSIS — I700 Atherosclerosis of aorta: Secondary | ICD-10-CM | POA: Diagnosis not present

## 2023-02-21 DIAGNOSIS — E1142 Type 2 diabetes mellitus with diabetic polyneuropathy: Secondary | ICD-10-CM | POA: Diagnosis not present

## 2023-02-21 DIAGNOSIS — J449 Chronic obstructive pulmonary disease, unspecified: Secondary | ICD-10-CM | POA: Diagnosis not present

## 2023-02-21 DIAGNOSIS — F32A Depression, unspecified: Secondary | ICD-10-CM | POA: Diagnosis not present

## 2023-02-21 DIAGNOSIS — G20B2 Parkinson's disease with dyskinesia, with fluctuations: Secondary | ICD-10-CM | POA: Diagnosis not present

## 2023-02-21 NOTE — Telephone Encounter (Signed)
Transition Care Management Unsuccessful Follow-up Telephone Call  Date of discharge and from where:  [facility] 7/26  Attempts:  2nd Attempt  Reason for unsuccessful TCM follow-up call:  No answer/busy   Lenard Forth [facility] Guide, [facility] [phone] [address] [place], [place], [place] [address] Phone: [phone] Email: [email or web address]

## 2023-02-21 NOTE — Telephone Encounter (Signed)
Transition Care Management Unsuccessful Follow-up Telephone Call  Date of discharge and from where:  [facility] 7/26  Attempts:  1st Attempt  Reason for unsuccessful TCM follow-up call:  No answer/busy   Lenard Forth [facility] Guide, [facility] [phone] [address] [place], [place], [place] [address] Phone: [phone] Email: [email or web address]

## 2023-02-24 ENCOUNTER — Telehealth: Payer: Self-pay | Admitting: Internal Medicine

## 2023-02-24 DIAGNOSIS — E1142 Type 2 diabetes mellitus with diabetic polyneuropathy: Secondary | ICD-10-CM | POA: Diagnosis not present

## 2023-02-24 DIAGNOSIS — F0283 Dementia in other diseases classified elsewhere, unspecified severity, with mood disturbance: Secondary | ICD-10-CM | POA: Diagnosis not present

## 2023-02-24 DIAGNOSIS — G20B2 Parkinson's disease with dyskinesia, with fluctuations: Secondary | ICD-10-CM | POA: Diagnosis not present

## 2023-02-24 DIAGNOSIS — I10 Essential (primary) hypertension: Secondary | ICD-10-CM | POA: Diagnosis not present

## 2023-02-24 DIAGNOSIS — J449 Chronic obstructive pulmonary disease, unspecified: Secondary | ICD-10-CM | POA: Diagnosis not present

## 2023-02-24 DIAGNOSIS — F0284 Dementia in other diseases classified elsewhere, unspecified severity, with anxiety: Secondary | ICD-10-CM | POA: Diagnosis not present

## 2023-02-24 DIAGNOSIS — I444 Left anterior fascicular block: Secondary | ICD-10-CM | POA: Diagnosis not present

## 2023-02-24 DIAGNOSIS — I700 Atherosclerosis of aorta: Secondary | ICD-10-CM | POA: Diagnosis not present

## 2023-02-24 DIAGNOSIS — F32A Depression, unspecified: Secondary | ICD-10-CM | POA: Diagnosis not present

## 2023-02-24 NOTE — Telephone Encounter (Signed)
Home Health Verbal Orders - Caller/Agency: Cecilie Lowers Callback Number: [phone] Requesting frequency  PT  1 wk 1     2 wk 3     1 wk 4   Pt had level 2 medication interaction. donepezil (ARICEPT) 10 MG tablet and citalopram (CELEXA) 20 MG tablet  Ben wanted to make dr away.

## 2023-02-25 ENCOUNTER — Ambulatory Visit: Payer: Medicare HMO | Admitting: Podiatry

## 2023-02-25 ENCOUNTER — Encounter: Payer: Self-pay | Admitting: Podiatry

## 2023-02-25 DIAGNOSIS — M79671 Pain in right foot: Secondary | ICD-10-CM | POA: Diagnosis not present

## 2023-02-25 NOTE — Telephone Encounter (Signed)
Ok for verbal orders as requested. Aware of drug interaction.

## 2023-02-25 NOTE — Telephone Encounter (Signed)
Pt advised.   Thanks,   -  

## 2023-02-25 NOTE — Progress Notes (Signed)
Chief Complaint  Patient presents with   Foot Pain    "I have a place on my heel." N - pain heel L - medial heel right D - 6-8 weeks O - off and on C - gets blood red, aches, wakes me up A - pressure T - none    HPI: 77 y.o. male presenting today for new complaint of pain and tenderness associated to the medial aspect of the right heel.  Patient's daughter states that the heel becomes 'blood red' throughout the day although she states that there is no pressure that is applied to the heel.  This has been ongoing for few months now.  Denies any history of injury.  Past Medical History:  Diagnosis Date   Arthritis    COPD (chronic obstructive pulmonary disease) (HCC)    Depression    Dyspnea    Erectile dysfunction    He's had a hx of    Hypercholesterolemia    Hypertension    Essential   Lumbosacral disc disease    with chronic low back pain, has implanted nerve stimulator   Peripheral neuropathy    Presenile dementia    Type 2 diabetes mellitus (HCC) 2010   takes oral meds    Past Surgical History:  Procedure Laterality Date   ANTERIOR CERVICAL DECOMP/DISCECTOMY FUSION     APPENDECTOMY     HEMORRHOID SURGERY     x2   JOINT REPLACEMENT Left    2nd one was to change siZe   KNEE SURGERY     Previous left total replacement   KYPHOPLASTY N/A 12/10/2012   Procedure: Lumbar one Kyphoplasty;  Surgeon: Barnett Abu, MD;  Location: [facility];  Service: Neurosurgery;  Laterality: N/A;  Lumbar One Kyphoplasty   LUMBAR LAMINECTOMY/DECOMPRESSION MICRODISCECTOMY Bilateral 01/25/2021   Procedure: Bilateral Lumbar 4-5, Lumbar 5 Sacral 1 Laminectomy/Foraminotomy;  Surgeon: Maeola Harman, MD;  Location: [facility] OR;  Service: Neurosurgery;  Laterality: Bilateral;  3C/RM 21   OTHER SURGICAL HISTORY  1992   Bone Fusion in Right Foot   SPINAL CORD STIMULATOR INSERTION N/A 12/02/2014   Procedure: THORACIC SPINAL CORD STIMULATOR INSERTION INFINION BOSTON SCIENTIFIC 16 LEAD WITH LAMINECTOMY;   Surgeon: Maeola Harman, MD;  Location: [facility];  Service: Neurosurgery;  Laterality: N/A;  THORACIC SPINAL CORD STIMULATOR INSERTION INFINION BOSTON SCIENTIFIC 16 LEAD WITH LAMINECTOMY   SPINAL CORD STIMULATOR REMOVAL N/A 10/19/2020   Procedure: Removal of spinal cord stimulator with paddle lead;  Surgeon: Maeola Harman, MD;  Location: [facility] OR;  Service: Neurosurgery;  Laterality: N/A;   TOTAL KNEE ARTHROPLASTY Left     Allergies  Allergen Reactions   Codeine Hives and Itching     Physical Exam: General: The patient is alert and oriented x3 in no acute distress.  Dermatology: Skin is warm, dry and supple bilateral lower extremities.  No open wounds noted  Vascular: Palpable pedal pulses bilaterally. Capillary refill within normal limits.  No appreciable edema.  No erythema.  Neurological: Grossly intact via light touch  Musculoskeletal Exam: No pedal deformities noted.  Tenderness associated to the medial aspect of the right heel  Assessment/Plan of Care: 1.  Pain and tenderness associated to the right medial heel  -Recommend topical diclofenac OTC -Stressed the importance of ensuring that there is no pressure applied to the medial heel when he sleeps or lays down as well as irritation from shoes although he generally does not wear shoes -Return to clinic as needed     [employer]  Arlyce Dice, DPM [facility]  Dr. Felecia Shelling, DPM    [address] [place], [place] [address]                Office [phone]  Fax [phone]

## 2023-02-26 ENCOUNTER — Other Ambulatory Visit: Payer: Self-pay | Admitting: Internal Medicine

## 2023-02-26 DIAGNOSIS — E1142 Type 2 diabetes mellitus with diabetic polyneuropathy: Secondary | ICD-10-CM | POA: Diagnosis not present

## 2023-02-26 DIAGNOSIS — J449 Chronic obstructive pulmonary disease, unspecified: Secondary | ICD-10-CM | POA: Diagnosis not present

## 2023-02-26 DIAGNOSIS — G20B2 Parkinson's disease with dyskinesia, with fluctuations: Secondary | ICD-10-CM | POA: Diagnosis not present

## 2023-02-26 DIAGNOSIS — I700 Atherosclerosis of aorta: Secondary | ICD-10-CM | POA: Diagnosis not present

## 2023-02-26 DIAGNOSIS — F0284 Dementia in other diseases classified elsewhere, unspecified severity, with anxiety: Secondary | ICD-10-CM | POA: Diagnosis not present

## 2023-02-26 DIAGNOSIS — F0283 Dementia in other diseases classified elsewhere, unspecified severity, with mood disturbance: Secondary | ICD-10-CM | POA: Diagnosis not present

## 2023-02-26 DIAGNOSIS — I10 Essential (primary) hypertension: Secondary | ICD-10-CM | POA: Diagnosis not present

## 2023-02-26 DIAGNOSIS — I444 Left anterior fascicular block: Secondary | ICD-10-CM | POA: Diagnosis not present

## 2023-02-26 DIAGNOSIS — F32A Depression, unspecified: Secondary | ICD-10-CM | POA: Diagnosis not present

## 2023-02-27 NOTE — Telephone Encounter (Signed)
Request for memantine is too soon. Last refill 01/24/23 for 90 days.  Requested Prescriptions  Pending Prescriptions Disp Refills   LORazepam (ATIVAN) 0.5 MG tablet [Pharmacy Med Name: LORAZEPAM 0.5 MG TABLET] 45 tablet 0    Sig: TAKE 1 TO 2 TABLETS BY MOUTH TWICE A DAY AS NEEDED     Not Delegated - Psychiatry: Anxiolytics/Hypnotics 2 Failed - 02/26/2023 11:45 AM      Failed - This refill cannot be delegated      Failed - Urine Drug Screen completed in last 360 days      Passed - Patient is not pregnant      Passed - Valid encounter within last 6 months    Recent Outpatient Visits           2 weeks ago Left-sided chest pain   [place] [facility] [place], Salvadore Oxford, NP   4 months ago Encounter for general adult medical examination with abnormal findings   [place] [facility] [place], Salvadore Oxford, NP   6 months ago 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[facility])   [place] [facility] [place], Salvadore Oxford, NP   1 year ago Encounter for general adult medical examination with abnormal findings   [place] [facility] [place] [place], Salvadore Oxford, NP   1 year ago Aortic atherosclerosis [facility])   [place] [facility] [place], Salvadore Oxford, NP       Future Appointments             In 1 month [place], Salvadore Oxford, NP [place] [facility]             memantine (NAMENDA XR) 14 MG CP24 24 hr capsule [Pharmacy Med Name: MEMANTINE HCL ER 14 MG CAPSULE] 90 capsule 0    Sig: TAKE 1 CAPSULE (14 MG TOTAL) BY MOUTH DAILY.     Neurology:  Alzheimer's Agents 2 Passed - 02/26/2023 11:45 AM      Passed - Cr in normal range and within 360 days    Creat  Date Value Ref Range Status  02/12/2023 1.15 0.70 - 1.28 mg/dL Final   Creatinine, Ser  Date Value Ref Range Status  02/14/2023 1.10 0.61 - 1.24 mg/dL Final   Creatinine,U  Date Value Ref Range Status  03/20/2017 115.3 mg/dL Final   Creatinine, Urine  Date  Value Ref Range Status  09/20/2021 209 20 - 320 mg/dL Final         Passed - eGFR is 5 or above and within 360 days    GFR calc Af Amer  Date Value Ref Range Status  12/25/2019 >60 >60 mL/min Final   GFR, Estimated  Date Value Ref Range Status  02/14/2023 >60 >60 mL/min Final    Comment:    (NOTE) Calculated using the CKD-EPI Creatinine Equation (2021)    GFR  Date Value Ref Range Status  01/06/2020 59.40 (L) >60.00 mL/min Final   eGFR  Date Value Ref Range Status  02/12/2023 65 > OR = 60 mL/min/1.63m2 Final         Passed - Valid encounter within last 6 months    Recent Outpatient Visits           2 weeks ago Left-sided chest pain   [place] [facility] [place], Salvadore Oxford, NP   4 months ago Encounter for general adult medical examination with abnormal findings   [place] [facility] [place], Salvadore Oxford, NP   6 months ago 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[facility])   [place] [facility] [place], Salvadore Oxford, NP   1 year ago Encounter for general adult medical examination with abnormal findings   [place] [facility] [place], Salvadore Oxford, NP   1 year ago Aortic atherosclerosis [facility])   [place] [facility] [place], Salvadore Oxford, NP       Future Appointments             In 1 month [place], Salvadore Oxford, NP [facility], [facility]

## 2023-02-27 NOTE — Telephone Encounter (Signed)
Requested medication (s) are due for refill today: yes  Requested medication (s) are on the active medication list: yes  Last refill:  01/24/23  Future visit scheduled: yes  Notes to clinic:  Unable to refill per protocol, cannot delegate.      Requested Prescriptions  Pending Prescriptions Disp Refills   LORazepam (ATIVAN) 0.5 MG tablet [Pharmacy Med Name: LORAZEPAM 0.5 MG TABLET] 45 tablet 0    Sig: TAKE 1 TO 2 TABLETS BY MOUTH TWICE A DAY AS NEEDED     Not Delegated - Psychiatry: Anxiolytics/Hypnotics 2 Failed - 02/26/2023 11:45 AM      Failed - This refill cannot be delegated      Failed - Urine Drug Screen completed in last 360 days      Passed - Patient is not pregnant      Passed - Valid encounter within last 6 months    Recent Outpatient Visits           2 weeks ago Left-sided chest pain   [place] [facility] [place], Salvadore Oxford, NP   4 months ago Encounter for general adult medical examination with abnormal findings   [place] [facility] [place], Salvadore Oxford, NP   6 months ago 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[facility])   [place] [facility] [place], Salvadore Oxford, NP   1 year ago Encounter for general adult medical examination with abnormal findings   [place] [facility] [place], Salvadore Oxford, NP   1 year ago Aortic atherosclerosis [facility])   [place] [facility] [place], Salvadore Oxford, NP       Future Appointments             In 1 month [place], Salvadore Oxford, NP [place] [facility], [facility]            Refused Prescriptions Disp Refills   memantine (NAMENDA XR) 14 MG CP24 24 hr capsule [Pharmacy Med Name: MEMANTINE HCL ER 14 MG CAPSULE] 90 capsule 0    Sig: TAKE 1 CAPSULE (14 MG TOTAL) BY MOUTH DAILY.     Neurology:  Alzheimer's Agents 2 Passed - 02/26/2023 11:45 AM      Passed - Cr in normal range and within 360 days    Creat  Date Value Ref Range Status  02/12/2023 1.15 0.70  - 1.28 mg/dL Final   Creatinine, Ser  Date Value Ref Range Status  02/14/2023 1.10 0.61 - 1.24 mg/dL Final   Creatinine,U  Date Value Ref Range Status  03/20/2017 115.3 mg/dL Final   Creatinine, Urine  Date Value Ref Range Status  09/20/2021 209 20 - 320 mg/dL Final         Passed - eGFR is 5 or above and within 360 days    GFR calc Af Amer  Date Value Ref Range Status  12/25/2019 >60 >60 mL/min Final   GFR, Estimated  Date Value Ref Range Status  02/14/2023 >60 >60 mL/min Final    Comment:    (NOTE) Calculated using the CKD-EPI Creatinine Equation (2021)    GFR  Date Value Ref Range Status  01/06/2020 59.40 (L) >60.00 mL/min Final   eGFR  Date Value Ref Range Status  02/12/2023 65 > OR = 60 mL/min/1.84m2 Final         Passed - Valid encounter within last 6 months    Recent Outpatient Visits  2 weeks ago Left-sided chest pain   [place] [facility] [place], Salvadore Oxford, NP   4 months ago Encounter for general adult medical examination with abnormal findings   [place] [place], Salvadore Oxford, NP   6 months ago 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[facility])   [place] [facility] [place], Salvadore Oxford, NP   1 year ago Encounter for general adult medical examination with abnormal findings   [place] [facility] [place], Salvadore Oxford, NP   1 year ago Aortic atherosclerosis [facility])   [place] [facility] [place], Salvadore Oxford, NP       Future Appointments             In 1 month [place], Salvadore Oxford, NP [place] [facility], [facility]

## 2023-03-04 DIAGNOSIS — I444 Left anterior fascicular block: Secondary | ICD-10-CM | POA: Diagnosis not present

## 2023-03-04 DIAGNOSIS — G20B2 Parkinson's disease with dyskinesia, with fluctuations: Secondary | ICD-10-CM | POA: Diagnosis not present

## 2023-03-04 DIAGNOSIS — J449 Chronic obstructive pulmonary disease, unspecified: Secondary | ICD-10-CM | POA: Diagnosis not present

## 2023-03-04 DIAGNOSIS — F0284 Dementia in other diseases classified elsewhere, unspecified severity, with anxiety: Secondary | ICD-10-CM | POA: Diagnosis not present

## 2023-03-04 DIAGNOSIS — F32A Depression, unspecified: Secondary | ICD-10-CM | POA: Diagnosis not present

## 2023-03-04 DIAGNOSIS — E1142 Type 2 diabetes mellitus with diabetic polyneuropathy: Secondary | ICD-10-CM | POA: Diagnosis not present

## 2023-03-04 DIAGNOSIS — I700 Atherosclerosis of aorta: Secondary | ICD-10-CM | POA: Diagnosis not present

## 2023-03-04 DIAGNOSIS — F0283 Dementia in other diseases classified elsewhere, unspecified severity, with mood disturbance: Secondary | ICD-10-CM | POA: Diagnosis not present

## 2023-03-04 DIAGNOSIS — I10 Essential (primary) hypertension: Secondary | ICD-10-CM | POA: Diagnosis not present

## 2023-03-07 DIAGNOSIS — E1142 Type 2 diabetes mellitus with diabetic polyneuropathy: Secondary | ICD-10-CM | POA: Diagnosis not present

## 2023-03-07 DIAGNOSIS — F0283 Dementia in other diseases classified elsewhere, unspecified severity, with mood disturbance: Secondary | ICD-10-CM | POA: Diagnosis not present

## 2023-03-07 DIAGNOSIS — F0284 Dementia in other diseases classified elsewhere, unspecified severity, with anxiety: Secondary | ICD-10-CM | POA: Diagnosis not present

## 2023-03-07 DIAGNOSIS — J449 Chronic obstructive pulmonary disease, unspecified: Secondary | ICD-10-CM | POA: Diagnosis not present

## 2023-03-07 DIAGNOSIS — I700 Atherosclerosis of aorta: Secondary | ICD-10-CM | POA: Diagnosis not present

## 2023-03-07 DIAGNOSIS — I10 Essential (primary) hypertension: Secondary | ICD-10-CM | POA: Diagnosis not present

## 2023-03-07 DIAGNOSIS — I444 Left anterior fascicular block: Secondary | ICD-10-CM | POA: Diagnosis not present

## 2023-03-07 DIAGNOSIS — G20B2 Parkinson's disease with dyskinesia, with fluctuations: Secondary | ICD-10-CM | POA: Diagnosis not present

## 2023-03-07 DIAGNOSIS — F32A Depression, unspecified: Secondary | ICD-10-CM | POA: Diagnosis not present

## 2023-03-11 DIAGNOSIS — G20B2 Parkinson's disease with dyskinesia, with fluctuations: Secondary | ICD-10-CM | POA: Diagnosis not present

## 2023-03-11 DIAGNOSIS — E1142 Type 2 diabetes mellitus with diabetic polyneuropathy: Secondary | ICD-10-CM | POA: Diagnosis not present

## 2023-03-11 DIAGNOSIS — I444 Left anterior fascicular block: Secondary | ICD-10-CM | POA: Diagnosis not present

## 2023-03-11 DIAGNOSIS — F0284 Dementia in other diseases classified elsewhere, unspecified severity, with anxiety: Secondary | ICD-10-CM | POA: Diagnosis not present

## 2023-03-11 DIAGNOSIS — I700 Atherosclerosis of aorta: Secondary | ICD-10-CM | POA: Diagnosis not present

## 2023-03-11 DIAGNOSIS — F0283 Dementia in other diseases classified elsewhere, unspecified severity, with mood disturbance: Secondary | ICD-10-CM | POA: Diagnosis not present

## 2023-03-11 DIAGNOSIS — I10 Essential (primary) hypertension: Secondary | ICD-10-CM | POA: Diagnosis not present

## 2023-03-11 DIAGNOSIS — F32A Depression, unspecified: Secondary | ICD-10-CM | POA: Diagnosis not present

## 2023-03-11 DIAGNOSIS — J449 Chronic obstructive pulmonary disease, unspecified: Secondary | ICD-10-CM | POA: Diagnosis not present

## 2023-03-13 DIAGNOSIS — J449 Chronic obstructive pulmonary disease, unspecified: Secondary | ICD-10-CM | POA: Diagnosis not present

## 2023-03-13 DIAGNOSIS — F0284 Dementia in other diseases classified elsewhere, unspecified severity, with anxiety: Secondary | ICD-10-CM | POA: Diagnosis not present

## 2023-03-13 DIAGNOSIS — I444 Left anterior fascicular block: Secondary | ICD-10-CM | POA: Diagnosis not present

## 2023-03-13 DIAGNOSIS — I10 Essential (primary) hypertension: Secondary | ICD-10-CM | POA: Diagnosis not present

## 2023-03-13 DIAGNOSIS — F32A Depression, unspecified: Secondary | ICD-10-CM | POA: Diagnosis not present

## 2023-03-13 DIAGNOSIS — I700 Atherosclerosis of aorta: Secondary | ICD-10-CM | POA: Diagnosis not present

## 2023-03-13 DIAGNOSIS — F0283 Dementia in other diseases classified elsewhere, unspecified severity, with mood disturbance: Secondary | ICD-10-CM | POA: Diagnosis not present

## 2023-03-13 DIAGNOSIS — G20B2 Parkinson's disease with dyskinesia, with fluctuations: Secondary | ICD-10-CM | POA: Diagnosis not present

## 2023-03-13 DIAGNOSIS — E1142 Type 2 diabetes mellitus with diabetic polyneuropathy: Secondary | ICD-10-CM | POA: Diagnosis not present

## 2023-03-21 DIAGNOSIS — I10 Essential (primary) hypertension: Secondary | ICD-10-CM | POA: Diagnosis not present

## 2023-03-21 DIAGNOSIS — J449 Chronic obstructive pulmonary disease, unspecified: Secondary | ICD-10-CM | POA: Diagnosis not present

## 2023-03-21 DIAGNOSIS — G20B2 Parkinson's disease with dyskinesia, with fluctuations: Secondary | ICD-10-CM | POA: Diagnosis not present

## 2023-03-21 DIAGNOSIS — F0284 Dementia in other diseases classified elsewhere, unspecified severity, with anxiety: Secondary | ICD-10-CM | POA: Diagnosis not present

## 2023-03-21 DIAGNOSIS — F32A Depression, unspecified: Secondary | ICD-10-CM | POA: Diagnosis not present

## 2023-03-21 DIAGNOSIS — F0283 Dementia in other diseases classified elsewhere, unspecified severity, with mood disturbance: Secondary | ICD-10-CM | POA: Diagnosis not present

## 2023-03-21 DIAGNOSIS — I444 Left anterior fascicular block: Secondary | ICD-10-CM | POA: Diagnosis not present

## 2023-03-21 DIAGNOSIS — E1142 Type 2 diabetes mellitus with diabetic polyneuropathy: Secondary | ICD-10-CM | POA: Diagnosis not present

## 2023-03-21 DIAGNOSIS — I700 Atherosclerosis of aorta: Secondary | ICD-10-CM | POA: Diagnosis not present

## 2023-03-26 ENCOUNTER — Other Ambulatory Visit: Payer: Self-pay | Admitting: Internal Medicine

## 2023-03-26 DIAGNOSIS — J449 Chronic obstructive pulmonary disease, unspecified: Secondary | ICD-10-CM | POA: Diagnosis not present

## 2023-03-26 DIAGNOSIS — I10 Essential (primary) hypertension: Secondary | ICD-10-CM | POA: Diagnosis not present

## 2023-03-26 DIAGNOSIS — I700 Atherosclerosis of aorta: Secondary | ICD-10-CM | POA: Diagnosis not present

## 2023-03-26 DIAGNOSIS — R296 Repeated falls: Secondary | ICD-10-CM | POA: Diagnosis not present

## 2023-03-26 DIAGNOSIS — F1120 Opioid dependence, uncomplicated: Secondary | ICD-10-CM | POA: Diagnosis not present

## 2023-03-26 DIAGNOSIS — I444 Left anterior fascicular block: Secondary | ICD-10-CM | POA: Diagnosis not present

## 2023-03-26 DIAGNOSIS — G20A1 Parkinson's disease without dyskinesia, without mention of fluctuations: Secondary | ICD-10-CM | POA: Diagnosis not present

## 2023-03-26 DIAGNOSIS — F0283 Dementia in other diseases classified elsewhere, unspecified severity, with mood disturbance: Secondary | ICD-10-CM | POA: Diagnosis not present

## 2023-03-26 DIAGNOSIS — G20B2 Parkinson's disease with dyskinesia, with fluctuations: Secondary | ICD-10-CM | POA: Diagnosis not present

## 2023-03-26 DIAGNOSIS — M5416 Radiculopathy, lumbar region: Secondary | ICD-10-CM | POA: Diagnosis not present

## 2023-03-26 DIAGNOSIS — E1142 Type 2 diabetes mellitus with diabetic polyneuropathy: Secondary | ICD-10-CM | POA: Diagnosis not present

## 2023-03-26 DIAGNOSIS — F0284 Dementia in other diseases classified elsewhere, unspecified severity, with anxiety: Secondary | ICD-10-CM | POA: Diagnosis not present

## 2023-03-26 DIAGNOSIS — F32A Depression, unspecified: Secondary | ICD-10-CM | POA: Diagnosis not present

## 2023-03-27 NOTE — Telephone Encounter (Signed)
Requested Prescriptions  Pending Prescriptions Disp Refills   atorvastatin (LIPITOR) 40 MG tablet [Pharmacy Med Name: Atorvastatin Calcium Oral Tablet 40 MG] 90 tablet 0    Sig: TAKE 1 TABLET EVERY DAY     Cardiovascular:  Antilipid - Statins Failed - 03/26/2023 11:25 AM      Failed - Lipid Panel in normal range within the last 12 months    Cholesterol  Date Value Ref Range Status  10/17/2022 140 <200 mg/dL Final   LDL Cholesterol (Calc)  Date Value Ref Range Status  10/17/2022 71 mg/dL (calc) Final    Comment:    Reference range: <100 . Desirable range <100 mg/dL for primary prevention;   <70 mg/dL for patients with CHD or diabetic patients  with > or = 2 CHD risk factors. Marland Kitchen LDL-C is now calculated using the Martin-Hopkins  calculation, which is a validated novel method providing  better accuracy than the Friedewald equation in the  estimation of LDL-C.  Horald Pollen et al. Lenox Ahr. 4782;956(21): 2061-2068  (http://education.QuestDiagnostics.com/faq/FAQ164)    Direct LDL  Date Value Ref Range Status  04/27/2019 92.0 mg/dL Final    Comment:    Optimal:  <100 mg/dLNear or Above Optimal:  100-129 mg/dLBorderline High:  130-159 mg/dLHigh:  160-189 mg/dLVery High:  >190 mg/dL   HDL  Date Value Ref Range Status  10/17/2022 36 (L) > OR = 40 mg/dL Final   Triglycerides  Date Value Ref Range Status  10/17/2022 252 (H) <150 mg/dL Final    Comment:    . If a non-fasting specimen was collected, consider repeat triglyceride testing on a fasting specimen if clinically indicated.  Perry Mount et al. J. of Clin. Lipidol. 2015;9:129-169. Marland Kitchen          Passed - Patient is not pregnant      Passed - Valid encounter within last 12 months    Recent Outpatient Visits           1 month ago Left-sided chest pain   [place] [facility] [place], Salvadore Oxford, NP   5 months ago Encounter for general adult medical examination with abnormal findings   [place] [facility] [place], Salvadore Oxford, NP   7 months ago 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[facility])   [place] [facility] [place], Salvadore Oxford, NP   1 year ago Encounter for general adult medical examination with abnormal findings   [place] [facility] [place], Salvadore Oxford, NP   1 year ago Aortic atherosclerosis [facility])   [place] [facility] [place], Salvadore Oxford, NP       Future Appointments             In 1 week [place], Salvadore Oxford, NP [place] [facility], [facility]

## 2023-04-03 ENCOUNTER — Other Ambulatory Visit: Payer: Self-pay | Admitting: Internal Medicine

## 2023-04-04 NOTE — Telephone Encounter (Signed)
Requested medication (s) are due for refill today: routing for approval  Requested medication (s) are on the active medication list: yes  Last refill:  02/27/23  Future visit scheduled: yes  Notes to clinic:  Unable to refill per protocol, cannot delegate.      Requested Prescriptions  Pending Prescriptions Disp Refills   LORazepam (ATIVAN) 0.5 MG tablet [Pharmacy Med Name: LORAZEPAM 0.5 MG TABLET] 45 tablet 0    Sig: TAKE 1 TO 2 TABLETS BY MOUTH TWICE A DAY AS NEEDED     Not Delegated - Psychiatry: Anxiolytics/Hypnotics 2 Failed - 04/03/2023  2:26 PM      Failed - This refill cannot be delegated      Failed - Urine Drug Screen completed in last 360 days      Passed - Patient is not pregnant      Passed - Valid encounter within last 6 months    Recent Outpatient Visits           1 month ago Left-sided chest pain   [place] [facility] [place], Salvadore Oxford, NP   5 months ago Encounter for general adult medical examination with abnormal findings   [place] [facility] [place], Salvadore Oxford, NP   7 months ago 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[facility])   [place] [facility] [place], Salvadore Oxford, NP   1 year ago Encounter for general adult medical examination with abnormal findings   [place] [facility] [place], Salvadore Oxford, NP   1 year ago Aortic atherosclerosis [facility])   [place] [facility] [place], Salvadore Oxford, NP              Signed Prescriptions Disp Refills   memantine (NAMENDA XR) 14 MG CP24 24 hr capsule 90 capsule 0    Sig: TAKE 1 CAPSULE (14 MG TOTAL) BY MOUTH DAILY.     Neurology:  Alzheimer's Agents 2 Passed - 04/03/2023  2:26 PM      Passed - Cr in normal range and within 360 days    Creat  Date Value Ref Range Status  02/12/2023 1.15 0.70 - 1.28 mg/dL Final   Creatinine, Ser  Date Value Ref Range Status  02/14/2023 1.10 0.61 - 1.24 mg/dL Final   Creatinine,U  Date Value Ref Range  Status  03/20/2017 115.3 mg/dL Final   Creatinine, Urine  Date Value Ref Range Status  09/20/2021 209 20 - 320 mg/dL Final         Passed - eGFR is 5 or above and within 360 days    GFR calc Af Amer  Date Value Ref Range Status  12/25/2019 >60 >60 mL/min Final   GFR, Estimated  Date Value Ref Range Status  02/14/2023 >60 >60 mL/min Final    Comment:    (NOTE) Calculated using the CKD-EPI Creatinine Equation (2021)    GFR  Date Value Ref Range Status  01/06/2020 59.40 (L) >60.00 mL/min Final   eGFR  Date Value Ref Range Status  02/12/2023 65 > OR = 60 mL/min/1.39m2 Final         Passed - Valid encounter within last 6 months    Recent Outpatient Visits           1 month ago Left-sided chest pain   [place] [facility] [place], Salvadore Oxford, NP   5 months ago Encounter for general adult medical examination with abnormal findings   [facility] [place], Salvadore Oxford, NP   7 months ago 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[facility])   [place] [facility] [place], Salvadore Oxford, NP   1 year ago Encounter for general adult medical examination with abnormal findings   [place] [facility] [place], Salvadore Oxford, NP   1 year ago Aortic atherosclerosis [facility])    [facility] [place], Salvadore Oxford, [place]

## 2023-04-04 NOTE — Telephone Encounter (Signed)
Requested Prescriptions  Pending Prescriptions Disp Refills   memantine (NAMENDA XR) 14 MG CP24 24 hr capsule [Pharmacy Med Name: MEMANTINE HCL ER 14 MG CAPSULE] 90 capsule 0    Sig: TAKE 1 CAPSULE (14 MG TOTAL) BY MOUTH DAILY.     Neurology:  Alzheimer's Agents 2 Passed - 04/03/2023  2:26 PM      Passed - Cr in normal range and within 360 days    Creat  Date Value Ref Range Status  02/12/2023 1.15 0.70 - 1.28 mg/dL Final   Creatinine, Ser  Date Value Ref Range Status  02/14/2023 1.10 0.61 - 1.24 mg/dL Final   Creatinine,U  Date Value Ref Range Status  03/20/2017 115.3 mg/dL Final   Creatinine, Urine  Date Value Ref Range Status  09/20/2021 209 20 - 320 mg/dL Final         Passed - eGFR is 5 or above and within 360 days    GFR calc Af Amer  Date Value Ref Range Status  12/25/2019 >60 >60 mL/min Final   GFR, Estimated  Date Value Ref Range Status  02/14/2023 >60 >60 mL/min Final    Comment:    (NOTE) Calculated using the CKD-EPI Creatinine Equation (2021)    GFR  Date Value Ref Range Status  01/06/2020 59.40 (L) >60.00 mL/min Final   eGFR  Date Value Ref Range Status  02/12/2023 65 > OR = 60 mL/min/1.22m2 Final         Passed - Valid encounter within last 6 months    Recent Outpatient Visits           1 month ago Left-sided chest pain   [place] [facility] [place], Salvadore Oxford, NP   5 months ago Encounter for general adult medical examination with abnormal findings   [place] [facility] [place], Salvadore Oxford, NP   7 months ago 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[facility])   [place] [facility] [place], Salvadore Oxford, NP   1 year ago Encounter for general adult medical examination with abnormal findings   [place] [facility] [place], Salvadore Oxford, NP   1 year ago Aortic atherosclerosis [facility])   [place] [facility] [place], [place] W, NP               LORazepam (ATIVAN) 0.5 MG tablet  [Pharmacy Med Name: LORAZEPAM 0.5 MG TABLET] 45 tablet 0    Sig: TAKE 1 TO 2 TABLETS BY MOUTH TWICE A DAY AS NEEDED     Not Delegated - Psychiatry: Anxiolytics/Hypnotics 2 Failed - 04/03/2023  2:26 PM      Failed - This refill cannot be delegated      Failed - Urine Drug Screen completed in last 360 days      Passed - Patient is not pregnant      Passed - Valid encounter within last 6 months    Recent Outpatient Visits           1 month ago Left-sided chest pain   [place] [facility] [place], Salvadore Oxford, NP   5 months ago Encounter for general adult medical examination with abnormal findings   [place] [facility] [place], Salvadore Oxford, NP   7 months ago 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[facility])    [facility] [place], Salvadore Oxford, NP   1 year ago Encounter for general adult medical examination with abnormal findings  [place] [facility] [place], Salvadore Oxford, NP   1 year ago Aortic atherosclerosis [facility])   [place] [facility] [place], Salvadore Oxford, [place]

## 2023-04-08 ENCOUNTER — Ambulatory Visit: Payer: Medicare HMO | Admitting: Internal Medicine

## 2023-04-10 ENCOUNTER — Other Ambulatory Visit: Payer: Self-pay | Admitting: Internal Medicine

## 2023-04-10 DIAGNOSIS — F03918 Unspecified dementia, unspecified severity, with other behavioral disturbance: Secondary | ICD-10-CM

## 2023-04-10 DIAGNOSIS — F32A Depression, unspecified: Secondary | ICD-10-CM

## 2023-04-10 DIAGNOSIS — G4700 Insomnia, unspecified: Secondary | ICD-10-CM

## 2023-04-10 DIAGNOSIS — G20A1 Parkinson's disease without dyskinesia, without mention of fluctuations: Secondary | ICD-10-CM

## 2023-04-11 ENCOUNTER — Other Ambulatory Visit: Payer: Self-pay | Admitting: Internal Medicine

## 2023-04-11 NOTE — Telephone Encounter (Signed)
Requested Prescriptions  Pending Prescriptions Disp Refills   traZODone (DESYREL) 50 MG tablet [Pharmacy Med Name: traZODone HCl Oral Tablet 50 MG] 180 tablet 0    Sig: TAKE 1 TO 2 TABLETS AT BEDTIME     Psychiatry: Antidepressants - Serotonin Modulator Passed - 04/10/2023  2:44 AM      Passed - Valid encounter within last 6 months    Recent Outpatient Visits           1 month ago Left-sided chest pain   [facility] [place], Salvadore Oxford, NP   5 months ago Encounter for general adult medical examination with abnormal findings   [facility] [place], Salvadore Oxford, NP   7 months ago 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[facility])   [facility] [place], Salvadore Oxford, NP   1 year ago Encounter for general adult medical examination with abnormal findings   [place] [place], Salvadore Oxford, NP   1 year ago Aortic atherosclerosis [facility])   [place] [facility] [place], Salvadore Oxford, NP               carbidopa-levodopa (SINEMET IR) 25-100 MG tablet [Pharmacy Med Name: Carbidopa-Levodopa Oral Tablet 25-100 MG] 270 tablet 0    Sig: TAKE 1 TABLET THREE TIMES DAILY (AT 7AM, 11AM, 4PM)     Neurology:  Parkinsonian Agents 2 Failed - 04/10/2023  2:44 AM      Failed - PLT in normal range and within 360 days    Platelets  Date Value Ref Range Status  02/14/2023 143 (L) 150 - 400 K/uL Final         Failed - HGB in normal range and within 360 days    Hemoglobin  Date Value Ref Range Status  02/14/2023 11.4 (L) 13.0 - 17.0 g/dL Final         Failed - HCT in normal range and within 360 days    HCT  Date Value Ref Range Status  02/14/2023 34.0 (L) 39.0 - 52.0 % Final         Failed - AST in normal range and within 360 days    AST  Date Value Ref Range Status  02/14/2023 10 (L) 15 - 41 U/L Final         Passed - WBC in normal range and within 360 days    WBC  Date Value Ref Range Status   02/14/2023 6.1 4.0 - 10.5 K/uL Final         Passed - Cr in normal range and within 360 days    Creat  Date Value Ref Range Status  02/12/2023 1.15 0.70 - 1.28 mg/dL Final   Creatinine, Ser  Date Value Ref Range Status  02/14/2023 1.10 0.61 - 1.24 mg/dL Final   Creatinine,U  Date Value Ref Range Status  03/20/2017 115.3 mg/dL Final   Creatinine, Urine  Date Value Ref Range Status  09/20/2021 209 20 - 320 mg/dL Final         Passed - ALT in normal range and within 360 days    ALT  Date Value Ref Range Status  02/14/2023 <5 0 - 44 U/L Final         Passed - Last BP in normal range    BP Readings from Last 1 Encounters:  02/14/23 128/78         Passed - Valid encounter within last 12 months  Recent Outpatient Visits           1 month ago Left-sided chest pain   [facility] [place], [place] W, NP   5 months ago Encounter for general adult medical examination with abnormal findings   [place] [facility] [place], Salvadore Oxford, NP   7 months ago 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[facility])   [place] [facility] [place], Salvadore Oxford, NP   1 year ago Encounter for general adult medical examination with abnormal findings   [place] [place], Salvadore Oxford, NP   1 year ago Aortic atherosclerosis [facility])   [place] [facility] [place], Salvadore Oxford, NP               donepezil (ARICEPT) 10 MG tablet [Pharmacy Med Name: Donepezil HCl Oral Tablet 10 MG] 90 tablet 0    Sig: TAKE 1 TABLET EVERY DAY     Neurology:  Alzheimer's Agents Passed - 04/10/2023  2:44 AM      Passed - Valid encounter within last 6 months    Recent Outpatient Visits           1 month ago Left-sided chest pain   [place] [place], Salvadore Oxford, NP   5 months ago Encounter for general adult medical examination with abnormal findings   [facility] [place], Salvadore Oxford, NP   7 months ago 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[facility])   [place] [facility] [place], Salvadore Oxford, NP   1 year ago Encounter for general adult medical examination with abnormal findings   [facility] [place], Salvadore Oxford, NP   1 year ago Aortic atherosclerosis [facility])    [facility], Salvadore Oxford, NP               divalproex (DEPAKOTE ER) 500 MG 24 hr tablet [Pharmacy Med Name: Divalproex Sodium ER Oral Tablet Extended Release 24 Hour 500 MG] 180 tablet 0    Sig: TAKE 2 TABLETS EVERY DAY     Neurology:  Anticonvulsants - Valproates Failed - 04/10/2023  2:44 AM      Failed - AST in normal range and within 360 days    AST  Date Value Ref Range Status  02/14/2023 10 (L) 15 - 41 U/L Final         Failed - HGB in normal range and within 360 days    Hemoglobin  Date Value Ref Range Status  02/14/2023 11.4 (L) 13.0 - 17.0 g/dL Final         Failed - PLT in normal range and within 360 days    Platelets  Date Value Ref Range Status  02/14/2023 143 (L) 150 - 400 K/uL Final         Failed - HCT in normal range and within 360 days    HCT  Date Value Ref Range Status  02/14/2023 34.0 (L) 39.0 - 52.0 % Final         Failed - Valproic Acid (serum) in normal range and within 360 days    Valproic Acid Lvl  Date Value Ref Range Status  12/17/2016 53 50.0 - 100.0 ug/mL Final         Passed - ALT in normal range and within 360 days    ALT  Date Value Ref Range Status  02/14/2023 <5  0 - 44 U/L Final         Passed - WBC in normal range and within 360 days    WBC  Date Value Ref Range Status  02/14/2023 6.1 4.0 - 10.5 K/uL Final         Passed - Completed PHQ-2 or PHQ-9 in the last 360 days      Passed - Patient is not pregnant      Passed - Valid encounter within last 12 months    Recent Outpatient Visits           1 month ago Left-sided chest pain   [facility] [place],  Salvadore Oxford, NP   5 months ago Encounter for general adult medical examination with abnormal findings   [place] [facility] [place], Salvadore Oxford, NP   7 months ago 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[facility])   [place] [facility] [place], Salvadore Oxford, NP   1 year ago Encounter for general adult medical examination with abnormal findings   [place] [facility] [place], Salvadore Oxford, NP   1 year ago Aortic atherosclerosis [facility])   [place] [place], Salvadore Oxford, [place]

## 2023-04-14 NOTE — Telephone Encounter (Signed)
Requested Prescriptions  Pending Prescriptions Disp Refills   pantoprazole (PROTONIX) 20 MG tablet [Pharmacy Med Name: PANTOPRAZOLE SOD DR 20 MG TAB] 90 tablet 1    Sig: TAKE 1 TABLET BY MOUTH EVERY DAY     Gastroenterology: Proton Pump Inhibitors Passed - 04/11/2023  1:40 AM      Passed - Valid encounter within last 12 months    Recent Outpatient Visits           2 months ago Left-sided chest pain   [facility] [place], Salvadore Oxford, NP   5 months ago Encounter for general adult medical examination with abnormal findings   [place] [facility] [place], Salvadore Oxford, NP   7 months ago 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[facility])   [place] [facility] [place], Salvadore Oxford, NP   1 year ago Encounter for general adult medical examination with abnormal findings   [facility] [place], Salvadore Oxford, NP   1 year ago Aortic atherosclerosis [facility])   [place] [facility] [place], Salvadore Oxford, [place]

## 2023-04-30 ENCOUNTER — Other Ambulatory Visit: Payer: Self-pay | Admitting: Internal Medicine

## 2023-04-30 NOTE — Telephone Encounter (Signed)
Requested Prescriptions  Pending Prescriptions Disp Refills   citalopram (CELEXA) 20 MG tablet [Pharmacy Med Name: Citalopram Hydrobromide Oral Tablet 20 MG] 90 tablet 0    Sig: TAKE 1 TABLET EVERY DAY     Psychiatry:  Antidepressants - SSRI Passed - 04/30/2023 10:53 AM      Passed - Valid encounter within last 6 months    Recent Outpatient Visits           2 months ago Left-sided chest pain   [place] [facility] [place], Salvadore Oxford, NP   6 months ago Encounter for general adult medical examination with abnormal findings   [place] [facility], Salvadore Oxford, NP   8 months ago 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[facility])   [place] [facility] [place], Salvadore Oxford, NP   1 year ago Encounter for general adult medical examination with abnormal findings   [place] [facility] [place], Salvadore Oxford, NP   1 year ago Aortic atherosclerosis [facility])   [place] [facility] [place], [place] W, NP               memantine (NAMENDA XR) 14 MG CP24 24 hr capsule [Pharmacy Med Name: Memantine HCl ER Oral Capsule Extended Release 24 Hour 14 MG] 90 capsule 0    Sig: TAKE 1 CAPSULE EVERY DAY     Neurology:  Alzheimer's Agents 2 Passed - 04/30/2023 10:53 AM      Passed - Cr in normal range and within 360 days    Creat  Date Value Ref Range Status  02/12/2023 1.15 0.70 - 1.28 mg/dL Final   Creatinine, Ser  Date Value Ref Range Status  02/14/2023 1.10 0.61 - 1.24 mg/dL Final   Creatinine,U  Date Value Ref Range Status  03/20/2017 115.3 mg/dL Final   Creatinine, Urine  Date Value Ref Range Status  09/20/2021 209 20 - 320 mg/dL Final         Passed - eGFR is 5 or above and within 360 days    GFR calc Af Amer  Date Value Ref Range Status  12/25/2019 >60 >60 mL/min Final   GFR, Estimated  Date Value Ref Range Status  02/14/2023 >60 >60 mL/min Final    Comment:    (NOTE) Calculated using the CKD-EPI Creatinine  Equation (2021)    GFR  Date Value Ref Range Status  01/06/2020 59.40 (L) >60.00 mL/min Final   eGFR  Date Value Ref Range Status  02/12/2023 65 > OR = 60 mL/min/1.50m2 Final         Passed - Valid encounter within last 6 months    Recent Outpatient Visits           2 months ago Left-sided chest pain   [facility] [place], Salvadore Oxford, NP   6 months ago Encounter for general adult medical examination with abnormal findings   [facility] [place], Salvadore Oxford, NP   8 months ago 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[facility])   [place] [facility] [place], Salvadore Oxford, NP   1 year ago Encounter for general adult medical examination with abnormal findings   [place] [facility] [place] [facility] [place], Salvadore Oxford, NP   1 year ago Aortic atherosclerosis [facility])   [place] [facility] [place], Salvadore Oxford, [place]

## 2023-05-19 ENCOUNTER — Other Ambulatory Visit: Payer: Self-pay | Admitting: Internal Medicine

## 2023-05-20 NOTE — Telephone Encounter (Signed)
Requested Prescriptions  Pending Prescriptions Disp Refills   mirtazapine (REMERON) 45 MG tablet [Pharmacy Med Name: Mirtazapine Oral Tablet 45 MG] 90 tablet 0    Sig: TAKE 1 TABLET AT BEDTIME     Psychiatry: Antidepressants - mirtazapine Passed - 05/19/2023  3:03 AM      Passed - Valid encounter within last 6 months    Recent Outpatient Visits           3 months ago Left-sided chest pain   [place] [place], [place] W, NP   7 months ago Encounter for general adult medical examination with abnormal findings   [facility] [place], Salvadore Oxford, NP   9 months ago 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[facility])   [facility] [place], Salvadore Oxford, NP   1 year ago Encounter for general adult medical examination with abnormal findings   [place] [facility] [place], Salvadore Oxford, NP   1 year ago Aortic atherosclerosis [facility])   [place] [facility] [place], Salvadore Oxford, NP               metFORMIN (GLUCOPHAGE) 500 MG tablet [Pharmacy Med Name: metFORMIN HCl Oral Tablet 500 MG] 180 tablet 0    Sig: TAKE 1 TABLET TWICE DAILY WITH MEALS     Endocrinology:  Diabetes - Biguanides Failed - 05/19/2023  3:03 AM      Failed - HBA1C is between 0 and 7.9 and within 180 days    Hgb A1c MFr Bld  Date Value Ref Range Status  08/21/2022 6.5 (H) <5.7 % of total Hgb Final    Comment:    For someone without known diabetes, a hemoglobin A1c value of 6.5% or greater indicates that they may have  diabetes and this should be confirmed with a follow-up  test. . For someone with known diabetes, a value <7% indicates  that their diabetes is well controlled and a value  greater than or equal to 7% indicates suboptimal  control. A1c targets should be individualized based on  duration of diabetes, age, comorbid conditions, and  other considerations. . Currently, no consensus exists regarding use of hemoglobin  A1c for diagnosis of diabetes for children. .          Failed - B12 Level in normal range and within 720 days    Vitamin B-12  Date Value Ref Range Status  12/21/2019 325 180 - 914 pg/mL Final    Comment:    (NOTE) This assay is not validated for testing neonatal or myeloproliferative syndrome specimens for Vitamin B12 levels. Performed at [facility], [address], [place], [place] [address]          Passed - Cr in normal range and within 360 days    Creat  Date Value Ref Range Status  02/12/2023 1.15 0.70 - 1.28 mg/dL Final   Creatinine, Ser  Date Value Ref Range Status  02/14/2023 1.10 0.61 - 1.24 mg/dL Final   Creatinine,U  Date Value Ref Range Status  03/20/2017 115.3 mg/dL Final   Creatinine, Urine  Date Value Ref Range Status  09/20/2021 209 20 - 320 mg/dL Final         Passed - eGFR in normal range and within 360 days    GFR calc Af Amer  Date Value Ref Range Status  12/25/2019 >60 >60 mL/min Final   GFR, Estimated  Date Value Ref Range Status  02/14/2023 >60 >60 mL/min Final    Comment:    (NOTE) Calculated using the CKD-EPI Creatinine Equation (2021)    GFR  Date Value Ref Range Status  01/06/2020 59.40 (L) >60.00 mL/min Final   eGFR  Date Value Ref Range Status  02/12/2023 65 > OR = 60 mL/min/1.61m2 Final         Passed - Valid encounter within last 6 months    Recent Outpatient Visits           3 months ago Left-sided chest pain   [facility] [place], Salvadore Oxford, NP   7 months ago Encounter for general adult medical examination with abnormal findings   [place] [place], Salvadore Oxford, NP   9 months ago 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[facility])   [place] [facility] [place], Salvadore Oxford, NP   1 year ago Encounter for general adult medical examination with abnormal findings   [place] [facility] [place], Salvadore Oxford, NP   1 year ago Aortic  atherosclerosis [facility])   [place] [facility] [place], Salvadore Oxford, NP              Passed - CBC within normal limits and completed in the last 12 months    WBC  Date Value Ref Range Status  02/14/2023 6.1 4.0 - 10.5 K/uL Final   RBC  Date Value Ref Range Status  02/14/2023 3.47 (L) 4.22 - 5.81 MIL/uL Final   Hemoglobin  Date Value Ref Range Status  02/14/2023 11.4 (L) 13.0 - 17.0 g/dL Final   HCT  Date Value Ref Range Status  02/14/2023 34.0 (L) 39.0 - 52.0 % Final   MCHC  Date Value Ref Range Status  02/14/2023 33.5 30.0 - 36.0 g/dL Final   [facility]  Date Value Ref Range Status  02/14/2023 32.9 26.0 - 34.0 pg Final   MCV  Date Value Ref Range Status  02/14/2023 98.0 80.0 - 100.0 fL Final   No results found for: "PLTCOUNTKUC", "LABPLAT", "POCPLA" RDW  Date Value Ref Range Status  02/14/2023 12.3 11.5 - 15.5 % Final  10/11/2014 13.5 12.3 - 15.4 % Final

## 2023-05-21 ENCOUNTER — Other Ambulatory Visit: Payer: Self-pay | Admitting: Internal Medicine

## 2023-05-22 ENCOUNTER — Encounter: Payer: Self-pay | Admitting: Internal Medicine

## 2023-05-22 NOTE — Telephone Encounter (Signed)
Requested medication (s) are due for refill today: yes  Requested medication (s) are on the active medication list: yes  Last refill:  04/04/23  Future visit scheduled: no  Notes to clinic:  Unable to refill per protocol, cannot delegate.      Requested Prescriptions  Pending Prescriptions Disp Refills   LORazepam (ATIVAN) 0.5 MG tablet [Pharmacy Med Name: LORAZEPAM 0.5 MG TABLET] 45 tablet 0    Sig: TAKE 1 TO 2 TABLETS BY MOUTH TWICE A DAY AS NEEDED     Not Delegated - Psychiatry: Anxiolytics/Hypnotics 2 Failed - 05/21/2023 12:03 PM      Failed - This refill cannot be delegated      Failed - Urine Drug Screen completed in last 360 days      Passed - Patient is not pregnant      Passed - Valid encounter within last 6 months    Recent Outpatient Visits           3 months ago Left-sided chest pain   [facility] [place], Salvadore Oxford, NP   7 months ago Encounter for general adult medical examination with abnormal findings   [place] [facility] [place], Salvadore Oxford, NP   9 months ago 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[facility])   [place] [facility] [place], Salvadore Oxford, NP   1 year ago Encounter for general adult medical examination with abnormal findings   [facility] [place], Salvadore Oxford, NP   1 year ago Aortic atherosclerosis [facility])   [place] [facility] [place], Salvadore Oxford, [place]

## 2023-05-25 ENCOUNTER — Other Ambulatory Visit: Payer: Self-pay | Admitting: Internal Medicine

## 2023-05-26 NOTE — Telephone Encounter (Signed)
Can you schedule him for a home visit 11/13 at 11:20

## 2023-05-27 ENCOUNTER — Other Ambulatory Visit: Payer: Self-pay | Admitting: Internal Medicine

## 2023-05-27 NOTE — Telephone Encounter (Signed)
Requested medication (s) are due for refill today: expired medication date  Requested medication (s) are on the active medication list: yes   Last refill:  05/23/22 #90 1 refills   Future visit scheduled: no   Notes to clinic:  expired medication date. Do you want to renew Rx?     Requested Prescriptions  Pending Prescriptions Disp Refills   meloxicam (MOBIC) 15 MG tablet [Pharmacy Med Name: Meloxicam Oral Tablet 15 MG] 90 tablet 3    Sig: TAKE 1 TABLET EVERY DAY WITH SUPPER     Analgesics:  COX2 Inhibitors Failed - 05/25/2023  1:46 PM      Failed - Manual Review: Labs are only required if the patient has taken medication for more than 8 weeks.      Failed - HGB in normal range and within 360 days    Hemoglobin  Date Value Ref Range Status  02/14/2023 11.4 (L) 13.0 - 17.0 g/dL Final         Failed - HCT in normal range and within 360 days    HCT  Date Value Ref Range Status  02/14/2023 34.0 (L) 39.0 - 52.0 % Final         Failed - AST in normal range and within 360 days    AST  Date Value Ref Range Status  02/14/2023 10 (L) 15 - 41 U/L Final         Passed - Cr in normal range and within 360 days    Creat  Date Value Ref Range Status  02/12/2023 1.15 0.70 - 1.28 mg/dL Final   Creatinine, Ser  Date Value Ref Range Status  02/14/2023 1.10 0.61 - 1.24 mg/dL Final   Creatinine,U  Date Value Ref Range Status  03/20/2017 115.3 mg/dL Final   Creatinine, Urine  Date Value Ref Range Status  09/20/2021 209 20 - 320 mg/dL Final         Passed - ALT in normal range and within 360 days    ALT  Date Value Ref Range Status  02/14/2023 <5 0 - 44 U/L Final         Passed - eGFR is 30 or above and within 360 days    GFR calc Af Amer  Date Value Ref Range Status  12/25/2019 >60 >60 mL/min Final   GFR, Estimated  Date Value Ref Range Status  02/14/2023 >60 >60 mL/min Final    Comment:    (NOTE) Calculated using the CKD-EPI Creatinine Equation (2021)    GFR  Date  Value Ref Range Status  01/06/2020 59.40 (L) >60.00 mL/min Final   eGFR  Date Value Ref Range Status  02/12/2023 65 > OR = 60 mL/min/1.12m2 Final         Passed - Patient is not pregnant      Passed - Valid encounter within last 12 months    Recent Outpatient Visits           3 months ago Left-sided chest pain   [place] [facility] [place], Salvadore Oxford, NP   7 months ago Encounter for general adult medical examination with abnormal findings   [facility] [place], Salvadore Oxford, NP   9 months ago 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[facility])   [place] [facility] [place], Salvadore Oxford, NP   1 year ago Encounter for general adult medical examination with abnormal findings   [place] [facility] [place], Salvadore Oxford, NP  1 year ago Aortic atherosclerosis [facility])   [place] [facility] [place], [place], [place]              Signed Prescriptions Disp Refills   atorvastatin (LIPITOR) 40 MG tablet 90 tablet 3    Sig: TAKE 1 TABLET EVERY DAY     Cardiovascular:  Antilipid - Statins Failed - 05/25/2023  1:46 PM      Failed - Lipid Panel in normal range within the last 12 months    Cholesterol  Date Value Ref Range Status  10/17/2022 140 <200 mg/dL Final   LDL Cholesterol (Calc)  Date Value Ref Range Status  10/17/2022 71 mg/dL (calc) Final    Comment:    Reference range: <100 . Desirable range <100 mg/dL for primary prevention;   <70 mg/dL for patients with CHD or diabetic patients  with > or = 2 CHD risk factors. Marland Kitchen LDL-C is now calculated using the Martin-Hopkins  calculation, which is a validated novel method providing  better accuracy than the Friedewald equation in the  estimation of LDL-C.  Horald Pollen et al. Lenox Ahr. 1308;657(84): 2061-2068  (http://education.Quest[employer].com/faq/FAQ164)    Direct LDL  Date Value Ref Range Status  04/27/2019 92.0 mg/dL Final    Comment:    Optimal:   <100 mg/dLNear or Above Optimal:  100-129 mg/dLBorderline High:  130-159 mg/dLHigh:  160-189 mg/dLVery High:  >190 mg/dL   HDL  Date Value Ref Range Status  10/17/2022 36 (L) > OR = 40 mg/dL Final   Triglycerides  Date Value Ref Range Status  10/17/2022 252 (H) <150 mg/dL Final    Comment:    . If a non-fasting specimen was collected, consider repeat triglyceride testing on a fasting specimen if clinically indicated.  Perry Mount et al. J. of Clin. Lipidol. 2015;9:129-169. Marland Kitchen          Passed - Patient is not pregnant      Passed - Valid encounter within last 12 months    Recent Outpatient Visits           3 months ago Left-sided chest pain   [facility] [place], Salvadore Oxford, NP   7 months ago Encounter for general adult medical examination with abnormal findings   [place] [facility] [place], Salvadore Oxford, NP   9 months ago 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[facility])   [place] [facility] [place], Salvadore Oxford, NP   1 year ago Encounter for general adult medical examination with abnormal findings   [facility] [place], Salvadore Oxford, NP   1 year ago Aortic atherosclerosis [facility])   [place] [facility] [place], Salvadore Oxford, [place]

## 2023-05-27 NOTE — Telephone Encounter (Signed)
Requested by interface surescripts. No future visit .  Requested Prescriptions  Pending Prescriptions Disp Refills   meloxicam (MOBIC) 15 MG tablet [Pharmacy Med Name: Meloxicam Oral Tablet 15 MG] 90 tablet 3    Sig: TAKE 1 TABLET EVERY DAY WITH SUPPER     Analgesics:  COX2 Inhibitors Failed - 05/25/2023  1:46 PM      Failed - Manual Review: Labs are only required if the patient has taken medication for more than 8 weeks.      Failed - HGB in normal range and within 360 days    Hemoglobin  Date Value Ref Range Status  02/14/2023 11.4 (L) 13.0 - 17.0 g/dL Final         Failed - HCT in normal range and within 360 days    HCT  Date Value Ref Range Status  02/14/2023 34.0 (L) 39.0 - 52.0 % Final         Failed - AST in normal range and within 360 days    AST  Date Value Ref Range Status  02/14/2023 10 (L) 15 - 41 U/L Final         Passed - Cr in normal range and within 360 days    Creat  Date Value Ref Range Status  02/12/2023 1.15 0.70 - 1.28 mg/dL Final   Creatinine, Ser  Date Value Ref Range Status  02/14/2023 1.10 0.61 - 1.24 mg/dL Final   Creatinine,U  Date Value Ref Range Status  03/20/2017 115.3 mg/dL Final   Creatinine, Urine  Date Value Ref Range Status  09/20/2021 209 20 - 320 mg/dL Final         Passed - ALT in normal range and within 360 days    ALT  Date Value Ref Range Status  02/14/2023 <5 0 - 44 U/L Final         Passed - eGFR is 30 or above and within 360 days    GFR calc Af Amer  Date Value Ref Range Status  12/25/2019 >60 >60 mL/min Final   GFR, Estimated  Date Value Ref Range Status  02/14/2023 >60 >60 mL/min Final    Comment:    (NOTE) Calculated using the CKD-EPI Creatinine Equation (2021)    GFR  Date Value Ref Range Status  01/06/2020 59.40 (L) >60.00 mL/min Final   eGFR  Date Value Ref Range Status  02/12/2023 65 > OR = 60 mL/min/1.34m2 Final         Passed - Patient is not pregnant      Passed - Valid encounter within  last 12 months    Recent Outpatient Visits           3 months ago Left-sided chest pain   [place] [facility] [place], Salvadore Oxford, NP   7 months ago Encounter for general adult medical examination with abnormal findings   [place] [facility] [place], Salvadore Oxford, NP   9 months ago 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[facility])   [place] [facility] [place], Salvadore Oxford, NP   1 year ago Encounter for general adult medical examination with abnormal findings   [place] [facility] [place], Salvadore Oxford, NP   1 year ago Aortic atherosclerosis [facility])   [place] [facility] [place], Salvadore Oxford, NP               atorvastatin (LIPITOR) 40 MG tablet [Pharmacy Med Name: Atorvastatin Calcium Oral Tablet 40  MG] 90 tablet 3    Sig: TAKE 1 TABLET EVERY DAY     Cardiovascular:  Antilipid - Statins Failed - 05/25/2023  1:46 PM      Failed - Lipid Panel in normal range within the last 12 months    Cholesterol  Date Value Ref Range Status  10/17/2022 140 <200 mg/dL Final   LDL Cholesterol (Calc)  Date Value Ref Range Status  10/17/2022 71 mg/dL (calc) Final    Comment:    Reference range: <100 . Desirable range <100 mg/dL for primary prevention;   <70 mg/dL for patients with CHD or diabetic patients  with > or = 2 CHD risk factors. Marland Kitchen LDL-C is now calculated using the Martin-Hopkins  calculation, which is a validated novel method providing  better accuracy than the Friedewald equation in the  estimation of LDL-C.  Horald Pollen et al. Lenox Ahr. 4098;119(14): 2061-2068  (http://education.Quest[employer].com/faq/FAQ164)    Direct LDL  Date Value Ref Range Status  04/27/2019 92.0 mg/dL Final    Comment:    Optimal:  <100 mg/dLNear or Above Optimal:  100-129 mg/dLBorderline High:  130-159 mg/dLHigh:  160-189 mg/dLVery High:  >190 mg/dL   HDL  Date Value Ref Range Status  10/17/2022 36 (L) > OR = 40 mg/dL Final    Triglycerides  Date Value Ref Range Status  10/17/2022 252 (H) <150 mg/dL Final    Comment:    . If a non-fasting specimen was collected, consider repeat triglyceride testing on a fasting specimen if clinically indicated.  Perry Mount et al. J. of Clin. Lipidol. 2015;9:129-169. Marland Kitchen          Passed - Patient is not pregnant      Passed - Valid encounter within last 12 months    Recent Outpatient Visits           3 months ago Left-sided chest pain   [place] [facility] [place], Salvadore Oxford, NP   7 months ago Encounter for general adult medical examination with abnormal findings   [place] [facility] [place], Salvadore Oxford, NP   9 months ago 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[facility])   [place] [facility] [place], Salvadore Oxford, NP   1 year ago Encounter for general adult medical examination with abnormal findings   [place] [facility] [place], Salvadore Oxford, NP   1 year ago Aortic atherosclerosis [facility])    [place] [facility] [place], Salvadore Oxford, [place]

## 2023-05-28 NOTE — Telephone Encounter (Signed)
Requested Prescriptions  Pending Prescriptions Disp Refills   lisinopril (ZESTRIL) 5 MG tablet [Pharmacy Med Name: LISINOPRIL 5 MG TABLET] 90 tablet 0    Sig: TAKE 1 TABLET (5 MG TOTAL) BY MOUTH DAILY.     Cardiovascular:  ACE Inhibitors Passed - 05/27/2023  1:33 AM      Passed - Cr in normal range and within 180 days    Creat  Date Value Ref Range Status  02/12/2023 1.15 0.70 - 1.28 mg/dL Final   Creatinine, Ser  Date Value Ref Range Status  02/14/2023 1.10 0.61 - 1.24 mg/dL Final   Creatinine,U  Date Value Ref Range Status  03/20/2017 115.3 mg/dL Final   Creatinine, Urine  Date Value Ref Range Status  09/20/2021 209 20 - 320 mg/dL Final         Passed - K in normal range and within 180 days    Potassium  Date Value Ref Range Status  02/14/2023 5.0 3.5 - 5.1 mmol/L Final         Passed - Patient is not pregnant      Passed - Last BP in normal range    BP Readings from Last 1 Encounters:  02/14/23 128/78         Passed - Valid encounter within last 6 months    Recent Outpatient Visits           3 months ago Left-sided chest pain   [facility] [place], Salvadore Oxford, NP   7 months ago Encounter for general adult medical examination with abnormal findings   [place] [facility] [place], Salvadore Oxford, NP   9 months ago 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[facility])   [place] [facility] [place], Salvadore Oxford, NP   1 year ago Encounter for general adult medical examination with abnormal findings   [place] [facility] [place], Salvadore Oxford, NP   1 year ago Aortic atherosclerosis [facility])   [place] [facility] [place], Salvadore Oxford, [place]

## 2023-06-04 ENCOUNTER — Telehealth: Payer: Medicare HMO | Admitting: Internal Medicine

## 2023-06-04 ENCOUNTER — Encounter: Payer: Self-pay | Admitting: Internal Medicine

## 2023-06-04 DIAGNOSIS — K219 Gastro-esophageal reflux disease without esophagitis: Secondary | ICD-10-CM | POA: Diagnosis not present

## 2023-06-04 DIAGNOSIS — I10 Essential (primary) hypertension: Secondary | ICD-10-CM | POA: Diagnosis not present

## 2023-06-04 DIAGNOSIS — G20B2 Parkinson's disease with dyskinesia, with fluctuations: Secondary | ICD-10-CM | POA: Diagnosis not present

## 2023-06-04 DIAGNOSIS — R350 Frequency of micturition: Secondary | ICD-10-CM

## 2023-06-04 DIAGNOSIS — N401 Enlarged prostate with lower urinary tract symptoms: Secondary | ICD-10-CM | POA: Diagnosis not present

## 2023-06-04 DIAGNOSIS — E119 Type 2 diabetes mellitus without complications: Secondary | ICD-10-CM

## 2023-06-04 DIAGNOSIS — E118 Type 2 diabetes mellitus with unspecified complications: Secondary | ICD-10-CM

## 2023-06-04 DIAGNOSIS — E781 Pure hyperglyceridemia: Secondary | ICD-10-CM

## 2023-06-04 DIAGNOSIS — I700 Atherosclerosis of aorta: Secondary | ICD-10-CM | POA: Diagnosis not present

## 2023-06-04 DIAGNOSIS — J449 Chronic obstructive pulmonary disease, unspecified: Secondary | ICD-10-CM

## 2023-06-04 DIAGNOSIS — Z7984 Long term (current) use of oral hypoglycemic drugs: Secondary | ICD-10-CM

## 2023-06-04 DIAGNOSIS — G479 Sleep disorder, unspecified: Secondary | ICD-10-CM

## 2023-06-04 DIAGNOSIS — G8929 Other chronic pain: Secondary | ICD-10-CM

## 2023-06-04 DIAGNOSIS — E1142 Type 2 diabetes mellitus with diabetic polyneuropathy: Secondary | ICD-10-CM

## 2023-06-04 DIAGNOSIS — F03918 Unspecified dementia, unspecified severity, with other behavioral disturbance: Secondary | ICD-10-CM

## 2023-06-04 DIAGNOSIS — M549 Dorsalgia, unspecified: Secondary | ICD-10-CM

## 2023-06-04 MED ORDER — FLUCONAZOLE 150 MG PO TABS: 150.0000 mg | ORAL_TABLET | ORAL | 0 refills | Status: DC

## 2023-06-04 NOTE — Assessment & Plan Note (Signed)
Unable to obtain labs today Encouraged him to consume a low-carb diet Continue metformin Encourage routine eye exam He follows with podiatry for foot exams Immunizations UTD

## 2023-06-04 NOTE — Patient Instructions (Signed)
Dementia Caregiver Guide Dementia is a condition that affects the way the brain works. It often affects thinking and memory. A person with dementia may: Forget things. Have trouble talking or responding to your questions. Have trouble paying attention. Have trouble thinking clearly and making good decisions. Get lost or wander away from home or other places. Have big changes in their mood or emotions. They may: Feel very worried, nervous, or depressed. Have angry outbursts. Be suspicious or accuse you of things. Have childlike behavior and language. Taking care of someone with dementia can be a challenge. The tips below can help you care for the person. How to help manage lifestyle changes Dementia usually gets worse slowly over time. In the early stages, people with dementia can stay safe and take care of themselves with some help. In later stages, they need help with daily tasks like getting dressed, grooming, and going to the bathroom. Communicating When the person is talking and seems frustrated, make eye contact and hold the person's hand. Ask questions that can be answered with a yes or no. Use simple words and a calm voice. Only give one direction at a time. Limit choices for the person. Too many choices can be stressful. Avoid correcting the person in a negative way. If the person can't find the right words, gently try to help. Preventing injury  Keep floors clear. Remove rugs, magazine racks, and floor lamps. Keep hallways well lit, especially at night. Put a handrail and nonslip mat in the bathtub or shower. Put childproof locks on cabinets that have dangerous items in them. These items include medicine, alcohol, guns, cleaning products, and sharp tools. For doors to the outside, put locks where the person can't see or reach them. This helps keep the person from going out of the house and getting lost. Be ready for emergencies. Keep a list of emergency phone numbers and  addresses close by. Remove car keys and lock garage doors so the person doesn't try to drive. Have the person wear a bracelet that tracks where they are and shows that they're a person with memory loss. This should be worn at all times for safety. Helping with daily life  Keep the person on track with their daily routine. Try to identify areas where the person may need help. Be supportive, patient, calm, and encouraging. Gently remind the person that adjusting to changes takes time. Help with the tasks that the person has asked for help with. Keep the person involved in daily tasks and decisions as much as you can. Encourage conversation, but try not to get frustrated if the person struggles to find words or doesn't seem to appreciate your help. Other tips Think about any safety risks and take steps to avoid them. Keep things organized: Organize medicines in a pill box for each day of the week. Keep a calendar in a central place. Use it to remind the person of health care visits or other activities. Create a plan to handle any legal or financial matters. Get help from a professional if needed. Help make sure the person: Takes medicines only as told by their health care providers. Eats regular, healthy meals. They should also drink plenty of fluids. Goes to all scheduled health care appointments. Gets regular sleep. Taking care of yourself Being a caregiver for someone with dementia can be hard. You may feel stressed and have many other emotions. It's important to also take care of yourself. Here are some tips: Find out about services that  can provide short-term care for the person. This is called respite care. It can allow you to take a break when you need one. Find healthy ways to deal with stress. Some ways include: Spending time with other people. Exercising. Meditating or doing deep breathing exercises. Take care of your own health by: Getting enough sleep. Eating healthy  foods. Getting regular exercise. Join a support group with others who are caregivers. These groups can help you: Learn other ways to deal with stress. Share experiences with others. Get emotional comfort and support. Learn about caregiving as the disease gets worse. Find resources in your community. Where to find support: Many people and organizations offer support. These include: Support groups for people with dementia. Support groups for caregivers. Counselors or therapists. Home health care services. Adult day care centers. Where to find more information Alzheimer's Association: WesternTunes.it Family Caregiver Alliance: caregiver.org Alzheimer's Foundation of [place]: alzfdn.org Contact a health care provider if: The person's health is quickly getting worse. You're no longer able to care for the person. Caring for the person is affecting your physical and emotional health. You're feeling worried, nervous, or depressed about caring for the person. Get help right away if: You feel like the person may hurt themselves or others. The person has talked about taking their own life. These symptoms may be an emergency. Take one of these steps right away: Go to your nearest emergency room. Call 911. Call the National Suicide Prevention Lifeline at 401 856 7434 or 988. Text the Crisis Text Line at 320-444-2567. This information is not intended to replace advice given to you by your health care provider. Make sure you discuss any questions you have with your health care provider. Document Revised: 10/18/2022 Document Reviewed: 10/18/2022 Elsevier Patient Education  2024 [employer].

## 2023-06-04 NOTE — Assessment & Plan Note (Signed)
Will check lipid profile at annual exam Encouraged to consume a low-fat diet Continue atorvastatin and aspirin

## 2023-06-04 NOTE — Assessment & Plan Note (Signed)
Continue lisinopril Will check kidney function and annual exam

## 2023-06-04 NOTE — Assessment & Plan Note (Signed)
Continue Sinemet ?

## 2023-06-04 NOTE — Assessment & Plan Note (Signed)
-   Continue Flomax 

## 2023-06-04 NOTE — Assessment & Plan Note (Signed)
Continue Percocet, cyclobenzaprine, meloxicam and gabapentin He will continue to follow with pain management

## 2023-06-04 NOTE — Assessment & Plan Note (Signed)
Continue Depakote, donepezil, memantine, citalopram, lorazepam, trazodone and mirtazapine.

## 2023-06-04 NOTE — Assessment & Plan Note (Signed)
Encourage compliance with Breo and albuterol Continue oxygen as needed Encourage smoking cessation

## 2023-06-04 NOTE — Assessment & Plan Note (Signed)
Encouraged him to consume a low-fat diet Continue simvastatin

## 2023-06-04 NOTE — Progress Notes (Signed)
Virtual Visit via Video Note  I connected with Alexander Booth on 06/04/23 at 11:20 AM EST by a video enabled telemedicine application and verified that I am speaking with the correct person using two identifiers.  Location: Patient: Home Provider: Office  Persons participating in this video call: Nicki Reaper, NP, Sundra Aland and Elisha Ponder (patient's daughter).   I discussed the limitations of evaluation and management by telemedicine and the availability of in person appointments. The patient expressed understanding and agreed to proceed.  History of Present Illness:     Subjective:    Patient ID: Alexander Booth, male    DOB: Sep 16, 1945, 77 y.o.   MRN: [id number]  HPI  Patient seen in his home for 37-month follow-up.  HTN: He does not check his blood pressure at home.  He is taking lisinopril as prescribed.  ECG from 01/2023 reviewed.  COPD: He denies chronic cough but does have shortness of breath.  He is not using breo and albuterol as prescribed.  He is on oxygen only as needed but does wear this nightly.  There are no PFTs on file.  He follows with pulmonology.  GERD: He reports intermittent breakthrough on pantoprazole but does not take any OTC medications for this.  There is no upper GI on file.  Dementia with behavioral disturbance: Chronic, managed on depakote, donepezil, memantine, citalopram, lorazepam, trazodone and mirtazapine.  He is not currently seeing a therapist.  He does not follow with neurology.  DM2: His last A1c was 6.5%, 07/2022.  He is taking metformin as prescribed.  He does not check his sugars.  He does not check his feet routinely.  His [facility] was [facility]/20[facility], [facility]  Flu 04/2023.  Pneumovax 04/2018.  Prevnar 09/2022.  COVID Pfizer x5.  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  He is taking percocet, cyclobenzaprine, meloxicam and gabapentin as prescribed.  He follows with pain management.  HLD with Aortic  Atherosclerosis: His last LDL was 71, triglycerides 409, 09/2022.  He denies myalgias on simvastatin.  He is taking aspirin as well.  He tries to consume a low-fat diet.  Parkinson's: He reports tremors and stiffness.  He is taking sinemet as prescribed.  He no longer follows with neurology  BPH: Mainly urinary frequency.  He is taking flomax as prescribed.  He does not follow with urology.  Review of Systems     Past Medical History:  Diagnosis Date   Arthritis    COPD (chronic obstructive pulmonary disease) (HCC)    Depression    Dyspnea    Erectile dysfunction    He's had a hx of    Hypercholesterolemia    Hypertension    Essential   Lumbosacral disc disease    with chronic low back pain, has implanted nerve stimulator   Peripheral neuropathy    Presenile dementia    Type 2 diabetes mellitus (HCC) 2010   takes oral meds    Current Outpatient Medications  Medication Sig Dispense Refill   Accu-Chek Softclix Lancets lancets Check sugar 2 times daily. DX E11.8 200 each 3   acetaminophen (TYLENOL) 500 MG tablet Take 1,000 mg by mouth every 6 (six) hours as needed for moderate pain.     Albuterol Sulfate (PROAIR RESPICLICK) 108 (90 Base) MCG/ACT AEPB Inhale 1-2 puffs into the lungs every 4 (four) hours as needed. (Patient taking differently: Inhale 1-2 puffs into the lungs every 4 (four) hours as needed (shortness of breath).) 1 each  11   aspirin EC 81 MG tablet Take 81 mg by mouth daily with breakfast.     atorvastatin (LIPITOR) 40 MG tablet TAKE 1 TABLET EVERY DAY 90 tablet 3   Blood Glucose Monitoring Suppl (ACCU-CHEK AVIVA PLUS) w/Device KIT Check sugar 2 times daily. DX E11.8 1 kit 12   BREO ELLIPTA 100-25 MCG/INH AEPB INHALE 1 PUFF BY MOUTH EVERY DAY 60 each 2   carbidopa-levodopa (SINEMET IR) 25-100 MG tablet TAKE 1 TABLET THREE TIMES DAILY (AT 7AM, 11AM, 4PM) 270 tablet 0   Cholecalciferol (QC VITAMIN D3) 50 MCG (2000 UT) TABS Take 2,000 Units by mouth daily with lunch.      ciprofloxacin (CIPRO) 500 MG tablet Take 1 tablet (500 mg total) by mouth 2 (two) times daily. One po bid x 7 days 14 tablet 0   citalopram (CELEXA) 20 MG tablet TAKE 1 TABLET EVERY DAY 90 tablet 0   cyclobenzaprine (FLEXERIL) 5 MG tablet Take 5 mg by mouth 2 (two) times daily.     divalproex (DEPAKOTE ER) 500 MG 24 hr tablet TAKE 2 TABLETS EVERY DAY 180 tablet 0   donepezil (ARICEPT) 10 MG tablet TAKE 1 TABLET EVERY DAY 90 tablet 0   gabapentin (NEURONTIN) 400 MG capsule TAKE 3 CAPSULES AT BEDTIME AND MAY REPEAT DOSE ONE TIME IF NEEDED 270 capsule 3   gentamicin cream (GARAMYCIN) 0.1 % Apply 1 Application topically 2 (two) times daily. 30 g 1   glucose blood (ACCU-CHEK AVIVA PLUS) test strip Check sugar 2 times daily. DX E11.8 200 each 3   glucose blood (ACCU-CHEK GUIDE) test strip Use as instructed 100 each 12   lisinopril (ZESTRIL) 5 MG tablet TAKE 1 TABLET (5 MG TOTAL) BY MOUTH DAILY. 90 tablet 0   LORazepam (ATIVAN) 0.5 MG tablet TAKE 1 TO 2 TABLETS BY MOUTH TWICE A DAY AS NEEDED 45 tablet 0   meloxicam (MOBIC) 15 MG tablet TAKE 1 TABLET EVERY DAY WITH SUPPER 90 tablet 3   memantine (NAMENDA XR) 14 MG CP24 24 hr capsule TAKE 1 CAPSULE EVERY DAY 90 capsule 0   metFORMIN (GLUCOPHAGE) 500 MG tablet TAKE 1 TABLET TWICE DAILY WITH MEALS 180 tablet 0   mirtazapine (REMERON) 45 MG tablet TAKE 1 TABLET AT BEDTIME 90 tablet 0   nitroGLYCERIN (NITROSTAT) 0.4 MG SL tablet Place 0.4 mg under the tongue every 5 (five) minutes x 3 doses as needed for chest pain.     pantoprazole (PROTONIX) 20 MG tablet TAKE 1 TABLET BY MOUTH EVERY DAY 90 tablet 1   tamsulosin (FLOMAX) 0.4 MG CAPS capsule TAKE 1 CAPSULE BY MOUTH EVERY DAY 90 capsule 1   traZODone (DESYREL) 50 MG tablet TAKE 1 TO 2 TABLETS AT BEDTIME 180 tablet 0   vitamin C (ASCORBIC ACID) 500 MG tablet Take 500 mg by mouth daily with lunch.     No current facility-administered medications for this visit.    Allergies  Allergen Reactions   Codeine  Hives and Itching    Family History  Problem Relation Age of Onset   Multiple myeloma Mother    Diabetes Mother    Diabetes Sister    Lymphoma Sister    Stroke Neg Hx     Social History   Socioeconomic History   Marital status: Married    Spouse name: Bonita Quin   Number of children: 3   Years of education: 12   Highest education level: 12th grade  Occupational History   Occupation: retired  Comment: retired  Tobacco Use   Smoking status: Every Day    Current packs/day: 1.00    Average packs/day: 1 pack/day for 40.0 years (40.0 ttl pk-yrs)    Types: Cigarettes   Smokeless tobacco: Never  Vaping Use   Vaping status: Never Used  Substance and Sexual Activity   Alcohol use: No    Alcohol/week: 0.0 standard drinks of alcohol   Drug use: No   Sexual activity: Not on file  Other Topics Concern   Not on file  Social History Narrative   Patient is right handed   Patient resides with wife,consumes 2-3 cups of caffeine daily   Social Determinants of Health   Financial Resource Strain: Low Risk  (02/11/2023)   Overall Financial Resource Strain (CARDIA)    Difficulty of Paying Living Expenses: Not hard at all  Food Insecurity: No Food Insecurity (02/11/2023)   Hunger Vital Sign    Worried About Running Out of Food in the Last Year: Never true    Ran Out of Food in the Last Year: Never true  Transportation Needs: No Transportation Needs (02/11/2023)   PRAPARE - Administrator, Civil Service (Medical): No    Lack of Transportation (Non-Medical): No  Physical Activity: Inactive (02/11/2023)   Exercise Vital Sign    Days of Exercise per Week: 0 days    Minutes of Exercise per Session: 0 min  Stress: No Stress Concern Present (01/09/2023)   [employer] of Occupational Health - Occupational Stress Questionnaire    Feeling of Stress : Not at all  Social Connections: Moderately Isolated (02/11/2023)   Social Connection and Isolation Panel [NHANES]    Frequency of  Communication with Friends and Family: More than three times a week    Frequency of Social Gatherings with Friends and Family: More than three times a week    Attends Religious Services: Never    Database administrator or Organizations: No    Attends Banker Meetings: Never    Marital Status: Married  Catering manager Violence: Not At Risk (01/09/2023)   Humiliation, Afraid, Rape, and Kick questionnaire    Fear of Current or Ex-Partner: No    Emotionally Abused: No    Physically Abused: No    Sexually Abused: No     Constitutional: Denies fever, malaise, fatigue, headache or abrupt weight changes.  HEENT: Denies eye pain, eye redness, ear pain, ringing in the ears, wax buildup, runny nose, nasal congestion, bloody nose, or sore throat. Respiratory: Patient reports and shortness of breath.  Denies chronic cough or difficulty breathing.   Cardiovascular: Denies chest pain, chest tightness, palpitations or swelling in the hands or feet.  Gastrointestinal: Patient reports intermittent reflux.  Denies abdominal pain, bloating, constipation, diarrhea or blood in the stool.  GU: Patient reports urinary frequency, nocturia.  Denies urgency, pain with urination, burning sensation, blood in urine, odor or discharge. Musculoskeletal: Patient reports chronic back pain.  Denies decrease in range of motion, difficulty with gait, or joint swelling.  Skin: Denies redness, rashes, lesions or ulcercations.  Neurological: Patient reports difficulty with memory and balance.  Denies dizziness, difficulty with speech or problems with coordination.  Psych: Patient has a history of anxiety.  Denies depression, SI/HI.  No other specific complaints in a complete review of systems (except as listed in HPI above).  Objective:   Physical Exam    Wt Readings from Last 3 Encounters:  02/14/23 175 lb (79.4 kg)  02/12/23 175 lb (  79.4 kg)  01/09/23 175 lb (79.4 kg)    General: Appears his stated  age,chronically ill appearing, in NA Pulmonary/Chest: Normal effort and positive vesicular breath sounds. No respiratory distress.  Abdomen: Normal bowel sounds.  Neurological: Alert and oriented.  Resting tremor noted. Psychiatric: Mood and affect normal. Behavior is normal. Judgment and thought content normal.    BMET    Component Value Date/Time   NA 138 02/14/2023 0727   NA 141 10/11/2014 1122   K 5.0 02/14/2023 0727   CL 99 02/14/2023 0727   CO2 32 02/14/2023 0727   GLUCOSE 109 (H) 02/14/2023 0727   BUN 17 02/14/2023 0727   BUN 11 10/11/2014 1122   CREATININE 1.10 02/14/2023 0727   CREATININE 1.15 02/12/2023 0948   CALCIUM 9.5 02/14/2023 0727   GFRNONAA >60 02/14/2023 0727   GFRAA >60 12/25/2019 0358    Lipid Panel     Component Value Date/Time   CHOL 140 10/17/2022 1012   TRIG 252 (H) 10/17/2022 1012   HDL 36 (L) 10/17/2022 1012   CHOLHDL 3.9 10/17/2022 1012   VLDL 75.6 (H) 04/27/2019 1116   LDLCALC 71 10/17/2022 1012    CBC    Component Value Date/Time   WBC 6.1 02/14/2023 0727   RBC 3.47 (L) 02/14/2023 0727   HGB 11.4 (L) 02/14/2023 0727   HCT 34.0 (L) 02/14/2023 0727   PLT 143 (L) 02/14/2023 0727   MCV 98.0 02/14/2023 0727   MCH 32.9 02/14/2023 0727   MCHC 33.5 02/14/2023 0727   RDW 12.3 02/14/2023 0727   RDW 13.5 10/11/2014 1122   LYMPHSABS 1.5 02/14/2023 0727   LYMPHSABS 1.3 10/11/2014 1122   MONOABS 0.5 02/14/2023 0727   EOSABS 0.1 02/14/2023 0727   EOSABS 0.0 10/11/2014 1122   BASOSABS 0.1 02/14/2023 0727   BASOSABS 0.0 10/11/2014 1122    Hgb A1C Lab Results  Component Value Date   HGBA1C 6.5 (H) 08/21/2022            Assessment & Plan:     Follow Up Instructions:    I discussed the assessment and treatment plan with the patient. The patient was provided an opportunity to ask questions and all were answered. The patient agreed with the plan and demonstrated an understanding of the instructions.   The patient was advised to  call back or seek an in-person evaluation if the symptoms worsen or if the condition fails to improve as anticipated.    Nicki Reaper, NP

## 2023-06-04 NOTE — Assessment & Plan Note (Signed)
Discussed good diabetic control He follows with podiatry for foot exams

## 2023-06-04 NOTE — Assessment & Plan Note (Signed)
Continue trazodone 

## 2023-06-04 NOTE — Assessment & Plan Note (Signed)
Avoid foods that trigger reflux Encourage weight loss as this can help reduce reflux symptoms Continue pantoprazole

## 2023-06-20 ENCOUNTER — Other Ambulatory Visit: Payer: Self-pay | Admitting: Internal Medicine

## 2023-06-20 DIAGNOSIS — F03918 Unspecified dementia, unspecified severity, with other behavioral disturbance: Secondary | ICD-10-CM

## 2023-06-20 DIAGNOSIS — F32A Depression, unspecified: Secondary | ICD-10-CM

## 2023-06-20 DIAGNOSIS — G4700 Insomnia, unspecified: Secondary | ICD-10-CM

## 2023-06-23 ENCOUNTER — Other Ambulatory Visit: Payer: Self-pay | Admitting: Internal Medicine

## 2023-06-23 DIAGNOSIS — G20A1 Parkinson's disease without dyskinesia, without mention of fluctuations: Secondary | ICD-10-CM

## 2023-06-24 NOTE — Telephone Encounter (Signed)
Requested medication (s) are due for refill today: yes  Requested medication (s) are on the active medication list: yes  Last refill:  05/22/23 #45/0  Future visit scheduled: no  Notes to clinic:  Unable to refill per protocol, cannot delegate.    Requested Prescriptions  Pending Prescriptions Disp Refills   LORazepam (ATIVAN) 0.5 MG tablet [Pharmacy Med Name: LORAZEPAM 0.5 MG TABLET] 45 tablet 0    Sig: TAKE 1 TO 2 TABLETS BY MOUTH TWICE A DAY AS NEEDED     Not Delegated - Psychiatry: Anxiolytics/Hypnotics 2 Failed - 06/20/2023  4:28 PM      Failed - This refill cannot be delegated      Failed - Urine Drug Screen completed in last 360 days      Passed - Patient is not pregnant      Passed - Valid encounter within last 6 months    Recent Outpatient Visits           2 weeks ago Aortic atherosclerosis [facility])   [place] [facility] [place], [place], NP   4 months ago Left-sided chest pain   [place] [facility] [place], Salvadore Oxford, NP   8 months ago Encounter for general adult medical examination with abnormal findings   [place] [facility] [place], Salvadore Oxford, NP   10 months ago 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[facility])   [place] [facility] [place], Salvadore Oxford, NP   1 year ago Encounter for general adult medical examination with abnormal findings   [place] [facility] [place], Salvadore Oxford, NP              Signed Prescriptions Disp Refills   memantine (NAMENDA XR) 14 MG CP24 24 hr capsule 90 capsule 0    Sig: TAKE 1 CAPSULE (14 MG TOTAL) BY MOUTH DAILY.     Neurology:  Alzheimer's Agents 2 Passed - 06/20/2023  4:28 PM      Passed - Cr in normal range and within 360 days    Creat  Date Value Ref Range Status  02/12/2023 1.15 0.70 - 1.28 mg/dL Final   Creatinine, Ser  Date Value Ref Range Status  02/14/2023 1.10 0.61 - 1.24 mg/dL Final   Creatinine,U  Date Value Ref Range Status   03/20/2017 115.3 mg/dL Final   Creatinine, Urine  Date Value Ref Range Status  09/20/2021 209 20 - 320 mg/dL Final         Passed - eGFR is 5 or above and within 360 days    GFR calc Af Amer  Date Value Ref Range Status  12/25/2019 >60 >60 mL/min Final   GFR, Estimated  Date Value Ref Range Status  02/14/2023 >60 >60 mL/min Final    Comment:    (NOTE) Calculated using the CKD-EPI Creatinine Equation (2021)    GFR  Date Value Ref Range Status  01/06/2020 59.40 (L) >60.00 mL/min Final   eGFR  Date Value Ref Range Status  02/12/2023 65 > OR = 60 mL/min/1.80m2 Final         Passed - Valid encounter within last 6 months    Recent Outpatient Visits           2 weeks ago Aortic atherosclerosis [facility])   [place] [facility] [place], Salvadore Oxford, NP   4 months ago Left-sided chest pain   [facility] [place], Salvadore Oxford, NP  8 months ago Encounter for general adult medical examination with abnormal findings   [place] [facility] [place], Salvadore Oxford, NP   10 months ago 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[facility])   [place] [facility] [place], Salvadore Oxford, NP   1 year ago Encounter for general adult medical examination with abnormal findings   [place] [facility] [place], Salvadore Oxford, NP

## 2023-06-24 NOTE — Telephone Encounter (Signed)
Requested Prescriptions  Pending Prescriptions Disp Refills   traZODone (DESYREL) 50 MG tablet [Pharmacy Med Name: traZODone HCl Oral Tablet 50 MG] 180 tablet 3    Sig: TAKE 1 TO 2 TABLETS AT BEDTIME     Psychiatry: Antidepressants - Serotonin Modulator Passed - 06/20/2023  5:38 PM      Passed - Valid encounter within last 6 months    Recent Outpatient Visits           2 weeks ago Aortic atherosclerosis [facility])   [place] [facility] [place], Salvadore Oxford, NP   4 months ago Left-sided chest pain   [place] [facility] [place], Salvadore Oxford, NP   8 months ago Encounter for general adult medical examination with abnormal findings   [place] [facility] [place], Salvadore Oxford, NP   10 months ago 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[facility])   [place] [facility] [place], Salvadore Oxford, NP   1 year ago Encounter for general adult medical examination with abnormal findings   [place] [facility], Salvadore Oxford, NP               divalproex (DEPAKOTE ER) 500 MG 24 hr tablet [Pharmacy Med Name: Divalproex Sodium ER Oral Tablet Extended Release 24 Hour 500 MG] 180 tablet     Sig: TAKE 2 TABLETS EVERY DAY     Neurology:  Anticonvulsants - Valproates Failed - 06/20/2023  5:38 PM      Failed - AST in normal range and within 360 days    AST  Date Value Ref Range Status  02/14/2023 10 (L) 15 - 41 U/L Final         Failed - HGB in normal range and within 360 days    Hemoglobin  Date Value Ref Range Status  02/14/2023 11.4 (L) 13.0 - 17.0 g/dL Final         Failed - PLT in normal range and within 360 days    Platelets  Date Value Ref Range Status  02/14/2023 143 (L) 150 - 400 K/uL Final         Failed - HCT in normal range and within 360 days    HCT  Date Value Ref Range Status  02/14/2023 34.0 (L) 39.0 - 52.0 % Final         Failed - Valproic Acid (serum) in normal range and within 360 days    Valproic Acid  Lvl  Date Value Ref Range Status  12/17/2016 53 50.0 - 100.0 ug/mL Final         Passed - ALT in normal range and within 360 days    ALT  Date Value Ref Range Status  02/14/2023 <5 0 - 44 U/L Final         Passed - WBC in normal range and within 360 days    WBC  Date Value Ref Range Status  02/14/2023 6.1 4.0 - 10.5 K/uL Final         Passed - Completed PHQ-2 or PHQ-9 in the last 360 days      Passed - Patient is not pregnant      Passed - Valid encounter within last 12 months    Recent Outpatient Visits           2 weeks ago Aortic atherosclerosis [facility])   [place] [facility] [place], Salvadore Oxford, NP   4 months ago Left-sided chest pain  [place] [facility] [facility] [place], Salvadore Oxford, NP   8 months ago Encounter for general adult medical examination with abnormal findings   [place] [facility] [place], Salvadore Oxford, NP   10 months ago 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[facility])   [place] [facility] [place], Salvadore Oxford, NP   1 year ago Encounter for general adult medical examination with abnormal findings   [place] [facility] [place], Salvadore Oxford, NP               donepezil (ARICEPT) 10 MG tablet [place] Med Name: Donepezil HCl Oral Tablet 10 MG] 90 tablet 3    Sig: TAKE 1 TABLET EVERY DAY     Neurology:  Alzheimer's Agents Passed - 06/20/2023  5:38 PM      Passed - Valid encounter within last 6 months    Recent Outpatient Visits           2 weeks ago Aortic atherosclerosis [facility])   [place] [facility] [place], Salvadore Oxford, NP   4 months ago Left-sided chest pain   [place] [facility] [place], Salvadore Oxford, NP   8 months ago Encounter for general adult medical examination with abnormal findings   [place] [facility] [place], Salvadore Oxford, NP   10 months ago 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[facility])    [facility] [place],  Salvadore Oxford, NP   1 year ago Encounter for general adult medical examination with abnormal findings    [facility] [place], Salvadore Oxford, NP

## 2023-06-24 NOTE — Telephone Encounter (Signed)
Requested Prescriptions  Pending Prescriptions Disp Refills   memantine (NAMENDA XR) 14 MG CP24 24 hr capsule [Pharmacy Med Name: MEMANTINE HCL ER 14 MG CAPSULE] 90 capsule 0    Sig: TAKE 1 CAPSULE (14 MG TOTAL) BY MOUTH DAILY.     Neurology:  Alzheimer's Agents 2 Passed - 06/20/2023  4:28 PM      Passed - Cr in normal range and within 360 days    Creat  Date Value Ref Range Status  02/12/2023 1.15 0.70 - 1.28 mg/dL Final   Creatinine, Ser  Date Value Ref Range Status  02/14/2023 1.10 0.61 - 1.24 mg/dL Final   Creatinine,U  Date Value Ref Range Status  03/20/2017 115.3 mg/dL Final   Creatinine, Urine  Date Value Ref Range Status  09/20/2021 209 20 - 320 mg/dL Final         Passed - eGFR is 5 or above and within 360 days    GFR calc Af Amer  Date Value Ref Range Status  12/25/2019 >60 >60 mL/min Final   GFR, Estimated  Date Value Ref Range Status  02/14/2023 >60 >60 mL/min Final    Comment:    (NOTE) Calculated using the CKD-EPI Creatinine Equation (2021)    GFR  Date Value Ref Range Status  01/06/2020 59.40 (L) >60.00 mL/min Final   eGFR  Date Value Ref Range Status  02/12/2023 65 > OR = 60 mL/min/1.16m2 Final         Passed - Valid encounter within last 6 months    Recent Outpatient Visits           2 weeks ago Aortic atherosclerosis [facility])   [place] [facility] [place], Salvadore Oxford, NP   4 months ago Left-sided chest pain   [place] [facility] [place], Salvadore Oxford, NP   8 months ago Encounter for general adult medical examination with abnormal findings   [place] [facility] [place], Salvadore Oxford, NP   10 months ago 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[facility])   [place] [facility] [place], Salvadore Oxford, NP   1 year ago Encounter for general adult medical examination with abnormal findings   [place] [facility] [place], [place] W, NP               LORazepam (ATIVAN) 0.5 MG  tablet [Pharmacy Med Name: LORAZEPAM 0.5 MG TABLET] 45 tablet 0    Sig: TAKE 1 TO 2 TABLETS BY MOUTH TWICE A DAY AS NEEDED     Not Delegated - Psychiatry: Anxiolytics/Hypnotics 2 Failed - 06/20/2023  4:28 PM      Failed - This refill cannot be delegated      Failed - Urine Drug Screen completed in last 360 days      Passed - Patient is not pregnant      Passed - Valid encounter within last 6 months    Recent Outpatient Visits           2 weeks ago Aortic atherosclerosis [facility])   [place] [facility] [place], Salvadore Oxford, NP   4 months ago Left-sided chest pain   [place] [facility] [place], Salvadore Oxford, NP   8 months ago Encounter for general adult medical examination with abnormal findings   [place] [facility] [place], Salvadore Oxford, NP   10 months ago 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[place])    [facility]  [place], NP   1 year ago Encounter for general adult medical examination with abnormal findings   [place] [facility] [place], Salvadore Oxford, [place]

## 2023-06-24 NOTE — Telephone Encounter (Signed)
Requested medication (s) are due for refill today:yes  Requested medication (s) are on the active medication list: yes  Last refill:  04/11/23 #180  Future visit scheduled: no  Notes to clinic:  lab work outside normal range   Requested Prescriptions  Pending Prescriptions Disp Refills   divalproex (DEPAKOTE ER) 500 MG 24 hr tablet [Pharmacy Med Name: Divalproex Sodium ER Oral Tablet Extended Release 24 Hour 500 MG] 180 tablet     Sig: TAKE 2 TABLETS EVERY DAY     Neurology:  Anticonvulsants - Valproates Failed - 06/20/2023  5:38 PM      Failed - AST in normal range and within 360 days    AST  Date Value Ref Range Status  02/14/2023 10 (L) 15 - 41 U/L Final         Failed - HGB in normal range and within 360 days    Hemoglobin  Date Value Ref Range Status  02/14/2023 11.4 (L) 13.0 - 17.0 g/dL Final         Failed - PLT in normal range and within 360 days    Platelets  Date Value Ref Range Status  02/14/2023 143 (L) 150 - 400 K/uL Final         Failed - HCT in normal range and within 360 days    HCT  Date Value Ref Range Status  02/14/2023 34.0 (L) 39.0 - 52.0 % Final         Failed - Valproic Acid (serum) in normal range and within 360 days    Valproic Acid Lvl  Date Value Ref Range Status  12/17/2016 53 50.0 - 100.0 ug/mL Final         Passed - ALT in normal range and within 360 days    ALT  Date Value Ref Range Status  02/14/2023 <5 0 - 44 U/L Final         Passed - WBC in normal range and within 360 days    WBC  Date Value Ref Range Status  02/14/2023 6.1 4.0 - 10.5 K/uL Final         Passed - Completed PHQ-2 or PHQ-9 in the last 360 days      Passed - Patient is not pregnant      Passed - Valid encounter within last 12 months    Recent Outpatient Visits           2 weeks ago Aortic atherosclerosis [facility])   [place] [facility] [place], Salvadore Oxford, NP   4 months ago Left-sided chest pain   [place] [facility]  [place], Salvadore Oxford, NP   8 months ago Encounter for general adult medical examination with abnormal findings   [place] [facility] [place], Salvadore Oxford, NP   10 months ago 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[facility])   [place] [facility] [place], Salvadore Oxford, NP   1 year ago Encounter for general adult medical examination with abnormal findings    [facility] [place], Salvadore Oxford, NP              Signed Prescriptions Disp Refills   traZODone (DESYREL) 50 MG tablet 180 tablet 3    Sig: TAKE 1 TO 2 TABLETS AT BEDTIME     Psychiatry: Antidepressants - Serotonin Modulator Passed - 06/20/2023  5:38 PM      Passed - Valid encounter within last 6 months    Recent Outpatient  Visits           2 weeks ago Aortic atherosclerosis [facility])   [place] [facility] [place], [place], NP   4 months ago Left-sided chest pain   [place] [facility] [place], Salvadore Oxford, NP   8 months ago Encounter for general adult medical examination with abnormal findings   [place] [facility] [place], Salvadore Oxford, NP   10 months ago 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[facility])   [facility] [place], Salvadore Oxford, NP   1 year ago Encounter for general adult medical examination with abnormal findings   [place] [facility] [place], Salvadore Oxford, NP               donepezil (ARICEPT) 10 MG tablet 90 tablet 3    Sig: TAKE 1 TABLET EVERY DAY     Neurology:  Alzheimer's Agents Passed - 06/20/2023  5:38 PM      Passed - Valid encounter within last 6 months    Recent Outpatient Visits           2 weeks ago Aortic atherosclerosis [facility])   [place] [facility] [place], Salvadore Oxford, NP   4 months ago Left-sided chest pain   [place] [facility] [place], Salvadore Oxford, NP   8 months ago Encounter for general adult medical examination with abnormal findings    [place] [facility] [place], Salvadore Oxford, NP   10 months ago 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[facility])   [place] [facility] [place], Salvadore Oxford, NP   1 year ago Encounter for general adult medical examination with abnormal findings    [facility] [place], Salvadore Oxford, NP

## 2023-06-25 NOTE — Telephone Encounter (Signed)
Per notes last visit- continue medication Requested Prescriptions  Pending Prescriptions Disp Refills   carbidopa-levodopa (SINEMET IR) 25-100 MG tablet [Pharmacy Med Name: Carbidopa-Levodopa Oral Tablet 25-100 MG] 270 tablet 3    Sig: TAKE 1 TABLET THREE TIMES DAILY (AT 7AM, 11AM, 4PM)     Neurology:  Parkinsonian Agents 2 Failed - 06/23/2023  2:29 AM      Failed - PLT in normal range and within 360 days    Platelets  Date Value Ref Range Status  02/14/2023 143 (L) 150 - 400 K/uL Final         Failed - HGB in normal range and within 360 days    Hemoglobin  Date Value Ref Range Status  02/14/2023 11.4 (L) 13.0 - 17.0 g/dL Final         Failed - HCT in normal range and within 360 days    HCT  Date Value Ref Range Status  02/14/2023 34.0 (L) 39.0 - 52.0 % Final         Failed - AST in normal range and within 360 days    AST  Date Value Ref Range Status  02/14/2023 10 (L) 15 - 41 U/L Final         Passed - WBC in normal range and within 360 days    WBC  Date Value Ref Range Status  02/14/2023 6.1 4.0 - 10.5 K/uL Final         Passed - Cr in normal range and within 360 days    Creat  Date Value Ref Range Status  02/12/2023 1.15 0.70 - 1.28 mg/dL Final   Creatinine, Ser  Date Value Ref Range Status  02/14/2023 1.10 0.61 - 1.24 mg/dL Final   Creatinine,U  Date Value Ref Range Status  03/20/2017 115.3 mg/dL Final   Creatinine, Urine  Date Value Ref Range Status  09/20/2021 209 20 - 320 mg/dL Final         Passed - ALT in normal range and within 360 days    ALT  Date Value Ref Range Status  02/14/2023 <5 0 - 44 U/L Final         Passed - Last BP in normal range    BP Readings from Last 1 Encounters:  02/14/23 128/78         Passed - Valid encounter within last 12 months    Recent Outpatient Visits           3 weeks ago Aortic atherosclerosis [facility])   [place] [facility] [place], Salvadore Oxford, NP   4 months ago Left-sided chest pain   [facility] [place], Salvadore Oxford, NP   8 months ago Encounter for general adult medical examination with abnormal findings   [place] [facility] [place], Salvadore Oxford, NP   10 months ago 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[facility])   [place] [facility] [place], Salvadore Oxford, NP   1 year ago Encounter for general adult medical examination with abnormal findings   [place] [facility] [place], Salvadore Oxford, NP

## 2023-07-01 DIAGNOSIS — R296 Repeated falls: Secondary | ICD-10-CM | POA: Diagnosis not present

## 2023-07-01 DIAGNOSIS — M5416 Radiculopathy, lumbar region: Secondary | ICD-10-CM | POA: Diagnosis not present

## 2023-07-01 DIAGNOSIS — G20A1 Parkinson's disease without dyskinesia, without mention of fluctuations: Secondary | ICD-10-CM | POA: Diagnosis not present

## 2023-07-01 DIAGNOSIS — F1120 Opioid dependence, uncomplicated: Secondary | ICD-10-CM | POA: Diagnosis not present

## 2023-07-17 ENCOUNTER — Other Ambulatory Visit: Payer: Self-pay | Admitting: Internal Medicine

## 2023-07-18 ENCOUNTER — Other Ambulatory Visit (HOSPITAL_COMMUNITY): Payer: Self-pay

## 2023-07-21 ENCOUNTER — Other Ambulatory Visit (HOSPITAL_COMMUNITY): Payer: Self-pay

## 2023-07-21 ENCOUNTER — Ambulatory Visit: Payer: Medicare HMO | Admitting: Podiatry

## 2023-07-21 MED ORDER — ATORVASTATIN CALCIUM 40 MG PO TABS: 40.0000 mg | ORAL_TABLET | Freq: Every day | ORAL | 2 refills | Status: DC

## 2023-07-21 MED ORDER — CARBIDOPA-LEVODOPA ER 25-100 MG PO TBCR: 1.0000 | EXTENDED_RELEASE_TABLET | Freq: Three times a day (TID) | ORAL | 1 refills | Status: DC

## 2023-07-21 MED ORDER — MELOXICAM 15 MG PO TABS: 15.0000 mg | ORAL_TABLET | Freq: Every day | ORAL | 2 refills | Status: DC

## 2023-07-21 MED ORDER — DIVALPROEX SODIUM ER 500 MG PO TB24: 1000.0000 mg | ORAL_TABLET | Freq: Every day | ORAL | 1 refills | Status: DC

## 2023-07-21 MED ORDER — TRAZODONE HCL 50 MG PO TABS: ORAL_TABLET | ORAL | 2 refills | Status: DC

## 2023-07-21 MED ORDER — DONEPEZIL HCL 10 MG PO TABS: 10.0000 mg | ORAL_TABLET | Freq: Every day | ORAL | 2 refills | Status: DC

## 2023-07-22 ENCOUNTER — Other Ambulatory Visit: Payer: Self-pay

## 2023-07-22 ENCOUNTER — Encounter: Payer: Self-pay | Admitting: Internal Medicine

## 2023-07-22 ENCOUNTER — Other Ambulatory Visit (HOSPITAL_COMMUNITY): Payer: Self-pay

## 2023-07-22 MED ORDER — CITALOPRAM HYDROBROMIDE 20 MG PO TABS: 20.0000 mg | ORAL_TABLET | Freq: Every day | ORAL | 0 refills | Status: DC

## 2023-07-22 NOTE — Telephone Encounter (Signed)
 Duplicate request Rx reordered today. Requested Prescriptions  Pending Prescriptions Disp Refills   citalopram  (CELEXA ) 20 MG tablet [Pharmacy Med Name: Citalopram  Hydrobromide Oral Tablet 20 MG] 90 tablet 3    Sig: TAKE 1 TABLET EVERY DAY     Psychiatry:  Antidepressants - SSRI Passed - 07/22/2023 11:28 AM      Passed - Valid encounter within last 6 months    Recent Outpatient Visits           1 month ago Aortic atherosclerosis [facility])   [place] [facility] [place], Angeline ORN, NP   5 months ago Left-sided chest pain   [place] [facility] [place], Angeline ORN, NP   9 months ago Encounter for general adult medical examination with abnormal findings   [place] [facility] [place], Angeline ORN, NP   11 months ago 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[facility])   [place] [facility] [place], Angeline ORN, NP   1 year ago Encounter for general adult medical examination with abnormal findings   [place] [facility] [place], Angeline ORN, NP

## 2023-07-24 ENCOUNTER — Other Ambulatory Visit (HOSPITAL_COMMUNITY): Payer: Self-pay

## 2023-07-29 ENCOUNTER — Other Ambulatory Visit: Payer: Self-pay | Admitting: Internal Medicine

## 2023-07-29 NOTE — Telephone Encounter (Signed)
 Requested medication (s) are due for refill today - yes  Requested medication (s) are on the active medication list -yes  Future visit scheduled -no  Last refill: 06/24/23 #45  Notes to clinic: non delegated Rx  Requested Prescriptions  Pending Prescriptions Disp Refills   LORazepam  (ATIVAN ) 0.5 MG tablet [Pharmacy Med Name: LORAZEPAM  0.5 MG TABLET] 45 tablet 0    Sig: TAKE 1 TO 2 TABLETS BY MOUTH TWICE A DAY AS NEEDED     Not Delegated - Psychiatry: Anxiolytics/Hypnotics 2 Failed - 07/29/2023 11:28 AM      Failed - This refill cannot be delegated      Failed - Urine Drug Screen completed in last 360 days      Passed - Patient is not pregnant      Passed - Valid encounter within last 6 months    Recent Outpatient Visits           1 month ago Aortic atherosclerosis [facility])   [place] [facility] [place], [place], NP   5 months ago Left-sided chest pain   [place] [facility] [place], Angeline ORN, NP   9 months ago Encounter for general adult medical examination with abnormal findings   [place] [facility] [place], Angeline ORN, NP   11 months ago 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[facility])   [place] [facility] [place], Angeline ORN, NP   1 year ago Encounter for general adult medical examination with abnormal findings   [place] [facility] [address], Angeline ORN, NP                 Requested Prescriptions  Pending Prescriptions Disp Refills   LORazepam  (ATIVAN ) 0.5 MG tablet [Pharmacy Med Name: LORAZEPAM  0.5 MG TABLET] 45 tablet 0    Sig: TAKE 1 TO 2 TABLETS BY MOUTH TWICE A DAY AS NEEDED     Not Delegated - Psychiatry: Anxiolytics/Hypnotics 2 Failed - 07/29/2023 11:28 AM      Failed - This refill cannot be delegated      Failed - Urine Drug Screen completed in last 360 days      Passed - Patient is not pregnant      Passed - Valid encounter within last 6 months    Recent Outpatient Visits            1 month ago Aortic atherosclerosis [facility])   [place] [facility] [place], [place], NP   5 months ago Left-sided chest pain   [place] [facility] [place], Angeline ORN, NP   9 months ago Encounter for general adult medical examination with abnormal findings   [place] [facility] [place], Angeline ORN, NP   11 months ago 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[facility])   [place] [facility] [place], Angeline ORN, NP   1 year ago Encounter for general adult medical examination with abnormal findings    [facility] [place], Angeline ORN, NP

## 2023-08-11 ENCOUNTER — Other Ambulatory Visit (HOSPITAL_COMMUNITY): Payer: Self-pay

## 2023-08-12 ENCOUNTER — Other Ambulatory Visit (HOSPITAL_COMMUNITY): Payer: Self-pay

## 2023-08-12 ENCOUNTER — Other Ambulatory Visit: Payer: Self-pay

## 2023-08-12 MED ORDER — HYDROCODONE-ACETAMINOPHEN 7.5-325 MG PO TABS
1.0000 | ORAL_TABLET | Freq: Four times a day (QID) | ORAL | 0 refills | Status: DC | PRN
  Filled ????-??-??: fill #0

## 2023-08-12 MED ORDER — CYCLOBENZAPRINE HCL 5 MG PO TABS
5.0000 mg | ORAL_TABLET | Freq: Two times a day (BID) | ORAL | 11 refills | Status: DC
  Filled 2023-08-12: qty 60, 30d supply, fill #0

## 2023-08-12 MED ORDER — GABAPENTIN 400 MG PO CAPS
ORAL_CAPSULE | ORAL | 11 refills | Status: DC
  Filled 2023-08-12: qty 90, 30d supply, fill #0

## 2023-08-12 MED ORDER — HYDROCODONE-ACETAMINOPHEN 7.5-325 MG PO TABS: 1.0000 | ORAL_TABLET | Freq: Four times a day (QID) | ORAL | 0 refills | Status: DC | PRN

## 2023-08-19 ENCOUNTER — Encounter: Payer: Self-pay | Admitting: Internal Medicine

## 2023-08-21 ENCOUNTER — Other Ambulatory Visit (HOSPITAL_COMMUNITY): Payer: Self-pay

## 2023-08-22 ENCOUNTER — Telehealth: Payer: Self-pay

## 2023-08-22 NOTE — Telephone Encounter (Signed)
Copied from CRM [id number]. Topic: General - Other >> Aug 22, 2023 12:15 PM Shon Hale wrote: Reason for CRM: Elly with medicare calling with decision for appeal. Appeal was declined for LORazepam (ATIVAN) 0.5 MG tablet. Denial letter will be sent to office via fax with additional information.

## 2023-08-22 NOTE — Telephone Encounter (Signed)
Is there a phone number for a peer to peer review that I can do?

## 2023-08-27 ENCOUNTER — Other Ambulatory Visit: Payer: Self-pay | Admitting: Internal Medicine

## 2023-08-28 NOTE — Telephone Encounter (Signed)
 Requested Prescriptions  Pending Prescriptions Disp Refills   lisinopril  (ZESTRIL ) 5 MG tablet [Pharmacy Med Name: LISINOPRIL  5 MG TABLET] 90 tablet 0    Sig: TAKE 1 TABLET (5 MG TOTAL) BY MOUTH DAILY.     Cardiovascular:  ACE Inhibitors Failed - 08/28/2023 12:19 PM      Failed - Cr in normal range and within 180 days    Creat  Date Value Ref Range Status  02/12/2023 1.15 0.70 - 1.28 mg/dL Final   Creatinine, Ser  Date Value Ref Range Status  02/14/2023 1.10 0.61 - 1.24 mg/dL Final   Creatinine,U  Date Value Ref Range Status  03/20/2017 115.3 mg/dL Final   Creatinine, Urine  Date Value Ref Range Status  09/20/2021 209 20 - 320 mg/dL Final         Failed - K in normal range and within 180 days    Potassium  Date Value Ref Range Status  02/14/2023 5.0 3.5 - 5.1 mmol/L Final         Passed - Patient is not pregnant      Passed - Last BP in normal range    BP Readings from Last 1 Encounters:  02/14/23 128/78         Passed - Valid encounter within last 6 months    Recent Outpatient Visits           2 months ago Aortic atherosclerosis [facility])   [place] [facility] [place], Angeline ORN, NP   6 months ago Left-sided chest pain   [place] [facility] [place], Angeline ORN, NP   10 months ago Encounter for general adult medical examination with abnormal findings   [place] [facility] [place], Angeline ORN, NP   1 year ago 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[facility])   [place] [facility] [place], Angeline ORN, NP   1 year ago Encounter for general adult medical examination with abnormal findings   [place] [facility] [place], Angeline ORN, NP

## 2023-09-02 ENCOUNTER — Other Ambulatory Visit: Payer: Self-pay | Admitting: Internal Medicine

## 2023-09-03 NOTE — Telephone Encounter (Signed)
Requested medication (s) are due for refill today: yes  Requested medication (s) are on the active medication list: yes  Last refill:  07/29/23 #45  Future visit scheduled: no  Notes to clinic:  med not delegated to NT    Requested Prescriptions  Pending Prescriptions Disp Refills   LORazepam (ATIVAN) 0.5 MG tablet [Pharmacy Med Name: LORAZEPAM 0.5 MG TABLET] 45 tablet     Sig: TAKE 1 TO 2 TABLETS BY MOUTH TWICE A DAY AS NEEDED     Not Delegated - Psychiatry: Anxiolytics/Hypnotics 2 Failed - 09/03/2023 12:55 PM      Failed - This refill cannot be delegated      Failed - Urine Drug Screen completed in last 360 days      Passed - Patient is not pregnant      Passed - Valid encounter within last 6 months    Recent Outpatient Visits           3 months ago Aortic atherosclerosis [facility])   [place] [place], [place], NP   6 months ago Left-sided chest pain   [place] [facility], Salvadore Oxford, NP   10 months ago Encounter for general adult medical examination with abnormal findings   [place] [facility] [place], Salvadore Oxford, NP   1 year ago 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[facility])   [place] [place], Salvadore Oxford, NP   1 year ago Encounter for general adult medical examination with abnormal findings   [place] [facility] [place], Salvadore Oxford, NP              Signed Prescriptions Disp Refills   tamsulosin (FLOMAX) 0.4 MG CAPS capsule 90 capsule 0    Sig: TAKE 1 CAPSULE BY MOUTH EVERY DAY     Urology: Alpha-Adrenergic Blocker Passed - 09/03/2023 12:55 PM      Passed - PSA in normal range and within 360 days    PSA  Date Value Ref Range Status  10/17/2022 0.69 < OR = 4.00 ng/mL Final    Comment:    The total PSA value from this assay system is  standardized against the WHO standard. The test  result will be approximately 20% lower when compared  to the  equimolar-standardized total PSA (Beckman  Coulter). Comparison of serial PSA results should be  interpreted with this fact in mind. . This test was performed using the Siemens  chemiluminescent method. Values obtained from  different assay methods cannot be used interchangeably. PSA levels, regardless of value, should not be interpreted as absolute evidence of the presence or absence of disease.   06/01/2019 0.56 0.10 - 4.00 ng/ml Final    Comment:    Test performed using Access Hybritech PSA Assay, a parmagnetic partical, chemiluminecent immunoassay.         Passed - Last BP in normal range    BP Readings from Last 1 Encounters:  02/14/23 128/78         Passed - Valid encounter within last 12 months    Recent Outpatient Visits           3 months ago [facility]ortic [facility])   [place] [facility] [place], Salvadore Oxford, NP   6 months ago Left-sided chest pain   [place] [facility] [place], Salvadore Oxford, NP   10 months ago Encounter for general adult medical examination with abnormal findings  [place] [facility] [place], Salvadore Oxford, NP   1 year ago 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[facility])   [place] [facility] [place], Salvadore Oxford, NP   1 year ago Encounter for general adult medical examination with abnormal findings   [place] [facility] [place], Salvadore Oxford, NP

## 2023-09-03 NOTE — Telephone Encounter (Signed)
Requested Prescriptions  Pending Prescriptions Disp Refills   tamsulosin (FLOMAX) 0.4 MG CAPS capsule [Pharmacy Med Name: TAMSULOSIN HCL 0.4 MG CAPSULE] 90 capsule 0    Sig: TAKE 1 CAPSULE BY MOUTH EVERY DAY     Urology: Alpha-Adrenergic Blocker Passed - 09/03/2023 12:54 PM      Passed - PSA in normal range and within 360 days    PSA  Date Value Ref Range Status  10/17/2022 0.69 < OR = 4.00 ng/mL Final    Comment:    The total PSA value from this assay system is  standardized against the WHO standard. The test  result will be approximately 20% lower when compared  to the equimolar-standardized total PSA (Beckman  Coulter). Comparison of serial PSA results should be  interpreted with this fact in mind. . This test was performed using the Siemens  chemiluminescent method. Values obtained from  different assay methods cannot be used interchangeably. PSA levels, regardless of value, should not be interpreted as absolute evidence of the presence or absence of disease.   06/01/2019 0.56 0.10 - 4.00 ng/ml Final    Comment:    Test performed using Access Hybritech PSA Assay, a parmagnetic partical, chemiluminecent immunoassay.         Passed - Last BP in normal range    BP Readings from Last 1 Encounters:  02/14/23 128/78         Passed - Valid encounter within last 12 months    Recent Outpatient Visits           3 months ago Aortic atherosclerosis [facility])   [place] [facility] [place], Salvadore Oxford, NP   6 months ago Left-sided chest pain   [place] [facility] [place], Salvadore Oxford, NP   10 months ago Encounter for general adult medical examination with abnormal findings   [place] [facility] [place], Salvadore Oxford, NP   1 year ago 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[facility])   [place] [facility] [place], Salvadore Oxford, NP   1 year ago Encounter for general adult medical examination with abnormal findings   [place] [facility] [place], Salvadore Oxford, NP               LORazepam (ATIVAN) 0.5 MG tablet [Pharmacy Med Name: LORAZEPAM 0.5 MG TABLET] 45 tablet     Sig: TAKE 1 TO 2 TABLETS BY MOUTH TWICE A DAY AS NEEDED     Not Delegated - Psychiatry: Anxiolytics/Hypnotics 2 Failed - 09/03/2023 12:54 PM      Failed - This refill cannot be delegated      Failed - Urine Drug Screen completed in last 360 days      Passed - Patient is not pregnant      Passed - Valid encounter within last 6 months    Recent Outpatient Visits           3 months ago Aortic atherosclerosis [facility])   [place] [facility] [place], Salvadore Oxford, NP   6 months ago Left-sided chest pain   [place] [facility] [place], Salvadore Oxford, NP   10 months ago Encounter for general adult medical examination with abnormal findings   [place] [facility] [place], Salvadore Oxford, NP   1 year ago 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[facility])    [facility] [place], Salvadore Oxford, NP   1 year ago Encounter for  general adult medical examination with abnormal findings   [place] [facility] [place], Salvadore [place], [place]

## 2023-09-16 ENCOUNTER — Other Ambulatory Visit: Payer: Self-pay | Admitting: Internal Medicine

## 2023-09-17 LAB — HM DIABETES EYE EXAM

## 2023-09-17 NOTE — Telephone Encounter (Signed)
 Requested Prescriptions  Pending Prescriptions Disp Refills   memantine (NAMENDA XR) 14 MG CP24 24 hr capsule [Pharmacy Med Name: Memantine HCl ER Oral Capsule Extended Release 24 Hour 14 MG] 90 capsule 0    Sig: TAKE 1 CAPSULE EVERY DAY     Neurology:  Alzheimer's Agents 2 Passed - 09/17/2023  1:50 PM      Passed - Cr in normal range and within 360 days    Creat  Date Value Ref Range Status  02/12/2023 1.15 0.70 - 1.28 mg/dL Final   Creatinine, Ser  Date Value Ref Range Status  02/14/2023 1.10 0.61 - 1.24 mg/dL Final   Creatinine,U  Date Value Ref Range Status  03/20/2017 115.3 mg/dL Final   Creatinine, Urine  Date Value Ref Range Status  09/20/2021 209 20 - 320 mg/dL Final         Passed - eGFR is 5 or above and within 360 days    GFR calc Af Amer  Date Value Ref Range Status  12/25/2019 >60 >60 mL/min Final   GFR, Estimated  Date Value Ref Range Status  02/14/2023 >60 >60 mL/min Final    Comment:    (NOTE) Calculated using the CKD-EPI Creatinine Equation (2021)    GFR  Date Value Ref Range Status  01/06/2020 59.40 (L) >60.00 mL/min Final   eGFR  Date Value Ref Range Status  02/12/2023 65 > OR = 60 mL/min/1.66m2 Final         Passed - Valid encounter within last 6 months    Recent Outpatient Visits           3 months ago Aortic atherosclerosis [facility])   [place] [facility] [place], Salvadore Oxford, NP   7 months ago Left-sided chest pain   [facility] [place], Salvadore Oxford, NP   11 months ago Encounter for general adult medical examination with abnormal findings   [place] [facility] [place], Salvadore Oxford, NP   1 year ago 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[facility])   [place] [facility] [place], Salvadore Oxford, NP   1 year ago Encounter for general adult medical examination with abnormal findings   [place] [facility] [place], Salvadore Oxford, NP

## 2023-09-25 ENCOUNTER — Other Ambulatory Visit: Payer: Self-pay | Admitting: Internal Medicine

## 2023-09-26 ENCOUNTER — Other Ambulatory Visit: Payer: Self-pay | Admitting: Internal Medicine

## 2023-09-26 NOTE — Telephone Encounter (Signed)
 Requested Prescriptions  Pending Prescriptions Disp Refills   citalopram (CELEXA) 20 MG tablet [Pharmacy Med Name: Citalopram Hydrobromide Oral Tablet 20 MG] 90 tablet 0    Sig: TAKE 1 TABLET EVERY DAY     Psychiatry:  Antidepressants - SSRI Passed - 09/26/2023 12:59 PM      Passed - Valid encounter within last 6 months    Recent Outpatient Visits           3 months ago Aortic atherosclerosis [facility])   [place] [facility] [place], Salvadore Oxford, NP   7 months ago Left-sided chest pain   [place] [facility] [place], Salvadore Oxford, NP   11 months ago Encounter for general adult medical examination with abnormal findings   [place] [facility] [place], Salvadore Oxford, NP   1 year ago 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[facility])   [place] [facility] [place], Salvadore Oxford, NP   2 years ago Encounter for general adult medical examination with abnormal findings   [place] [facility] [place], Salvadore Oxford, NP

## 2023-09-26 NOTE — Telephone Encounter (Signed)
 Requested Prescriptions  Pending Prescriptions Disp Refills   mirtazapine (REMERON) 45 MG tablet [Pharmacy Med Name: Mirtazapine Oral Tablet 45 MG] 90 tablet 0    Sig: TAKE 1 TABLET AT BEDTIME     Psychiatry: Antidepressants - mirtazapine Passed - 09/26/2023 11:26 AM      Passed - Valid encounter within last 6 months    Recent Outpatient Visits           3 months ago Aortic atherosclerosis [facility])   [facility] [place], Salvadore Oxford, NP   7 months ago Left-sided chest pain   [place], Salvadore Oxford, NP   11 months ago Encounter for general adult medical examination with abnormal findings   [place] [facility] [place], Salvadore Oxford, NP   1 year ago 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[facility])   [place] [place], Salvadore Oxford, NP   2 years ago Encounter for general adult medical examination with abnormal findings   [place] [place], Salvadore Oxford, NP               metFORMIN (GLUCOPHAGE) 500 MG tablet [Pharmacy Med Name: metFORMIN HCl Oral Tablet 500 MG] 180 tablet     Sig: TAKE 1 TABLET TWICE DAILY WITH MEALS     Endocrinology:  Diabetes - Biguanides Failed - 09/26/2023 11:26 AM      Failed - HBA1C is between 0 and 7.9 and within 180 days    Hgb A1c MFr Bld  Date Value Ref Range Status  08/21/2022 6.5 (H) <5.7 % of total Hgb Final    Comment:    For someone without known diabetes, a hemoglobin A1c value of 6.5% or greater indicates that they may have  diabetes and this should be confirmed with a follow-up  test. . For someone with known diabetes, a value <7% indicates  that their diabetes is well controlled and a value  greater than or equal to 7% indicates suboptimal  control. A1c targets should be individualized based on  duration of diabetes, age, comorbid conditions, and  other considerations. . Currently, no consensus exists regarding use of hemoglobin A1c  for diagnosis of diabetes for children. .          Failed - B12 Level in normal range and within 720 days    Vitamin B-12  Date Value Ref Range Status  12/21/2019 325 180 - 914 pg/mL Final    Comment:    (NOTE) This assay is not validated for testing neonatal or myeloproliferative syndrome specimens for Vitamin B12 levels. Performed at [facility], [address] [place], [place] [address]          Passed - Cr in normal range and within 360 days    Creat  Date Value Ref Range Status  02/12/2023 1.15 0.70 - 1.28 mg/dL Final   Creatinine, Ser  Date Value Ref Range Status  02/14/2023 1.10 0.61 - 1.24 mg/dL Final   Creatinine,U  Date Value Ref Range Status  03/20/2017 115.3 mg/dL Final   Creatinine, Urine  Date Value Ref Range Status  09/20/2021 209 20 - 320 mg/dL Final         Passed - eGFR in normal range and within 360 days    GFR calc Af Amer  Date Value Ref Range Status  12/25/2019 >60 >60 mL/min Final   GFR, Estimated  Date Value Ref Range Status  02/14/2023 >  60 >60 mL/min Final    Comment:    (NOTE) Calculated using the CKD-EPI Creatinine Equation (2021)    GFR  Date Value Ref Range Status  01/06/2020 59.40 (L) >60.00 mL/min Final   eGFR  Date Value Ref Range Status  02/12/2023 65 > OR = 60 mL/min/1.27m2 Final         Passed - Valid encounter within last 6 months    Recent Outpatient Visits           3 months ago Aortic atherosclerosis [facility])   [place] [facility] [place], Salvadore Oxford, NP   7 months ago Left-sided chest pain   [place] [facility] [place], Salvadore Oxford, NP   11 months ago Encounter for general adult medical examination with abnormal findings   [place] [place], Salvadore Oxford, NP   1 year ago 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[facility])   [place] [facility] [place], Salvadore Oxford, NP   2 years ago Encounter for general adult medical examination with abnormal  findings   [facility] [place], Salvadore Oxford, NP              Passed - CBC within normal limits and completed in the last 12 months    WBC  Date Value Ref Range Status  02/14/2023 6.1 4.0 - 10.5 K/uL Final   RBC  Date Value Ref Range Status  02/14/2023 3.47 (L) 4.22 - 5.81 MIL/uL Final   Hemoglobin  Date Value Ref Range Status  02/14/2023 11.4 (L) 13.0 - 17.0 g/dL Final   HCT  Date Value Ref Range Status  02/14/2023 34.0 (L) 39.0 - 52.0 % Final   MCHC  Date Value Ref Range Status  02/14/2023 33.5 30.0 - 36.0 g/dL Final   [facility]  Date Value Ref Range Status  02/14/2023 32.9 26.0 - 34.0 pg Final   MCV  Date Value Ref Range Status  02/14/2023 98.0 80.0 - 100.0 fL Final   No results found for: "PLTCOUNTKUC", "LABPLAT", "POCPLA" RDW  Date Value Ref Range Status  02/14/2023 12.3 11.5 - 15.5 % Final  10/11/2014 13.5 12.3 - 15.4 % Final

## 2023-09-26 NOTE — Telephone Encounter (Signed)
 Requested medication (s) are due for refill today: yes  Requested medication (s) are on the active medication list: yes  Last refill:  05/20/23 #180  Future visit scheduled: no  Notes to clinic:  overdue labs   Requested Prescriptions  Pending Prescriptions Disp Refills   metFORMIN (GLUCOPHAGE) 500 MG tablet [Pharmacy Med Name: metFORMIN HCl Oral Tablet 500 MG] 180 tablet     Sig: TAKE 1 TABLET TWICE DAILY WITH MEALS     Endocrinology:  Diabetes - Biguanides Failed - 09/26/2023 11:30 AM      Failed - HBA1C is between 0 and 7.9 and within 180 days    Hgb A1c MFr Bld  Date Value Ref Range Status  08/21/2022 6.5 (H) <5.7 % of total Hgb Final    Comment:    For someone without known diabetes, a hemoglobin A1c value of 6.5% or greater indicates that they may have  diabetes and this should be confirmed with a follow-up  test. . For someone with known diabetes, a value <7% indicates  that their diabetes is well controlled and a value  greater than or equal to 7% indicates suboptimal  control. A1c targets should be individualized based on  duration of diabetes, age, comorbid conditions, and  other considerations. . Currently, no consensus exists regarding use of hemoglobin A1c for diagnosis of diabetes for children. .          Failed - B12 Level in normal range and within 720 days    Vitamin B-12  Date Value Ref Range Status  12/21/2019 325 180 - 914 pg/mL Final    Comment:    (NOTE) This assay is not validated for testing neonatal or myeloproliferative syndrome specimens for Vitamin B12 levels. Performed at [facility], [address], [place], [place] [address]          Passed - Cr in normal range and within 360 days    Creat  Date Value Ref Range Status  02/12/2023 1.15 0.70 - 1.28 mg/dL Final   Creatinine, Ser  Date Value Ref Range Status  02/14/2023 1.10 0.61 - 1.24 mg/dL Final   Creatinine,U  Date Value Ref Range Status  03/20/2017 115.3 mg/dL Final    Creatinine, Urine  Date Value Ref Range Status  09/20/2021 209 20 - 320 mg/dL Final         Passed - eGFR in normal range and within 360 days    GFR calc Af Amer  Date Value Ref Range Status  12/25/2019 >60 >60 mL/min Final   GFR, Estimated  Date Value Ref Range Status  02/14/2023 >60 >60 mL/min Final    Comment:    (NOTE) Calculated using the CKD-EPI Creatinine Equation (2021)    GFR  Date Value Ref Range Status  01/06/2020 59.40 (L) >60.00 mL/min Final   eGFR  Date Value Ref Range Status  02/12/2023 65 > OR = 60 mL/min/1.36m2 Final         Passed - Valid encounter within last 6 months    Recent Outpatient Visits           3 months ago Aortic atherosclerosis [facility])   [facility] [place], Salvadore Oxford, NP   7 months ago Left-sided chest pain   [place] [place], Salvadore Oxford, NP   11 months ago Encounter for general adult medical examination with abnormal findings   [place] [facility] [place], Salvadore Oxford, NP   1  year ago 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[facility])   [place] [facility] [place], [place] W, NP   2 years ago Encounter for general adult medical examination with abnormal findings   [place] [facility] [place], Salvadore Oxford, NP              Passed - CBC within normal limits and completed in the last 12 months    WBC  Date Value Ref Range Status  02/14/2023 6.1 4.0 - 10.5 K/uL Final   RBC  Date Value Ref Range Status  02/14/2023 3.47 (L) 4.22 - 5.81 MIL/uL Final   Hemoglobin  Date Value Ref Range Status  02/14/2023 11.4 (L) 13.0 - 17.0 g/dL Final   HCT  Date Value Ref Range Status  02/14/2023 34.0 (L) 39.0 - 52.0 % Final   MCHC  Date Value Ref Range Status  02/14/2023 33.5 30.0 - 36.0 g/dL Final   [facility]  Date Value Ref Range Status  02/14/2023 32.9 26.0 - 34.0 pg Final   MCV  Date Value Ref Range Status  02/14/2023 98.0 80.0 - 100.0 fL Final    No results found for: "PLTCOUNTKUC", "LABPLAT", "POCPLA" RDW  Date Value Ref Range Status  02/14/2023 12.3 11.5 - 15.5 % Final  10/11/2014 13.5 12.3 - 15.4 % Final         Signed Prescriptions Disp Refills   mirtazapine (REMERON) 45 MG tablet 90 tablet 0    Sig: TAKE 1 TABLET AT BEDTIME     Psychiatry: Antidepressants - mirtazapine Passed - 09/26/2023 11:30 AM      Passed - Valid encounter within last 6 months    Recent Outpatient Visits           3 months ago Aortic atherosclerosis [facility])   [place] [facility] [place], Salvadore Oxford, NP   7 months ago Left-sided chest pain   [place] [facility] [place], Salvadore Oxford, NP   11 months ago Encounter for general adult medical examination with abnormal findings   [place] [facility] [place], Salvadore Oxford, NP   1 year ago 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[facility])   [place] [facility] [place], Salvadore Oxford, NP   2 years ago Encounter for general adult medical examination with abnormal findings   [place] [facility] [place], Salvadore Oxford, NP

## 2023-10-02 ENCOUNTER — Other Ambulatory Visit: Payer: Self-pay | Admitting: Internal Medicine

## 2023-10-03 NOTE — Telephone Encounter (Signed)
 Requested medication (s) are due for refill today: Lisinopril -no  Requested medication (s) are on the active medication list: Yes  Last refill:    Future visit scheduled: Yes  Notes to clinic:  Not delegated.    Requested Prescriptions  Pending Prescriptions Disp Refills   lisinopril (ZESTRIL) 5 MG tablet [Pharmacy Med Name: LISINOPRIL 5 MG TABLET] 90 tablet 0    Sig: TAKE 1 TABLET (5 MG TOTAL) BY MOUTH DAILY.     Cardiovascular:  ACE Inhibitors Failed - 10/03/2023 11:43 AM      Failed - Cr in normal range and within 180 days    Creat  Date Value Ref Range Status  02/12/2023 1.15 0.70 - 1.28 mg/dL Final   Creatinine, Ser  Date Value Ref Range Status  02/14/2023 1.10 0.61 - 1.24 mg/dL Final   Creatinine,U  Date Value Ref Range Status  03/20/2017 115.3 mg/dL Final   Creatinine, Urine  Date Value Ref Range Status  09/20/2021 209 20 - 320 mg/dL Final         Failed - K in normal range and within 180 days    Potassium  Date Value Ref Range Status  02/14/2023 5.0 3.5 - 5.1 mmol/L Final         Passed - Patient is not pregnant      Passed - Last BP in normal range    BP Readings from Last 1 Encounters:  02/14/23 128/78         Passed - Valid encounter within last 6 months    Recent Outpatient Visits           4 months ago Aortic atherosclerosis [facility])   [facility] [place], Salvadore Oxford, NP   7 months ago Left-sided chest pain   [place] [facility] [place], Salvadore Oxford, NP   11 months ago Encounter for general adult medical examination with abnormal findings   [place] [facility] [place], Salvadore Oxford, NP   1 year ago 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[facility])   [place] [facility] [place], Salvadore Oxford, NP   2 years ago Encounter for general adult medical examination with abnormal findings   [place] [facility] [place], [place] W, NP               LORazepam (ATIVAN) 0.5 MG  tablet [Pharmacy Med Name: LORAZEPAM 0.5 MG TABLET] 45 tablet 0    Sig: TAKE 1 TO 2 TABLETS BY MOUTH TWICE A DAY AS NEEDED     Not Delegated - Psychiatry: Anxiolytics/Hypnotics 2 Failed - 10/03/2023 11:43 AM      Failed - This refill cannot be delegated      Failed - Urine Drug Screen completed in last 360 days      Passed - Patient is not pregnant      Passed - Valid encounter within last 6 months    Recent Outpatient Visits           4 months ago Aortic atherosclerosis [facility])   [place] [facility] [place], Salvadore Oxford, NP   7 months ago Left-sided chest pain   [facility] [place], Salvadore Oxford, NP   11 months ago Encounter for general adult medical examination with abnormal findings   [place] [facility] [place], Salvadore Oxford, NP   1 year ago 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[facility])    [facility] [place], Salvadore Oxford, NP   2 years ago Encounter for general adult medical examination with abnormal findings   [place] [place], Salvadore Oxford, NP              Signed Prescriptions Disp Refills   pantoprazole (PROTONIX) 20 MG tablet 90 tablet 1    Sig: TAKE 1 TABLET BY MOUTH EVERY DAY     Gastroenterology: Proton Pump Inhibitors Passed - 10/03/2023 11:43 AM      Passed - Valid encounter within last 12 months    Recent Outpatient Visits           4 months ago Aortic atherosclerosis [facility])   [place] [facility] [place], Salvadore Oxford, NP   7 months ago Left-sided chest pain   [place] [place], Salvadore Oxford, NP   11 months ago Encounter for general adult medical examination with abnormal findings   [place] [facility] [place], Salvadore Oxford, NP   1 year ago 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[facility])   [place] [facility] [place], Salvadore Oxford, NP   2 years ago Encounter for general adult medical examination with  abnormal findings   [place] [facility] [place], Salvadore Oxford, NP

## 2023-10-03 NOTE — Telephone Encounter (Signed)
 Requested Prescriptions  Pending Prescriptions Disp Refills   pantoprazole (PROTONIX) 20 MG tablet [Pharmacy Med Name: PANTOPRAZOLE SOD DR 20 MG TAB] 90 tablet 1    Sig: TAKE 1 TABLET BY MOUTH EVERY DAY     Gastroenterology: Proton Pump Inhibitors Passed - 10/03/2023 11:42 AM      Passed - Valid encounter within last 12 months    Recent Outpatient Visits           4 months ago Aortic [facility])   [place] [facility] [place], Salvadore Oxford, NP   7 months ago Left-sided chest pain   [place] [facility] [place], Salvadore Oxford, NP   11 months ago Encounter for general adult medical examination with abnormal findings   [place] [facility] [place], Salvadore Oxford, NP   1 year ago 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[facility])   [place] [facility] [place], Salvadore Oxford, NP   2 years ago Encounter for general adult medical examination with abnormal findings   [place] [facility] [place], Salvadore Oxford, NP               lisinopril (ZESTRIL) 5 MG tablet [Pharmacy Med Name: LISINOPRIL 5 MG TABLET] 90 tablet 0    Sig: TAKE 1 TABLET (5 MG TOTAL) BY MOUTH DAILY.     Cardiovascular:  ACE Inhibitors Failed - 10/03/2023 11:42 AM      Failed - Cr in normal range and within 180 days    Creat  Date Value Ref Range Status  02/12/2023 1.15 0.70 - 1.28 mg/dL Final   Creatinine, Ser  Date Value Ref Range Status  02/14/2023 1.10 0.61 - 1.24 mg/dL Final   Creatinine,U  Date Value Ref Range Status  03/20/2017 115.3 mg/dL Final   Creatinine, Urine  Date Value Ref Range Status  09/20/2021 209 20 - 320 mg/dL Final         Failed - K in normal range and within 180 days    Potassium  Date Value Ref Range Status  02/14/2023 5.0 3.5 - 5.1 mmol/L Final         Passed - Patient is not pregnant      Passed - Last BP in normal range    BP Readings from Last 1 Encounters:  02/14/23 128/78         Passed - Valid encounter  within last 6 months    Recent Outpatient Visits           4 months ago Aortic atherosclerosis [facility])   [place] [facility] [place], Salvadore Oxford, NP   7 months ago Left-sided chest pain   [place] [facility] [place], Salvadore Oxford, NP   11 months ago Encounter for general adult medical examination with abnormal findings   [place] [place], Salvadore Oxford, NP   1 year ago 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[facility])   [place] [facility] [place], Salvadore Oxford, NP   2 years ago Encounter for general adult medical examination with abnormal findings   [place] [facility] [place], Salvadore Oxford, NP               LORazepam (ATIVAN) 0.5 MG tablet [Pharmacy Med Name: LORAZEPAM 0.5 MG TABLET] 45 tablet 0    Sig: TAKE 1 TO 2 TABLETS BY MOUTH TWICE A DAY AS NEEDED     Not Delegated -  Psychiatry: Anxiolytics/Hypnotics 2 Failed - 10/03/2023 11:42 AM      Failed - This refill cannot be delegated      Failed - Urine Drug Screen completed in last 360 days      Passed - Patient is not pregnant      Passed - Valid encounter within last 6 months    Recent Outpatient Visits           4 months ago Aortic atherosclerosis [facility])   [place] [facility] [place], Salvadore Oxford, NP   7 months ago Left-sided chest pain   [place] [facility] [place], Salvadore Oxford, NP   11 months ago Encounter for general adult medical examination with abnormal findings   [place] [facility] [place], Salvadore Oxford, NP   1 year ago 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[facility])   [place] [facility] [place], Salvadore Oxford, NP   2 years ago Encounter for general adult medical examination with abnormal findings   [place] [facility] [place], Salvadore Oxford, NP

## 2023-11-03 ENCOUNTER — Other Ambulatory Visit: Payer: Self-pay | Admitting: Internal Medicine

## 2023-11-04 NOTE — Telephone Encounter (Signed)
 Requested medications are due for refill today.  yes  Requested medications are on the active medications list.  yes  Last refill. 08/28/2023 #90 0 rf  Future visit scheduled.   yes  Notes to clinic.  Pt last seen in clinic 02/12/2023. Please advise.    Requested Prescriptions  Pending Prescriptions Disp Refills   lisinopril (ZESTRIL) 5 MG tablet [Pharmacy Med Name: LISINOPRIL 5 MG TABLET] 90 tablet 0    Sig: TAKE 1 TABLET (5 MG TOTAL) BY MOUTH DAILY.     Cardiovascular:  ACE Inhibitors Failed - 11/04/2023 12:41 PM      Failed - Cr in normal range and within 180 days    Creat  Date Value Ref Range Status  02/12/2023 1.15 0.70 - 1.28 mg/dL Final   Creatinine, Ser  Date Value Ref Range Status  02/14/2023 1.10 0.61 - 1.24 mg/dL Final   Creatinine,U  Date Value Ref Range Status  03/20/2017 115.3 mg/dL Final   Creatinine, Urine  Date Value Ref Range Status  09/20/2021 209 20 - 320 mg/dL Final         Failed - K in normal range and within 180 days    Potassium  Date Value Ref Range Status  02/14/2023 5.0 3.5 - 5.1 mmol/L Final         Failed - Valid encounter within last 6 months    Recent Outpatient Visits   None            Passed - Patient is not pregnant      Passed - Last BP in normal range    BP Readings from Last 1 Encounters:  02/14/23 128/78

## 2023-11-06 ENCOUNTER — Other Ambulatory Visit: Payer: Self-pay | Admitting: Internal Medicine

## 2023-11-07 ENCOUNTER — Other Ambulatory Visit: Payer: Self-pay

## 2023-11-07 MED ORDER — ATORVASTATIN CALCIUM 40 MG PO TABS: 40.0000 mg | ORAL_TABLET | Freq: Every day | ORAL | 3 refills | Status: AC

## 2023-11-07 NOTE — Telephone Encounter (Signed)
 Too soon for refill. Last RF 09/26/23 for 90 days.  Requested Prescriptions  Pending Prescriptions Disp Refills   citalopram  (CELEXA ) 20 MG tablet [Pharmacy Med Name: Citalopram  Hydrobromide Oral Tablet 20 MG] 90 tablet 3    Sig: TAKE 1 TABLET EVERY DAY     Psychiatry:  Antidepressants - SSRI Failed - 11/07/2023 12:13 PM      Failed - Valid encounter within last 6 months    Recent Outpatient Visits   None

## 2023-11-24 ENCOUNTER — Other Ambulatory Visit: Payer: Self-pay | Admitting: Internal Medicine

## 2023-11-26 NOTE — Telephone Encounter (Signed)
 Requested medication (s) are due for refill today: yes  Requested medication (s) are on the active medication list: yes  Last refill:  10/03/23, 09/06/23  Future visit scheduled: no  Notes to clinic:  Unable to refill per protocol, cannot delegate.      Requested Prescriptions  Pending Prescriptions Disp Refills   lisinopril  (ZESTRIL ) 5 MG tablet [Pharmacy Med Name: LISINOPRIL  5 MG TABLET] 90 tablet 0    Sig: TAKE 1 TABLET (5 MG TOTAL) BY MOUTH DAILY.     Cardiovascular:  ACE Inhibitors Failed - 11/26/2023 10:57 AM      Failed - Cr in normal range and within 180 days    Creat  Date Value Ref Range Status  02/12/2023 1.15 0.70 - 1.28 mg/dL Final   Creatinine, Ser  Date Value Ref Range Status  02/14/2023 1.10 0.61 - 1.24 mg/dL Final   Creatinine,U  Date Value Ref Range Status  03/20/2017 115.3 mg/dL Final   Creatinine, Urine  Date Value Ref Range Status  09/20/2021 209 20 - 320 mg/dL Final         Failed - K in normal range and within 180 days    Potassium  Date Value Ref Range Status  02/14/2023 5.0 3.5 - 5.1 mmol/L Final         Failed - Valid encounter within last 6 months    Recent Outpatient Visits   None            Passed - Patient is not pregnant      Passed - Last BP in normal range    BP Readings from Last 1 Encounters:  02/14/23 128/78          LORazepam  (ATIVAN ) 0.5 MG tablet [Pharmacy Med Name: LORAZEPAM  0.5 MG TABLET] 45 tablet 0    Sig: TAKE 1 TO 2 TABLETS BY MOUTH TWICE A DAY AS NEEDED     Not Delegated - Psychiatry: Anxiolytics/Hypnotics 2 Failed - 11/26/2023 10:57 AM      Failed - This refill cannot be delegated      Failed - Urine Drug Screen completed in last 360 days      Failed - Valid encounter within last 6 months    Recent Outpatient Visits   None            Passed - Patient is not pregnant

## 2023-11-29 ENCOUNTER — Other Ambulatory Visit: Payer: Self-pay | Admitting: Internal Medicine

## 2023-12-01 ENCOUNTER — Other Ambulatory Visit: Payer: Self-pay | Admitting: Internal Medicine

## 2023-12-01 NOTE — Telephone Encounter (Signed)
 OV 06/04/23 Attempted to call patient - no answer- left message to call office for appointment, Courtesy refill given Requested Prescriptions  Pending Prescriptions Disp Refills   memantine  (NAMENDA  XR) 14 MG CP24 24 hr capsule [Pharmacy Med Name: Memantine  HCl ER Oral Capsule Extended Release 24 Hour 14 MG] 90 capsule 3    Sig: TAKE 1 CAPSULE EVERY DAY     Neurology:  Alzheimer's Agents 2 Failed - 12/01/2023  4:11 PM      Failed - Valid encounter within last 6 months    Recent Outpatient Visits   None            Passed - Cr in normal range and within 360 days    Creat  Date Value Ref Range Status  02/12/2023 1.15 0.70 - 1.28 mg/dL Final   Creatinine, Ser  Date Value Ref Range Status  02/14/2023 1.10 0.61 - 1.24 mg/dL Final   Creatinine,U  Date Value Ref Range Status  03/20/2017 115.3 mg/dL Final   Creatinine, Urine  Date Value Ref Range Status  09/20/2021 209 20 - 320 mg/dL Final         Passed - eGFR is 5 or above and within 360 days    GFR calc Af Amer  Date Value Ref Range Status  12/25/2019 >60 >60 mL/min Final   GFR, Estimated  Date Value Ref Range Status  02/14/2023 >60 >60 mL/min Final    Comment:    (NOTE) Calculated using the CKD-EPI Creatinine Equation (2021)    GFR  Date Value Ref Range Status  01/06/2020 59.40 (L) >60.00 mL/min Final   eGFR  Date Value Ref Range Status  02/12/2023 65 > OR = 60 mL/min/1.55m2 Final

## 2023-12-03 NOTE — Telephone Encounter (Signed)
 Requested medication (s) are due for refill today:   Yes  Requested medication (s) are on the active medication list:   Yes  Future visit scheduled:   Yes 6/27 for AWV      LOV 06/04/2023 virtually for physical.   No labs ordered.   Last ordered: 09/03/2023 #90, 0 refills  Unable to refill because PSA is due.   Last visit was virtually for CPE.    Requested Prescriptions  Pending Prescriptions Disp Refills   tamsulosin  (FLOMAX ) 0.4 MG CAPS capsule [Pharmacy Med Name: TAMSULOSIN  HCL 0.4 MG CAPSULE] 90 capsule 0    Sig: TAKE 1 CAPSULE BY MOUTH EVERY DAY     Urology: Alpha-Adrenergic Blocker Failed - 12/03/2023  9:06 AM      Failed - PSA in normal range and within 360 days    PSA  Date Value Ref Range Status  10/17/2022 0.69 < OR = 4.00 ng/mL Final    Comment:    The total PSA value from this assay system is  standardized against the WHO standard. The test  result will be approximately 20% lower when compared  to the equimolar-standardized total PSA (Beckman  Coulter). Comparison of serial PSA results should be  interpreted with this fact in mind. . This test was performed using the Siemens  chemiluminescent method. Values obtained from  different assay methods cannot be used interchangeably. PSA levels, regardless of value, should not be interpreted as absolute evidence of the presence or absence of disease.   06/01/2019 0.56 0.10 - 4.00 ng/ml Final    Comment:    Test performed using Access Hybritech PSA Assay, a parmagnetic partical, chemiluminecent immunoassay.         Failed - Valid encounter within last 12 months    Recent Outpatient Visits   None            Passed - Last BP in normal range    BP Readings from Last 1 Encounters:  02/14/23 128/78

## 2023-12-04 ENCOUNTER — Other Ambulatory Visit: Payer: Self-pay | Admitting: Internal Medicine

## 2023-12-04 NOTE — Telephone Encounter (Unsigned)
 Copied from CRM [id number]. Topic: Clinical - Medication Refill >> Dec 04, 2023  8:20 AM Lynnie Saucier S wrote: Medication: traZODone  (DESYREL ) 50 MG tablet  Has the patient contacted their pharmacy? Yes (Agent: If no, request that the patient contact the pharmacy for the refill. If patient does not wish to contact the pharmacy document the reason why and proceed with request.) (Agent: If yes, when and what did the pharmacy advise?)  This is the patient's preferred pharmacy:   [facility] - [place], [place] - [address] [place] [address] Phone: [phone] Fax: [phone]  Is this the correct pharmacy for this prescription? Yes If no, delete pharmacy and type the correct one.   Has the prescription been filled recently? No  Is the patient out of the medication? No  Has the patient been seen for an appointment in the last year OR does the patient have an upcoming appointment? Yes  Can we respond through MyChart? Yes  Agent: Please be advised that Rx refills may take up to 3 business days. We ask that you follow-up with your pharmacy.

## 2023-12-05 MED ORDER — TRAZODONE HCL 50 MG PO TABS: 50.0000 mg | ORAL_TABLET | Freq: Every day | ORAL | 1 refills | Status: DC

## 2023-12-05 NOTE — Telephone Encounter (Signed)
 Change of pharmacy Requested Prescriptions  Pending Prescriptions Disp Refills   traZODone  (DESYREL ) 50 MG tablet 180 tablet 1    Sig: Take 1-2 tablets (50-100 mg total) by mouth at bedtime.     Psychiatry: Antidepressants - Serotonin Modulator Failed - 12/05/2023  1:46 PM      Failed - Valid encounter within last 6 months    Recent Outpatient Visits   None

## 2023-12-08 ENCOUNTER — Other Ambulatory Visit: Payer: Self-pay | Admitting: Internal Medicine

## 2023-12-10 NOTE — Telephone Encounter (Signed)
 Requested medication (s) are due for refill today:   Yes  Requested medication (s) are on the active medication list:   Yes  Future visit scheduled:   No except AWV 01/16/2024.    LOV 06/04/2023 video with [facility].   Last ordered: Remeron  09/26/2023 #180, 0 refills;   Celexa  09/26/2023 #90, 0 refills;   Metformin  09/26/2023 #180, 0 refills  Unable to refill because A1C due, last done 08/21/2022 and 6 month OV due.   From the notes it looks like [facility] does video or home visits for this pt.  Regina to review for refills.      Requested Prescriptions  Pending Prescriptions Disp Refills   mirtazapine  (REMERON ) 45 MG tablet [Pharmacy Med Name: Mirtazapine  Oral Tablet 45 MG] 90 tablet 3    Sig: TAKE 1 TABLET AT BEDTIME     Psychiatry: Antidepressants - mirtazapine  Failed - 12/10/2023  9:10 AM      Failed - Valid encounter within last 6 months    Recent Outpatient Visits   None             citalopram  (CELEXA ) 20 MG tablet [Pharmacy Med Name: Citalopram  Hydrobromide Oral Tablet 20 MG] 90 tablet 3    Sig: TAKE 1 TABLET EVERY DAY     Psychiatry:  Antidepressants - SSRI Failed - 12/10/2023  9:10 AM      Failed - Valid encounter within last 6 months    Recent Outpatient Visits   None             metFORMIN  (GLUCOPHAGE ) 500 MG tablet [Pharmacy Med Name: metFORMIN  HCl Oral Tablet 500 MG] 180 tablet 3    Sig: TAKE 1 TABLET TWICE DAILY WITH MEALS     Endocrinology:  Diabetes - Biguanides Failed - 12/10/2023  9:10 AM      Failed - HBA1C is between 0 and 7.9 and within 180 days    Hgb A1c MFr Bld  Date Value Ref Range Status  08/21/2022 6.5 (H) <5.7 % of total Hgb Final    Comment:    For someone without known diabetes, a hemoglobin A1c value of 6.5% or greater indicates that they may have  diabetes and this should be confirmed with a follow-up  test. . For someone with known diabetes, a value <7% indicates  that their diabetes is well controlled and a value  greater than or equal to 7%  indicates suboptimal  control. A1c targets should be individualized based on  duration of diabetes, age, comorbid conditions, and  other considerations. . Currently, no consensus exists regarding use of hemoglobin A1c for diagnosis of diabetes for children. .          Failed - B12 Level in normal range and within 720 days    Vitamin B-12  Date Value Ref Range Status  12/21/2019 325 180 - 914 pg/mL Final    Comment:    (NOTE) This assay is not validated for testing neonatal or myeloproliferative syndrome specimens for Vitamin B12 levels. Performed at [facility], [address], [place], [place] [address]          Failed - Valid encounter within last 6 months    Recent Outpatient Visits   None            Passed - Cr in normal range and within 360 days    Creat  Date Value Ref Range Status  02/12/2023 1.15 0.70 - 1.28 mg/dL Final   Creatinine, Ser  Date Value  Ref Range Status  02/14/2023 1.10 0.61 - 1.24 mg/dL Final   Creatinine,U  Date Value Ref Range Status  03/20/2017 115.3 mg/dL Final   Creatinine, Urine  Date Value Ref Range Status  09/20/2021 209 20 - 320 mg/dL Final         Passed - eGFR in normal range and within 360 days    GFR calc Af Amer  Date Value Ref Range Status  12/25/2019 >60 >60 mL/min Final   GFR, Estimated  Date Value Ref Range Status  02/14/2023 >60 >60 mL/min Final    Comment:    (NOTE) Calculated using the CKD-EPI Creatinine Equation (2021)    GFR  Date Value Ref Range Status  01/06/2020 59.40 (L) >60.00 mL/min Final   eGFR  Date Value Ref Range Status  02/12/2023 65 > OR = 60 mL/min/1.54m2 Final         Passed - CBC within normal limits and completed in the last 12 months    WBC  Date Value Ref Range Status  02/14/2023 6.1 4.0 - 10.5 K/uL Final   RBC  Date Value Ref Range Status  02/14/2023 3.47 (L) 4.22 - 5.81 MIL/uL Final   Hemoglobin  Date Value Ref Range Status  02/14/2023 11.4 (L) 13.0 - 17.0 g/dL  Final   HCT  Date Value Ref Range Status  02/14/2023 34.0 (L) 39.0 - 52.0 % Final   MCHC  Date Value Ref Range Status  02/14/2023 33.5 30.0 - 36.0 g/dL Final   [facility]  Date Value Ref Range Status  02/14/2023 32.9 26.0 - 34.0 pg Final   MCV  Date Value Ref Range Status  02/14/2023 98.0 80.0 - 100.0 fL Final   No results found for: "PLTCOUNTKUC", "LABPLAT", "POCPLA" RDW  Date Value Ref Range Status  02/14/2023 12.3 11.5 - 15.5 % Final  10/11/2014 13.5 12.3 - 15.4 % Final

## 2023-12-10 NOTE — Telephone Encounter (Signed)
 I will refill his medications but could we set him up for a video visit for a follow-up

## 2023-12-22 ENCOUNTER — Other Ambulatory Visit: Payer: Self-pay | Admitting: Internal Medicine

## 2023-12-23 NOTE — Telephone Encounter (Signed)
 Requested medication (s) are due for refill today: yes  Requested medication (s) are on the active medication list: yes  Last refill:  12/01/23 #30 cap   Future visit scheduled: yes  Notes to clinic:  has appt in 2 days - was given 30 dady refill previously   Requested Prescriptions  Pending Prescriptions Disp Refills   memantine  (NAMENDA  XR) 14 MG CP24 24 hr capsule [Pharmacy Med Name: Memantine  HCl ER Oral Capsule Extended Release 24 Hour 14 MG] 30 capsule 11    Sig: TAKE 1 CAPSULE EVERY DAY     Neurology:  Alzheimer's Agents 2 Failed - 12/23/2023  9:54 AM      Failed - Valid encounter within last 6 months    Recent Outpatient Visits   None            Passed - Cr in normal range and within 360 days    Creat  Date Value Ref Range Status  02/12/2023 1.15 0.70 - 1.28 mg/dL Final   Creatinine, Ser  Date Value Ref Range Status  02/14/2023 1.10 0.61 - 1.24 mg/dL Final   Creatinine,U  Date Value Ref Range Status  03/20/2017 115.3 mg/dL Final   Creatinine, Urine  Date Value Ref Range Status  09/20/2021 209 20 - 320 mg/dL Final         Passed - eGFR is 5 or above and within 360 days    GFR calc Af Amer  Date Value Ref Range Status  12/25/2019 >60 >60 mL/min Final   GFR, Estimated  Date Value Ref Range Status  02/14/2023 >60 >60 mL/min Final    Comment:    (NOTE) Calculated using the CKD-EPI Creatinine Equation (2021)    GFR  Date Value Ref Range Status  01/06/2020 59.40 (L) >60.00 mL/min Final   eGFR  Date Value Ref Range Status  02/12/2023 65 > OR = 60 mL/min/1.65m2 Final

## 2023-12-25 ENCOUNTER — Encounter: Payer: Self-pay | Admitting: Internal Medicine

## 2023-12-25 ENCOUNTER — Other Ambulatory Visit: Payer: Self-pay | Admitting: Internal Medicine

## 2023-12-25 ENCOUNTER — Ambulatory Visit (INDEPENDENT_AMBULATORY_CARE_PROVIDER_SITE_OTHER): Admitting: Internal Medicine

## 2023-12-25 VITALS — BP 138/74 | Ht 72.0 in | Wt 165.4 lb

## 2023-12-25 DIAGNOSIS — R300 Dysuria: Secondary | ICD-10-CM

## 2023-12-25 DIAGNOSIS — Z7984 Long term (current) use of oral hypoglycemic drugs: Secondary | ICD-10-CM

## 2023-12-25 DIAGNOSIS — Z0001 Encounter for general adult medical examination with abnormal findings: Secondary | ICD-10-CM

## 2023-12-25 DIAGNOSIS — Z122 Encounter for screening for malignant neoplasm of respiratory organs: Secondary | ICD-10-CM | POA: Diagnosis not present

## 2023-12-25 DIAGNOSIS — I2510 Atherosclerotic heart disease of native coronary artery without angina pectoris: Secondary | ICD-10-CM | POA: Insufficient documentation

## 2023-12-25 DIAGNOSIS — Z125 Encounter for screening for malignant neoplasm of prostate: Secondary | ICD-10-CM

## 2023-12-25 DIAGNOSIS — E118 Type 2 diabetes mellitus with unspecified complications: Secondary | ICD-10-CM

## 2023-12-25 DIAGNOSIS — H6121 Impacted cerumen, right ear: Secondary | ICD-10-CM | POA: Diagnosis not present

## 2023-12-25 MED ORDER — LORAZEPAM 0.5 MG PO TABS: ORAL_TABLET | ORAL | 0 refills | Status: DC

## 2023-12-25 NOTE — Progress Notes (Addendum)
 Subjective:    Patient ID: Alexander Booth, male    DOB: 1946-01-13, 78 y.o.   MRN: [id number]  HPI  Patient presents to clinic today for his annual exam.    Flu: 04/2023 Tetanus: 08/2012 COVID: Pfizer x 5 Pneumovax: 04/2018 Prevnar 20: 09/2022 Zostavax: 09/2012 Shingrix: Never PSA screening: 09/2022 Colon screening: 09/2019 Vision screening: annually Dentist: as needed  Diet: He does not eat meat. He consumes fruits and veggies. He tries to avoid fried foods. He drinks mostly water . Exercise: None  Review of Systems     Past Medical History:  Diagnosis Date   Arthritis    COPD (chronic obstructive pulmonary disease) (HCC)    Depression    Dyspnea    Erectile dysfunction    He's had a hx of    Hypercholesterolemia    Hypertension    Essential   Lumbosacral disc disease    with chronic low back pain, has implanted nerve stimulator   Peripheral neuropathy    Presenile dementia    Type 2 diabetes mellitus (HCC) 2010   takes oral meds    Current Outpatient Medications  Medication Sig Dispense Refill   Accu-Chek Softclix Lancets lancets Check sugar 2 times daily. DX E11.8 200 each 3   acetaminophen  (TYLENOL ) 500 MG tablet Take 1,000 mg by mouth every 6 (six) hours as needed for moderate pain.     Albuterol  Sulfate (PROAIR  RESPICLICK) 108 (90 Base) MCG/ACT AEPB Inhale 1-2 puffs into the lungs every 4 (four) hours as needed. (Patient taking differently: Inhale 1-2 puffs into the lungs every 4 (four) hours as needed (shortness of breath).) 1 each 11   aspirin  EC 81 MG tablet Take 81 mg by mouth daily with breakfast.     atorvastatin  (LIPITOR) 40 MG tablet TAKE 1 TABLET EVERY DAY 90 tablet 3   Blood Glucose Monitoring Suppl (ACCU-CHEK AVIVA PLUS) w/Device KIT Check sugar 2 times daily. DX E11.8 1 kit 12   BREO ELLIPTA  100-25 MCG/INH AEPB INHALE 1 PUFF BY MOUTH EVERY DAY 60 each 2   carbidopa -levodopa  (SINEMET  IR) 25-100 MG tablet Take 1 tablet by mouth 3 (three) times  daily at 7am, 11am, and 4pm. 270 tablet 1   Cholecalciferol (QC VITAMIN D3) 50 MCG (2000 UT) TABS Take 2,000 Units by mouth daily with lunch.     ciprofloxacin  (CIPRO ) 500 MG tablet Take 1 tablet (500 mg total) by mouth 2 (two) times daily. One po bid x 7 days 14 tablet 0   citalopram  (CELEXA ) 20 MG tablet TAKE 1 TABLET EVERY DAY 90 tablet 0   cyclobenzaprine  (FLEXERIL ) 5 MG tablet Take 5 mg by mouth 2 (two) times daily.     divalproex  (DEPAKOTE  ER) 500 MG 24 hr tablet TAKE 2 TABLETS EVERY DAY 180 tablet 1   donepezil  (ARICEPT ) 10 MG tablet Take 1 tablet (10 mg total) by mouth daily. 90 tablet 3   fluconazole  (DIFLUCAN ) 150 MG tablet Take 1 tablet (150 mg total) by mouth every 3 (three) days. 5 tablet 0   gabapentin  (NEURONTIN ) 400 MG capsule TAKE 3 CAPSULES AT BEDTIME AND MAY REPEAT DOSE ONE TIME IF NEEDED 270 capsule 3   gabapentin  (NEURONTIN ) 400 MG capsule Take 1 capsule (400 mg total) by mouth in the morning AND 2 capsules (800 mg total) at bedtime. 90 capsule 11   gentamicin  cream (GARAMYCIN ) 0.1 % Apply 1 Application topically 2 (two) times daily. 30 g 1   glucose blood (ACCU-CHEK AVIVA PLUS) test strip Check  sugar 2 times daily. DX E11.8 200 each 3   glucose blood (ACCU-CHEK GUIDE) test strip Use as instructed 100 each 12   HYDROcodone -acetaminophen  (NORCO) 7.5-325 MG tablet Take 1 tablet by mouth every 6 (six) hours as needed for pain. 120 tablet 0   lisinopril  (ZESTRIL ) 5 MG tablet TAKE 1 TABLET (5 MG TOTAL) BY MOUTH DAILY. 90 tablet 0   LORazepam  (ATIVAN ) 0.5 MG tablet TAKE 1 TO 2 TABLETS BY MOUTH TWICE A DAY AS NEEDED 45 tablet 0   meloxicam  (MOBIC ) 15 MG tablet Take 1 tablet (15 mg total) by mouth daily with supper. 90 tablet 3   memantine  (NAMENDA  XR) 14 MG CP24 24 hr capsule TAKE 1 CAPSULE EVERY DAY 30 capsule 0   metFORMIN  (GLUCOPHAGE ) 500 MG tablet TAKE 1 TABLET TWICE DAILY WITH MEALS 180 tablet 0   mirtazapine  (REMERON ) 45 MG tablet TAKE 1 TABLET AT BEDTIME 90 tablet 0    nitroGLYCERIN  (NITROSTAT ) 0.4 MG SL tablet Place 0.4 mg under the tongue every 5 (five) minutes x 3 doses as needed for chest pain.     pantoprazole  (PROTONIX ) 20 MG tablet TAKE 1 TABLET BY MOUTH EVERY DAY 90 tablet 1   tamsulosin  (FLOMAX ) 0.4 MG CAPS capsule TAKE 1 CAPSULE BY MOUTH EVERY DAY 90 capsule 0   traZODone  (DESYREL ) 50 MG tablet Take 1-2 tablets (50-100 mg total) by mouth at bedtime. 180 tablet 1   vitamin C (ASCORBIC ACID ) 500 MG tablet Take 500 mg by mouth daily with lunch.     No current facility-administered medications for this visit.    Allergies  Allergen Reactions   Codeine Hives and Itching    Family History  Problem Relation Age of Onset   Multiple myeloma Mother    Diabetes Mother    Diabetes Sister    Lymphoma Sister    Stroke Neg Hx     Social History   Socioeconomic History   Marital status: Married    Spouse name: Stana Ear   Number of children: 3   Years of education: 12   Highest education level: 12th grade  Occupational History   Occupation: retired    Comment: retired  Tobacco Use   Smoking status: Every Day    Current packs/day: 1.00    Average packs/day: 1 pack/day for 40.0 years (40.0 ttl pk-yrs)    Types: Cigarettes   Smokeless tobacco: Never  Vaping Use   Vaping status: Never Used  Substance and Sexual Activity   Alcohol use: No    Alcohol/week: 0.0 standard drinks of alcohol   Drug use: No   Sexual activity: Not on file  Other Topics Concern   Not on file  Social History Narrative   Patient is right handed   Patient resides with wife,consumes 2-3 cups of caffeine daily   Social Drivers of Health   Financial Resource Strain: Low Risk  (12/24/2023)   Overall Financial Resource Strain (CARDIA)    Difficulty of Paying Living Expenses: Not hard at all  Food Insecurity: No Food Insecurity (12/24/2023)   Hunger Vital Sign    Worried About Running Out of Food in the Last Year: Never true    Ran Out of Food in the Last Year: Never true   Transportation Needs: No Transportation Needs (12/24/2023)   PRAPARE - Administrator, Civil Service (Medical): No    Lack of Transportation (Non-Medical): No  Physical Activity: Inactive (12/24/2023)   Exercise Vital Sign    Days of Exercise  per Week: 0 days    Minutes of Exercise per Session: 0 min  Stress: No Stress Concern Present (12/24/2023)   [employer] of Occupational Health - Occupational Stress Questionnaire    Feeling of Stress : Only a little  Social Connections: Moderately Isolated (12/24/2023)   Social Connection and Isolation Panel [NHANES]    Frequency of Communication with Friends and Family: More than three times a week    Frequency of Social Gatherings with Friends and Family: Twice a week    Attends Religious Services: Never    Database administrator or Organizations: No    Attends Banker Meetings: Never    Marital Status: Married  Catering manager Violence: Not At Risk (01/09/2023)   Humiliation, Afraid, Rape, and Kick questionnaire    Fear of Current or Ex-Partner: No    Emotionally Abused: No    Physically Abused: No    Sexually Abused: No     Constitutional: Pt reports fatigue. Denies fever, malaise, headache or abrupt weight changes.  HEENT: Denies eye pain, eye redness, ear pain, ringing in the ears, wax buildup, runny nose, nasal congestion, bloody nose, or sore throat. Respiratory: Patient reports intermittent shortness of breath.  Denies difficulty breathing, cough or sputum production.   Cardiovascular: Denies chest pain, chest tightness, palpitations or swelling in the hands or feet.  Gastrointestinal: Pt reports diarrhea. Denies abdominal pain, bloating, constipation, or blood in the stool.  GU: Patient reports urinary dribbling, burning with urination.  Denies urgency, frequency, pain with urination, blood in urine, odor or discharge. Musculoskeletal: Patient reports chronic joint pain, difficulty with gait, frequent falls.   Denies decrease in range of motion, muscle pain or joint swelling.  Skin: Denies redness, rashes, lesions or ulcercations.  Neurological: Patient reports insomnia, difficulty with balance, tremor, intermittent dizziness and some difficulty with memory.  Denies difficulty with speech or problems with coordination.  Psych: Patient has a history of anxiety and thoughts that he were better off dead but no active SI.  Denies depression, HI.  No other specific complaints in a complete review of systems (except as listed in HPI above).  Objective:   Physical Exam  BP 138/74 (BP Location: Left Arm, Patient Position: Sitting, Cuff Size: Normal)   Ht 6' (1.829 m)   Wt 165 lb 6.4 oz (75 kg)   BMI 22.43 kg/m    Wt Readings from Last 3 Encounters:  02/14/23 175 lb (79.4 kg)  02/12/23 175 lb (79.4 kg)  01/09/23 175 lb (79.4 kg)    General: Appears his stated age, chronically ill-appearing, in NAD. Skin: Warm, dry and intact. No ulcerations noted.  Senile purpura noted. HEENT: Head: normal shape and size; Eyes: sclera white, no icterus, conjunctiva pink, PERRLA and EOMs intact; Ears: Cerumen impaction on the right. Neck:  Neck supple, trachea midline. No masses, lumps or thyromegaly present.  Cardiovascular: Normal rate and rhythm. S1,S2 noted.  No murmur, rubs or gallops noted. No JVD or BLE edema. No carotid bruits noted. Pulmonary/Chest: Normal effort and diminished breath sounds. No respiratory distress. No wheezes, rales or ronchi noted.  Abdomen: Soft and nontender. Normal bowel sounds.  Musculoskeletal: Strength 5/5 BUE, 4/5 BLE.  Shuffling gait with use of rolling walker. Neurological: Alert and oriented. Coordination abnormal due to tremor. Psychiatric: Mood and affect normal. Behavior is normal. Judgment and thought content normal.    BMET    Component Value Date/Time   NA 138 02/14/2023 0727   NA 141 10/11/2014 1122  K 5.0 02/14/2023 0727   CL 99 02/14/2023 0727   CO2 32  02/14/2023 0727   GLUCOSE 109 (H) 02/14/2023 0727   BUN 17 02/14/2023 0727   BUN 11 10/11/2014 1122   CREATININE 1.10 02/14/2023 0727   CREATININE 1.15 02/12/2023 0948   CALCIUM  9.5 02/14/2023 0727   GFRNONAA >60 02/14/2023 0727   GFRAA >60 12/25/2019 0358    Lipid Panel     Component Value Date/Time   CHOL 140 10/17/2022 1012   TRIG 252 (H) 10/17/2022 1012   HDL 36 (L) 10/17/2022 1012   CHOLHDL 3.9 10/17/2022 1012   VLDL 75.6 (H) 04/27/2019 1116   LDLCALC 71 10/17/2022 1012    CBC    Component Value Date/Time   WBC 6.1 02/14/2023 0727   RBC 3.47 (L) 02/14/2023 0727   HGB 11.4 (L) 02/14/2023 0727   HCT 34.0 (L) 02/14/2023 0727   PLT 143 (L) 02/14/2023 0727   MCV 98.0 02/14/2023 0727   MCH 32.9 02/14/2023 0727   MCHC 33.5 02/14/2023 0727   RDW 12.3 02/14/2023 0727   RDW 13.5 10/11/2014 1122   LYMPHSABS 1.5 02/14/2023 0727   LYMPHSABS 1.3 10/11/2014 1122   MONOABS 0.5 02/14/2023 0727   EOSABS 0.1 02/14/2023 0727   EOSABS 0.0 10/11/2014 1122   BASOSABS 0.1 02/14/2023 0727   BASOSABS 0.0 10/11/2014 1122    Hgb A1C Lab Results  Component Value Date   HGBA1C 6.5 (H) 08/21/2022           Assessment & Plan:   Preventative Health Maintenance:  Encouraged him to get a flu shot the fall He declines tetanus for financial reasons, advised him if he gets better To go get this done Pneumovax and Prevnar UTD Zostavax UTD Discussed Shingrix vaccine, he will check coverage with his insurance company and schedule a visit if he would like to have this done Encouraged him to get his COVID booster Colon screening UTD Lung cancer screening CT ordered Encouraged him to consume a balanced diet and exercise regimen Advised him to see an eye doctor and dentist annually We will check c-Met, lipid, A1c, PSA and urine microalbumin  Cerumen impaction, right:  Manual lavage by CMA Recommend Debrox 2 times weekly to prevent further wax buildup  Burning with  urination:  Urinalysis pending  RTC in 6 months, follow-up chronic conditions Helayne Lo, NP

## 2023-12-25 NOTE — Patient Instructions (Signed)
 Health Maintenance, Male  Adopting a healthy lifestyle and getting preventive care are important in promoting health and wellness. Ask your health care provider about:  The right schedule for you to have regular tests and exams.  Things you can do on your own to prevent diseases and keep yourself healthy.  What should I know about diet, weight, and exercise?  Eat a healthy diet    Eat a diet that includes plenty of vegetables, fruits, low-fat dairy products, and lean protein.  Do not eat a lot of foods that are high in solid fats, added sugars, or sodium.  Maintain a healthy weight  Body mass index (BMI) is a measurement that can be used to identify possible weight problems. It estimates body fat based on height and weight. Your health care provider can help determine your BMI and help you achieve or maintain a healthy weight.  Get regular exercise  Get regular exercise. This is one of the most important things you can do for your health. Most adults should:  Exercise for at least 150 minutes each week. The exercise should increase your heart rate and make you sweat (moderate-intensity exercise).  Do strengthening exercises at least twice a week. This is in addition to the moderate-intensity exercise.  Spend less time sitting. Even light physical activity can be beneficial.  Watch cholesterol and blood lipids  Have your blood tested for lipids and cholesterol at 80 years of age, then have this test every 5 years.  You may need to have your cholesterol levels checked more often if:  Your lipid or cholesterol levels are high.  You are older than 80 years of age.  You are at high risk for heart disease.  What should I know about cancer screening?  Many types of cancers can be detected early and may often be prevented. Depending on your health history and family history, you may need to have cancer screening at various ages. This may include screening for:  Colorectal cancer.  Prostate cancer.  Skin cancer.  Lung  cancer.  What should I know about heart disease, diabetes, and high blood pressure?  Blood pressure and heart disease  High blood pressure causes heart disease and increases the risk of stroke. This is more likely to develop in people who have high blood pressure readings or are overweight.  Talk with your health care provider about your target blood pressure readings.  Have your blood pressure checked:  Every 3-5 years if you are 9-95 years of age.  Every year if you are 85 years old or older.  If you are between the ages of 29 and 29 and are a current or former smoker, ask your health care provider if you should have a one-time screening for abdominal aortic aneurysm (AAA).  Diabetes  Have regular diabetes screenings. This checks your fasting blood sugar level. Have the screening done:  Once every three years after age 23 if you are at a normal weight and have a low risk for diabetes.  More often and at a younger age if you are overweight or have a high risk for diabetes.  What should I know about preventing infection?  Hepatitis B  If you have a higher risk for hepatitis B, you should be screened for this virus. Talk with your health care provider to find out if you are at risk for hepatitis B infection.  Hepatitis C  Blood testing is recommended for:  Everyone born from [birth year] through 1967.  Anyone  with known risk factors for hepatitis C.  Sexually transmitted infections (STIs)  You should be screened each year for STIs, including gonorrhea and chlamydia, if:  You are sexually active and are younger than 80 years of age.  You are older than 80 years of age and your health care provider tells you that you are at risk for this type of infection.  Your sexual activity has changed since you were last screened, and you are at increased risk for chlamydia or gonorrhea. Ask your health care provider if you are at risk.  Ask your health care provider about whether you are at high risk for HIV. Your health care provider  may recommend a prescription medicine to help prevent HIV infection. If you choose to take medicine to prevent HIV, you should first get tested for HIV. You should then be tested every 3 months for as long as you are taking the medicine.  Follow these instructions at home:  Alcohol use  Do not drink alcohol if your health care provider tells you not to drink.  If you drink alcohol:  Limit how much you have to 0-2 drinks a day.  Know how much alcohol is in your drink. In the [place] one drink equals one 12 oz bottle of beer (355 mL), one 5 oz glass of wine (148 mL), or one 1 oz glass of hard liquor (44 mL).  Lifestyle  Do not use any products that contain nicotine or tobacco. These products include cigarettes, chewing tobacco, and vaping devices, such as e-cigarettes. If you need help quitting, ask your health care provider.  Do not use street drugs.  Do not share needles.  Ask your health care provider for help if you need support or information about quitting drugs.  General instructions  Schedule regular health, dental, and eye exams.  Stay current with your vaccines.  Tell your health care provider if:  You often feel depressed.  You have ever been abused or do not feel safe at home.  Summary  Adopting a healthy lifestyle and getting preventive care are important in promoting health and wellness.  Follow your health care provider's instructions about healthy diet, exercising, and getting tested or screened for diseases.  Follow your health care provider's instructions on monitoring your cholesterol and blood pressure.  This information is not intended to replace advice given to you by your health care provider. Make sure you discuss any questions you have with your health care provider.  Document Revised: 11/27/2020 Document Reviewed: 11/27/2020  Elsevier Patient Education  2024 [employer].

## 2023-12-26 ENCOUNTER — Ambulatory Visit: Payer: Self-pay | Admitting: Internal Medicine

## 2023-12-26 LAB — CBC
HCT: 40.4 % (ref 38.5–50.0)
Hemoglobin: 13.1 g/dL — ABNORMAL LOW (ref 13.2–17.1)
MCH: 32.4 pg (ref 27.0–33.0)
MCHC: 32.4 g/dL (ref 32.0–36.0)
MCV: 100 fL (ref 80.0–100.0)
MPV: 11.5 fL (ref 7.5–12.5)
Platelets: 150 10*3/uL (ref 140–400)
RBC: 4.04 10*6/uL — ABNORMAL LOW (ref 4.20–5.80)
RDW: 11.6 % (ref 11.0–15.0)
WBC: 7.4 10*3/uL (ref 3.8–10.8)

## 2023-12-26 LAB — HEMOGLOBIN A1C
Hgb A1c MFr Bld: 6 % — ABNORMAL HIGH (ref <5.7)
Mean Plasma Glucose: 126 mg/dL
eAG (mmol/L): 7 mmol/L

## 2023-12-26 LAB — COMPREHENSIVE METABOLIC PANEL WITH GFR
AG Ratio: 1.8 (calc) (ref 1.0–2.5)
ALT: 7 U/L — ABNORMAL LOW (ref 9–46)
AST: 12 U/L (ref 10–35)
Albumin: 4.1 g/dL (ref 3.6–5.1)
Alkaline phosphatase (APISO): 55 U/L (ref 35–144)
BUN: 18 mg/dL (ref 7–25)
CO2: 29 mmol/L (ref 20–32)
Calcium: 9.4 mg/dL (ref 8.6–10.3)
Chloride: 102 mmol/L (ref 98–110)
Creat: 1.13 mg/dL (ref 0.70–1.22)
Globulin: 2.3 g/dL (ref 1.9–3.7)
Glucose, Bld: 172 mg/dL — ABNORMAL HIGH (ref 65–99)
Potassium: 4.6 mmol/L (ref 3.5–5.3)
Sodium: 139 mmol/L (ref 135–146)
Total Bilirubin: 0.4 mg/dL (ref 0.2–1.2)
Total Protein: 6.4 g/dL (ref 6.1–8.1)
eGFR: 66 mL/min/{1.73_m2} (ref >=60)

## 2023-12-26 LAB — LIPID PANEL
Cholesterol: 104 mg/dL (ref <200)
HDL: 41 mg/dL (ref >=40)
LDL Cholesterol (Calc): 43 mg/dL
Non-HDL Cholesterol (Calc): 63 mg/dL (ref <130)
Total CHOL/HDL Ratio: 2.5 (calc) (ref <5.0)
Triglycerides: 112 mg/dL (ref <150)

## 2023-12-26 LAB — PSA: PSA: 0.85 ng/mL (ref <=4.00)

## 2023-12-26 NOTE — Telephone Encounter (Signed)
 Requested Prescriptions  Pending Prescriptions Disp Refills   metFORMIN  (GLUCOPHAGE ) 500 MG tablet [Pharmacy Med Name: metFORMIN  HCl Oral Tablet 500 MG] 180 tablet 3    Sig: TAKE 1 TABLET TWICE DAILY WITH MEALS     Endocrinology:  Diabetes - Biguanides Failed - 12/26/2023 12:57 PM      Failed - B12 Level in normal range and within 720 days    Vitamin B-12  Date Value Ref Range Status  12/21/2019 325 180 - 914 pg/mL Final    Comment:    (NOTE) This assay is not validated for testing neonatal or myeloproliferative syndrome specimens for Vitamin B12 levels. Performed at [facility], [address] [place], [place] [address]          Failed - Valid encounter within last 6 months    Recent Outpatient Visits           Yesterday Encounter for general adult medical examination with abnormal findings   [place] [facility] [place], [place], NP              Passed - Cr in normal range and within 360 days    Creat  Date Value Ref Range Status  12/25/2023 1.13 0.70 - 1.22 mg/dL Final   Creatinine,U  Date Value Ref Range Status  03/20/2017 115.3 mg/dL Final   Creatinine, Urine  Date Value Ref Range Status  09/20/2021 209 20 - 320 mg/dL Final         Passed - HBA1C is between 0 and 7.9 and within 180 days    Hgb A1c MFr Bld  Date Value Ref Range Status  12/25/2023 6.0 (H) <5.7 % Final    Comment:    For someone without known diabetes, a hemoglobin  A1c value between 5.7% and 6.4% is consistent with prediabetes and should be confirmed with a  follow-up test. . For someone with known diabetes, a value <7% indicates that their diabetes is well controlled. A1c targets should be individualized based on duration of diabetes, age, comorbid conditions, and other considerations. . This assay result is consistent with an increased risk of diabetes. . Currently, no consensus exists regarding use of hemoglobin A1c for diagnosis of diabetes for  children. .          Passed - eGFR in normal range and within 360 days    GFR calc Af Amer  Date Value Ref Range Status  12/25/2019 >60 >60 mL/min Final   GFR, Estimated  Date Value Ref Range Status  02/14/2023 >60 >60 mL/min Final    Comment:    (NOTE) Calculated using the CKD-EPI Creatinine Equation (2021)    GFR  Date Value Ref Range Status  01/06/2020 59.40 (L) >60.00 mL/min Final   eGFR  Date Value Ref Range Status  12/25/2023 66 > OR = 60 mL/min/1.20m2 Final         Passed - CBC within normal limits and completed in the last 12 months    WBC  Date Value Ref Range Status  12/25/2023 7.4 3.8 - 10.8 Thousand/uL Final   RBC  Date Value Ref Range Status  12/25/2023 4.04 (L) 4.20 - 5.80 Million/uL Final   Hemoglobin  Date Value Ref Range Status  12/25/2023 13.1 (L) 13.2 - 17.1 g/dL Final   HCT  Date Value Ref Range Status  12/25/2023 40.4 38.5 - 50.0 % Final   MCHC  Date Value Ref Range Status  12/25/2023 32.4 32.0 - 36.0 g/dL Final  Comment:    For adults, a slight decrease in the calculated MCHC value (in the range of 30 to 32 g/dL) is most likely not clinically significant; however, it should be interpreted with caution in correlation with other red cell parameters and the patient's clinical condition.    [facility]  Date Value Ref Range Status  12/25/2023 32.4 27.0 - 33.0 pg Final   MCV  Date Value Ref Range Status  12/25/2023 100.0 80.0 - 100.0 fL Final   No results found for: "PLTCOUNTKUC", "LABPLAT", "POCPLA" RDW  Date Value Ref Range Status  12/25/2023 11.6 11.0 - 15.0 % Final  10/11/2014 13.5 12.3 - 15.4 % Final

## 2024-02-06 ENCOUNTER — Other Ambulatory Visit: Payer: Self-pay | Admitting: Internal Medicine

## 2024-02-09 ENCOUNTER — Encounter: Payer: Self-pay | Admitting: Internal Medicine

## 2024-02-09 NOTE — Telephone Encounter (Signed)
 Requested medication (s) are due for refill today: yes  Requested medication (s) are on the active medication list: yes  Last refill:  12/25/23  Future visit scheduled: yes  Notes to clinic:  Unable to refill per protocol, cannot delegate.      Requested Prescriptions  Pending Prescriptions Disp Refills   LORazepam  (ATIVAN ) 0.5 MG tablet [Pharmacy Med Name: LORAZEPAM  0.5 MG TABLET] 60 tablet 0    Sig: TAKE 1 TO 2 TABLETS BY MOUTH TWICE A DAY AS NEEDED. *NOT COVERED.*     Not Delegated - Psychiatry: Anxiolytics/Hypnotics 2 Failed - 02/09/2024 10:06 AM      Failed - This refill cannot be delegated      Failed - Urine Drug Screen completed in last 360 days      Passed - Patient is not pregnant      Passed - Valid encounter within last 6 months    Recent Outpatient Visits           1 month ago Encounter for general adult medical examination with abnormal findings   [place] [facility] [place], Angeline ORN, NP

## 2024-02-10 ENCOUNTER — Encounter: Payer: Self-pay | Admitting: Emergency Medicine

## 2024-02-10 MED ORDER — FLUCONAZOLE 150 MG PO TABS
ORAL_TABLET | ORAL | 0 refills | Status: DC
Start: 2024-02-10 — End: 2024-04-14

## 2024-02-25 ENCOUNTER — Encounter: Payer: Self-pay | Admitting: Internal Medicine

## 2024-02-25 ENCOUNTER — Other Ambulatory Visit: Admitting: Internal Medicine

## 2024-02-25 VITALS — HR 68 | Wt 160.0 lb

## 2024-02-25 DIAGNOSIS — J449 Chronic obstructive pulmonary disease, unspecified: Secondary | ICD-10-CM

## 2024-02-25 DIAGNOSIS — I700 Atherosclerosis of aorta: Secondary | ICD-10-CM

## 2024-02-25 DIAGNOSIS — K219 Gastro-esophageal reflux disease without esophagitis: Secondary | ICD-10-CM

## 2024-02-25 DIAGNOSIS — I25118 Atherosclerotic heart disease of native coronary artery with other forms of angina pectoris: Secondary | ICD-10-CM

## 2024-02-25 DIAGNOSIS — I10 Essential (primary) hypertension: Secondary | ICD-10-CM | POA: Diagnosis not present

## 2024-02-25 DIAGNOSIS — E118 Type 2 diabetes mellitus with unspecified complications: Secondary | ICD-10-CM | POA: Diagnosis not present

## 2024-02-25 DIAGNOSIS — F03918 Unspecified dementia, unspecified severity, with other behavioral disturbance: Secondary | ICD-10-CM | POA: Diagnosis not present

## 2024-02-25 DIAGNOSIS — G8929 Other chronic pain: Secondary | ICD-10-CM

## 2024-02-25 DIAGNOSIS — G479 Sleep disorder, unspecified: Secondary | ICD-10-CM

## 2024-02-25 DIAGNOSIS — G20B2 Parkinson's disease with dyskinesia, with fluctuations: Secondary | ICD-10-CM | POA: Diagnosis not present

## 2024-02-25 DIAGNOSIS — E781 Pure hyperglyceridemia: Secondary | ICD-10-CM | POA: Diagnosis not present

## 2024-02-25 DIAGNOSIS — E1142 Type 2 diabetes mellitus with diabetic polyneuropathy: Secondary | ICD-10-CM

## 2024-02-25 DIAGNOSIS — N401 Enlarged prostate with lower urinary tract symptoms: Secondary | ICD-10-CM

## 2024-02-25 NOTE — Patient Instructions (Signed)
 Parkinson's Disease Parkinson's disease causes problems with movement. It makes it harder for you to walk or control your body. Each person with the disease is affected differently. Treatments can help you manage your symptoms. Parkinson's disease can range from mild to very bad, but it gets worse over time. This often happens slowly over many years. What are the causes? Parkinson's disease is caused by a loss of brain cells called neurons. These brain cells make a substance called dopamine, which is needed to control body movement. It's not known what causes the brain cells to die. What increases the risk? Being male. Being 32 years of age or older. Having a family history of Parkinson's disease. Having an injury to the brain in the past. Being around things that are harmful or poisonous, such as pesticides. Having depression. This is when you feel sad or hopeless. What are the signs or symptoms? Symptoms can vary and get worse over time. The main symptoms can be seen in your movement. These include: Shaking or tremors that you can't control. This happens while you're resting. Stiffness in your arms and legs. Losing facial expressions. Walking in a way that isn't normal. You may walk with short, shuffling steps. Loss of balance when standing. You may sway, fall backward, or have trouble making turns. Other symptoms include: Being very sad, worried, or nervous. Having delusions. These are strong beliefs that aren't true. Having hallucinations. This is when you see, hear, taste, smell, or feel things that aren't real. Trouble speaking or swallowing. Trouble pooping (constipation). Needing to pee (urinate) right away, peeing often, or losing control of when you pee or poop. Sleep problems. How is this diagnosed? A diagnosis may be made based on symptoms, your medical history, and a physical exam. You may also have imaging tests that make pictures of your brain. How is this treated? There  is no cure for Parkinson's disease. The goal of treatment is to manage your symptoms. It may include: Medicines. Therapy to help with talking or movement. Surgery to reduce shaking and other movements that you can't control. Follow these instructions at home: Medicines Take your medicines only as told by your health care provider. Avoid taking pain or sleeping medicines. These can affect your thinking. Activity Ask your provider if it's safe for you to drive. Exercise as told by your provider or physical therapist. Lifestyle  Put grab bars and railings in your home, especially near the toilet and in the shower. These help prevent falls. Do not smoke, vape, or use products with nicotine or tobacco in them. If you need help quitting, talk with your provider. Do not drink alcohol. General instructions Talk with your provider to find out what kind of help you need at home. Ask about ways to stay safe. Join a support group for people with Parkinson's disease. Where to find more information [employer] Stroke: [employer] Parkinson's Foundation: parkinson.org Contact a health care provider if: Medicines don't help your symptoms. You have a lot of side effects from your medicines. You keep losing your balance or you fall. You need more help at home. You have trouble swallowing. You have a very hard time pooping. You feel very sad, worried, or confused. You see, hear, taste, smell, or feel things that aren't real. Get help right away if: You were hurt in a fall. You can't swallow without choking. You have chest pain or trouble breathing. You don't feel safe at home. These symptoms may be an emergency.  Call 911 right away. Do not wait to see if the symptoms will go away. Do not drive yourself to the hospital. Also, get help right away if: You feel like you may hurt yourself or others. You have thoughts about taking your own life. Take one of  these steps: Go to your nearest emergency room. Call 911. Call the National Suicide Prevention Lifeline at 919-766-2411 or 988. Text the Crisis Text Line at 4324995006. This information is not intended to replace advice given to you by your health care provider. Make sure you discuss any questions you have with your health care provider. Document Revised: 04/10/2023 Document Reviewed: 09/23/2022 Elsevier Patient Education  2024 [employer].

## 2024-02-25 NOTE — Progress Notes (Signed)
 Subjective:    Patient ID: Alexander Booth, male    DOB: 1945-12-27, 78 y.o.   MRN: [id number]  HPI  Discussed the use of AI scribe software for clinical note transcription with the patient, who gave verbal consent to proceed.  Maico Mulvehill Mizell Ioane Bhola is an 78 year old male with Parkinson's disease who presents with decreased mobility and weakness. He is accompanied by his daughter, who is his primary caregiver.  He has experienced decreased mobility and weakness for the past five days. His daughter notes that he was unable to walk at all on Saturday and has been using a transport chair frequently around the house. He has not recovered as he usually does after similar episodes.  He has a history of Parkinson's disease with significant tremors and stiffness. He is taking sinemet  25-100 mg three times a day but no longer follows with neurology. His daughter describes his tremors as severe, with leg shaking when he attempts to walk, leading to freezing and inability to move. He also experiences episodes of zoning out, which his daughter attributes to Parkinson's.  He has chronic obstructive pulmonary disease (COPD) and has been prescribed breo and albuterol , along with oxygen  therapy. However, he is not consistently using his inhalers and wears oxygen  for about three-fourths of the day and all night, up to 4 liters. He continues to smoke, which may exacerbate his symptoms. He reports regular phlegm production with his cough.  He has dementia with behavioral disturbance and is taking depakote , aricept , and memantine  as prescribed. His daughter notes that he is not hallucinating and is aware of his surroundings, but sometimes zones out. He does not follow with neurology.  He has a history of anxiety managed on citalopram  and lorazepam .  He does feel anxious but not overtly depressed.  He has difficulty sleeping despite mirtazapine  and trazodone .  He has tried taking the trazodone  only  as needed however often knows that he does not sleep well on those nights.  He has type 2 diabetes and takes metformin  twice a day. His last A1c was 6% in June 2025. He does not check his blood sugars or feet routinely and has a history of peripheral neuropathy. His last eye exam was over a year ago.  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 He has had a nerve ablation in the past and is taking cyclobenzaprine  and gabapentin  as prescribed. He follows with pain management but has recently stopped taking his pain medication, including hydrocodone  and meloxicam , about two months ago without withdrawal symptoms.  He has hyperlipidemia with aortic atherosclerosis and is taking atorvastatin  and aspirin . His last LDL was 43 and triglycerides were 112 in June 2025. No myalgias are reported.  He has benign prostatic hyperplasia (BPH) and reports mainly urinary frequency. He is taking tamsulosin  as prescribed and does not follow with urology. He recently experienced strong-smelling urine and was treated for a yeast infection with fluconazole .  They did an at home urine test which was negative.  He has gastroesophageal reflux disease (GERD) and is taking pepcid  AC as needed, as he did not find pantoprazole  effective. He experiences occasional chest pain, which he attributes to heartburn, and takes extra pepcid  during these episodes.  His appetite is poor, and he has lost about 5-6 pounds in the last two months, currently weighing 160 pounds. He finds that 'nothing tastes good' and is not hungry. His daughter is trying to ensure adequate fluid intake with pedialyte and  electrolytes.       Review of Systems     Past Medical History:  Diagnosis Date   Arthritis    COPD (chronic obstructive pulmonary disease) (HCC)    Depression    Dyspnea    Erectile dysfunction    He's had a hx of    Hypercholesterolemia    Hypertension    Essential   Lumbosacral disc disease    with chronic  low back pain, has implanted nerve stimulator   Peripheral neuropathy    Presenile dementia    Type 2 diabetes mellitus (HCC) 2010   takes oral meds    Current Outpatient Medications  Medication Sig Dispense Refill   Accu-Chek Softclix Lancets lancets Check sugar 2 times daily. DX E11.8 200 each 3   acetaminophen  (TYLENOL ) 500 MG tablet Take 1,000 mg by mouth every 6 (six) hours as needed for moderate pain.     Albuterol  Sulfate (PROAIR  RESPICLICK) 108 (90 Base) MCG/ACT AEPB Inhale 1-2 puffs into the lungs every 4 (four) hours as needed. (Patient taking differently: Inhale 1-2 puffs into the lungs every 4 (four) hours as needed (shortness of breath).) 1 each 11   aspirin  EC 81 MG tablet Take 81 mg by mouth daily with breakfast.     atorvastatin  (LIPITOR) 40 MG tablet TAKE 1 TABLET EVERY DAY 90 tablet 3   Blood Glucose Monitoring Suppl (ACCU-CHEK AVIVA PLUS) w/Device KIT Check sugar 2 times daily. DX E11.8 1 kit 12   BREO ELLIPTA  100-25 MCG/INH AEPB INHALE 1 PUFF BY MOUTH EVERY DAY 60 each 2   carbidopa -levodopa  (SINEMET  IR) 25-100 MG tablet Take 1 tablet by mouth 3 (three) times daily at 7am, 11am, and 4pm. 270 tablet 1   Cholecalciferol (QC VITAMIN D3) 50 MCG (2000 UT) TABS Take 2,000 Units by mouth daily with lunch.     citalopram  (CELEXA ) 20 MG tablet TAKE 1 TABLET EVERY DAY 90 tablet 0   cyclobenzaprine  (FLEXERIL ) 5 MG tablet Take 5 mg by mouth 2 (two) times daily.     divalproex  (DEPAKOTE  ER) 500 MG 24 hr tablet TAKE 2 TABLETS EVERY DAY 180 tablet 1   donepezil  (ARICEPT ) 10 MG tablet Take 1 tablet (10 mg total) by mouth daily. 90 tablet 3   fluconazole  (DIFLUCAN ) 150 MG tablet 1 tablet p.o. every 3 days for fungal infection 3 tablet 0   gabapentin  (NEURONTIN ) 400 MG capsule TAKE 3 CAPSULES AT BEDTIME AND MAY REPEAT DOSE ONE TIME IF NEEDED (Patient taking differently: TAKE 2 CAPSULES AT BEDTIME AND MAY REPEAT DOSE ONE TIME IF NEEDED) 270 capsule 3   gentamicin  cream (GARAMYCIN ) 0.1 %  Apply 1 Application topically 2 (two) times daily. 30 g 1   glucose blood (ACCU-CHEK AVIVA PLUS) test strip Check sugar 2 times daily. DX E11.8 200 each 3   glucose blood (ACCU-CHEK GUIDE) test strip Use as instructed 100 each 12   HYDROcodone -acetaminophen  (NORCO) 7.5-325 MG tablet Take 1 tablet by mouth every 6 (six) hours as needed for pain. 120 tablet 0   lisinopril  (ZESTRIL ) 5 MG tablet TAKE 1 TABLET (5 MG TOTAL) BY MOUTH DAILY. 90 tablet 0   LORazepam  (ATIVAN ) 0.5 MG tablet TAKE 1 TO 2 TABLETS BY MOUTH TWICE A DAY AS NEEDED. *NOT COVERED.* 60 tablet 0   meloxicam  (MOBIC ) 15 MG tablet Take 1 tablet (15 mg total) by mouth daily with supper. 90 tablet 3   memantine  (NAMENDA  XR) 14 MG CP24 24 hr capsule TAKE 1 CAPSULE EVERY DAY 30  capsule 0   metFORMIN  (GLUCOPHAGE ) 500 MG tablet TAKE 1 TABLET TWICE DAILY WITH MEALS 180 tablet 3   mirtazapine  (REMERON ) 45 MG tablet TAKE 1 TABLET AT BEDTIME 90 tablet 0   nitroGLYCERIN  (NITROSTAT ) 0.4 MG SL tablet Place 0.4 mg under the tongue every 5 (five) minutes x 3 doses as needed for chest pain.     pantoprazole  (PROTONIX ) 20 MG tablet TAKE 1 TABLET BY MOUTH EVERY DAY 90 tablet 1   tamsulosin  (FLOMAX ) 0.4 MG CAPS capsule TAKE 1 CAPSULE BY MOUTH EVERY DAY 90 capsule 0   traZODone  (DESYREL ) 50 MG tablet Take 1-2 tablets (50-100 mg total) by mouth at bedtime. 180 tablet 1   vitamin C (ASCORBIC ACID ) 500 MG tablet Take 500 mg by mouth daily with lunch.     No current facility-administered medications for this visit.    Allergies  Allergen Reactions   Codeine Hives and Itching    Family History  Problem Relation Age of Onset   Multiple myeloma Mother    Diabetes Mother    Diabetes Sister    Lymphoma Sister    Stroke Neg Hx     Social History   Socioeconomic History   Marital status: Married    Spouse name: Rock   Number of children: 3   Years of education: 12   Highest education level: 12th grade  Occupational History   Occupation: retired     Comment: retired  Tobacco Use   Smoking status: Every Day    Current packs/day: 1.00    Average packs/day: 1 pack/day for 40.0 years (40.0 ttl pk-yrs)    Types: Cigarettes   Smokeless tobacco: Never  Vaping Use   Vaping status: Never Used  Substance and Sexual Activity   Alcohol use: No    Alcohol/week: 0.0 standard drinks of alcohol   Drug use: No   Sexual activity: Not on file  Other Topics Concern   Not on file  Social History Narrative   Patient is right handed   Patient resides with wife,consumes 2-3 cups of caffeine daily   Social Drivers of Health   Financial Resource Strain: Low Risk  (12/24/2023)   Overall Financial Resource Strain (CARDIA)    Difficulty of Paying Living Expenses: Not hard at all  Food Insecurity: No Food Insecurity (12/24/2023)   Hunger Vital Sign    Worried About Running Out of Food in the Last Year: Never true    Ran Out of Food in the Last Year: Never true  Transportation Needs: No Transportation Needs (12/24/2023)   PRAPARE - Administrator, Civil Service (Medical): No    Lack of Transportation (Non-Medical): No  Physical Activity: Inactive (12/24/2023)   Exercise Vital Sign    Days of Exercise per Week: 0 days    Minutes of Exercise per Session: 0 min  Stress: No Stress Concern Present (12/24/2023)   [employer] of Occupational Health - Occupational Stress Questionnaire    Feeling of Stress : Only a little  Social Connections: Moderately Isolated (12/24/2023)   Social Connection and Isolation Panel    Frequency of Communication with Friends and Family: More than three times a week    Frequency of Social Gatherings with Friends and Family: Twice a week    Attends Religious Services: Never    Database administrator or Organizations: No    Attends Banker Meetings: Never    Marital Status: Married  Catering manager Violence: Not At Risk (01/09/2023)  Humiliation, Afraid, Rape, and Kick questionnaire    Fear of  Current or Ex-Partner: No    Emotionally Abused: No    Physically Abused: No    Sexually Abused: No     Constitutional: Patient reports fatigue.  Denies fever, malaise, headache or abrupt weight changes.  HEENT: Denies eye pain, eye redness, ear pain, ringing in the ears, wax buildup, runny nose, nasal congestion, bloody nose, or sore throat. Respiratory: Patient reports chronic cough and shortness of breath.  Denies difficulty breathing.   Cardiovascular: Patient reports episode of chest pain 4 days ago.  Denies chest tightness, palpitations or swelling in the hands or feet.  Gastrointestinal: Patient reports intermittent reflux.  Denies abdominal pain, bloating, constipation, diarrhea or blood in the stool.  GU: Patient reports urinary frequency.  Denies urgency, pain with urination, burning sensation, blood in urine, odor or discharge. Musculoskeletal: Patient reports chronic back pain, difficulty with gait.  Denies decrease in range of motion, or joint swelling.  Skin: Denies redness, rashes, lesions or ulcercations.  Neurological: Patient reports difficulty with memory and balance.  Denies dizziness, difficulty with speech or problems with coordination.  Psych: Patient has a history of anxiety.  Denies depression, SI/HI.  No other specific complaints in a complete review of systems (except as listed in HPI above).  Objective:   Physical Exam  Pulse 68   Wt 160 lb (72.6 kg)   SpO2 94%   BMI 21.70 kg/m    Wt Readings from Last 3 Encounters:  12/25/23 165 lb 6.4 oz (75 kg)  02/14/23 175 lb (79.4 kg)  02/12/23 175 lb (79.4 kg)    General: Appears his stated age, chronically ill appearing, in NAD. Skin: Warm, dry and intact.  Ulcer noted of second toe on right foot HEENT: Head: normal shape and size; Eyes: sclera white, no icterus, conjunctiva pink, PERRLA and EOMs intact;  Cardiovascular: Normal rate and rhythm with occasional ectopic beat. No murmur, rubs or gallops noted. No  JVD or BLE edema. No carotid bruits noted. Pulmonary/Chest: Normal effort and diminished vesicular breath sounds. No respiratory distress. No wheezes, rales or ronchi noted.  Abdomen: Normal bowel sounds.  Musculoskeletal: Chair bound at this time.  Strength 4/5 BUE/BLE.  Handgrips weak but equal. Neurological: Alert and oriented. Cranial nerves II-XII grossly intact. Coordination normal but slowed.  Resting tremor noted mainly in hands, severe in legs when standing. Psychiatric: Mood and affect normal. Behavior is normal. Judgment and thought content normal.    BMET    Component Value Date/Time   NA 139 12/25/2023 1000   NA 141 10/11/2014 1122   K 4.6 12/25/2023 1000   CL 102 12/25/2023 1000   CO2 29 12/25/2023 1000   GLUCOSE 172 (H) 12/25/2023 1000   BUN 18 12/25/2023 1000   BUN 11 10/11/2014 1122   CREATININE 1.13 12/25/2023 1000   CALCIUM  9.4 12/25/2023 1000   GFRNONAA >60 02/14/2023 0727   GFRAA >60 12/25/2019 0358    Lipid Panel     Component Value Date/Time   CHOL 104 12/25/2023 1000   TRIG 112 12/25/2023 1000   HDL 41 12/25/2023 1000   CHOLHDL 2.5 12/25/2023 1000   VLDL 75.6 (H) 04/27/2019 1116   LDLCALC 43 12/25/2023 1000    CBC    Component Value Date/Time   WBC 7.4 12/25/2023 1000   RBC 4.04 (L) 12/25/2023 1000   HGB 13.1 (L) 12/25/2023 1000   HCT 40.4 12/25/2023 1000   PLT 150 12/25/2023 1000  MCV 100.0 12/25/2023 1000   MCH 32.4 12/25/2023 1000   MCHC 32.4 12/25/2023 1000   RDW 11.6 12/25/2023 1000   RDW 13.5 10/11/2014 1122   LYMPHSABS 1.5 02/14/2023 0727   LYMPHSABS 1.3 10/11/2014 1122   MONOABS 0.5 02/14/2023 0727   EOSABS 0.1 02/14/2023 0727   EOSABS 0.0 10/11/2014 1122   BASOSABS 0.1 02/14/2023 0727   BASOSABS 0.0 10/11/2014 1122    Hgb A1C Lab Results  Component Value Date   HGBA1C 6.0 (H) 12/25/2023            Assessment & Plan:    Assessment and Plan    Decreased mobility and functional decline Decreased mobility and  functional decline likely related to Parkinson's disease. No stroke or acute neurological event. Discussed future home assistance and equipment needs. - Adjust Sinemet  dosage to 1.5 tabs TID, then to 2 tabs in 3-4 days if no improvement. -He declines in-home physical therapy at this time as previous physical therapy has not been effective -Referral to palliative care to discuss home assistance and equipment with family.  Unintentional weight loss Unintentional weight loss with poor appetite and difficulty tasting food. - Encourage increased fluid and nutritional intake.  Chronic obstructive pulmonary disease (COPD) with chronic cough and dyspnea Chronic cough and dyspnea with inconsistent oxygen  and inhaler use. Continues smoking, exacerbating symptoms. - Encourage consistent use of inhalers and oxygen . - Advise not to exceed 3 liters of oxygen .  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managed with depakote , aricept ,, lorazepam  and citalopram . Medication regimen may need adjustment. - Decrease Depakote  to 250 mg twice daily - Evaluate medication regimen for adjustments.  Type 2 diabetes mellitus with peripheral neuropathy Type 2 diabetes with peripheral neuropathy. Last A1c was 6%. Not routinely checking blood sugars or feet. Last eye exam over a year ago. - Encourage regular blood sugar monitoring.  Chronic back pain with radicular symptoms Chronic back pain with radicular symptoms. Pain decreased since stopping pain medication.  Aortic atherosclerosis and hyperlipidemia Aortic atherosclerosis and hyperlipidemia managed with atorvastatin  and aspirin . Last LDL was 43 and triglycerides were 112. No myalgias reported.  Parkinson's disease with tremors and stiffness Parkinson's disease with significant tremors, stiffness, and freezing episodes. Current Sinemet  dosage may be insufficient. - Increase Sinemet  dosageto 1.5 tabs TID, then to 2 tabs in 3-4 days  if no improvement..  Benign prostatic hyperplasia with urinary frequency Benign prostatic hyperplasia with urinary frequency managed with tamsulosin . - Continue tamsulosin . - Monitor urinary symptoms.  Gastroesophageal reflux disease (GERD) GERD with recent chest pain possibly related to reflux. Prefers Pepcid  over pantoprazole . - Continue Pepcid  as needed.  Insomnia Insomnia managed with mirtazapine  and trazodone . Concerns about sodium levels with current medication combination, though recent levels were normal. - Consider discontinuing trazodone  although we will hold off at this time as he reports he cannot sleep without it. - Monitor sleep patterns and adjust medications as needed.         Angeline Laura, NP

## 2024-02-26 ENCOUNTER — Other Ambulatory Visit: Payer: Self-pay | Admitting: Internal Medicine

## 2024-03-01 NOTE — Telephone Encounter (Signed)
 Requested Prescriptions  Pending Prescriptions Disp Refills   lisinopril  (ZESTRIL ) 5 MG tablet [Pharmacy Med Name: LISINOPRIL  5 MG TABLET] 90 tablet 0    Sig: TAKE 1 TABLET (5 MG TOTAL) BY MOUTH DAILY.     Cardiovascular:  ACE Inhibitors Passed - 03/01/2024 11:21 AM      Passed - Cr in normal range and within 180 days    Creat  Date Value Ref Range Status  12/25/2023 1.13 0.70 - 1.22 mg/dL Final   Creatinine, Urine  Date Value Ref Range Status  09/20/2021 209 20 - 320 mg/dL Final         Passed - K in normal range and within 180 days    Potassium  Date Value Ref Range Status  12/25/2023 4.6 3.5 - 5.3 mmol/L Final         Passed - Patient is not pregnant      Passed - Last BP in normal range    BP Readings from Last 1 Encounters:  12/25/23 138/74         Passed - Valid encounter within last 6 months    Recent Outpatient Visits           2 months ago Encounter for general adult medical examination with abnormal findings   [facility] [place], Angeline ORN, [place]

## 2024-03-05 ENCOUNTER — Encounter: Payer: Self-pay | Admitting: Internal Medicine

## 2024-03-19 ENCOUNTER — Other Ambulatory Visit: Payer: Self-pay | Admitting: Internal Medicine

## 2024-03-21 NOTE — Telephone Encounter (Signed)
 Requested medication (s) are due for refill today: Yes  Requested medication (s) are on the active medication list: Yes  Last refill:  02/09/24  Future visit scheduled: Yes  Notes to clinic:  Unable to refill per protocol, cannot delegate.      Requested Prescriptions  Pending Prescriptions Disp Refills   LORazepam  (ATIVAN ) 0.5 MG tablet [Pharmacy Med Name: LORAZEPAM  0.5 MG TABLET] 60 tablet 0    Sig: TAKE 1 TO 2 TABLETS BY MOUTH TWICE A DAY AS NEEDED. *NOT COVERED.*     Not Delegated - Psychiatry: Anxiolytics/Hypnotics 2 Failed - 03/21/2024 10:54 PM      Failed - This refill cannot be delegated      Failed - Urine Drug Screen completed in last 360 days      Passed - Patient is not pregnant      Passed - Valid encounter within last 6 months    Recent Outpatient Visits           2 months ago Encounter for general adult medical examination with abnormal findings   [facility] [place], Angeline ORN, NP

## 2024-03-30 ENCOUNTER — Other Ambulatory Visit: Payer: Self-pay | Admitting: Internal Medicine

## 2024-03-31 NOTE — Telephone Encounter (Signed)
 Requested Prescriptions  Pending Prescriptions Disp Refills   memantine  (NAMENDA  XR) 14 MG CP24 24 hr capsule [Pharmacy Med Name: MEMANTINE  HCL ER 14 MG CAPSULE] 90 capsule 1    Sig: TAKE 1 CAPSULE (14 MG TOTAL) BY MOUTH DAILY.     Neurology:  Alzheimer's Agents 2 Passed - 03/31/2024  8:13 AM      Passed - Cr in normal range and within 360 days    Creat  Date Value Ref Range Status  12/25/2023 1.13 0.70 - 1.22 mg/dL Final   Creatinine, Urine  Date Value Ref Range Status  09/20/2021 209 20 - 320 mg/dL Final         Passed - eGFR is 5 or above and within 360 days    GFR calc Af Amer  Date Value Ref Range Status  12/25/2019 >60 >60 mL/min Final   GFR, Estimated  Date Value Ref Range Status  02/14/2023 >60 >60 mL/min Final    Comment:    (NOTE) Calculated using the CKD-EPI Creatinine Equation (2021)    GFR  Date Value Ref Range Status  01/06/2020 59.40 (L) >60.00 mL/min Final   eGFR  Date Value Ref Range Status  12/25/2023 66 > OR = 60 mL/min/1.84m2 Final         Passed - Valid encounter within last 6 months    Recent Outpatient Visits           3 months ago Encounter for general adult medical examination with abnormal findings   [facility] [place], Angeline ORN, NP

## 2024-04-07 ENCOUNTER — Encounter: Payer: Self-pay | Admitting: Internal Medicine

## 2024-04-13 ENCOUNTER — Encounter: Payer: Self-pay | Admitting: Internal Medicine

## 2024-04-14 ENCOUNTER — Ambulatory Visit
Admission: RE | Admit: 2024-04-14 | Discharge: 2024-04-14 | Disposition: A | Discharge status: Home / Self Care | Source: Ambulatory Visit | Attending: Internal Medicine | Admitting: Internal Medicine

## 2024-04-14 ENCOUNTER — Other Ambulatory Visit: Admitting: Internal Medicine

## 2024-04-14 ENCOUNTER — Ambulatory Visit: Payer: Self-pay | Admitting: Internal Medicine

## 2024-04-14 ENCOUNTER — Encounter: Payer: Self-pay | Admitting: Internal Medicine

## 2024-04-14 DIAGNOSIS — M542 Cervicalgia: Secondary | ICD-10-CM

## 2024-04-14 DIAGNOSIS — M25512 Pain in left shoulder: Secondary | ICD-10-CM

## 2024-04-14 DIAGNOSIS — R0789 Other chest pain: Secondary | ICD-10-CM

## 2024-04-14 DIAGNOSIS — W19XXXA Unspecified fall, initial encounter: Secondary | ICD-10-CM

## 2024-04-14 DIAGNOSIS — M47812 Spondylosis without myelopathy or radiculopathy, cervical region: Secondary | ICD-10-CM | POA: Diagnosis not present

## 2024-04-14 DIAGNOSIS — Z23 Encounter for immunization: Secondary | ICD-10-CM

## 2024-04-14 DIAGNOSIS — M546 Pain in thoracic spine: Secondary | ICD-10-CM

## 2024-04-14 DIAGNOSIS — E118 Type 2 diabetes mellitus with unspecified complications: Secondary | ICD-10-CM

## 2024-04-14 DIAGNOSIS — R202 Paresthesia of skin: Secondary | ICD-10-CM

## 2024-04-14 DIAGNOSIS — J189 Pneumonia, unspecified organism: Secondary | ICD-10-CM | POA: Diagnosis not present

## 2024-04-14 LAB — POCT URINE DIPSTICK
Bilirubin, UA: NEGATIVE
Blood, UA: NEGATIVE
Glucose, UA: NEGATIVE mg/dL
Ketones, POC UA: NEGATIVE mg/dL
Leukocytes, UA: NEGATIVE
Nitrite, UA: NEGATIVE
POC PROTEIN,UA: NEGATIVE
Spec Grav, UA: 1.01 (ref 1.010–1.025)
Urobilinogen, UA: 0.2 U/dL
pH, UA: 7 (ref 5.0–8.0)

## 2024-04-14 MED ORDER — FLUCONAZOLE 150 MG PO TABS: ORAL_TABLET | ORAL | 0 refills | Status: DC

## 2024-04-14 MED ORDER — LEVOFLOXACIN 500 MG PO TABS: 500.0000 mg | ORAL_TABLET | Freq: Every day | ORAL | 0 refills | Status: AC

## 2024-04-14 MED ORDER — HYDROCODONE-ACETAMINOPHEN 7.5-325 MG PO TABS: 1.0000 | ORAL_TABLET | Freq: Four times a day (QID) | ORAL | 0 refills | Status: AC | PRN

## 2024-04-14 MED ORDER — CYCLOBENZAPRINE HCL 5 MG PO TABS: 5.0000 mg | ORAL_TABLET | Freq: Three times a day (TID) | ORAL | 0 refills | Status: DC | PRN

## 2024-04-14 NOTE — Progress Notes (Signed)
 Subjective:    Patient ID: Alexander Booth, male    DOB: [birth year], 78 y.o.   MRN: [id number]  HPI   Discussed the use of AI scribe software for clinical note transcription with the patient, who gave verbal consent to proceed.  Alexander Booth is an 78 year old male who presents with neck, back, chest wall and left shoulder pain following a fall.   Two weeks ago, he experienced a fall at home, resulting in pain in his neck, left shoulder,  upper chest, and upper back pain. He does not recall the details of the fall or hitting his head, but he was found lying sideways on the floor. No bleeding or bruising was observed on his head, and he did not experience headaches or vision problems post-fall. There were no signs of a concussion, although there was concern due to his memory loss regarding the incident.  He reports tingling pain radiating down his left arm, which worsens when he moves from laying on his left to his right side. The pain is described as 'tingling' and causes him to 'holler' when getting up or down. He has not observed any bruising on his body, but he experiences soreness in his neck, shoulder, and chest, and reports pain when breathing. The pain has slightly improved over the past two weeks but remains significant.  He has been using a heating pad for relief and has taken hydrocodone , which provided some pain relief. He is also on cyclobenzaprine , taken at night and in the morning, and gabapentin , two at nighttime. He has not been taking any other pain medications regularly.  He mentions increased coughing and spitting up phlegm, which may be contributing to his chest pain. No blood in urine or stool and no issues with his hip or knee.    Review of Systems     Past Medical History:  Diagnosis Date   Arthritis    COPD (chronic obstructive pulmonary disease) (HCC)    Depression    Dyspnea    Erectile dysfunction    He's had a hx of     Hypercholesterolemia    Hypertension    Essential   Lumbosacral disc disease    with chronic low back pain, has implanted nerve stimulator   Peripheral neuropathy    Presenile dementia    Type 2 diabetes mellitus (HCC) 2010   takes oral meds    Current Outpatient Medications  Medication Sig Dispense Refill   Accu-Chek Softclix Lancets lancets Check sugar 2 times daily. DX E11.8 (Patient not taking: Reported on 02/25/2024) 200 each 3   acetaminophen  (TYLENOL ) 500 MG tablet Take 1,000 mg by mouth every 6 (six) hours as needed for moderate pain.     Albuterol  Sulfate (PROAIR  RESPICLICK) 108 (90 Base) MCG/ACT AEPB Inhale 1-2 puffs into the lungs every 4 (four) hours as needed. (Patient not taking: Reported on 02/25/2024) 1 each 11   aspirin  EC 81 MG tablet Take 81 mg by mouth daily with breakfast.     atorvastatin  (LIPITOR) 40 MG tablet TAKE 1 TABLET EVERY DAY 90 tablet 3   Blood Glucose Monitoring Suppl (ACCU-CHEK AVIVA PLUS) w/Device KIT Check sugar 2 times daily. DX E11.8 (Patient not taking: Reported on 02/25/2024) 1 kit 12   BREO ELLIPTA  100-25 MCG/INH AEPB INHALE 1 PUFF BY MOUTH EVERY DAY (Patient not taking: Reported on 02/25/2024) 60 each 2   carbidopa -levodopa  (SINEMET  IR) 25-100 MG tablet Take 1 tablet by mouth 3 (three) times  daily at 7am, 11am, and 4pm. 270 tablet 1   Cholecalciferol (QC VITAMIN D3) 50 MCG (2000 UT) TABS Take 2,000 Units by mouth daily with lunch.     citalopram  (CELEXA ) 20 MG tablet TAKE 1 TABLET EVERY DAY 90 tablet 0   cyclobenzaprine  (FLEXERIL ) 5 MG tablet Take 5 mg by mouth 2 (two) times daily.     divalproex  (DEPAKOTE  ER) 500 MG 24 hr tablet TAKE 2 TABLETS EVERY DAY 180 tablet 1   donepezil  (ARICEPT ) 10 MG tablet Take 1 tablet (10 mg total) by mouth daily. 90 tablet 3   fluconazole  (DIFLUCAN ) 150 MG tablet 1 tablet p.o. every 3 days for fungal infection (Patient not taking: Reported on 02/25/2024) 3 tablet 0   gabapentin  (NEURONTIN ) 400 MG capsule TAKE 3 CAPSULES AT  BEDTIME AND MAY REPEAT DOSE ONE TIME IF NEEDED (Patient taking differently: 800 mg at bedtime. TAKE 2 CAPSULES AT BEDTIME AND MAY REPEAT DOSE ONE TIME IF NEEDED) 270 capsule 3   gentamicin  cream (GARAMYCIN ) 0.1 % Apply 1 Application topically 2 (two) times daily. (Patient not taking: Reported on 02/25/2024) 30 g 1   glucose blood (ACCU-CHEK AVIVA PLUS) test strip Check sugar 2 times daily. DX E11.8 (Patient not taking: Reported on 02/25/2024) 200 each 3   glucose blood (ACCU-CHEK GUIDE) test strip Use as instructed (Patient not taking: Reported on 02/25/2024) 100 each 12   HYDROcodone -acetaminophen  (NORCO) 7.5-325 MG tablet Take 1 tablet by mouth every 6 (six) hours as needed for pain. (Patient not taking: Reported on 02/25/2024) 120 tablet 0   lisinopril  (ZESTRIL ) 5 MG tablet TAKE 1 TABLET (5 MG TOTAL) BY MOUTH DAILY. 90 tablet 0   LORazepam  (ATIVAN ) 0.5 MG tablet TAKE 1 TO 2 TABLETS BY MOUTH TWICE A DAY AS NEEDED. *NOT COVERED.* 60 tablet 0   meloxicam  (MOBIC ) 15 MG tablet Take 1 tablet (15 mg total) by mouth daily with supper. (Patient not taking: Reported on 02/25/2024) 90 tablet 3   memantine  (NAMENDA  XR) 14 MG CP24 24 hr capsule TAKE 1 CAPSULE (14 MG TOTAL) BY MOUTH DAILY. 90 capsule 1   metFORMIN  (GLUCOPHAGE ) 500 MG tablet TAKE 1 TABLET TWICE DAILY WITH MEALS 180 tablet 3   mirtazapine  (REMERON ) 45 MG tablet TAKE 1 TABLET AT BEDTIME 90 tablet 0   nitroGLYCERIN  (NITROSTAT ) 0.4 MG SL tablet Place 0.4 mg under the tongue every 5 (five) minutes x 3 doses as needed for chest pain.     pantoprazole  (PROTONIX ) 20 MG tablet TAKE 1 TABLET BY MOUTH EVERY DAY (Patient not taking: Reported on 02/25/2024) 90 tablet 1   tamsulosin  (FLOMAX ) 0.4 MG CAPS capsule TAKE 1 CAPSULE BY MOUTH EVERY DAY 90 capsule 0   traZODone  (DESYREL ) 50 MG tablet Take 1-2 tablets (50-100 mg total) by mouth at bedtime. 180 tablet 1   vitamin C (ASCORBIC ACID ) 500 MG tablet Take 500 mg by mouth daily with lunch.     No current  facility-administered medications for this visit.    Allergies  Allergen Reactions   Codeine Hives and Itching    Family History  Problem Relation Age of Onset   Multiple myeloma Mother    Diabetes Mother    Diabetes Sister    Lymphoma Sister    Stroke Neg Hx     Social History   Socioeconomic History   Marital status: Married    Spouse name: Rock   Number of children: 3   Years of education: 12   Highest education level: 12th grade  Occupational History   Occupation: retired    Comment: retired  Tobacco Use   Smoking status: Every Day    Current packs/day: 1.00    Average packs/day: 1 pack/day for 40.0 years (40.0 ttl pk-yrs)    Types: Cigarettes   Smokeless tobacco: Never  Vaping Use   Vaping status: Never Used  Substance and Sexual Activity   Alcohol use: No    Alcohol/week: 0.0 standard drinks of alcohol   Drug use: No   Sexual activity: Not on file  Other Topics Concern   Not on file  Social History Narrative   Patient is right handed   Patient resides with wife,consumes 2-3 cups of caffeine daily   Social Drivers of Health   Financial Resource Strain: Low Risk  (12/24/2023)   Overall Financial Resource Strain (CARDIA)    Difficulty of Paying Living Expenses: Not hard at all  Food Insecurity: No Food Insecurity (12/24/2023)   Hunger Vital Sign    Worried About Running Out of Food in the Last Year: Never true    Ran Out of Food in the Last Year: Never true  Transportation Needs: No Transportation Needs (12/24/2023)   PRAPARE - Administrator, Civil Service (Medical): No    Lack of Transportation (Non-Medical): No  Physical Activity: Inactive (12/24/2023)   Exercise Vital Sign    Days of Exercise per Week: 0 days    Minutes of Exercise per Session: 0 min  Stress: No Stress Concern Present (12/24/2023)   [employer] of Occupational Health - Occupational Stress Questionnaire    Feeling of Stress : Only a little  Social Connections:  Moderately Isolated (12/24/2023)   Social Connection and Isolation Panel    Frequency of Communication with Friends and Family: More than three times a week    Frequency of Social Gatherings with Friends and Family: Twice a week    Attends Religious Services: Never    Database administrator or Organizations: No    Attends Banker Meetings: Never    Marital Status: Married  Catering manager Violence: Not At Risk (01/09/2023)   Humiliation, Afraid, Rape, and Kick questionnaire    Fear of Current or Ex-Partner: No    Emotionally Abused: No    Physically Abused: No    Sexually Abused: No     Constitutional: Patient reports fatigue.  Denies fever, malaise, headache or abrupt weight changes.  HEENT: Denies eye pain, eye redness, ear pain, ringing in the ears, wax buildup, runny nose, nasal congestion, bloody nose, or sore throat. Respiratory: Patient reports chronic cough and shortness of breath.  Denies difficulty breathing.   Cardiovascular: Patient reports episode of chest pain 4 days ago.  Denies chest tightness, palpitations or swelling in the hands or feet.  Gastrointestinal: Patient reports intermittent reflux.  Denies abdominal pain, bloating, constipation, diarrhea or blood in the stool.  GU: Patient reports urinary frequency, nocturia.  Denies urgency, pain with urination, burning sensation, blood in urine, odor or discharge. Musculoskeletal: Patient reports chronic back pain, acute neck pain, acute left shoulder pain, acute chest wall pain and difficulty with gait.  Denies decrease in range of motion, or joint swelling.  Skin: Denies redness, rashes, lesions or ulcercations.  Neurological: Patient reports paresthesia of left upper extremity, difficulty with memory and balance.  Denies dizziness, difficulty with speech or problems with coordination.  Psych: Patient has a history of anxiety.  Denies depression, SI/HI.  No other specific complaints in a complete review of  systems (except as listed in HPI above).  Objective:   Physical Exam There were no vitals taken for this visit.    Wt Readings from Last 3 Encounters:  02/25/24 160 lb (72.6 kg)  12/25/23 165 lb 6.4 oz (75 kg)  02/14/23 175 lb (79.4 kg)    General: Appears his stated age, chronically ill appearing, in NAD. Skin: Warm, dry and intact.  No bruising or abrasions noted. HEENT: Head: normal shape and size; Eyes: sclera white, no icterus, conjunctiva pink, PERRLA and EOMs intact;  Cardiovascular: Normal rate and rhythm with occasional ectopic beat. No murmur, rubs or gallops noted.  Pulmonary/Chest: Normal effort and diminished vesicular breath sounds on the right with coarse rhonchi noted in the left upper and left lower lobe. No respiratory distress. No wheezes, rales noted.  Abdomen: Soft and nontender.  Normal bowel sounds.  Musculoskeletal: Decreased flexion and extension of the cervical spine.  Rotation of the cervical spine.  No bony tenderness noted over the cervical spine but pain with palpation of the left left paracervical muscles.  He has bony tenderness noted over the thoracic spine.  Shoulder shrug equal.  Normal external rotation of left shoulder.  Decreased internal rotation of the left shoulder.  Negative drop can test on the left.  Strength 4/5 BUE.  Handgrips equal.  Generalized pain with palpation of the anterior chest wall, L >R however no bony deformity or step-off noted. Neurological: Alert and oriented. Coordination normal but slowed.     BMET    Component Value Date/Time   NA 139 12/25/2023 1000   NA 141 10/11/2014 1122   K 4.6 12/25/2023 1000   CL 102 12/25/2023 1000   CO2 29 12/25/2023 1000   GLUCOSE 172 (H) 12/25/2023 1000   BUN 18 12/25/2023 1000   BUN 11 10/11/2014 1122   CREATININE 1.13 12/25/2023 1000   CALCIUM  9.4 12/25/2023 1000   GFRNONAA >60 02/14/2023 0727   GFRAA >60 12/25/2019 0358    Lipid Panel     Component Value Date/Time   CHOL 104  12/25/2023 1000   TRIG 112 12/25/2023 1000   HDL 41 12/25/2023 1000   CHOLHDL 2.5 12/25/2023 1000   VLDL 75.6 (H) 04/27/2019 1116   LDLCALC 43 12/25/2023 1000    CBC    Component Value Date/Time   WBC 7.4 12/25/2023 1000   RBC 4.04 (L) 12/25/2023 1000   HGB 13.1 (L) 12/25/2023 1000   HCT 40.4 12/25/2023 1000   PLT 150 12/25/2023 1000   MCV 100.0 12/25/2023 1000   MCH 32.4 12/25/2023 1000   MCHC 32.4 12/25/2023 1000   RDW 11.6 12/25/2023 1000   RDW 13.5 10/11/2014 1122   LYMPHSABS 1.5 02/14/2023 0727   LYMPHSABS 1.3 10/11/2014 1122   MONOABS 0.5 02/14/2023 0727   EOSABS 0.1 02/14/2023 0727   EOSABS 0.0 10/11/2014 1122   BASOSABS 0.1 02/14/2023 0727   BASOSABS 0.0 10/11/2014 1122    Hgb A1C Lab Results  Component Value Date   HGBA1C 6.0 (H) 12/25/2023            Assessment & Plan:   Assessment and Plan    Acute pain in the left shoulder, neck, thoracic back, chest wall and left arm with paresthesia Acute pain with paresthesia post-fall, DDx include cervical strain, left shoulder strain, cervical radiculitis, compression fracture of the thoracic spine, rib fracture, chest contusion costochondritis. No imaging as it may not alter management. - Recommend naproxen 220 mg twice daily for one week. -  Increase cyclobenzaprine  10 mg to three times daily if tolerated. - Prescribe hydrocodone  7.5 mg every 6 hours as needed for pain management. - Order x-ray of cervical spine, thoracic spine, left shoulder, and ribs. - Urinalysis normal  Left lower lobe pneumonia Suspected pneumonia based on auscultation and productive cough. Chest and back pain may be related. - Prescribe Levaquin  500 milligrams daily x 7 days for pneumonia. - Prescribe Diflucan  150 mg p.o. times x 3 doses for potential yeast infection secondary to antibiotics.    Will follow-up in 5 months for follow-up with chronic conditions  Angeline Laura, NP

## 2024-04-14 NOTE — Patient Instructions (Signed)
 RICE Therapy for Routine Care of Injuries Many injuries can be cared for with rest, ice, compression, and elevation. This is also called RICE therapy. RICE therapy includes: Resting the injured body part. Putting ice on the injury. Putting pressure on the injury. This is also called compression. Raising the injured part. This is also called elevation. RICE therapy can help reduce pain and swelling. Supplies needed: Ice. Plastic bag. Towel. Elastic bandage. Pillow or pillows to raise the injured body part. How to care for your injury with RICE therapy Rest Try to rest the injured part of your body. You can go back to your normal activities when your health care provider says it's okay to do them and when you can do them without pain. Ask what things are safe for you to do. Some injuries heal better with early movement instead of resting. If you rest the injury too much, it may not heal as well. Ask your provider if you should do exercises to help your injury get better. Ice Putting ice on your injury can help to lessen swelling and pain. Do not apply ice directly to your skin. Use ice on as many days as told by your provider. If told, put ice on the area. Put ice in a plastic bag. Place a towel between your skin and the bag. Leave the ice on for 20 minutes, 2-3 times a day. If your skin turns bright red, take off the ice right away to prevent skin damage. The risk of damage is higher if you can't feel pain, heat, or cold.  Compression Put pressure, also called compression, on your injured area. This can be done with an elastic bandage. If this type of bandage has been put on your injury: Follow instructions on the package the bandage came in about how to use it. Do not wrap the bandage too tightly. Wrap the bandage more loosely if part of your body beyond the bandage looks blue, or is swollen, cold, painful, or loses feeling. Take off the bandage and put it on again every 3-4 hours or  as told by your provider. Call your provider if the bandage seems to make your injury worse.  Elevation Raise the injured area above the level of your heart while you're sitting or lying down. Use a pillow to support your injured area as needed. Follow these instructions at home: If your symptoms get worse or last a long time, make a follow-up appointment with your provider. You may need to have imaging tests, such as X-rays or an MRI. If you have imaging tests, ask how to get your results when they are ready. Contact a health care provider if: You keep having pain and swelling. Your symptoms get worse. Get help right away if: You have sudden, very bad pain at your injury or lower than your injury. You have tingling or numbness at your injury or lower than your injury, and it does not go away when you take the bandage off. This information is not intended to replace advice given to you by your health care provider. Make sure you discuss any questions you have with your health care provider. Document Revised: 04/10/2023 Document Reviewed: 09/23/2022 Elsevier Patient Education  2024 [employer].

## 2024-04-15 LAB — MICROALBUMIN / CREATININE URINE RATIO
Creatinine, Urine: 79 mg/dL (ref 20–320)
Microalb Creat Ratio: 22 mg/g{creat} (ref <30)
Microalb, Ur: 1.7 mg/dL

## 2024-04-29 ENCOUNTER — Other Ambulatory Visit: Payer: Self-pay | Admitting: Internal Medicine

## 2024-05-02 ENCOUNTER — Other Ambulatory Visit: Payer: Self-pay | Admitting: Internal Medicine

## 2024-05-03 NOTE — Telephone Encounter (Signed)
 Requested Prescriptions  Pending Prescriptions Disp Refills   citalopram  (CELEXA ) 20 MG tablet [Pharmacy Med Name: CITALOPRAM  HYDROBROMIDE 20 MG Oral Tablet] 90 tablet 0    Sig: TAKE 1 TABLET EVERY DAY     Psychiatry:  Antidepressants - SSRI Passed - 05/03/2024  1:51 PM      Passed - Valid encounter within last 6 months    Recent Outpatient Visits           4 months ago Encounter for general adult medical examination with abnormal findings   [facility] [place], Angeline ORN, NP               mirtazapine  (REMERON ) 45 MG tablet [Pharmacy Med Name: MIRTAZAPINE  45 MG Oral Tablet] 90 tablet 0    Sig: TAKE 1 TABLET AT BEDTIME     Psychiatry: Antidepressants - mirtazapine  Passed - 05/03/2024  1:51 PM      Passed - Valid encounter within last 6 months    Recent Outpatient Visits           4 months ago Encounter for general adult medical examination with abnormal findings   [place] [facility] [place], Angeline ORN, NP              Refused Prescriptions Disp Refills   memantine  (NAMENDA  XR) 14 MG CP24 24 hr capsule [Pharmacy Med Name: MEMANTINE  HYDROCHLORIDE ER 14 MG Oral Capsule Extended Release 24 Hour] 30 capsule 11    Sig: TAKE 1 CAPSULE EVERY DAY     Neurology:  Alzheimer's Agents 2 Passed - 05/03/2024  1:51 PM      Passed - Cr in normal range and within 360 days    Creat  Date Value Ref Range Status  12/25/2023 1.13 0.70 - 1.22 mg/dL Final   Creatinine, Urine  Date Value Ref Range Status  04/14/2024 79 20 - 320 mg/dL Final         Passed - eGFR is 5 or above and within 360 days    GFR calc Af Amer  Date Value Ref Range Status  12/25/2019 >60 >60 mL/min Final   GFR, Estimated  Date Value Ref Range Status  02/14/2023 >60 >60 mL/min Final    Comment:    (NOTE) Calculated using the CKD-EPI Creatinine Equation (2021)    GFR  Date Value Ref Range Status  01/06/2020 59.40 (L) >60.00 mL/min Final   eGFR  Date Value Ref  Range Status  12/25/2023 66 > OR = 60 mL/min/1.6m2 Final         Passed - Valid encounter within last 6 months    Recent Outpatient Visits           4 months ago Encounter for general adult medical examination with abnormal findings    [facility] [place], Angeline ORN, NP

## 2024-05-04 NOTE — Telephone Encounter (Signed)
 Requested Prescriptions  Pending Prescriptions Disp Refills   tamsulosin  (FLOMAX ) 0.4 MG CAPS capsule [Pharmacy Med Name: TAMSULOSIN  HCL 0.4 MG CAPSULE] 90 capsule 1    Sig: TAKE 1 CAPSULE BY MOUTH EVERY DAY     Urology: Alpha-Adrenergic Blocker Passed - 05/04/2024  1:51 PM      Passed - PSA in normal range and within 360 days    PSA  Date Value Ref Range Status  12/25/2023 0.85 < OR = 4.00 ng/mL Final    Comment:    The total PSA value from this assay system is  standardized against the WHO standard. The test  result will be approximately 20% lower when compared  to the equimolar-standardized total PSA (Beckman  Coulter). Comparison of serial PSA results should be  interpreted with this fact in mind. . This test was performed using the Siemens  chemiluminescent method. Values obtained from  different assay methods cannot be used interchangeably. PSA levels, regardless of value, should not be interpreted as absolute evidence of the presence or absence of disease.   06/01/2019 0.56 0.10 - 4.00 ng/ml Final    Comment:    Test performed using Access Hybritech PSA Assay, a parmagnetic partical, chemiluminecent immunoassay.         Passed - Last BP in normal range    BP Readings from Last 1 Encounters:  12/25/23 138/74         Passed - Valid encounter within last 12 months    Recent Outpatient Visits           4 months ago Encounter for general adult medical examination with abnormal findings   [place] [facility] [place], Angeline ORN, NP

## 2024-05-07 ENCOUNTER — Other Ambulatory Visit: Payer: Self-pay | Admitting: Internal Medicine

## 2024-05-10 NOTE — Telephone Encounter (Signed)
 Requested medications are due for refill today.  yes  Requested medications are on the active medications list.  yes  Last refill. 03/23/2024 #60 0 rf  Future visit scheduled.   yes  Notes to clinic.  Refill not delegated    Requested Prescriptions  Pending Prescriptions Disp Refills   LORazepam  (ATIVAN ) 0.5 MG tablet [Pharmacy Med Name: LORAZEPAM  0.5 MG TABLET] 60 tablet 0    Sig: TAKE 1 TO 2 TABLETS BY MOUTH TWICE A DAY AS NEEDED. *NOT COVERED.*     Not Delegated - Psychiatry: Anxiolytics/Hypnotics 2 Failed - 05/10/2024  2:47 PM      Failed - This refill cannot be delegated      Failed - Urine Drug Screen completed in last 360 days      Passed - Patient is not pregnant      Passed - Valid encounter within last 6 months    Recent Outpatient Visits           4 months ago Encounter for general adult medical examination with abnormal findings   [place] [facility] [place], Angeline ORN, NP

## 2024-05-29 ENCOUNTER — Other Ambulatory Visit: Payer: Self-pay | Admitting: Internal Medicine

## 2024-05-31 NOTE — Telephone Encounter (Signed)
 Requested Prescriptions  Pending Prescriptions Disp Refills   lisinopril  (ZESTRIL ) 5 MG tablet [Pharmacy Med Name: LISINOPRIL  5 MG TABLET] 90 tablet 0    Sig: TAKE 1 TABLET (5 MG TOTAL) BY MOUTH DAILY.     Cardiovascular:  ACE Inhibitors Passed - 05/31/2024  1:24 PM      Passed - Cr in normal range and within 180 days    Creat  Date Value Ref Range Status  12/25/2023 1.13 0.70 - 1.22 mg/dL Final   Creatinine, Urine  Date Value Ref Range Status  04/14/2024 79 20 - 320 mg/dL Final         Passed - K in normal range and within 180 days    Potassium  Date Value Ref Range Status  12/25/2023 4.6 3.5 - 5.3 mmol/L Final         Passed - Patient is not pregnant      Passed - Last BP in normal range    BP Readings from Last 1 Encounters:  12/25/23 138/74         Passed - Valid encounter within last 6 months    Recent Outpatient Visits           5 months ago Encounter for general adult medical examination with abnormal findings   [place] [facility] [place], Angeline ORN, [place]

## 2024-06-03 ENCOUNTER — Other Ambulatory Visit: Payer: Self-pay | Admitting: Internal Medicine

## 2024-06-04 ENCOUNTER — Other Ambulatory Visit: Payer: Self-pay | Admitting: Internal Medicine

## 2024-06-04 NOTE — Telephone Encounter (Signed)
 Requested Prescriptions  Pending Prescriptions Disp Refills   traZODone  (DESYREL ) 50 MG tablet [Pharmacy Med Name: TRAZODONE  50 MG TABLET] 180 tablet 1    Sig: TAKE 1-2 TABLETS BY MOUTH AT BEDTIME.     Psychiatry: Antidepressants - Serotonin Modulator Passed - 06/04/2024  2:40 PM      Passed - Valid encounter within last 6 months    Recent Outpatient Visits           5 months ago Encounter for general adult medical examination with abnormal findings   [facility] [place], Angeline ORN, NP

## 2024-06-06 NOTE — Telephone Encounter (Signed)
 Requested medications are due for refill today.  yes  Requested medications are on the active medications list.  yes  Last refill. 04/14/2024 #90 0 rf  Future visit scheduled.   yes  Notes to clinic.  Refill not delegated.    Requested Prescriptions  Pending Prescriptions Disp Refills   cyclobenzaprine  (FLEXERIL ) 5 MG tablet [Pharmacy Med Name: CYCLOBENZAPRINE  5 MG TABLET] 90 tablet 0    Sig: TAKE 1 TABLET BY MOUTH THREE TIMES A DAY AS NEEDED FOR MUSCLE SPASMS     Not Delegated - Analgesics:  Muscle Relaxants Failed - 06/06/2024 11:16 AM      Failed - This refill cannot be delegated      Passed - Valid encounter within last 6 months    Recent Outpatient Visits           5 months ago Encounter for general adult medical examination with abnormal findings   [place] [facility] [place], Angeline ORN, NP

## 2024-06-24 ENCOUNTER — Other Ambulatory Visit: Payer: Self-pay | Admitting: Internal Medicine

## 2024-06-26 NOTE — Telephone Encounter (Signed)
 Requested medications are due for refill today.  yes  Requested medications are on the active medications list.  yes  Last refill. 05/10/2024 #60 0 rf  Future visit scheduled.   yes  Notes to clinic.  Refill not delegated.    Requested Prescriptions  Pending Prescriptions Disp Refills   LORazepam  (ATIVAN ) 0.5 MG tablet [Pharmacy Med Name: LORAZEPAM  0.5 MG TABLET] 60 tablet 0    Sig: TAKE 1 TO 2 TABLETS BY MOUTH TWICE A DAY AS NEEDED. *NOT COVERED.*     Not Delegated - Psychiatry: Anxiolytics/Hypnotics 2 Failed - 06/26/2024  8:56 PM      Failed - This refill cannot be delegated      Failed - Urine Drug Screen completed in last 360 days      Failed - Valid encounter within last 6 months    Recent Outpatient Visits           6 months ago Encounter for general adult medical examination with abnormal findings   [place] [facility] [place], Angeline ORN, [place]              Passed - Patient is not pregnant

## 2024-06-28 ENCOUNTER — Ambulatory Visit: Admitting: Internal Medicine

## 2024-06-29 NOTE — Progress Notes (Unsigned)
 Subjective:    Patient ID: Alexander Booth, male    DOB: [birth year], 78 y.o.   MRN: [id number]  HPI  Pt presents to the clinic today for followup of chronic conditions.  HTN: His BP today is. He is taking lisinopril  as prescribed.  ECG from 01/2023 reviewed.   COPD: He denies chronic cough but does have shortness of breath.  He is not using breo and albuterol  as prescribed.  He is on oxygen  only as needed but does wear this nightly.  There are no PFTs on file.  He follows with pulmonology.   GERD: He reports intermittent breakthrough on pantoprazole  but does not take any OTC medications for this.  There is no upper GI on file.   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  He is not currently seeing a therapist.  He does not follow with neurology.   DM2: His last A1c was 6.%, 12/2023.  He is taking metformin  as prescribed.  He does not check his sugars.  He does not check his feet routinely.  His last [facility] exam was 08/2023, [facility].  Flu 03/2024.  Pneumovax 04/2018.  Prevnar 09/2022.  COVID Pfizer x5.   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  He is taking tylenol , cyclobenzaprine , and gabapentin  as prescribed. He takes hydrocodone  rarely. He no longer follows with pain management.   HLD with aortic atherosclerosis: His last LDL was 43, triglycerides 887, 12/2023.  He denies myalgias on atorvastatin .  He is taking aspirin  as well.  He tries to consume a low-fat diet.   Parkinson's: He reports tremors, stiffness, difficulty with balance and frequent falls.  He is taking carvidopa-levodopa  as prescribed.  He no longer follows with neurology   BPH: Mainly urinary frequency.  He is taking flomax  as prescribed.  He does not follow with urology.  Review of Systems     Past Medical History:  Diagnosis Date   Arthritis    COPD (chronic obstructive pulmonary  disease) (HCC)    Depression    Dyspnea    Erectile dysfunction    He's had a hx of    Hypercholesterolemia    Hypertension    Essential   Lumbosacral disc disease    with chronic low back pain, has implanted nerve stimulator   Peripheral neuropathy    Presenile dementia    Type 2 diabetes mellitus (HCC) 2010   takes oral meds    Current Outpatient Medications  Medication Sig Dispense Refill   acetaminophen  (TYLENOL ) 500 MG tablet Take 1,000 mg by mouth every 6 (six) hours as needed for moderate pain.     Albuterol  Sulfate (PROAIR  RESPICLICK) 108 (90 Base) MCG/ACT AEPB Inhale 1-2 puffs into the lungs every 4 (four) hours as needed. (Patient not taking: Reported on 02/25/2024) 1 each 11   aspirin  EC 81 MG tablet Take 81 mg by mouth daily with breakfast.     atorvastatin  (LIPITOR) 40 MG tablet TAKE 1 TABLET EVERY DAY 90 tablet 3   BREO ELLIPTA  100-25 MCG/INH AEPB INHALE 1 PUFF BY MOUTH EVERY DAY (Patient not taking: Reported on 02/25/2024) 60 each 2   carbidopa -levodopa  (SINEMET  IR) 25-100 MG tablet Take 1 tablet by mouth 3 (three) times daily at 7am, 11am, and 4pm. 270 tablet 1   Cholecalciferol (QC VITAMIN D3) 50 MCG (2000 UT) TABS Take 2,000 Units by mouth daily with lunch.     citalopram  (CELEXA ) 20  MG tablet TAKE 1 TABLET EVERY DAY 90 tablet 0   cyclobenzaprine  (FLEXERIL ) 5 MG tablet TAKE 1 TABLET BY MOUTH THREE TIMES A DAY AS NEEDED FOR MUSCLE SPASMS 90 tablet 0   divalproex  (DEPAKOTE  ER) 500 MG 24 hr tablet TAKE 2 TABLETS EVERY DAY 180 tablet 1   donepezil  (ARICEPT ) 10 MG tablet Take 1 tablet (10 mg total) by mouth daily. 90 tablet 3   fluconazole  (DIFLUCAN ) 150 MG tablet 1 tablet p.o. every 3 days for fungal infection 3 tablet 0   gabapentin  (NEURONTIN ) 400 MG capsule TAKE 3 CAPSULES AT BEDTIME AND MAY REPEAT DOSE ONE TIME IF NEEDED (Patient taking differently: 800 mg at bedtime. TAKE 2 CAPSULES AT BEDTIME AND MAY REPEAT DOSE ONE TIME IF NEEDED) 270 capsule 3   gentamicin  cream  (GARAMYCIN ) 0.1 % Apply 1 Application topically 2 (two) times daily. (Patient not taking: Reported on 02/25/2024) 30 g 1   glucose blood (ACCU-CHEK AVIVA PLUS) test strip Check sugar 2 times daily. DX E11.8 (Patient not taking: Reported on 02/25/2024) 200 each 3   glucose blood (ACCU-CHEK GUIDE) test strip Use as instructed (Patient not taking: Reported on 02/25/2024) 100 each 12   HYDROcodone -acetaminophen  (NORCO) 7.5-325 MG tablet Take 1 tablet by mouth every 6 (six) hours as needed. 20 tablet 0   lisinopril  (ZESTRIL ) 5 MG tablet TAKE 1 TABLET (5 MG TOTAL) BY MOUTH DAILY. 90 tablet 0   LORazepam  (ATIVAN ) 0.5 MG tablet TAKE 1 TO 2 TABLETS BY MOUTH TWICE A DAY AS NEEDED. *NOT COVERED.* 60 tablet 0   memantine  (NAMENDA  XR) 14 MG CP24 24 hr capsule TAKE 1 CAPSULE (14 MG TOTAL) BY MOUTH DAILY. 90 capsule 1   metFORMIN  (GLUCOPHAGE ) 500 MG tablet TAKE 1 TABLET TWICE DAILY WITH MEALS 180 tablet 3   mirtazapine  (REMERON ) 45 MG tablet TAKE 1 TABLET AT BEDTIME 90 tablet 0   nitroGLYCERIN  (NITROSTAT ) 0.4 MG SL tablet Place 0.4 mg under the tongue every 5 (five) minutes x 3 doses as needed for chest pain.     tamsulosin  (FLOMAX ) 0.4 MG CAPS capsule TAKE 1 CAPSULE BY MOUTH EVERY DAY 90 capsule 1   traZODone  (DESYREL ) 50 MG tablet TAKE 1-2 TABLETS BY MOUTH AT BEDTIME. 180 tablet 0   vitamin C (ASCORBIC ACID ) 500 MG tablet Take 500 mg by mouth daily with lunch.     No current facility-administered medications for this visit.    Allergies  Allergen Reactions   Codeine Hives and Itching    Family History  Problem Relation Age of Onset   Multiple myeloma Mother    Diabetes Mother    Diabetes Sister    Lymphoma Sister    Stroke Neg Hx     Social History   Socioeconomic History   Marital status: Married    Spouse name: Rock   Number of children: 3   Years of education: 12   Highest education level: 12th grade  Occupational History   Occupation: retired    Comment: retired  Tobacco Use   Smoking  status: Every Day    Current packs/day: 1.00    Average packs/day: 1 pack/day for 40.0 years (40.0 ttl pk-yrs)    Types: Cigarettes   Smokeless tobacco: Never  Vaping Use   Vaping status: Never Used  Substance and Sexual Activity   Alcohol use: No    Alcohol/week: 0.0 standard drinks of alcohol   Drug use: No   Sexual activity: Not on file  Other Topics Concern  Not on file  Social History Narrative   Patient is right handed   Patient resides with wife,consumes 2-3 cups of caffeine daily   Social Drivers of Health   Financial Resource Strain: Low Risk  (06/28/2024)   Overall Financial Resource Strain (CARDIA)    Difficulty of Paying Living Expenses: Not hard at all  Food Insecurity: No Food Insecurity (06/28/2024)   Hunger Vital Sign    Worried About Running Out of Food in the Last Year: Never true    Ran Out of Food in the Last Year: Never true  Transportation Needs: No Transportation Needs (06/28/2024)   PRAPARE - Administrator, Civil Service (Medical): No    Lack of Transportation (Non-Medical): No  Physical Activity: Inactive (06/28/2024)   Exercise Vital Sign    Days of Exercise per Week: 0 days    Minutes of Exercise per Session: Not on file  Stress: Stress Concern Present (06/28/2024)   [employer] of Occupational Health - Occupational Stress Questionnaire    Feeling of Stress: To some extent  Social Connections: Moderately Isolated (06/28/2024)   Social Connection and Isolation Panel    Frequency of Communication with Friends and Family: More than three times a week    Frequency of Social Gatherings with Friends and Family: Three times a week    Attends Religious Services: Never    Active Member of Clubs or Organizations: No    Attends Banker Meetings: Not on file    Marital Status: Married  Intimate Partner Violence: Not At Risk (01/09/2023)   Humiliation, Afraid, Rape, and Kick questionnaire    Fear of Current or Ex-Partner: No     Emotionally Abused: No    Physically Abused: No    Sexually Abused: No     Constitutional: Patient reports fatigue.  Denies fever, malaise, headache or abrupt weight changes.  HEENT: Denies eye pain, eye redness, ear pain, ringing in the ears, wax buildup, runny nose, nasal congestion, bloody nose, or sore throat. Respiratory: Patient reports chronic cough and shortness of breath.  Denies difficulty breathing.   Cardiovascular: Denies chest tightness, chest pain, palpitations or swelling in the hands or feet.  Gastrointestinal: Patient reports intermittent reflux.  Denies abdominal pain, bloating, constipation, diarrhea or blood in the stool.  GU: Patient reports urinary frequency, nocturia.  Denies urgency, pain with urination, burning sensation, blood in urine, odor or discharge. Musculoskeletal: Patient reports chronic back pain and difficulty with gait.  Denies decrease in range of motion, or joint swelling.  Skin: Denies redness, rashes, lesions or ulcercations.  Neurological: Patient reports difficulty with memory and balance.  Denies dizziness, difficulty with speech or problems with coordination.  Psych: Patient has a history of anxiety.  Denies depression, SI/HI.  No other specific complaints in a complete review of systems (except as listed in HPI above).  Objective:   Physical Exam There were no vitals taken for this visit.    Wt Readings from Last 3 Encounters:  02/25/24 160 lb (72.6 kg)  12/25/23 165 lb 6.4 oz (75 kg)  02/14/23 175 lb (79.4 kg)    General: Appears his stated age, chronically ill appearing, in NAD. Skin: Warm, dry and intact.  No bruising or abrasions noted. HEENT: Head: normal shape and size; Eyes: sclera white, no icterus, conjunctiva pink, PERRLA and EOMs intact;  Cardiovascular: Normal rate and rhythm with occasional ectopic beat. No murmur, rubs or gallops noted.  Pulmonary/Chest: Normal effort and diminished vesicular breath sounds on  the right  with coarse rhonchi noted in the left upper and left lower lobe. No respiratory distress. No wheezes, rales noted.  Abdomen: Soft and nontender.  Normal bowel sounds.  Musculoskeletal: Decreased flexion and extension of the cervical spine.  Rotation of the cervical spine.  No bony tenderness noted over the cervical spine but pain with palpation of the left left paracervical muscles.  He has bony tenderness noted over the thoracic spine.  Shoulder shrug equal.  Normal external rotation of left shoulder.  Decreased internal rotation of the left shoulder.  Negative drop can test on the left.  Strength 4/5 BUE.  Handgrips equal.  Generalized pain with palpation of the anterior chest wall, L >R however no bony deformity or step-off noted. Neurological: Alert and oriented. Coordination normal but slowed.     BMET    Component Value Date/Time   NA 139 12/25/2023 1000   NA 141 10/11/2014 1122   K 4.6 12/25/2023 1000   CL 102 12/25/2023 1000   CO2 29 12/25/2023 1000   GLUCOSE 172 (H) 12/25/2023 1000   BUN 18 12/25/2023 1000   BUN 11 10/11/2014 1122   CREATININE 1.13 12/25/2023 1000   CALCIUM  9.4 12/25/2023 1000   GFRNONAA >60 02/14/2023 0727   GFRAA >60 12/25/2019 0358    Lipid Panel     Component Value Date/Time   CHOL 104 12/25/2023 1000   TRIG 112 12/25/2023 1000   HDL 41 12/25/2023 1000   CHOLHDL 2.5 12/25/2023 1000   VLDL 75.6 (H) 04/27/2019 1116   LDLCALC 43 12/25/2023 1000    CBC    Component Value Date/Time   WBC 7.4 12/25/2023 1000   RBC 4.04 (L) 12/25/2023 1000   HGB 13.1 (L) 12/25/2023 1000   HCT 40.4 12/25/2023 1000   PLT 150 12/25/2023 1000   MCV 100.0 12/25/2023 1000   MCH 32.4 12/25/2023 1000   MCHC 32.4 12/25/2023 1000   RDW 11.6 12/25/2023 1000   RDW 13.5 10/11/2014 1122   LYMPHSABS 1.5 02/14/2023 0727   LYMPHSABS 1.3 10/11/2014 1122   MONOABS 0.5 02/14/2023 0727   EOSABS 0.1 02/14/2023 0727   EOSABS 0.0 10/11/2014 1122   BASOSABS 0.1 02/14/2023 0727    BASOSABS 0.0 10/11/2014 1122    Hgb A1C Lab Results  Component Value Date   HGBA1C 6.0 (H) 12/25/2023            Assessment & Plan:    RTC in 6 months for your annual exam  Angeline Laura, NP

## 2024-06-30 ENCOUNTER — Ambulatory Visit: Admitting: Internal Medicine

## 2024-06-30 ENCOUNTER — Encounter: Payer: Self-pay | Admitting: Internal Medicine

## 2024-06-30 VITALS — BP 124/72 | Ht 72.0 in | Wt 180.0 lb

## 2024-06-30 DIAGNOSIS — I700 Atherosclerosis of aorta: Secondary | ICD-10-CM

## 2024-06-30 DIAGNOSIS — G8929 Other chronic pain: Secondary | ICD-10-CM | POA: Insufficient documentation

## 2024-06-30 DIAGNOSIS — F02818 Dementia in other diseases classified elsewhere, unspecified severity, with other behavioral disturbance: Secondary | ICD-10-CM

## 2024-06-30 DIAGNOSIS — I2510 Atherosclerotic heart disease of native coronary artery without angina pectoris: Secondary | ICD-10-CM

## 2024-06-30 DIAGNOSIS — E785 Hyperlipidemia, unspecified: Secondary | ICD-10-CM

## 2024-06-30 DIAGNOSIS — J432 Centrilobular emphysema: Secondary | ICD-10-CM

## 2024-06-30 DIAGNOSIS — G20B2 Parkinson's disease with dyskinesia, with fluctuations: Secondary | ICD-10-CM

## 2024-06-30 DIAGNOSIS — K219 Gastro-esophageal reflux disease without esophagitis: Secondary | ICD-10-CM | POA: Diagnosis not present

## 2024-06-30 DIAGNOSIS — E1140 Type 2 diabetes mellitus with diabetic neuropathy, unspecified: Secondary | ICD-10-CM

## 2024-06-30 DIAGNOSIS — F03918 Unspecified dementia, unspecified severity, with other behavioral disturbance: Secondary | ICD-10-CM

## 2024-06-30 DIAGNOSIS — N401 Enlarged prostate with lower urinary tract symptoms: Secondary | ICD-10-CM | POA: Diagnosis not present

## 2024-06-30 DIAGNOSIS — I152 Hypertension secondary to endocrine disorders: Secondary | ICD-10-CM | POA: Diagnosis not present

## 2024-06-30 DIAGNOSIS — E1169 Type 2 diabetes mellitus with other specified complication: Secondary | ICD-10-CM | POA: Diagnosis not present

## 2024-06-30 DIAGNOSIS — M545 Low back pain, unspecified: Secondary | ICD-10-CM

## 2024-06-30 DIAGNOSIS — E1159 Type 2 diabetes mellitus with other circulatory complications: Secondary | ICD-10-CM

## 2024-06-30 DIAGNOSIS — E1142 Type 2 diabetes mellitus with diabetic polyneuropathy: Secondary | ICD-10-CM

## 2024-06-30 DIAGNOSIS — G479 Sleep disorder, unspecified: Secondary | ICD-10-CM

## 2024-06-30 DIAGNOSIS — E118 Type 2 diabetes mellitus with unspecified complications: Secondary | ICD-10-CM

## 2024-06-30 DIAGNOSIS — J449 Chronic obstructive pulmonary disease, unspecified: Secondary | ICD-10-CM | POA: Diagnosis not present

## 2024-06-30 DIAGNOSIS — Z7984 Long term (current) use of oral hypoglycemic drugs: Secondary | ICD-10-CM | POA: Diagnosis not present

## 2024-06-30 NOTE — Assessment & Plan Note (Signed)
 Avoid foods that trigger reflux No longer taking pantoprazole  but using OTC medication

## 2024-06-30 NOTE — Assessment & Plan Note (Signed)
 Continue tylenol  OTC, cyclobenzaprine  5 mg 3 times daily as needed and gabapentin  1200 mg at bedtime Encouraged regular stretching Will obtain MRI left shoulder for further evaluation

## 2024-06-30 NOTE — Assessment & Plan Note (Signed)
 Continue tylenol  OTC, cyclobenzaprine  5 mg 3 times daily as needed and gabapentin  1200 mg at bedtime Encouraged regular stretching Will obtain MRI cervical spine for further evaluation

## 2024-06-30 NOTE — Assessment & Plan Note (Signed)
 His daughter is considering increasing his carbidopa -levodopa  to 25-100 mg 2 tablets 3 times daily Will monitor

## 2024-06-30 NOTE — Assessment & Plan Note (Signed)
 C-Met and lipid profile today Encouraged him to consume a low-fat diet Continue atorvastatin  40 mg daily

## 2024-06-30 NOTE — Assessment & Plan Note (Signed)
 Discussed importance of good diabetic control Continue gabapentin  1200 mg at bedtime He follows with podiatry for foot exams

## 2024-06-30 NOTE — Assessment & Plan Note (Signed)
 Continue tamsulosin  0.4 mg daily Will monitor

## 2024-06-30 NOTE — Assessment & Plan Note (Signed)
 He is not taking Breo or albuterol  as prescribed, will discontinue due to nonuse Continue oxygen  as needed Encourage smoking cessation

## 2024-06-30 NOTE — Assessment & Plan Note (Signed)
 Continue table probiotics divalproex  000 mg daily, donepezil  10 mg daily, memantine  14 mg daily, citalopram  20 mg daily, lorazepam  0.5 mg twice daily as needed, trazodone  100 mg nightly and mirtazapine  45 mg nightly. Will monitor

## 2024-06-30 NOTE — Assessment & Plan Note (Signed)
 He is not taking lisinopril  5 mg as prescribed C-Met today Reinforced DASH diet

## 2024-06-30 NOTE — Assessment & Plan Note (Signed)
 No angina C-Met and lipid profile today He is not taking lisinopril  5 mg daily as prescribed Continue atorvastatin  40 mg and aspirin  81 mg daily

## 2024-06-30 NOTE — Assessment & Plan Note (Signed)
 Continue trazodone  100 mg and mirtazapine  45 mg at bedtime Will monitor

## 2024-06-30 NOTE — Assessment & Plan Note (Signed)
 C-Met and lipid profile today Encouraged to consume a low-fat diet Continue atorvastatin  40 mg and aspirin  81 mg daily

## 2024-06-30 NOTE — Patient Instructions (Signed)

## 2024-06-30 NOTE — Assessment & Plan Note (Signed)
 Continue tylenol  OTC, cyclobenzaprine  5 mg 3 times daily as needed and gabapentin  1200 mg at bedtime Encouraged regular stretching

## 2024-06-30 NOTE — Assessment & Plan Note (Signed)
 A1c today Urine microalbumin has been checked within the last year Encouraged him to consume a low-carb diet Continue metformin  500 mg at bedtime Encourage routine eye exam He follows with podiatry for foot exams Immunizations UTD

## 2024-07-01 ENCOUNTER — Ambulatory Visit: Payer: Self-pay | Admitting: Internal Medicine

## 2024-07-01 DIAGNOSIS — G8929 Other chronic pain: Secondary | ICD-10-CM

## 2024-07-01 LAB — LIPID PANEL
Cholesterol: 120 mg/dL (ref <200)
HDL: 42 mg/dL (ref >=40)
LDL Cholesterol (Calc): 53 mg/dL
Non-HDL Cholesterol (Calc): 78 mg/dL (ref <130)
Total CHOL/HDL Ratio: 2.9 (calc) (ref <5.0)
Triglycerides: 173 mg/dL — ABNORMAL HIGH (ref <150)

## 2024-07-01 LAB — COMPREHENSIVE METABOLIC PANEL WITH GFR
AG Ratio: 1.4 (calc) (ref 1.0–2.5)
ALT: <3 U/L — ABNORMAL LOW (ref 9–46)
AST: 11 U/L (ref 10–35)
Albumin: 4 g/dL (ref 3.6–5.1)
Alkaline phosphatase (APISO): 80 U/L (ref 35–144)
BUN: 12 mg/dL (ref 7–25)
CO2: 33 mmol/L — ABNORMAL HIGH (ref 20–32)
Calcium: 9.2 mg/dL (ref 8.6–10.3)
Chloride: 100 mmol/L (ref 98–110)
Creat: 1.08 mg/dL (ref 0.70–1.22)
Globulin: 2.9 g/dL (ref 1.9–3.7)
Glucose, Bld: 152 mg/dL — ABNORMAL HIGH (ref 65–99)
Potassium: 5 mmol/L (ref 3.5–5.3)
Sodium: 140 mmol/L (ref 135–146)
Total Bilirubin: 0.3 mg/dL (ref 0.2–1.2)
Total Protein: 6.9 g/dL (ref 6.1–8.1)
eGFR: 69 mL/min/1.73m2 (ref >=60)

## 2024-07-01 LAB — CBC
HCT: 39.9 % (ref 39.4–51.1)
Hemoglobin: 13.1 g/dL — ABNORMAL LOW (ref 13.2–17.1)
MCH: 31.7 pg (ref 27.0–33.0)
MCHC: 32.8 g/dL (ref 31.6–35.4)
MCV: 96.6 fL (ref 81.4–101.7)
MPV: 11.4 fL (ref 7.5–12.5)
Platelets: 142 Thousand/uL (ref 140–400)
RBC: 4.13 Million/uL — ABNORMAL LOW (ref 4.20–5.80)
RDW: 12.3 % (ref 11.0–15.0)
WBC: 7 Thousand/uL (ref 3.8–10.8)

## 2024-07-01 LAB — HEMOGLOBIN A1C
Hgb A1c MFr Bld: 7.2 % — ABNORMAL HIGH (ref <5.7)
Mean Plasma Glucose: 160 mg/dL
eAG (mmol/L): 8.9 mmol/L

## 2024-07-15 ENCOUNTER — Other Ambulatory Visit: Payer: Self-pay | Admitting: Internal Medicine

## 2024-07-16 NOTE — Telephone Encounter (Signed)
 Requested Prescriptions  Pending Prescriptions Disp Refills   mirtazapine  (REMERON ) 45 MG tablet [Pharmacy Med Name: MIRTAZAPINE  45 MG Oral Tablet] 90 tablet 0    Sig: TAKE 1 TABLET AT BEDTIME     Psychiatry: Antidepressants - mirtazapine  Passed - 07/16/2024  4:28 PM      Passed - Valid encounter within last 6 months    Recent Outpatient Visits           2 weeks ago Chronic neck pain   [place] [facility] [place], Angeline ORN, NP   6 months ago Encounter for general adult medical examination with abnormal findings   [place] [facility] [place], Angeline ORN, NP               citalopram  (CELEXA ) 20 MG tablet [Pharmacy Med Name: CITALOPRAM  HYDROBROMIDE 20 MG Oral Tablet] 90 tablet 0    Sig: TAKE 1 TABLET EVERY DAY     Psychiatry:  Antidepressants - SSRI Passed - 07/16/2024  4:28 PM      Passed - Valid encounter within last 6 months    Recent Outpatient Visits           2 weeks ago Chronic neck pain   [place] [facility] [place], Angeline ORN, NP   6 months ago Encounter for general adult medical examination with abnormal findings   [place] [facility] [place], Angeline ORN, NP

## 2024-07-25 ENCOUNTER — Other Ambulatory Visit: Payer: Self-pay | Admitting: Internal Medicine

## 2024-07-27 NOTE — Telephone Encounter (Signed)
 Requested medication (s) are due for refill today: yes  Requested medication (s) are on the active medication list: yes  Last refill:  06/07/24  Future visit scheduled: {Yes  Notes to clinic:  Unable to refill per protocol, cannot delegate.        Requested Prescriptions  Pending Prescriptions Disp Refills   cyclobenzaprine  (FLEXERIL ) 5 MG tablet [Pharmacy Med Name: CYCLOBENZAPRINE  5 MG TABLET] 90 tablet 0    Sig: TAKE 1 TABLET BY MOUTH THREE TIMES A DAY AS NEEDED FOR MUSCLE SPASMS     Not Delegated - Analgesics:  Muscle Relaxants Failed - 07/27/2024  9:41 AM      Failed - This refill cannot be delegated      Passed - Valid encounter within last 6 months    Recent Outpatient Visits           3 weeks ago Chronic neck pain   [place] [facility] [place], Angeline ORN, NP   7 months ago Encounter for general adult medical examination with abnormal findings   [place] [facility] [place], Angeline ORN, NP

## 2024-08-05 ENCOUNTER — Ambulatory Visit
Admission: RE | Admit: 2024-08-05 | Discharge: 2024-08-05 | Disposition: A | Source: Ambulatory Visit | Attending: Internal Medicine | Admitting: Internal Medicine

## 2024-08-05 DIAGNOSIS — G8929 Other chronic pain: Secondary | ICD-10-CM

## 2024-08-27 ENCOUNTER — Other Ambulatory Visit: Payer: Self-pay | Admitting: Internal Medicine

## 2024-08-27 ENCOUNTER — Encounter: Payer: Self-pay | Admitting: Internal Medicine

## 2024-08-27 DIAGNOSIS — G20A1 Parkinson's disease without dyskinesia, without mention of fluctuations: Secondary | ICD-10-CM

## 2024-08-27 MED ORDER — DIVALPROEX SODIUM ER 500 MG PO TB24: ORAL_TABLET | ORAL | 1 refills | Status: AC

## 2024-08-27 MED ORDER — CARBIDOPA-LEVODOPA 25-100 MG PO TABS: 1.0000 | ORAL_TABLET | Freq: Three times a day (TID) | ORAL | 1 refills | Status: AC

## 2024-12-28 ENCOUNTER — Encounter: Admitting: Internal Medicine
